# Patient Record
Sex: Male | Born: 1960
Health system: Southern US, Community
[De-identification: ages and names within clinical notes are randomized; demographics above are authoritative.]

## PROBLEM LIST (undated history)

## (undated) DIAGNOSIS — G839 Paralytic syndrome, unspecified: Secondary | ICD-10-CM

## (undated) DIAGNOSIS — I639 Cerebral infarction, unspecified: Secondary | ICD-10-CM

## (undated) DIAGNOSIS — I10 Essential (primary) hypertension: Secondary | ICD-10-CM

## (undated) DIAGNOSIS — E7800 Pure hypercholesterolemia, unspecified: Secondary | ICD-10-CM

## (undated) DIAGNOSIS — E119 Type 2 diabetes mellitus without complications: Secondary | ICD-10-CM

## (undated) DIAGNOSIS — I6529 Occlusion and stenosis of unspecified carotid artery: Secondary | ICD-10-CM

## (undated) HISTORY — DX: Pure hypercholesterolemia, unspecified: E78.00

## (undated) HISTORY — DX: Cerebral infarction, unspecified: I63.9

## (undated) HISTORY — DX: Type 2 diabetes mellitus without complications: E11.9

## (undated) HISTORY — DX: Essential (primary) hypertension: I10

## (undated) HISTORY — PX: TONSILLECTOMY: SUR1361

## (undated) HISTORY — DX: Occlusion and stenosis of unspecified carotid artery: I65.29

## (undated) NOTE — *Deleted (*Deleted)
Physician Discharge Summary  Patient ID: Lain Tetterton MRN: [id number] DOB/AGE: [birth year] 82 y.o.  Admit date: 11/21/2019 Discharge date: 12/15/2019  Discharge Diagnoses:  Principal Problem:   Left middle cerebral artery stroke [facility]) Active Problems:   Malnutrition of moderate degree   Tracheostomy dependence (HCC) DVT prophylaxis Pain management Dysphagia Hypertension Hyperlipidemia Acute on chronic respiratory failure status post tracheostomy Urinary retention Constipation Enterococcus UTI Diabetes mellitus  Discharged Condition: Stable  Significant Diagnostic Studies: DG Chest 2 View  Result Date: 12/09/2019 CLINICAL DATA:  CVA with concern for potential aspiration EXAM: CHEST - 2 VIEW COMPARISON:  November 13, 2019 FINDINGS: Tracheostomy no longer appreciable. Lungs are clear. Heart size and pulmonary vascularity are normal. No adenopathy. No bone lesions. IMPRESSION: Lungs clear.  Cardiac silhouette normal. Electronically Signed   By: Bretta Bang III M.D.   On: 12/09/2019 08:32   DG Abd 1 View  Result Date: 12/09/2019 CLINICAL DATA:  Nausea and vomiting. EXAM: ABDOMEN - 1 VIEW COMPARISON:  11/11/2019 FINDINGS: There is no evidence for gaseous bowel dilation to suggest obstruction. Gastrostomy tube again noted. Gas in the medial right upper quadrant is likely in the distal stomach/duodenum, but free air under the liver on a supine film could have this appearance. Prominent stool volume. Visualized bony anatomy unremarkable. IMPRESSION: 1. Gas in the medial right upper quadrant likely in the distal stomach/duodenum although free air under the liver on a supine film could have this appearance. CT scan of the abdomen and pelvis could be performed to further evaluate as clinically warranted. 2. Prominent left colonic stool volume. Electronically Signed   By: Kennith Center M.D.   On: 12/09/2019 06:29   DG Chest Port 1 View  Result Date: 12/13/2019 CLINICAL DATA:  66 year old  male with acute respiratory distress. EXAM: PORTABLE CHEST 1 VIEW COMPARISON:  Chest radiograph dated 12/09/2019. FINDINGS: Mild diffuse interstitial coarsening may be chronic or mild edema. No focal consolidation, pleural effusion, pneumothorax. The cardiac silhouette is within limits. Atherosclerotic calcification of the aortic arch. No acute osseous pathology. IMPRESSION: No focal consolidation. Electronically Signed   By: Elgie Collard M.D.   On: 12/13/2019 18:07    Labs:  Basic Metabolic Panel: Recent Labs  Lab 12/09/19 0758 12/11/19 1322 12/12/19 0545 12/14/19 0643  NA 144 141 137 137  K 3.6 3.4* 3.1* 3.4*  CL 103 101 100 101  CO2 28 29 27 26   GLUCOSE 175* 153* 108* 100*  BUN 41* 41* 35* 25*  CREATININE 1.75* 1.50* 1.27* 1.14  CALCIUM 9.1 8.6* 8.4* 8.5*    CBC: Recent Labs  Lab 12/09/19 0758 12/10/19 0607  WBC 11.5* 7.5  HGB 8.9* 9.4*  HCT 27.7* 28.7*  MCV 93.3 94.1  PLT 220 217    CBG: Recent Labs  Lab 12/14/19 1550 12/14/19 1838 12/14/19 2030 12/14/19 2358 12/15/19 0426  GLUCAP 109* 148* 166* 173* 111*    Brief HPI:   Joss Mcdill is a 25 y.o. right-handed male with history of diabetes mellitus hypertension hyperlipidemia left MCA infarction April 2021 while living in May 2021 underwent rehabilitation at [facility] with residual aphasia and right-sided weakness as well as left CEA July 2021 who resides presently assisted living facility.  Presented 10/10/2019 with seizure-like activity requiring intubation.  CT and MRI showed large territory chronic hemorrhage infarct in the left frontal lobe.  Ill-defined hyperintensities throughout the left basal ganglia and thalamus.  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  Patient maintained on aspirin and Plavix for CVA prophylaxis.  Keppra Dilantin and Vimpat ordered for seizure disorder.   Patient did initially require intubation extubated 10/14/2019 however continued to have desaturations into the 80s 15 L nonrebreather mask requiring reintubation and tracheostomy performed 10/22/2019 presently with a #6 cuffless trach as of 11/02/2019.  Currently n.p.o. gastrostomy tube placed 11/10/2019 per Dr. Loreta Ave of interventional radiology.  Bouts of urine retention requiring Urecholine.  Therapy evaluations completed and patient was admitted for a comprehensive rehab program   Hospital Course: Rhylen Shaheen was admitted to rehab 11/21/2019 for inpatient therapies to consist of PT, ST and OT at least three hours five days a week. Past admission physiatrist, therapy team and rehab RN have worked together to provide customized collaborative inpatient rehab.  Pertaining to patient's left MCA distribution infarction as well as history of left MCA remained stable he continued on aspirin and Plavix therapy.  Patient would need follow-up neurology services [place]  SCDs for DVT prophylaxis.  Pain managed with use of scheduled baclofen for spasticity as well as Neurontin every 8 hours.  His baclofen was later discontinued due to some sedation.  Seizure prophylaxis with the use of Dilantin, Keppra 1000 mg twice daily and Vimpat 150 mg twice daily.  There was an episode of lethargy noted 12/13/2019 patient did respond to sternal rub there was some question of possible seizure his Dilantin was adjusted.Marland Kitchen  Dysphagia n.p.o. gastrostomy tube 11/10/2019 per interventional radiology patient tolerating bolus tube feeds after adjustments due to some bouts of nausea with constipation and the addition of Reglan.  Blood pressure controlled with Cardura 6 mg daily as well as Norvasc 10 mg daily and hydralazine 50 mg every 8 hours.  Mood stabilization with scheduled Lexapro 5 mg nightly as well as Seroquel 50 mg nightly and Xanax 0.25 mg 3 times daily as needed.  Hospital course bouts of urinary retention  improved he did continue on Urecholine he was also being treated for Enterococcus UTI completing course of Amoxil and remained on in and out catheterizations as needed keep volumes less than 500 cc.  Acute on chronic respiratory failure tracheostomy decannulated tolerating well monitoring oxygen saturations.  Lipitor for hyperlipidemia.  Blood sugars controlled continue on NovoLog 6 times daily as well as scheduled Lantus insulin with hemoglobin A1c 7.2   Blood pressures were monitored on TID basis and soft and monitored  Diabetes has been monitored with ac/hs CBG checks and SSI was use prn for tighter BS control.    Rehab course: During patient's stay in rehab weekly team conferences were held to monitor patient's progress, set goals and discuss barriers to discharge. At admission, patient required min mod assist 90 feet to person hand-held assist moderate assist sit to stand.  Moderate assist upper body bathing mod assist lower body bathing mod assist upper body dressing moderate assist lower body dressing  Physical exam.  Blood pressure 153/64 pulse 84 temperature 98 respirations 17 oxygen saturations 100% room air Constitutional.  No acute distress HEENT Head.  Normocephalic and atraumatic Cardiac regular rate rhythm without extra sounds or murmur heard Neck.  Supple nontender no JVD trach stoma site healing Respiratory effort normal no respiratory distress without wheeze Abdomen.  Soft nontender positive bowel sounds gastrostomy tube in place Neurologic.  Cranial nerves II through XII intact motor strength 3/5 right deltoid bicep tricep grip hip flexor knee extensor ankle dorsi flexion 5/5 in left deltoid bicep tricep  grip hip flexors knee extensors ankle dorsi plantarflexion.  He/  has had improvement in activity tolerance, balance, postural control as well as ability to compensate for deficits. He/ has had improvement in functional use RUE/LUE  and RLE/LLE as well as improvement in  awareness.  Supine to sit with supervision.  Patient doffed right hand splint provides max assist for donning paper scrub pants and top socks shoes and AFO.  Patient provided cueing for compensatory dressing methods.  Demonstrates posterior loss of balance sitting and standing without lateral upper extremity support.  Ambulate 7 feet rolling walker.  He can increase his ambulation up to 50 feet.  Patient demonstrates good foot clearance.  He needs assist for ADLs and monitoring of balance.  Patient was discharged out of state to be closer with family       Disposition: Discharged to outside facility    Diet:NPO. Osmolite 237 mL 6 times daily by tube Free water 150 mL every 4 hours per tube  Medications at discharge 1.  Tylenol as needed 2.  Xanax 0.25 mg 3 times daily by tube as needed 3.  Norvasc 10 mg daily by tube 4.  Aspirin 81 mg p.o. daily by tube 5.  Lipitor 80 mg p.o. daily by tube 8.  Urecholine 50 mg 3 times daily by tube 9.  Amoxil 500 mg every 8 hours completing course 10.  Plavix 75 mg daily by tube 11.  Cardura 6 mg daily by tube 12.  Lexapro 5 mg daily nightly by tube 13.  Pepcid 10 mg daily by tube 14.  Neurontin 200 mg every 8 hours by tube 15.  Hydralazine 50 mg every 6 hours by tube 16.  NovoLog 3 units 6 times daily 17.  Lantus insulin 5 units twice daily 18.  Vimpat 150 mg twice daily by tube 19.  Keppra 1000 mg twice daily by tube 20.  Reglan 5 mg every 8 hours by tube 21.  Dilantin 125 mg / 5 mL suspension 15 mL (375mg ) and 8 mL(200mg ) in the evening 22.  Seroquel 50 mg nightly by tube 23.  Scopolamine patch change every 72 hours 24.  Senokot 5 mL twice daily by tube 25.KDUR 10 meq daily per tube  30-35 minutes were spent completing discharge summary and discharge planning  Special Instructions:  Discharge Instructions    Ambulatory referral to Neurology   Complete by: As directed    An appointment is requested in approximately 4 weeks left MCA  distribution infarction       Follow-up Information    , MD Follow up.   Specialty: Physical Medicine and Rehabilitation Why: No follow-up needed as patient is leaving the area on discharge Contact information: [address] [place] [phone]        [phone], MD Follow up.   Specialty: Pulmonary Disease Why: Call for appointment Contact information: [address] [place] [phone]        [phone], DO Follow up.   Specialties: Interventional Radiology, Radiology Why: Call for appointment Contact information: [address] [place] [address]               Signed: [phone] Nimco Bivens 12/15/2019, 5:07 AM

## (undated) NOTE — *Deleted (*Deleted)
Resting in bed in no acute distress, denies pain

## (undated) NOTE — *Deleted (*Deleted)
Physician Discharge Summary  Patient ID: Corey Bennett MRN: [id number] DOB/AGE: [birth year] 48 y.o.  Admit date: 11/21/2019 Discharge date: 12/15/2019  Discharge Diagnoses:  Principal Problem:   Left middle cerebral artery stroke [facility]) Active Problems:   Malnutrition of moderate degree   Tracheostomy dependence (HCC) DVT prophylaxis Pain management Dysphagia Hypertension Hyperlipidemia Acute on chronic respiratory failure status post tracheostomy Urinary retention Constipation Enterococcus UTI Diabetes mellitus  Discharged Condition: Stable  Significant Diagnostic Studies: DG Chest 2 View  Result Date: 12/09/2019 CLINICAL DATA:  CVA with concern for potential aspiration EXAM: CHEST - 2 VIEW COMPARISON:  November 13, 2019 FINDINGS: Tracheostomy no longer appreciable. Lungs are clear. Heart size and pulmonary vascularity are normal. No adenopathy. No bone lesions. IMPRESSION: Lungs clear.  Cardiac silhouette normal. Electronically Signed   By: Bretta Bang III M.D.   On: 12/09/2019 08:32   DG Abd 1 View  Result Date: 12/09/2019 CLINICAL DATA:  Nausea and vomiting. EXAM: ABDOMEN - 1 VIEW COMPARISON:  11/11/2019 FINDINGS: There is no evidence for gaseous bowel dilation to suggest obstruction. Gastrostomy tube again noted. Gas in the medial right upper quadrant is likely in the distal stomach/duodenum, but free air under the liver on a supine film could have this appearance. Prominent stool volume. Visualized bony anatomy unremarkable. IMPRESSION: 1. Gas in the medial right upper quadrant likely in the distal stomach/duodenum although free air under the liver on a supine film could have this appearance. CT scan of the abdomen and pelvis could be performed to further evaluate as clinically warranted. 2. Prominent left colonic stool volume. Electronically Signed   By: Kennith Center M.D.   On: 12/09/2019 06:29   DG Chest Port 1 View  Result Date: 12/13/2019 CLINICAL DATA:  73 year old  male with acute respiratory distress. EXAM: PORTABLE CHEST 1 VIEW COMPARISON:  Chest radiograph dated 12/09/2019. FINDINGS: Mild diffuse interstitial coarsening may be chronic or mild edema. No focal consolidation, pleural effusion, pneumothorax. The cardiac silhouette is within limits. Atherosclerotic calcification of the aortic arch. No acute osseous pathology. IMPRESSION: No focal consolidation. Electronically Signed   By: Elgie Collard M.D.   On: 12/13/2019 18:07    Labs:  Basic Metabolic Panel: Recent Labs  Lab 12/09/19 0758 12/11/19 1322 12/12/19 0545 12/14/19 0643  NA 144 141 137 137  K 3.6 3.4* 3.1* 3.4*  CL 103 101 100 101  CO2 28 29 27 26   GLUCOSE 175* 153* 108* 100*  BUN 41* 41* 35* 25*  CREATININE 1.75* 1.50* 1.27* 1.14  CALCIUM 9.1 8.6* 8.4* 8.5*    CBC: Recent Labs  Lab 12/09/19 0758 12/10/19 0607  WBC 11.5* 7.5  HGB 8.9* 9.4*  HCT 27.7* 28.7*  MCV 93.3 94.1  PLT 220 217    CBG: Recent Labs  Lab 12/13/19 1133 12/13/19 1645 12/13/19 2011 12/13/19 2334 12/14/19 0424  GLUCAP 119* 193* 176* 198* 90    Brief HPI:   Corey Bennett is a 49 y.o. right-handed male with history of diabetes mellitus hypertension hyperlipidemia left MCA infarction April 2021 while living in [place] underwent rehabilitation at [facility] with residual aphasia and right-sided weakness as well as left CEA July 2021 who resides presently assisted living facility.  Presented 10/10/2019 with seizure-like activity requiring intubation.  CT and MRI showed large territory chronic hemorrhage infarct in the left frontal lobe.  Ill-defined hyperintensities throughout the left basal ganglia and thalamus.  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  Patient maintained on aspirin and Plavix for CVA prophylaxis.  Keppra Dilantin and Vimpat ordered for seizure disorder.   Patient did initially require intubation extubated 10/14/2019 however continued to have desaturations into the 80s 15 L nonrebreather mask requiring reintubation and tracheostomy performed 10/22/2019 presently with a #6 cuffless trach as of 11/02/2019.  Currently n.p.o. gastrostomy tube placed 11/10/2019 per Dr. Loreta Ave of interventional radiology.  Bouts of urine retention requiring Urecholine.  Therapy evaluations completed and patient was admitted for a comprehensive rehab program   Hospital Course: Corey Bennett was admitted to rehab 11/21/2019 for inpatient therapies to consist of PT, ST and OT at least three hours five days a week. Past admission physiatrist, therapy team and rehab RN have worked together to provide customized collaborative inpatient rehab.  Pertaining to patient's left MCA distribution infarction as well as history of left MCA remained stable he continued on aspirin and Plavix therapy.  Patient would need follow-up neurology services once at [place].  SCDs for DVT prophylaxis.  Pain managed with use of scheduled baclofen for spasticity as well as Neurontin every 8 hours.  His baclofen was later discontinued due to some sedation.  Seizure prophylaxis with the use of Dilantin, Keppra 1000 mg twice daily and Vimpat 150 mg twice daily.  There was an episode of lethargy noted 12/13/2019 patient did respond to sternal rub there was some question of possible seizure his Dilantin was adjusted.Marland Kitchen  Dysphagia n.p.o. gastrostomy tube 11/10/2019 per interventional radiology patient tolerating bolus tube feeds after adjustments due to some bouts of nausea with constipation and the addition of Reglan.  Blood pressure controlled with Cardura 6 mg daily as well as Norvasc 10 mg daily and hydralazine 50 mg every 8 hours.  Mood stabilization with scheduled Lexapro 5 mg nightly as well as Seroquel 50 mg nightly and Xanax 0.25 mg 3 times daily as needed.  Hospital course bouts of urinary retention  improved he did continue on Urecholine he was also being treated for Enterococcus UTI completing course of Amoxil and remained on in and out catheterizations as needed keep volumes less than 500 cc.  Acute on chronic respiratory failure tracheostomy decannulated tolerating well monitoring oxygen saturations.  Lipitor for hyperlipidemia.  Blood sugars controlled continue on NovoLog 6 times daily as well as scheduled Lantus insulin with hemoglobin A1c 7.2   Blood pressures were monitored on TID basis and soft and monitored  Diabetes has been monitored with ac/hs CBG checks and SSI was use prn for tighter BS control.    Rehab course: During patient's stay in rehab weekly team conferences were held to monitor patient's progress, set goals and discuss barriers to discharge. At admission, patient required min mod assist 90 feet to person hand-held assist moderate assist sit to stand.  Moderate assist upper body bathing mod assist lower body bathing mod assist upper body dressing moderate assist lower body dressing  Physical exam.  Blood pressure 153/64 pulse 84 temperature 98 respirations 17 oxygen saturations 100% room air Constitutional.  No acute distress HEENT Head.  Normocephalic and atraumatic Cardiac regular rate rhythm without extra sounds or murmur heard Neck.  Supple nontender no JVD trach stoma site healing Respiratory effort normal no respiratory distress without wheeze Abdomen.  Soft nontender positive bowel sounds gastrostomy tube in place Neurologic.  Cranial nerves II through XII intact motor strength 3/5 right deltoid bicep tricep grip hip flexor knee extensor ankle dorsi flexion 5/5 in left deltoid bicep tricep  grip hip flexors knee extensors ankle dorsi plantarflexion.  He/  has had improvement in activity tolerance, balance, postural control as well as ability to compensate for deficits. He/ has had improvement in functional use RUE/LUE  and RLE/LLE as well as improvement in  awareness.  Supine to sit with supervision.  Patient doffed right hand splint provides max assist for donning paper scrub pants and top socks shoes and AFO.  Patient provided cueing for compensatory dressing methods.  Demonstrates posterior loss of balance sitting and standing without lateral upper extremity support.  Ambulate 7 feet rolling walker.  He can increase his ambulation up to 50 feet.  Patient demonstrates good foot clearance.  He needs assist for ADLs and monitoring of balance.  Patient was discharged out of state to be closer with family       Disposition: Discharged to outside facility    Diet:NPO. Osmolite 237 mL 6 times daily by tube Free water 150 mL every 4 hours per tube  Medications at discharge 1.  Tylenol as needed 2.  Xanax 0.25 mg 3 times daily by tube as needed 3.  Norvasc 10 mg daily by tube 4.  Aspirin 81 mg p.o. daily by tube 5.  Lipitor 80 mg p.o. daily by tube 8.  Urecholine 50 mg 3 times daily by tube 9.  Amoxil 500 mg every 8 hours completing course 10.  Plavix 75 mg daily by tube 11.  Cardura 6 mg daily by tube 12.  Lexapro 5 mg daily nightly by tube 13.  Pepcid 10 mg daily by tube 14.  Neurontin 200 mg every 8 hours by tube 15.  Hydralazine 50 mg every 6 hours by tube 16.  NovoLog 3 units 6 times daily 17.  Lantus insulin 5 units twice daily 18.  Vimpat 150 mg twice daily by tube 19.  Keppra 1000 mg twice daily by tube 20.  Reglan 5 mg every 8 hours by tube 21.  Dilantin 200 mg twice daily and 175 mg daily at 1200-hour 22.  Seroquel 50 mg nightly by tube 23.  Scopolamine patch change every 72 hours 24.  Senokot 5 mL twice daily by tube  30-35 minutes were spent completing discharge summary and discharge planning  Special Instructions:  Discharge Instructions    Ambulatory referral to Neurology   Complete by: As directed    An appointment is requested in approximately 4 weeks left MCA distribution infarction       Follow-up Information     Ranelle Oyster, MD Follow up.   Specialty: Physical Medicine and Rehabilitation Why: No follow-up needed as patient is leaving the area on discharge Contact information: [address] [place] [address] [phone]        Lorin Glass, MD Follow up.   Specialty: Pulmonary Disease Why: Call for appointment Contact information: [address] [place] [address] [phone]        Gilmer Mor, DO Follow up.   Specialties: Interventional Radiology, Radiology Why: Call for appointment Contact information: [address] [place] [address] [phone]               Signed: Mcarthur Rossetti Lulabelle Desta 12/14/2019, 7:48 AM

---

## 2019-08-17 ENCOUNTER — Other Ambulatory Visit (HOSPITAL_COMMUNITY): Payer: Self-pay | Admitting: Internal Medicine

## 2019-08-17 DIAGNOSIS — R0989 Other specified symptoms and signs involving the circulatory and respiratory systems: Secondary | ICD-10-CM

## 2019-08-21 ENCOUNTER — Ambulatory Visit: Payer: BC Managed Care – PPO | Attending: Internal Medicine

## 2019-08-21 ENCOUNTER — Other Ambulatory Visit: Payer: Self-pay

## 2019-08-21 ENCOUNTER — Ambulatory Visit (HOSPITAL_COMMUNITY)
Admission: RE | Admit: 2019-08-21 | Discharge: 2019-08-21 | Disposition: A | Discharge status: Home / Self Care | Payer: BC Managed Care – PPO | Source: Ambulatory Visit | Attending: Nurse Practitioner | Admitting: Nurse Practitioner

## 2019-08-21 DIAGNOSIS — R2681 Unsteadiness on feet: Secondary | ICD-10-CM | POA: Diagnosis present

## 2019-08-21 DIAGNOSIS — M6281 Muscle weakness (generalized): Secondary | ICD-10-CM | POA: Insufficient documentation

## 2019-08-21 DIAGNOSIS — I69351 Hemiplegia and hemiparesis following cerebral infarction affecting right dominant side: Secondary | ICD-10-CM | POA: Insufficient documentation

## 2019-08-21 DIAGNOSIS — R0989 Other specified symptoms and signs involving the circulatory and respiratory systems: Secondary | ICD-10-CM | POA: Diagnosis not present

## 2019-08-21 NOTE — Therapy (Signed)
[facility] [address] [place], [place], [address] Phone: [phone]   Fax:  [phone]  Physical Therapy Evaluation  Patient Details  Name: Corey Bennett MRN: [id number] Date of Birth: [birth year] Referring Provider (PT): Lorene Dy, MD   Encounter Date: 08/21/2019   PT End of Session - 08/21/19 1539    Visit Number 1    Number of Visits 1    Authorization Type BCBS    PT Start Time 1400    PT Stop Time 9417    PT Time Calculation (min) 53 min    Equipment Utilized During Treatment Gait belt    Activity Tolerance Patient tolerated treatment well    Behavior During Therapy Flat affect           Past Medical History:  Diagnosis Date   Diabetes ([place])    Hypercholesterolemia    Hypertension     History reviewed. No pertinent surgical history.  There were no vitals filed for this visit.    Subjective Assessment - 08/21/19 1401    Subjective Brother reports that they believe the stroke occured on 4/23. Work sent a care check up and found him at home, was admitted to the hospital on 4/25 with Left MCA Stroke. Admitted to inpatient rehab on 07/03/19 at [facility]. Brother reports that he is living in a ALF in [place] due to mother unable to take care of patient at this time. Has been at the ALF for approx 3 weeks and has recieved therapy services. Brother reports that biggest concern is coomunication issues and R Upper extremity weakness at this time.    Pertinent History HTN, Diabtes, Cholesterol    Limitations Walking    Patient Stated Goals Patient unable to report goals to PT    Currently in Pain? No/denies              [facility] PT Assessment - 08/21/19 0001      Assessment   Medical Diagnosis Left MCA CVA    Referring Provider (PT) Lorene Dy, MD    Onset Date/Surgical Date 06/26/19   date of CVA per brother report   Hand Dominance Right    Prior  Therapy inpatient rehab, therpay services at ALF      Precautions   Precautions Fall      Restrictions   Weight Bearing Restrictions No      Balance Screen   Has the patient fallen in the past 6 months Yes    How many times? 2    Has the patient had a decrease in activity level because of a fear of falling?  No    Is the patient reluctant to leave their home because of a fear of falling?  No      Home Environment   Living Environment Assisted living    Home Equipment None    Additional Comments ALF, Recieve Help Bathing/Dressing      Prior Function   Level of Independence Independent    Vocation Full time employment    Loss adjuster, chartered    Leisure Read, Watch TV      Cognition   Overall Cognitive Status Impaired/Different from baseline    Behaviors Other (comment)      Sensation   Light Touch Appears Intact    Additional Comments appears intact per patient reports      Coordination   Gross Motor Movements are Fluid and Coordinated No  ROM / Strength   AROM / PROM / Strength Strength      Strength   Overall Strength Deficits    Overall Strength Comments for BLE's    Strength Assessment Site Hip;Knee;Ankle    Right/Left Hip Right;Left    Right Hip Flexion 4-/5    Right Hip ABduction 4+/5    Right Hip ADduction 4+/5    Left Hip Flexion 4+/5    Left Hip ABduction 5/5    Left Hip ADduction 5/5    Right/Left Knee Right;Left    Right Knee Flexion 4-/5    Right Knee Extension 4/5    Left Knee Flexion 4+/5    Left Knee Extension 4+/5    Right/Left Ankle Right;Left    Right Ankle Dorsiflexion 4-/5    Left Ankle Dorsiflexion 4+/5      Bed Mobility   Bed Mobility Rolling Right;Rolling Left;Supine to Sit;Sit to Supine    Rolling Right Independent    Rolling Left Independent    Supine to Sit Independent    Sit to Supine Independent      Transfers   Transfers Sit to Stand;Stand to Sit    Sit to Stand 5: Supervision    Five time sit to  stand comments  completed in 18.35 secs    Stand to Sit 5: Supervision      Ambulation/Gait   Ambulation/Gait Yes    Ambulation/Gait Assistance 5: Supervision    Ambulation/Gait Assistance Details supv for safety. Slight impulsivity noted with turns    Ambulation Distance (Feet) 125 Feet    Assistive device None    Gait Pattern Step-through pattern    Ambulation Surface Level;Indoor    Gait velocity 11.07 secs = 2.96 ft/sec      High Level Balance   High Level Balance Activites Other (comment)    High Level Balance Comments Completed M-CTSIB, situation 1 - 30 secs, situation 2 - 30 secs, situation 3 - 30 secs, situation 4 - 10 secs. CGA required with getting on airex pad due to impulsivity                      Objective measurements completed on examination: See above findings.       [facility] Adult PT Treatment/Exercise - 08/21/19 0001      Exercises   Exercises --    Other Exercises  --                  PT Education - 08/21/19 1536    Education Details Educated on Evaluation Findings, HEP, and Walking Program    [employer]) Educated Patient;Other (comment)   Brother (Corey Bennett)   Methods Explanation;Demonstration;Handout    Comprehension Verbalized understanding;Returned demonstration                       Plan - 08/21/19 1553    Clinical Impression Statement Pt. is a 59 y.o. male that was referred to Neuro OPPT services due to Left MCA CVA. Patient presents with R side weakness, flaccid RUE, and global aphasia. Patient is currently residing in an ALF facility where the patient has received therapy services. Patients brother would like for patient to receive services at this facility due to the intensity and frequency of services offered. Patient demo ability to answer yes/no questions throughout session. Upon evaluation, patient demonstrates some increased impulsivity with gait, but overall, at supervision level for functional mobility. Patient is  currently ambulating at 2.96 ft/sec, demonstrating community  ambulator level. Due to visit limit, patient and brother would like to focus primarily on occupational and speech therapy services at this time. PT educated on HEP provided (strength/balance exercises) and walking program. Educated on proper completion of these activities and ensuring that someone is present with patient for improved safety.    Personal Factors and Comorbidities Comorbidity 2    Comorbidities HTN, Diabetes, High Cholesterol    Examination-Activity Limitations Bathing;Dressing    Examination-Participation Restrictions Driving;Community Activity    Stability/Clinical Decision Making Evolving/Moderate complexity    Clinical Decision Making Moderate    Recommended Other Services OT and ST    Consulted and Agree with Plan of Care Patient;Family member/caregiver    Family Member Consulted Brother (Corey Bennett)           Patient will benefit from skilled therapeutic intervention in order to improve the following deficits and impairments:  Abnormal gait, Decreased balance, Difficulty walking, Impaired UE functional use, Decreased safety awareness, Decreased strength, Decreased coordination  Visit Diagnosis: Hemiplegia and hemiparesis following cerebral infarction affecting right dominant side (HCC)  Unsteadiness on feet  Muscle weakness (generalized)     Problem List There are no problems to display for this patient.   Tempie Donning, PT, DPT 08/21/2019, 4:05 PM  [facility] [address] [place], [place], [address] Phone: [phone]   Fax:  [phone]  Name: Raynald Rouillard MRN: [id number] Date of Birth: 10-02-1960

## 2019-08-21 NOTE — Patient Instructions (Signed)
Educated on HEP to allow for improvements in strength (Sit to [employer] and [employer] with Countertop Support, 2 x 10 reps each). Also educated on balance exercises (Romberg on Foam, and Head Turns with Romberg on Firm surface, 3 x 30 secs each). PT providing education to patient and brother to have someone with patient during completion of these exercises for improved safety. Also educated on proper completion of balance exercises in a corner for safety. Educated patient and patient's brother on ensuring that he is completing walking program with supervision, completing for approx.  5- 10 minutes and educated on proper progression. Brother and patient verbalizes understanding.

## 2019-08-21 NOTE — Progress Notes (Signed)
Carotid duplex  has been completed. Refer to [facility] under chart review to view preliminary results.  Positive results given to Dr. Su Hilt.  08/21/2019  3:49 PM Corey Bennett, Corey Bennett

## 2019-08-26 ENCOUNTER — Encounter: Payer: Self-pay | Admitting: Vascular Surgery

## 2019-08-26 ENCOUNTER — Ambulatory Visit: Payer: BC Managed Care – PPO | Admitting: Vascular Surgery

## 2019-08-26 ENCOUNTER — Other Ambulatory Visit: Payer: Self-pay

## 2019-08-26 VITALS — BP 100/64 | HR 75 | Temp 98.0°F | Resp 20 | Ht 73.0 in | Wt 161.0 lb

## 2019-08-26 DIAGNOSIS — I6522 Occlusion and stenosis of left carotid artery: Secondary | ICD-10-CM

## 2019-08-26 NOTE — H&P (View-Only) (Signed)
REASON FOR CONSULT:    Left carotid stenosis.  The consult is requested by Dr. Burton Apley.  ASSESSMENT & PLAN:   SYMPTOMATIC LEFT CAROTID STENOSIS: This patient had a left brain stroke in April of this year while in [place] and has a severe left carotid stenosis.  He has now moved to [place] to be with family.  I would recommend left carotid endarterectomy in order to lower his risk of future stroke.  I explained that given he has had a stroke and given the severity of the stenosis his risk of stroke without surgery is 10 %/year.  We can significantly lower his risk with surgery.  I have it reviewed the indications for carotid endarterectomy and the potential complications including but not limited to perioperative stroke (1 to 2% risk), MI, nerve injury, or other unpredictable medical problems.  Patient is agreeable to proceed and his surgery has been scheduled for 09/08/2019.  He is on aspirin and is on a statin.  We will hold his Plavix for 7 days preoperatively.  I also discussed with him the importance of tobacco cessation.   Waverly Ferrari, MD Office: [phone]   HPI:   Corey Bennett is a pleasant 59 y.o. male, referred with a left carotid stenosis.  I did find a note from outpatient rehab.  This note documents that the brother believes that the stroke occurred on 06/26/2019.  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  He was admitted to inpatient rehab on 07/03/2019 at [facility].  In speaking with the patient's brother who is with him today he had a stroke in [place] on 06/26/2019 associated with significant right-sided weakness and expressive aphasia.  Thus the history is largely obtained from his brother.  He was in the hospital and found to have a left carotid stenosis.  He was felt that he would require left carotid endarterectomy but that he needed to stabilize first.  He subsequently has  moved to [facility] to be closer to family and has been getting some therapy.  His strength has improved slightly in the right upper extremity.  He is ambulatory some.  He still has a fairly profound expressive aphasia.  He is on aspirin, Plavix, and a statin.  He denies any significant cardiac history.  Specifically he denies chest pain or chest pressure.  He denies any history of myocardial infarction or history of congestive heart failure.  His risk factors for peripheral vascular disease include diabetes, hypertension, hypercholesterolemia, a family history of premature cardiovascular disease, and tobacco use.  He smoked 1 pack/day for 40 to 50 years.  Past Medical History:  Diagnosis Date  . Carotid artery occlusion   . Diabetes (HCC)   . Hypercholesterolemia   . Hypertension   . Stroke [facility])     History reviewed. No pertinent family history.  SOCIAL HISTORY: Social History   Socioeconomic History  . Marital status: Single    Spouse name: Not on file  . Number of children: Not on file  . Years of education: Not on file  . Highest education level: Not on file  Occupational History  . Not on file  Tobacco Use  . Smoking status: Current Every Day Smoker    Packs/day: 1.00    Types: Cigarettes  . Smokeless tobacco: Never Used  Vaping Use  . Vaping Use: Never used  Substance and Sexual Activity  . Alcohol use: Yes  .  Drug use: Not on file  . Sexual activity: Not on file  Other Topics Concern  . Not on file  Social History Narrative  . Not on file   Social Determinants of Health   Financial Resource Strain:   . Difficulty of Paying Living Expenses:   Food Insecurity:   . Worried About Programme researcher, broadcasting/film/video in the Last Year:   . Barista in the Last Year:   Transportation Needs:   . Freight forwarder (Medical):   Marland Kitchen Lack of Transportation (Non-Medical):   Physical Activity:   . Days of Exercise per Week:   . Minutes of Exercise per Session:   Stress:    . Feeling of Stress :   Social Connections:   . Frequency of Communication with Friends and Family:   . Frequency of Social Gatherings with Friends and Family:   . Attends Religious Services:   . Active Member of Clubs or Organizations:   . Attends Banker Meetings:   Marland Kitchen Marital Status:   Intimate Partner Violence:   . Fear of Current or Ex-Partner:   . Emotionally Abused:   Marland Kitchen Physically Abused:   . Sexually Abused:     No Known Allergies  Current Outpatient Medications  Medication Sig Dispense Refill  . amLODipine (NORVASC) 10 MG tablet Take 10 mg by mouth daily.    Marland Kitchen aspirin EC 81 MG tablet Take 81 mg by mouth daily.    Marland Kitchen atorvastatin (LIPITOR) 80 MG tablet Take 80 mg by mouth daily.    . cholecalciferol (VITAMIN D3) 10 MCG (400 UNIT) TABS tablet Take 5,000 Units by mouth daily. Vitamin D3 5000 intl unites (125 mcg)    . clopidogrel (PLAVIX) 75 MG tablet Take 75 mg by mouth daily.    . insulin glargine (LANTUS) 100 UNIT/ML injection Inject 12 Units into the skin at bedtime.    . insulin lispro (HUMALOG) 100 UNIT/ML injection Inject 5 Units into the skin 3 (three) times daily before meals.    Marland Kitchen losartan (COZAAR) 25 MG tablet Take 25 mg by mouth daily.    . metFORMIN (GLUCOPHAGE) 1000 MG tablet Take 1,000 mg by mouth 2 (two) times daily with a meal.    . polyethylene glycol (MIRALAX / GLYCOLAX) 17 g packet Take 17 g by mouth as needed.    . senna (SENOKOT) 8.6 MG TABS tablet Take 1 tablet by mouth as needed.    . tamsulosin (FLOMAX) 0.4 MG CAPS capsule Take 0.4 mg by mouth at bedtime.     No current facility-administered medications for this visit.    REVIEW OF SYSTEMS:  [X]  denotes positive finding, [ ]  denotes negative finding Cardiac  Comments:  Chest pain or chest pressure:    Shortness of breath upon exertion: x   Short of breath when lying flat:    Irregular heart rhythm: x       Vascular    Pain in calf, thigh, or hip brought on by ambulation:    Pain  in feet at night that wakes you up from your sleep:     Blood clot in your veins:    Leg swelling:         Pulmonary    Oxygen at home:    Productive cough:     Wheezing:         Neurologic    Sudden weakness in arms or legs:  x   Sudden numbness in arms or legs:  Sudden onset of difficulty speaking or slurred speech: x   Temporary loss of vision in one eye:     Problems with dizziness:         Gastrointestinal    Blood in stool:     Vomited blood:         Genitourinary    Burning when urinating:     Blood in urine:        Psychiatric    Major depression:         Hematologic    Bleeding problems:    Problems with blood clotting too easily:        Skin    Rashes or ulcers:        Constitutional    Fever or chills:     PHYSICAL EXAM:   Vitals:   08/26/19 1039 08/26/19 1049  BP: 98/61 100/64  Pulse: 75   Resp: 20   Temp: 98 F (36.7 C)   SpO2: 98%   Weight: 161 lb (73 kg)   Height: 6\' 1"  (1.854 m)     GENERAL: The patient is a well-nourished male, in no acute distress. The vital signs are documented above. CARDIAC: There is a regular rate and rhythm.  VASCULAR: I do not detect carotid bruits. I cannot palpate pedal pulses however he has a brisk anterior tibial and posterior tibial signal on the right and a brisk dorsalis pedis and posterior tibial signal on the left. PULMONARY: There is good air exchange bilaterally without wheezing or rales. ABDOMEN: Soft and non-tender with normal pitched bowel sounds.  His aorta is palpable as he is very thin but does not feel aneurysmal. MUSCULOSKELETAL: There are no major deformities or cyanosis. NEUROLOGIC: He has fairly profound right upper extremity weakness with very weak grip and very weak abductors.  Biceps strength and triceps strength are diminished.  He has reasonable strength in the right lower extremity.  He has normal strength on the left.  He has significant expressive aphasia. SKIN: There are no ulcers or  rashes noted. PSYCHIATRIC: The patient has a normal affect.  DATA:    CAROTID DUPLEX: I reviewed his carotid duplex scan that was done on 08/21/2019.  On the left side, which is the side of concern, he had a greater than 80% proximal ICA stenosis.  Peak systolic velocity was 284 cm/s with an end-diastolic velocity of 132 cm/s.  He had a less than 39% right carotid stenosis.  Vertebral arteries were patent with antegrade flow.

## 2019-08-26 NOTE — Progress Notes (Signed)
 REASON FOR CONSULT:    Left carotid stenosis.  The consult is requested by Dr. Ronald Roberts.  ASSESSMENT & PLAN:   SYMPTOMATIC LEFT CAROTID STENOSIS: This patient had a left brain stroke in April of this year while in [place] and has a severe left carotid stenosis.  He has now moved to [place] to be with family.  I would recommend left carotid endarterectomy in order to lower his risk of future stroke.  I explained that given he has had a stroke and given the severity of the stenosis his risk of stroke without surgery is 10 %/year.  We can significantly lower his risk with surgery.  I have it reviewed the indications for carotid endarterectomy and the potential complications including but not limited to perioperative stroke (1 to 2% risk), MI, nerve injury, or other unpredictable medical problems.  Patient is agreeable to proceed and his surgery has been scheduled for 09/08/2019.  He is on aspirin and is on a statin.  We will hold his Plavix for 7 days preoperatively.  I also discussed with him the importance of tobacco cessation.   Burrel Legrand, MD Office: [phone]   HPI:   Corey Bennett is a pleasant 59 y.o. male, referred with a left carotid stenosis.  I did find a note from outpatient rehab.  This note documents that the brother believes that the stroke occurred on 06/26/2019.  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  He was admitted to inpatient rehab on 07/03/2019 at [facility] [place].  In speaking with the patient's brother who is with him today he had a stroke in [place] on 06/26/2019 associated with significant right-sided weakness and expressive aphasia.  Thus the history is largely obtained from his brother.  He was in the hospital and found to have a left carotid stenosis.  He was felt that he would require left carotid endarterectomy but that he needed to stabilize first.  He subsequently has  moved to [place] to be closer to family and has been getting some therapy.  His strength has improved slightly in the right upper extremity.  He is ambulatory some.  He still has a fairly profound expressive aphasia.  He is on aspirin, Plavix, and a statin.  He denies any significant cardiac history.  Specifically he denies chest pain or chest pressure.  He denies any history of myocardial infarction or history of congestive heart failure.  His risk factors for peripheral vascular disease include diabetes, hypertension, hypercholesterolemia, a family history of premature cardiovascular disease, and tobacco use.  He smoked 1 pack/day for 40 to 50 years.  Past Medical History:  Diagnosis Date  . Carotid artery occlusion   . Diabetes (HCC)   . Hypercholesterolemia   . Hypertension   . Stroke (HCC)     History reviewed. No pertinent family history.  SOCIAL HISTORY: Social History   Socioeconomic History  . Marital status: Single    Spouse name: Not on file  . Number of children: Not on file  . Years of education: Not on file  . Highest education level: Not on file  Occupational History  . Not on file  Tobacco Use  . Smoking status: Current Every Day Smoker    Packs/day: 1.00    Types: Cigarettes  . Smokeless tobacco: Never Used  Vaping Use  . Vaping Use: Never used  Substance and Sexual Activity  . Alcohol use: Yes  .   Drug use: Not on file  . Sexual activity: Not on file  Other Topics Concern  . Not on file  Social History Narrative  . Not on file   Social Determinants of Health   Financial Resource Strain:   . Difficulty of Paying Living Expenses:   Food Insecurity:   . Worried About Running Out of Food in the Last Year:   . Ran Out of Food in the Last Year:   Transportation Needs:   . Lack of Transportation (Medical):   . Lack of Transportation (Non-Medical):   Physical Activity:   . Days of Exercise per Week:   . Minutes of Exercise per Session:   Stress:    . Feeling of Stress :   Social Connections:   . Frequency of Communication with Friends and Family:   . Frequency of Social Gatherings with Friends and Family:   . Attends Religious Services:   . Active Member of Clubs or Organizations:   . Attends Club or Organization Meetings:   . Marital Status:   Intimate Partner Violence:   . Fear of Current or Ex-Partner:   . Emotionally Abused:   . Physically Abused:   . Sexually Abused:     No Known Allergies  Current Outpatient Medications  Medication Sig Dispense Refill  . amLODipine (NORVASC) 10 MG tablet Take 10 mg by mouth daily.    . aspirin EC 81 MG tablet Take 81 mg by mouth daily.    . atorvastatin (LIPITOR) 80 MG tablet Take 80 mg by mouth daily.    . cholecalciferol (VITAMIN D3) 10 MCG (400 UNIT) TABS tablet Take 5,000 Units by mouth daily. Vitamin D3 5000 intl unites (125 mcg)    . clopidogrel (PLAVIX) 75 MG tablet Take 75 mg by mouth daily.    . insulin glargine (LANTUS) 100 UNIT/ML injection Inject 12 Units into the skin at bedtime.    . insulin lispro (HUMALOG) 100 UNIT/ML injection Inject 5 Units into the skin 3 (three) times daily before meals.    . losartan (COZAAR) 25 MG tablet Take 25 mg by mouth daily.    . metFORMIN (GLUCOPHAGE) 1000 MG tablet Take 1,000 mg by mouth 2 (two) times daily with a meal.    . polyethylene glycol (MIRALAX / GLYCOLAX) 17 g packet Take 17 g by mouth as needed.    . senna (SENOKOT) 8.6 MG TABS tablet Take 1 tablet by mouth as needed.    . tamsulosin (FLOMAX) 0.4 MG CAPS capsule Take 0.4 mg by mouth at bedtime.     No current facility-administered medications for this visit.    REVIEW OF SYSTEMS:  [X] denotes positive finding, [ ] denotes negative finding Cardiac  Comments:  Chest pain or chest pressure:    Shortness of breath upon exertion: x   Short of breath when lying flat:    Irregular heart rhythm: x       Vascular    Pain in calf, thigh, or hip brought on by ambulation:    Pain  in feet at night that wakes you up from your sleep:     Blood clot in your veins:    Leg swelling:         Pulmonary    Oxygen at home:    Productive cough:     Wheezing:         Neurologic    Sudden weakness in arms or legs:  x   Sudden numbness in arms or legs:       Sudden onset of difficulty speaking or slurred speech: x   Temporary loss of vision in one eye:     Problems with dizziness:         Gastrointestinal    Blood in stool:     Vomited blood:         Genitourinary    Burning when urinating:     Blood in urine:        Psychiatric    Major depression:         Hematologic    Bleeding problems:    Problems with blood clotting too easily:        Skin    Rashes or ulcers:        Constitutional    Fever or chills:     PHYSICAL EXAM:   Vitals:   08/26/19 1039 08/26/19 1049  BP: 98/61 100/64  Pulse: 75   Resp: 20   Temp: 98 F (36.7 C)   SpO2: 98%   Weight: 161 lb (73 kg)   Height: 6\' 1"  (1.854 m)     GENERAL: The patient is a well-nourished male, in no acute distress. The vital signs are documented above. CARDIAC: There is a regular rate and rhythm.  VASCULAR: I do not detect carotid bruits. I cannot palpate pedal pulses however he has a brisk anterior tibial and posterior tibial signal on the right and a brisk dorsalis pedis and posterior tibial signal on the left. PULMONARY: There is good air exchange bilaterally without wheezing or rales. ABDOMEN: Soft and non-tender with normal pitched bowel sounds.  His aorta is palpable as he is very thin but does not feel aneurysmal. MUSCULOSKELETAL: There are no major deformities or cyanosis. NEUROLOGIC: He has fairly profound right upper extremity weakness with very weak grip and very weak abductors.  Biceps strength and triceps strength are diminished.  He has reasonable strength in the right lower extremity.  He has normal strength on the left.  He has significant expressive aphasia. SKIN: There are no ulcers or  rashes noted. PSYCHIATRIC: The patient has a normal affect.  DATA:    CAROTID DUPLEX: I reviewed his carotid duplex scan that was done on 08/21/2019.  On the left side, which is the side of concern, he had a greater than 80% proximal ICA stenosis.  Peak systolic velocity was 284 cm/s with an end-diastolic velocity of 132 cm/s.  He had a less than 39% right carotid stenosis.  Vertebral arteries were patent with antegrade flow.

## 2019-09-02 ENCOUNTER — Ambulatory Visit: Payer: BC Managed Care – PPO | Admitting: Occupational Therapy

## 2019-09-03 NOTE — Pre-Procedure Instructions (Addendum)
Your procedure is scheduled on Tuesday, July 6th from 07:30 AM- 09:50 AM.  Report to [facility] Main Entrance "A" at 05:30 A.M., and check in at the Admitting office.  Call this number if you have problems the morning of surgery:  [phone]  Call [phone] if you have any questions prior to your surgery date Monday-Friday 8am-4pm.    Remember:  Do not eat or drink after midnight the night before your surgery.     Take these medicines the morning of surgery with A SIP OF WATER: amLODipine (NORVASC) atorvastatin (LIPITOR)  *STOP clopidogrel (PLAVIX) 7 days prior to surgery.  *Follow your surgeon's instructions on when to stop Aspirin.  If no instructions were given by your surgeon then you will need to call the office to get those instructions.    As of today, STOP taking any Aleve, Naproxen, Ibuprofen, Motrin, Advil, Goody's, BC's, all herbal medications, fish oil, and all vitamins.   WHAT DO I DO ABOUT MY DIABETES MEDICATION?  . THE NIGHT BEFORE SURGERY, take 6 units of Insulin Glargine (BASAGLAR KWIKPEN); do not take bedtime dose of insulin lispro (HUMALOG).       . The day of surgery, do not take other diabetes injectables, including Insulin Glargine (BASAGLAR KWIKPEN) or insulin lispro (HUMALOG). However, If your CBG is greater than 220 mg/dL, you may take  of your sliding scale (correction) dose of insulin lispro (HUMALOG).   Do not take metFORMIN (GLUCOPHAGE) the morning of surgery.   HOW TO MANAGE YOUR DIABETES BEFORE AND AFTER SURGERY  Why is it important to control my blood sugar before and after surgery? . Improving blood sugar levels before and after surgery helps healing and can limit problems. . A way of improving blood sugar control is eating a healthy diet by: o  Eating less sugar and carbohydrates o  Increasing activity/exercise o  Talking with your doctor about reaching your blood sugar goals . High blood sugars (greater than 180 mg/dL) can raise  your risk of infections and slow your recovery, so you will need to focus on controlling your diabetes during the weeks before surgery. . Make sure that the doctor who takes care of your diabetes knows about your planned surgery including the date and location.  How do I manage my blood sugar before surgery? . Check your blood sugar at least 4 times a day, starting 2 days before surgery, to make sure that the level is not too high or low. . Check your blood sugar the morning of your surgery when you wake up and every 2 hours until you get to the Short Stay unit. o If your blood sugar is less than 70 mg/dL, you will need to treat for low blood sugar: - Do not take insulin. - Treat a low blood sugar (less than 70 mg/dL) with  cup of clear juice (cranberry or apple), 4 glucose tablets, OR glucose gel. - Recheck blood sugar in 15 minutes after treatment (to make sure it is greater than 70 mg/dL). If your blood sugar is not greater than 70 mg/dL on recheck, call [phone] for further instructions. . Report your blood sugar to the short stay nurse when you get to Short Stay.  . If you are admitted to the hospital after surgery: o Your blood sugar will be checked by the staff and you will probably be given insulin after surgery (instead of oral diabetes medicines) to make sure you have good blood sugar levels. o The goal for  blood sugar control after surgery is 80-180 mg/dL.      The Morning of Surgery:                  Do not wear jewelry.            Do not wear lotions, powders, colognes, or deodorant.            Men may shave face and neck.            Do not bring valuables to the hospital.            [facility] is not responsible for any belongings or valuables.  Do NOT Smoke (Tobacco/Vapping) or drink Alcohol 24 hours prior to your procedure.  If you use a CPAP at night, you may bring all equipment for your overnight stay.   Contacts, glasses, dentures or bridgework may not be worn  into surgery.      For patients admitted to the hospital, discharge time will be determined by your treatment team.   Patients discharged the day of surgery will not be allowed to drive home, and someone needs to stay with them for 24 hours.    Special instructions:   [facility]- Preparing For Surgery  Before surgery, you can play an important role. Because skin is not sterile, your skin needs to be as free of germs as possible. You can reduce the number of germs on your skin by washing with CHG (chlorahexidine gluconate) Soap before surgery.  CHG is an antiseptic cleaner which kills germs and bonds with the skin to continue killing germs even after washing.    Oral Hygiene is also important to reduce your risk of infection.  Remember - BRUSH YOUR TEETH THE MORNING OF SURGERY WITH YOUR REGULAR TOOTHPASTE  Please do not use if you have an allergy to CHG or antibacterial soaps. If your skin becomes reddened/irritated stop using the CHG.  Do not shave (including legs and underarms) for at least 48 hours prior to first CHG shower. It is OK to shave your face.  Please follow these instructions carefully.   1. Shower the NIGHT BEFORE SURGERY and the MORNING OF SURGERY with CHG Soap.   2. If you chose to wash your hair, wash your hair first as usual with your normal shampoo.  3. After you shampoo, rinse your hair and body thoroughly to remove the shampoo.  4. Use CHG as you would any other liquid soap. You can apply CHG directly to the skin and wash gently with a scrungie or a clean washcloth.   5. Apply the CHG Soap to your body ONLY FROM THE NECK DOWN.  Do not use on open wounds or open sores. Avoid contact with your eyes, ears, mouth and genitals (private parts). Wash Face and genitals (private parts)  with your normal soap.   6. Wash thoroughly, paying special attention to the area where your surgery will be performed.  7. Thoroughly rinse your body with warm water from the neck  down.  8. DO NOT shower/wash with your normal soap after using and rinsing off the CHG Soap.  9. Pat yourself dry with a CLEAN TOWEL.  10. Wear CLEAN PAJAMAS to bed the night before surgery, wear comfortable clothes the morning of surgery  11. Place CLEAN SHEETS on your bed the night of your first shower and DO NOT SLEEP WITH PETS.   Day of Surgery: Shower with CHG Soap.  Do not apply any deodorants/lotions.  Please wear clean clothes to the hospital/surgery center.   Remember to brush your teeth WITH YOUR REGULAR TOOTHPASTE.   Please read over the following fact sheets that you were given.

## 2019-09-04 ENCOUNTER — Telehealth: Payer: Self-pay

## 2019-09-04 ENCOUNTER — Other Ambulatory Visit: Payer: Self-pay

## 2019-09-04 ENCOUNTER — Encounter (HOSPITAL_COMMUNITY): Payer: Self-pay

## 2019-09-04 ENCOUNTER — Other Ambulatory Visit (HOSPITAL_COMMUNITY)
Admission: RE | Admit: 2019-09-04 | Discharge: 2019-09-04 | Disposition: A | Discharge status: Home / Self Care | Payer: BC Managed Care – PPO | Source: Ambulatory Visit | Attending: Vascular Surgery | Admitting: Vascular Surgery

## 2019-09-04 ENCOUNTER — Encounter (HOSPITAL_COMMUNITY)
Admission: RE | Admit: 2019-09-04 | Discharge: 2019-09-04 | Disposition: A | Discharge status: Home / Self Care | Payer: BC Managed Care – PPO | Source: Ambulatory Visit | Attending: Vascular Surgery | Admitting: Vascular Surgery

## 2019-09-04 DIAGNOSIS — Z01818 Encounter for other preprocedural examination: Secondary | ICD-10-CM | POA: Diagnosis not present

## 2019-09-04 DIAGNOSIS — Z20822 Contact with and (suspected) exposure to covid-19: Secondary | ICD-10-CM | POA: Diagnosis not present

## 2019-09-04 HISTORY — DX: Paralytic syndrome, unspecified: G83.9

## 2019-09-04 LAB — CBC
HCT: 39.6 % (ref 39.0–52.0)
Hemoglobin: 13.4 g/dL (ref 13.0–17.0)
MCH: 29.6 pg (ref 26.0–34.0)
MCHC: 33.8 g/dL (ref 30.0–36.0)
MCV: 87.4 fL (ref 80.0–100.0)
Platelets: 337 10*3/uL (ref 150–400)
RBC: 4.53 MIL/uL (ref 4.22–5.81)
RDW: 13.5 % (ref 11.5–15.5)
WBC: 13.5 10*3/uL — ABNORMAL HIGH (ref 4.0–10.5)
nRBC: 0 % (ref 0.0–0.2)

## 2019-09-04 LAB — URINALYSIS, ROUTINE W REFLEX MICROSCOPIC
Bilirubin Urine: NEGATIVE
Glucose, UA: NEGATIVE mg/dL
Hgb urine dipstick: NEGATIVE
Ketones, ur: 5 mg/dL — AB
Leukocytes,Ua: NEGATIVE
Nitrite: NEGATIVE
Protein, ur: 100 mg/dL — AB
Specific Gravity, Urine: 1.019 (ref 1.005–1.030)
pH: 5 (ref 5.0–8.0)

## 2019-09-04 LAB — COMPREHENSIVE METABOLIC PANEL
ALT: 22 U/L (ref 0–44)
AST: 30 U/L (ref 15–41)
Albumin: 4.3 g/dL (ref 3.5–5.0)
Alkaline Phosphatase: 119 U/L (ref 38–126)
Anion gap: 17 — ABNORMAL HIGH (ref 5–15)
BUN: 21 mg/dL — ABNORMAL HIGH (ref 6–20)
CO2: 21 mmol/L — ABNORMAL LOW (ref 22–32)
Calcium: 9.3 mg/dL (ref 8.9–10.3)
Chloride: 100 mmol/L (ref 98–111)
Creatinine, Ser: 1.56 mg/dL — ABNORMAL HIGH (ref 0.61–1.24)
GFR calc Af Amer: 56 mL/min — ABNORMAL LOW (ref >60)
GFR calc non Af Amer: 48 mL/min — ABNORMAL LOW (ref >60)
Glucose, Bld: 138 mg/dL — ABNORMAL HIGH (ref 70–99)
Potassium: 3.1 mmol/L — ABNORMAL LOW (ref 3.5–5.1)
Sodium: 138 mmol/L (ref 135–145)
Total Bilirubin: 1.3 mg/dL — ABNORMAL HIGH (ref 0.3–1.2)
Total Protein: 8.1 g/dL (ref 6.5–8.1)

## 2019-09-04 LAB — APTT: aPTT: 35 seconds (ref 24–36)

## 2019-09-04 LAB — SURGICAL PCR SCREEN
MRSA, PCR: NEGATIVE
Staphylococcus aureus: NEGATIVE

## 2019-09-04 LAB — HEMOGLOBIN A1C
Hgb A1c MFr Bld: 7.2 % — ABNORMAL HIGH (ref 4.8–5.6)
Mean Plasma Glucose: 159.94 mg/dL

## 2019-09-04 LAB — PROTIME-INR
INR: 1.1 (ref 0.8–1.2)
Prothrombin Time: 13.5 seconds (ref 11.4–15.2)

## 2019-09-04 LAB — SARS CORONAVIRUS 2 (TAT 6-24 HRS): SARS Coronavirus 2: NEGATIVE

## 2019-09-04 LAB — GLUCOSE, CAPILLARY: Glucose-Capillary: 119 mg/dL — ABNORMAL HIGH (ref 70–99)

## 2019-09-04 NOTE — Progress Notes (Signed)
Pre-Admission Testing Appointment questions answered by patient's brother, Jacere Pangborn who was present at appointment. Per patient's brother, patient resides at [facility]   PCP - Burton Apley, MD Cardiologist - Denies  PPM/ICD - Denies  Chest x-ray - N/A EKG - 09/04/19 Stress Test - Denies ECHO - Patient's brother Amarii Bordas believes patient may have had one done at [facility] in [place]. Records Requested. Cardiac Cath - Denies  Sleep Study - Denies  Fasting Blood Sugar: Patient and brother ensure. Per patient's brother, CBG is usually checked at the assisted living facility x1 weekly. CBG at PAT appointment was 119. A1C obtained.  Blood Thinner Instructions: Stop Plavix 7 days prior to sx. Per patient's brother, last dose 08/31/19 Aspirin Instructions: Patient's brother to call Dr. Adele Dan office for instructions.  ERAS Protcol - N/A PRE-SURGERY Ensure or G2- N/A  COVID TEST- 09/04/19   Anesthesia review: Yes, stroke 06/2019; Review ECHO.  Patient denies shortness of breath, fever, cough and chest pain at PAT appointment   All instructions explained to the patient, with a verbal understanding of the material. Patient agrees to go over the instructions while at home for a better understanding. Patient also instructed to self quarantine after being tested for COVID-19. The opportunity to ask questions was provided.

## 2019-09-04 NOTE — Anesthesia Preprocedure Evaluation (Addendum)
Anesthesia Evaluation  Patient identified by MRN, date of birth, ID band Patient awake    Reviewed: Allergy & Precautions, NPO status , Patient's Chart, lab work & pertinent test results  History of Anesthesia Complications Negative for: history of anesthetic complications  Airway Mallampati: II  TM Distance: >3 FB Neck ROM: Full    Dental  (+) Dental Advisory Given, Missing   Pulmonary former smoker,    Pulmonary exam normal        Cardiovascular hypertension, Pt. on medications + Peripheral Vascular Disease  Normal cardiovascular exam   '21 Carotid US - 80-99% left ICAS, 1-39% right ICAS  '21 TTE - PAT Note - EF 50 to 55%. Trace MR and TR. Bubble study negative for PFO.    Neuro/Psych  CVA < 3 months ago, residual right arm weakness (can raise arm against gravity, but no grip), right sided facial droop, word-finding difficulties  CVA, Residual Symptoms negative psych ROS   GI/Hepatic negative GI ROS, Neg liver ROS,   Endo/Other  diabetes, Type 2, Oral Hypoglycemic Agents, Insulin Dependent  Renal/GU negative Renal ROS     Musculoskeletal negative musculoskeletal ROS (+)   Abdominal   Peds  Hematology negative hematology ROS (+)  On plavix    Anesthesia Other Findings Covid test negative   Reproductive/Obstetrics                           Anesthesia Physical Anesthesia Plan  ASA: IV  Anesthesia Plan: General   Post-op Pain Management:    Induction: Intravenous  PONV Risk Score and Plan: 2 and Treatment may vary due to age or medical condition, Ondansetron, Dexamethasone and Midazolam  Airway Management Planned: Oral ETT  Additional Equipment: Arterial line  Intra-op Plan:   Post-operative Plan: Extubation in OR  Informed Consent: I have reviewed the patients History and Physical, chart, labs and discussed the procedure including the risks, benefits and alternatives  for the proposed anesthesia with the patient or authorized representative who has indicated his/her understanding and acceptance.     Dental advisory given  Plan Discussed with: CRNA and Anesthesiologist  Anesthesia Plan Comments:       Anesthesia Quick Evaluation

## 2019-09-04 NOTE — Telephone Encounter (Signed)
PAT called to let us know pt has ketones, protein and rare bacteria in urinalysis. Potassium level is 3.1. I have made Dr. Edilia Bo aware of U/A.

## 2019-09-04 NOTE — Progress Notes (Addendum)
Anesthesia Chart Review:  Case: [id number] Date/Time: 09/08/19 0715   Procedure: LEFT CAROTID ENDARTERECTOMY (Left Neck)   Anesthesia type: General   Pre-op diagnosis: LEFT CAROTID ARTERY STENOSIS   Location: [facility] ROOM 11 / [facility] OR   Surgeons: Chuck Hint, MD      DISCUSSION: Patient is a 59 year old male scheduled for the above procedure.  History includes smoking, HTN, hypercholesterolemia, DM2, CVA (RUE weakness, expressive aphasia) ~ 06/26/19 in [place], [place]. 06/29/19 echo showed no cardiac source of CVA, but reported left ICA stenosis. Currently no additional records available from [facility], but apparently he spent time in in-patient rehab following CVA and then relocated to [place] to be closer to family. He had follow-up with Avis Epley, MD here in [place] who ordered a carotid US showed showed 80-99% LICA stenosis, so patient was referred to Dr. Edilia Bo. 09/04/19 Creatinine 1.56, but currently no comparison labs.   Preoperative labs showed A1c 7.2, H/H 13.4/39.6, PLT 336, K 3.1, BUN 21, Cr 1.56, total bilirubin 1.3, AST 30, ALT 22. Currently no comparison labs.  Will attempt to get copy of discharge summary and labs from [facility] for comparison.   Per [facility], hold Plavix 7 days prior to surgery.  09/04/2019 presurgical COVID-19 test negative.  If additional pertinent records received prior to surgery date then I will plan to update note, otherwise anesthesia team to evaluate on the day of surgery.  UPDATE 09/04/19 5:44 PM: Additional records received from [facility].  Apparently he was found at home on 06/29/19 by a co-worker when he did not show for work. He was found on his couch in urine and feces and unable to speak and with right-sided weakness.  He had last been seen normal 4 days prior but had spoken on the phone 48 hours prior. He was diagnosed with a left MCA infarct and severe LICA stenosis. He required a PEG tube for nutrition due to dysphagia.  A1c  was 9.1 and was discharged on Metformin.  Creatinine also elevated at 1.4, unsure of baseline but thought likely elevated due to dehydration. Out-patient left CEA recommended.  UA results previously called to [facility], reportedly request for repeat UA on the day of surgery.    VS: BP 115/69   Pulse 84   Temp 36.7 C (Oral)   Resp 18   Ht 6\' 1"  (1.854 m)   Wt 71.7 kg   SpO2 100%   BMI 20.85 kg/m    PROVIDERS: , MD is PCP [phone])   LABS: Preoperative labs noted.  See DISCUSSION. (all labs ordered are listed, but only abnormal results are displayed)  Labs Reviewed  GLUCOSE, CAPILLARY - Abnormal; Notable for the following components:      Result Value   Glucose-Capillary 119 (*)    All other components within normal limits  CBC - Abnormal; Notable for the following components:   WBC 13.5 (*)    All other components within normal limits  COMPREHENSIVE METABOLIC PANEL - Abnormal; Notable for the following components:   Potassium 3.1 (*)    CO2 21 (*)    Glucose, Bld 138 (*)    BUN 21 (*)    Creatinine, Ser 1.56 (*)    Total Bilirubin 1.3 (*)    GFR calc non Af Amer 48 (*)    GFR calc Af Amer 56 (*)    Anion gap 17 (*)    All other components within normal limits  URINALYSIS, ROUTINE W REFLEX MICROSCOPIC - Abnormal;  Notable for the following components:   APPearance HAZY (*)    Ketones, ur 5 (*)    Protein, ur 100 (*)    Bacteria, UA RARE (*)    All other components within normal limits  SURGICAL PCR SCREEN  APTT  PROTIME-INR  HEMOGLOBIN A1C   IMAGING: CTA head/neck 07/03/19 [facility]): Impression: 1.  Expected evolution of recent large left MCA distribution infarct.  No new intracranial findings. 2.  Short segment, high-grade 70 to 99% stenosis at the origin of the left cervical ICA with normal distal flow. 3.  Mild short segment stenosis of the left cavernous carotid artery with normal distal flow.  The left MCA and its branches appear  patent. 4.  Chronic left maxillary sinusitis.  MRI Brain 06/20/19 [facility]): Impression: Large late acute cortical infarct in the left MCA distribution.  No significant mass-effect or evidence of hemorrhage.  CT head 06/29/19 [facility]): Impression: 1.  Large cortical infarct involving the left middle cerebral artery and left anterior cerebral artery distribution. 2.  There are 2 hyperdense foci in the left frontal lobe.  1V PCXR 06/29/19 [facility]): Impression: 1.  No acute cardiopulmonary process.  No radiopaque foreign body.   EKG: 09/04/19: Normal sinus rhythm Normal ECG No old tracing to compare Confirmed by Rinaldo Cloud ([id number]) on 09/04/2019 3:13:35 PM - Artifact in V1, mildly negative T wave in aVL   CV: Carotid US 08/21/19: Summary:  - Right Carotid: Velocities in the right ICA are consistent with a 1-39%  stenosis.  - Left Carotid: Velocities in the left ICA are consistent with a 80-99%  stenosis.  - Vertebrals: Bilateral vertebral arteries demonstrate antegrade flow.  - Subclavians: Normal flow hemodynamics were seen in bilateral subclavian arteries.   Echo (TTE) with Bubble Study 06/29/19 [facility]): Conclusions: LV is normal in size with normal wall thickness, wall motion and ejection fraction.  Normal diastolic function.  Estimated EF 50 to 55%. RV is normal in size with normal wall thickness, motion and function. Mitral valve leaflets are mildly thickened.  Trace mitral valve regurgitation.  No mitral valve stenosis is seen. The aortic valve is sclerotic without functional stenosis.  No aortic valve insufficiency. Normal appearance of the tricuspid valve.  Trace tricuspid valve regurgitation.  No tricuspid valve stenosis seen. Bubble study negative for PFO.   Past Medical History:  Diagnosis Date  . Carotid artery occlusion   . Diabetes (HCC)   . Hypercholesterolemia   . Hypertension   . 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 Stroke [facility])     Past Surgical History:  Procedure Laterality Date  . TONSILLECTOMY      MEDICATIONS: . amLODipine (NORVASC) 10 MG tablet  . aspirin EC 81 MG tablet  . atorvastatin (LIPITOR) 80 MG tablet  . Cholecalciferol (VITAMIN D-3) 125 MCG (5000 UT) TABS  . clopidogrel (PLAVIX) 75 MG tablet  . Insulin Glargine (BASAGLAR KWIKPEN) 100 UNIT/ML  . insulin lispro (HUMALOG) 100 UNIT/ML injection  . losartan (COZAAR) 25 MG tablet  . metFORMIN (GLUCOPHAGE) 1000 MG tablet  . senna (SENOKOT) 8.6 MG TABS tablet  . tamsulosin (FLOMAX) 0.4 MG CAPS capsule   No current facility-administered medications for this encounter.    Shonna Chock, PA-C Surgical Short Stay/Anesthesiology [facility] Phone [phone] [facility] Phone [phone] 09/04/2019 3:28 PM

## 2019-09-04 NOTE — Progress Notes (Signed)
Called in Abnormal U/A and Potassium of 3.1 to Dr. Adele Dan office.

## 2019-09-08 ENCOUNTER — Inpatient Hospital Stay (HOSPITAL_COMMUNITY): Payer: BC Managed Care – PPO

## 2019-09-08 ENCOUNTER — Inpatient Hospital Stay (HOSPITAL_COMMUNITY): Payer: BC Managed Care – PPO | Admitting: Vascular Surgery

## 2019-09-08 ENCOUNTER — Encounter (HOSPITAL_COMMUNITY)
Admission: RE | Disposition: A | Discharge status: Nursing Home (Includes ICF) | Payer: Self-pay | Source: Skilled Nursing Facility | Attending: Vascular Surgery

## 2019-09-08 ENCOUNTER — Inpatient Hospital Stay (HOSPITAL_COMMUNITY)
Admission: RE | Admit: 2019-09-08 | Discharge: 2019-09-09 | DRG: 026 | Disposition: A | Discharge status: Nursing Home (Includes ICF) | Payer: BC Managed Care – PPO | Source: Skilled Nursing Facility | Attending: Vascular Surgery | Admitting: Vascular Surgery

## 2019-09-08 ENCOUNTER — Other Ambulatory Visit: Payer: Self-pay

## 2019-09-08 ENCOUNTER — Encounter (HOSPITAL_COMMUNITY): Payer: Self-pay | Admitting: Vascular Surgery

## 2019-09-08 DIAGNOSIS — E7800 Pure hypercholesterolemia, unspecified: Secondary | ICD-10-CM | POA: Diagnosis present

## 2019-09-08 DIAGNOSIS — F17210 Nicotine dependence, cigarettes, uncomplicated: Secondary | ICD-10-CM | POA: Diagnosis present

## 2019-09-08 DIAGNOSIS — I69351 Hemiplegia and hemiparesis following cerebral infarction affecting right dominant side: Secondary | ICD-10-CM | POA: Diagnosis not present

## 2019-09-08 DIAGNOSIS — Z79899 Other long term (current) drug therapy: Secondary | ICD-10-CM

## 2019-09-08 DIAGNOSIS — Z8249 Family history of ischemic heart disease and other diseases of the circulatory system: Secondary | ICD-10-CM

## 2019-09-08 DIAGNOSIS — I6523 Occlusion and stenosis of bilateral carotid arteries: Principal | ICD-10-CM | POA: Diagnosis present

## 2019-09-08 DIAGNOSIS — Z7902 Long term (current) use of antithrombotics/antiplatelets: Secondary | ICD-10-CM | POA: Diagnosis not present

## 2019-09-08 DIAGNOSIS — I10 Essential (primary) hypertension: Secondary | ICD-10-CM | POA: Diagnosis present

## 2019-09-08 DIAGNOSIS — E119 Type 2 diabetes mellitus without complications: Secondary | ICD-10-CM | POA: Diagnosis present

## 2019-09-08 DIAGNOSIS — I6522 Occlusion and stenosis of left carotid artery: Secondary | ICD-10-CM

## 2019-09-08 DIAGNOSIS — I69392 Facial weakness following cerebral infarction: Secondary | ICD-10-CM

## 2019-09-08 DIAGNOSIS — Z794 Long term (current) use of insulin: Secondary | ICD-10-CM | POA: Diagnosis not present

## 2019-09-08 DIAGNOSIS — Z7982 Long term (current) use of aspirin: Secondary | ICD-10-CM | POA: Diagnosis not present

## 2019-09-08 DIAGNOSIS — Z716 Tobacco abuse counseling: Secondary | ICD-10-CM

## 2019-09-08 DIAGNOSIS — I69320 Aphasia following cerebral infarction: Secondary | ICD-10-CM | POA: Diagnosis not present

## 2019-09-08 DIAGNOSIS — I6529 Occlusion and stenosis of unspecified carotid artery: Secondary | ICD-10-CM | POA: Diagnosis present

## 2019-09-08 HISTORY — PX: PATCH ANGIOPLASTY: SHX6230

## 2019-09-08 HISTORY — PX: ENDARTERECTOMY: SHX5162

## 2019-09-08 LAB — GLUCOSE, CAPILLARY
Glucose-Capillary: 107 mg/dL — ABNORMAL HIGH (ref 70–99)
Glucose-Capillary: 157 mg/dL — ABNORMAL HIGH (ref 70–99)

## 2019-09-08 LAB — ABO/RH: ABO/RH(D): B POS

## 2019-09-08 LAB — TYPE AND SCREEN
ABO/RH(D): B POS
Antibody Screen: NEGATIVE

## 2019-09-08 LAB — POCT ACTIVATED CLOTTING TIME: Activated Clotting Time: 224 seconds

## 2019-09-08 SURGERY — ENDARTERECTOMY, CAROTID
Anesthesia: General | Site: Neck | Laterality: Left

## 2019-09-08 MED ORDER — INSULIN GLARGINE 100 UNIT/ML ~~LOC~~ SOLN
12.0000 [IU] | Freq: Every day | SUBCUTANEOUS | Status: DC
  Administered 2019-09-08: 12 [IU] via SUBCUTANEOUS
  Filled 2019-09-08 (×2): qty 0.12

## 2019-09-08 MED ORDER — LACTATED RINGERS IV SOLN: INTRAVENOUS | Status: DC | PRN

## 2019-09-08 MED ORDER — PROTAMINE SULFATE 10 MG/ML IV SOLN
INTRAVENOUS | Status: AC
  Filled 2019-09-08: qty 5

## 2019-09-08 MED ORDER — ROCURONIUM BROMIDE 10 MG/ML (PF) SYRINGE
PREFILLED_SYRINGE | INTRAVENOUS | Status: DC | PRN
  Administered 2019-09-08: 60 mg via INTRAVENOUS

## 2019-09-08 MED ORDER — HYDRALAZINE HCL 20 MG/ML IJ SOLN: 5.0000 mg | INTRAMUSCULAR | Status: DC | PRN

## 2019-09-08 MED ORDER — MIDAZOLAM HCL 2 MG/2ML IJ SOLN
INTRAMUSCULAR | Status: AC
  Filled 2019-09-08: qty 2

## 2019-09-08 MED ORDER — DOCUSATE SODIUM 100 MG PO CAPS
100.0000 mg | ORAL_CAPSULE | Freq: Every day | ORAL | Status: DC
  Administered 2019-09-09: 100 mg via ORAL
  Filled 2019-09-08: qty 1

## 2019-09-08 MED ORDER — ACETAMINOPHEN 650 MG RE SUPP: 325.0000 mg | RECTAL | Status: DC | PRN

## 2019-09-08 MED ORDER — LABETALOL HCL 5 MG/ML IV SOLN: 10.0000 mg | INTRAVENOUS | Status: DC | PRN

## 2019-09-08 MED ORDER — SENNA 8.6 MG PO TABS
1.0000 | ORAL_TABLET | Freq: Every day | ORAL | Status: DC
  Administered 2019-09-09: 8.6 mg via ORAL
  Filled 2019-09-08: qty 1

## 2019-09-08 MED ORDER — OXYCODONE-ACETAMINOPHEN 5-325 MG PO TABS: 1.0000 | ORAL_TABLET | ORAL | Status: DC | PRN

## 2019-09-08 MED ORDER — DEXAMETHASONE SODIUM PHOSPHATE 10 MG/ML IJ SOLN
INTRAMUSCULAR | Status: AC
  Filled 2019-09-08: qty 1

## 2019-09-08 MED ORDER — MORPHINE SULFATE (PF) 2 MG/ML IV SOLN: 2.0000 mg | INTRAVENOUS | Status: DC | PRN

## 2019-09-08 MED ORDER — EPHEDRINE 5 MG/ML INJ
INTRAVENOUS | Status: AC
  Filled 2019-09-08: qty 10

## 2019-09-08 MED ORDER — THROMBIN (RECOMBINANT) 20000 UNITS EX SOLR
CUTANEOUS | Status: AC
  Filled 2019-09-08: qty 20000

## 2019-09-08 MED ORDER — PHENYLEPHRINE 40 MCG/ML (10ML) SYRINGE FOR IV PUSH (FOR BLOOD PRESSURE SUPPORT)
PREFILLED_SYRINGE | INTRAVENOUS | Status: DC | PRN
  Administered 2019-09-08: 80 ug via INTRAVENOUS
  Administered 2019-09-08 (×2): 40 ug via INTRAVENOUS
  Administered 2019-09-08 (×2): 80 ug via INTRAVENOUS

## 2019-09-08 MED ORDER — PROPOFOL 10 MG/ML IV BOLUS
INTRAVENOUS | Status: DC | PRN
  Administered 2019-09-08: 130 mg via INTRAVENOUS

## 2019-09-08 MED ORDER — 0.9 % SODIUM CHLORIDE (POUR BTL) OPTIME
TOPICAL | Status: DC | PRN
  Administered 2019-09-08: 1000 mL

## 2019-09-08 MED ORDER — SODIUM CHLORIDE 0.9 % IV SOLN
INTRAVENOUS | Status: DC | PRN
  Administered 2019-09-08: 500 mL

## 2019-09-08 MED ORDER — INSULIN LISPRO 100 UNIT/ML ~~LOC~~ SOLN: 5.0000 [IU] | Freq: Three times a day (TID) | SUBCUTANEOUS | Status: DC

## 2019-09-08 MED ORDER — FENTANYL CITRATE (PF) 250 MCG/5ML IJ SOLN
INTRAMUSCULAR | Status: AC
  Filled 2019-09-08: qty 5

## 2019-09-08 MED ORDER — INSULIN ASPART 100 UNIT/ML ~~LOC~~ SOLN: 5.0000 [IU] | Freq: Three times a day (TID) | SUBCUTANEOUS | Status: DC

## 2019-09-08 MED ORDER — OXYCODONE HCL 5 MG/5ML PO SOLN: 5.0000 mg | Freq: Once | ORAL | Status: DC | PRN

## 2019-09-08 MED ORDER — ATORVASTATIN CALCIUM 80 MG PO TABS
80.0000 mg | ORAL_TABLET | Freq: Every day | ORAL | Status: DC
  Administered 2019-09-09: 80 mg via ORAL
  Filled 2019-09-08: qty 1

## 2019-09-08 MED ORDER — HEPARIN SODIUM (PORCINE) 1000 UNIT/ML IJ SOLN
INTRAMUSCULAR | Status: AC
  Filled 2019-09-08: qty 1

## 2019-09-08 MED ORDER — LOSARTAN POTASSIUM 25 MG PO TABS
25.0000 mg | ORAL_TABLET | Freq: Every day | ORAL | Status: DC
  Administered 2019-09-08 – 2019-09-09 (×2): 25 mg via ORAL
  Filled 2019-09-08 (×2): qty 1

## 2019-09-08 MED ORDER — ORAL CARE MOUTH RINSE: 15.0000 mL | Freq: Once | OROMUCOSAL | Status: AC

## 2019-09-08 MED ORDER — SODIUM CHLORIDE 0.9 % IV SOLN
0.0500 ug/kg/min | INTRAVENOUS | Status: DC
  Administered 2019-09-08: .15 ug/kg/min via INTRAVENOUS
  Filled 2019-09-08: qty 1000

## 2019-09-08 MED ORDER — CHLORHEXIDINE GLUCONATE 0.12 % MT SOLN
15.0000 mL | Freq: Once | OROMUCOSAL | Status: AC
  Administered 2019-09-08: 15 mL via OROMUCOSAL
  Filled 2019-09-08: qty 15

## 2019-09-08 MED ORDER — LIDOCAINE HCL (PF) 1 % IJ SOLN
INTRAMUSCULAR | Status: AC
  Filled 2019-09-08: qty 30

## 2019-09-08 MED ORDER — ONDANSETRON HCL 4 MG/2ML IJ SOLN
INTRAMUSCULAR | Status: DC | PRN
  Administered 2019-09-08: 4 mg via INTRAVENOUS

## 2019-09-08 MED ORDER — PROPOFOL 10 MG/ML IV BOLUS
INTRAVENOUS | Status: AC
  Filled 2019-09-08: qty 40

## 2019-09-08 MED ORDER — HEPARIN SODIUM (PORCINE) 1000 UNIT/ML IJ SOLN
INTRAMUSCULAR | Status: DC | PRN
Start: 2019-09-08 — End: 2019-09-08
  Administered 2019-09-08: 7000 [IU] via INTRAVENOUS
  Administered 2019-09-08: 1000 [IU] via INTRAVENOUS

## 2019-09-08 MED ORDER — MAGNESIUM SULFATE 2 GM/50ML IV SOLN: 2.0000 g | Freq: Every day | INTRAVENOUS | Status: DC | PRN

## 2019-09-08 MED ORDER — POTASSIUM CHLORIDE CRYS ER 20 MEQ PO TBCR: 20.0000 meq | EXTENDED_RELEASE_TABLET | Freq: Every day | ORAL | Status: DC | PRN

## 2019-09-08 MED ORDER — GUAIFENESIN-DM 100-10 MG/5ML PO SYRP: 15.0000 mL | ORAL_SOLUTION | ORAL | Status: DC | PRN

## 2019-09-08 MED ORDER — LACTATED RINGERS IV SOLN: INTRAVENOUS | Status: DC

## 2019-09-08 MED ORDER — ROCURONIUM BROMIDE 10 MG/ML (PF) SYRINGE
PREFILLED_SYRINGE | INTRAVENOUS | Status: AC
  Filled 2019-09-08: qty 10

## 2019-09-08 MED ORDER — PROMETHAZINE HCL 25 MG/ML IJ SOLN: 6.2500 mg | INTRAMUSCULAR | Status: DC | PRN

## 2019-09-08 MED ORDER — SODIUM CHLORIDE 0.9 % IV SOLN
INTRAVENOUS | Status: AC
  Filled 2019-09-08: qty 1.2

## 2019-09-08 MED ORDER — PHENYLEPHRINE 40 MCG/ML (10ML) SYRINGE FOR IV PUSH (FOR BLOOD PRESSURE SUPPORT)
PREFILLED_SYRINGE | INTRAVENOUS | Status: AC
  Filled 2019-09-08: qty 10

## 2019-09-08 MED ORDER — PROTAMINE SULFATE 10 MG/ML IV SOLN
INTRAVENOUS | Status: DC | PRN
Start: 2019-09-08 — End: 2019-09-08
  Administered 2019-09-08: 40 mg via INTRAVENOUS

## 2019-09-08 MED ORDER — OXYCODONE HCL 5 MG PO TABS: 5.0000 mg | ORAL_TABLET | Freq: Once | ORAL | Status: DC | PRN

## 2019-09-08 MED ORDER — METOPROLOL TARTRATE 5 MG/5ML IV SOLN: 2.0000 mg | INTRAVENOUS | Status: DC | PRN

## 2019-09-08 MED ORDER — FENTANYL CITRATE (PF) 100 MCG/2ML IJ SOLN
INTRAMUSCULAR | Status: DC | PRN
  Administered 2019-09-08: 100 ug via INTRAVENOUS
  Administered 2019-09-08: 50 ug via INTRAVENOUS

## 2019-09-08 MED ORDER — PHENOL 1.4 % MT LIQD: 1.0000 | OROMUCOSAL | Status: DC | PRN

## 2019-09-08 MED ORDER — SODIUM CHLORIDE 0.9 % IV SOLN: 500.0000 mL | Freq: Once | INTRAVENOUS | Status: DC | PRN

## 2019-09-08 MED ORDER — AMLODIPINE BESYLATE 10 MG PO TABS
10.0000 mg | ORAL_TABLET | Freq: Every day | ORAL | Status: DC
  Administered 2019-09-09: 10 mg via ORAL
  Filled 2019-09-08: qty 1

## 2019-09-08 MED ORDER — DEXTROSE 5 % IV SOLN
INTRAVENOUS | Status: AC
  Filled 2019-09-08: qty 1000

## 2019-09-08 MED ORDER — ALUM & MAG HYDROXIDE-SIMETH 200-200-20 MG/5ML PO SUSP: 15.0000 mL | ORAL | Status: DC | PRN

## 2019-09-08 MED ORDER — ASPIRIN 300 MG RE SUPP
300.0000 mg | RECTAL | Status: DC
  Filled 2019-09-08 (×2): qty 1

## 2019-09-08 MED ORDER — SODIUM CHLORIDE 0.9 % IV SOLN: INTRAVENOUS | Status: AC

## 2019-09-08 MED ORDER — CHLORHEXIDINE GLUCONATE 4 % EX LIQD: 60.0000 mL | Freq: Once | CUTANEOUS | Status: DC

## 2019-09-08 MED ORDER — CEFAZOLIN SODIUM-DEXTROSE 2-4 GM/100ML-% IV SOLN
2.0000 g | Freq: Three times a day (TID) | INTRAVENOUS | Status: AC
  Administered 2019-09-08 (×2): 2 g via INTRAVENOUS
  Filled 2019-09-08 (×2): qty 100

## 2019-09-08 MED ORDER — SUGAMMADEX SODIUM 200 MG/2ML IV SOLN
INTRAVENOUS | Status: DC | PRN
  Administered 2019-09-08: 200 mg via INTRAVENOUS

## 2019-09-08 MED ORDER — ACETAMINOPHEN 325 MG PO TABS: 325.0000 mg | ORAL_TABLET | ORAL | Status: DC | PRN

## 2019-09-08 MED ORDER — TAMSULOSIN HCL 0.4 MG PO CAPS
0.4000 mg | ORAL_CAPSULE | Freq: Every day | ORAL | Status: DC
  Administered 2019-09-08: 0.4 mg via ORAL
  Filled 2019-09-08: qty 1

## 2019-09-08 MED ORDER — DEXTRAN 40 IN D5W 10 % IV SOLN
INTRAVENOUS | Status: AC | PRN
  Administered 2019-09-08: 500 mL

## 2019-09-08 MED ORDER — SODIUM CHLORIDE 0.9 % IV SOLN: INTRAVENOUS | Status: DC

## 2019-09-08 MED ORDER — SODIUM CHLORIDE 0.9 % IV SOLN
INTRAVENOUS | Status: DC | PRN
  Administered 2019-09-08: 40 ug/min via INTRAVENOUS

## 2019-09-08 MED ORDER — DEXAMETHASONE SODIUM PHOSPHATE 10 MG/ML IJ SOLN
INTRAMUSCULAR | Status: DC | PRN
  Administered 2019-09-08: 5 mg via INTRAVENOUS

## 2019-09-08 MED ORDER — LIDOCAINE-EPINEPHRINE (PF) 1 %-1:200000 IJ SOLN
INTRAMUSCULAR | Status: DC | PRN
  Administered 2019-09-08: 10 mL

## 2019-09-08 MED ORDER — LIDOCAINE-EPINEPHRINE (PF) 1 %-1:200000 IJ SOLN
INTRAMUSCULAR | Status: AC
  Filled 2019-09-08: qty 30

## 2019-09-08 MED ORDER — LIDOCAINE 2% (20 MG/ML) 5 ML SYRINGE
INTRAMUSCULAR | Status: DC | PRN
  Administered 2019-09-08: 60 mg via INTRAVENOUS

## 2019-09-08 MED ORDER — FENTANYL CITRATE (PF) 100 MCG/2ML IJ SOLN: 25.0000 ug | INTRAMUSCULAR | Status: DC | PRN

## 2019-09-08 MED ORDER — ONDANSETRON HCL 4 MG/2ML IJ SOLN: 4.0000 mg | Freq: Four times a day (QID) | INTRAMUSCULAR | Status: DC | PRN

## 2019-09-08 MED ORDER — EPHEDRINE SULFATE 50 MG/ML IJ SOLN
INTRAMUSCULAR | Status: DC | PRN
  Administered 2019-09-08 (×4): 10 mg via INTRAVENOUS

## 2019-09-08 MED ORDER — ASPIRIN EC 81 MG PO TBEC
81.0000 mg | DELAYED_RELEASE_TABLET | Freq: Every day | ORAL | Status: DC
  Administered 2019-09-09: 81 mg via ORAL
  Filled 2019-09-08: qty 1

## 2019-09-08 MED ORDER — CEFAZOLIN SODIUM-DEXTROSE 2-4 GM/100ML-% IV SOLN
2.0000 g | INTRAVENOUS | Status: AC
  Administered 2019-09-08: 2 g via INTRAVENOUS
  Filled 2019-09-08: qty 100

## 2019-09-08 MED ORDER — ONDANSETRON HCL 4 MG/2ML IJ SOLN
INTRAMUSCULAR | Status: AC
  Filled 2019-09-08: qty 2

## 2019-09-08 MED ORDER — PANTOPRAZOLE SODIUM 40 MG PO TBEC
40.0000 mg | DELAYED_RELEASE_TABLET | Freq: Every day | ORAL | Status: DC
  Administered 2019-09-09: 40 mg via ORAL
  Filled 2019-09-08: qty 1

## 2019-09-08 SURGICAL SUPPLY — 46 items
BAG DECANTER FOR FLEXI CONT (MISCELLANEOUS) ×3 IMPLANT
CANISTER SUCT 3000ML PPV (MISCELLANEOUS) ×3 IMPLANT
CANNULA VESSEL 3MM 2 BLNT TIP (CANNULA) ×9 IMPLANT
CATH ROBINSON RED A/P 18FR (CATHETERS) ×3 IMPLANT
CLIP VESOCCLUDE MED 24/CT (CLIP) ×3 IMPLANT
CLIP VESOCCLUDE SM WIDE 24/CT (CLIP) ×3 IMPLANT
COVER WAND RF STERILE (DRAPES) IMPLANT
DERMABOND ADVANCED (GAUZE/BANDAGES/DRESSINGS) ×2
DERMABOND ADVANCED .7 DNX12 (GAUZE/BANDAGES/DRESSINGS) ×1 IMPLANT
DRAIN CHANNEL 15F RND FF W/TCR (WOUND CARE) IMPLANT
DRAPE HALF SHEET 40X57 (DRAPES) ×3 IMPLANT
ELECT REM PT RETURN 9FT ADLT (ELECTROSURGICAL) ×3
ELECTRODE REM PT RTRN 9FT ADLT (ELECTROSURGICAL) ×1 IMPLANT
EVACUATOR SILICONE 100CC (DRAIN) IMPLANT
GLOVE BIO SURGEON STRL SZ7.5 (GLOVE) ×3 IMPLANT
GLOVE BIOGEL PI IND STRL 6.5 (GLOVE) ×1 IMPLANT
GLOVE BIOGEL PI IND STRL 8 (GLOVE) ×1 IMPLANT
GLOVE BIOGEL PI INDICATOR 6.5 (GLOVE) ×2
GLOVE BIOGEL PI INDICATOR 8 (GLOVE) ×2
GLOVE SURG SYN 8.0 (GLOVE) ×3 IMPLANT
GOWN STRL REUS W/ TWL LRG LVL3 (GOWN DISPOSABLE) ×4 IMPLANT
GOWN STRL REUS W/TWL LRG LVL3 (GOWN DISPOSABLE) ×8
KIT BASIN OR (CUSTOM PROCEDURE TRAY) ×3 IMPLANT
KIT SHUNT ARGYLE CAROTID ART 6 (VASCULAR PRODUCTS) IMPLANT
KIT TURNOVER KIT B (KITS) ×3 IMPLANT
NEEDLE 22X1 1/2 (OR ONLY) (NEEDLE) ×3 IMPLANT
NEEDLE HYPO 25GX1X1/2 BEV (NEEDLE) IMPLANT
NS IRRIG 1000ML POUR BTL (IV SOLUTION) ×6 IMPLANT
PACK CAROTID (CUSTOM PROCEDURE TRAY) ×3 IMPLANT
PAD ARMBOARD 7.5X6 YLW CONV (MISCELLANEOUS) ×6 IMPLANT
PATCH VASC XENOSURE 1CMX6CM (Vascular Products) ×2 IMPLANT
PATCH VASC XENOSURE 1X6 (Vascular Products) ×1 IMPLANT
POSITIONER HEAD DONUT 9IN (MISCELLANEOUS) IMPLANT
SET WALTER ACTIVATION W/DRAPE (SET/KITS/TRAYS/PACK) IMPLANT
SHUNT CAROTID BYPASS 10 (VASCULAR PRODUCTS) ×3 IMPLANT
SHUNT CAROTID BYPASS 12 (VASCULAR PRODUCTS) IMPLANT
SPONGE SURGIFOAM ABS GEL 100 (HEMOSTASIS) IMPLANT
SUT PROLENE 6 0 BV (SUTURE) ×6 IMPLANT
SUT SILK 2 0 PERMA HAND 18 BK (SUTURE) IMPLANT
SUT VIC AB 3-0 SH 27 (SUTURE) ×2
SUT VIC AB 3-0 SH 27X BRD (SUTURE) ×1 IMPLANT
SUT VICRYL 4-0 PS2 18IN ABS (SUTURE) ×3 IMPLANT
SYR 20ML LL LF (SYRINGE) ×3 IMPLANT
SYR CONTROL 10ML LL (SYRINGE) ×3 IMPLANT
TOWEL GREEN STERILE (TOWEL DISPOSABLE) ×3 IMPLANT
WATER STERILE IRR 1000ML POUR (IV SOLUTION) ×3 IMPLANT

## 2019-09-08 NOTE — Anesthesia Postprocedure Evaluation (Signed)
Anesthesia Post Note  Patient: Corey Bennett  Procedure(s) Performed: LEFT CAROTID ENDARTERECTOMY (Left Neck) PATCH ANGIOPLASTY USING XENOSURE BIOLOGIC PATCH 1x6cm (Left Neck)     Patient location during evaluation: PACU Anesthesia Type: General Level of consciousness: awake and alert Pain management: pain level controlled Vital Signs Assessment: post-procedure vital signs reviewed and stable Respiratory status: spontaneous breathing, nonlabored ventilation and respiratory function stable Cardiovascular status: blood pressure returned to baseline and stable Postop Assessment: no apparent nausea or vomiting Anesthetic complications: no   No complications documented.  Last Vitals:  Vitals:   09/08/19 1132 09/08/19 1157  BP: (!) 117/52 (!) 121/56  Pulse: 69 66  Resp: 19 19  Temp: 36.8 C 36.4 C  SpO2: 96% 97%    Last Pain:  Vitals:   09/08/19 1157  TempSrc: Oral  PainSc:                  Beryle Lathe

## 2019-09-08 NOTE — Interval H&P Note (Signed)
History and Physical Interval Note:  09/08/2019 7:06 AM  Corey Bennett  has presented today for surgery, with the diagnosis of LEFT CAROTID ARTERY STENOSIS.  The various methods of treatment have been discussed with the patient and family. After consideration of risks, benefits and other options for treatment, the patient has consented to  Procedure(s): LEFT CAROTID ENDARTERECTOMY (Left) as a surgical intervention.  The patient's history has been reviewed, patient examined, no change in status, stable for surgery.  I have reviewed the patient's chart and labs.  Questions were answered to the patient's satisfaction.     Waverly Ferrari

## 2019-09-08 NOTE — Progress Notes (Signed)
Pt arrived to 4e. Telemetry box applied and CCMD notified. Vitals obtained. NIH completed. CHG bath completed. Condom cath placed due to pt being unable to use urinal.

## 2019-09-08 NOTE — Anesthesia Procedure Notes (Signed)
Procedure Name: Intubation Date/Time: 09/08/2019 7:57 AM Performed by: Imagene Riches, CRNA Pre-anesthesia Checklist: Patient identified, Emergency Drugs available, Suction available and Patient being monitored Patient Re-evaluated:Patient Re-evaluated prior to induction Oxygen Delivery Method: Circle System Utilized Preoxygenation: Pre-oxygenation with 100% oxygen Induction Type: IV induction Ventilation: Mask ventilation without difficulty Laryngoscope Size: Mac and 4 Grade View: Grade I Tube type: Oral Number of attempts: 1 Airway Equipment and Method: Stylet Placement Confirmation: ETT inserted through vocal cords under direct vision,  positive ETCO2 and breath sounds checked- equal and bilateral Secured at: 22 cm Tube secured with: Tape Dental Injury: Teeth and Oropharynx as per pre-operative assessment  Comments: Placed by Reece Agar, SRNA under the supervision of CRNA and MDA. Pt with poor dentition, remains the same as pre-op.

## 2019-09-08 NOTE — Discharge Instructions (Signed)
   [facility]  Discharge Instructions   Carotid Endarterectomy (CEA)  Please refer to the following instructions for your post-procedure care. Your surgeon or physician assistant will discuss any changes with you.  Activity  You are encouraged to walk as much as you can. You can slowly return to normal activities but must avoid strenuous activity and heavy lifting until your doctor tell you it's OK. Avoid activities such as vacuuming or swinging a golf club. You can drive after one week if you are comfortable and you are no longer taking prescription pain medications. It is normal to feel tired for serval weeks after your surgery. It is also normal to have difficulty with sleep habits, eating, and bowel movements after surgery. These will go away with time.  Bathing/Showering  You may shower after you come home. Do not soak in a bathtub, hot tub, or swim until the incision heals completely.  Incision Care  Shower every day. Clean your incision with mild soap and water. Pat the area dry with a clean towel. You do not need a bandage unless otherwise instructed. Do not apply any ointments or creams to your incision. You may have skin glue on your incision. Do not peel it off. It will come off on its own in about one week. Your incision may feel thickened and raised for several weeks after your surgery. This is normal and the skin will soften over time. For Men Only: It's OK to shave around the incision but do not shave the incision itself for 2 weeks. It is common to have numbness under your chin that could last for several months.  Diet  Resume your normal diet. There are no special food restrictions following this procedure. A low fat/low cholesterol diet is recommended for all patients with vascular disease. In order to heal from your surgery, it is CRITICAL to get adequate nutrition. Your body requires vitamins, minerals, and protein. Vegetables are the best  source of vitamins and minerals. Vegetables also provide the perfect balance of protein. Processed food has little nutritional value, so try to avoid this.        Medications  Resume taking all of your medications unless your doctor or physician assistant tells you not to. If your incision is causing pain, you may take over-the- counter pain relievers such as acetaminophen (Tylenol). If you were prescribed a stronger pain medication, please be aware these medications can cause nausea and constipation. Prevent nausea by taking the medication with a snack or meal. Avoid constipation by drinking plenty of fluids and eating foods with a high amount of fiber, such as fruits, vegetables, and grains. Do not take Tylenol if you are taking prescription pain medications.  Follow Up  Our office will schedule a follow up appointment 2-3 weeks following discharge.  Please call us immediately for any of the following conditions  Increased pain, redness, drainage (pus) from your incision site. Fever of 101 degrees or higher. If you should develop stroke (slurred speech, difficulty swallowing, weakness on one side of your body, loss of vision) you should call 911 and go to the nearest emergency room.  Reduce your risk of vascular disease:  Stop smoking. If you would like help call [facility] at 1-800-QUIT-NOW (1-800-784-8669) or [place] at 336-586-4000. Manage your cholesterol Maintain a desired weight Control your diabetes Keep your blood pressure down  If you have any questions, please call the office at [phone].   

## 2019-09-08 NOTE — Transfer of Care (Signed)
Immediate Anesthesia Transfer of Care Note  Patient: Corey Bennett  Procedure(s) Performed: LEFT CAROTID ENDARTERECTOMY (Left Neck) PATCH ANGIOPLASTY USING XENOSURE BIOLOGIC PATCH 1x6cm (Left Neck)  Patient Location: PACU  Anesthesia Type:General  Level of Consciousness: drowsy  Airway & Oxygen Therapy: Patient Spontanous Breathing and Patient connected to face mask oxygen  Post-op Assessment: Report given to RN, Post -op Vital signs reviewed and stable and Patient moving all extremities  Post vital signs: Reviewed and stable  Last Vitals:  Vitals Value Taken Time  BP 131/55 09/08/19 1001  Temp    Pulse 82 09/08/19 1006  Resp 17 09/08/19 1006  SpO2 100 % 09/08/19 1006  Vitals shown include unvalidated device data.  Last Pain:  Vitals:   09/08/19 0631  TempSrc:   PainSc: 0-No pain         Complications: No complications documented.

## 2019-09-08 NOTE — Anesthesia Procedure Notes (Signed)
Arterial Line Insertion Start/End7/08/2019 8:05 AM, 09/08/2019 8:11 AM Performed by: Beryle Lathe, MD, Elliot Dally, CRNA, CRNA  Patient location: OR. Preanesthetic checklist: patient identified, IV checked, site marked, risks and benefits discussed, surgical consent, monitors and equipment checked, pre-op evaluation, timeout performed and anesthesia consent Right, radial was placed Catheter size: 20 Fr Hand hygiene performed  and maximum sterile barriers used   Attempts: 1 Procedure performed using ultrasound guided technique. Ultrasound Notes:anatomy identified, needle tip was noted to be adjacent to the nerve/plexus identified and no ultrasound evidence of intravascular and/or intraneural injection Following insertion, dressing applied and Biopatch. Post procedure assessment: normal and unchanged  Patient tolerated the procedure well with no immediate complications.

## 2019-09-08 NOTE — Op Note (Signed)
    NAME: Corey Bennett    MRN: [id number] DOB: 05-02-1960    DATE OF OPERATION: 09/08/2019  PREOP DIAGNOSIS:    Symptomatic left carotid stenosis  POSTOP DIAGNOSIS:    Same  PROCEDURE:    Left carotid endarterectomy with bovine pericardial patch angioplasty  SURGEON: Di Kindle. Edilia Bo, MD  ASSIST: Aggie Moats, PA  ANESTHESIA: General  EBL: Minimal  INDICATIONS:    Corey Bennett is a 59 y.o. 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  He was found to have a tight left carotid stenosis and left carotid endarterectomy was recommended in order to lower his risk of future stroke.  Of note he had a less than 30% right carotid stenosis.  FINDINGS:   90% left carotid stenosis which was fairly focal  TECHNIQUE:   The patient was taken to the operating room and received a general anesthetic.  Arterial line was placed by anesthesia.  The left neck was prepped and draped in the usual sterile fashion.  An incision was made along the anterior border of the sternocleidomastoid and the dissection carried down to the common carotid artery which was dissected free and controlled with Rummel tourniquet.  The patient was heparinized.  ACT was monitored.  The facial vein was divided between 2-0 silk ties.  The internal carotid artery was controlled above the plaque.  The external carotid artery and superior thyroid arteries were controlled.  Clamps were then placed on the internal than the common then the external carotid artery.  A longitudinal arteriotomy was made in the common carotid artery and this was extended through the plaque into the internal carotid artery.  A 10 shunt was placed into the internal carotid artery, backbled and then placed into the common carotid artery and secured with Rummel tourniquet.  Flow was reestablished to the shunt.  Flow was checked with the Doppler.  An endarterectomy plane was established proximally and the plaque was sharply divided.   Eversion endarterectomy was performed of the external carotid artery.  Distally there was a nice tapering the plaque and no tacking sutures were required.  The artery was irrigated with copious amounts of heparin and dextran and all loose debris removed.  The bovine pericardial patch was then sewn using continuous 6-0 Prolene suture.  Prior to completing the patch closure the shunt was removed.  The artery was backbled and flushed appropriately and the anastomosis completed.  Flow was reestablished first to the external carotid artery and into the internal carotid artery.  At the completion was a good Doppler signal distal to the patch with good diastolic flow.  The heparin was partially reversed with protamine.  The wound was then closed with a deep layer of 3-0 Vicryl, the platysma was closed with running 3-0 Vicryl, and the skin was closed with 4-0 Vicryl.  Dermabond was applied.  The patient awoke at his neurologic baseline.  Given the complexity of the case a first assistant was necessary in order to expedient the procedure and safely perform the technical aspects of the operation.  Waverly Ferrari, MD, FACS [facility]  DATE OF DICTATION:   09/08/2019

## 2019-09-08 NOTE — Anesthesia Procedure Notes (Signed)
Arterial Line Insertion Start/End7/08/2019 7:40 AM, 09/08/2019 7:49 AM Performed by: Beryle Lathe, MD, anesthesiologist  Patient location: Pre-op. Preanesthetic checklist: patient identified, IV checked, risks and benefits discussed, surgical consent, monitors and equipment checked, pre-op evaluation, timeout performed and anesthesia consent Lidocaine 1% used for infiltration and patient sedated Left, brachial was placed Catheter size: 20 G Hand hygiene performed   Attempts: 3 (Previous attempts by CRNA on left radial unsuccessful) Procedure performed using ultrasound guided technique. Ultrasound Notes:anatomy identified, needle tip was noted to be adjacent to the nerve/plexus identified, no ultrasound evidence of intravascular and/or intraneural injection and image(s) printed for medical record Following insertion, dressing applied and Biopatch. Post procedure assessment: unchanged and normal  Patient tolerated the procedure well with no immediate complications.

## 2019-09-09 ENCOUNTER — Ambulatory Visit: Payer: BC Managed Care – PPO

## 2019-09-09 ENCOUNTER — Encounter (HOSPITAL_COMMUNITY): Payer: Self-pay | Admitting: Vascular Surgery

## 2019-09-09 LAB — BASIC METABOLIC PANEL
Anion gap: 11 (ref 5–15)
BUN: 15 mg/dL (ref 6–20)
CO2: 24 mmol/L (ref 22–32)
Calcium: 8.1 mg/dL — ABNORMAL LOW (ref 8.9–10.3)
Chloride: 104 mmol/L (ref 98–111)
Creatinine, Ser: 1.23 mg/dL (ref 0.61–1.24)
GFR calc Af Amer: >60 mL/min (ref >60)
GFR calc non Af Amer: >60 mL/min (ref >60)
Glucose, Bld: 119 mg/dL — ABNORMAL HIGH (ref 70–99)
Potassium: 3.3 mmol/L — ABNORMAL LOW (ref 3.5–5.1)
Sodium: 139 mmol/L (ref 135–145)

## 2019-09-09 LAB — URINALYSIS, ROUTINE W REFLEX MICROSCOPIC
Bacteria, UA: NONE SEEN
Bilirubin Urine: NEGATIVE
Glucose, UA: 50 mg/dL — AB
Hgb urine dipstick: NEGATIVE
Ketones, ur: 5 mg/dL — AB
Leukocytes,Ua: NEGATIVE
Nitrite: NEGATIVE
Protein, ur: 100 mg/dL — AB
Specific Gravity, Urine: 1.018 (ref 1.005–1.030)
pH: 5 (ref 5.0–8.0)

## 2019-09-09 LAB — GLUCOSE, CAPILLARY
Glucose-Capillary: 105 mg/dL — ABNORMAL HIGH (ref 70–99)
Glucose-Capillary: 111 mg/dL — ABNORMAL HIGH (ref 70–99)
Glucose-Capillary: 129 mg/dL — ABNORMAL HIGH (ref 70–99)

## 2019-09-09 LAB — CBC
HCT: 28.5 % — ABNORMAL LOW (ref 39.0–52.0)
Hemoglobin: 9.8 g/dL — ABNORMAL LOW (ref 13.0–17.0)
MCH: 29.1 pg (ref 26.0–34.0)
MCHC: 34.4 g/dL (ref 30.0–36.0)
MCV: 84.6 fL (ref 80.0–100.0)
Platelets: 278 10*3/uL (ref 150–400)
RBC: 3.37 MIL/uL — ABNORMAL LOW (ref 4.22–5.81)
RDW: 13.6 % (ref 11.5–15.5)
WBC: 10.3 10*3/uL (ref 4.0–10.5)
nRBC: 0 % (ref 0.0–0.2)

## 2019-09-09 MED ORDER — HYDROCODONE-ACETAMINOPHEN 5-325 MG PO TABS: 1.0000 | ORAL_TABLET | Freq: Four times a day (QID) | ORAL | 0 refills | Status: DC | PRN

## 2019-09-09 NOTE — Progress Notes (Signed)
   VASCULAR SURGERY ASSESSMENT & PLAN:   POD 1 LEFT CAROTID ENDARTERECTOMY: He is doing well.  He is at his baseline.  He can be discharged back to the skilled nursing facility and continue therapy there.  He is on aspirin and is on a statin.  After discussion with his son we will leave him on Plavix and let him discuss this with his primary care physician.  I have explained that from my standpoint he does not need to be on Plavix.  SUBJECTIVE:   No specific complaints.  PHYSICAL EXAM:   Vitals:   09/09/19 0100 09/09/19 0458 09/09/19 0503 09/09/19 0653  BP:  (!) 126/46    Pulse:  67    Resp: 16 (!) 21 20 (!) 21  Temp:  97.9 F (36.6 C)    TempSrc:  Oral    SpO2:  97%    Weight:      Height:       Neurologic exam is at its baseline with significant right-sided weakness which she had preop His incision looks fine.  LABS:   Lab Results  Component Value Date   WBC 13.5 (H) 09/04/2019   HGB 13.4 09/04/2019   HCT 39.6 09/04/2019   MCV 87.4 09/04/2019   PLT 337 09/04/2019   Lab Results  Component Value Date   CREATININE 1.56 (H) 09/04/2019   Lab Results  Component Value Date   INR 1.1 09/04/2019   CBG (last 3)  Recent Labs    09/08/19 0621 09/08/19 1003  GLUCAP 107* 157*    PROBLEM LIST:    Active Problems:   Carotid artery stenosis   CURRENT MEDS:   . amLODipine  10 mg Oral Daily  . aspirin EC  81 mg Oral Daily  . atorvastatin  80 mg Oral Daily  . docusate sodium  100 mg Oral Daily  . insulin aspart  5 Units Subcutaneous TID WC  . insulin glargine  12 Units Subcutaneous QHS  . losartan  25 mg Oral Daily  . pantoprazole  40 mg Oral Daily  . senna  1 tablet Oral Daily  . tamsulosin  0.4 mg Oral QHS    Waverly Ferrari Office: [phone] 09/09/2019

## 2019-09-09 NOTE — [place] FL2 (Addendum)
[place] MEDICAID FL2 LEVEL OF CARE SCREENING TOOL     IDENTIFICATION  Patient Name: Corey Bennett Birthdate: 08-Nov-1960 Sex: male Admission Date (Current Location): 09/08/2019  [facility] and [place] Number:  Producer, television/film/video and Address:  [facility] [place], [address], [place], [place] [address]      Provider Number: [id number]  Attending Physician Name and Address:  Chuck Hint, *  Relative Name and Phone Number:  Gordy Councilman    Current Level of Care: Hospital Recommended Level of Care: Assistant Living Facility (ALF) Prior Approval Number:    Date Approved/Denied:   PASRR Number:    Discharge Plan: Other (Comment) (ALF)    Current Diagnoses: Patient Active Problem List   Diagnosis Date Noted  . Carotid artery stenosis 09/08/2019    Orientation RESPIRATION BLADDER Height & Weight     Self, Time, Situation, Place  Normal Continent Weight: 158 lb (71.7 kg) Height:  6\' 1"  (185.4 cm)  BEHAVIORAL SYMPTOMS/MOOD NEUROLOGICAL BOWEL NUTRITION STATUS      Continent Diet (regular)  AMBULATORY STATUS COMMUNICATION OF NEEDS Skin     Verbally  (closed incision, neck liquid skin adhesive)                       Personal Care Assistance Level of Assistance  Bathing, Dressing, Feeding Bathing Assistance: Limited assistance Feeding assistance: Independent Dressing Assistance: Limited assistance     Functional Limitations Info  Sight, Speech, Hearing Sight Info: Adequate Hearing Info: Adequate Speech Info: Impaired    SPECIAL CARE FACTORS FREQUENCY                       Contractures Contractures Info: Not present    Additional Factors Info  Code Status, Allergies Code Status Info: FULL Allergies Info: NKA           Current Medications (09/09/2019):  This is the current hospital active medication list Current Facility-Administered Medications  Medication Dose Route Frequency Provider Last Rate Last Admin  . 0.9 %   sodium chloride infusion  500 mL Intravenous Once PRN 11/10/2019, PA-C      . acetaminophen (TYLENOL) tablet 325-650 mg  325-650 mg Oral Q4H PRN Emilie Rutter, PA-C       Or  . acetaminophen (TYLENOL) suppository 325-650 mg  325-650 mg Rectal Q4H PRN Emilie Rutter, PA-C      . alum & mag hydroxide-simeth (MAALOX/MYLANTA) 200-200-20 MG/5ML suspension 15-30 mL  15-30 mL Oral Q2H PRN 06-26-1974, PA-C      . amLODipine (NORVASC) tablet 10 mg  10 mg Oral Daily Emilie Rutter, PA-C   10 mg at 09/09/19 0818  . aspirin EC tablet 81 mg  81 mg Oral Daily 11/10/19, PA-C   81 mg at 09/09/19 0817  . atorvastatin (LIPITOR) tablet 80 mg  80 mg Oral Daily 11/10/19, PA-C   80 mg at 09/09/19 0818  . docusate sodium (COLACE) capsule 100 mg  100 mg Oral Daily 11/10/19, PA-C   100 mg at 09/09/19 0818  . guaiFENesin-dextromethorphan (ROBITUSSIN DM) 100-10 MG/5ML syrup 15 mL  15 mL Oral Q4H PRN 11/10/19, PA-C      . hydrALAZINE (APRESOLINE) injection 5 mg  5 mg Intravenous Q20 Min PRN Emilie Rutter, PA-C      . insulin aspart (novoLOG) injection 5 Units  5 Units Subcutaneous TID WC Emilie Rutter, MD      . insulin  glargine (LANTUS) injection 12 Units  12 Units Subcutaneous QHS Emilie Rutter, PA-C   12 Units at 09/08/19 2017  . labetalol (NORMODYNE) injection 10 mg  10 mg Intravenous Q10 min PRN Emilie Rutter, PA-C      . losartan (COZAAR) tablet 25 mg  25 mg Oral Daily Emilie Rutter, PA-C   25 mg at 09/09/19 0818  . magnesium sulfate IVPB 2 g 50 mL  2 g Intravenous Daily PRN Emilie Rutter, PA-C      . metoprolol tartrate (LOPRESSOR) injection 2-5 mg  2-5 mg Intravenous Q2H PRN Emilie Rutter, PA-C      . morphine 2 MG/ML injection 2 mg  2 mg Intravenous Q2H PRN Emilie Rutter, PA-C      . ondansetron (ZOFRAN) injection 4 mg  4 mg Intravenous Q6H PRN Emilie Rutter, PA-C      . oxyCODONE-acetaminophen (PERCOCET/ROXICET) 5-325 MG per tablet  1-2 tablet  1-2 tablet Oral Q4H PRN Emilie Rutter, PA-C      . pantoprazole (PROTONIX) EC tablet 40 mg  40 mg Oral Daily Emilie Rutter, PA-C   40 mg at 09/09/19 0819  . phenol (CHLORASEPTIC) mouth spray 1 spray  1 spray Mouth/Throat PRN Emilie Rutter, PA-C      . potassium chloride SA (KLOR-CON) CR tablet 20-40 mEq  20-40 mEq Oral Daily PRN Emilie Rutter, PA-C      . senna (SENOKOT) tablet 8.6 mg  1 tablet Oral Daily Emilie Rutter, PA-C   8.6 mg at 09/09/19 0817  . tamsulosin (FLOMAX) capsule 0.4 mg  0.4 mg Oral QHS Emilie Rutter, PA-C   0.4 mg at 09/08/19 2016     Discharge Medications: Please see discharge summary for a list of discharge medications.  Relevant Imaging Results:  Relevant Lab Results:   Additional Information SSN [id number]  Eduard Roux, [place]

## 2019-09-09 NOTE — Progress Notes (Signed)
Order received to discharge patient back to his ALF.  Telemetry monitor removed and CCMD notified.  PIV access removed.  Discharge instructions, follow up, medications and instructions for their use discussed with patient.  Report called to [facility].

## 2019-09-09 NOTE — Evaluation (Signed)
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  Also with HTN, DM, hypercholesterol and h/o tobacco use.  Admitted for L carotid endarterectomy.  Clinical Impression  Patient presents with mobility close to recent baseline.  He should be able to function safely at ALF, but feel further PT in outpatient setting is indicated.  Gave referral form to sister in the room to take to PCP.  He is still receiving speech at facility, but once scheduled for outpatient stroke rehab program should be able to exit.  He remains a fall risk, but can safely negotiate in his ALF with intermittent assist.  Will sign off as likely for d/c today.    Follow Up Recommendations Outpatient PT    Equipment Recommendations  None recommended by PT    Recommendations for Other Services       Precautions / Restrictions Precautions Precautions: Fall Precaution Comments: R weakness      Mobility  Bed Mobility Overal bed mobility: Modified Independent                Transfers Overall transfer level: Needs assistance   Transfers: Sit to/from Stand Sit to Stand: Supervision         General transfer comment: for safety/balance  Ambulation/Gait Ambulation/Gait assistance: Supervision;Min guard Gait Distance (Feet): 200 Feet Assistive device: None Gait Pattern/deviations: Step-through pattern;Decreased stride length;Wide base of support     General Gait Details: cross steps with head turns/nods during testing, minguard for safety, but no overt LOB  Stairs            Wheelchair Mobility    Modified Rankin (Stroke Patients Only)       Balance                                 Standardized Balance Assessment Standardized Balance Assessment : Dynamic Gait Index   Dynamic  Gait Index Level Surface: Mild Impairment Change in Gait Speed: Mild Impairment Gait with Horizontal Head Turns: Mild Impairment Gait with Vertical Head Turns: Mild Impairment Gait and Pivot Turn: Normal Step Over Obstacle: Mild Impairment Step Around Obstacles: Normal Steps:  (did not test)       Pertinent Vitals/Pain Pain Assessment: Faces Faces Pain Scale: No hurt    Home Living Family/patient expects to be discharged to:: Assisted living                 Additional Comments: [facility]    Prior Function Level of Independence: Needs assistance   Gait / Transfers Assistance Needed: walking without device at ALF           Hand Dominance        Extremity/Trunk Assessment   Upper Extremity Assessment Upper Extremity Assessment: RUE deficits/detail RUE Deficits / Details: residual weakness from CVA, but uses R UE in function    Lower Extremity Assessment Lower Extremity Assessment: RLE deficits/detail RLE Deficits / Details: weakness evident with ambulation, but functional       Communication   Communication: Expressive difficulties;Receptive difficulties (aphasia)  Cognition Arousal/Alertness: Awake/alert Behavior During Therapy: WFL for tasks assessed/performed Overall Cognitive Status: Difficult to assess  General Comments General comments (skin integrity, edema, etc.): kicked obstacles first time stepping over, but cleared it second time, some staggering to one side with head turns with cross step to correct    Exercises     Assessment/Plan    PT Assessment All further PT needs can be met in the next venue of care  PT Problem List         PT Treatment Interventions      PT Goals (Current goals can be found in the Care Plan section)  Acute Rehab PT Goals PT Goal Formulation: All assessment and education complete, DC therapy    Frequency     Barriers to discharge         Co-evaluation               AM-PAC PT "6 Clicks" Mobility  Outcome Measure Help needed turning from your back to your side while in a flat bed without using bedrails?: None Help needed moving from lying on your back to sitting on the side of a flat bed without using bedrails?: None Help needed moving to and from a bed to a chair (including a wheelchair)?: None Help needed standing up from a chair using your arms (e.g., wheelchair or bedside chair)?: None Help needed to walk in hospital room?: A Little Help needed climbing 3-5 steps with a railing? : A Little 6 Click Score: 22    End of Session   Activity Tolerance: Patient tolerated treatment well Patient left: in bed;with call bell/phone within reach;with family/visitor present   PT Visit Diagnosis: Other abnormalities of gait and mobility (R26.89);Other symptoms and signs involving the nervous system (R29.898)    Time: 8828-0034 PT Time Calculation (min) (ACUTE ONLY): 29 min   Charges:   PT Evaluation $PT Eval Low Complexity: 1 Low PT Treatments $Gait Training: 8-22 mins        Magda Kiel, PT Acute Rehabilitation Services JZPHX:[phone] Office:[phone] 09/09/2019   Reginia Naas 09/09/2019, 2:49 PM

## 2019-09-09 NOTE — Discharge Summary (Signed)
Discharge Summary     Corey Bennett 04-Feb-1961 59 y.o. male  [id number]  Admission Date: 09/08/2019  Discharge Date: 09/09/19  Physician: Chuck Hint, *  Admission Diagnosis: Carotid artery stenosis [I65.29]  Discharge Day services:    see progress note 09/09/19  Hospital Course:  The patient was admitted to the hospital and taken to the operating room on 09/08/2019 and underwent left carotid endarterectomy.  The pt tolerated the procedure well and was transported to the PACU in good condition.   By POD 1, the pt neuro status remained at baseline.  He has significant right sided weakness however this is unchanged compared to pre-op.  He will be discharged back to his skilled nursing facility this morning.  He will follow up with Dr. Edilia Bo in 2-3 weeks.  He will be discharged in stable condition.   No results for input(s): NA, K, CL, CO2, GLUCOSE, BUN, CALCIUM in the last 72 hours.  Invalid input(s): CRATININE Recent Labs    09/09/19 0623  WBC 10.3  HGB 9.8*  HCT 28.5*  PLT 278   No results for input(s): INR in the last 72 hours.     Discharge Diagnosis:  Carotid artery stenosis [I65.29]  Secondary Diagnosis: Patient Active Problem List   Diagnosis Date Noted  . Carotid artery stenosis 09/08/2019   Past Medical History:  Diagnosis Date  . Carotid artery occlusion   . Diabetes (HCC)   . Hypercholesterolemia   . Hypertension   . 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 Stroke [facility])     Allergies as of 09/09/2019   No Known Allergies     Medication List    TAKE these medications   amLODipine 10 MG tablet Commonly known as: NORVASC Take 10 mg by mouth daily. (0600)   aspirin EC 81 MG tablet Take 81 mg by mouth daily. (0600)   atorvastatin 80 MG tablet Commonly known as: LIPITOR Take 80 mg by mouth daily. (0600)   Basaglar KwikPen 100 UNIT/ML Inject 12 Units into the skin at bedtime. (2000)   HYDROcodone-acetaminophen 5-325 MG  tablet Commonly known as: NORCO/VICODIN Take 1 tablet by mouth every 6 (six) hours as needed for moderate pain.   insulin lispro 100 UNIT/ML injection Commonly known as: HUMALOG Inject 5 Units into the skin 3 (three) times daily before meals. *HOLD INSULIN IF BLOOD GLUCOSE IS LESS THAN 120.*   losartan 25 MG tablet Commonly known as: COZAAR Take 25 mg by mouth daily. (0600)   metFORMIN 1000 MG tablet Commonly known as: GLUCOPHAGE Take 1,000 mg by mouth 2 (two) times daily with a meal. (0800 & 1700)   Plavix 75 MG tablet Generic drug: clopidogrel Take 1 tablet (75 mg total) by mouth daily.   senna 8.6 MG Tabs tablet Commonly known as: SENOKOT Take 1 tablet by mouth daily. (0600)   tamsulosin 0.4 MG Caps capsule Commonly known as: FLOMAX Take 0.4 mg by mouth at bedtime. (2000)   Vitamin D-3 125 MCG (5000 UT) Tabs Take 5,000 Units by mouth daily. (0600)        Discharge Instructions:   [facility] Discharge Instructions Carotid Endarterectomy (CEA)  Please refer to the following instructions for your post-procedure care. Your surgeon or physician assistant will discuss any changes with you.  Activity  You are encouraged to walk as much as you can. You can slowly return to normal activities but must avoid strenuous activity and heavy lifting until your  doctor tell you it's OK. Avoid activities such as vacuuming or swinging a golf club. You can drive after one week if you are comfortable and you are no longer taking prescription pain medications. It is normal to feel tired for serval weeks after your surgery. It is also normal to have difficulty with sleep habits, eating, and bowel movements after surgery. These will go away with time.  Bathing/Showering  You may shower after you come home. Do not soak in a bathtub, hot tub, or swim until the incision heals completely.  Incision Care  Shower every day. Clean your incision with mild soap  and water. Pat the area dry with a clean towel. You do not need a bandage unless otherwise instructed. Do not apply any ointments or creams to your incision. You may have skin glue on your incision. Do not peel it off. It will come off on its own in about one week. Your incision may feel thickened and raised for several weeks after your surgery. This is normal and the skin will soften over time. For Men Only: It's OK to shave around the incision but do not shave the incision itself for 2 weeks. It is common to have numbness under your chin that could last for several months.  Diet  Resume your normal diet. There are no special food restrictions following this procedure. A low fat/low cholesterol diet is recommended for all patients with vascular disease. In order to heal from your surgery, it is CRITICAL to get adequate nutrition. Your body requires vitamins, minerals, and protein. Vegetables are the best source of vitamins and minerals. Vegetables also provide the perfect balance of protein. Processed food has little nutritional value, so try to avoid this.  Medications  Resume taking all of your medications unless your doctor or physician assistant tells you not to.  If your incision is causing pain, you may take over-the- counter pain relievers such as acetaminophen (Tylenol). If you were prescribed a stronger pain medication, please be aware these medications can cause nausea and constipation.  Prevent nausea by taking the medication with a snack or meal. Avoid constipation by drinking plenty of fluids and eating foods with a high amount of fiber, such as fruits, vegetables, and grains. Do not take Tylenol if you are taking prescription pain medications.  Follow Up  Our office will schedule a follow up appointment 2-3 weeks following discharge.  Please call us immediately for any of the following conditions  . Increased pain, redness, drainage (pus) from your incision site. . Fever of 101  degrees or higher. . If you should develop stroke (slurred speech, difficulty swallowing, weakness on one side of your body, loss of vision) you should call 911 and go to the nearest emergency room. .  Reduce your risk of vascular disease:  . Stop smoking. If you would like help call [facility] at 1-800-QUIT-NOW ((212)699-6944) or [place] at 319-401-6854. . Manage your cholesterol . Maintain a desired weight . Control your diabetes . Keep your blood pressure down .  If you have any questions, please call the office at [phone].   Disposition: SNF  Patient's condition: is Good  Follow up: 1. Dr. Edilia Bo in 2 weeks.   Emilie Rutter, PA-C [facility] [phone]   --- For [facility] Registry use ---   Modified Rankin score at D/C (0-6): 2  IV medication needed for:  1. Hypertension: No 2. Hypotension: No  Post-op Complications: No  1. Post-op CVA or TIA: No  If yes: Event classification (right eye, left eye, right cortical, left cortical, verterobasilar, other):   If yes: Timing of event (intra-op, <6 hrs post-op, >=6 hrs post-op, unknown):   2. CN injury: No  If yes: CN  injuried   3. Myocardial infarction: No  If yes: Dx by (EKG or clinical, Troponin):   4.  CHF: No  5.  Dysrhythmia (new): No  6. Wound infection: No  7. Reperfusion symptoms: No  8. Return to OR: No  If yes: return to OR for (bleeding, neurologic, other CEA incision, other):   Discharge medications: Statin use:  Yes ASA use:  Yes   Beta blocker use:  No ACE-Inhibitor use:  No  ARB use:  Yes CCB use: Yes P2Y12 Antagonist use: Yes, [x ] Plavix, [ ]  Plasugrel, [ ]  Ticlopinine, [ ]  Ticagrelor, [ ]  Other, [ ]  No for medical reason, [ ]  Non-compliant, [ ]  Not-indicated Anti-coagulant use:  No, [ ]  Warfarin, [ ]  Rivaroxaban, [ ]  Dabigatran,

## 2019-09-09 NOTE — Progress Notes (Signed)
Foley removed per MD order without difficulty.  Will continue to monitor.

## 2019-09-09 NOTE — TOC Initial Note (Signed)
Transition of Care [facility]) - Initial/Assessment Note    Patient Details  Name: Halo Laski MRN: [id number] Date of Birth: June 10, 1960  [facility] CM/SW Contact:    Eduard Roux, LCSWA Phone Number: 09/09/2019, 1:55 PM  Clinical Narrative:                  CSW visit with patient at bedside. Patient was sleep. CSW spoke with his sister Marcelino Duster, and his sister in law Lurena Joiner, that was present in patient's room. CSW introduced self and explained role. Patient's family confirmed patient reside at [facility] and agreeable to patient returning there.   CSW contacted [facility]- they request discharge information be faxed to [phone]. No covid test is required.   CSW will continue to follow and assist with discharge planning.    Barriers to Discharge: Continued Medical Work up   Patient Goals and CMS Choice        Expected Discharge Plan and Services   In-house Referral: Clinical Social Work     Living arrangements for the past 2 months: Assisted Living Facility Expected Discharge Date: 09/09/19                                    Prior Living Arrangements/Services Living arrangements for the past 2 months: Assisted Living Facility Lives with:: Self Patient language and need for interpreter reviewed:: No        Need for Family Participation in Patient Care: Yes (Comment) Care giver support system in place?: Yes (comment)   Criminal Activity/Legal Involvement Pertinent to Current Situation/Hospitalization: No - Comment as needed  Activities of Daily Living      Permission Sought/Granted Permission sought to share information with : Family Supports Permission granted to share information with : Yes, Verbal Permission Granted  Share Information with NAME: Rondald Tupou     Permission granted to share info w Relationship: brother  Permission granted to share info w Contact Information: [phone]  Emotional Assessment Appearance::  Appears stated age Attitude/Demeanor/Rapport: Unable to Assess (patient sleep) Affect (typically observed): Unable to Assess Orientation: : Oriented to Self, Oriented to Place, Oriented to  Time, Oriented to Situation Alcohol / Substance Use: Not Applicable Psych Involvement: No (comment)  Admission diagnosis:  Carotid artery stenosis [I65.29] Patient Active Problem List   Diagnosis Date Noted  . Carotid artery stenosis 09/08/2019   PCP:  Patient, No Pcp Per Pharmacy:   [facility] [place], [place] - [address] [place] [address] Phone: [phone] Fax: [phone]     Social Determinants of Health (SDOH) Interventions    Readmission Risk Interventions No flowsheet data found.

## 2019-09-09 NOTE — Progress Notes (Signed)
Pt and his sister report to this RN that pt urinated in the bathroom after working with OT at about 12pm.  Will continue to monitor.

## 2019-09-09 NOTE — Progress Notes (Signed)
RN spoke with Vascular PA who instructed to remove Foley this am and wait until pt urinates to DC back to SNF today.  Will continue to monitor.

## 2019-09-09 NOTE — Progress Notes (Signed)
Day shift nurse reported Pt has not urinated since arriving to the unit and reported the bladder scan had revealed . She also reported that she had paged Dr. Edilia Bo nut his nurse had said he was unavailable at the time. At 1950 Dr. Myra Gianotti called me stating he had spoken to Dr. Edilia Bo and informed me that we should place a foley catheter for the night and to remove it in the morning. Will continue to monitor. Willa Frater, RN

## 2019-09-09 NOTE — TOC Transition Note (Signed)
Transition of Care [facility]) - CM/SW Discharge Note   Patient Details  Name: Corey Bennett MRN: [id number] Date of Birth: 1960-09-10  [facility] CM/SW Contact:  Eduard Roux, LCSWA Phone Number: 09/09/2019, 5:12 PM   Clinical Narrative:      Patient will DC to: [facility]  DC Date: 09/09/19 Family Notified: sister Marcelino Duster, and sister in Engineer, structural Transport By: family member    Per MD patient is ready for discharge. RN, patient, and facility notified of DC. Discharge Summary sent to facility. RN given number for [phone].  Clinical Social Worker signing off.  Antony Blackbird, MSW, LCSWA Clinical Social Worker    Final next level of care: Assisted Living Barriers to Discharge: Barriers Resolved   Patient Goals and CMS Choice        Discharge Placement              Patient chooses bed at:  [facility]) Patient to be transferred to facility by: family Name of family member notified: patient's daughter in law and sister Patient and family notified of of transfer: 09/09/19  Discharge Plan and Services In-house Referral: Clinical Social Work                                   Social Determinants of Health (SDOH) Interventions     Readmission Risk Interventions No flowsheet data found.

## 2019-09-21 ENCOUNTER — Other Ambulatory Visit: Payer: Self-pay

## 2019-09-21 ENCOUNTER — Ambulatory Visit: Payer: BC Managed Care – PPO | Attending: Internal Medicine

## 2019-09-21 DIAGNOSIS — M6281 Muscle weakness (generalized): Secondary | ICD-10-CM | POA: Diagnosis present

## 2019-09-21 DIAGNOSIS — I69351 Hemiplegia and hemiparesis following cerebral infarction affecting right dominant side: Secondary | ICD-10-CM | POA: Insufficient documentation

## 2019-09-21 DIAGNOSIS — R41841 Cognitive communication deficit: Secondary | ICD-10-CM | POA: Insufficient documentation

## 2019-09-21 DIAGNOSIS — R4701 Aphasia: Secondary | ICD-10-CM | POA: Diagnosis not present

## 2019-09-21 DIAGNOSIS — M25631 Stiffness of right wrist, not elsewhere classified: Secondary | ICD-10-CM | POA: Insufficient documentation

## 2019-09-21 DIAGNOSIS — R482 Apraxia: Secondary | ICD-10-CM | POA: Diagnosis present

## 2019-09-21 DIAGNOSIS — M25641 Stiffness of right hand, not elsewhere classified: Secondary | ICD-10-CM | POA: Diagnosis present

## 2019-09-21 NOTE — Patient Instructions (Signed)
   talkpath therapy [email or web address]  Working with this for 20-30 minutes 3x/day would be helpful

## 2019-09-21 NOTE — Therapy (Signed)
[facility] [address] [place], [place], [address] Phone: [phone]   Fax:  [phone]  Speech Language Pathology Evaluation  Patient Details  Name: Corey Bennett MRN: [id number] Date of Birth: 11/02/1960 Referring Provider (SLP): Burton Apley, MD   Encounter Date: 09/21/2019   End of Session - 09/21/19 2341    Visit Number 1    Number of Visits 17    Date for SLP Re-Evaluation 12/11/19   90 days   Authorization - Visit Number 1    Authorization - Number of Visits 15   if split evently between OT and ST   SLP Start Time 1535    SLP Stop Time  1630    SLP Time Calculation (min) 55 min    Activity Tolerance Patient tolerated treatment well           Past Medical History:  Diagnosis Date  . Carotid artery occlusion   . Diabetes (HCC)   . Hypercholesterolemia   . Hypertension   . 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 Stroke [facility])     Past Surgical History:  Procedure Laterality Date  . ENDARTERECTOMY Left 09/08/2019   Procedure: LEFT CAROTID ENDARTERECTOMY;  Surgeon: Chuck Hint, MD;  Location: [facility] OR;  Service: Vascular;  Laterality: Left;  . PATCH ANGIOPLASTY Left 09/08/2019   Procedure: PATCH ANGIOPLASTY USING XENOSURE BIOLOGIC PATCH 1x6cm;  Surgeon: Chuck Hint, MD;  Location: [facility] OR;  Service: Vascular;  Laterality: Left;  . TONSILLECTOMY      There were no vitals filed for this visit.   Subjective Assessment - 09/21/19 1548    Subjective "Pon  - - an  - Al." (pt, re: SLP "Who did you bring with you today?")    Patient is accompained by: Family member   brother and sister in law             SLP Evaluation [facility] - 09/21/19 1548      SLP Visit Information   SLP Received On 09/21/19    Referring Provider (SLP) Burton Apley, MD    Onset Date April 2021    Medical Diagnosis Lt MCA CVA      Subjective   Patient/Family Stated Goal Improve communication      Pain  Assessment   Currently in Pain? No/denies      General Information   HPI Pt with CVA in [place] in April 2021. Did not report to work and pt's employer called for well-check. Found down at home. Pt in CIR in [place] for approx one month then moved to [place] to an ALF due to mother unable to care for pt. Pt had all 3 disciplines at ALF but is now transitioning to OP therapies. Pt with carotid endarterecotmy 09-08-19. SLP notes scab/scar on pt's lt lateral neck.       Prior Functional Status   Cognitive/Linguistic Baseline Within functional limits    Type of Home Assisted living     Lives With Other (Comment)    Available Support Family;Available 24 hours/day    Vocation Full time Medical sales representative for [employer]     Cognition   Overall Cognitive Status Difficult to assess    Difficult to assess due to Impaired communication    Behaviors --   demonstrating frustration with decr'd expressive language     Auditory Comprehension   Overall Auditory Comprehension Impaired    Yes/No Questions Impaired    Basic  Biographical Questions 51-75% accurate   6/8   Basic Immediate Environment Questions 75-100% accurate    Complex Questions --   4/4   Other Yes/No Questions Comments` Other (comment)   logical yes/no 33% (2/6)   EffectiveTechniques Extra processing time;Stressing words;Repetition;Slowed speech    Overall Auditory Comprehension Comments 49/60 auditory verbal comprehension (yes/no). SLP suggested to pt that he use thubms up/down for yes/no due to inerchanging these verbal responses.      Verbal Expression   Overall Verbal Expression Impaired    Initiation Impaired    Automatic Speech --   apraxic/aphasic response for name; "yes" for SLP greeting   Level of Generative/Spontaneous Verbalization --   at times, rarely, word or short level, with slowed rate   Repetition Impaired    Level of Impairment Word level    Naming --   To Be Tested   Effective Techniques Other (Comment)    rate reduction     Oral Motor/Sensory Function   Overall Oral Motor/Sensory Function Impaired    Labial ROM Reduced right    Labial Strength Reduced    Lingual ROM Reduced right    Lingual Symmetry Abnormal symmetry right    Lingual Strength Reduced    Lingual Coordination Reduced      Motor Speech   Overall Motor Speech Impaired    Motor Planning Impaired    Motor Speech Errors --   oral nonverbal apraxia present occasionally   Effective Techniques Slow rate    Phonation Impaired   voice/voiceless phoneme substitution     Standardized Assessments   Standardized Assessments  Western Aphasia Battery revised                           SLP Education - 09/21/19 2339    Education Details Lynder Parents may be an option in the future, talkpath therapy, more effective communication at home    Person(s) Educated Patient;Other (comment)    Methods Explanation;Demonstration;Handout    Comprehension Verbalized understanding            SLP Short Term Goals - 09/21/19 2347      SLP SHORT TERM GOAL #1   Title pt will name 3 names from familiy, functionally, in 3 sessions    Time 4    Period Weeks   or 9 total sessions, for all STGs   Status New      SLP SHORT TERM GOAL #2   Title pt will demo understanding of yes/no questions pertinent to his environment.95% with modified independent (thumb up=yes, thumbs down=    Time 4    Period Weeks    Status New      SLP SHORT TERM GOAL #3   Title pt will indicate his preferences via functional means including communication device, 85% in 3 sessions    Time 4    Period Weeks    Status New      SLP SHORT TERM GOAL #4   Title pt will complete cognitive testing for cognitive communication defiicts PRN    Time 4    Period Weeks    Status New            SLP Long Term Goals - 09/21/19 2352      SLP LONG TERM GOAL #1   Title pt will demo understanding of yes/no and "wh" questions pertinent to his environment.90% with  modified independent (thumb up=yes, thumbs down) in 3 sessions    Time 8  Period Weeks   or 17 total sessions, for all LTGs   Status New      SLP LONG TERM GOAL #2   Title pt will demo understanding of simple conversaion of 7 minutes x3 sessions    Time 8    Period Weeks    Status New            Plan - 09/21/19 2342    Clinical Impression Statement 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 Pt demonstrated some oral non-verbal apraxia as well. Pt communication at this time is non-functional just over 3 months after his CVA. Pt appears aware of his errors as he appeared disappointed at his difficulty.Pt was slightly more successful/accurate with a slower rate of speech.    Speech Therapy Frequency 2x / week    Duration --   8 weeks or 17 total visits (pt with 30 total visits-hard stop)   Treatment/Interventions Cognitive reorganization;SLP instruction and feedback;Compensatory strategies;Internal/external aids;Environmental controls;Compensatory techniques;Patient/family education;Functional tasks;Multimodal communcation approach;Cueing hierarchy;Oral motor exercises    Potential to Achieve Goals Fair    Potential Considerations Severity of impairments;Cooperation/participation level    SLP Home Exercise Plan talk path suggested today    Consulted and Agree with Plan of Care Patient   pt remains his power or attorney, per brother          Patient will benefit from skilled therapeutic intervention in order to improve the following deficits and impairments:   Aphasia  Verbal apraxia  Cognitive communication deficit    Problem List Patient Active Problem List   Diagnosis Date Noted  . Carotid artery stenosis 09/08/2019    [facility] 09/21/2019, 11:58 PM  [facility] [address] [place], [place], [address] Phone: [phone]   Fax:   [phone]  Name: Corey Bennett MRN: [id number] Date of Birth: [birth year]

## 2019-09-28 ENCOUNTER — Ambulatory Visit: Payer: BC Managed Care – PPO

## 2019-09-28 ENCOUNTER — Other Ambulatory Visit: Payer: Self-pay

## 2019-09-28 DIAGNOSIS — R482 Apraxia: Secondary | ICD-10-CM

## 2019-09-28 DIAGNOSIS — R41841 Cognitive communication deficit: Secondary | ICD-10-CM

## 2019-09-28 DIAGNOSIS — R4701 Aphasia: Secondary | ICD-10-CM

## 2019-09-28 NOTE — Therapy (Signed)
[facility] [address] [place], [place], [address] Phone: [phone]   Fax:  [phone]  Speech Language Pathology Treatment  Patient Details  Name: Corey Bennett MRN: [id number] Date of Birth: [birth year] Referring Provider (SLP): Burton Apley, MD   Encounter Date: 09/28/2019   End of Session - 09/28/19 1359    Visit Number 2    Number of Visits 17    Date for SLP Re-Evaluation 12/11/19   90 days   Authorization - Visit Number 2    Authorization - Number of Visits 15   if split evently between OT and ST   SLP Start Time 1106    SLP Stop Time  1146    SLP Time Calculation (min) 40 min    Activity Tolerance Patient tolerated treatment well           Past Medical History:  Diagnosis Date  . Carotid artery occlusion   . Diabetes (HCC)   . Hypercholesterolemia   . Hypertension   . 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 Stroke [facility])     Past Surgical History:  Procedure Laterality Date  . ENDARTERECTOMY Left 09/08/2019   Procedure: LEFT CAROTID ENDARTERECTOMY;  Surgeon: Chuck Hint, MD;  Location: [facility] OR;  Service: Vascular;  Laterality: Left;  . PATCH ANGIOPLASTY Left 09/08/2019   Procedure: PATCH ANGIOPLASTY USING XENOSURE BIOLOGIC PATCH 1x6cm;  Surgeon: Chuck Hint, MD;  Location: [facility] OR;  Service: Vascular;  Laterality: Left;  . TONSILLECTOMY      There were no vitals filed for this visit.          ADULT SLP TREATMENT - 09/28/19 1353      General Information   Behavior/Cognition Alert;Impulsive;Requires cueing;Doesn't follow directions   possible decr'd motivation     Treatment Provided   Treatment provided Cognitive-Linquistic      Cognitive-Linquistic Treatment   Treatment focused on Aphasia;Apraxia;Cognition    Skilled Treatment Pt comlpleted more of the WAB-R today. Complete next session, as SLP thought pt was fatiguing in latter portion. Pt benefitted from  cloze phrases with object naming. In picture and body part ID, pt noted to point at stimuli immediately after SLP request and do so in a clockwise response pattern. After SLP explained that pt needed to be as accurate as possible pt performance improved. SLP wonders if pt completely motivated for change or if he is weary of the therapy process at this time. Pt's brother asked if they could have ALF bus bring him to ST and SLP encouraged him or wife to arrive with pt once/week. Lastly, pt brother inquired about [employer] as the ALF SLP introduced it for pt. Pt brother to contact [employer] and request a trial.       Assessment / Recommendations / Plan   Plan Continue with current plan of care   L[employer] trial will slightly modify pt's goals     Progression Toward Goals   Progression toward goals Progressing toward goals            SLP Education - 09/28/19 1358    Education Details [employer] trial and then SLP to make recommendation of pt's benefit from device    Person(s) Educated Patient;Caregiver(s)    Methods Explanation    Comprehension Verbalized understanding;Need further instruction            SLP Short Term Goals - 09/28/19 1639      SLP SHORT TERM GOAL #1  Title pt will name 3 names from familiy, functionally, in 3 sessions    Time 4    Period Weeks   or 9 total sessions, for all STGs   Status On-going      SLP SHORT TERM GOAL #2   Title pt will demo understanding of yes/no questions pertinent to his environment.95% with modified independent (thumb up=yes, thumbs down=    Time 4    Period Weeks    Status On-going      SLP SHORT TERM GOAL #3   Title pt will indicate his preferences via functional means including communication device, 85% in 3 sessions    Time 4    Period Weeks    Status On-going      SLP SHORT TERM GOAL #4   Title pt will complete cognitive testing for cognitive communication defiicts PRN    Time 4    Period Weeks    Status On-going              SLP Long Term Goals - 09/28/19 1640      SLP LONG TERM GOAL #1   Title pt will demo understanding of yes/no and "wh" questions pertinent to his environment.90% with modified independent (thumb up=yes, thumbs down) in 3 sessions    Time 8    Period Weeks   or 17 total sessions, for all LTGs   Status On-going      SLP LONG TERM GOAL #2   Title pt will demo understanding of simple conversaion of 7 minutes x3 sessions    Time 8    Period Weeks    Status On-going      SLP LONG TERM GOAL #3   Title Pt wil demo 85% success with simple communication using a speech generating device to ease burden of communication between family and pt (and staff and pt)    Time 8    Period Weeks    Status New            Plan - 09/28/19 1400    Clinical Impression Statement 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 Pt demonstrated some oral non-verbal apraxia as well. Trial for [employer] to begin sometime in the next 7-10 days, when brother makes call to request a trial. Pt appears somewhat aware of his errors today.Pt was slightly more successful/accurate with a slower rate of speech.SLP cont to recommend a skilled ST course focusing on functinoal communicatin as well as the feasibility of AAC device to assist pt in communicaiton.    Speech Therapy Frequency 2x / week    Duration --   8 weeks or 17 total visits (pt with 30 total visits-hard stop)   Treatment/Interventions Cognitive reorganization;SLP instruction and feedback;Compensatory strategies;Internal/external aids;Environmental controls;Compensatory techniques;Patient/family education;Functional tasks;Multimodal communcation approach;Cueing hierarchy;Oral motor exercises    Potential to Achieve Goals Fair    Potential Considerations Severity of impairments;Cooperation/participation level    SLP Home Exercise Plan talk path suggested today    Consulted and Agree with Plan of Care  Patient   pt remains his power or attorney, per brother          Patient will benefit from skilled therapeutic intervention in order to improve the following deficits and impairments:   Aphasia  Verbal apraxia  Cognitive communication deficit    Problem List Patient Active Problem List   Diagnosis Date Noted  . Carotid artery stenosis 09/08/2019    [facility] ,[place], CCC-SLP  09/28/2019,  4:42 PM  [facility] [address] [place], [place], [address] Phone: [phone]   Fax:  [phone]   Name: Corey Bennett MRN: [id number] Date of Birth: 05-08-1960

## 2019-09-30 ENCOUNTER — Other Ambulatory Visit: Payer: Self-pay

## 2019-09-30 ENCOUNTER — Encounter: Payer: Self-pay | Admitting: Vascular Surgery

## 2019-09-30 ENCOUNTER — Ambulatory Visit (INDEPENDENT_AMBULATORY_CARE_PROVIDER_SITE_OTHER): Payer: Self-pay | Admitting: Vascular Surgery

## 2019-09-30 VITALS — BP 129/76 | HR 74 | Temp 97.7°F | Resp 20 | Ht 73.0 in | Wt 151.4 lb

## 2019-09-30 DIAGNOSIS — Z48812 Encounter for surgical aftercare following surgery on the circulatory system: Secondary | ICD-10-CM

## 2019-09-30 DIAGNOSIS — I6522 Occlusion and stenosis of left carotid artery: Secondary | ICD-10-CM

## 2019-09-30 NOTE — Progress Notes (Signed)
Patient name: Corey Bennett MRN: [id number] DOB: 1961-03-01 Sex: male  REASON FOR VISIT:   Follow-up after left carotid endarterectomy.  HPI:   Corey Bennett is a pleasant 59 y.o. 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  He was found to have a tight left carotid stenosis and left carotid endarterectomy was recommended in order to lower his risk of future stroke.  Of note he had a less than 39% right carotid stenosis.  He did well postoperatively and was discharged on postoperative day #1.  He returns for his first follow-up visit.   Since I saw him in the hospital he has been doing therapy.  His right upper extremity weakness has improved.  He is here today with his daughter-in-law.  He has no specific complaints.  He is on aspirin, Plavix, and a statin.  Current Outpatient Medications  Medication Sig Dispense Refill  . amLODipine (NORVASC) 10 MG tablet Take 10 mg by mouth daily. (0600)    . aspirin EC 81 MG tablet Take 81 mg by mouth daily. (0600)    . atorvastatin (LIPITOR) 80 MG tablet Take 80 mg by mouth daily. (0600)    . Cholecalciferol (VITAMIN D-3) 125 MCG (5000 UT) TABS Take 5,000 Units by mouth daily. (0600)    . clopidogrel (PLAVIX) 75 MG tablet Take 1 tablet (75 mg total) by mouth daily. 30 tablet 11  . Insulin Glargine (BASAGLAR KWIKPEN) 100 UNIT/ML Inject 12 Units into the skin at bedtime. (2000)    . insulin lispro (HUMALOG) 100 UNIT/ML injection Inject 5 Units into the skin 3 (three) times daily before meals. *HOLD INSULIN IF BLOOD GLUCOSE IS LESS THAN 120.*    . losartan (COZAAR) 25 MG tablet Take 25 mg by mouth daily. (0600)    . metFORMIN (GLUCOPHAGE) 1000 MG tablet Take 1,000 mg by mouth 2 (two) times daily with a meal. (0800 & 1700)     No current facility-administered medications for this visit.    REVIEW OF SYSTEMS:  [X]  denotes positive finding, [ ]  denotes negative finding Vascular    Leg swelling    Cardiac    Chest pain or  chest pressure:    Shortness of breath upon exertion:    Short of breath when lying flat:    Irregular heart rhythm:    Constitutional    Fever or chills:     PHYSICAL EXAM:   Vitals:   09/30/19 1526  BP: (!) 129/76  Pulse: 74  Resp: 20  Temp: 97.7 F (36.5 C)  TempSrc: Temporal  SpO2: 96%  Weight: 68.7 kg  Height: 6\' 1"  (1.854 m)    GENERAL: The patient is a well-nourished male, in no acute distress. The vital signs are documented above. CARDIOVASCULAR: There is a regular rate and rhythm. PULMONARY: There is good air exchange bilaterally without wheezing or rales. His left neck incision is healing nicely. NEURO: His right upper extremity weakness has improved slightly as has his right lower extremity weakness.  He has good strength on the left side.  DATA:   No new data  MEDICAL ISSUES:   STATUS POST LEFT CAROTID ENDARTERECTOMY: Patient is doing well status post left carotid endarterectomy.  He had no significant stenosis on the right side.  I ordered a follow-up carotid duplex scan in 9 months and I will see him back at that time.  He will continue his therapy.  He is on aspirin and a statin.  I explained that from  my standpoint he does not need to remain on Plavix but I will leave that up to his primary care physician.  Waverly Ferrari [facility] [phone]

## 2019-10-01 ENCOUNTER — Ambulatory Visit: Payer: BC Managed Care – PPO | Admitting: Occupational Therapy

## 2019-10-01 ENCOUNTER — Encounter: Payer: Self-pay | Admitting: Occupational Therapy

## 2019-10-01 ENCOUNTER — Ambulatory Visit: Payer: BC Managed Care – PPO

## 2019-10-01 DIAGNOSIS — M6281 Muscle weakness (generalized): Secondary | ICD-10-CM

## 2019-10-01 DIAGNOSIS — I69351 Hemiplegia and hemiparesis following cerebral infarction affecting right dominant side: Secondary | ICD-10-CM

## 2019-10-01 DIAGNOSIS — R41841 Cognitive communication deficit: Secondary | ICD-10-CM

## 2019-10-01 DIAGNOSIS — M25641 Stiffness of right hand, not elsewhere classified: Secondary | ICD-10-CM

## 2019-10-01 DIAGNOSIS — M25631 Stiffness of right wrist, not elsewhere classified: Secondary | ICD-10-CM

## 2019-10-01 DIAGNOSIS — R4701 Aphasia: Secondary | ICD-10-CM | POA: Diagnosis not present

## 2019-10-01 DIAGNOSIS — R482 Apraxia: Secondary | ICD-10-CM

## 2019-10-01 NOTE — Therapy (Signed)
[facility] [address] [place], [place], [address] Phone: [phone]   Fax:  [phone]  Occupational Therapy Evaluation  Patient Details  Name: Corey Bennett MRN: [id number] Date of Birth: 03-Nov-1960 Referring Provider (OT): Burton Apley   Encounter Date: 10/01/2019   OT End of Session - 10/01/19 1833    Visit Number 1    Number of Visits 17    Date for OT Re-Evaluation 12/30/19    Authorization Type 30 VL Combined OT/PT/SLP    OT Start Time 1400    OT Stop Time 1445    OT Time Calculation (min) 45 min    Activity Tolerance Patient tolerated treatment well    Behavior During Therapy [facility] for tasks assessed/performed           Past Medical History:  Diagnosis Date  . Carotid artery occlusion   . Diabetes (HCC)   . Hypercholesterolemia   . Hypertension   . 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 Stroke [facility])     Past Surgical History:  Procedure Laterality Date  . ENDARTERECTOMY Left 09/08/2019   Procedure: LEFT CAROTID ENDARTERECTOMY;  Surgeon: Chuck Hint, MD;  Location: [facility] OR;  Service: Vascular;  Laterality: Left;  . PATCH ANGIOPLASTY Left 09/08/2019   Procedure: PATCH ANGIOPLASTY USING XENOSURE BIOLOGIC PATCH 1x6cm;  Surgeon: Chuck Hint, MD;  Location: [facility] OR;  Service: Vascular;  Laterality: Left;  . TONSILLECTOMY      There were no vitals filed for this visit.   Subjective Assessment - 10/01/19 1409    Patient is accompanied by: Family member   Sister in law Rebeccah   Currently in Pain? 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    Leisure reading, watching tv      ADL   Eating/Feeding Modified independent    Grooming Modified independent    Upper Body Bathing Minimal assistance    Lower Body Bathing Minimal assistance    Upper Body Dressing Minimal assistance    Lower Body Dressing Minimal assistance    Toilet Transfer Modified independent    Toileting - Clothing Manipulation Modified independent    Toileting -  Hygiene Modified Independent    Tub/Shower Transfer Minimal assistance    ADL comments Lives in ALF, has assistance for bathing, dressing, meals      Written Expression   Dominant Hand Right      Vision - History   Additional Comments Cataracts      Vision Assessment   Eye Alignment Within Functional Limits    Ocular Range of Motion Within Functional Limits    Alignment/Gaze Preference Within Defined Limits    Tracking/Visual Pursuits Able to track stimulus in all quads without difficulty      Cognition   Overall Cognitive Status Cognition to be further assessed in functional context PRN    Cognition Comments Sustained eye contact, facial expressions match  response      Posture/Postural Control   Posture/Postural Control No significant limitations      Sensation   Light Touch Appears Intact      Coordination   Box and Blocks Unable      Perception   Perception Within Functional Limits      Praxis   Praxis Intact      Tone   Assessment Location Right Upper Extremity      ROM / Strength   AROM / PROM / Strength PROM      PROM   Overall PROM  Deficits    Overall PROM Comments 140 shoulder flex, decreased scap mob      Strength   Overall Strength Deficits    Overall Strength Comments dense hemiplegia RUE      RUE Tone   RUE Tone Mild;Hypertonic;Hypotonic      RUE Tone   Hypertonic Details wrist, and finger flexors    Hypotonic Details shoulder                           OT Education - 10/01/19  1832    Education Details Results of OT eval and potential goals    Person(s) Educated Patient;Other (comment)   sister in law   Methods Explanation    Comprehension Other (comment)            OT Short Term Goals - 10/01/19 1841      OT SHORT TERM GOAL #1   Title Patient will complete a home exercise program with min cueing due 9/12    Time 4    Period Weeks    Status New    Target Date 11/15/19      OT SHORT TERM GOAL #2   Title Patient will don and doff right hand/wrist orthosis with min cueing    Time 4    Period Weeks    Status New      OT SHORT TERM GOAL #3   Title Patient will prepare simple snack prep (using microwave vs stovetop) with min cueing    Time 4    Period Weeks    Status New      OT SHORT TERM GOAL #4   Title Patient will demonstrate passive range of motion in right shoulder to 160 degrees of flexion and/or abduction without pain in prep for more active reach patterns    Time 4    Period Weeks    Status New             OT Long Term Goals - 10/01/19 1844      OT LONG TERM GOAL #1   Title Patient will complete an updated HEP independently 12/30/19    Time 8    Period Weeks    Status New    Target Date 12/30/19      OT LONG TERM GOAL #2   Title Patient will release a cylindrical object placed in right hand with min cueing  in preparation for assist with ADL    Time 8    Period Weeks    Status New      OT LONG TERM GOAL #3   Title Patient will flex fingers toward cylindrical object placed near hand in preparation for grasp/ assistance with ADL    Time 8    Period Weeks    Status New      OT LONG TERM GOAL #4   Title Patient will  demonstrate at least 45 degrees of active shoulder flexion (may be reaching downward) to assist with donning right arm into sleeve of shirt    Time 8    Period Weeks    Status New      OT LONG TERM GOAL #5   Title Patient will grasp and release a 1'cube or smaller using right hand    Time 8    Period Weeks     Status New                 Plan - 10/01/19 1834    Clinical Impression Statement Patient is a 59 yr old man who suffered a L MCA CVA in [place] in April.  He completed an inpatient rehab course and moved closer to his family in [place].  He is currently residing in an Assisted Living Facility with hopes of reducing level of care needed, and increasing level of independence.  Patient presents to OT eval with global aphasia expressive>receptive, right hemiplegia in UE >LE.  Patient has limited functional use of his RUE and will benefit from skilled OT intervention to prevent pain and further malalignment and to promote functional grasp/release and reach.    OT Occupational Profile and History Problem Focused Assessment - Including review of records relating to presenting problem    Occupational performance deficits (Please refer to evaluation for details): ADL's;IADL's;Work    Body Structure / Function / Physical Skills ADL;Coordination;Endurance;GMC;UE functional use;Balance;Body mechanics;Decreased knowledge of use of DME;Flexibility;IADL;Pain;Strength;FMC;Dexterity;Edema;Mobility;ROM;Tone    Rehab Potential Good    Clinical Decision Making Several treatment options, min-mod task modification necessary    Comorbidities Affecting Occupational Performance: May have comorbidities impacting occupational performance    Modification or Assistance to Complete Evaluation  Min-Moderate modification of tasks or assist with assess necessary to complete eval    OT Frequency 2x / week    OT Duration 8 weeks    OT Treatment/Interventions Self-care/ADL training;Electrical Stimulation;Therapeutic exercise;Patient/family education;Splinting;Neuromuscular education;Moist Heat;Fluidtherapy;Building services engineer;Therapeutic activities;Balance training;Cognitive remediation/compensation;DME and/or AE instruction    Plan Begin shoulder HEP - Consider supine, may benefit from  resting hand splint     Consulted and Agree with Plan of Care Patient;Family member/caregiver    Family Member Consulted Sister in lawa Rebeccah           Patient will benefit from skilled therapeutic intervention in order to improve the following deficits and impairments:   Body Structure / Function / Physical Skills: ADL, Coordination, Endurance, GMC, UE functional use, Balance, Body mechanics, Decreased knowledge of use of DME, Flexibility, IADL, Pain, Strength, FMC, Dexterity, Edema, Mobility, ROM, Tone       Visit Diagnosis: Hemiplegia and hemiparesis following cerebral infarction affecting right dominant side (HCC) - Plan: Ot plan of care cert/re-cert  Muscle weakness (generalized) - Plan: Ot plan of care cert/re-cert  Stiffness of right wrist, not elsewhere classified - Plan: Ot plan of care cert/re-cert  Stiffness of right hand, not elsewhere classified - Plan: Ot plan of care cert/re-cert    Problem List Patient Active Problem List   Diagnosis Date Noted  . Carotid artery stenosis 09/08/2019    Collier Salina, OTR/L 10/01/2019, 6:50 PM  [facility] [address] [place], [place], [address] Phone: [phone]   Fax:  [phone]  Name: Corey Bennett MRN: [id number] Date of Birth: 1960/03/07

## 2019-10-01 NOTE — Therapy (Signed)
[facility] [address] [place], [place], [address] Phone: [phone]   Fax:  [phone]  Patient Details  Name: Kareen Hitsman MRN: [id number] Date of Birth: [birth year] Referring Provider:  Burton Apley, MD  Encounter Date: 10/01/2019    ST Arrived/Cancel  SLP explained to pt/sister in law that ST will be put on hold until after pt has received his speech generating device. Pt/ sister in law voiced understanding. SLP cancelled pt's ST for 10-06-19.    [facility] ,MS, CCC-SLP  10/01/2019, 1:49 PM  [facility] [address] [place], [place], [address] Phone: [phone]   Fax:  [phone]

## 2019-10-02 ENCOUNTER — Other Ambulatory Visit: Payer: Self-pay | Admitting: *Deleted

## 2019-10-02 DIAGNOSIS — R0989 Other specified symptoms and signs involving the circulatory and respiratory systems: Secondary | ICD-10-CM

## 2019-10-06 ENCOUNTER — Ambulatory Visit: Payer: BC Managed Care – PPO | Admitting: Speech Pathology

## 2019-10-08 ENCOUNTER — Ambulatory Visit: Payer: BC Managed Care – PPO | Admitting: Speech Pathology

## 2019-10-10 ENCOUNTER — Encounter (HOSPITAL_COMMUNITY): Payer: Self-pay | Admitting: Emergency Medicine

## 2019-10-10 ENCOUNTER — Inpatient Hospital Stay (HOSPITAL_COMMUNITY)
Admission: EM | Admit: 2019-10-10 | Discharge: 2019-11-21 | DRG: 004 | Disposition: A | Discharge status: Rehabilitation Facility | Payer: BC Managed Care – PPO | Source: Skilled Nursing Facility | Attending: Internal Medicine | Admitting: Internal Medicine

## 2019-10-10 ENCOUNTER — Emergency Department (HOSPITAL_COMMUNITY): Payer: BC Managed Care – PPO

## 2019-10-10 DIAGNOSIS — R0602 Shortness of breath: Secondary | ICD-10-CM

## 2019-10-10 DIAGNOSIS — R197 Diarrhea, unspecified: Secondary | ICD-10-CM | POA: Diagnosis not present

## 2019-10-10 DIAGNOSIS — M7989 Other specified soft tissue disorders: Secondary | ICD-10-CM | POA: Diagnosis not present

## 2019-10-10 DIAGNOSIS — I69320 Aphasia following cerebral infarction: Secondary | ICD-10-CM

## 2019-10-10 DIAGNOSIS — G40101 Localization-related (focal) (partial) symptomatic epilepsy and epileptic syndromes with simple partial seizures, not intractable, with status epilepticus: Secondary | ICD-10-CM | POA: Diagnosis not present

## 2019-10-10 DIAGNOSIS — J9621 Acute and chronic respiratory failure with hypoxia: Secondary | ICD-10-CM | POA: Diagnosis present

## 2019-10-10 DIAGNOSIS — Z20822 Contact with and (suspected) exposure to covid-19: Secondary | ICD-10-CM | POA: Diagnosis present

## 2019-10-10 DIAGNOSIS — E7800 Pure hypercholesterolemia, unspecified: Secondary | ICD-10-CM | POA: Diagnosis present

## 2019-10-10 DIAGNOSIS — R1312 Dysphagia, oropharyngeal phase: Secondary | ICD-10-CM | POA: Diagnosis present

## 2019-10-10 DIAGNOSIS — Z4659 Encounter for fitting and adjustment of other gastrointestinal appliance and device: Secondary | ICD-10-CM

## 2019-10-10 DIAGNOSIS — K029 Dental caries, unspecified: Secondary | ICD-10-CM | POA: Diagnosis present

## 2019-10-10 DIAGNOSIS — R4182 Altered mental status, unspecified: Secondary | ICD-10-CM

## 2019-10-10 DIAGNOSIS — J690 Pneumonitis due to inhalation of food and vomit: Secondary | ICD-10-CM | POA: Diagnosis not present

## 2019-10-10 DIAGNOSIS — E785 Hyperlipidemia, unspecified: Secondary | ICD-10-CM | POA: Diagnosis present

## 2019-10-10 DIAGNOSIS — K9421 Gastrostomy hemorrhage: Secondary | ICD-10-CM | POA: Diagnosis not present

## 2019-10-10 DIAGNOSIS — Z978 Presence of other specified devices: Secondary | ICD-10-CM

## 2019-10-10 DIAGNOSIS — G9340 Encephalopathy, unspecified: Secondary | ICD-10-CM | POA: Diagnosis present

## 2019-10-10 DIAGNOSIS — Z781 Physical restraint status: Secondary | ICD-10-CM

## 2019-10-10 DIAGNOSIS — R935 Abnormal findings on diagnostic imaging of other abdominal regions, including retroperitoneum: Secondary | ICD-10-CM

## 2019-10-10 DIAGNOSIS — Z0189 Encounter for other specified special examinations: Secondary | ICD-10-CM

## 2019-10-10 DIAGNOSIS — Z7982 Long term (current) use of aspirin: Secondary | ICD-10-CM

## 2019-10-10 DIAGNOSIS — G9389 Other specified disorders of brain: Secondary | ICD-10-CM | POA: Diagnosis present

## 2019-10-10 DIAGNOSIS — Z930 Tracheostomy status: Secondary | ICD-10-CM

## 2019-10-10 DIAGNOSIS — E1169 Type 2 diabetes mellitus with other specified complication: Secondary | ICD-10-CM

## 2019-10-10 DIAGNOSIS — F17210 Nicotine dependence, cigarettes, uncomplicated: Secondary | ICD-10-CM | POA: Diagnosis present

## 2019-10-10 DIAGNOSIS — I69351 Hemiplegia and hemiparesis following cerebral infarction affecting right dominant side: Secondary | ICD-10-CM

## 2019-10-10 DIAGNOSIS — E876 Hypokalemia: Secondary | ICD-10-CM | POA: Diagnosis not present

## 2019-10-10 DIAGNOSIS — R945 Abnormal results of liver function studies: Secondary | ICD-10-CM | POA: Diagnosis not present

## 2019-10-10 DIAGNOSIS — N390 Urinary tract infection, site not specified: Secondary | ICD-10-CM | POA: Diagnosis present

## 2019-10-10 DIAGNOSIS — D649 Anemia, unspecified: Secondary | ICD-10-CM | POA: Diagnosis present

## 2019-10-10 DIAGNOSIS — R112 Nausea with vomiting, unspecified: Secondary | ICD-10-CM | POA: Diagnosis not present

## 2019-10-10 DIAGNOSIS — I10 Essential (primary) hypertension: Secondary | ICD-10-CM | POA: Diagnosis present

## 2019-10-10 DIAGNOSIS — I82611 Acute embolism and thrombosis of superficial veins of right upper extremity: Secondary | ICD-10-CM | POA: Diagnosis not present

## 2019-10-10 DIAGNOSIS — Z79899 Other long term (current) drug therapy: Secondary | ICD-10-CM

## 2019-10-10 DIAGNOSIS — E1165 Type 2 diabetes mellitus with hyperglycemia: Secondary | ICD-10-CM | POA: Diagnosis present

## 2019-10-10 DIAGNOSIS — N179 Acute kidney failure, unspecified: Secondary | ICD-10-CM | POA: Diagnosis present

## 2019-10-10 DIAGNOSIS — Z794 Long term (current) use of insulin: Secondary | ICD-10-CM

## 2019-10-10 DIAGNOSIS — R1310 Dysphagia, unspecified: Secondary | ICD-10-CM

## 2019-10-10 DIAGNOSIS — E11649 Type 2 diabetes mellitus with hypoglycemia without coma: Secondary | ICD-10-CM | POA: Diagnosis not present

## 2019-10-10 DIAGNOSIS — G40901 Epilepsy, unspecified, not intractable, with status epilepticus: Secondary | ICD-10-CM | POA: Diagnosis not present

## 2019-10-10 DIAGNOSIS — Z431 Encounter for attention to gastrostomy: Secondary | ICD-10-CM

## 2019-10-10 DIAGNOSIS — B961 Klebsiella pneumoniae [K. pneumoniae] as the cause of diseases classified elsewhere: Secondary | ICD-10-CM | POA: Diagnosis present

## 2019-10-10 DIAGNOSIS — R0603 Acute respiratory distress: Secondary | ICD-10-CM

## 2019-10-10 DIAGNOSIS — R569 Unspecified convulsions: Secondary | ICD-10-CM | POA: Diagnosis not present

## 2019-10-10 DIAGNOSIS — R0902 Hypoxemia: Secondary | ICD-10-CM

## 2019-10-10 DIAGNOSIS — I619 Nontraumatic intracerebral hemorrhage, unspecified: Secondary | ICD-10-CM

## 2019-10-10 DIAGNOSIS — Z8619 Personal history of other infectious and parasitic diseases: Secondary | ICD-10-CM

## 2019-10-10 DIAGNOSIS — E878 Other disorders of electrolyte and fluid balance, not elsewhere classified: Secondary | ICD-10-CM | POA: Diagnosis present

## 2019-10-10 DIAGNOSIS — Z7902 Long term (current) use of antithrombotics/antiplatelets: Secondary | ICD-10-CM

## 2019-10-10 DIAGNOSIS — R7989 Other specified abnormal findings of blood chemistry: Secondary | ICD-10-CM

## 2019-10-10 DIAGNOSIS — R0600 Dyspnea, unspecified: Secondary | ICD-10-CM

## 2019-10-10 DIAGNOSIS — Z01818 Encounter for other preprocedural examination: Secondary | ICD-10-CM

## 2019-10-10 DIAGNOSIS — I6529 Occlusion and stenosis of unspecified carotid artery: Secondary | ICD-10-CM | POA: Diagnosis present

## 2019-10-10 LAB — PROTIME-INR
INR: 1.1 (ref 0.8–1.2)
Prothrombin Time: 13.5 seconds (ref 11.4–15.2)

## 2019-10-10 LAB — CBC
HCT: 34.5 % — ABNORMAL LOW (ref 39.0–52.0)
Hemoglobin: 11.7 g/dL — ABNORMAL LOW (ref 13.0–17.0)
MCH: 28.8 pg (ref 26.0–34.0)
MCHC: 33.9 g/dL (ref 30.0–36.0)
MCV: 85 fL (ref 80.0–100.0)
Platelets: 329 10*3/uL (ref 150–400)
RBC: 4.06 MIL/uL — ABNORMAL LOW (ref 4.22–5.81)
RDW: 13.1 % (ref 11.5–15.5)
WBC: 19.6 10*3/uL — ABNORMAL HIGH (ref 4.0–10.5)
nRBC: 0 % (ref 0.0–0.2)

## 2019-10-10 LAB — APTT: aPTT: 33 seconds (ref 24–36)

## 2019-10-10 LAB — I-STAT CHEM 8, ED
BUN: 30 mg/dL — ABNORMAL HIGH (ref 6–20)
Calcium, Ion: 1.24 mmol/L (ref 1.15–1.40)
Chloride: 99 mmol/L (ref 98–111)
Creatinine, Ser: 1.5 mg/dL — ABNORMAL HIGH (ref 0.61–1.24)
Glucose, Bld: 304 mg/dL — ABNORMAL HIGH (ref 70–99)
HCT: 37 % — ABNORMAL LOW (ref 39.0–52.0)
Hemoglobin: 12.6 g/dL — ABNORMAL LOW (ref 13.0–17.0)
Potassium: 3.6 mmol/L (ref 3.5–5.1)
Sodium: 138 mmol/L (ref 135–145)
TCO2: 23 mmol/L (ref 22–32)

## 2019-10-10 LAB — COMPREHENSIVE METABOLIC PANEL
ALT: 21 U/L (ref 0–44)
AST: 23 U/L (ref 15–41)
Albumin: 4.1 g/dL (ref 3.5–5.0)
Alkaline Phosphatase: 118 U/L (ref 38–126)
Anion gap: 17 — ABNORMAL HIGH (ref 5–15)
BUN: 30 mg/dL — ABNORMAL HIGH (ref 6–20)
CO2: 21 mmol/L — ABNORMAL LOW (ref 22–32)
Calcium: 9.5 mg/dL (ref 8.9–10.3)
Chloride: 97 mmol/L — ABNORMAL LOW (ref 98–111)
Creatinine, Ser: 1.67 mg/dL — ABNORMAL HIGH (ref 0.61–1.24)
GFR calc Af Amer: 51 mL/min — ABNORMAL LOW (ref >60)
GFR calc non Af Amer: 44 mL/min — ABNORMAL LOW (ref >60)
Glucose, Bld: 306 mg/dL — ABNORMAL HIGH (ref 70–99)
Potassium: 3.6 mmol/L (ref 3.5–5.1)
Sodium: 135 mmol/L (ref 135–145)
Total Bilirubin: 0.6 mg/dL (ref 0.3–1.2)
Total Protein: 8.2 g/dL — ABNORMAL HIGH (ref 6.5–8.1)

## 2019-10-10 LAB — DIFFERENTIAL
Abs Immature Granulocytes: 0.14 10*3/uL — ABNORMAL HIGH (ref 0.00–0.07)
Basophils Absolute: 0.1 10*3/uL (ref 0.0–0.1)
Basophils Relative: 0 %
Eosinophils Absolute: 0.1 10*3/uL (ref 0.0–0.5)
Eosinophils Relative: 0 %
Immature Granulocytes: 1 %
Lymphocytes Relative: 9 %
Lymphs Abs: 1.7 10*3/uL (ref 0.7–4.0)
Monocytes Absolute: 1 10*3/uL (ref 0.1–1.0)
Monocytes Relative: 5 %
Neutro Abs: 16.7 10*3/uL — ABNORMAL HIGH (ref 1.7–7.7)
Neutrophils Relative %: 85 %

## 2019-10-10 LAB — ETHANOL: Alcohol, Ethyl (B): <10 mg/dL (ref <10)

## 2019-10-10 IMAGING — DX DG CHEST 1V PORT
1 series · 1 of 1 positions shown · non-contrast
Comparison: None.

CLINICAL DATA: Post intubation

EXAM:
PORTABLE CHEST 1 VIEW

[chest]
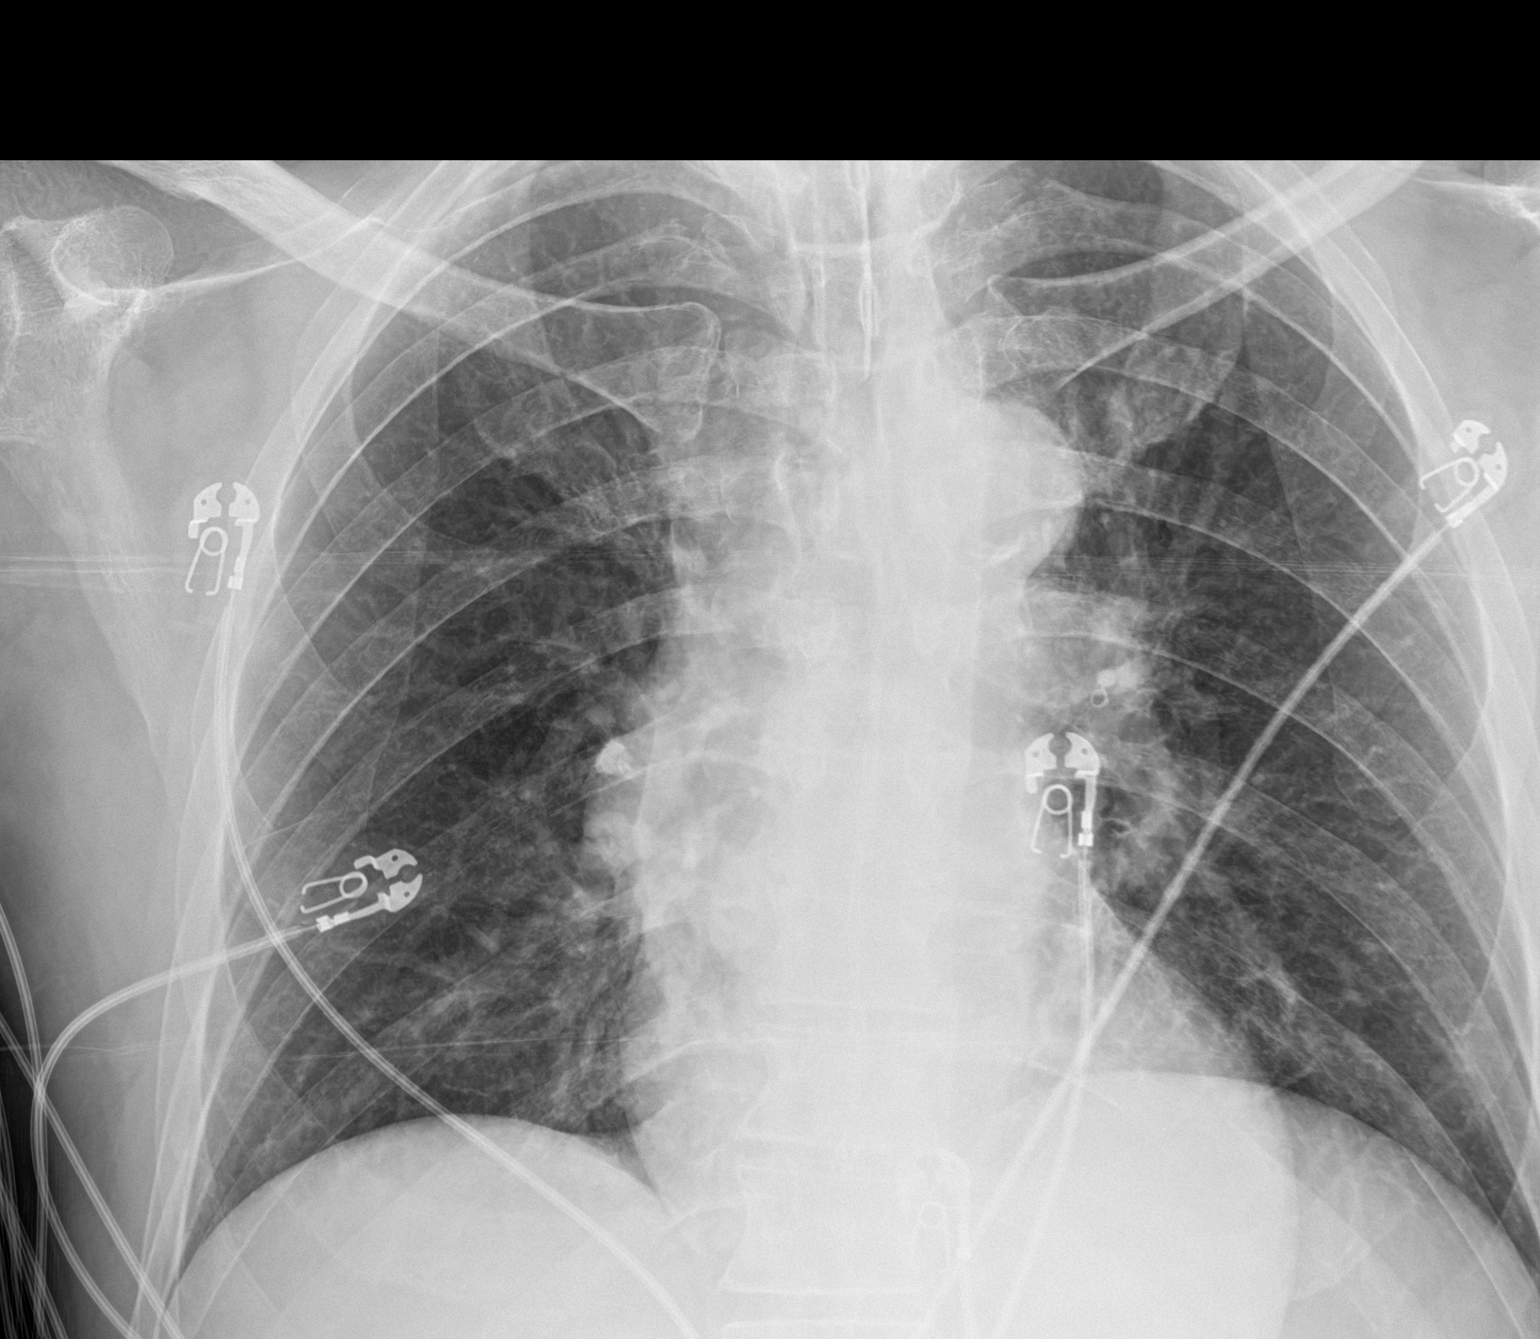

[1 of 1 positions shown; findings below may reference images not displayed]

FINDINGS: The heart size and mediastinal contours are within normal limits.
ETT is 4 cm above the level of the carina. Aortic knob
calcifications are seen. Both lungs are clear. The visualized
skeletal structures are unremarkable.
IMPRESSION: ETT 4 cm above the carina.

## 2019-10-10 IMAGING — CT CT HEAD CODE STROKE
3 series · 15 of 47 positions shown, 18 images · non-contrast
Comparison: None.

CLINICAL DATA: Code stroke.  Found unresponsive.

EXAM:
CT HEAD WITHOUT CONTRAST
TECHNIQUE: Contiguous axial images were obtained from the base of the skull
through the vertex without intravenous contrast.

[Series 3: head 5.0 st · axial · 0.50mm/px · z∈[-91,+64]mm · 9 of 37 slices shown, 12 images]
[im 3/37  brain]
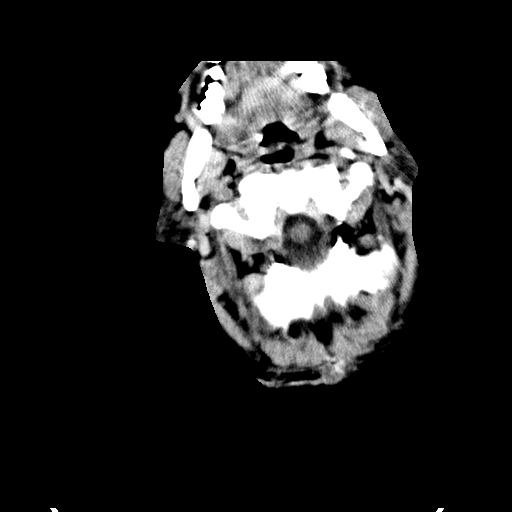
[im 3/37  bone]
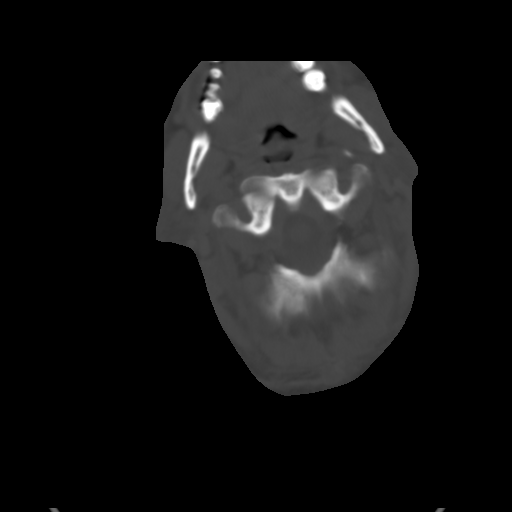
[im 7/37  brain]
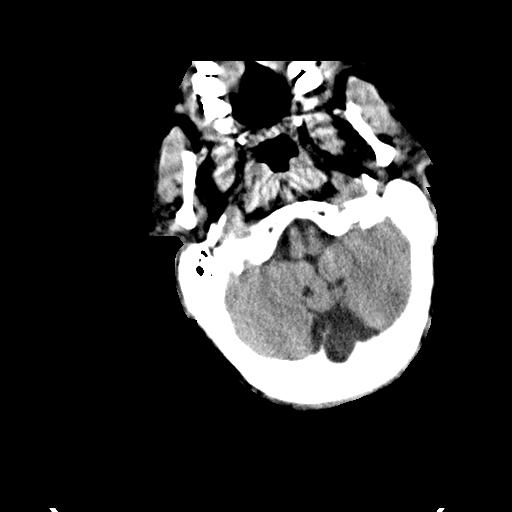
[im 10/37  brain]
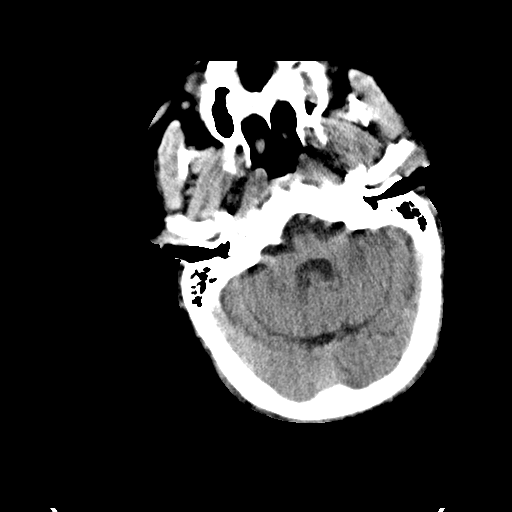
[im 14/37  brain]
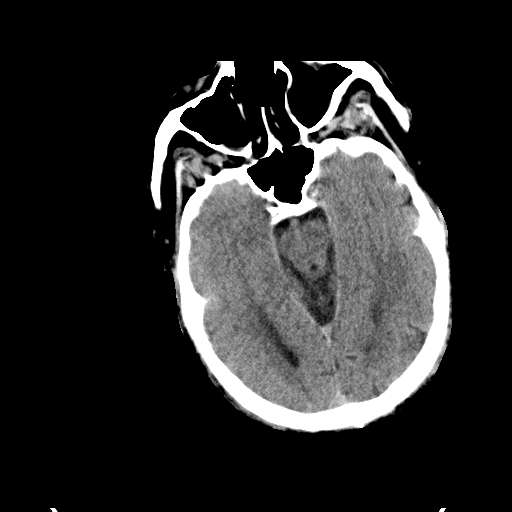
[im 19/37  brain]
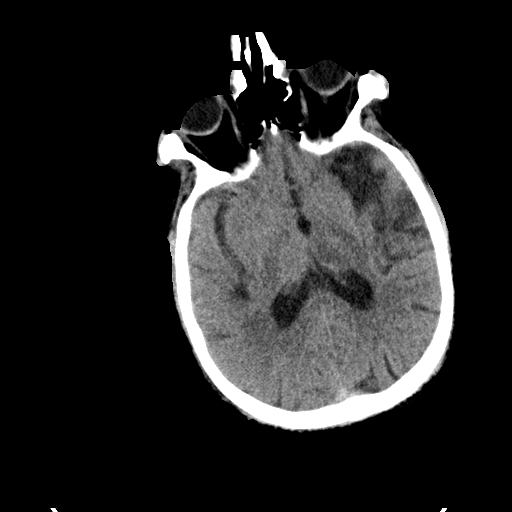
[im 19/37  bone]
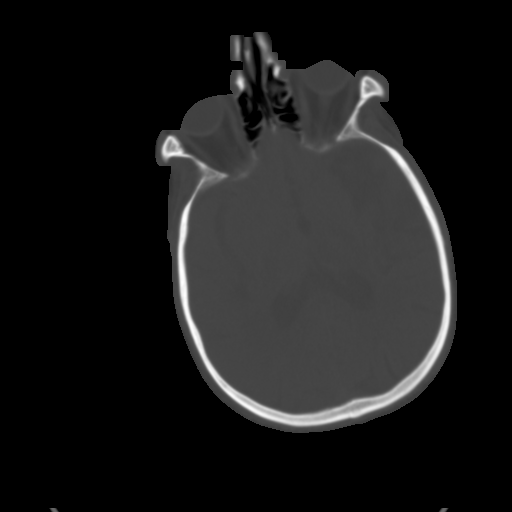
[im 23/37  brain]
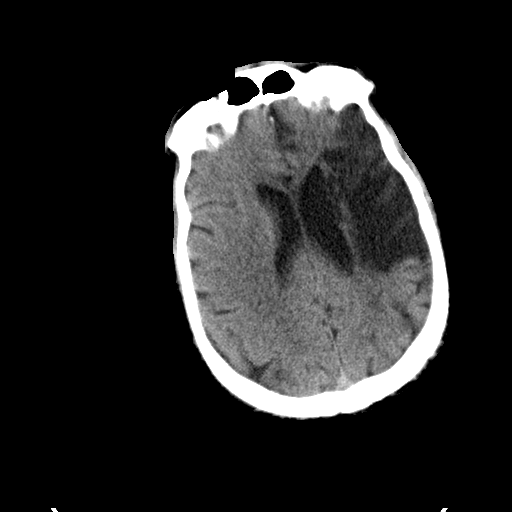
[im 27/37  brain]
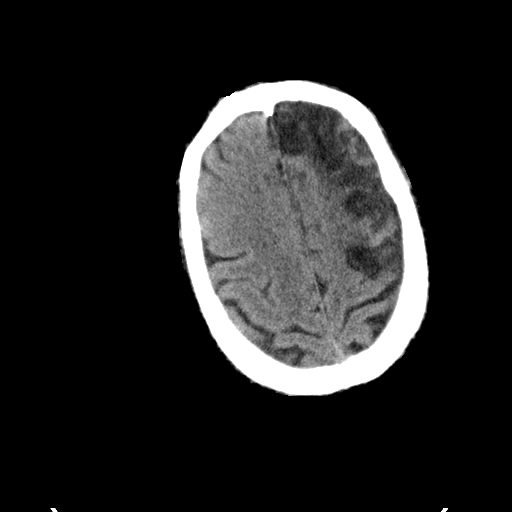
[im 30/37  brain]
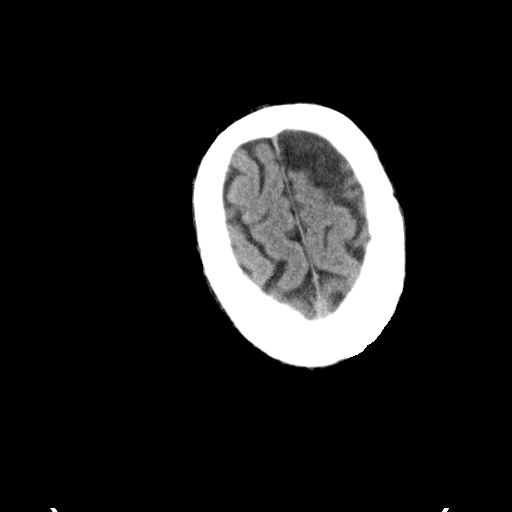
[im 34/37  brain]
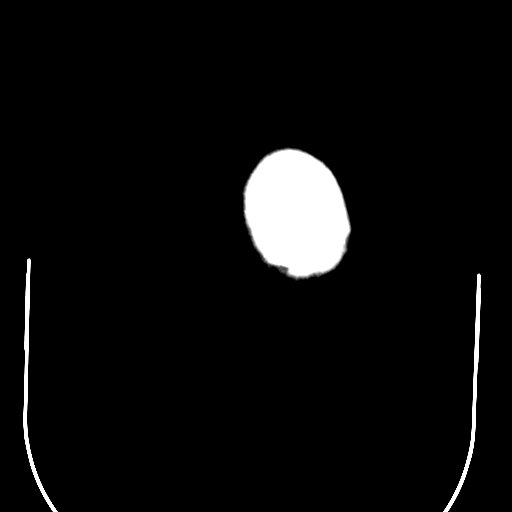
[im 34/37  bone]
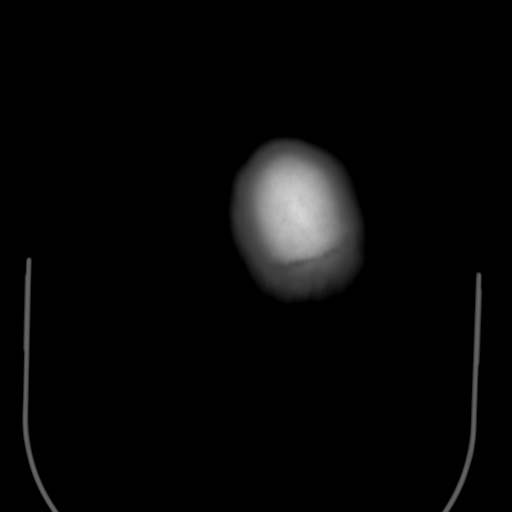

[Series 5: head 3.0 cor st · coronal · 0.36mm/px · 3 of 74 slices shown]
[im 25/74  brain]
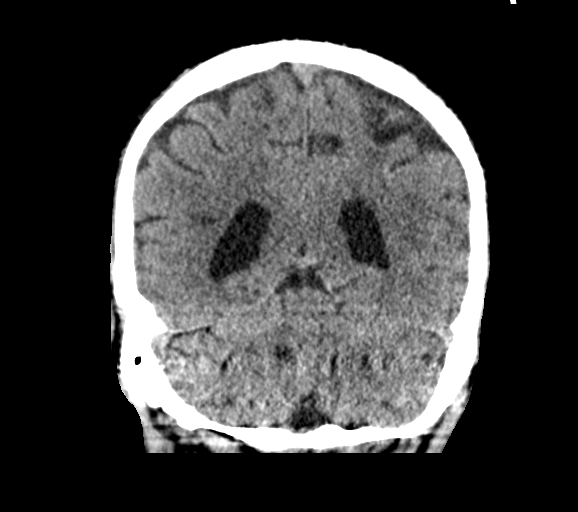
[im 33/74  brain]
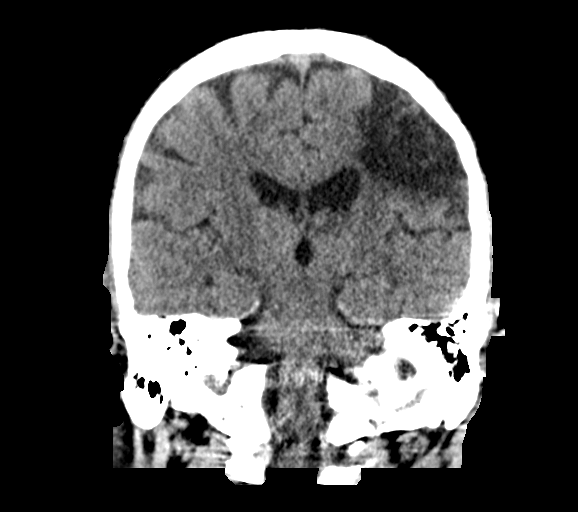
[im 41/74  brain]
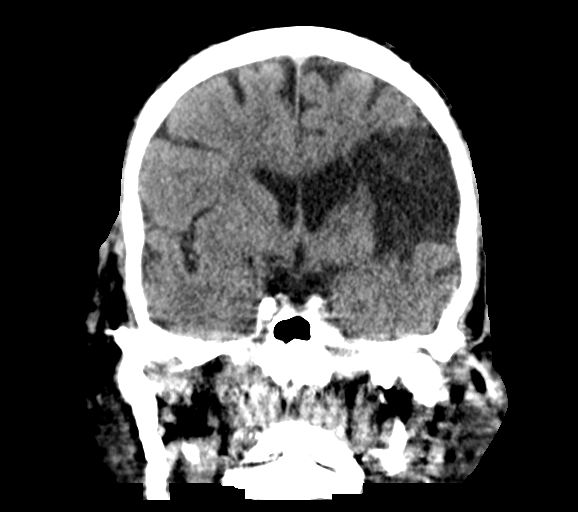

[Series 6: head 3.0 sag st · sagittal · 0.36mm/px · 3 of 65 slices shown]
[im 22/65  brain]
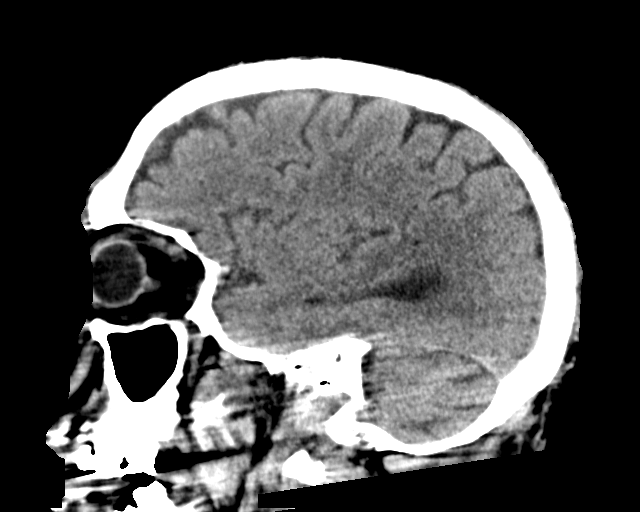
[im 33/65  brain]
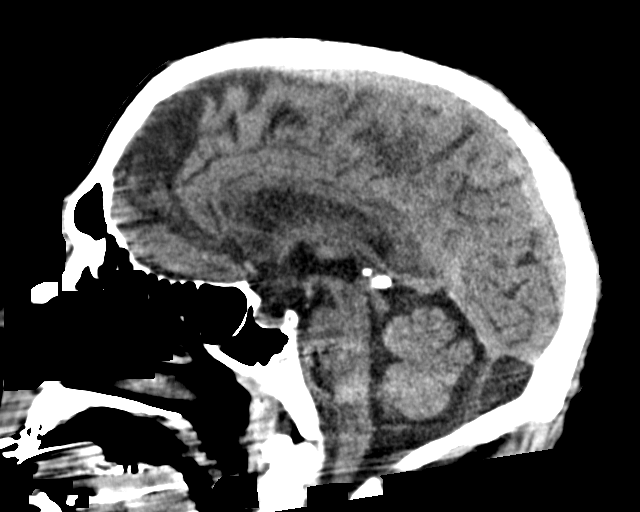
[im 43/65  brain]
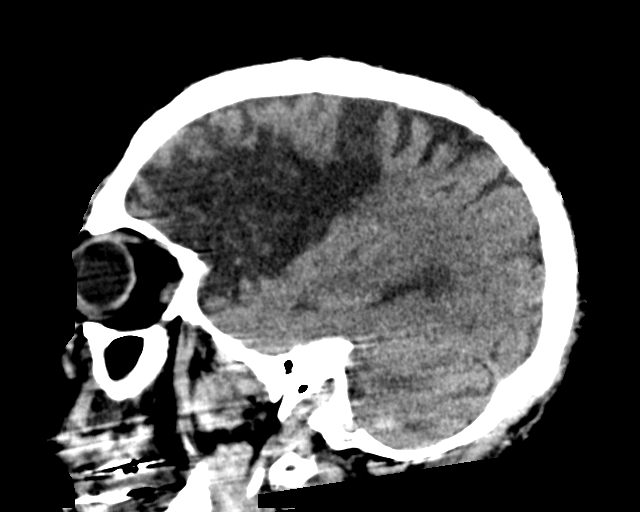

[15 of 47 positions shown; findings below may reference images not displayed]

FINDINGS: Brain: There is no mass, hemorrhage or extra-axial collection. No
evidence of acute cortical infarct. Large area of encephalomalacia
throughout the left MCA territory.

Vascular: No abnormal hyperdensity of the major intracranial
arteries or dural venous sinuses. No intracranial atherosclerosis.

Skull: The visualized skull base, calvarium and extracranial soft
tissues are normal.

Sinuses/Orbits: No fluid levels or advanced mucosal thickening of
the visualized paranasal sinuses. No mastoid or middle ear effusion.
The orbits are normal.

ASPECTS ([place]ta Stroke Program Early CT Score)

- Ganglionic level infarction (caudate, lentiform nuclei, internal
capsule, insula, M1-M3 cortex): 7

- Supraganglionic infarction (M4-M6 cortex): 3

Total score (0-10 with 10 being normal): 10
IMPRESSION: 1. No acute intracranial abnormality.
2. Large area of encephalomalacia throughout the left MCA territory,
consistent with remote infarct.
3. ASPECTS is 10.

These results were communicated to Dr. JIM at [DATE] on
[DATE] by text page via the [employer] messaging system.

## 2019-10-10 MED ORDER — MIDAZOLAM HCL 2 MG/2ML IJ SOLN
INTRAMUSCULAR | Status: AC
  Filled 2019-10-10: qty 2

## 2019-10-10 MED ORDER — LORAZEPAM 2 MG/ML IJ SOLN
INTRAMUSCULAR | Status: AC
  Filled 2019-10-10: qty 1

## 2019-10-10 MED ORDER — SODIUM CHLORIDE 0.9 % IV SOLN
2000.0000 mg | INTRAVENOUS | Status: AC
  Administered 2019-10-10: 2000 mg via INTRAVENOUS
  Filled 2019-10-10: qty 20

## 2019-10-10 MED ORDER — LEVETIRACETAM IN NACL 1500 MG/100ML IV SOLN: 1500.0000 mg | Freq: Once | INTRAVENOUS | Status: DC

## 2019-10-10 MED ORDER — ROCURONIUM BROMIDE 50 MG/5ML IV SOLN
INTRAVENOUS | Status: AC | PRN
  Administered 2019-10-10: 70 mg via INTRAVENOUS

## 2019-10-10 MED ORDER — SODIUM CHLORIDE 0.9 % IV SOLN
1500.0000 mg | Freq: Once | INTRAVENOUS | Status: AC
  Administered 2019-10-11: 1500 mg via INTRAVENOUS
  Filled 2019-10-10: qty 30

## 2019-10-10 MED ORDER — LEVETIRACETAM IN NACL 1000 MG/100ML IV SOLN
1000.0000 mg | Freq: Once | INTRAVENOUS | Status: AC
  Administered 2019-10-10: 1000 mg via INTRAVENOUS

## 2019-10-10 MED ORDER — PROPOFOL 1000 MG/100ML IV EMUL
INTRAVENOUS | Status: AC
  Administered 2019-10-10: 20 ug/kg/min
  Filled 2019-10-10: qty 100

## 2019-10-10 MED ORDER — SODIUM CHLORIDE 0.9 % IV SOLN
3000.0000 mg | Freq: Once | INTRAVENOUS | Status: DC
  Filled 2019-10-10: qty 30

## 2019-10-10 MED ORDER — ETOMIDATE 2 MG/ML IV SOLN
INTRAVENOUS | Status: AC | PRN
  Administered 2019-10-10: 20 mg via INTRAVENOUS

## 2019-10-10 MED ORDER — PROPOFOL 1000 MG/100ML IV EMUL
5.0000 ug/kg/min | INTRAVENOUS | Status: DC
  Administered 2019-10-11: 25 ug/kg/min via INTRAVENOUS
  Administered 2019-10-11: 40 ug/kg/min via INTRAVENOUS
  Administered 2019-10-11: 50 ug/kg/min via INTRAVENOUS
  Administered 2019-10-12: 25 ug/kg/min via INTRAVENOUS
  Filled 2019-10-10 (×7): qty 100

## 2019-10-10 NOTE — ED Triage Notes (Signed)
Pt to ED via [facility] from [facility] assisted living.  Staff called EMS after finding pt on floor unresponsive.  Pt is there due to a recent CVA.  Pt normally walks at night around at night last seen 10 mins. Prior to being found on floor.  EMS gave pt Versed 5mg  due to twitching on right side

## 2019-10-10 NOTE — ED Notes (Signed)
Keppra 1 gram infusing at this time

## 2019-10-10 NOTE — ED Notes (Signed)
Preparing pt for intubation by Dr. Michaell Cowing

## 2019-10-10 NOTE — ED Notes (Signed)
Return from CT   Pt continues to twitch on right side

## 2019-10-10 NOTE — Code Documentation (Signed)
Responded to Code Stroke called on pt already in ED for R-sided gaze/unresponsiveness, LSN 2115. Pt with R-sided gaze and twitching of R arm/face. Pt given 2mg  ativan IV x 2 and 1g keppra IV with no change in twitching. Pt was transported to CT which showed nothing acute. Pt transported back to trauma C and given additional 2g keppra for total of 3g. Decision was made to intubate for airway protection. Pt was given 20mg  etomidate and 70mg  rocuronium and intubated at 2324. Plan to admit to ICU.

## 2019-10-10 NOTE — ED Notes (Signed)
Pt to CT  Pt continues to twitch on right side

## 2019-10-10 NOTE — ED Provider Notes (Signed)
Procedure Name: Intubation Date/Time: 10/10/2019 11:36 PM Performed by: Nino Parsley, MD Pre-anesthesia Checklist: Emergency Drugs available, Timeout performed, Patient being monitored, Patient identified and Suction available Oxygen Delivery Method: Ambu bag Preoxygenation: Pre-oxygenation with 100% oxygen Induction Type: Rapid sequence Ventilation: Mask ventilation without difficulty Laryngoscope Size: 4 and Glidescope Tube size: 7.5 mm Number of attempts: 1 Airway Equipment and Method: Video-laryngoscopy and Rigid stylet Placement Confirmation: ETT inserted through vocal cords under direct vision,  Positive ETCO2 and Breath sounds checked- equal and bilateral Secured at: 25 cm Tube secured with: Tape      Patient presented with status epilepticus.  We were asked to intubate the patient for airway management in the setting of continued status epilepticus after poor medication response by neurology attending.  Patient tolerated the procedure well as noted below without difficulty.  CXR confirmed tube placement.   Campbell Riches, PGY-3 Emergency Medicine    Nino Parsley, MD 10/10/19 8101    Blane Ohara, MD 10/10/19 [phone]

## 2019-10-10 NOTE — ED Provider Notes (Signed)
[facility] EMERGENCY DEPARTMENT Provider Note   CSN: [id number] Arrival date & time: 10/10/19  2223     History Chief Complaint  Patient presents with  . Code Stroke    Hallis Meditz is a 59 y.o. male presenting from [facility] after being found down in the hallway.  Per signout, patient typically walks around the hallways at night.  Nursing staff noticed that he had not made his way around and went looking for him.  Patient was down with twitching on the right side.  Patient was at [facility] due to recent CVA.    Past Medical History:  Diagnosis Date  . Carotid artery occlusion   . Diabetes (HCC)   . Hypercholesterolemia   . Hypertension   . 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 Stroke [facility])     Patient Active Problem List   Diagnosis Date Noted  . Carotid artery stenosis 09/08/2019    Past Surgical History:  Procedure Laterality Date  . ENDARTERECTOMY Left 09/08/2019   Procedure: LEFT CAROTID ENDARTERECTOMY;  Surgeon: Chuck Hint, MD;  Location: [facility] OR;  Service: Vascular;  Laterality: Left;  . PATCH ANGIOPLASTY Left 09/08/2019   Procedure: PATCH ANGIOPLASTY USING XENOSURE BIOLOGIC PATCH 1x6cm;  Surgeon: Chuck Hint, MD;  Location: [facility] OR;  Service: Vascular;  Laterality: Left;  . TONSILLECTOMY         No family history on file.  Social History   Tobacco Use  . Smoking status: Current Every Day Smoker    Packs/day: 1.00    Types: Cigarettes  . Smokeless tobacco: Never Used  Vaping Use  . Vaping Use: Never used  Substance Use Topics  . Alcohol use: Yes    Comment: occasional  . Drug use: Never    Home Medications Prior to Admission medications   Medication Sig Start Date End Date Taking? Authorizing Provider  amLODipine (NORVASC) 10 MG tablet Take 10 mg by mouth daily. (0600)    [provider]  aspirin EC 81 MG tablet Take 81 mg by mouth daily. (0600)    [provider]   atorvastatin (LIPITOR) 80 MG tablet Take 80 mg by mouth daily. (0600)    [provider]  Cholecalciferol (VITAMIN D-3) 125 MCG (5000 UT) TABS Take 5,000 Units by mouth daily. (0600)    [provider]  clopidogrel (PLAVIX) 75 MG tablet Take 1 tablet (75 mg total) by mouth daily. 09/09/19   Emilie Rutter, PA-C  Insulin Glargine (BASAGLAR KWIKPEN) 100 UNIT/ML Inject 12 Units into the skin at bedtime. (2000)    [provider]  insulin lispro (HUMALOG) 100 UNIT/ML injection Inject 5 Units into the skin 3 (three) times daily before meals. *HOLD INSULIN IF BLOOD GLUCOSE IS LESS THAN 120.*    [provider]  losartan (COZAAR) 25 MG tablet Take 25 mg by mouth daily. (0600)    [provider]  metFORMIN (GLUCOPHAGE) 1000 MG tablet Take 1,000 mg by mouth 2 (two) times daily with a meal. (0800 & 1700)    [provider]    Allergies    Patient has no known allergies.  Review of Systems   Review of Systems  Unable to perform ROS: Patient unresponsive    Physical Exam Updated Vital Signs BP (!) 217/97 (BP Location: Left Arm)   Pulse (!) 116   Resp 17   Ht 6\' 1"  (1.854 m)   Wt 68.7 kg  SpO2 100%   BMI 19.98 kg/m   Physical Exam Vitals reviewed.  Constitutional:      Comments: Unresponsive  HENT:     Head: Normocephalic and atraumatic.     Nose: Nose normal.     Mouth/Throat:     Mouth: Mucous membranes are moist.     Comments: Bleeding from anterior gums. Suction to oropharynx with partially coagulated blood   Eyes:     Pupils: Pupils are equal, round, and reactive to light.  Cardiovascular:     Rate and Rhythm: Regular rhythm. Tachycardia present.     Comments: Heart sounds distant  Abdominal:     General: Abdomen is flat. Bowel sounds are normal. There is no distension.     Palpations: Abdomen is soft.  Musculoskeletal:        General: No swelling.     Cervical back: Neck supple.     Right lower leg: No edema.      Left lower leg: No edema.  Skin:    General: Skin is warm and dry.     Capillary Refill: Capillary refill takes less than 2 seconds.  Neurological:     Mental Status: He is unresponsive.     Cranial Nerves: No facial asymmetry.     Deep Tendon Reflexes:     Reflex Scores:      Tricep reflexes are 3+ on the right side and 1+ on the left side.      Brachioradialis reflexes are 1+ on the right side and 1+ on the left side.    Comments: 0/4 extremities withdraw to pain. Right sided gaze. No corneal reflex.        ED Results / Procedures / Treatments   Labs (all labs ordered are listed, but only abnormal results are displayed) Labs Reviewed  CBC - Abnormal; Notable for the following components:      Result Value   WBC 19.6 (*)    RBC 4.06 (*)    Hemoglobin 11.7 (*)    HCT 34.5 (*)    All other components within normal limits  DIFFERENTIAL - Abnormal; Notable for the following components:   Neutro Abs 16.7 (*)    Abs Immature Granulocytes 0.14 (*)    All other components within normal limits  COMPREHENSIVE METABOLIC PANEL - Abnormal; Notable for the following components:   Chloride 97 (*)    CO2 21 (*)    Glucose, Bld 306 (*)    BUN 30 (*)    Creatinine, Ser 1.67 (*)    Total Protein 8.2 (*)    GFR calc non Af Amer 44 (*)    GFR calc Af Amer 51 (*)    Anion gap 17 (*)    All other components within normal limits  I-STAT CHEM 8, ED - Abnormal; Notable for the following components:   BUN 30 (*)    Creatinine, Ser 1.50 (*)    Glucose, Bld 304 (*)    Hemoglobin 12.6 (*)    HCT 37.0 (*)    All other components within normal limits  SARS CORONAVIRUS 2 BY RT PCR (HOSPITAL ORDER, PERFORMED IN [place] [facility])  ETHANOL  PROTIME-INR  APTT  RAPID URINE DRUG SCREEN, HOSP PERFORMED  URINALYSIS, ROUTINE W REFLEX MICROSCOPIC  TRIGLYCERIDES    EKG None  Radiology CT HEAD CODE STROKE WO CONTRAST  Result Date: 10/10/2019 CLINICAL DATA:  Code stroke.  Found  unresponsive. EXAM: CT HEAD WITHOUT CONTRAST TECHNIQUE: Contiguous axial images were obtained from  the base of the skull through the vertex without intravenous contrast. COMPARISON:  None. FINDINGS: Brain: There is no mass, hemorrhage or extra-axial collection. No evidence of acute cortical infarct. Large area of encephalomalacia throughout the left MCA territory. Vascular: No abnormal hyperdensity of the major intracranial arteries or dural venous sinuses. No intracranial atherosclerosis. Skull: The visualized skull base, calvarium and extracranial soft tissues are normal. Sinuses/Orbits: No fluid levels or advanced mucosal thickening of the visualized paranasal sinuses. No mastoid or middle ear effusion. The orbits are normal. ASPECTS [facility] Stroke Program Early CT Score) - Ganglionic level infarction (caudate, lentiform nuclei, internal capsule, insula, M1-M3 cortex): 7 - Supraganglionic infarction (M4-M6 cortex): 3 Total score (0-10 with 10 being normal): 10 IMPRESSION: 1. No acute intracranial abnormality. 2. Large area of encephalomalacia throughout the left MCA territory, consistent with remote infarct. 3. ASPECTS is 10. These results were communicated to Dr. Georgiana Spinner Aroor at 11:08 pm on 10/10/2019 by text page via the [facility] messaging system. Electronically Signed   By: Deatra Robinson M.D.   On: 10/10/2019 23:09    Procedures Procedures (including critical care time)  Medications Ordered in ED Medications  midazolam (VERSED) 2 MG/2ML injection (  Not Given 10/10/19 2330)  LORazepam (ATIVAN) 2 MG/ML injection (has no administration in time range)  LORazepam (ATIVAN) 2 MG/ML injection (has no administration in time range)  etomidate (AMIDATE) injection (20 mg Intravenous Given 10/10/19 2322)  rocuronium (ZEMURON) injection (70 mg Intravenous Given 10/10/19 2324)  propofol (DIPRIVAN) 1000 MG/100ML infusion (has no administration in time range)  phenytoin (DILANTIN) 1,500 mg in sodium chloride 0.9 % 250  mL IVPB (has no administration in time range)  levETIRAcetam (KEPPRA) IVPB 1000 mg/100 mL premix (0 mg Intravenous Stopped 10/10/19 2329)    Followed by  levETIRAcetam (KEPPRA) 2,000 mg in sodium chloride 0.9 % 250 mL IVPB (0 mg Intravenous Stopped 10/10/19 2329)  propofol (DIPRIVAN) 1000 MG/100ML infusion (20 mcg/kg/min  New Bag/Given 10/10/19 2328)    ED Course  I have reviewed the triage vital signs and the nursing notes.  Pertinent labs & imaging results that were available during my care of the patient were reviewed by me and considered in my medical decision making (see chart for details).    MDM Rules/Calculators/A&P                          59 year old presenting from assisted living after being found down and unresponsive at ALF (recent stroke rehab). Patient with right sided gaze deviation and right arm tremor on arrival. Physical exam as above. Presentation concerning for stroke vs. Seizure.  Code stroke called and neuro to bedside for evaluation. Provided 6 mg ativan which improved right sided gaze. Patient to CT showing large area of encephalomalacia throughout the left MCA territory, consistent with remote infarct.  Patient intubated and admission to neuro ICU.   Final Clinical Impression(s) / ED Diagnoses Final diagnoses:  Altered mental status, unspecified altered mental status type    Rx / DC Orders ED Discharge Orders    None       Melene Plan, MD 10/10/19 3151    Blane Ohara, MD 10/11/19 0002

## 2019-10-10 NOTE — Consult Note (Addendum)
Requesting Physician: Dr. Jodi Mourning    Chief Complaint: Unresponsive, right-sided twitching  History obtained from: Patient and Chart     HPI:                                                                                                                                       Corey Bennett is a 59 y.o. 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  Patient was evaluated by EDP and code stroke was initiated.  Last known normal unclear, signout received indicated patient typically walks around the hallways at night at ALF.  When nursing staff went to check up on him he was found on the floor twitching on the right side.  According to family patient has multiple abscesses in his teeth and recently been started on antibiotics  Course in ED:  On assessment, patient had right gaze deviation and continuous right-sided twitching.  Gait deviation improved after receiving 4 mg of Ativan.  Patient was loaded with 3 g of IV Keppra.  Stat CT head was obtained which showed a large left encephalomalacia.  Despite receiving Keppra patient continued to have left-sided twitching and patient became more lethargic.  Patient intubated for airway protection.     Past Medical History:  Diagnosis Date  . Carotid artery occlusion   . Diabetes (HCC)   . Hypercholesterolemia   . Hypertension   . 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 Stroke [facility])     Past Surgical History:  Procedure Laterality Date  . ENDARTERECTOMY Left 09/08/2019   Procedure: LEFT CAROTID ENDARTERECTOMY;  Surgeon: Chuck Hint, MD;  Location: [facility] OR;  Service: Vascular;  Laterality: Left;  . PATCH ANGIOPLASTY Left 09/08/2019   Procedure: PATCH ANGIOPLASTY USING XENOSURE BIOLOGIC PATCH 1x6cm;  Surgeon:  Chuck Hint, MD;  Location: [facility] OR;  Service: Vascular;  Laterality: Left;  . TONSILLECTOMY      No family history on file. Social History:  reports that he has been smoking cigarettes. He has been smoking about 1.00 pack per day. He has never used smokeless tobacco. He reports current alcohol use. He reports that he does not use drugs.  Allergies: No Known Allergies  Medications:  I reviewed home medications   ROS:                                                                                                                                     Unable to obtain due to patient's mental status   Examination:                                                                                                      General: Appears well-developed and well-nourished.  Psych: Unable to assess Eyes: No scleral injection HENT: No OP obstrucion Head: Normocephalic.  Cardiovascular: Normal rate and regular rhythm.  Respiratory: Effort normal and breath sounds normal to anterior ascultation GI: Soft.  No distension. There is no tenderness.  Skin: WDI    Neurological Examination Mental Status: Unresponsive, not following any commands.  Moans to noxious stimulus. Cranial Nerves: II: Visual fields: Unable to assess,  III,IV, VI: ptosis not present, forced right gaze deviation, pupils equal, round, reactive to light and accommodation VII: Right facial twitching IX/X: Gag reflex present XII: midline tongue extension Motor: Right upper extremity, rhythmic twitching.  Left upper extremity no spontaneously against gravity.  Bilateral lower extremities withdraws to noxious stimulus against gravity Tone and bulk:normal tone throughout; no atrophy noted Sensory: Withdraws to noxious stimulus bilaterally Plantars: Right: downgoing   Left: downgoing Cerebellar: Unable to  assess      Lab Results: Basic Metabolic Panel: Recent Labs  Lab 10/10/19 2248 10/10/19 2251  NA 135 138  K 3.6 3.6  CL 97* 99  CO2 21*  --   GLUCOSE 306* 304*  BUN 30* 30*  CREATININE 1.67* 1.50*  CALCIUM 9.5  --     CBC: Recent Labs  Lab 10/10/19 2248 10/10/19 2251  WBC 19.6*  --   NEUTROABS 16.7*  --   HGB 11.7* 12.6*  HCT 34.5* 37.0*  MCV 85.0  --   PLT 329  --     Coagulation Studies: Recent Labs    10/10/19 2248  LABPROT 13.5  INR 1.1    Imaging: CT HEAD CODE STROKE WO CONTRAST  Result Date: 10/10/2019 CLINICAL DATA:  Code stroke.  Found unresponsive. EXAM: CT HEAD WITHOUT CONTRAST TECHNIQUE: Contiguous axial images were obtained from the base of the skull through the vertex without intravenous contrast. COMPARISON:  None. FINDINGS: Brain: There is no mass, hemorrhage or extra-axial collection. No evidence of acute cortical infarct. Large area of encephalomalacia throughout the left MCA territory. Vascular: No abnormal hyperdensity of  the major intracranial arteries or dural venous sinuses. No intracranial atherosclerosis. Skull: The visualized skull base, calvarium and extracranial soft tissues are normal. Sinuses/Orbits: No fluid levels or advanced mucosal thickening of the visualized paranasal sinuses. No mastoid or middle ear effusion. The orbits are normal. ASPECTS [facility] Stroke Program Early CT Score) - Ganglionic level infarction (caudate, lentiform nuclei, internal capsule, insula, M1-M3 cortex): 7 - Supraganglionic infarction (M4-M6 cortex): 3 Total score (0-10 with 10 being normal): 10 IMPRESSION: 1. No acute intracranial abnormality. 2. Large area of encephalomalacia throughout the left MCA territory, consistent with remote infarct. 3. ASPECTS is 10. These results were communicated to Dr. Georgiana Spinner Ronia Hazelett at 11:08 pm on 10/10/2019 by text page via the [facility] messaging system. Electronically Signed   By: Deatra Robinson M.D.   On: 10/10/2019 23:09     I have  reviewed the above imaging : Head CT shows large left hemispheric encephalomalacia   ASSESSMENT AND PLAN  59 y.o. 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 Seizure precautions Frequent neurochecks Check for urinary tract infection, blood cultures, CBC BMP, lactate, CK  CRITICAL CARE Performed by: Dara Lords Casia Corti   Addendum -Reviewed stat EEG, left epileptiform discharges but no seizures.  We will continue EEG.  Can start weaning sedation tomorrow   Total critical care time: 55 minutes  Critical care time was exclusive of separately billable procedures and treating other patients.  Critical care was necessary to treat or prevent imminent or life-threatening deterioration.  Critical care was time spent personally by me on the following activities: development of treatment plan with patient and/or surrogate as well as nursing, discussions with consultants, evaluation of patient's response to treatment, examination of patient, obtaining history from patient or surrogate, ordering and performing treatments and interventions, ordering and review of laboratory studies, ordering and review of radiographic studies, pulse oximetry and re-evaluation of patient's condition.  Aundrea Horace [facility] Pager Number [phone]

## 2019-10-10 NOTE — ED Notes (Signed)
Ativan 2 mg IV given.

## 2019-10-10 NOTE — ED Notes (Signed)
Ativan 2mg  IV given at this time per VO Dr.Arour

## 2019-10-11 ENCOUNTER — Inpatient Hospital Stay (HOSPITAL_COMMUNITY): Payer: BC Managed Care – PPO

## 2019-10-11 ENCOUNTER — Other Ambulatory Visit: Payer: Self-pay

## 2019-10-11 DIAGNOSIS — E876 Hypokalemia: Secondary | ICD-10-CM | POA: Diagnosis not present

## 2019-10-11 DIAGNOSIS — R569 Unspecified convulsions: Secondary | ICD-10-CM | POA: Diagnosis present

## 2019-10-11 DIAGNOSIS — R1310 Dysphagia, unspecified: Secondary | ICD-10-CM | POA: Diagnosis not present

## 2019-10-11 DIAGNOSIS — E878 Other disorders of electrolyte and fluid balance, not elsewhere classified: Secondary | ICD-10-CM | POA: Diagnosis present

## 2019-10-11 DIAGNOSIS — E11649 Type 2 diabetes mellitus with hypoglycemia without coma: Secondary | ICD-10-CM | POA: Diagnosis not present

## 2019-10-11 DIAGNOSIS — E1169 Type 2 diabetes mellitus with other specified complication: Secondary | ICD-10-CM | POA: Diagnosis present

## 2019-10-11 DIAGNOSIS — I10 Essential (primary) hypertension: Secondary | ICD-10-CM | POA: Diagnosis present

## 2019-10-11 DIAGNOSIS — R1312 Dysphagia, oropharyngeal phase: Secondary | ICD-10-CM | POA: Diagnosis present

## 2019-10-11 DIAGNOSIS — G9389 Other specified disorders of brain: Secondary | ICD-10-CM | POA: Diagnosis present

## 2019-10-11 DIAGNOSIS — G40101 Localization-related (focal) (partial) symptomatic epilepsy and epileptic syndromes with simple partial seizures, not intractable, with status epilepticus: Secondary | ICD-10-CM | POA: Diagnosis present

## 2019-10-11 DIAGNOSIS — E1165 Type 2 diabetes mellitus with hyperglycemia: Secondary | ICD-10-CM | POA: Diagnosis present

## 2019-10-11 DIAGNOSIS — G40901 Epilepsy, unspecified, not intractable, with status epilepticus: Secondary | ICD-10-CM

## 2019-10-11 DIAGNOSIS — R4182 Altered mental status, unspecified: Secondary | ICD-10-CM | POA: Diagnosis not present

## 2019-10-11 DIAGNOSIS — I69391 Dysphagia following cerebral infarction: Secondary | ICD-10-CM | POA: Diagnosis not present

## 2019-10-11 DIAGNOSIS — I69351 Hemiplegia and hemiparesis following cerebral infarction affecting right dominant side: Secondary | ICD-10-CM | POA: Diagnosis not present

## 2019-10-11 DIAGNOSIS — I69320 Aphasia following cerebral infarction: Secondary | ICD-10-CM | POA: Diagnosis not present

## 2019-10-11 DIAGNOSIS — J9601 Acute respiratory failure with hypoxia: Secondary | ICD-10-CM | POA: Diagnosis not present

## 2019-10-11 DIAGNOSIS — I6529 Occlusion and stenosis of unspecified carotid artery: Secondary | ICD-10-CM | POA: Diagnosis present

## 2019-10-11 DIAGNOSIS — Z930 Tracheostomy status: Secondary | ICD-10-CM | POA: Diagnosis not present

## 2019-10-11 DIAGNOSIS — I82611 Acute embolism and thrombosis of superficial veins of right upper extremity: Secondary | ICD-10-CM | POA: Diagnosis not present

## 2019-10-11 DIAGNOSIS — D649 Anemia, unspecified: Secondary | ICD-10-CM | POA: Diagnosis present

## 2019-10-11 DIAGNOSIS — Z20822 Contact with and (suspected) exposure to covid-19: Secondary | ICD-10-CM | POA: Diagnosis present

## 2019-10-11 DIAGNOSIS — G9340 Encephalopathy, unspecified: Secondary | ICD-10-CM | POA: Diagnosis present

## 2019-10-11 DIAGNOSIS — M7989 Other specified soft tissue disorders: Secondary | ICD-10-CM | POA: Diagnosis not present

## 2019-10-11 DIAGNOSIS — J9621 Acute and chronic respiratory failure with hypoxia: Secondary | ICD-10-CM | POA: Diagnosis present

## 2019-10-11 DIAGNOSIS — K9421 Gastrostomy hemorrhage: Secondary | ICD-10-CM | POA: Diagnosis not present

## 2019-10-11 DIAGNOSIS — I63512 Cerebral infarction due to unspecified occlusion or stenosis of left middle cerebral artery: Secondary | ICD-10-CM | POA: Diagnosis not present

## 2019-10-11 DIAGNOSIS — J690 Pneumonitis due to inhalation of food and vomit: Secondary | ICD-10-CM | POA: Diagnosis not present

## 2019-10-11 DIAGNOSIS — R945 Abnormal results of liver function studies: Secondary | ICD-10-CM | POA: Diagnosis not present

## 2019-10-11 DIAGNOSIS — E785 Hyperlipidemia, unspecified: Secondary | ICD-10-CM | POA: Diagnosis present

## 2019-10-11 DIAGNOSIS — R0603 Acute respiratory distress: Secondary | ICD-10-CM | POA: Diagnosis not present

## 2019-10-11 DIAGNOSIS — K029 Dental caries, unspecified: Secondary | ICD-10-CM | POA: Diagnosis present

## 2019-10-11 DIAGNOSIS — R935 Abnormal findings on diagnostic imaging of other abdominal regions, including retroperitoneum: Secondary | ICD-10-CM | POA: Diagnosis not present

## 2019-10-11 DIAGNOSIS — N179 Acute kidney failure, unspecified: Secondary | ICD-10-CM | POA: Diagnosis present

## 2019-10-11 DIAGNOSIS — I611 Nontraumatic intracerebral hemorrhage in hemisphere, cortical: Secondary | ICD-10-CM | POA: Diagnosis not present

## 2019-10-11 DIAGNOSIS — G039 Meningitis, unspecified: Secondary | ICD-10-CM | POA: Diagnosis not present

## 2019-10-11 DIAGNOSIS — R339 Retention of urine, unspecified: Secondary | ICD-10-CM | POA: Diagnosis not present

## 2019-10-11 DIAGNOSIS — N390 Urinary tract infection, site not specified: Secondary | ICD-10-CM | POA: Diagnosis present

## 2019-10-11 LAB — I-STAT ARTERIAL BLOOD GAS, ED
Acid-Base Excess: 2 mmol/L (ref 0.0–2.0)
Bicarbonate: 25.4 mmol/L (ref 20.0–28.0)
Calcium, Ion: 1.12 mmol/L — ABNORMAL LOW (ref 1.15–1.40)
HCT: 44 % (ref 39.0–52.0)
Hemoglobin: 15 g/dL (ref 13.0–17.0)
O2 Saturation: 100 %
Patient temperature: 98.6
Potassium: 3.4 mmol/L — ABNORMAL LOW (ref 3.5–5.1)
Sodium: 135 mmol/L (ref 135–145)
TCO2: 26 mmol/L (ref 22–32)
pCO2 arterial: 35.9 mmHg (ref 32.0–48.0)
pH, Arterial: 7.457 — ABNORMAL HIGH (ref 7.350–7.450)
pO2, Arterial: 454 mmHg — ABNORMAL HIGH (ref 83.0–108.0)

## 2019-10-11 LAB — POCT I-STAT 7, (LYTES, BLD GAS, ICA,H+H)
Acid-Base Excess: 0 mmol/L (ref 0.0–2.0)
Bicarbonate: 23.1 mmol/L (ref 20.0–28.0)
Calcium, Ion: 1.13 mmol/L — ABNORMAL LOW (ref 1.15–1.40)
HCT: 31 % — ABNORMAL LOW (ref 39.0–52.0)
Hemoglobin: 10.5 g/dL — ABNORMAL LOW (ref 13.0–17.0)
O2 Saturation: 100 %
Patient temperature: 99.7
Potassium: 3.5 mmol/L (ref 3.5–5.1)
Sodium: 137 mmol/L (ref 135–145)
TCO2: 24 mmol/L (ref 22–32)
pCO2 arterial: 33.6 mmHg (ref 32.0–48.0)
pH, Arterial: 7.448 (ref 7.350–7.450)
pO2, Arterial: 160 mmHg — ABNORMAL HIGH (ref 83.0–108.0)

## 2019-10-11 LAB — CBC WITH DIFFERENTIAL/PLATELET
Abs Immature Granulocytes: 0.11 10*3/uL — ABNORMAL HIGH (ref 0.00–0.07)
Basophils Absolute: 0 10*3/uL (ref 0.0–0.1)
Basophils Relative: 0 %
Eosinophils Absolute: 0 10*3/uL (ref 0.0–0.5)
Eosinophils Relative: 0 %
HCT: 30.6 % — ABNORMAL LOW (ref 39.0–52.0)
Hemoglobin: 10.7 g/dL — ABNORMAL LOW (ref 13.0–17.0)
Immature Granulocytes: 1 %
Lymphocytes Relative: 7 %
Lymphs Abs: 1 10*3/uL (ref 0.7–4.0)
MCH: 29.7 pg (ref 26.0–34.0)
MCHC: 35 g/dL (ref 30.0–36.0)
MCV: 85 fL (ref 80.0–100.0)
Monocytes Absolute: 1 10*3/uL (ref 0.1–1.0)
Monocytes Relative: 7 %
Neutro Abs: 13.3 10*3/uL — ABNORMAL HIGH (ref 1.7–7.7)
Neutrophils Relative %: 85 %
Platelets: 293 10*3/uL (ref 150–400)
RBC: 3.6 MIL/uL — ABNORMAL LOW (ref 4.22–5.81)
RDW: 13.4 % (ref 11.5–15.5)
WBC: 15.5 10*3/uL — ABNORMAL HIGH (ref 4.0–10.5)
nRBC: 0 % (ref 0.0–0.2)

## 2019-10-11 LAB — RAPID URINE DRUG SCREEN, HOSP PERFORMED
Amphetamines: NOT DETECTED
Barbiturates: NOT DETECTED
Benzodiazepines: POSITIVE — AB
Cocaine: NOT DETECTED
Opiates: NOT DETECTED
Tetrahydrocannabinol: NOT DETECTED

## 2019-10-11 LAB — URINALYSIS, ROUTINE W REFLEX MICROSCOPIC
Bilirubin Urine: NEGATIVE
Glucose, UA: >=500 mg/dL — AB
Ketones, ur: 5 mg/dL — AB
Leukocytes,Ua: NEGATIVE
Nitrite: NEGATIVE
Protein, ur: 100 mg/dL — AB
Specific Gravity, Urine: 1.011 (ref 1.005–1.030)
pH: 5 (ref 5.0–8.0)

## 2019-10-11 LAB — CBC
HCT: 34.3 % — ABNORMAL LOW (ref 39.0–52.0)
Hemoglobin: 11.9 g/dL — ABNORMAL LOW (ref 13.0–17.0)
MCH: 29.2 pg (ref 26.0–34.0)
MCHC: 34.7 g/dL (ref 30.0–36.0)
MCV: 84.3 fL (ref 80.0–100.0)
Platelets: 317 10*3/uL (ref 150–400)
RBC: 4.07 MIL/uL — ABNORMAL LOW (ref 4.22–5.81)
RDW: 13.2 % (ref 11.5–15.5)
WBC: 15.1 10*3/uL — ABNORMAL HIGH (ref 4.0–10.5)
nRBC: 0 % (ref 0.0–0.2)

## 2019-10-11 LAB — GLUCOSE, CAPILLARY
Glucose-Capillary: 307 mg/dL — ABNORMAL HIGH (ref 70–99)
Glucose-Capillary: 164 mg/dL — ABNORMAL HIGH (ref 70–99)
Glucose-Capillary: 116 mg/dL — ABNORMAL HIGH (ref 70–99)
Glucose-Capillary: 178 mg/dL — ABNORMAL HIGH (ref 70–99)
Glucose-Capillary: 191 mg/dL — ABNORMAL HIGH (ref 70–99)

## 2019-10-11 LAB — BASIC METABOLIC PANEL
Anion gap: 17 — ABNORMAL HIGH (ref 5–15)
BUN: 29 mg/dL — ABNORMAL HIGH (ref 6–20)
CO2: 17 mmol/L — ABNORMAL LOW (ref 22–32)
Calcium: 9.1 mg/dL (ref 8.9–10.3)
Chloride: 101 mmol/L (ref 98–111)
Creatinine, Ser: 1.63 mg/dL — ABNORMAL HIGH (ref 0.61–1.24)
GFR calc Af Amer: 53 mL/min — ABNORMAL LOW (ref >60)
GFR calc non Af Amer: 46 mL/min — ABNORMAL LOW (ref >60)
Glucose, Bld: 292 mg/dL — ABNORMAL HIGH (ref 70–99)
Potassium: 4.3 mmol/L (ref 3.5–5.1)
Sodium: 135 mmol/L (ref 135–145)

## 2019-10-11 LAB — PHOSPHORUS: Phosphorus: 3.4 mg/dL (ref 2.5–4.6)

## 2019-10-11 LAB — MAGNESIUM: Magnesium: 1.7 mg/dL (ref 1.7–2.4)

## 2019-10-11 LAB — MRSA PCR SCREENING: MRSA by PCR: NEGATIVE

## 2019-10-11 LAB — PHENYTOIN LEVEL, TOTAL: Phenytoin Lvl: 20.1 ug/mL — ABNORMAL HIGH (ref 10.0–20.0)

## 2019-10-11 LAB — TRIGLYCERIDES: Triglycerides: 111 mg/dL (ref <150)

## 2019-10-11 LAB — SARS CORONAVIRUS 2 BY RT PCR (HOSPITAL ORDER, PERFORMED IN ~~LOC~~ HOSPITAL LAB): SARS Coronavirus 2: NEGATIVE

## 2019-10-11 LAB — HIV ANTIBODY (ROUTINE TESTING W REFLEX): HIV Screen 4th Generation wRfx: NONREACTIVE

## 2019-10-11 IMAGING — DX DG ABDOMEN 1V
1 series · 1 of 1 positions shown · non-contrast
Comparison: None.

CLINICAL DATA: OG tube placement

EXAM:
ABDOMEN - 1 VIEW

[abdomen]
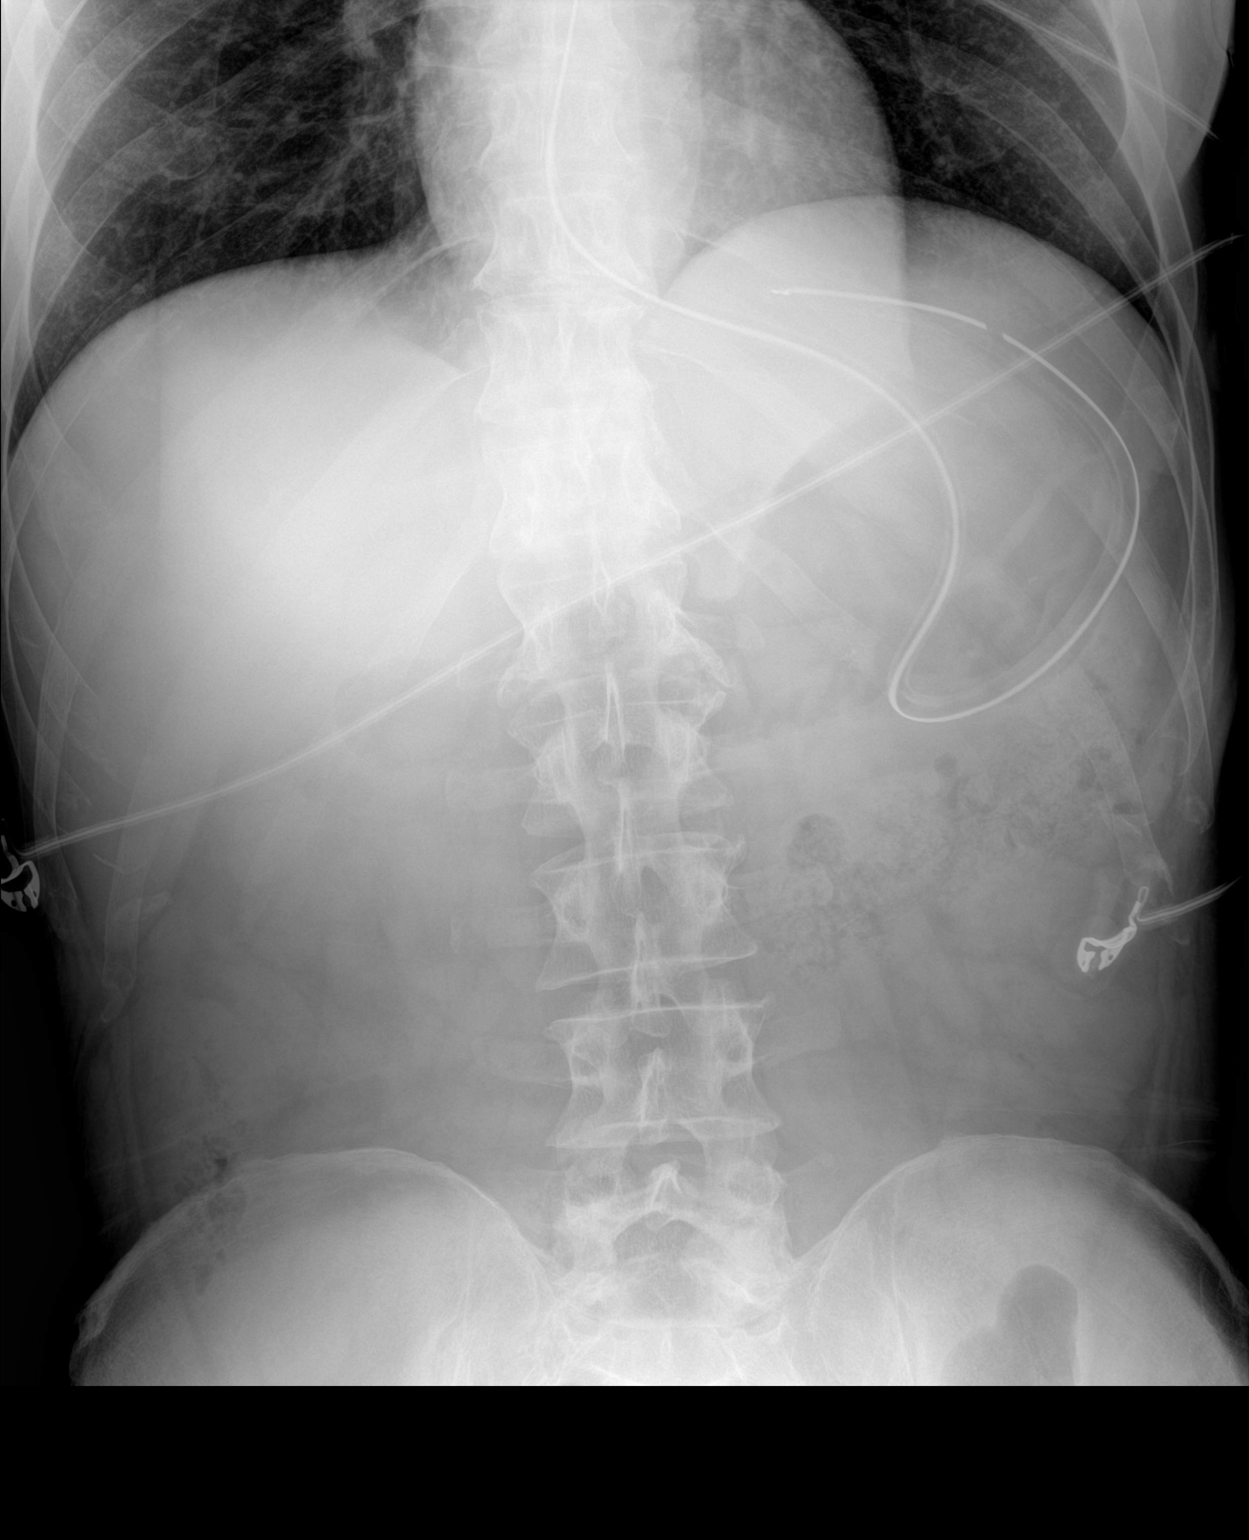

[1 of 1 positions shown; findings below may reference images not displayed]

FINDINGS: Enteric tube courses into the mid gastric body and terminates in the
gastric cardia.

Nonobstructive bowel gas pattern.

Mild degenerative changes at L1-2.
IMPRESSION: Enteric tube courses into the mid gastric body and terminates in the
gastric cardia.

## 2019-10-11 IMAGING — MR MR FACE/TRIGEMINAL WO/W CM
20 of 23 series · 39 of 48 positions shown · IV contrast (Contrast agent)
Comparison: None.

CLINICAL DATA: Found on floor unresponsive. Rule out dental
infection or facial abscess.

EXAM:
MRI FACE WITHOUT AND WITH CONTRAST
TECHNIQUE: Multiplanar, multiecho pulse sequences of the face and surrounding
structures, including thin slice imaging of the course of the
Trigeminal Nerves, were obtained both before and after
administration of intravenous contrast.
CONTRAST:  7mL GADAVIST GADOBUTROL 1 MMOL/ML IV SOLN

[Series 5: DWI · axial · 3.0mm · 0.88mm/px · z∈[-78,+50]mm · 2 of 48 slices shown (1 of 4)]
[im 1/48]
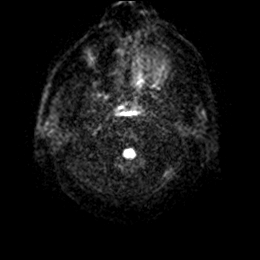
[im 48/48]
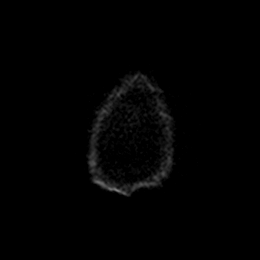

[Series 6: DWI · axial · 3.0mm · 0.88mm/px · z∈[-78,+50]mm · 2 of 48 slices shown (2 of 4)]
[im 1/48]
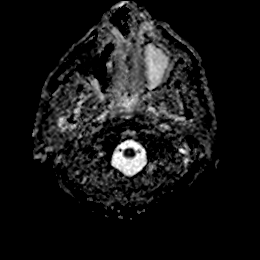
[im 48/48]
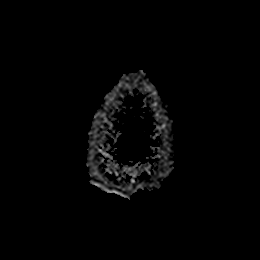

[Series 7: DWI · coronal · 4.0mm · 0.88mm/px · 2 of 40 slices shown (3 of 4)]
[im 1/40]
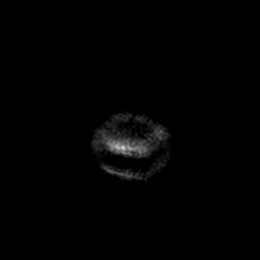
[im 40/40]
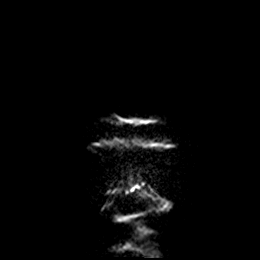

[Series 8: DWI · coronal · 4.0mm · 0.88mm/px · 2 of 40 slices shown (4 of 4)]
[im 1/40]
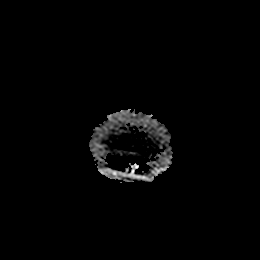
[im 40/40]
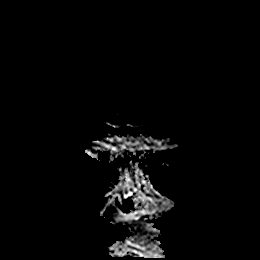

[Series 9: T1 · sagittal · 5.0mm · 0.75mm/px · 1 of 23 slices shown]
[im 1/23]
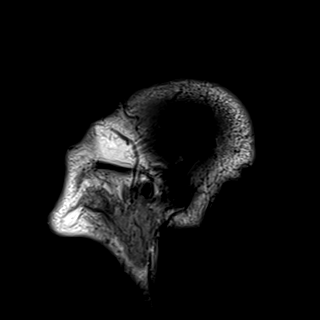

[Series 10: T2 · axial · 5.0mm · 0.72mm/px · 1 of 25 slices shown (1 of 2)]
[im 1/25]
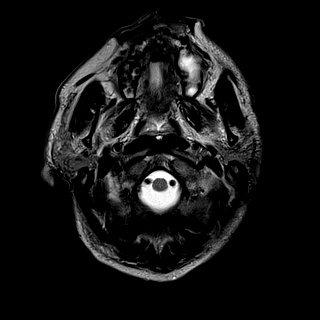

[Series 11: FLAIR · axial · 5.0mm · 0.45mm/px · 1 of 25 slices shown (1 of 2)]
[im 1/25]
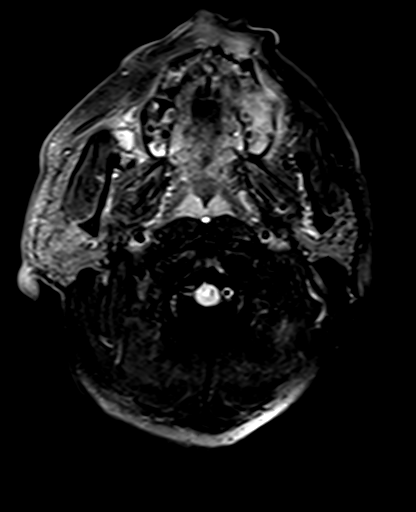

[Series 14: swi_images · axial · 3.0mm · 0.90mm/px · z∈[-80,+59]mm · 3 of 52 slices shown]
[im 1/52]
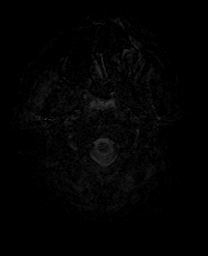
[im 26/52]
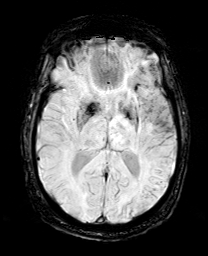
[im 52/52]
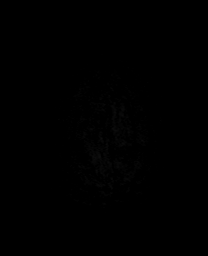

[Series 15: mip_images(sw) · axial · 24.0mm · 0.90mm/px · z∈[-71,+50]mm · 2 of 45 slices shown]
[im 1/45]
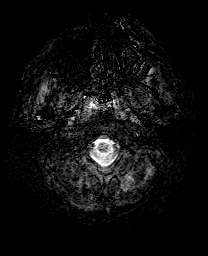
[im 45/45]
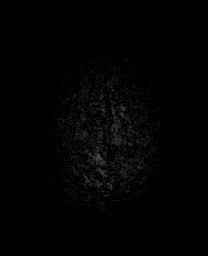

[Series 17: T2 · coronal · 3.0mm · 0.33mm/px · 2 of 37 slices shown (2 of 2)]
[im 1/37]
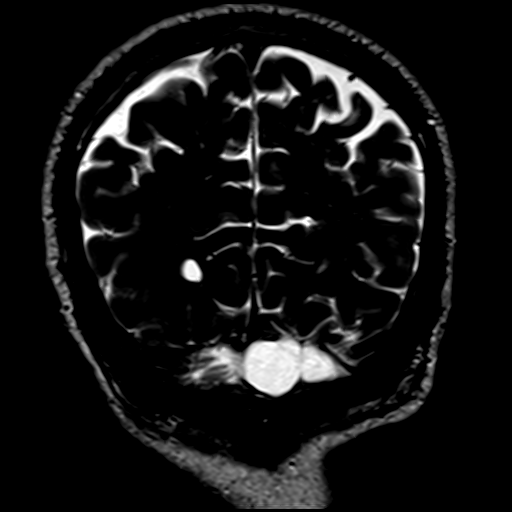
[im 37/37]
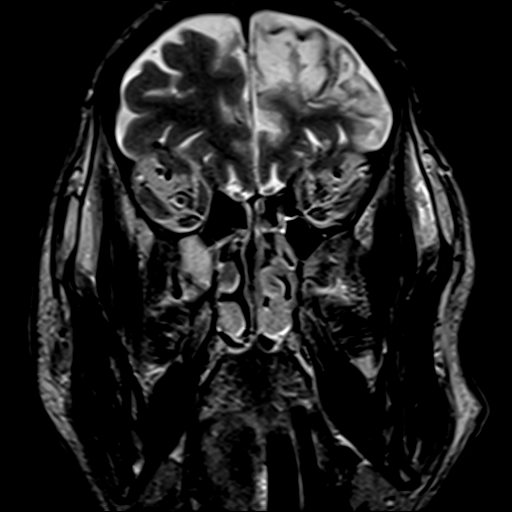

[Series 18: FLAIR · coronal · 3.0mm · 0.70mm/px · 2 of 34 slices shown (2 of 2)]
[im 1/34]
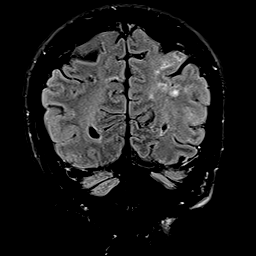
[im 34/34]
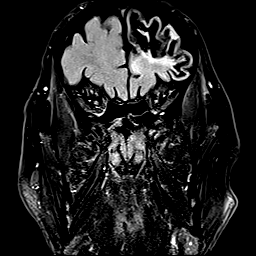

[Series 19: t1_tse_sag_2mm · sagittal · 3.0mm · 0.35mm/px · 2 of 43 slices shown]
[im 1/43]
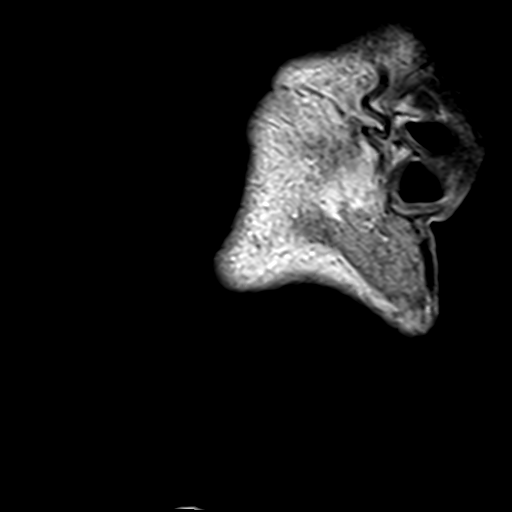
[im 43/43]
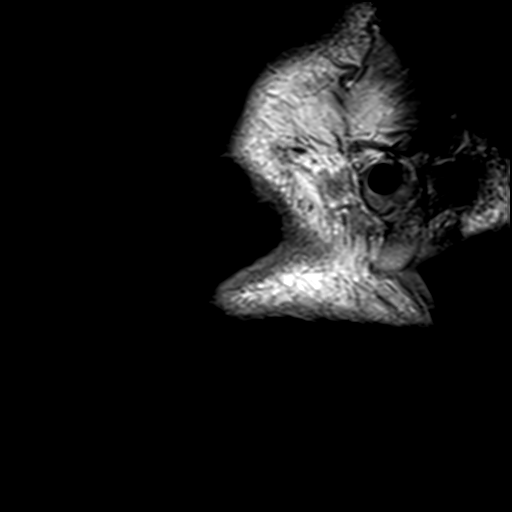

[Series 20: t2_tse_cor_3mm · coronal · 3.0mm · 0.74mm/px · 2 of 35 slices shown]
[im 1/35]
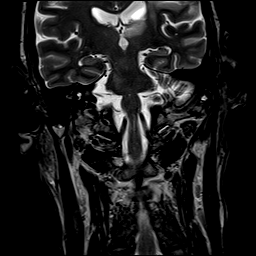
[im 35/35]
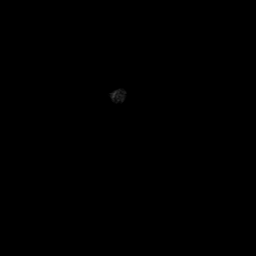

[Series 21: STIR · axial · 3.0mm · 0.70mm/px · z∈[-133,-10]mm · 2 of 35 slices shown]
[im 1/35]
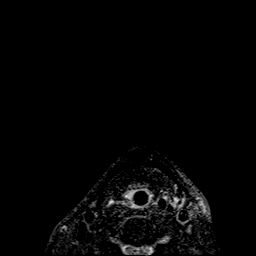
[im 35/35]
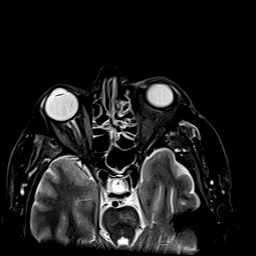

[Series 22: t2_(id)_tra_iso · axial · 1.0mm · 0.62mm/px · z∈[-133,-17]mm · 6 of 128 slices shown]
[im 1/128]
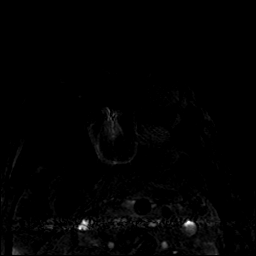
[im 26/128]
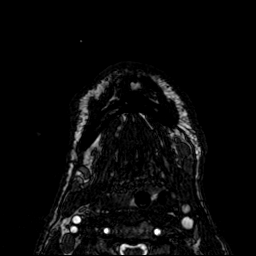
[im 51/128]
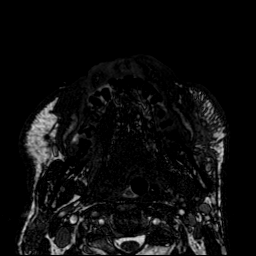
[im 77/128]
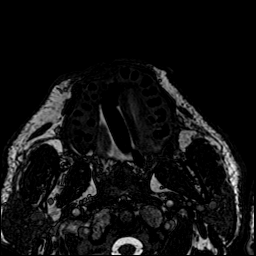
[im 102/128]
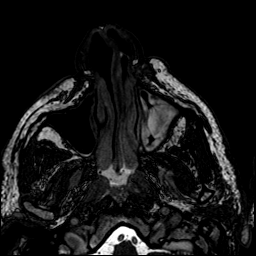
[im 128/128]
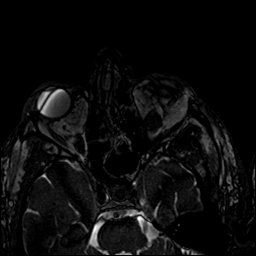

[Series 23: t1_tse_tra_3mm · axial · 3.0mm · 0.37mm/px · z∈[-129,-11]mm · 2 of 40 slices shown]
[im 1/40]
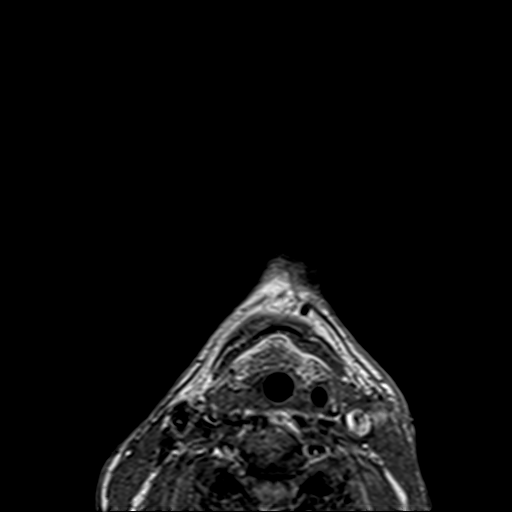
[im 40/40]
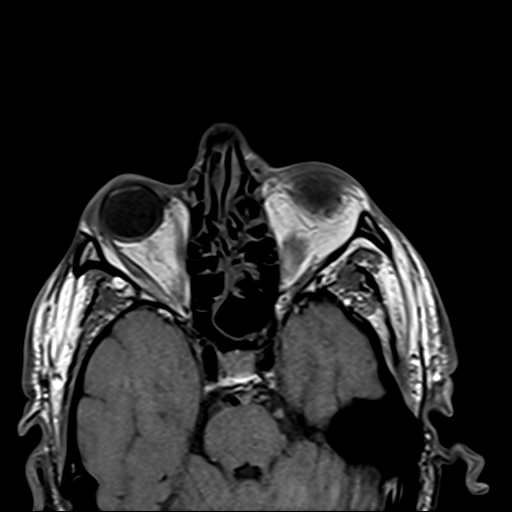

[Series 24: t1_tse_r_cor_3mm · coronal · 3.0mm · 0.33mm/px · 2 of 35 slices shown]
[im 1/35]
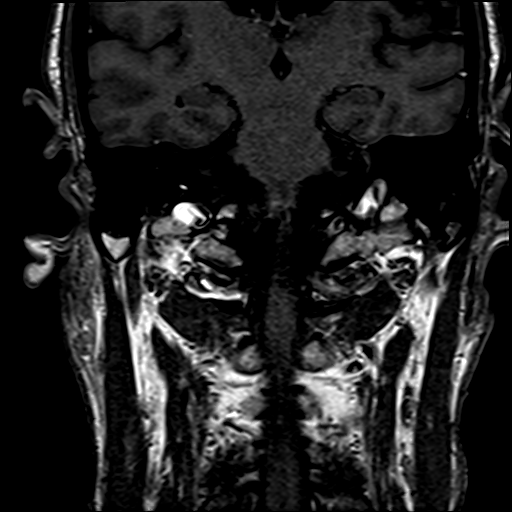
[im 35/35]
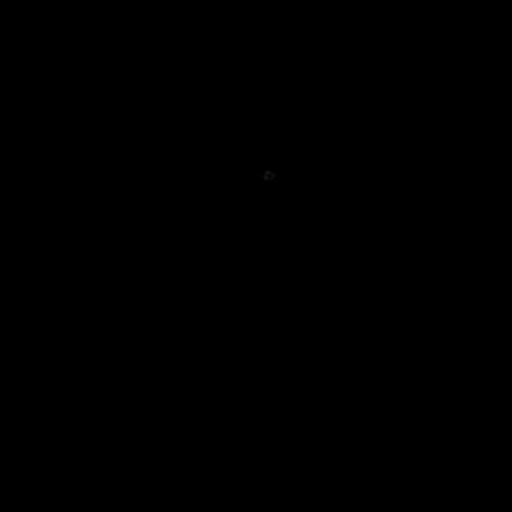

[Series 25: t1_tse_tra_3mm fs · axial · 3.0mm · 0.37mm/px · 1 of 40 slices shown]
[im 1/40]
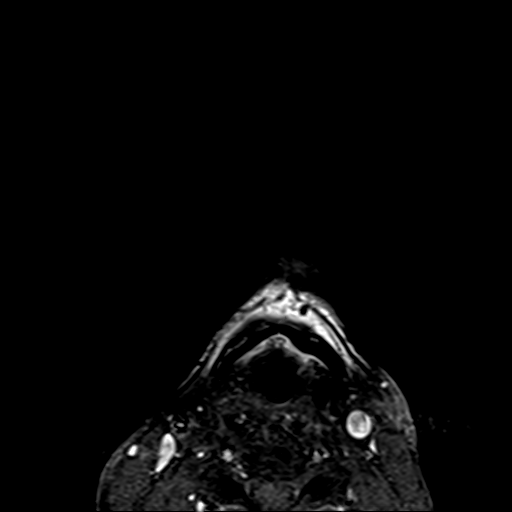

[Series 28: T1 post-contrast · coronal · 5.0mm · 0.43mm/px · 1 of 30 slices shown (1 of 2)]
[im 1/30]
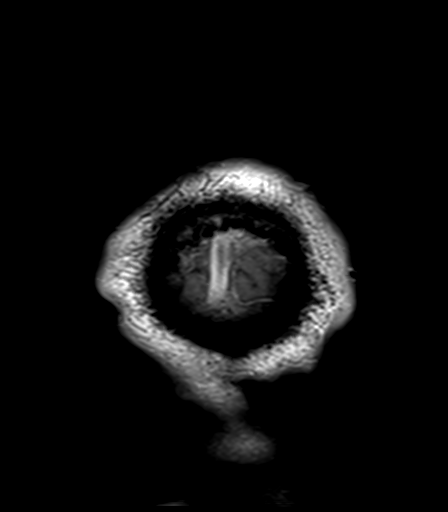

[Series 29: T1 post-contrast · sagittal · 5.0mm · 0.90mm/px · 1 of 23 slices shown (2 of 2)]
[im 1/23]
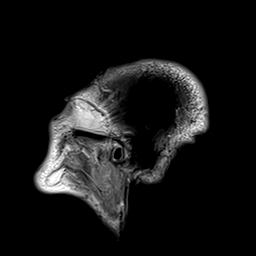

[39 of 48 positions shown; findings below may reference images not displayed]

FINDINGS: Normal orbit. No orbital mass or abscess or edema. Cavernous sinus
normal. Optic chiasm normal.

Mucosal edema throughout the paranasal sinuses most notably in the
left maxillary sinus. Endotracheal tube and NG tube identified.

Parotid and submandibular glands normal bilaterally.

Negative for facial abscess or edema. No pathologic lymph nodes are
identified. Subcentimeter submandibular nodes are present on the
left. There is a triangular-shaped a fluid collection lateral to the
maxillary teeth which is most likely saliva. This does not appear to
be loculated and there is no surrounding enhancement suggest
abscess.

Intracranial contents reported separately
IMPRESSION: Negative for soft tissue abscess in the face.

Mucosal edema throughout the paranasal sinuses.

## 2019-10-11 IMAGING — MR MR HEAD WO/W CM
19 of 23 series · 38 of 48 positions shown · IV contrast (gadavist)
Comparison: CT head [DATE]

CLINICAL DATA: Found unresponsive on floor. Rule out dental
infection or maxillary abscess. Seizure.

EXAM:
MRI HEAD WITHOUT AND WITH CONTRAST
TECHNIQUE: Multiplanar, multiecho pulse sequences of the brain and surrounding
structures were obtained without and with intravenous contrast.
CONTRAST:  7mL GADAVIST GADOBUTROL 1 MMOL/ML IV SOLN

[Series 5: DWI · axial · 3.0mm · 0.88mm/px · z∈[-78,+50]mm · 2 of 48 slices shown (1 of 4)]
[im 1/48]
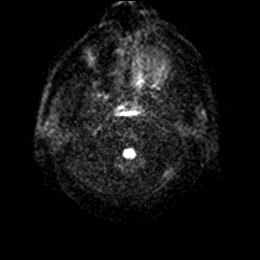
[im 48/48]
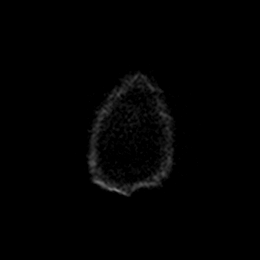

[Series 6: DWI · axial · 3.0mm · 0.88mm/px · z∈[-78,+50]mm · 2 of 48 slices shown (2 of 4)]
[im 1/48]
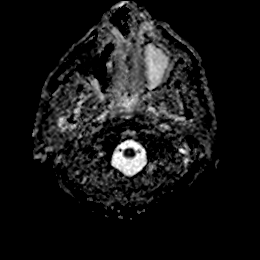
[im 48/48]
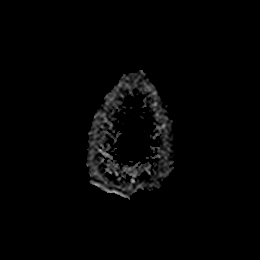

[Series 7: DWI · coronal · 4.0mm · 0.88mm/px · 2 of 40 slices shown (3 of 4)]
[im 1/40]
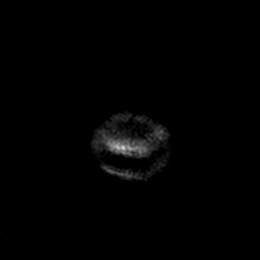
[im 40/40]
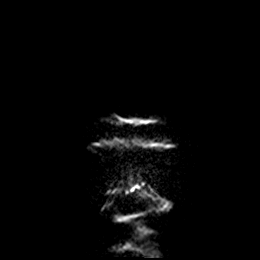

[Series 8: DWI · coronal · 4.0mm · 0.88mm/px · 2 of 40 slices shown (4 of 4)]
[im 1/40]
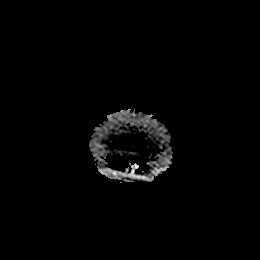
[im 40/40]
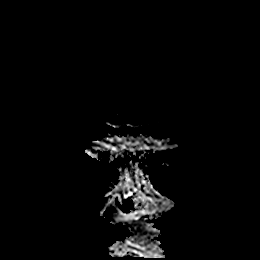

[Series 9: T1 · sagittal · 5.0mm · 0.75mm/px · 1 of 23 slices shown]
[im 1/23]
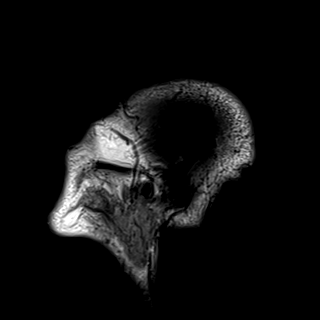

[Series 10: T2 · axial · 5.0mm · 0.72mm/px · 1 of 25 slices shown (1 of 2)]
[im 1/25]
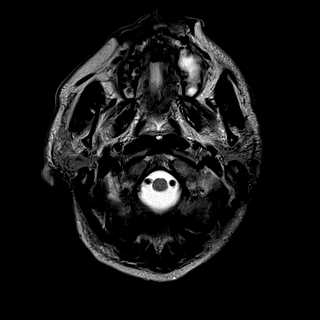

[Series 11: FLAIR · axial · 5.0mm · 0.45mm/px · 1 of 25 slices shown (1 of 2)]
[im 1/25]
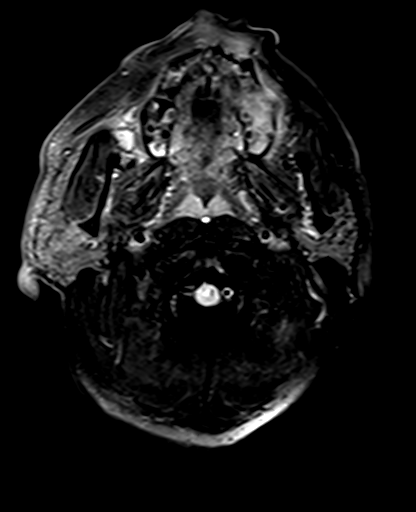

[Series 14: swi_images · axial · 3.0mm · 0.90mm/px · z∈[-80,+59]mm · 3 of 52 slices shown]
[im 1/52]
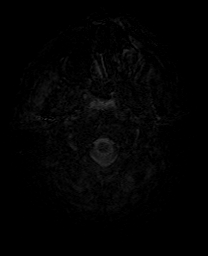
[im 26/52]
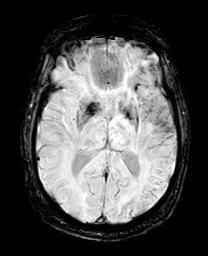
[im 52/52]
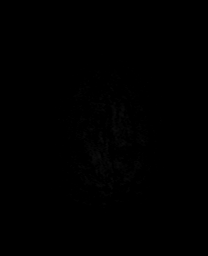

[Series 15: mip_images(sw) · axial · 24.0mm · 0.90mm/px · z∈[-71,+50]mm · 2 of 45 slices shown]
[im 1/45]
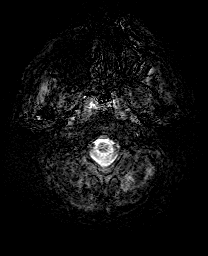
[im 45/45]
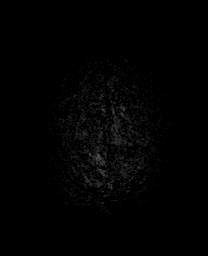

[Series 17: T2 · coronal · 3.0mm · 0.33mm/px · 2 of 37 slices shown (2 of 2)]
[im 1/37]
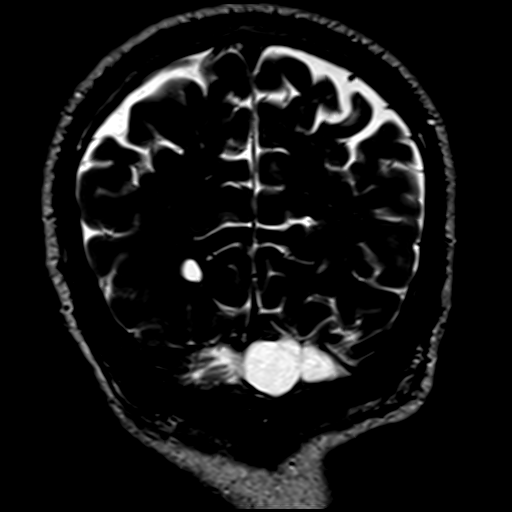
[im 37/37]
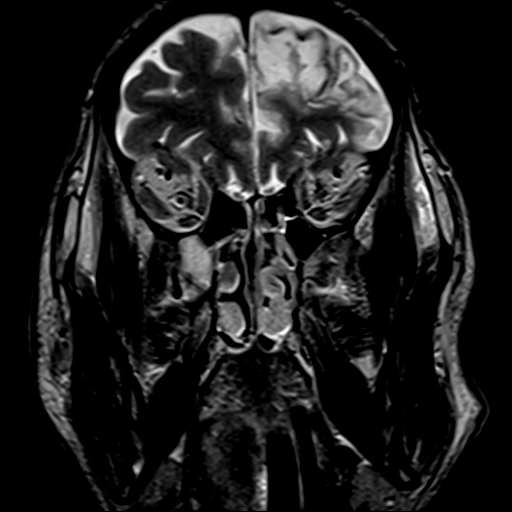

[Series 18: FLAIR · coronal · 3.0mm · 0.70mm/px · 2 of 34 slices shown (2 of 2)]
[im 1/34]
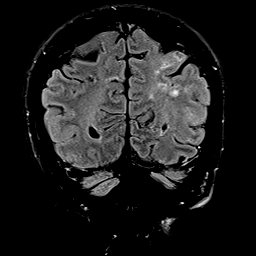
[im 34/34]
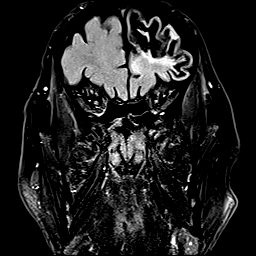

[Series 19: t1_tse_sag_2mm · sagittal · 3.0mm · 0.35mm/px · 2 of 43 slices shown]
[im 1/43]
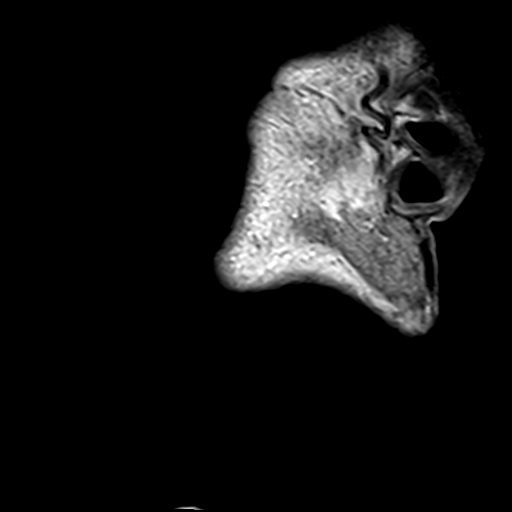
[im 43/43]
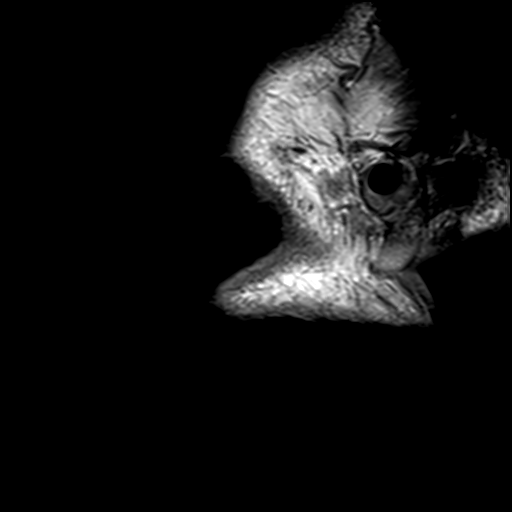

[Series 20: t2_tse_cor_3mm · coronal · 3.0mm · 0.74mm/px · 2 of 35 slices shown]
[im 1/35]
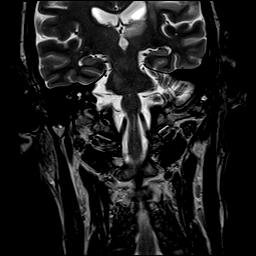
[im 35/35]
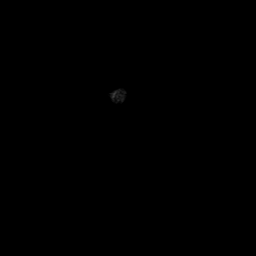

[Series 21: STIR · axial · 3.0mm · 0.70mm/px · z∈[-133,-10]mm · 2 of 35 slices shown]
[im 1/35]
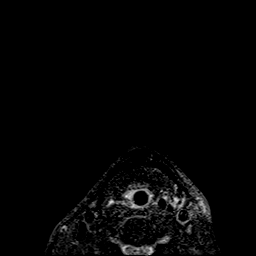
[im 35/35]
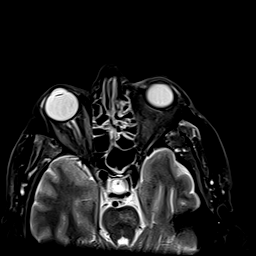

[Series 22: t2_(id)_tra_iso · axial · 1.0mm · 0.62mm/px · z∈[-133,-17]mm · 6 of 128 slices shown]
[im 1/128]
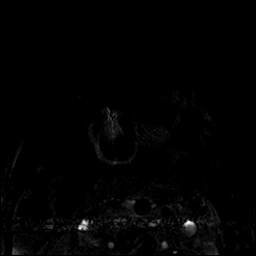
[im 26/128]
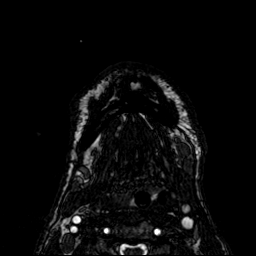
[im 51/128]
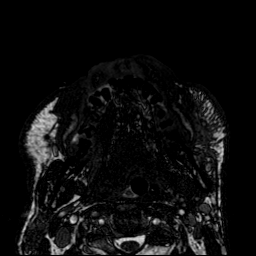
[im 77/128]
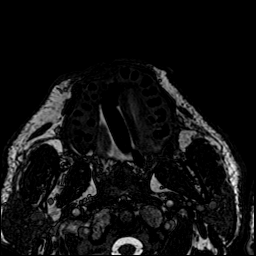
[im 102/128]
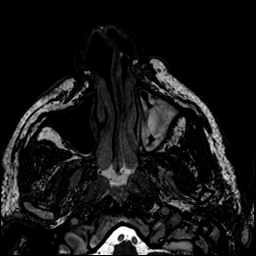
[im 128/128]
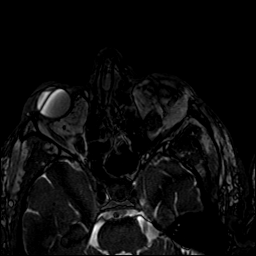

[Series 23: t1_tse_tra_3mm · axial · 3.0mm · 0.37mm/px · z∈[-129,-11]mm · 2 of 40 slices shown]
[im 1/40]
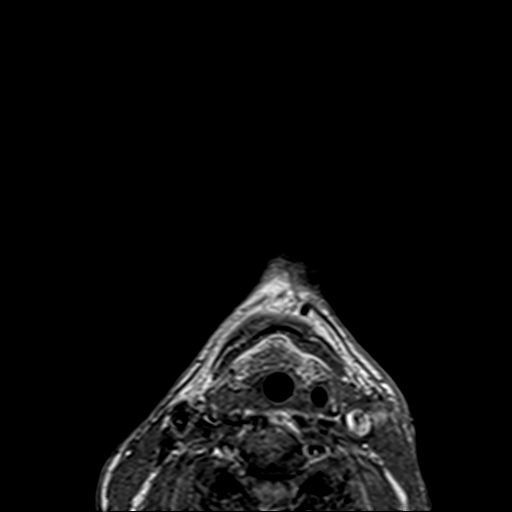
[im 40/40]
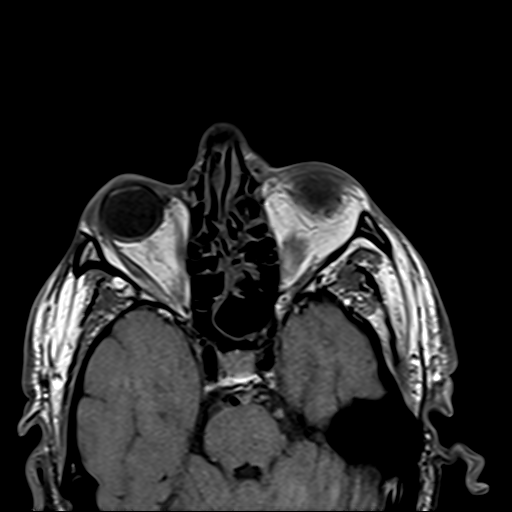

[Series 24: t1_tse_r_cor_3mm · coronal · 3.0mm · 0.33mm/px · 2 of 35 slices shown]
[im 1/35]
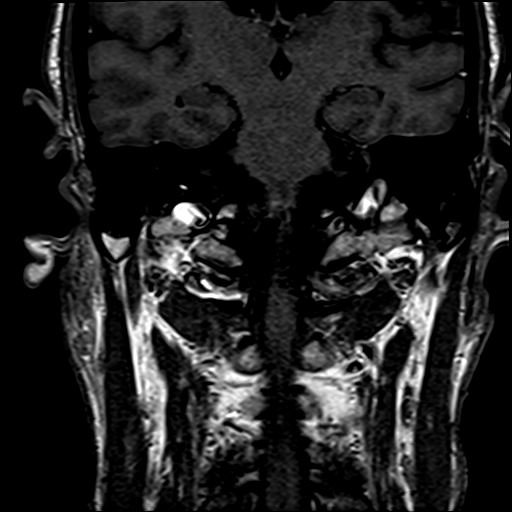
[im 35/35]
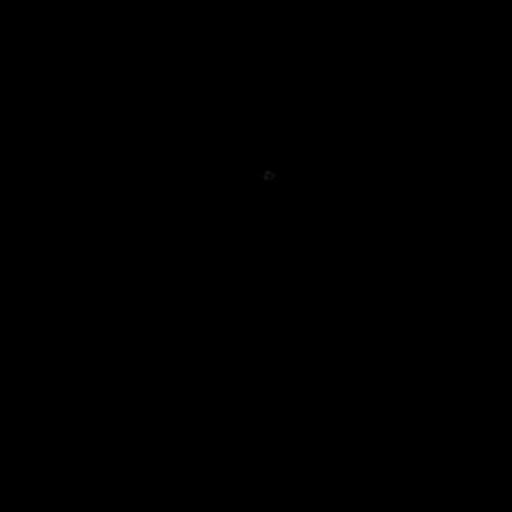

[Series 28: T1 post-contrast · coronal · 5.0mm · 0.43mm/px · 1 of 30 slices shown (1 of 2)]
[im 1/30]
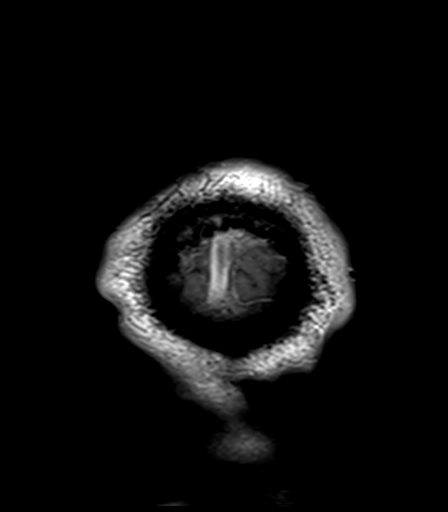

[Series 29: T1 post-contrast · sagittal · 5.0mm · 0.90mm/px · 1 of 23 slices shown (2 of 2)]
[im 1/23]
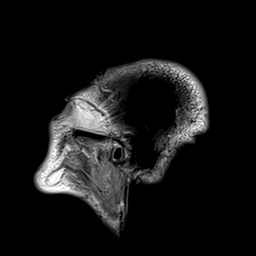

[38 of 48 positions shown; findings below may reference images not displayed]

FINDINGS: Brain: Diffusion-weighted imaging negative for acute infarct. There
is a large area of chronic infarction in the left frontal lobe with
areas of chronic hemorrhage. Hypodensity in the left basal ganglia
and left thalamus also present which has the appearance of
ill-defined edema rather than chronic ischemia. There is mild
enhancement in the left basal ganglia and also some mild cortical
enhancement in the left frontal infarct following contrast infusion.

Ventricle size normal. No midline shift. Mild patchy hyperintensity
in the pons consistent with chronic microvascular ischemia. Negative

Vascular: Normal arterial flow voids. Mucosal edema paranasal
sinuses specially left maxillary sinus.

Skull and upper cervical spine: No focal skeletal lesion.

Sinuses/Orbits: Bilateral cataract extraction. No orbital mass
lesion.

Other: None
IMPRESSION: Large territory chronic hemorrhagic infarct in the left frontal
lobe. There is methemoglobin in the infarct. There is mild cortical
enhancement which could be due a subacute extension of the infarct.

There is ill-defined hyperintensity throughout the left basal
ganglia and left thalamus which is not typical for chronic infarct.
In addition the left thalamus is not in the left MCA territory.
There is mild enhancement in the putamen following contrast
administration. This does not show restricted diffusion.
Differential diagnosis includes encephalitis, infiltrating tumor,
and atypical subacute infarction.

Sinus mucosal disease.

## 2019-10-11 MED ORDER — DOCUSATE SODIUM 50 MG/5ML PO LIQD: 100.0000 mg | Freq: Two times a day (BID) | ORAL | Status: DC

## 2019-10-11 MED ORDER — CLOPIDOGREL BISULFATE 75 MG PO TABS: 75.0000 mg | ORAL_TABLET | Freq: Every day | ORAL | Status: DC

## 2019-10-11 MED ORDER — CLOPIDOGREL BISULFATE 75 MG PO TABS
75.0000 mg | ORAL_TABLET | Freq: Every day | ORAL | Status: DC
  Filled 2019-10-11: qty 1

## 2019-10-11 MED ORDER — VANCOMYCIN HCL IN DEXTROSE 1-5 GM/200ML-% IV SOLN: 1000.0000 mg | Freq: Once | INTRAVENOUS | Status: DC

## 2019-10-11 MED ORDER — ACETAMINOPHEN 160 MG/5ML PO SOLN
650.0000 mg | Freq: Four times a day (QID) | ORAL | Status: DC | PRN
  Administered 2019-10-11 – 2019-11-21 (×22): 650 mg
  Filled 2019-10-11 (×24): qty 20.3

## 2019-10-11 MED ORDER — FENTANYL CITRATE (PF) 100 MCG/2ML IJ SOLN: 50.0000 ug | INTRAMUSCULAR | Status: DC | PRN

## 2019-10-11 MED ORDER — VANCOMYCIN HCL 1500 MG/300ML IV SOLN
1500.0000 mg | Freq: Once | INTRAVENOUS | Status: AC
  Administered 2019-10-11: 1500 mg via INTRAVENOUS
  Filled 2019-10-11: qty 300

## 2019-10-11 MED ORDER — POLYETHYLENE GLYCOL 3350 17 G PO PACK
17.0000 g | PACK | Freq: Every day | ORAL | Status: DC
  Administered 2019-10-12: 17 g via ORAL
  Filled 2019-10-11: qty 1

## 2019-10-11 MED ORDER — GADOBUTROL 1 MMOL/ML IV SOLN
7.0000 mL | Freq: Once | INTRAVENOUS | Status: AC | PRN
  Administered 2019-10-11: 7 mL via INTRAVENOUS

## 2019-10-11 MED ORDER — LOSARTAN POTASSIUM 50 MG PO TABS: 25.0000 mg | ORAL_TABLET | Freq: Every day | ORAL | Status: DC

## 2019-10-11 MED ORDER — AMLODIPINE BESYLATE 10 MG PO TABS
10.0000 mg | ORAL_TABLET | Freq: Every day | ORAL | Status: DC
  Administered 2019-10-11 – 2019-10-14 (×4): 10 mg
  Filled 2019-10-11 (×4): qty 1

## 2019-10-11 MED ORDER — DOCUSATE SODIUM 100 MG PO CAPS: 100.0000 mg | ORAL_CAPSULE | Freq: Two times a day (BID) | ORAL | Status: DC | PRN

## 2019-10-11 MED ORDER — ASPIRIN 81 MG PO CHEW
81.0000 mg | CHEWABLE_TABLET | Freq: Every day | ORAL | Status: DC
  Filled 2019-10-11: qty 1

## 2019-10-11 MED ORDER — DOCUSATE SODIUM 50 MG/5ML PO LIQD
100.0000 mg | Freq: Two times a day (BID) | ORAL | Status: DC
  Administered 2019-10-11 – 2019-10-14 (×6): 100 mg
  Filled 2019-10-11 (×9): qty 10

## 2019-10-11 MED ORDER — HEPARIN SODIUM (PORCINE) 5000 UNIT/ML IJ SOLN
5000.0000 [IU] | Freq: Three times a day (TID) | INTRAMUSCULAR | Status: DC
  Administered 2019-10-11 – 2019-10-16 (×17): 5000 [IU] via SUBCUTANEOUS
  Filled 2019-10-11 (×16): qty 1

## 2019-10-11 MED ORDER — AMLODIPINE BESYLATE 10 MG PO TABS
10.0000 mg | ORAL_TABLET | Freq: Every day | ORAL | Status: DC
  Filled 2019-10-11: qty 1

## 2019-10-11 MED ORDER — INSULIN ASPART 100 UNIT/ML ~~LOC~~ SOLN
0.0000 [IU] | SUBCUTANEOUS | Status: DC
  Administered 2019-10-11: 3 [IU] via SUBCUTANEOUS
  Administered 2019-10-11: 11 [IU] via SUBCUTANEOUS
  Administered 2019-10-11 – 2019-10-12 (×4): 3 [IU] via SUBCUTANEOUS
  Administered 2019-10-12: 5 [IU] via SUBCUTANEOUS
  Administered 2019-10-12: 3 [IU] via SUBCUTANEOUS
  Administered 2019-10-12: 5 [IU] via SUBCUTANEOUS
  Administered 2019-10-13 (×2): 15 [IU] via SUBCUTANEOUS
  Administered 2019-10-13: 5 [IU] via SUBCUTANEOUS
  Administered 2019-10-14: 2 [IU] via SUBCUTANEOUS
  Administered 2019-10-14: 3 [IU] via SUBCUTANEOUS
  Administered 2019-10-14: 5 [IU] via SUBCUTANEOUS
  Administered 2019-10-14: 2 [IU] via SUBCUTANEOUS
  Administered 2019-10-14: 8 [IU] via SUBCUTANEOUS
  Administered 2019-10-14 – 2019-10-16 (×4): 2 [IU] via SUBCUTANEOUS
  Administered 2019-10-16: 3 [IU] via SUBCUTANEOUS
  Administered 2019-10-17: 2 [IU] via SUBCUTANEOUS
  Administered 2019-10-17 (×4): 5 [IU] via SUBCUTANEOUS
  Administered 2019-10-18: 11 [IU] via SUBCUTANEOUS
  Administered 2019-10-18: 3 [IU] via SUBCUTANEOUS
  Administered 2019-10-18: 8 [IU] via SUBCUTANEOUS
  Administered 2019-10-18: 5 [IU] via SUBCUTANEOUS
  Administered 2019-10-18: 3 [IU] via SUBCUTANEOUS
  Administered 2019-10-18: 8 [IU] via SUBCUTANEOUS
  Administered 2019-10-19: 3 [IU] via SUBCUTANEOUS
  Administered 2019-10-19: 5 [IU] via SUBCUTANEOUS
  Administered 2019-10-19: 8 [IU] via SUBCUTANEOUS
  Administered 2019-10-19: 5 [IU] via SUBCUTANEOUS
  Administered 2019-10-19 – 2019-10-20 (×2): 3 [IU] via SUBCUTANEOUS
  Administered 2019-10-20: 2 [IU] via SUBCUTANEOUS
  Administered 2019-10-20: 8 [IU] via SUBCUTANEOUS
  Administered 2019-10-20 (×2): 5 [IU] via SUBCUTANEOUS
  Administered 2019-10-20: 11 [IU] via SUBCUTANEOUS
  Administered 2019-10-20 – 2019-10-21 (×2): 8 [IU] via SUBCUTANEOUS
  Administered 2019-10-21: 3 [IU] via SUBCUTANEOUS
  Administered 2019-10-21: 2 [IU] via SUBCUTANEOUS
  Administered 2019-10-21: 5 [IU] via SUBCUTANEOUS
  Administered 2019-10-21: 3 [IU] via SUBCUTANEOUS
  Administered 2019-10-22: 2 [IU] via SUBCUTANEOUS
  Administered 2019-10-22 (×2): 3 [IU] via SUBCUTANEOUS
  Administered 2019-10-23 (×2): 2 [IU] via SUBCUTANEOUS
  Administered 2019-10-23: 5 [IU] via SUBCUTANEOUS
  Administered 2019-10-23: 3 [IU] via SUBCUTANEOUS
  Administered 2019-10-23 (×2): 2 [IU] via SUBCUTANEOUS
  Administered 2019-10-23: 3 [IU] via SUBCUTANEOUS
  Administered 2019-10-24 – 2019-10-27 (×14): 2 [IU] via SUBCUTANEOUS
  Administered 2019-10-27: 3 [IU] via SUBCUTANEOUS
  Administered 2019-10-27 (×2): 2 [IU] via SUBCUTANEOUS
  Administered 2019-10-28 (×2): 3 [IU] via SUBCUTANEOUS
  Administered 2019-10-28: 5 [IU] via SUBCUTANEOUS
  Administered 2019-10-28 (×3): 3 [IU] via SUBCUTANEOUS
  Administered 2019-10-29: 2 [IU] via SUBCUTANEOUS
  Administered 2019-10-29 (×3): 3 [IU] via SUBCUTANEOUS
  Administered 2019-10-29 (×2): 5 [IU] via SUBCUTANEOUS
  Administered 2019-10-30: 3 [IU] via SUBCUTANEOUS
  Administered 2019-10-30 (×2): 5 [IU] via SUBCUTANEOUS
  Administered 2019-10-30 (×3): 8 [IU] via SUBCUTANEOUS
  Administered 2019-10-30: 3 [IU] via SUBCUTANEOUS
  Administered 2019-10-31: 2 [IU] via SUBCUTANEOUS
  Administered 2019-10-31 (×3): 5 [IU] via SUBCUTANEOUS
  Administered 2019-10-31: 8 [IU] via SUBCUTANEOUS
  Administered 2019-10-31: 5 [IU] via SUBCUTANEOUS
  Administered 2019-11-01: 3 [IU] via SUBCUTANEOUS
  Administered 2019-11-01: 5 [IU] via SUBCUTANEOUS
  Administered 2019-11-01: 8 [IU] via SUBCUTANEOUS
  Administered 2019-11-01 (×2): 5 [IU] via SUBCUTANEOUS
  Administered 2019-11-02 (×2): 3 [IU] via SUBCUTANEOUS
  Administered 2019-11-02 (×3): 5 [IU] via SUBCUTANEOUS
  Administered 2019-11-02 – 2019-11-03 (×2): 3 [IU] via SUBCUTANEOUS
  Administered 2019-11-03 (×2): 5 [IU] via SUBCUTANEOUS
  Administered 2019-11-03: 8 [IU] via SUBCUTANEOUS
  Administered 2019-11-03: 3 [IU] via SUBCUTANEOUS
  Administered 2019-11-03: 8 [IU] via SUBCUTANEOUS
  Administered 2019-11-03: 3 [IU] via SUBCUTANEOUS
  Administered 2019-11-04: 5 [IU] via SUBCUTANEOUS
  Administered 2019-11-04 (×3): 3 [IU] via SUBCUTANEOUS
  Administered 2019-11-04: 8 [IU] via SUBCUTANEOUS
  Administered 2019-11-04 – 2019-11-05 (×2): 3 [IU] via SUBCUTANEOUS
  Administered 2019-11-05: 8 [IU] via SUBCUTANEOUS
  Administered 2019-11-05 (×2): 2 [IU] via SUBCUTANEOUS
  Administered 2019-11-05 – 2019-11-06 (×3): 3 [IU] via SUBCUTANEOUS
  Administered 2019-11-06: 5 [IU] via SUBCUTANEOUS
  Administered 2019-11-06 – 2019-11-07 (×4): 3 [IU] via SUBCUTANEOUS
  Administered 2019-11-07 (×2): 5 [IU] via SUBCUTANEOUS
  Administered 2019-11-07 – 2019-11-08 (×3): 3 [IU] via SUBCUTANEOUS
  Administered 2019-11-08: 5 [IU] via SUBCUTANEOUS
  Administered 2019-11-08: 3 [IU] via SUBCUTANEOUS
  Administered 2019-11-08: 2 [IU] via SUBCUTANEOUS
  Administered 2019-11-08 (×3): 3 [IU] via SUBCUTANEOUS
  Administered 2019-11-09: 5 [IU] via SUBCUTANEOUS
  Administered 2019-11-09 (×2): 3 [IU] via SUBCUTANEOUS
  Administered 2019-11-09: 2 [IU] via SUBCUTANEOUS
  Administered 2019-11-09: 5 [IU] via SUBCUTANEOUS
  Administered 2019-11-09 – 2019-11-10 (×2): 2 [IU] via SUBCUTANEOUS
  Administered 2019-11-10: 3 [IU] via SUBCUTANEOUS
  Administered 2019-11-11 (×5): 2 [IU] via SUBCUTANEOUS
  Administered 2019-11-12 (×2): 3 [IU] via SUBCUTANEOUS
  Administered 2019-11-12 (×2): 2 [IU] via SUBCUTANEOUS
  Administered 2019-11-13 (×3): 3 [IU] via SUBCUTANEOUS
  Administered 2019-11-13: 2 [IU] via SUBCUTANEOUS
  Administered 2019-11-13 – 2019-11-14 (×4): 3 [IU] via SUBCUTANEOUS
  Administered 2019-11-14 – 2019-11-15 (×2): 2 [IU] via SUBCUTANEOUS
  Administered 2019-11-15 – 2019-11-16 (×3): 3 [IU] via SUBCUTANEOUS
  Administered 2019-11-16 (×4): 2 [IU] via SUBCUTANEOUS
  Administered 2019-11-17: 5 [IU] via SUBCUTANEOUS

## 2019-10-11 MED ORDER — POLYETHYLENE GLYCOL 3350 17 G PO PACK: 17.0000 g | PACK | Freq: Every day | ORAL | Status: DC | PRN

## 2019-10-11 MED ORDER — SODIUM CHLORIDE 0.9 % IV SOLN
2.0000 g | Freq: Two times a day (BID) | INTRAVENOUS | Status: DC
  Administered 2019-10-11 – 2019-10-16 (×11): 2 g via INTRAVENOUS
  Filled 2019-10-11 (×11): qty 20
  Filled 2019-10-11: qty 2
  Filled 2019-10-11 (×2): qty 20

## 2019-10-11 MED ORDER — AMLODIPINE BESYLATE 10 MG PO TABS: 10.0000 mg | ORAL_TABLET | Freq: Every day | ORAL | Status: DC

## 2019-10-11 MED ORDER — LABETALOL HCL 5 MG/ML IV SOLN
5.0000 mg | INTRAVENOUS | Status: DC | PRN
  Administered 2019-10-13 (×2): 5 mg via INTRAVENOUS
  Filled 2019-10-11 (×3): qty 4

## 2019-10-11 MED ORDER — PHENYTOIN SODIUM 50 MG/ML IJ SOLN
100.0000 mg | Freq: Three times a day (TID) | INTRAMUSCULAR | Status: DC
  Administered 2019-10-11 – 2019-10-18 (×22): 100 mg via INTRAVENOUS
  Filled 2019-10-11 (×22): qty 2

## 2019-10-11 MED ORDER — CHLORHEXIDINE GLUCONATE CLOTH 2 % EX PADS
6.0000 | MEDICATED_PAD | Freq: Every day | CUTANEOUS | Status: DC
  Administered 2019-10-13 – 2019-10-17 (×5): 6 via TOPICAL

## 2019-10-11 MED ORDER — ASPIRIN 81 MG PO CHEW
81.0000 mg | CHEWABLE_TABLET | Freq: Every day | ORAL | Status: DC
  Administered 2019-10-12 – 2019-10-14 (×3): 81 mg
  Filled 2019-10-11 (×3): qty 1

## 2019-10-11 MED ORDER — SODIUM CHLORIDE 0.9 % IV SOLN
INTRAVENOUS | Status: DC | PRN
  Administered 2019-10-11 – 2019-10-12 (×2): 1000 mL via INTRAVENOUS
  Administered 2019-10-13: 250 mL via INTRAVENOUS
  Administered 2019-10-13: 1000 mL via INTRAVENOUS
  Administered 2019-11-12: 250 mL via INTRAVENOUS
  Administered 2019-11-13: 1000 mL via INTRAVENOUS

## 2019-10-11 MED ORDER — LOSARTAN POTASSIUM 25 MG PO TABS
25.0000 mg | ORAL_TABLET | Freq: Every day | ORAL | Status: DC
  Administered 2019-10-11 – 2019-10-14 (×4): 25 mg
  Filled 2019-10-11 (×4): qty 1

## 2019-10-11 MED ORDER — PANTOPRAZOLE SODIUM 40 MG IV SOLR
40.0000 mg | Freq: Every day | INTRAVENOUS | Status: DC
  Administered 2019-10-11 – 2019-10-15 (×5): 40 mg via INTRAVENOUS
  Filled 2019-10-11 (×5): qty 40

## 2019-10-11 MED ORDER — LOSARTAN POTASSIUM 50 MG PO TABS
25.0000 mg | ORAL_TABLET | Freq: Every day | ORAL | Status: DC
  Filled 2019-10-11: qty 1

## 2019-10-11 MED ORDER — ORAL CARE MOUTH RINSE
15.0000 mL | OROMUCOSAL | Status: DC
  Administered 2019-10-11 – 2019-10-14 (×31): 15 mL via OROMUCOSAL

## 2019-10-11 MED ORDER — LEVETIRACETAM IN NACL 1000 MG/100ML IV SOLN
1000.0000 mg | Freq: Two times a day (BID) | INTRAVENOUS | Status: DC
  Administered 2019-10-11 – 2019-10-12 (×3): 1000 mg via INTRAVENOUS
  Filled 2019-10-11 (×3): qty 100

## 2019-10-11 MED ORDER — SODIUM CHLORIDE 0.9 % IV SOLN
2.0000 g | INTRAVENOUS | Status: DC
  Administered 2019-10-11 – 2019-10-12 (×6): 2 g via INTRAVENOUS
  Filled 2019-10-11 (×9): qty 2000
  Filled 2019-10-11 (×3): qty 2

## 2019-10-11 MED ORDER — VANCOMYCIN HCL 750 MG/150ML IV SOLN
750.0000 mg | Freq: Two times a day (BID) | INTRAVENOUS | Status: DC
  Administered 2019-10-12 – 2019-10-13 (×4): 750 mg via INTRAVENOUS
  Filled 2019-10-11 (×5): qty 150

## 2019-10-11 MED ORDER — FENTANYL CITRATE (PF) 100 MCG/2ML IJ SOLN
50.0000 ug | INTRAMUSCULAR | Status: DC | PRN
  Administered 2019-10-12: 200 ug via INTRAVENOUS
  Administered 2019-10-12 – 2019-10-14 (×6): 100 ug via INTRAVENOUS
  Filled 2019-10-11 (×3): qty 2
  Filled 2019-10-11: qty 4
  Filled 2019-10-11 (×3): qty 2

## 2019-10-11 MED ORDER — CHLORHEXIDINE GLUCONATE 0.12% ORAL RINSE (MEDLINE KIT)
15.0000 mL | Freq: Two times a day (BID) | OROMUCOSAL | Status: DC
  Administered 2019-10-11 – 2019-10-18 (×14): 15 mL via OROMUCOSAL

## 2019-10-11 NOTE — Procedures (Addendum)
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 EEG to evaluate for seizure  Level of alertness:  comatose  AEDs during EEG study: LEV, PHT, Propofol  Technical aspects: This EEG study was done with scalp electrodes positioned according to the 10-20 International system of electrode placement. Electrical activity was acquired at a sampling rate of 500Hz  and reviewed with a high frequency filter of 70Hz  and a low frequency filter of 1Hz . EEG data were recorded continuously and digitally stored.   Description: EEG showed continuous generalized and lateralized 3 to 6 Hz theta-delta slowing admixed with 15 to 18 Hz beta activity with irregular morphology distributed symmetrically and diffusely.  Sharp waves were also noted in left frontotemporal region. Hyperventilation and photic stimulation were not performed.     ABNORMALITY - Sharp waves, left frontotemporal region -Continuous slow, generalized and lateralized left hemisphere -Excessive beta, generalized  IMPRESSION: This study showed evidence of epileptogenicity in left frontotemporal region as well as cortical dysfunction in left hemisphere likely secondary to underlying encephalomalacia and post-ictal state. Additionally, there is severe diffuse encephalopathy, nonspecific etiology but likely related to sedation. No seizures were seen throughout the recording.  Taytum Scheck 

## 2019-10-11 NOTE — ED Notes (Signed)
Family at bedside. 

## 2019-10-11 NOTE — Progress Notes (Signed)
NAME:  Corey Bennett, MRN:  [id number], DOB:  Sep 03, 1960, LOS: 0 ADMISSION DATE:  10/10/2019, CONSULTATION DATE:  10/11/19 REFERRING MD:  EDP, CHIEF COMPLAINT:  seizure   Brief History   59 y.o. 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  He was intubated for airway protection and status and PCCM consulted for admission  History of present illness   Corey Bennett is a 59 y.o. 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    Family reports he was previously living in [place] prior to his stroke, he lived alone and was not found for several days.  They moved him to assisted living in [place] so he could be closer to family.  He had been doing very well, ambulating and other than recent dental infection had no other recent illnesses.   He usually walks at night and staff noted they hadn't seen him walking.  He was found on his side with R-sided twitching.   In the ED, he had R eye deviation and continuous R-sided twitching.  He was given Versed, loaded with Phenytoin and started on Dilantin.  He was intubated for airway protection and PCCM consulted for admission  Past Medical History   has a past medical history of Carotid artery occlusion, Diabetes (HCC), Hypercholesterolemia, Hypertension, Paralysis (HCC), and Stroke (HCC).  Significant Hospital Events   8/8 Admit to PCCM  Consults:  Neurology  Procedures:  8/8 ETT  Significant Diagnostic Tests:  8/8 CT head>>No acute intracranial abnormality.  Large area of encephalomalacia throughout the left MCA territory, consistent with remote infarct. 8/8 CXR>>lungs clear 8/8 UDS>>+ benzo (but given versed in ED); o/w negative   Micro Data:  8/8 Sars-CoV-2>>negative 8/8 BCx2>> 8/8 UC>>  Antimicrobials:     Interim history/subjective:  Admitted to 4N, EEG continues to show epileptiform activity. No  change in mental status or R UE/LE twitch  Objective   Blood pressure 131/75, pulse 83, temperature (!) 102.2 F (39 C), temperature source Axillary, resp. rate 18, height 6\' 1"  (1.854 m), weight 71.3 kg, SpO2 100 %.    Vent Mode: PRVC FiO2 (%):  [40 %-100 %] 40 % Set Rate:  [18 bmp] 18 bmp Vt Set:  mL] 620 mL PEEP:  [5 cmH20] 5 cmH20 Plateau Pressure:  [15 cmH20] 15 cmH20   Intake/Output Summary (Last 24 hours) at 10/11/2019 1057 Last data filed at 10/11/2019 1000 Gross per 24 hour  Intake 498.77 ml  Output 0 ml  Net 498.77 ml   Filed Weights   10/10/19 2300 10/11/19 0324  Weight: 68.7 kg 71.3 kg   General:  Appears older than stated age, sedated and intubated HEENT: MM pink/moist, ETT in place, EEG contacts placed Neuro: not responsive to painful stimuli post intubation, pupils 3 mm and responsive to light bilaterally, no clonus CV: s1s2 rrr, no m/r/g PULM:  CTAB, on full vent support GI: soft, bsx4 active  Extremities: warm/dry, no edema; 2+ pulses Skin: no rashes or lesions   Resolved Hospital Problem list     Assessment & Plan:   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 -HOB elevated 30 degrees. -Plateau pressures less than 30 cm H20.  -Follow chest x-ray, ABG prn.   -Bronchial hygiene and RT/bronchodilator protocol.   History of HTN, HL Hypertensive in the ED -continue home Asa, Plavix, Losartan, Norvasc  Type 2 DM -hold Metformin, continue Lantus and SSI   Recent dental infection Has been on PCN, per history low suspicion for infectious cause of status and suspect leukocytosis is related to sz -Check blood and  urine cultures -hold empiric abx for now  Best practice:  Diet: NPO Pain/Anxiety/Delirium protocol (if indicated): Propofol, Fentanyl VAP protocol (if indicated): HOB 30 degrees, suction prn DVT prophylaxis: heparin GI prophylaxis: protonix Glucose control: SSI Mobility: bed rest Code Status: full code Family Communication: will update Disposition: ICU  Labs   CBC: Recent Labs  Lab 10/10/19 2248 10/10/19 2251 10/11/19 0040 10/11/19 0327 10/11/19 0554  WBC 19.6*  --   --  15.1*  --   NEUTROABS 16.7*  --   --   --   --   HGB 11.7* 12.6* 15.0 11.9* 10.5*  HCT 34.5* 37.0* 44.0 34.3* 31.0*  MCV 85.0  --   --  84.3  --   PLT 329  --   --  317  --     Basic Metabolic Panel: Recent Labs  Lab 10/10/19 2248 10/10/19 2251 10/11/19 0040 10/11/19 0327 10/11/19 0554  NA 135 138 135 135 137  K 3.6 3.6 3.4* 4.3 3.5  CL 97* 99  --  101  --   CO2 21*  --   --  17*  --   GLUCOSE 306* 304*  --  292*  --   BUN 30* 30*  --  29*  --   CREATININE 1.67* 1.50*  --  1.63*  --   CALCIUM 9.5  --   --  9.1  --   MG  --   --   --  1.7  --   PHOS  --   --   --  3.4  --    GFR: Estimated Creatinine Clearance: 49.8 mL/min (A) (by C-G formula based on SCr of 1.63 mg/dL (H)). Recent Labs  Lab 10/10/19 2248 10/11/19 0327  WBC 19.6* 15.1*    Liver Function Tests: Recent Labs  Lab 10/10/19 2248  AST 23  ALT 21  ALKPHOS 118  BILITOT 0.6  PROT 8.2*  ALBUMIN 4.1   No results for input(s): LIPASE, AMYLASE in the last 168 hours. No results for input(s): AMMONIA in the last 168 hours.  ABG    Component Value Date/Time   PHART 7.448 10/11/2019 0554   PCO2ART 33.6 10/11/2019 0554   PO2ART 160 (H) 10/11/2019 0554   HCO3 23.1 10/11/2019 0554   TCO2 24 10/11/2019 0554   O2SAT 100.0 10/11/2019 0554     Coagulation Profile: Recent Labs  Lab 10/10/19 2248  INR 1.1    Cardiac Enzymes: No results for input(s): CKTOTAL, CKMB, CKMBINDEX, TROPONINI in the last 168  hours.  HbA1C: Hgb A1c MFr Bld  Date/Time Value Ref Range Status  09/04/2019 10:00 AM 7.2 (H) 4.8 - 5.6 % Final    Comment:    (NOTE) Pre diabetes:          5.7%-6.4%  Diabetes:              >6.4%  Glycemic control for   <7.0% adults with diabetes     CBG: Recent Labs  Lab 10/11/19 0341 10/11/19 0827  GLUCAP 307* 164*    Review of Systems:   Unable to obtain secondary to intubation  Past Medical History  He,  has a past medical history of Carotid artery occlusion, Diabetes (HCC), Hypercholesterolemia, Hypertension, Paralysis (HCC), and Stroke (HCC).   Surgical History    Past Surgical History:  Procedure Laterality Date  . ENDARTERECTOMY Left 09/08/2019   Procedure: LEFT CAROTID ENDARTERECTOMY;  Surgeon: Chuck Hintickson, Christopher S, MD;  [facility];  Service: Vascular;  Laterality: Left;  . PATCH ANGIOPLASTY Left 09/08/2019   Procedure: PATCH ANGIOPLASTY USING XENOSURE BIOLOGIC PATCH 1x6cm;  Surgeon: Chuck Hintickson, Christopher S, MD;  [facility];  Service: Vascular;  Laterality: Left;  . TONSILLECTOMY       Social History   reports that he has been smoking cigarettes. He has been smoking about 1.00 pack per day. He has never used smokeless tobacco. He reports current alcohol use. He reports that he does not use drugs.   Family History   His family history is not on file.   Allergies No Known Allergies   Home Medications  Prior to Admission medications   Medication Sig Start Date End Date Taking? Authorizing Provider  amLODipine (NORVASC) 10 MG tablet Take 10 mg by mouth daily.    Yes [provider]  aspirin 81 MG chewable tablet Chew 81 mg by mouth daily.   Yes [provider]  atorvastatin (LIPITOR) 80 MG tablet Take 80 mg by mouth daily.    Yes [provider]  chlorhexidine (PERIDEX) 0.12 % solution Use as directed 15 mLs in the mouth or throat 2 (two) times daily.   Yes [provider]  Cholecalciferol (VITAMIN D-3) 125 MCG  (5000 UT) TABS Take 5,000 Units by mouth daily.    Yes [provider]  clopidogrel (PLAVIX) 75 MG tablet Take 1 tablet (75 mg total) by mouth daily. 09/09/19  Yes Emilie RutterEveland, Matthew, PA-C  HYDROcodone-acetaminophen (NORCO/VICODIN) 5-325 MG tablet Take 1 tablet by mouth every 6 (six) hours as needed for moderate pain.   Yes [provider]  insulin glargine (LANTUS) 100 UNIT/ML injection Inject 12 Units into the skin at bedtime.   Yes [provider]  insulin lispro (HUMALOG) 100 UNIT/ML injection Inject 5 Units into the skin 3 (three) times daily before meals. *HOLD INSULIN IF BLOOD GLUCOSE IS LESS THAN 120.*   Yes [provider]  losartan (COZAAR) 25 MG tablet Take 25 mg by mouth daily.    Yes [provider]  metFORMIN (GLUCOPHAGE) 1000 MG tablet Take 1,000 mg by mouth 2 (two) times daily with a meal.    Yes [provider]  penicillin v potassium (VEETID) 500 MG tablet Take 500 mg by mouth 4 (four) times daily.   Yes [provider]  senna (SENOKOT) 8.6 MG TABS tablet Take 1 tablet by mouth daily.   Yes [provider]     Critical care time: 60 minutes    CRITICAL CARE Performed by: Gwynne EdingerPaul C Damare Serano   Total critical care time: 54minutes  Critical care time was exclusive of separately billable procedures and treating other patients.  Critical care was necessary to treat or prevent imminent or life-threatening deterioration.  Critical care was time spent personally by me on the following activities: development of treatment plan with patient and/or surrogate as well as nursing, discussions with consultants, evaluation of patient's response to treatment, examination of patient, obtaining history from patient or surrogate, ordering and performing  treatments and interventions, ordering and review of laboratory studies, ordering and review of radiographic studies, pulse oximetry and re-evaluation of patient's condition.   I  have independently seen and examined the patient, reviewed data, and developed an assessment and plan . A total of 48 minutes were spent in critical care assessment and medical decision making. This critical care time does not reflect procedure time, or teaching time or supervisory time of PA/NP/Med student/Med Resident, etc but could involve care discussion time.  Gwynne Edinger, MD  10/11/19 10:59 AM

## 2019-10-11 NOTE — Progress Notes (Signed)
vLTM EEG following stat EEG. Notified neuro

## 2019-10-11 NOTE — Progress Notes (Signed)
EEG completed, results pending. 

## 2019-10-11 NOTE — Progress Notes (Addendum)
Subjective: Intubated, sedated, minimally responsive     Additional history is provided by the patient's brother at bedside today.  Exam: Vitals:   10/11/19 0819 10/11/19 0900  BP: 97/67 (!) 145/77  Pulse: 90 88  Resp: 18 16  Temp:    SpO2: 100% 100%   Gen: In bed, NAD Neck stiffness not tested due to c-collar  Resp: non-labored breathing, no acute distress, breathing comfortably on the vent Cardiac: regular rate and rhythm  Abd: soft, nt Extremities: right great toe purple with black / injured toenail; no significant edema.   Neuro: Propofol paused for exam ~11:00 AM  MS: Grimaces to noxious stimuli, does not follow any commands, does not open eyes CN: L pupil 5 -> 2, right pupil 5 -> 2.5, left corneal stronger than right (eyelash brush vs. 2 drops saline). Cough/gag intact per nursing Sensorimotor: Intermittent brief RUE movements without rhythmicity or clear trigger. Moves all extremities fairly equally to noxious stimuli (withdraws, not fully antigravity) Reflexes: Hoffman's positive on the left but not the right. Bilateral toes upgoing  Pertinent Labs: Glucose on admission 300s --> 116  Cr 1.67, stable (GFR 46) WBC 19.5 -> 15.1 Ethanol undetected  Utox not checked on admission  EEG personally reviewed, showing sleep activity at time of witnessed RUE jerks this morning on exam; appreciate Dr. Melynda Ripple read (no seizures)  Impression: Corey Bennett is a 59 y.o. with a history of diabetes (a1c 7.2%), HLD, HTN, and left MCA stroke, presenting in status epilepticus, with seizures now controlled (intermittent RUE movements w/o EEG correlate) but has become febrile. Without clear source needs empiric treatment for meningitis, LP currently contraindicated due to recent plavix use.   Recommendations: - Appreciate fever workup per primary team, in the absence of clear source, additionally would want to obtain LP with glucose, protein, cell counts, bacterial culture; however this is  contraindicated in the setting of Plavix last received on 10/10/2019 (will need to confirm with ALF that he did receive this yesterday)  - Please hold Plavix in case patient requires LP (needs to be held for 1 week prior to LP  - Cover empirically for meningitis pending CSF results (vancomycin, ceftriaxone and ampicillin) - Continue EEG monitoring and frequent neurochecks - MRI brain with and without contrast (okay to discontinue and reconnect EEG for MRI) - MRI maxillofacial (given borderline kidney function) - Phenytoin level at 13:30 PM for trough (prior to 2 PM dose), continue 100 mg TID for now - Continue Keppra 1 gram q12hr, renally adjust to 750 mg q12hr if CrCl drops substantially below 50 (borderline at 49.8 right now) - Continue propofol at current dose for now, but begin to wean once EEG restarted after MRI is completed  Brooke Dare MD-PhD [facility] [phone] Today working with Ritta Slot, MD [facility] [phone]  If 7pm- 7am, please page neurology on call as listed in AMION.

## 2019-10-11 NOTE — Progress Notes (Addendum)
LTM maint complete - no skin breakdown under: Fp1 A1 F8 F7.  Head wrap removed due to sliding off.  Tape application. Set up at 130am, charges already applied.

## 2019-10-11 NOTE — Progress Notes (Signed)
LTM EEG discontinued for MRI - no skin breakdown at [facility]. Possibly to be rehooked

## 2019-10-11 NOTE — Procedures (Addendum)
Patient Name: Corey Bennett  MRN: [id number]  Epilepsy Attending: Charlsie Quest  Referring Physician/Provider: Emilie Rutter, PA Date: 10/11/2019 Duration: 24.04 mins  Patient history: 59 y.o. 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 EEG to evaluate for seizure  Level of alertness:  comatose  AEDs during EEG study: LEV, Propofol  Technical aspects: This EEG study was done with scalp electrodes positioned according to the 10-20 International system of electrode placement. Electrical activity was acquired at a sampling rate of 500Hz  and reviewed with a high frequency filter of 70Hz  and a low frequency filter of 1Hz . EEG data were recorded continuously and digitally stored.   Description: EEG showed continuous generalized and lateralized 3 to 6 Hz theta-delta slowing admixed with 15 to 18 Hz beta activity with irregular morphology distributed symmetrically and diffusely.  Sharp waves were also noted in left frontotemporal region. Hyperventilation and photic stimulation were not performed.     ABNORMALITY - Sharp waves, left frontotemporal region -Continuous slow, generalized and lateralized left hemisphere -Excessive beta, generalized  IMPRESSION: This study showed evidence of epileptogenicity in left frontotemporal region as well as cortical dysfunction in left hemisphere likely secondary to underlying encephalomalacia and post-ictal state. Additionally, there is severe diffuse encephalopathy, nonspecific etiology but likely related to sedation. No seizures were seen throughout the recording.  Dr Aroor was notified.    [name] 

## 2019-10-11 NOTE — Progress Notes (Signed)
Pharmacy Antibiotic Note  Corey Bennett is a 59 y.o. male admitted on 10/10/2019 with seizure.  Pharmacy has been consulted for vancomycin dosing for r/o meningitis; also on ampicillin + ceftriaxone. He is febrile with Tm 102.2, WBC are elevated, SCr 1.63 (was 1.2-1.5 in July).   Plan: Vancomycin 1500 mg IV load then Vancomycin 750 mg IV every 12 hours.  Goal trough 15-20 mcg/mL.  Monitor renal function, clinical progress, cultures/sensitivities F/U LOT and de-escalate as able Vancomycin trough as clinically indicated  Height: 6\' 1"  (185.4 cm) Weight: 71.3 kg (157 lb 3 oz) IBW/kg (Calculated) : 79.9  Temp (24hrs), Avg:101 F (38.3 C), Min:99.7 F (37.6 C), Max:102.2 F (39 C)  Recent Labs  Lab 10/10/19 2248 10/10/19 2251 10/11/19 0327  WBC 19.6*  --  15.1*  CREATININE 1.67* 1.50* 1.63*    Estimated Creatinine Clearance: 49.8 mL/min (A) (by C-G formula based on SCr of 1.63 mg/dL (H)).    No Known Allergies  Antimicrobials this admission: Vanc 8/8>> Ampicillin 8/8>> Ceftriaxone 8/8>>  Dose adjustments this admission:   Microbiology results: 8/8 BCx: ngtd 8/8 UCx: sent  8/8 MRSA PCR: neg  Thank you for involving pharmacy in this patient's care.  10/8, PharmD, BCPS Clinical Pharmacist Clinical phone for 10/11/2019 until 3p is 12/11/2019 10/11/2019 11:58 AM  **Pharmacist phone directory can be found on [email or web address] listed under [facility]**

## 2019-10-11 NOTE — H&P (Addendum)
NAME:  Corey Bennett, MRN:  [id number], DOB:  1961-01-27, LOS: 0 ADMISSION DATE:  10/10/2019, CONSULTATION DATE:  10/11/19 REFERRING MD:  EDP, CHIEF COMPLAINT:  seizure   Brief History   59 y.o. 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  He was intubated for airway protection and status and PCCM consulted for admission  History of present illness   Corey Bennett is a 59 y.o. 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    Family reports he was previously living in [place] prior to his stroke, he lived alone and was not found for several days.  They moved him to assisted living in [place] so he could be closer to family.  He had been doing very well, ambulating and other than recent dental infection had no other recent illnesses.   He usually walks at night and staff noted they hadn't seen him walking.  He was found on his side with R-sided twitching.   In the ED, he had R eye deviation and continuous R-sided twitching.  He was given Versed, loaded with Phenytoin and started on Dilantin.  He was intubated for airway protection and PCCM consulted for admission  Past Medical History   has a past medical history of Carotid artery occlusion, Diabetes (HCC), Hypercholesterolemia, Hypertension, Paralysis (HCC), and Stroke (HCC).  Significant Hospital Events   8/8 Admit to PCCM  Consults:  Neurology  Procedures:  8/8 ETT  Significant Diagnostic Tests:  8/8 CT head>>No acute intracranial abnormality.  Large area of encephalomalacia throughout the left MCA territory, consistent with remote infarct. 8/8 CXR>>lungs clear  Micro Data:  8/8 Sars-CoV-2>>negative 8/8 BCx2>> 8/8 UC>>  Antimicrobials:     Interim history/subjective:  As above  Objective   Blood pressure (!) 200/101, pulse (!) 102, resp. rate 18, height 6\' 1"  (1.854 m), weight 68.7 kg,  SpO2 100 %.    Vent Mode: PRVC FiO2 (%):  [100 %] 100 % Set Rate:  [18 bmp] 18 bmp Vt Set:  [620 mL] 620 mL PEEP:  [5 cmH20] 5 cmH20 Plateau Pressure:  [15 cmH20] 15 cmH20  No intake or output data in the 24 hours ending 10/11/19 0032 Filed Weights   10/10/19 2300  Weight: 68.7 kg   General:  Appears older than stated age, sedated and intubated HEENT: MM pink/moist, ETT in place Neuro: not responsive to painful stimuli post intubation, pupils 65mm and responsive to light bilaterally, no clonus CV: s1s2 rrr, no m/r/g PULM:  CTAB, on full vent support GI: soft, bsx4 active  Extremities: warm/dry, no edema  Skin: no rashes or lesions   Resolved Hospital Problem list     Assessment & Plan:   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 -HOB elevated 30 degrees. -Plateau pressures less than 30 cm H20.  -Follow chest x-ray, ABG prn.   -Bronchial hygiene and RT/bronchodilator protocol.   History of HTN, HL Hypertensive in the ED -continue home Asa, Plavix, Losartan, Norvasc  Type 2 DM -hold Metformin, continue Lantus and SSI   Recent dental infection Has been on PCN, per  history low suspicion for infectious cause of status and suspect leukocytosis is related to sz -Check blood and urine cultures -hold empiric abx for now  Best practice:  Diet: NPO Pain/Anxiety/Delirium protocol (if indicated): Propofol, Fentanyl VAP protocol (if indicated): HOB 30 degrees, suction prn DVT prophylaxis: heparin GI prophylaxis: protonix Glucose control: SSI Mobility: bed rest Code Status: full code Family Communication: brother updated at the bedside Disposition: ICU  Labs   CBC: Recent Labs   Lab 10/10/19 2248 10/10/19 2251  WBC 19.6*  --   NEUTROABS 16.7*  --   HGB 11.7* 12.6*  HCT 34.5* 37.0*  MCV 85.0  --   PLT 329  --     Basic Metabolic Panel: Recent Labs  Lab 10/10/19 2248 10/10/19 2251  NA 135 138  K 3.6 3.6  CL 97* 99  CO2 21*  --   GLUCOSE 306* 304*  BUN 30* 30*  CREATININE 1.67* 1.50*  CALCIUM 9.5  --    GFR: Estimated Creatinine Clearance: 52.2 mL/min (A) (by C-G formula based on SCr of 1.5 mg/dL (H)). Recent Labs  Lab 10/10/19 2248  WBC 19.6*    Liver Function Tests: Recent Labs  Lab 10/10/19 2248  AST 23  ALT 21  ALKPHOS 118  BILITOT 0.6  PROT 8.2*  ALBUMIN 4.1   No results for input(s): LIPASE, AMYLASE in the last 168 hours. No results for input(s): AMMONIA in the last 168 hours.  ABG    Component Value Date/Time   TCO2 23 10/10/2019 2251     Coagulation Profile: Recent Labs  Lab 10/10/19 2248  INR 1.1    Cardiac Enzymes: No results for input(s): CKTOTAL, CKMB, CKMBINDEX, TROPONINI in the last 168 hours.  HbA1C: Hgb A1c MFr Bld  Date/Time Value Ref Range Status  09/04/2019 10:00 AM 7.2 (H) 4.8 - 5.6 % Final    Comment:    (NOTE) Pre diabetes:          5.7%-6.4%  Diabetes:              >6.4%  Glycemic control for   <7.0% adults with diabetes     CBG: No results for input(s): GLUCAP in the last 168 hours.  Review of Systems:   Unable to obtain secondary to intubation  Past Medical History  He,  has a past medical history of Carotid artery occlusion, Diabetes (HCC), Hypercholesterolemia, Hypertension, Paralysis (HCC), and Stroke (HCC).   Surgical History    Past Surgical History:  Procedure Laterality Date   ENDARTERECTOMY Left 09/08/2019   Procedure: LEFT CAROTID ENDARTERECTOMY;  Surgeon: Chuck Hint, MD;  Location: [facility] OR;  Service: Vascular;  Laterality: Left;   PATCH ANGIOPLASTY Left 09/08/2019   Procedure: PATCH ANGIOPLASTY USING XENOSURE BIOLOGIC PATCH 1x6cm;  Surgeon: Chuck Hint, MD;  Location: [facility] OR;  Service: Vascular;  Laterality: Left;   TONSILLECTOMY       Social History   reports that he has been smoking cigarettes. He has been smoking about 1.00 pack per day. He has never used smokeless tobacco. He reports current alcohol use. He reports that he does not use drugs.   Family History   His family history is not on file.   Allergies No Known Allergies   Home Medications  Prior to Admission medications   Medication Sig Start Date End Date Taking? Authorizing Provider  amLODipine (NORVASC) 10 MG tablet Take 10 mg by mouth daily.    Yes [provider]  aspirin 81 MG  chewable tablet Chew 81 mg by mouth daily.   Yes [provider]  atorvastatin (LIPITOR) 80 MG tablet Take 80 mg by mouth daily.    Yes [provider]  chlorhexidine (PERIDEX) 0.12 % solution Use as directed 15 mLs in the mouth or throat 2 (two) times daily.   Yes [provider]  Cholecalciferol (VITAMIN D-3) 125 MCG (5000 UT) TABS Take 5,000 Units by mouth daily.    Yes [provider]  clopidogrel (PLAVIX) 75 MG tablet Take 1 tablet (75 mg total) by mouth daily. 09/09/19  Yes Emilie Rutter, PA-C  HYDROcodone-acetaminophen (NORCO/VICODIN) 5-325 MG tablet Take 1 tablet by mouth every 6 (six) hours as needed for moderate pain.   Yes [provider]  insulin glargine (LANTUS) 100 UNIT/ML injection Inject 12 Units into the skin at bedtime.   Yes [provider]  insulin lispro (HUMALOG) 100 UNIT/ML injection Inject 5 Units into the skin 3 (three) times daily before meals. *HOLD INSULIN IF BLOOD GLUCOSE IS LESS THAN 120.*   Yes [provider]  losartan (COZAAR) 25 MG tablet Take 25 mg by mouth daily.    Yes [provider]  metFORMIN (GLUCOPHAGE) 1000 MG tablet Take 1,000 mg by mouth 2 (two) times daily with a meal.    Yes [provider]  penicillin v potassium (VEETID) 500 MG tablet Take 500 mg  by mouth 4 (four) times daily.   Yes [provider]  senna (SENOKOT) 8.6 MG TABS tablet Take 1 tablet by mouth daily.   Yes [provider]     Critical care time: 60 minutes    CRITICAL CARE Performed by: Darcella Gasman Gleason   Total critical care time: 60 minutes  Critical care time was exclusive of separately billable procedures and treating other patients.  Critical care was necessary to treat or prevent imminent or life-threatening deterioration.  Critical care was time spent personally by me on the following activities: development of treatment plan with patient and/or surrogate as well as nursing, discussions with consultants, evaluation of patient's response to treatment, examination of patient, obtaining history from patient or surrogate, ordering and performing treatments and interventions, ordering and review of laboratory studies, ordering and review of radiographic studies, pulse oximetry and re-evaluation of patient's condition.  Darcella Gasman Gleason, PA-C Patient seen examined and chart reviewed.  Critical care plan discussed with PA Gleason.  I agree wit above assessment and plan.

## 2019-10-11 NOTE — Progress Notes (Signed)
LTM EEG hooked up and running - no initial skin breakdown - push button tested - neuro notified.  

## 2019-10-12 ENCOUNTER — Inpatient Hospital Stay (HOSPITAL_COMMUNITY): Payer: BC Managed Care – PPO

## 2019-10-12 DIAGNOSIS — R4182 Altered mental status, unspecified: Secondary | ICD-10-CM | POA: Diagnosis not present

## 2019-10-12 LAB — BASIC METABOLIC PANEL
Anion gap: 16 — ABNORMAL HIGH (ref 5–15)
BUN: 28 mg/dL — ABNORMAL HIGH (ref 6–20)
CO2: 20 mmol/L — ABNORMAL LOW (ref 22–32)
Calcium: 8.6 mg/dL — ABNORMAL LOW (ref 8.9–10.3)
Chloride: 102 mmol/L (ref 98–111)
Creatinine, Ser: 1.89 mg/dL — ABNORMAL HIGH (ref 0.61–1.24)
GFR calc Af Amer: 44 mL/min — ABNORMAL LOW (ref >60)
GFR calc non Af Amer: 38 mL/min — ABNORMAL LOW (ref >60)
Glucose, Bld: 147 mg/dL — ABNORMAL HIGH (ref 70–99)
Potassium: 3.2 mmol/L — ABNORMAL LOW (ref 3.5–5.1)
Sodium: 138 mmol/L (ref 135–145)

## 2019-10-12 LAB — PHOSPHORUS
Phosphorus: 3.5 mg/dL (ref 2.5–4.6)
Phosphorus: 3.5 mg/dL (ref 2.5–4.6)

## 2019-10-12 LAB — GLUCOSE, CAPILLARY
Glucose-Capillary: 100 mg/dL — ABNORMAL HIGH (ref 70–99)
Glucose-Capillary: 181 mg/dL — ABNORMAL HIGH (ref 70–99)
Glucose-Capillary: 195 mg/dL — ABNORMAL HIGH (ref 70–99)
Glucose-Capillary: 196 mg/dL — ABNORMAL HIGH (ref 70–99)
Glucose-Capillary: 198 mg/dL — ABNORMAL HIGH (ref 70–99)
Glucose-Capillary: 205 mg/dL — ABNORMAL HIGH (ref 70–99)
Glucose-Capillary: 236 mg/dL — ABNORMAL HIGH (ref 70–99)

## 2019-10-12 LAB — MAGNESIUM
Magnesium: 1.9 mg/dL (ref 1.7–2.4)
Magnesium: 1.8 mg/dL (ref 1.7–2.4)

## 2019-10-12 LAB — CREATININE, URINE, RANDOM: Creatinine, Urine: 104.98 mg/dL

## 2019-10-12 LAB — SODIUM, URINE, RANDOM: Sodium, Ur: 45 mmol/L

## 2019-10-12 IMAGING — DX DG CHEST 1V PORT
1 series · 2 of 2 positions shown · non-contrast
Comparison: [DATE]

CLINICAL DATA: Intubated.  Orogastric tube placement.

EXAM:
PORTABLE CHEST 1 VIEW

[Series 1: chest · 0.14mm/px · 2 of 2 slices shown]
[im 1/2]
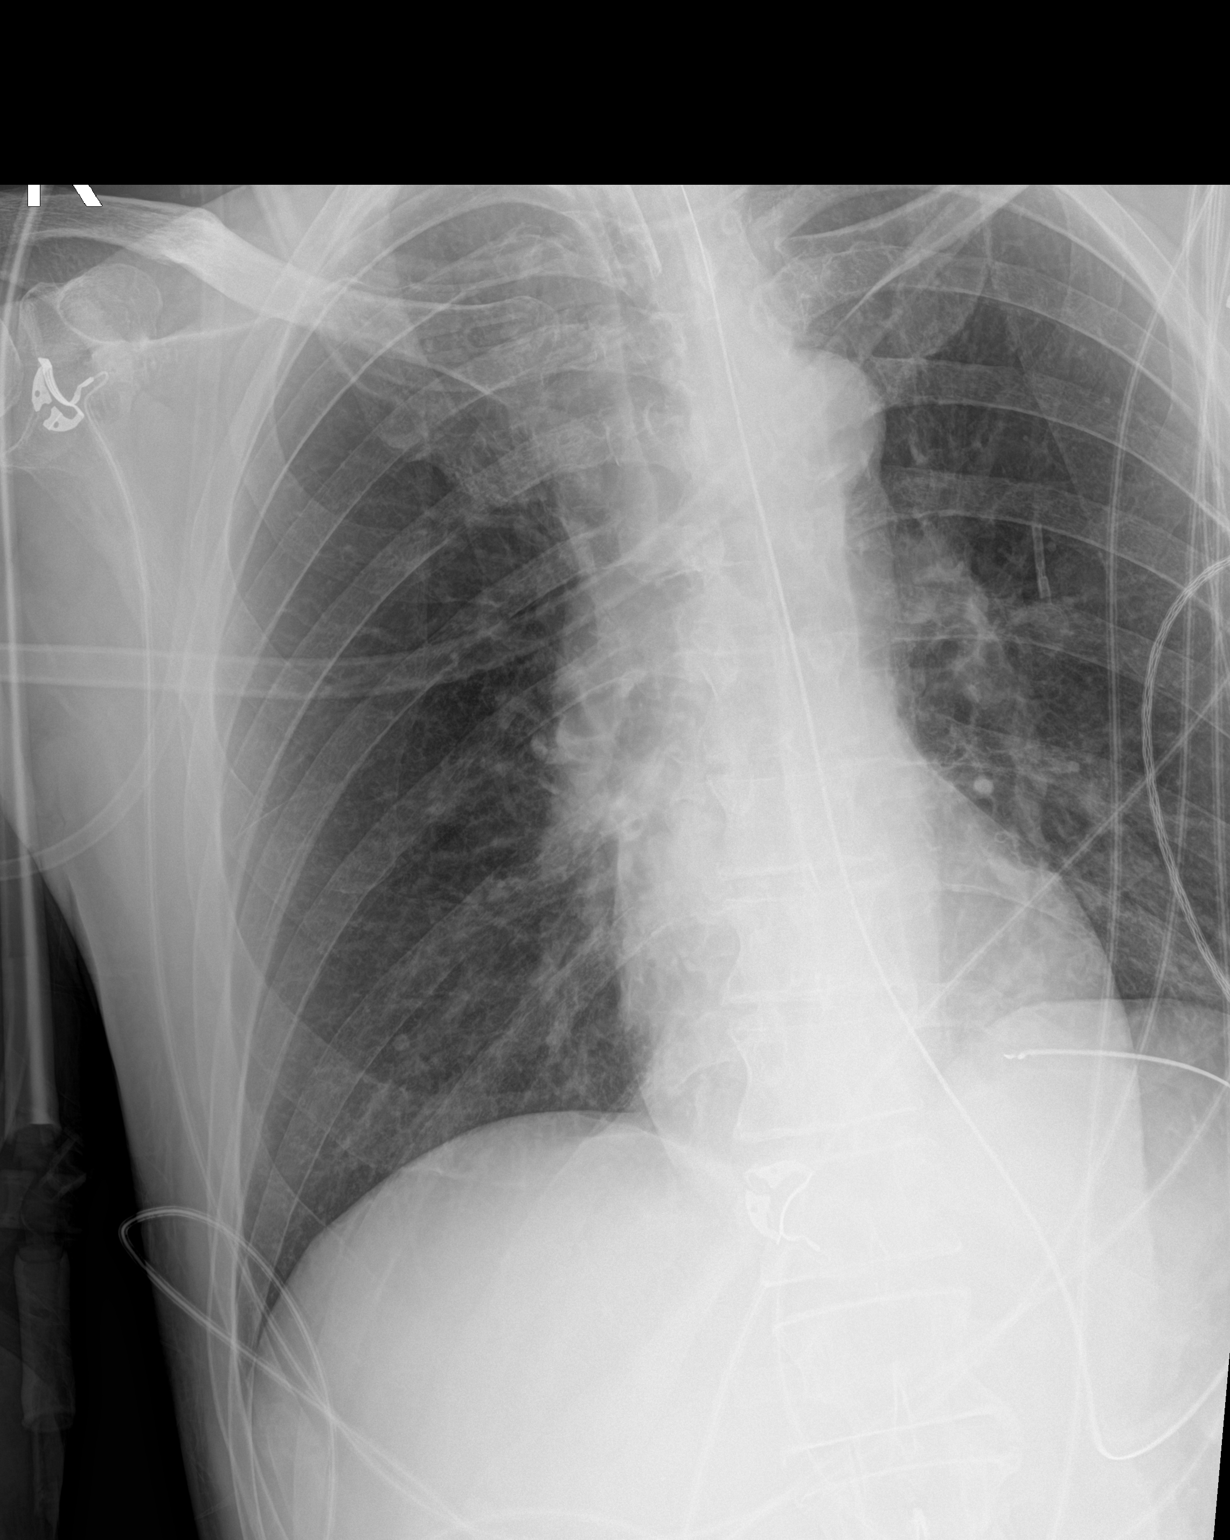
[im 2/2]
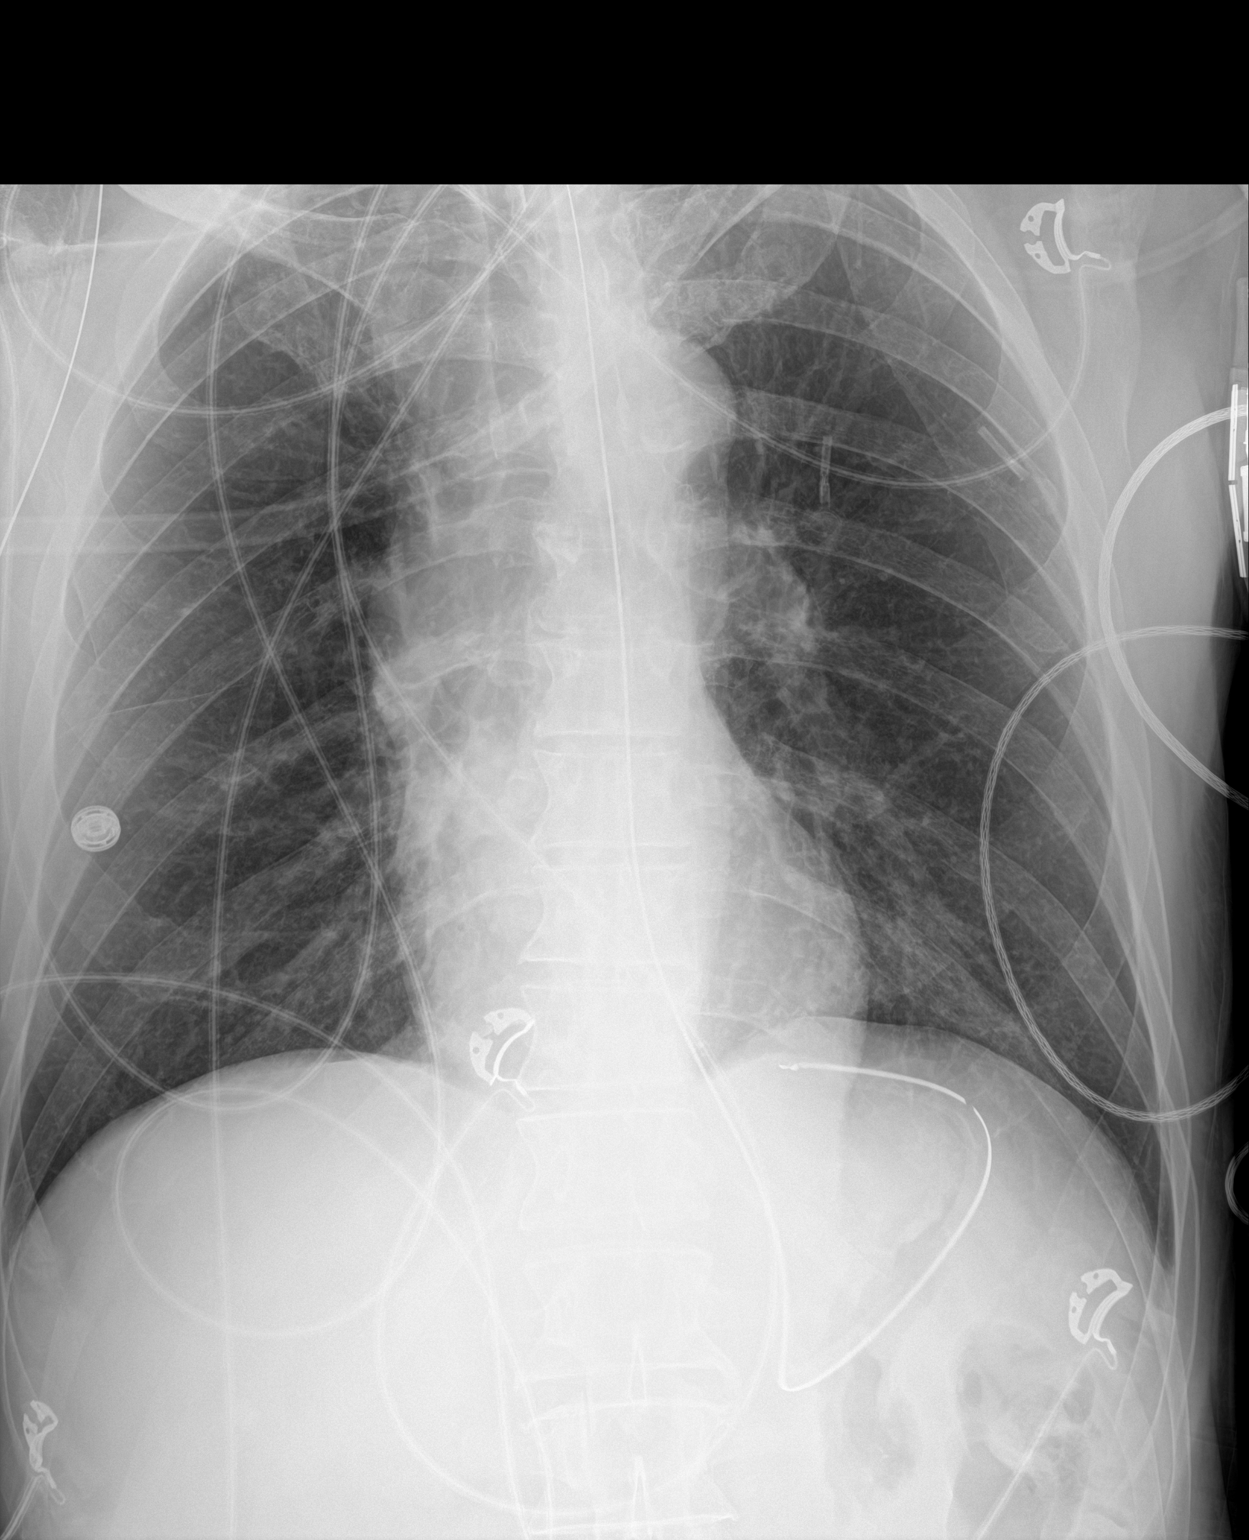

[2 of 2 positions shown; findings below may reference images not displayed]

FINDINGS: Endotracheal tube in satisfactory position. Orogastric tube tip and
side hole in the gastric cardia. Normal sized heart. Clear lungs
with normal vascularity. Mild thoracic spine degenerative changes.
IMPRESSION: 1. No acute abnormality.
2. Endotracheal and orogastric tubes in satisfactory position.

## 2019-10-12 MED ORDER — SODIUM CHLORIDE 0.9 % IV SOLN
2.0000 g | Freq: Four times a day (QID) | INTRAVENOUS | Status: DC
  Administered 2019-10-12 – 2019-10-14 (×8): 2 g via INTRAVENOUS
  Filled 2019-10-12: qty 2000
  Filled 2019-10-12: qty 2
  Filled 2019-10-12 (×2): qty 2000
  Filled 2019-10-12: qty 2
  Filled 2019-10-12 (×5): qty 2000

## 2019-10-12 MED ORDER — POTASSIUM CHLORIDE 10 MEQ/100ML IV SOLN
10.0000 meq | INTRAVENOUS | Status: AC
  Administered 2019-10-12 (×4): 10 meq via INTRAVENOUS
  Filled 2019-10-12 (×4): qty 100

## 2019-10-12 MED ORDER — SODIUM CHLORIDE 0.9 % IV SOLN
750.0000 mg | Freq: Two times a day (BID) | INTRAVENOUS | Status: DC
  Administered 2019-10-12 – 2019-10-16 (×8): 750 mg via INTRAVENOUS
  Filled 2019-10-12 (×10): qty 7.5

## 2019-10-12 MED ORDER — VITAL HIGH PROTEIN PO LIQD: 1000.0000 mL | ORAL | Status: DC

## 2019-10-12 MED ORDER — PROSOURCE TF PO LIQD
45.0000 mL | Freq: Two times a day (BID) | ORAL | Status: DC
Start: 2019-10-12 — End: 2019-10-12

## 2019-10-12 MED ORDER — VITAL AF 1.2 CAL PO LIQD
1000.0000 mL | ORAL | Status: DC
  Administered 2019-10-12 – 2019-10-13 (×2): 1000 mL
  Filled 2019-10-12 (×4): qty 1000

## 2019-10-12 NOTE — Progress Notes (Addendum)
NAME:  Corey Bennett, MRN:  [id number], DOB:  01-23-1961, LOS: 1 ADMISSION DATE:  10/10/2019, CONSULTATION DATE:  10/11/19 REFERRING MD:  EDP, CHIEF COMPLAINT:  seizure   Brief History   Corey Bennett is a 59 year old male with past medical history of 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  Patient is living in a assisted living facility and can ambulate by himself.  Patient was found by staff to have a right sided twitching with right eye deviation.  He was given Versed and Dilantin and was intubated for airway protection.   Past Medical History  Left MCA stroke with residual aphasia and right hemiparesis CEA Hypertension Hypercholesterolemia Diabetes  Significant Bennett Events   8/8 admit to PCCM  Consults:  Neurology  Procedures:  8/8 ETT  Significant Diagnostic Tests:  8/8 CT head>>No acute intracranial abnormality.  Large area of encephalomalacia throughout the left MCA territory, consistent with remote infarct. 8/8 CXR>>lungs clear 8/8 UDS>>+ benzo (but given versed in ED); o/w negative 8/8 MRI of brain >> large territory chronic hemorrhagic infarct in the left frontal lobe.  Ill-defined hyperintensity throughout the left basal ganglia and left thalamus with mild enhancement in dobutamine likely secondary to encephalitis, infiltrating tumor or atypical subacute infarction 8/8 MRI of face and trigeminal region >> rules out soft tissue abscess  Micro Data:  8/8 Sars-CoV-2>>negative 8/8 BCx2>> 8/8 UC>> negative for leukocyte esterase and nitrites, with moderate hemoglobin and ketones  Antimicrobials:  Vancomycin 8/8>> Ceftriaxone 8/8>> Ampicillin 8/8>>  Interim history/subjective:  Patient is seen at bedside with his brother, Corey Bennett.  Patient is sedated.  His brother is concerned if the lumbar puncture will be too invasive for the patient.  We will get in  touch with neurology.  Objective   Blood pressure (!) 173/75, pulse 89, temperature 98.5 F (36.9 C), temperature source Axillary, resp. rate 18, height 6\' 1"  (1.854 m), weight 71.4 kg, SpO2 100 %.    Vent Mode: PRVC FiO2 (%):  [40 %] 40 % Set Rate:  [18 bmp] 18 bmp Vt Set:  [620 mL] 620 mL PEEP:  [5 cmH20] 5 cmH20 Plateau Pressure:  [14 cmH20-15 cmH20] 14 cmH20   Intake/Output Summary (Last 24 hours) at 10/12/2019 0836 Last data filed at 10/12/2019 0600 Gross per 24 hour  Intake 1648.12 ml  Output 900 ml  Net 748.12 ml   Filed Weights   10/10/19 2300 10/11/19 0324 10/12/19 0500  Weight: 68.7 kg 71.3 kg 71.4 kg    Physical Exam Constitutional:      General: He is not in acute distress.    Appearance: He is not toxic-appearing.     Comments: Sedated  HENT:     Head: Normocephalic.  Eyes:     General: No scleral icterus.       Right eye: No discharge.        Left eye: No discharge.     Conjunctiva/sclera: Conjunctivae normal.     Pupils: Pupils are equal, round, and reactive to light.  Cardiovascular:     Rate and Rhythm: Normal rate and regular rhythm.     Heart sounds: No murmur heard.   Pulmonary:     Effort: Pulmonary effort is normal. No respiratory distress.     Breath sounds: Normal breath sounds.  Musculoskeletal:     Right lower leg: No edema.     Left lower leg: No edema.  Skin:    General:  Skin is warm.     Coloration: Skin is not jaundiced.  Neurological:     Comments: Corey Bennett     Resolved Bennett Problem list     Assessment & Plan:  Status epilepticus in the setting of recent large left MCA stroke Mechanical ventilation for airway protection Acute kidney injury Hypertension Diabetes  Plan:  Status epilepticus in the setting of recent large left MCA stroke EEG shows evidence of epileptogenic city in the left frontal temporal region likely secondary to underlying encephalomalacia and post ictal state.  No seizure was observed.  Patient may  require an LP to evaluate for underlying cause of seizure, however was held due to recent dose of Plavix.  Neurology is on board with the treatment.  MRI of brain shows chronic hemorraghic infract with an ill defined hyperintensity throughout the left basal ganglia and left thalamus concerning for encephalitis, infiltrating tumor or atypical subacute infarction.   -Appreciate neurology recommendation -Continue vancomycin, ceftriaxone, ampicillin for empirical coverage meningitis -Continue EEG monitoring -Continue frequent neurochecks -Continue phenytoin and Keppra per neurology  -Continue aspirin -Holding Plavix in case LP is needed   Mechanical ventilation for airway protection PRVC, rate 18, tidal volume 622, PEEP of 5, FiO2 40 -Sedation with propofol.  We will try to wean off propofol because patient is seizure-free -Try to wean off ventilator as tolerated   Recent dental infection MRI of the face shows no abscess.  Blood culture shows no growth on day 2.  Leukocytosis trending down from 19-15.  Patient has a fever of 101 and 100.6 this morning.  No clear source of infection.  May attempt LP per neurology to rule out meningitis. -Continue empiric antibiotics for meningitis -Continue to trend fever and WBC   AKI Hypokalemia Baseline creatinine of 1.23.  Current and trending up to 1.89.  FeNa of 0.6% which indicates prerenal. Patient makes good urine output yesterday of 900 cc.  Bladder scan show about 900 cc so Foley cath was inserted.  Recheck BMP tomorrow for improvement of creatinine. -Continue to trend creatinine -Replete potassium   Hypertension -Continue amlodipine 10 mg -Continue losartan 25 mg daily -Labetalol as needed for systolic pressure more than 180   Diabetes Last A1c of 7.2 -Bennett CBG -Sliding scale insulin  Best practice:  Diet: NPO Pain/Anxiety/Delirium protocol (if indicated): Propofol, Fentanyl VAP protocol (if indicated): HOB 30 degrees, suction  prn DVT prophylaxis: heparin GI prophylaxis: protonix Glucose control: SSI Mobility: bed rest Code Status: full code Family Communication: will update Disposition: ICU   Critical care time: 20 min     Doran Stabler, DO Internal Medicine Residency My pager: [phone]

## 2019-10-12 NOTE — Plan of Care (Signed)
  Problem: Safety: Goal: Ability to remain free from injury will improve Outcome: Progressing   

## 2019-10-12 NOTE — Progress Notes (Signed)
Initial Nutrition Assessment  DOCUMENTATION CODES:   Not applicable  INTERVENTION:   Initiate Vital 1.2 @ 65 ml/hr via OGT (1560 ml/day)  Tube feeding regimen provides 1872 kcal (90% of needs), 117 grams of protein, and 1265 ml of H2O.   TF regimen + propofol at current rate provides 2030 kcals (98% of estimated kcal needs)  NUTRITION DIAGNOSIS:   Inadequate oral intake related to inability to eat as evidenced by NPO status.  GOAL:   Patient will meet greater than or equal to 90% of their needs  MONITOR:   Vent status, Labs, Weight trends, TF tolerance, Skin, I & O's  REASON FOR ASSESSMENT:   Ventilator    ASSESSMENT:   59 y.o. 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  He was intubated for airway protection and status and PCCM consulted for admission  Pt admitted with status epilepticus in the setting of recent large L MCA CVA.   Patient is currently intubated on ventilator support. Pt with OGT; placement confirmed by x-ray. Currently clamped.  MV: 11 L/min Temp (24hrs), Avg:99.5 F (37.5 C), Min:98.4 F (36.9 C), Max:101.1 F (38.4 C)  Propofol: 6.2 ml/hr (providing 164 kcals)  No family at bedside to provide additional history.   Case discussed with RN, who reports pt remains on continuous EEG until propofol is weaned. Plan for lumbar puncture on Friday (10/16/19) to rule out meningitis. Also discussed with PCCM; received permission to start TF. RN is aware.  Medications reviewed and include colace, dilantin, and keppra.   Lab Results  Component Value Date   HGBA1C 7.2 (H) 09/04/2019   PTA DM medications are 12 units insulin glargine daily at bedtime, 5 units insulin lispro TID, and 100 mg metformin BID. Per ADA's Standards of Medical Care of Diabetes, glycemic targets for older adults who have multiple co-morbidities, cognitive impairments, and  functional dependence should be less stringent (Hgb A1c <8.0-8.5).   Labs reviewed: CBGS: 100-181 (inpatient orders for glycemic control are 0-15 units insulin aspart every 4 hours).   NUTRITION - FOCUSED PHYSICAL EXAM:    Most Recent Value  Orbital Region No depletion  Upper Arm Region No depletion  Thoracic and Lumbar Region No depletion  Buccal Region No depletion  Temple Region No depletion  Clavicle Bone Region No depletion  Clavicle and Acromion Bone Region No depletion  Scapular Bone Region No depletion  Dorsal Hand Unable to assess  Patellar Region No depletion  Anterior Thigh Region No depletion  Posterior Calf Region No depletion  Edema (RD Assessment) None  Hair Reviewed  Eyes Reviewed  Mouth Reviewed  Skin Reviewed  Nails Reviewed       Diet Order:   Diet Order            Diet NPO time specified  Diet effective now                 EDUCATION NEEDS:   No education needs have been identified at this time  Skin:  Skin Assessment: Reviewed RN Assessment  Last BM:  Unknown  Height:   Ht Readings from Last 1 Encounters:  10/10/19 6\' 1"  (1.854 m)    Weight:   Wt Readings from Last 1 Encounters:  10/12/19 71.4 kg    Ideal Body Weight:  83.6 kg  BMI:  Body mass index is 20.77 kg/m.  Estimated Nutritional Needs:   Kcal:  2070  Protein:  105-120 grams  Fluid:  > 2 L    Levada Schilling, RD, LDN, CDCES Registered Dietitian II Certified Diabetes Care and Education Specialist Please refer to [facility] for RD and/or RD on-call/weekend/after hours pager

## 2019-10-12 NOTE — Progress Notes (Addendum)
Patient's condom cath with 250cc output this AM. AM creat noted. Dr. Cyndie Chime at bedside and aware, orders for bladder scan given.  Bladder scan noted with 996 mL.  MD aware, Foley placed without incident. 700cc clear yellow urine noted upon insertion.

## 2019-10-12 NOTE — Progress Notes (Signed)
eLink Physician-Brief Progress Note Patient Name: Corey Bennett DOB: May 23, 1960 MRN: [id number]   Date of Service  10/12/2019  HPI/Events of Note  Agitation - Patient reaching for ETT and partially extubated themselves. Request for L soft wrist restraint.   eICU Interventions  Plan: 1. L soft wrist restraint X 10 hours.  2. ETT remains in satisfactory position.      Intervention Category Major Interventions: Delirium, psychosis, severe agitation - evaluation and management  Lyndell Gillyard Eugene 10/12/2019, 10:06 PM

## 2019-10-12 NOTE — Progress Notes (Signed)
Pharmacy Antibiotic Note  Corey Bennett is a 59 y.o. male admitted on 10/10/2019 with seizure.  Pharmacy has been consulted for vancomycin dosing for r/o meningitis; also on ampicillin + ceftriaxone.   The patient's SCr continues to rise and is up to 1.89 << 1.63 (BL 1.2-1.5?), CrCl~40-45 ml/min. Will adjust Ampicillin dose today, continue current Vanc dose for now - will adjust if SCr bumps further   Plan: - Continue Vancomycin 750 mg IV every 12 hours, goal trough 15-20 mcg/ml - Adjust Ampicillin to 2g IV every 6 hours - Will continue to follow renal function, culture results, LOT, and antibiotic de-escalation plans   Height: 6\' 1"  (185.4 cm) Weight: 71.4 kg (157 lb 6.5 oz) IBW/kg (Calculated) : 79.9  Temp (24hrs), Avg:99.5 F (37.5 C), Min:98.4 F (36.9 C), Max:101.1 F (38.4 C)  Recent Labs  Lab 10/10/19 2248 10/10/19 2251 10/11/19 0327 10/11/19 1151 10/12/19 0921  WBC 19.6*  --  15.1* 15.5*  --   CREATININE 1.67* 1.50* 1.63*  --  1.89*    Estimated Creatinine Clearance: 43 mL/min (A) (by C-G formula based on SCr of 1.89 mg/dL (H)).    No Known Allergies  Antimicrobials this admission: Vanc 8/8>> Ampicillin 8/8>> Ceftriaxone 8/8>>  Dose adjustments this admission:   Microbiology results: 8/8 BCx: ngtd 8/8 UCx: 20k K PNA 8/8 MRSA PCR: neg  Thank you for allowing pharmacy to be a part of this patients care.  10/8, PharmD, BCPS Clinical Pharmacist Clinical phone for 10/12/2019: [phone] 10/12/2019 2:03 PM   **Pharmacist phone directory can now be found on [email or web address] ([email or web address]).  Listed under [facility].

## 2019-10-12 NOTE — Progress Notes (Signed)
Pt very agitated, pulling at tubes. Grabbed OGT and partially self-extubated. Did not lose tidal volumes, no desaturations. RT able to advance tube to prior position. Chest/abd xrays ordered to confirm placement of ETT and OGT. Fentanyl administered for sedation, elink notified and wrist restraints requested.

## 2019-10-12 NOTE — Progress Notes (Addendum)
Subjective: No seizures overnight.  Was febrile to 101.1 Fahrenheit patient's brother [name] at bedside.  ROS: Unable to obtain due to poor mental status  Examination  Vital signs in last 24 hours: Temp:  [98.4 F (36.9 C)-101.1 F (38.4 C)] 100.6 F (38.1 C) (08/09 1200) Pulse Rate:  [80-92] 88 (08/09 1115) Resp:  [18-19] 18 (08/09 1115) BP: (93-173)/(60-81) 144/70 (08/09 1115) SpO2:  [100 %] 100 % (08/09 1115) FiO2 (%):  [40 %] 40 % (08/09 1115) Weight:  [71.4 kg] 71.4 kg (08/09 0500)  General: lying in bed, not in apparent distress CVS: pulse-normal rate and rhythm RS: breathing comfortably, intubated Extremities: normal, warm Neuro: Comatose, does not open eyes to noxious stimuli, does not follow commands, pupils equally round and reactive, corneal reflex intact, gag reflex intact, withdraws to all 4 extremities to noxious stimuli  Basic Metabolic Panel: Recent Labs  Lab 10/10/19 2248 10/10/19 2248 10/10/19 2251 10/11/19 0040 10/11/19 0327 10/11/19 0554 10/12/19 0921  NA 135   < > 138 135 135 137 138  K 3.6   < > 3.6 3.4* 4.3 3.5 3.2*  CL 97*  --  99  --  101  --  102  CO2 21*  --   --   --  17*  --  20*  GLUCOSE 306*  --  304*  --  292*  --  147*  BUN 30*  --  30*  --  29*  --  28*  CREATININE 1.67*  --  1.50*  --  1.63*  --  1.89*  CALCIUM 9.5  --   --   --  9.1  --  8.6*  MG  --   --   --   --  1.7  --   --   PHOS  --   --   --   --  3.4  --   --    < > = values in this interval not displayed.    CBC: Recent Labs  Lab 10/10/19 2248 10/10/19 2248 10/10/19 2251 10/11/19 0040 10/11/19 0327 10/11/19 0554 10/11/19 1151  WBC 19.6*  --   --   --  15.1*  --  15.5*  NEUTROABS 16.7*  --   --   --   --   --  13.3*  HGB 11.7*   < > 12.6* 15.0 11.9* 10.5* 10.7*  HCT 34.5*   < > 37.0* 44.0 34.3* 31.0* 30.6*  MCV 85.0  --   --   --  84.3  --  85.0  PLT 329  --   --   --  317  --  293   < > = values in this interval not displayed.     Coagulation  Studies: Recent Labs    10/10/19 2248  LABPROT 13.5  INR 1.1    Imaging MRI brain with and without contrast 10/11/2019: Large territory chronic hemorrhagic infarct in the left frontal lobe. There is methemoglobin in the infarct. There is mild cortical enhancement which could be due a subacute extension of the infarct.  There is ill-defined hyperintensity throughout the left basal ganglia and left thalamus which is not typical for chronic infarct. In addition the left thalamus is not in the left MCA territory. There is mild enhancement in the putamen following contrast administration. This does not show restricted diffusion. Differential diagnosis includes encephalitis, infiltrating tumor,and atypical subacute infarction.        ASSESSMENT AND PLAN: 59 year old male with left MCA stroke  who presented with focal convulsive status epilepticus which improved after Keppra, Dilantin and propofol.  However patient has also been febrile, no clear source of infection so far.  Chronic left MCA infarct Status epilepticus (resolved) Fever Diabetes with hyperglycemia Hypokalemia AKI Hypocalcemia Leukocytosis Anemia -No further seizures on LTM EEG.  However continues to be febrile without clear source of infection.  Recommendations -Wean off propofol at 86mcg/hr as patient has been seizure-free in over 24 hours. -We will continue LTM EEG while we wean off propofol -Reduced Keppra to 750mg  BID ( renally dosed) and continue Dilantin at 100mg  TID - Can add vimpat if any further seizures -We will hold off Plavix with plan to perform lumbar puncture on Friday if patient continues to be febrile. -Given MRI appearance, low suspicion for meningitis.  However we will continue antibiotics empirically as patient continues to be febrile. -If no source of infection found, can consider ID consult -Explained plan in detail to patient's brother at bedside, patient's nurse as well as critical care  physician -Management of rest of comorbidities per primary team  CRITICAL CARE Performed by:   Total critical care time: 35 minutes  Critical care time was exclusive of separately billable procedures and treating other patients.  Critical care was necessary to treat or prevent imminent or life-threatening deterioration.  Critical care was time spent personally by me on the following activities: development of treatment plan with patient and/or surrogate as well as nursing, discussions with consultants, evaluation of patient's response to treatment, examination of patient, obtaining history from patient or surrogate, ordering and performing treatments and interventions, ordering and review of laboratory studies, ordering and review of radiographic studies, pulse oximetry and re-evaluation of patient's condition.    Thursday Epilepsy [facility] For questions after 5pm please refer to AMION to reach the Neurologist on call

## 2019-10-13 ENCOUNTER — Inpatient Hospital Stay (HOSPITAL_COMMUNITY): Payer: BC Managed Care – PPO

## 2019-10-13 ENCOUNTER — Ambulatory Visit: Payer: BC Managed Care – PPO

## 2019-10-13 DIAGNOSIS — R569 Unspecified convulsions: Secondary | ICD-10-CM

## 2019-10-13 LAB — URINE CULTURE: Culture: 20000 — AB

## 2019-10-13 LAB — BASIC METABOLIC PANEL
Anion gap: 14 (ref 5–15)
BUN: 27 mg/dL — ABNORMAL HIGH (ref 6–20)
CO2: 18 mmol/L — ABNORMAL LOW (ref 22–32)
Calcium: 8.4 mg/dL — ABNORMAL LOW (ref 8.9–10.3)
Chloride: 106 mmol/L (ref 98–111)
Creatinine, Ser: 1.85 mg/dL — ABNORMAL HIGH (ref 0.61–1.24)
GFR calc Af Amer: 46 mL/min — ABNORMAL LOW (ref >60)
GFR calc non Af Amer: 39 mL/min — ABNORMAL LOW (ref >60)
Glucose, Bld: 152 mg/dL — ABNORMAL HIGH (ref 70–99)
Potassium: 3.4 mmol/L — ABNORMAL LOW (ref 3.5–5.1)
Sodium: 138 mmol/L (ref 135–145)

## 2019-10-13 LAB — MAGNESIUM
Magnesium: 1.7 mg/dL (ref 1.7–2.4)
Magnesium: 2 mg/dL (ref 1.7–2.4)

## 2019-10-13 LAB — CBC
HCT: 27 % — ABNORMAL LOW (ref 39.0–52.0)
Hemoglobin: 9.1 g/dL — ABNORMAL LOW (ref 13.0–17.0)
MCH: 29.6 pg (ref 26.0–34.0)
MCHC: 33.7 g/dL (ref 30.0–36.0)
MCV: 87.9 fL (ref 80.0–100.0)
Platelets: 239 10*3/uL (ref 150–400)
RBC: 3.07 MIL/uL — ABNORMAL LOW (ref 4.22–5.81)
RDW: 13.9 % (ref 11.5–15.5)
WBC: 15.4 10*3/uL — ABNORMAL HIGH (ref 4.0–10.5)
nRBC: 0 % (ref 0.0–0.2)

## 2019-10-13 LAB — GLUCOSE, CAPILLARY
Glucose-Capillary: 108 mg/dL — ABNORMAL HIGH (ref 70–99)
Glucose-Capillary: 108 mg/dL — ABNORMAL HIGH (ref 70–99)
Glucose-Capillary: 197 mg/dL — ABNORMAL HIGH (ref 70–99)
Glucose-Capillary: 212 mg/dL — ABNORMAL HIGH (ref 70–99)
Glucose-Capillary: 372 mg/dL — ABNORMAL HIGH (ref 70–99)
Glucose-Capillary: 394 mg/dL — ABNORMAL HIGH (ref 70–99)
Glucose-Capillary: 424 mg/dL — ABNORMAL HIGH (ref 70–99)

## 2019-10-13 LAB — PHOSPHORUS
Phosphorus: 3 mg/dL (ref 2.5–4.6)
Phosphorus: 3 mg/dL (ref 2.5–4.6)

## 2019-10-13 IMAGING — US US RENAL
1 series · 14 of 25 positions shown · non-contrast
Comparison: None.

CLINICAL DATA: Elevated creatinine

EXAM:
RENAL / URINARY TRACT ULTRASOUND COMPLETE

[Series 1: us renal · 14 of 41 slices shown]
[im 1/41]
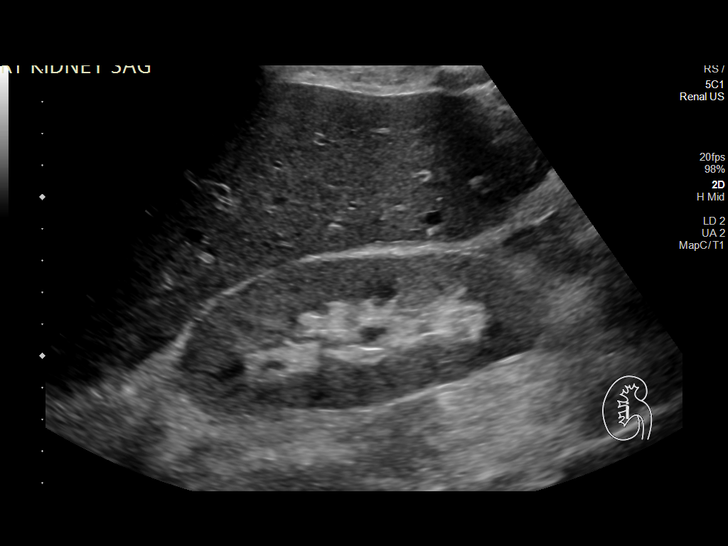
[im 4/41]
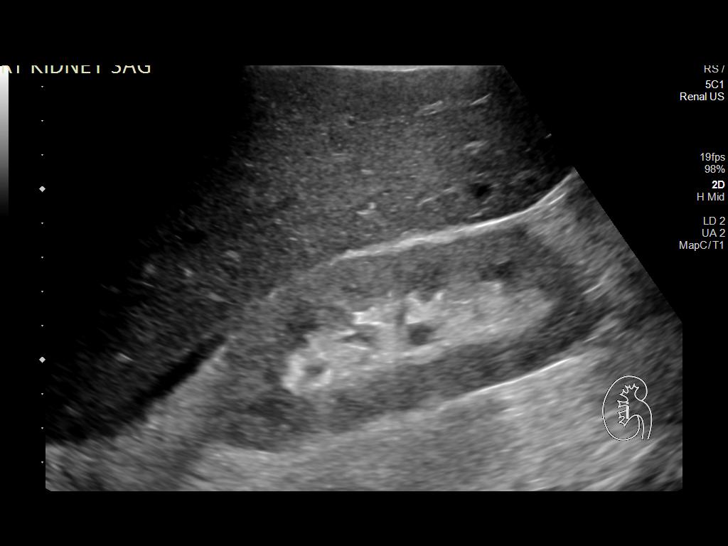
[im 7/41]
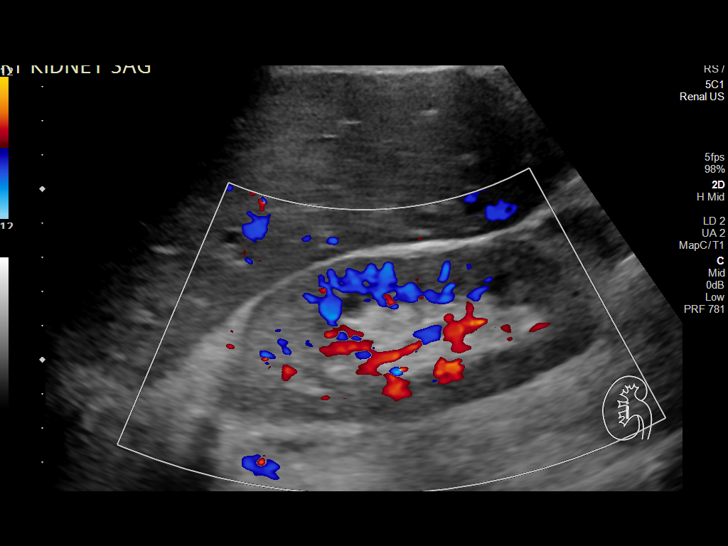
[im 11/41]
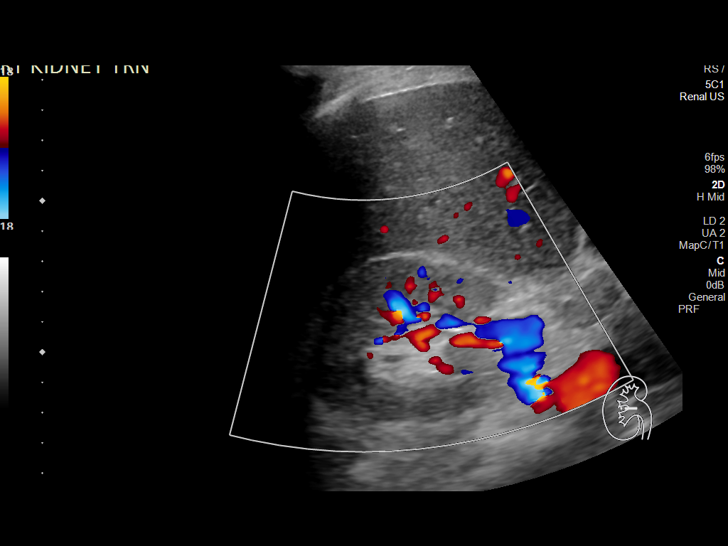
[im 14/41]
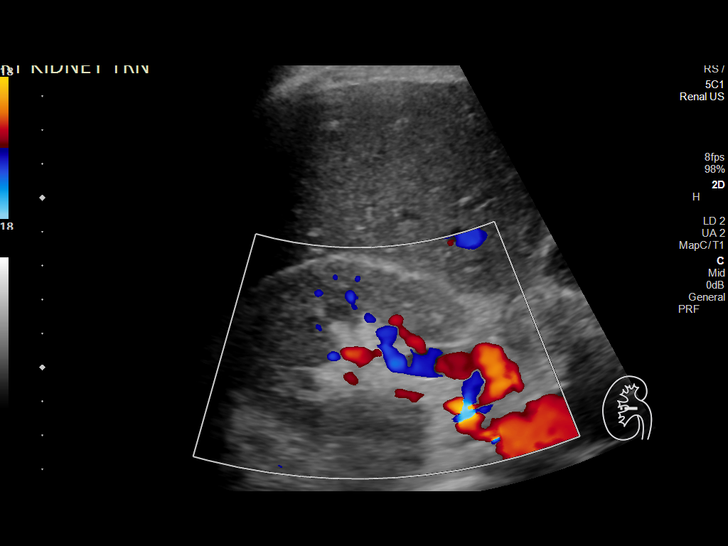
[im 16/41]
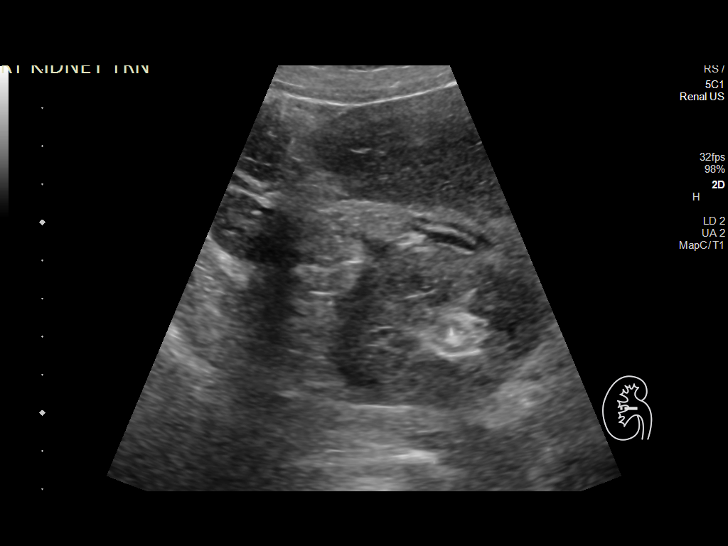
[im 19/41]
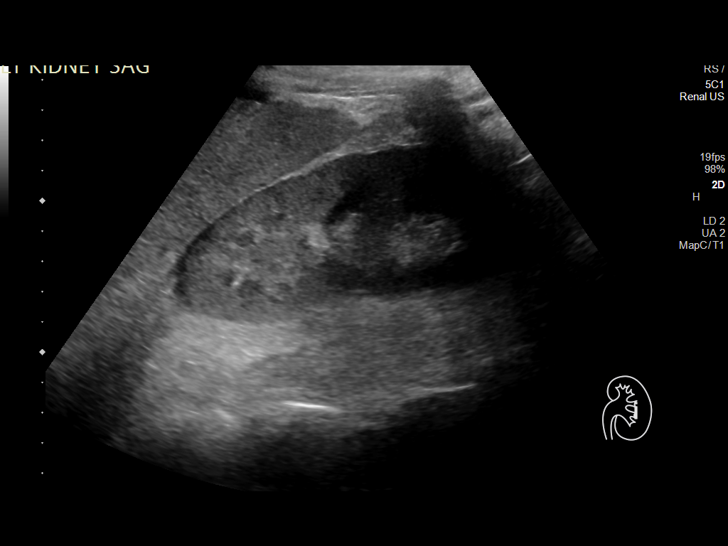
[im 22/41]
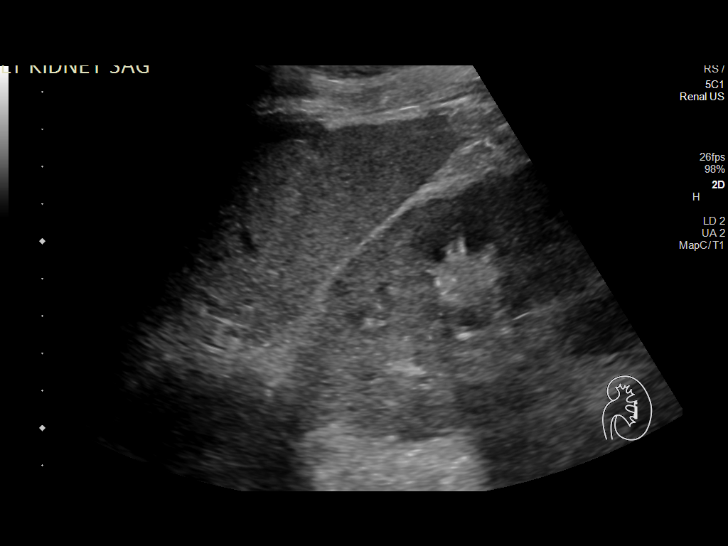
[im 26/41]
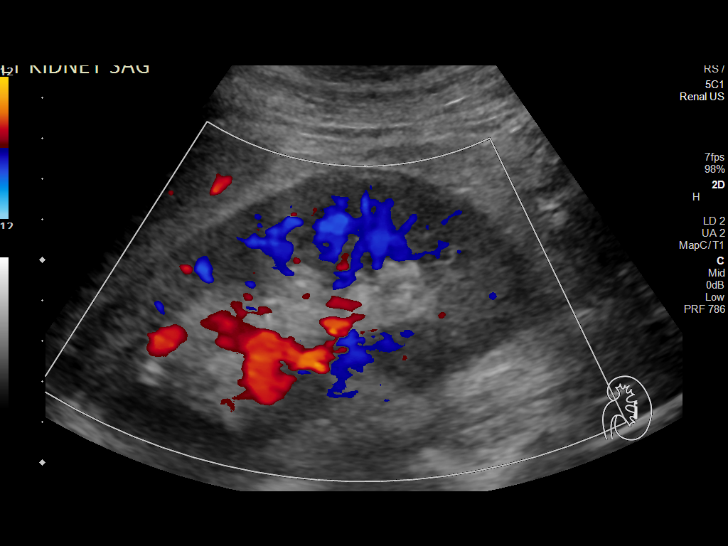
[im 27/41]
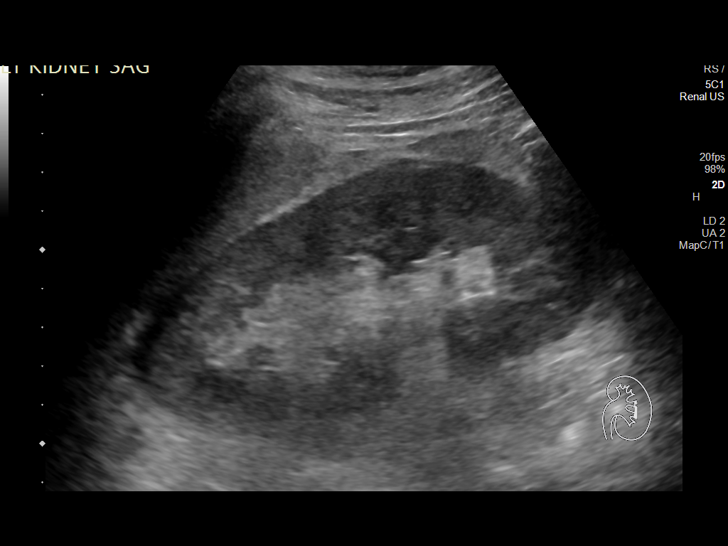
[im 31/41]
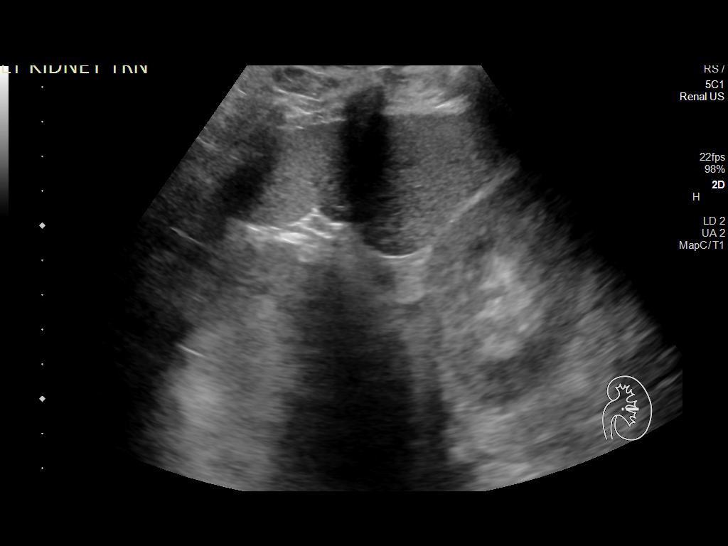
[im 34/41]
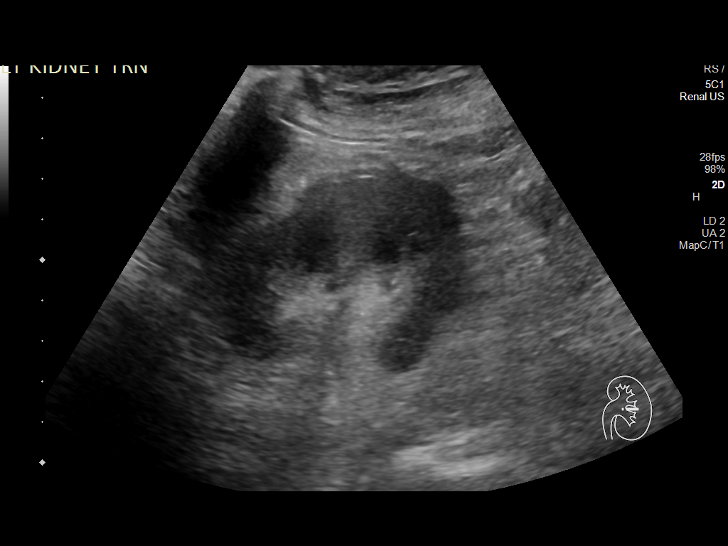
[im 37/41]
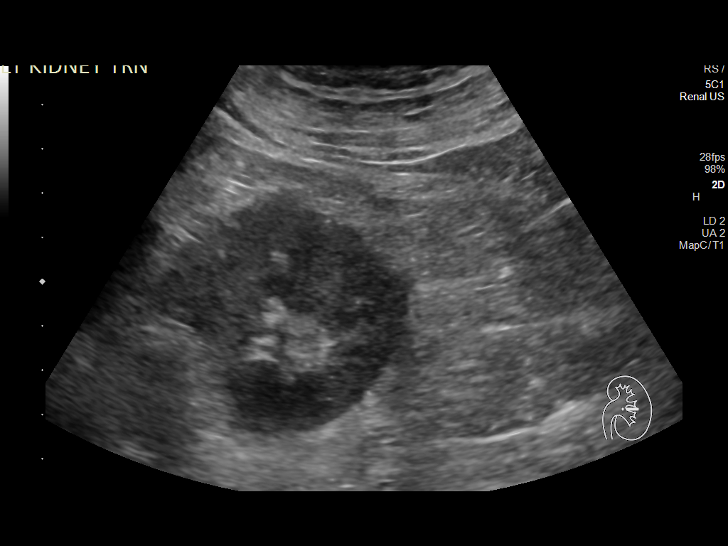
[im 41/41]
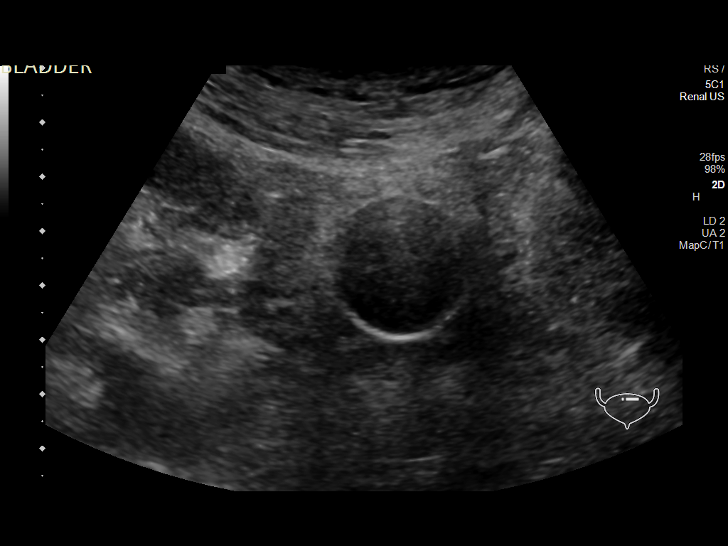

[14 of 25 positions shown; findings below may reference images not displayed]

FINDINGS: Right Kidney:

Renal measurements: 12.2 x 4.9 x 6.1 cm. = volume: 191 mL. No mass
lesion or hydronephrosis is noted. Mild perinephric fluid is noted.

Left Kidney:

Renal measurements: 11.6 x 6.8 x 5.2 cm. = volume: 213 mL. No mass
lesion or hydronephrosis is noted. Mild perinephric fluid is noted.

Bladder:

Decompressed

Other:

None.
IMPRESSION: Mild perinephric fluid.  No obstructive changes are noted.

## 2019-10-13 MED ORDER — LACTATED RINGERS IV SOLN: INTRAVENOUS | Status: AC

## 2019-10-13 MED ORDER — INSULIN GLARGINE 100 UNIT/ML ~~LOC~~ SOLN
12.0000 [IU] | Freq: Every day | SUBCUTANEOUS | Status: DC
  Filled 2019-10-13: qty 0.12

## 2019-10-13 MED ORDER — INSULIN GLARGINE 100 UNIT/ML ~~LOC~~ SOLN
10.0000 [IU] | Freq: Every day | SUBCUTANEOUS | Status: DC
  Administered 2019-10-13: 10 [IU] via SUBCUTANEOUS
  Filled 2019-10-13: qty 0.1

## 2019-10-13 MED ORDER — INSULIN ASPART 100 UNIT/ML ~~LOC~~ SOLN
5.0000 [IU] | Freq: Three times a day (TID) | SUBCUTANEOUS | Status: DC
  Administered 2019-10-13 – 2019-10-14 (×3): 5 [IU] via SUBCUTANEOUS

## 2019-10-13 MED ORDER — HYDRALAZINE HCL 20 MG/ML IJ SOLN
INTRAMUSCULAR | Status: AC
  Filled 2019-10-13: qty 1

## 2019-10-13 MED ORDER — METOPROLOL TARTRATE 5 MG/5ML IV SOLN
INTRAVENOUS | Status: AC
  Filled 2019-10-13: qty 5

## 2019-10-13 MED ORDER — POTASSIUM CHLORIDE 10 MEQ/100ML IV SOLN
10.0000 meq | INTRAVENOUS | Status: AC
  Administered 2019-10-13 (×4): 10 meq via INTRAVENOUS
  Filled 2019-10-13 (×4): qty 100

## 2019-10-13 NOTE — Procedures (Signed)
Patient Name:Corey Bennett YBO:[id number] Epilepsy Attending:Tashera Montalvo Annabelle Harman Referring Physician/Provider:Laura Gleason, PA Duration:10/12/2019 0730 to  10/13/2019 0730  Patient history:58 y.o.malewith past medical history significant for diabetes mellitus, hypertension, hyperlipidemia, large left MCA stroke with residual mild aphasia and right hemiparesis in April 2021, status post left CEAon 7/2021presents with focal status epilepticus characterized by right facial twitching and right upper extremity twitching along with right forced gaze deviation.EEG to evaluate for seizure  Level of alertness:comatose  AEDs during EEG study:LEV, PHT, propofol  Technical aspects: This EEG study was done with scalp electrodes positioned according to the 10-20 International system of electrode placement. Electrical activity was acquired at a sampling rate of 500Hz  and reviewed with a high frequency filter of 70Hz  and a low frequency filter of 1Hz . EEG data were recorded continuously and digitally stored.   Description: EEG showed continuous generalizedand lateralized3 to 6 Hz theta-delta slowingadmixed with15 to 18 Hzbeta activity with irregular morphology distributed symmetrically and diffusely.Sharp waves were also noted in left frontotemporal region.Hyperventilation and photic stimulation were not performed.   ABNORMALITY - Sharp waves, left frontotemporal region -Continuousslow, generalized and lateralized left hemisphere -Excessive beta, generalized  IMPRESSION: This studyshowed evidence of epileptogenicity in left frontotemporal region as well ascortical dysfunction in left hemisphere likely secondary to underlying encephalomalacia and post-ictal state. Additionally, there issevere diffuse encephalopathy, nonspecific etiology but likely related to sedation.No seizures were seen throughout the recording.  Corey Bennett 

## 2019-10-13 NOTE — Progress Notes (Signed)
Subjective: NAEO. On fentanyl for agitation. No further seizures. Continues to be febrile  ROS: Unable to obtain due to poor mental status  Examination  Vital signs in last 24 hours: Temp:  [97.8 F (36.6 C)-100.7 F (38.2 C)] 99 F (37.2 C) (08/10 0800) Pulse Rate:  [80-94] 80 (08/10 1133) Resp:  [18-25] 18 (08/10 1133) BP: (120-181)/(57-91) 127/63 (08/10 1100) SpO2:  [98 %-100 %] 100 % (08/10 1133) FiO2 (%):  [40 %] 40 % (08/10 1133)  General: lying in bed, not in apparent distress CVS: pulse-normal rate and rhythm RS: breathing comfortably, intubated Extremities: normal, warm Neuro: opens eyes to tactile stimuli,did move left arm and leg on command but does not follow commands consistently, pupils equally round and reactive, blinks to threat BL, antigravity strength in all extremities  Basic Metabolic Panel: Recent Labs  Lab 10/10/19 2248 10/10/19 2248 10/10/19 2251 10/10/19 2251 10/11/19 0040 10/11/19 0327 10/11/19 0554 10/12/19 0921 10/12/19 1425 10/12/19 1757 10/13/19 0412  NA 135   < > 138   < > 135 135 137 138  --   --  138  K 3.6   < > 3.6   < > 3.4* 4.3 3.5 3.2*  --   --  3.4*  CL 97*  --  99  --   --  101  --  102  --   --  106  CO2 21*  --   --   --   --  17*  --  20*  --   --  18*  GLUCOSE 306*  --  304*  --   --  292*  --  147*  --   --  152*  BUN 30*  --  30*  --   --  29*  --  28*  --   --  27*  CREATININE 1.67*  --  1.50*  --   --  1.63*  --  1.89*  --   --  1.85*  CALCIUM 9.5   < >  --   --   --  9.1  --  8.6*  --   --  8.4*  MG  --   --   --   --   --  1.7  --   --  1.9 1.8 1.7  PHOS  --   --   --   --   --  3.4  --   --  3.5 3.5 3.0   < > = values in this interval not displayed.    CBC: Recent Labs  Lab 10/10/19 2248 10/10/19 2251 10/11/19 0040 10/11/19 0327 10/11/19 0554 10/11/19 1151 10/13/19 0412  WBC 19.6*  --   --  15.1*  --  15.5* 15.4*  NEUTROABS 16.7*  --   --   --   --  13.3*  --   HGB 11.7*   < > 15.0 11.9* 10.5* 10.7* 9.1*   HCT 34.5*   < > 44.0 34.3* 31.0* 30.6* 27.0*  MCV 85.0  --   --  84.3  --  85.0 87.9  PLT 329  --   --  317  --  293 239   < > = values in this interval not displayed.     Coagulation Studies: Recent Labs    10/10/19 2248  LABPROT 13.5  INR 1.1    Imaging No new brain imaging  ASSESSMENT AND PLAN: 59 year old male with left MCA stroke who presented with focal convulsive status epilepticus which  improved after Keppra, Dilantin and propofol.  However patient has also been febrile, does have UTI.  Chronic left MCA infarct Status epilepticus (resolved) Fever -No further seizures on LTM EEG.    Recommendations -We will discontinue LTM EEG as patient has been seizure free -Continue Keppra to 750mg  BID ( renally dosed) and Dilantin at 100mg  TID. Will check trough level on friday - Can add vimpat if any further seizures -We will hold off Plavix with plan to perform lumbar puncture on Friday if patient continues to be febrile. -Given MRI appearance, low suspicion for meningitis.  However we will continue antibiotics empirically as patient continues to be febrile. -If no source of infection found, can consider ID consult -Management of rest of comorbidities per primary team  CRITICAL CARE Performed by:   Total critical care time: 35 minutes  Critical care time was exclusive of separately billable procedures and treating other patients.  Critical care was necessary to treat or prevent imminent or life-threatening deterioration.  Critical care was time spent personally by me on the following activities: development of treatment plan with patient and/or surrogate as well as nursing, discussions with consultants, evaluation of patient's response to treatment, examination of patient, obtaining history from patient or surrogate, ordering and performing treatments and interventions, ordering and review of laboratory studies, ordering and review of radiographic  studies, pulse oximetry and re-evaluation of patient's condition.  Tuesday Epilepsy [facility] For questions after 5pm please refer to AMION to reach the Neurologist on call

## 2019-10-13 NOTE — Progress Notes (Signed)
LTM EEG discontinued - no skin breakdown at unhook.   

## 2019-10-13 NOTE — Progress Notes (Signed)
NAME:  Javier Gell, MRN:  [id number], DOB:  [birth year], LOS: 2 ADMISSION DATE:10/10/2019, CONSULTATION DATE:10/11/19 REFERRING MD: EDP, CHIEF COMPLAINT:seizure  Brief History   Mazin monitor is a 59 year old male with past medical history of 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  Patient is living in a assisted living facility and can ambulate by himself.  Patient was found by staff to have a right sided twitching with right eye deviation.  He was given Versed and Dilantin and was intubated for airway protection.  Past Medical History  Left MCA stroke with residual aphasia and right hemiparesis CEA Hypertension Hypercholesterolemia Diabetes   Significant Hospital Events   8/8 admit to PCCM   Consults:  Neurology  Procedures:  8/8 ETT  Significant Diagnostic Tests:  8/8 CT head>>No acute intracranial abnormality. Large area of encephalomalacia throughout the left MCA territory, consistent with remote infarct. 8/8 CXR>>lungs clear 8/8 UDS>>+ benzo (but given versed in ED); o/w negative 8/8 MRI of brain >> large territory chronic hemorrhagic infarct in the left frontal lobe.  Ill-defined hyperintensity throughout the left basal ganglia and left thalamus with mild enhancement in dobutamine likely secondary to encephalitis, infiltrating tumor or atypical subacute infarction 8/8 MRI of face and trigeminal region >> rules out soft tissue abscess  Micro Data:  8/8 Sars-CoV-2>>negative 8/8 BCx2>> 8/8 UC>>  Klebsiella pneumonia  Antimicrobials:  Vancomycin 8/8>> Ceftriaxone 8/8>> Ampicillin 8/8>>  Interim history/subjective:  Patient is seen at bedside.  He appears comfortable and in no acute distress.  Patient is able to open his eyes but cannot follow commands.  Objective   Blood pressure 120/61, pulse 83, temperature 99 F (37.2 C), temperature source  Axillary, resp. rate 18, height 6\' 1"  (1.854 m), weight 71.4 kg, SpO2 100 %.    Vent Mode: PRVC FiO2 (%):  [40 %] 40 % Set Rate:  [18 bmp] 18 bmp Vt Set:  [620 mL] 620 mL PEEP:  [5 cmH20] 5 cmH20 Plateau Pressure:  [12 cmH20-15 cmH20] 14 cmH20   Intake/Output Summary (Last 24 hours) at 10/13/2019 0907 Last data filed at 10/13/2019 0800 Gross per 24 hour  Intake 2690.3 ml  Output 1950 ml  Net 740.3 ml   Filed Weights   10/10/19 2300 10/11/19 0324 10/12/19 0500  Weight: 68.7 kg 71.3 kg 71.4 kg    Physical Exam Constitutional:      General: He is not in acute distress.    Appearance: He is not toxic-appearing.     Comments: Sedated, opening eyes but cannot follow commands HENT:     Head: Normocephalic.  Eyes: PERRLA    General: No scleral icterus.       Right eye: No discharge.        Left eye: No discharge.     Conjunctiva/sclera: Conjunctivae normal.     Pupils: Pupils are equal, round, and reactive to light.  Cardiovascular:     Rate and Rhythm: Normal rate and regular rhythm.     Heart sounds: No murmur heard.  Pulmonary:     Effort: Pulmonary effort is normal. No respiratory distress.     Breath sounds: Normal breath sounds.  Musculoskeletal:     Right lower leg: No edema.     Left lower leg: No edema.  Skin:    General: Skin is warm.     Coloration: Skin is not jaundiced.  Neurological:     Comments: PERRLA .  Still not be  able to follow command   Resolved Hospital Problem list     Assessment & Plan:  Status epilepticus in the setting of recent large left MCA stroke Mechanical ventilation for airway protection Acute kidney injury Urinary tract infection with Klebsiella pneumoniae Hypertension Diabetes  Plan:  Status epilepticus in the setting of recent large left MCA stroke EEG shows evidence of epileptogenic city in the left frontal temporal region likely secondary to underlying encephalomalacia and post ictal state.  No seizure was observed.  MRI  of brain shows chronic hemorraghic infract with an ill defined hyperintensity throughout the left basal ganglia and left thalamus concerning for encephalitis, infiltrating tumor or atypical subacute infarction.    Patient had a spike of fever of 100.6 early this morning, current temperature is 99.  We will continue to monitor for spikes of fever, if not improve we will attempt OP this Friday per neurology.  Plavix was held. -Appreciate neurology recommendation -Continue vancomycin, ceftriaxone, ampicillin for empirical coverage meningitis -Continue EEG monitoring -Continue frequent neurochecks -Continue phenytoin and Keppra per neurology  -Continue aspirin -Holding Plavix in case LP is needed if patient continues to be febrile   Mechanical ventilation for airway protection PRVC, rate 18, tidal volume 620, PEEP of 5, FiO2 40.  Patient is off of propofol but still not able to follow command.  His RSBI score of 29 indicates likely successful extubation.  Will attempt SBT when he is more alert and able to follow command. -Attempt SBT as tolerated   AKI Hypokalemia Baseline creatinine of 1.23.   FeNa of 0.6% which indicates prerenal.  Creatinine not improved despite large volume of urine collected from catheter.  Will check a renal ultrasound for any obstruction.  Thinking this could be secondary to vancomycin.  Continue Foley and monitor BMP daily -Pending renal ultrasound -Continue to trend creatinine -Replete potassium   Recent dental infection MRI of the face shows no abscess.  Blood culture shows no growth on day 2.  Leukocytosis trending down from 19-15.  Patient has a fever of 101 and 100.6 this morning.  No clear source of infection.  May attempt LP per neurology to rule out meningitis. -Continue empiric antibiotics for meningitis -Continue to trend fever and WBC   UTI Urine culture shows more than 20,000 colonies of Klebsiella pneumoniae which is sensitive to ceftriaxone.  We  will continue the empiric antibiotics with Comycin, ceftriaxone and ampicillin -Continue to trend for fever and WBC   Hypertension -Continue amlodipine 10 mg -Continue losartan 25 mg daily -Labetalol as needed for systolic pressure more than 180   Diabetes Refeeding syndrome Last A1c of 7.2.  Started on tube feed yesterday.  We will add 10 units of Lantus daily and continue sliding scale.  Monitor magnesium and phosphorus for refeeding syndrome.  And replete as needed -Monitor CBG -10 units Lantus daily -Sliding scale insulin  Best practice:  Diet: Tube feed Pain/Anxiety/Delirium protocol (if indicated): Propofol, Fentanyl VAP protocol (if indicated): HOB 30 degrees, suction prn DVT prophylaxis: heparin GI prophylaxis: protonix Glucose control: SSI, Lantus Mobility: bed rest Code Status: full code Family Communication:will update Disposition: ICU  Critical care time:20 min     Doran Stabler, DO Internal Medicine Residency My pager: [phone]

## 2019-10-13 NOTE — Procedures (Signed)
Patient Name:Corey Bennett FBP:[id number] Epilepsy Attending:Spruha Weight Annabelle Harman Referring Physician/Provider:Laura Gleason, PA Duration:10/13/2019 0730 to 10/13/2019 1044  Patient history:58 y.o.malewith past medical history significant for diabetes mellitus, hypertension, hyperlipidemia, large left MCA stroke with residual mild aphasia and right hemiparesis in April 2021, status post left CEAon 7/2021presents with focal status epilepticus characterized by right facial twitching and right upper extremity twitching along with right forced gaze deviation.EEG to evaluate for seizure  Level of alertness:comatose  AEDs during EEG study:LEV,PHT, propofol  Technical aspects: This EEG study was done with scalp electrodes positioned according to the 10-20 International system of electrode placement. Electrical activity was acquired at a sampling rate of 500Hz  and reviewed with a high frequency filter of 70Hz  and a low frequency filter of 1Hz . EEG data were recorded continuously and digitally stored.   Description: EEG showed continuous generalizedand lateralized3 to 6 Hz theta-delta slowingadmixed with15 to 18 Hzbeta activity with irregular morphology distributed symmetrically and diffusely.Hyperventilation and photic stimulation were not performed.   ABNORMALITY -Continuousslow, generalized and lateralized left hemisphere -Excessive beta, generalized  IMPRESSION: This studyis suggestive ofcortical dysfunction in left hemisphere likely secondary to underlying encephalomalacia and post-ictal state. Additionally, there issevere diffuse encephalopathy, nonspecific etiology but likely related to sedation.No seizures were seen throughout the recording.  Corey Bennett 

## 2019-10-14 DIAGNOSIS — G039 Meningitis, unspecified: Secondary | ICD-10-CM

## 2019-10-14 DIAGNOSIS — N179 Acute kidney failure, unspecified: Secondary | ICD-10-CM

## 2019-10-14 DIAGNOSIS — J9601 Acute respiratory failure with hypoxia: Secondary | ICD-10-CM

## 2019-10-14 LAB — CBC
HCT: 27.7 % — ABNORMAL LOW (ref 39.0–52.0)
Hemoglobin: 9.3 g/dL — ABNORMAL LOW (ref 13.0–17.0)
MCH: 29.4 pg (ref 26.0–34.0)
MCHC: 33.6 g/dL (ref 30.0–36.0)
MCV: 87.7 fL (ref 80.0–100.0)
Platelets: 267 10*3/uL (ref 150–400)
RBC: 3.16 MIL/uL — ABNORMAL LOW (ref 4.22–5.81)
RDW: 14.1 % (ref 11.5–15.5)
WBC: 16.6 10*3/uL — ABNORMAL HIGH (ref 4.0–10.5)
nRBC: 0 % (ref 0.0–0.2)

## 2019-10-14 LAB — BASIC METABOLIC PANEL
Anion gap: 12 (ref 5–15)
BUN: 29 mg/dL — ABNORMAL HIGH (ref 6–20)
CO2: 20 mmol/L — ABNORMAL LOW (ref 22–32)
Calcium: 8.4 mg/dL — ABNORMAL LOW (ref 8.9–10.3)
Chloride: 106 mmol/L (ref 98–111)
Creatinine, Ser: 1.7 mg/dL — ABNORMAL HIGH (ref 0.61–1.24)
GFR calc Af Amer: 50 mL/min — ABNORMAL LOW (ref >60)
GFR calc non Af Amer: 43 mL/min — ABNORMAL LOW (ref >60)
Glucose, Bld: 206 mg/dL — ABNORMAL HIGH (ref 70–99)
Potassium: 3.4 mmol/L — ABNORMAL LOW (ref 3.5–5.1)
Sodium: 138 mmol/L (ref 135–145)

## 2019-10-14 LAB — GLUCOSE, CAPILLARY
Glucose-Capillary: 126 mg/dL — ABNORMAL HIGH (ref 70–99)
Glucose-Capillary: 138 mg/dL — ABNORMAL HIGH (ref 70–99)
Glucose-Capillary: 140 mg/dL — ABNORMAL HIGH (ref 70–99)
Glucose-Capillary: 142 mg/dL — ABNORMAL HIGH (ref 70–99)
Glucose-Capillary: 202 mg/dL — ABNORMAL HIGH (ref 70–99)
Glucose-Capillary: 267 mg/dL — ABNORMAL HIGH (ref 70–99)

## 2019-10-14 LAB — PHENYTOIN LEVEL, TOTAL: Phenytoin Lvl: 14.1 ug/mL (ref 10.0–20.0)

## 2019-10-14 LAB — VANCOMYCIN, TROUGH: Vancomycin Tr: 26 ug/mL (ref 15–20)

## 2019-10-14 LAB — TRIGLYCERIDES: Triglycerides: 71 mg/dL (ref <150)

## 2019-10-14 MED ORDER — CLOBAZAM 10 MG PO TABS
5.0000 mg | ORAL_TABLET | Freq: Every day | ORAL | Status: DC
  Administered 2019-10-14: 5 mg via ORAL

## 2019-10-14 MED ORDER — VANCOMYCIN HCL 500 MG/100ML IV SOLN
500.0000 mg | Freq: Two times a day (BID) | INTRAVENOUS | Status: DC
  Administered 2019-10-14 – 2019-10-15 (×4): 500 mg via INTRAVENOUS
  Filled 2019-10-14 (×7): qty 100

## 2019-10-14 MED ORDER — RACEPINEPHRINE HCL 2.25 % IN NEBU
INHALATION_SOLUTION | RESPIRATORY_TRACT | Status: AC
  Filled 2019-10-14: qty 0.5

## 2019-10-14 MED ORDER — CLOBAZAM 10 MG PO TABS: 5.0000 mg | ORAL_TABLET | Freq: Every day | ORAL | Status: DC

## 2019-10-14 MED ORDER — INSULIN GLARGINE 100 UNIT/ML ~~LOC~~ SOLN
15.0000 [IU] | Freq: Every day | SUBCUTANEOUS | Status: DC
  Administered 2019-10-14: 15 [IU] via SUBCUTANEOUS
  Filled 2019-10-14 (×2): qty 0.15

## 2019-10-14 MED ORDER — TAMSULOSIN HCL 0.4 MG PO CAPS: 0.4000 mg | ORAL_CAPSULE | Freq: Every day | ORAL | Status: DC

## 2019-10-14 MED ORDER — POTASSIUM CHLORIDE 10 MEQ/100ML IV SOLN
10.0000 meq | INTRAVENOUS | Status: AC
  Administered 2019-10-14 (×4): 10 meq via INTRAVENOUS
  Filled 2019-10-14 (×4): qty 100

## 2019-10-14 MED ORDER — CLOBAZAM 10 MG PO TABS
5.0000 mg | ORAL_TABLET | Freq: Every day | ORAL | Status: DC
  Filled 2019-10-14: qty 1

## 2019-10-14 NOTE — Procedures (Signed)
Extubation Procedure Note  Patient Details:   Name: Corey Bennett DOB: 03-27-1960 MRN: [id number]   Airway Documentation:    Vent end date: 10/14/19 Vent end time: 1100   Evaluation  O2 sats: stable throughout Complications: No apparent complications Patient did tolerate procedure well. Bilateral Breath Sounds: Clear, Diminished (upper airway secretions/slight stridor)   Yes   RT extubated patient to 4L [place] per MD order with RN at bedside. Positive cuff leak noted. Patient seemed to have upper airway secretions/slight stridor after extubation. Racemic epi given per MD order. Patient tolerated well. Patient is not in any distress and seems comfortable on [place]. RT will continue to monitor as needed.   Jaquelyn Bitter 10/14/2019, 11:27 AM

## 2019-10-14 NOTE — Progress Notes (Signed)
Pharmacy Antibiotic Note  Corey Bennett is a 59 y.o. male admitted on 10/10/2019 with status epilepticus and concern for meningitis.  Pharmacy has been consulted for vancomycin dosing.  Vanc trough 26 (drawn one our prior to true trough, which calculates closer to 24), slightly above goal.  Plan: Change vancomycin to 500mg  IV every 12 hours.  Goal trough 15-20 mcg/mL.  Calculated trough ~16.  Height: 6\' 1"  (185.4 cm) Weight: 71.4 kg (157 lb 6.5 oz) IBW/kg (Calculated) : 79.9  Temp (24hrs), Avg:99.3 F (37.4 C), Min:98.5 F (36.9 C), Max:100.7 F (38.2 C)  Recent Labs  Lab 10/10/19 2248 10/10/19 2251 10/11/19 0327 10/11/19 1151 10/12/19 0921 10/13/19 0412 10/13/19 2305  WBC 19.6*  --  15.1* 15.5*  --  15.4*  --   CREATININE 1.67* 1.50* 1.63*  --  1.89* 1.85*  --   VANCOTROUGH  --   --   --   --   --   --  26*    Estimated Creatinine Clearance: 44 mL/min (A) (by C-G formula based on SCr of 1.85 mg/dL (H)).    No Known Allergies   Thank you for allowing pharmacy to be a part of this patient's care.  12/13/19, PharmD, BCPS  10/14/2019 12:59 AM

## 2019-10-14 NOTE — Progress Notes (Signed)
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  Patient is living in a assisted living facility and can ambulate by himself.  Patient was found by staff to have a right sided twitching with right eye deviation.  He was given Versed and Dilantin and was intubated for airway protection.  Past Medical History  Left MCA stroke with residual aphasia and right hemiparesis CEA Hypertension Hypercholesterolemia Diabetes   Significant Hospital Events   8/8 admit to PCCM  Consults:  Neurology  Procedures:  8/8 ETT  Significant Diagnostic Tests:  8/8 CT head>>No acute intracranial abnormality. Large area of encephalomalacia throughout the left MCA territory, consistent with remote infarct. 8/8 CXR>>lungs clear 8/8 UDS>>+ benzo (but given versed in ED); o/w negative 8/8 MRI of brain >> large territory chronic hemorrhagic infarct in the left frontal lobe.  Ill-defined hyperintensity throughout the left basal ganglia and left thalamus with mild enhancement in dobutamine likely secondary to encephalitis, infiltrating tumor or atypical subacute infarction 8/8 MRI of face and trigeminal region >> rules out soft tissue abscess  Micro Data:  8/8 Sars-CoV-2>>negative 8/8 BCx2>> 8/8 UC>>  Klebsiella pneumonia  Antimicrobials:  Vancomycin 8/8>> Ceftriaxone 8/8>> Ampicillin 8/8>>  Interim history/subjective:  Patient is seen at bedside today.  He is awake and Corey to give sign that he is not in any distress.  He is Corey to follow commands such as moving arm and leg but cannot lift his head up.  Objective   Blood pressure (!) 142/67, pulse 81,  temperature 98.5 F (36.9 C), temperature source Axillary, resp. rate 17, height 6\' 1"  (1.854 m), weight 71.4 kg, SpO2 100 %.    Vent Mode: CPAP;PSV FiO2 (%):  [40 %] 40 % Set Rate:  [18 bmp] 18 bmp Vt Set:  [620 mL] 620 mL PEEP:  [5 cmH20] 5 cmH20 Pressure Support:  [5 cmH20] 5 cmH20 Plateau Pressure:  [14 cmH20-17 cmH20] 17 cmH20   Intake/Output Summary (Last 24 hours) at 10/14/2019 0748 Last data filed at 10/14/2019 0700 Gross per 24 hour  Intake 4423.51 ml  Output 1275 ml  Net 3148.51 ml   Filed Weights   10/10/19 2300 10/11/19 0324 10/12/19 0500  Weight: 68.7 kg 71.3 kg 71.4 kg    Physical Exam Constitutional:      General: He is not in acute distress.    Appearance: He is not toxic-appearing.     Comments: Awake and Corey to follow command HENT:     Head: Normocephalic.  Eyes: PERRLA    General: No scleral icterus.       Right eye: No discharge.        Left eye: No discharge.     Conjunctiva/sclera: Conjunctivae normal.     Pupils: Pupils are equal, round, and reactive to light.  Cardiovascular:     Rate and Rhythm: Normal rate and regular rhythm.     Heart sounds: No murmur heard.  Pulmonary:     Effort: Pulmonary effort is normal. No respiratory distress.  GI:    Normal bowel sounds Musculoskeletal:     Right lower leg: No edema.     Left lower leg: No edema.  Skin:    General: Skin is warm.  Coloration: Skin is not jaundiced.  Neurological:     Comments: Corey to follow command.  Corey to move left arm and left leg.  Unable to lift his head up  Resolved Hospital Problem list     Assessment & Plan:  Status epilepticus in the setting of recent large left MCA stroke Mechanical ventilation for airway protection Acute kidney injury Urinary tract infection with Klebsiella pneumoniae Hypertension Diabetes  Plan:  Status epilepticus in the setting of recent large left MCA stroke EEG shows evidence of epileptogenic city in the left frontal temporal  region likely secondary to underlying encephalomalacia and post ictal state.  No seizure was observed.  MRI of brain shows chronic hemorraghic infract with an ill defined hyperintensity throughout the left basal ganglia and left thalamus concerning for encephalitis, infiltrating tumor or atypical subacute infarction. Patient is afebrile in the last 24 hours.  We will continue to Bennett for fever trend.  Neurology is on board and will decide if patient needs LP.  Plavix was held. -Appreciate neurology recommendation -Continue vancomycin, ceftriaxone, ampicillin for empirical coverage meningitis -Continue frequent neurochecks -Continue phenytoin and Keppra per neurology  -Continue aspirin -Holding Plavix in case LP is needed if patient continues to be febrile   Mechanical ventilation for airway protection Patient is off of sedation since yesterday and is more alert and awake.  His RSBI score of 29 indicates likely successful extubation.  Will attempt extubation today    Klebsiella UTI Urine culture shows more than 20,000 colonies of Klebsiella pneumoniae which is sensitive to ceftriaxone.  We will continue the empiric antibiotics with vancomycin, ceftriaxone and ampicillin -Continue to trend for fever and WBC   AKI Hypokalemia Baseline creatinine of 1.23.   FeNa of 0.6% which indicates prerenal.  Renal ultrasound did not show any obstructions.  Thinking this could be secondary to vancomycin.  Continue Foley and Bennett BMP daily -Continue to trend creatinine   Recent dental infection MRI of the face shows no abscess.  Blood culture shows no growth on day 2.  Leukocytosis trending down from 19-15.  Patient has a fever of 101 and 100.6 this morning.  No clear source of infection.  May attempt LP per neurology to rule out meningitis. -Continue empiric antibiotics for meningitis -Continue to trend fever and WBC   Hypertension -Continue amlodipine 10 mg -Continue losartan 25 mg  daily -Labetalol as needed for systolic pressure more than 180   Diabetes Refeeding syndrome Last A1c of 7.2.  CBG continue to be elevated in the 200s.  Bennett magnesium and phosphorus for refeeding syndrome.  And replete as needed -Bennett CBG -15 units Lantus daily -NovoLog 5 units 3 times daily with meals -Sliding scale insulin   Best practice:  Diet: Tube feed Pain/Anxiety/Delirium protocol (if indicated): Propofol, Fentanyl VAP protocol (if indicated): HOB 30 degrees, suction prn DVT prophylaxis: heparin GI prophylaxis: protonix Glucose control: SSI, Lantus, NovoLog Mobility: bed rest Code Status: full code Family Communication:will update Disposition: ICU  Critical care time:20 min     Doran Stabler, DO Internal Medicine Residency My pager: [phone]

## 2019-10-14 NOTE — Progress Notes (Signed)
Subjective: No acute events overnight  HFW:YOVZCH to obtain due to intubation  Examination  Vital signs in last 24 hours: Temp:  [98.1 F (36.7 C)-99.5 F (37.5 C)] 98.1 F (36.7 C) (08/11 0800) Pulse Rate:  [76-92] 80 (08/11 1000) Resp:  [17-23] 20 (08/11 1000) BP: (127-170)/(54-76) 156/70 (08/11 1000) SpO2:  [98 %-100 %] 98 % (08/11 1000) FiO2 (%):  [40 %] 40 % (08/11 0735)  General: lying in bed,not in apparent distress CVS: pulse-normal rate and rhythm RS: breathing comfortably,CTAB Extremities: normal,warm Neuro: Awake, alert, attempting to speak but difficult to understand what patient is saying, follows simple commands like showing me 2 fingers, pupils equally round and reactive, able to track examiner in room, antigravity strength in all 4 extremities (left more than right).  Intermittent rhythmic right upper extremity flexion/extension at elbow movements  Basic Metabolic Panel: Recent Labs  Lab 10/10/19 2248 10/10/19 2248 10/10/19 2251 10/11/19 0040 10/11/19 0327 10/11/19 0327 10/11/19 0554 10/12/19 0921 10/12/19 1425 10/12/19 1757 10/13/19 0412 10/13/19 1754 10/14/19 0816  NA 135   < > 138   < > 135  --  137 138  --   --  138  --  138  K 3.6   < > 3.6   < > 4.3  --  3.5 3.2*  --   --  3.4*  --  3.4*  CL 97*   < > 99  --  101  --   --  102  --   --  106  --  106  CO2 21*  --   --   --  17*  --   --  20*  --   --  18*  --  20*  GLUCOSE 306*   < > 304*  --  292*  --   --  147*  --   --  152*  --  206*  BUN 30*   < > 30*  --  29*  --   --  28*  --   --  27*  --  29*  CREATININE 1.67*   < > 1.50*  --  1.63*  --   --  1.89*  --   --  1.85*  --  1.70*  CALCIUM 9.5  --   --    < > 9.1   < >  --  8.6*  --   --  8.4*  --  8.4*  MG  --   --   --   --  1.7  --   --   --  1.9 1.8 1.7 2.0  --   PHOS  --   --   --   --  3.4  --   --   --  3.5 3.5 3.0 3.0  --    < > = values in this interval not displayed.    CBC: Recent Labs  Lab 10/10/19 2248 10/10/19 2251  10/11/19 0327 10/11/19 0554 10/11/19 1151 10/13/19 0412 10/14/19 0816  WBC 19.6*  --  15.1*  --  15.5* 15.4* 16.6*  NEUTROABS 16.7*  --   --   --  13.3*  --   --   HGB 11.7*   < > 11.9* 10.5* 10.7* 9.1* 9.3*  HCT 34.5*   < > 34.3* 31.0* 30.6* 27.0* 27.7*  MCV 85.0  --  84.3  --  85.0 87.9 87.7  PLT 329  --  317  --  293 239 267   < > =  values in this interval not displayed.     Coagulation Studies: No results for input(s): LABPROT, INR in the last 72 hours.  Imaging No new brain imaging  ASSESSMENT AND PLAN: 59 year old male with left MCA stroke who presented with focal convulsive status epilepticus which improved after Keppra, Dilantin and propofol. However patient has also been febrile, does have UTI.  Chronic left MCA infarct Status epilepticus (resolved) -Patient appears to have intermittent rhythmic flexion extension movement at right elbow during examination concerning for focal motor seizures.  Recommendations -ContinueKeppra to 750mg  BID ( renally dosed)andDilantin at100mg  TID.  -We will check Dilantin level and adjust if subtherapeutic -If Dilantin level therapeutic, will add clobazam -If patient continues to be febrile for 48 hours, can consider discontinuing/de-escalating antibiotics -We will hold off Plavix with plan to perform lumbar puncture on Friday if patient continues to be febrile. -Given MRI appearance, low suspicion for meningitis. However we will continue antibiotics empirically as patient continues to be febrile. -Management of rest of comorbidities per primary team  I have spent a total of 35 minutes with the patient reviewing hospital notes,  test results, labs and examining the patient as well as establishing an assessment and plan that was discussed personally with the patient's team and patient.  > 50% of time was spent in direct patient care.     Corey Bennett Thursday

## 2019-10-14 NOTE — Progress Notes (Signed)
Critical care MD updated on respiratory status of patient.  RN to continue to monitor.

## 2019-10-15 ENCOUNTER — Ambulatory Visit: Payer: BC Managed Care – PPO

## 2019-10-15 ENCOUNTER — Inpatient Hospital Stay (HOSPITAL_COMMUNITY): Payer: BC Managed Care – PPO

## 2019-10-15 DIAGNOSIS — I10 Essential (primary) hypertension: Secondary | ICD-10-CM

## 2019-10-15 DIAGNOSIS — R1312 Dysphagia, oropharyngeal phase: Secondary | ICD-10-CM

## 2019-10-15 LAB — BASIC METABOLIC PANEL
Anion gap: 11 (ref 5–15)
BUN: 21 mg/dL — ABNORMAL HIGH (ref 6–20)
CO2: 23 mmol/L (ref 22–32)
Calcium: 8.6 mg/dL — ABNORMAL LOW (ref 8.9–10.3)
Chloride: 109 mmol/L (ref 98–111)
Creatinine, Ser: 1.53 mg/dL — ABNORMAL HIGH (ref 0.61–1.24)
GFR calc Af Amer: 57 mL/min — ABNORMAL LOW (ref >60)
GFR calc non Af Amer: 49 mL/min — ABNORMAL LOW (ref >60)
Glucose, Bld: 123 mg/dL — ABNORMAL HIGH (ref 70–99)
Potassium: 3.1 mmol/L — ABNORMAL LOW (ref 3.5–5.1)
Sodium: 143 mmol/L (ref 135–145)

## 2019-10-15 LAB — CBC
HCT: 25.6 % — ABNORMAL LOW (ref 39.0–52.0)
Hemoglobin: 8.6 g/dL — ABNORMAL LOW (ref 13.0–17.0)
MCH: 29 pg (ref 26.0–34.0)
MCHC: 33.6 g/dL (ref 30.0–36.0)
MCV: 86.2 fL (ref 80.0–100.0)
Platelets: 306 10*3/uL (ref 150–400)
RBC: 2.97 MIL/uL — ABNORMAL LOW (ref 4.22–5.81)
RDW: 14.2 % (ref 11.5–15.5)
WBC: 16.6 10*3/uL — ABNORMAL HIGH (ref 4.0–10.5)
nRBC: 0 % (ref 0.0–0.2)

## 2019-10-15 LAB — GLUCOSE, CAPILLARY
Glucose-Capillary: 69 mg/dL — ABNORMAL LOW (ref 70–99)
Glucose-Capillary: 99 mg/dL (ref 70–99)
Glucose-Capillary: 103 mg/dL — ABNORMAL HIGH (ref 70–99)
Glucose-Capillary: 126 mg/dL — ABNORMAL HIGH (ref 70–99)
Glucose-Capillary: 114 mg/dL — ABNORMAL HIGH (ref 70–99)

## 2019-10-15 LAB — PHENYTOIN LEVEL, FREE AND TOTAL: Phenytoin, Total: 22.6 ug/mL — ABNORMAL HIGH (ref 10.0–20.0)

## 2019-10-15 LAB — PHENYTOIN LEVEL, TOTAL: Phenytoin Lvl: 13.4 ug/mL (ref 10.0–20.0)

## 2019-10-15 MED ORDER — ASPIRIN 300 MG RE SUPP
150.0000 mg | Freq: Every day | RECTAL | Status: DC
  Administered 2019-10-15 – 2019-10-16 (×2): 150 mg via RECTAL
  Filled 2019-10-15 (×4): qty 1

## 2019-10-15 MED ORDER — POTASSIUM CHLORIDE 10 MEQ/100ML IV SOLN
10.0000 meq | INTRAVENOUS | Status: AC
  Administered 2019-10-15 (×6): 10 meq via INTRAVENOUS
  Filled 2019-10-15 (×6): qty 100

## 2019-10-15 MED ORDER — SODIUM CHLORIDE 0.9 % IV SOLN: INTRAVENOUS | Status: DC

## 2019-10-15 MED ORDER — POTASSIUM CHLORIDE 10 MEQ/100ML IV SOLN: 10.0000 meq | INTRAVENOUS | Status: DC

## 2019-10-15 MED ORDER — SODIUM CHLORIDE 0.9 % IV SOLN
200.0000 mg | Freq: Once | INTRAVENOUS | Status: AC
  Administered 2019-10-15: 200 mg via INTRAVENOUS
  Filled 2019-10-15: qty 20

## 2019-10-15 MED ORDER — DEXTROSE 50 % IV SOLN
1.0000 | Freq: Once | INTRAVENOUS | Status: AC
  Administered 2019-10-15: 50 mL via INTRAVENOUS

## 2019-10-15 MED ORDER — DEXTROSE 50 % IV SOLN
INTRAVENOUS | Status: AC
  Filled 2019-10-15: qty 50

## 2019-10-15 MED ORDER — SODIUM CHLORIDE 0.9 % IV SOLN
100.0000 mg | Freq: Two times a day (BID) | INTRAVENOUS | Status: DC
  Administered 2019-10-15 – 2019-10-16 (×2): 100 mg via INTRAVENOUS
  Filled 2019-10-15 (×4): qty 10

## 2019-10-15 NOTE — Progress Notes (Signed)
LTM EEG hooked up and running - no initial skin breakdown - push button tested - neuro notified.  

## 2019-10-15 NOTE — Progress Notes (Signed)
Subjective: No acute events overnight.  Continues to have right arm twitching.  ROS: Unable to obtain due to speech disturbance  Examination  Vital signs in last 24 hours: Temp:  [98 F (36.7 C)-99.5 F (37.5 C)] 99 F (37.2 C) (08/12 1200) Pulse Rate:  [45-112] 80 (08/12 1400) Resp:  [15-27] 24 (08/12 1400) BP: (132-167)/(55-141) 167/141 (08/12 1400) SpO2:  [88 %-100 %] 100 % (08/12 1400)  General: lying in bed,not in apparent distress CVS: pulse-normal rate and rhythm RS: breathing comfortably,CTAB Extremities: normal,warm Neuro: Awake, alert, attempting to speak but difficult to understand what patient is saying, did mouth the word pen but then had perseveration when other objects were shown, follows simple commands like sticking out his tongue, pupils equally round and reactive, able to track examiner in room, antigravity strength in all 4 extremities (left more than right).  Intermittent rhythmic right upper extremity flexion/extension at elbow movements  Basic Metabolic Panel: Recent Labs  Lab 10/11/19 0327 10/11/19 0327 10/11/19 0554 10/12/19 0921 10/12/19 0921 10/12/19 1425 10/12/19 1757 10/13/19 0412 10/13/19 1754 10/14/19 0816 10/15/19 0426  NA 135   < > 137 138  --   --   --  138  --  138 143  K 4.3   < > 3.5 3.2*  --   --   --  3.4*  --  3.4* 3.1*  CL 101  --   --  102  --   --   --  106  --  106 109  CO2 17*  --   --  20*  --   --   --  18*  --  20* 23  GLUCOSE 292*  --   --  147*  --   --   --  152*  --  206* 123*  BUN 29*  --   --  28*  --   --   --  27*  --  29* 21*  CREATININE 1.63*  --   --  1.89*  --   --   --  1.85*  --  1.70* 1.53*  CALCIUM 9.1   < >  --  8.6*   < >  --   --  8.4*  --  8.4* 8.6*  MG 1.7  --   --   --   --  1.9 1.8 1.7 2.0  --   --   PHOS 3.4  --   --   --   --  3.5 3.5 3.0 3.0  --   --    < > = values in this interval not displayed.    CBC: Recent Labs  Lab 10/10/19 2248 10/10/19 2251 10/11/19 0327 10/11/19 0327  10/11/19 0554 10/11/19 1151 10/13/19 0412 10/14/19 0816 10/15/19 0426  WBC 19.6*   < > 15.1*  --   --  15.5* 15.4* 16.6* 16.6*  NEUTROABS 16.7*  --   --   --   --  13.3*  --   --   --   HGB 11.7*   < > 11.9*   < > 10.5* 10.7* 9.1* 9.3* 8.6*  HCT 34.5*   < > 34.3*   < > 31.0* 30.6* 27.0* 27.7* 25.6*  MCV 85.0   < > 84.3  --   --  85.0 87.9 87.7 86.2  PLT 329   < > 317  --   --  293 239 267 306   < > = values in this interval not displayed.  Coagulation Studies: No results for input(s): LABPROT, INR in the last 72 hours.  Imaging No new brain imaging overnight  ASSESSMENT AND PLAN: 59 year old male with left MCA stroke who presented with focal convulsive status epilepticus which improved after Keppra, Dilantin and propofol. However patient has also been febrile,does have UTI.  Chronic left MCA infarct Focal motor status epilepticus -Patient appears to have intermittent rhythmic flexion extension movement at right elbow during examination concerning for focal motor status epilepticus  Recommendations -ContinueKeppra to 750mg  BID ( renally dosed)andDilantin at100mg  TID.  -Started clobazam but patient currently does not have p.o. access therefore was not administered -Load with IV Vimpat 200 mg and start 100 mg twice daily for maintenance -We will obtain another video EEG as patient appears to continue to have focal motor seizures of right upper extremity. -Management of rest of comorbidities per primary team  I have spent a total of35 minuteswith the patient reviewing hospitalnotes,  test results, labs and examining the patient as well as establishing an assessment and plan that was discussed personally with the patient's team and patient.>50% of time was spent in direct patient care.   Epilepsy [facility] For questions after 5pm please refer to AMION to reach the Neurologist on call

## 2019-10-15 NOTE — Evaluation (Signed)
Speech Language Pathology Evaluation Patient Details Name: Corey Bennett MRN: [id number] DOB: 1961/01/25 Today's Date: 10/15/2019 Time: 3086-5784 SLP Time Calculation (min) (ACUTE ONLY): 9 min  Problem List:  Patient Active Problem List   Diagnosis Date Noted  . Seizure (HCC) 10/11/2019  . Altered mental status   . Status epilepticus (HCC)   . Carotid artery stenosis 09/08/2019   Past Medical History:  Past Medical History:  Diagnosis Date  . Carotid artery occlusion   . Diabetes (HCC)   . Hypercholesterolemia   . Hypertension   . 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 Stroke [facility])    Past Surgical History:  Past Surgical History:  Procedure Laterality Date  . ENDARTERECTOMY Left 09/08/2019   Procedure: LEFT CAROTID ENDARTERECTOMY;  Surgeon: Chuck Hint, MD;  Location: [facility] OR;  Service: Vascular;  Laterality: Left;  . PATCH ANGIOPLASTY Left 09/08/2019   Procedure: PATCH ANGIOPLASTY USING XENOSURE BIOLOGIC PATCH 1x6cm;  Surgeon: Chuck Hint, MD;  Location: [facility] OR;  Service: Vascular;  Laterality: Left;  . TONSILLECTOMY     HPI:  Pt is a 59 yo male admitted from ALF with status epilepticus in the setting of recent large L MCA stroke, requiring intubation 8/8-8/11. Pt was evaluated by OP SLP 7/26 (after completing CIR in [place] and [facility] SLP), noting "receptive and expressive aphasia (likely severe) and some degree of verbal apraxia." He did not continue with SLP when he got a speech generating device. PMH: CVA with aphasia and hemiparesis, HTN, hypercholesterolemia, diabetes   Assessment / Plan / Recommendation Clinical Impression  Pt has a baseline receptive/expressive aphasia with apraxia. There are some similarties to baseline function noted today, including difficulty with confrontational naming (0% accuracy) that improves with sentence completion cues (increased to ~75% with phonemic paraphasias vs apraxic errors noted). However, this may also represent an  acute decline in function, as his spontaneous attempts at communication are decreased, as well as his accuracy with yes/no questions (75% accurate during OP evaluation, but only nodding "yes" to all questions asked today). Pt had also stopped OP SLP services when he obtained a speech generating device, but he has significant impairments in receptive identification that limit use of communication boards at the moment. He could not identify between objects or single words within a field of two, or identify single letters. He did follow a few simple commands, but with perseverative errors noted. Pt will benefit from SLP f/u to maximize functional communication.     SLP Assessment  SLP Recommendation/Assessment: Patient needs continued Speech Lanaguage Pathology Services SLP Visit Diagnosis: Aphasia (R47.01)    Follow Up Recommendations   (tba)    Frequency and Duration min 2x/week  2 weeks      SLP Evaluation Cognition  Overall Cognitive Status: No family/caregiver present to determine baseline cognitive functioning Arousal/Alertness: Awake/alert Orientation Level: Other (comment) (UTA)       Comprehension  Auditory Comprehension Overall Auditory Comprehension: Impaired Yes/No Questions: Impaired Basic Biographical Questions: 26-50% accurate Basic Immediate Environment Questions: 50-74% accurate Commands: Impaired One Step Basic Commands: 25-49% accurate Visual Recognition/Discrimination Discrimination: Exceptions to [facility] Common Objects: Unable to indentify Reading Comprehension Reading Status: Impaired Word level: Impaired    Expression Expression Primary Mode of Expression: Verbal Verbal Expression Overall Verbal Expression: Impaired at baseline Initiation: Impaired Level of Generative/Spontaneous Verbalization:  (none noted spontaneously) Repetition: Impaired Level of Impairment: Word level Naming: Impairment Confrontation: Impaired Common Objects: Unable to  indentify Interfering Components: Premorbid deficit Effective Techniques:  Sentence completion Non-Verbal Means of Communication: Gestures   Oral / Motor  Oral Motor/Sensory Function Overall Oral Motor/Sensory Function: Moderate impairment Facial ROM: Reduced right;Suspected CN VII (facial) dysfunction Facial Symmetry: Abnormal symmetry right;Suspected CN VII (facial) dysfunction Facial Strength: Reduced right;Suspected CN VII (facial) dysfunction Motor Speech Overall Motor Speech: Impaired Motor Planning: Impaired Level of Impairment: Word   GO                    [name]., M.A. CCC-SLP Acute Rehabilitation Services Pager [phone] Office [phone]  10/15/2019, 9:22 AM

## 2019-10-15 NOTE — Evaluation (Signed)
Physical Therapy Evaluation Patient Details Name: Corey Bennett MRN: [id number] DOB: [birth year] Today's Date: 10/15/2019   History of Present Illness  59 y.o. 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  He was intubated for airway protection on 10/10/19. Pt extubated on 10/14/19.  Diagnosis: status epilepticus in setting of prior large Lt MCA infarct; acute kidney injury, possible acute meningitis, HTN. PMH includes:  poorly controlled DM, dysphagia, HTN   Clinical Impression  Pt presents to PT with deficits in functional mobility, gait, balance, endurance, strength, power, coordination, communication. Pt demonstrates qualities of both receptive and expressive aphasia during session, but does follow some verbal and visual cues from therapist well. Pt is very unsteady in standing and requires physical assistance to weight shift during transfers. Pt also requiring physical assistance to pull during bed mobility due to weakness in core. Pt will benefit from continued acute PT POC to improve mobility quality and to reduce falls risk.    Follow Up Recommendations SNF;Supervision/Assistance - 24 hour    Equipment Recommendations  Wheelchair (measurements PT);Wheelchair cushion (measurements PT) (TBD, quad cane vs RW)    Recommendations for Other Services       Precautions / Restrictions Precautions Precautions: Fall Precaution Comments: aphasic  Restrictions Weight Bearing Restrictions: No      Mobility  Bed Mobility Overal bed mobility: Needs Assistance Bed Mobility: Supine to Sit     Supine to sit: Mod assist;+2 for physical assistance;+2 for safety/equipment     General bed mobility comments: assist to initiate movement, as well as assist to move LEs off the bed and to lift trunk   Transfers Overall transfer level: Needs assistance Equipment used: 2 person hand held  assist Transfers: Sit to/from Stand;Stand Pivot Transfers Sit to Stand: Mod assist;+2 safety/equipment Stand pivot transfers: Mod assist;+2 physical assistance;+2 safety/equipment       General transfer comment: pt requires assist/facilitation to fully extend hips.  He required assist to weight shift and advance and place Rt LE during transfer and assist to control descent into chair   Ambulation/Gait                Stairs            Wheelchair Mobility    Modified Rankin (Stroke Patients Only)       Balance Overall balance assessment: Needs assistance Sitting-balance support: Feet supported Sitting balance-Leahy Scale: Poor Sitting balance - Comments: Pt requires min guard up to min A to maintain static sitting and requires UE support    Standing balance support: Bilateral upper extremity supported Standing balance-Leahy Scale: Poor Standing balance comment: modA x2 to maintain static standing                              Pertinent Vitals/Pain Pain Assessment: Faces Faces Pain Scale: No hurt    Home Living Family/patient expects to be discharged to:: Unsure     Type of Home: Assisted living           Additional Comments: [facility].  Pt resided in AL prior to stroke 06/2019    Prior Function Level of Independence: Needs assistance   Gait / Transfers Assistance Needed: walking without device at ALF  ADL's / Homemaking Assistance Needed: Pt indicates he required some assistance with bathing, dressing, and that he was able to perform toileting  mod I.  He also indicates he was able to self feed, and his meals were delivered to his room.  No family present to confirm accuracy of info.         Hand Dominance        Extremity/Trunk Assessment   Upper Extremity Assessment Upper Extremity Assessment: RUE deficits/detail RUE Deficits / Details: Pt maintains Rt UE in flexed position.  He has twitching movements of Rt UE, and appears  to have flexor spasticity, however, he is inhibit it.  shoulder PROM to ~100*; elbow flex/ext WFL passively; finger flexion/ext WFL  RUE Coordination: decreased fine motor;decreased gross motor    Lower Extremity Assessment Lower Extremity Assessment: Defer to PT evaluation RLE Deficits / Details: grossly 3/5, ROM WFL    Cervical / Trunk Assessment Cervical / Trunk Assessment: Other exceptions;Kyphotic Cervical / Trunk Exceptions: Pt with flexed posture and has capital extension of head/neck   Communication   Communication: Receptive difficulties;Expressive difficulties  Cognition Arousal/Alertness: Awake/alert Behavior During Therapy: Flat affect Overall Cognitive Status: Difficult to assess                                 General Comments: pt follows simple commands with gestural cues.  Cognition difficult to assess due to severity of communication deficits       General Comments General comments (skin integrity, edema, etc.): VSS     Exercises     Assessment/Plan    PT Assessment Patient needs continued PT services  PT Problem List Decreased strength;Decreased activity tolerance;Decreased balance;Decreased mobility;Decreased coordination;Decreased cognition;Decreased knowledge of use of DME;Decreased safety awareness;Decreased knowledge of precautions;Impaired tone       PT Treatment Interventions DME instruction;Gait training;Stair training;Functional mobility training;Therapeutic activities;Therapeutic exercise;Balance training;Neuromuscular re-education;Cognitive remediation;Patient/family education    PT Goals (Current goals can be found in the Care Plan section)  Acute Rehab PT Goals Patient Stated Goal: pt unable to state  PT Goal Formulation: With patient Time For Goal Achievement: 10/29/19 Potential to Achieve Goals: Good    Frequency Min 3X/week (at 3x due to fairly recent CVA)   Barriers to discharge        Co-evaluation PT/OT/SLP  Co-Evaluation/Treatment: Yes Reason for Co-Treatment: Necessary to address cognition/behavior during functional activity;For patient/therapist safety;To address functional/ADL transfers PT goals addressed during session: Mobility/safety with mobility;Balance;Strengthening/ROM OT goals addressed during session: ADL's and self-care       AM-PAC PT "6 Clicks" Mobility  Outcome Measure Help needed turning from your back to your side while in a flat bed without using bedrails?: A Lot Help needed moving from lying on your back to sitting on the side of a flat bed without using bedrails?: A Lot Help needed moving to and from a bed to a chair (including a wheelchair)?: A Lot Help needed standing up from a chair using your arms (e.g., wheelchair or bedside chair)?: A Lot Help needed to walk in hospital room?: Total Help needed climbing 3-5 steps with a railing? : Total 6 Click Score: 10    End of Session Equipment Utilized During Treatment: Gait belt Activity Tolerance: Patient tolerated treatment well Patient left: in chair;with call bell/phone within reach;with chair alarm set Nurse Communication: Mobility status PT Visit Diagnosis: Unsteadiness on feet (R26.81);Other abnormalities of gait and mobility (R26.89);Muscle weakness (generalized) (M62.81);Difficulty in walking, not elsewhere classified (R26.2);Other symptoms and signs involving the nervous system (Q59.563)    Time: 1202-1229 PT Time Calculation (min) (ACUTE ONLY):  27 min   Charges:   PT Evaluation $PT Eval Moderate Complexity: 1 Mod          Arlyss Gandy, PT, DPT Acute Rehabilitation Pager: [phone]   Arlyss Gandy 10/15/2019, 4:27 PM

## 2019-10-15 NOTE — Progress Notes (Signed)
Per CCM continue to intermittent bladder scan and I/O as needed. Day shift 3W RN updated.  Last bladder scan/In and Out at 1755.

## 2019-10-15 NOTE — Evaluation (Signed)
Occupational Therapy Evaluation Patient Details Name: Corey Bennett MRN: [id number] DOB: 02/12/1961 Today's Date: 10/15/2019    History of Present Illness 59 y.o. 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  He was intubated for airway protection on 10/10/19. Pt extubated on 10/14/19.  Diagnosis: status epilepticus in setting of prior large Lt MCA infarct; acute kidney injury, possible acute meningitis, HTN. PMH includes:  poorly controlled DM, dysphagia, HTN    Clinical Impression   Pt admitted with above. He demonstrates the below listed deficits and will benefit from continued OT to maximize safety and independence with BADLs.  Pt presents to OT with Rt hemiparesis, decreased activity tolerance, impaired balance, and communication deficits.  He currently requires min - max A for ADLs and mod A +2 for functional transfers.  Pt resides at [facility] and reportedly was mod I with ambulation and needed some assist for ADLs.  Feel he would benefit from CIR to allow him to return to ALF.  Will follow acutely.       Follow Up Recommendations  CIR    Equipment Recommendations  None recommended by OT    Recommendations for Other Services Rehab consult     Precautions / Restrictions Precautions Precautions: Fall Precaution Comments: aphasic       Mobility Bed Mobility Overal bed mobility: Needs Assistance Bed Mobility: Supine to Sit     Supine to sit: Mod assist;+2 for physical assistance;+2 for safety/equipment     General bed mobility comments: assist to initiate movement, as well as assist to move LEs off the bed and to lift trunk   Transfers Overall transfer level: Needs assistance Equipment used: 2 person hand held assist Transfers: Sit to/from Stand;Stand Pivot Transfers Sit to Stand: Mod assist;+2 safety/equipment Stand pivot transfers: Mod assist;+2 physical  assistance;+2 safety/equipment       General transfer comment: pt requires assist/facilitation to fully extend hips.  He required assist to weight shift and advance and place Rt LE during transfer and assist to control descent into chair     Balance Overall balance assessment: Needs assistance Sitting-balance support: Feet supported Sitting balance-Leahy Scale: Poor Sitting balance - Comments: Pt requires min guard up to min A to maintain static sitting and requires UE support                                    ADL either performed or assessed with clinical judgement   ADL Overall ADL's : Needs assistance/impaired Eating/Feeding: Set up;Supervision/ safety;Sitting   Grooming: Wash/dry hands;Wash/dry face;Oral care;Minimal assistance;Sitting   Upper Body Bathing: Moderate assistance;Sitting   Lower Body Bathing: Moderate assistance;Sit to/from stand   Upper Body Dressing : Moderate assistance;Sitting   Lower Body Dressing: Moderate assistance;Sit to/from stand Lower Body Dressing Details (indicate cue type and reason): Pt able to don socks with min A to get sock started over toes and for balance  Toilet Transfer: Moderate assistance;+2 for physical assistance;+2 for safety/equipment;Stand-pivot;BSC   Toileting- Clothing Manipulation and Hygiene: Maximal assistance;Sit to/from stand       Functional mobility during ADLs: Moderate assistance;+2 for physical assistance;+2 for safety/equipment       Vision Baseline Vision/History: Wears glasses Additional Comments: Pt has glasses in room, but unable to determine if he wears them all of the time, or just for reading.  He will track object into all quadrants.  He is unable to participate in more complex assessement due to severity of communication deficits      Perception Perception Perception Tested?: Yes   Praxis Praxis Praxis tested?: Within functional limits (grossly assessed )    Pertinent Vitals/Pain  Pain Assessment: Faces Faces Pain Scale: No hurt     Hand Dominance     Extremity/Trunk Assessment Upper Extremity Assessment Upper Extremity Assessment: RUE deficits/detail RUE Deficits / Details: Pt maintains Rt UE in flexed position.  He has twitching movements of Rt UE, and appears to have flexor spasticity, however, he is inhibit it.  shoulder PROM to ~100*; elbow flex/ext WFL passively; finger flexion/ext WFL  RUE Coordination: decreased fine motor;decreased gross motor   Lower Extremity Assessment Lower Extremity Assessment: Defer to PT evaluation   Cervical / Trunk Assessment Cervical / Trunk Assessment: Other exceptions;Kyphotic Cervical / Trunk Exceptions: Pt with flexed posture and has capital extension of head/neck    Communication Communication Communication: Receptive difficulties;Expressive difficulties   Cognition Arousal/Alertness: Awake/alert Behavior During Therapy: Flat affect Overall Cognitive Status: Difficult to assess                                 General Comments: pt follows simple commands with gestural cues.  Cognition difficult to assess due to severity of communication deficits    General Comments  VSS     Exercises     Shoulder Instructions      Home Living Family/patient expects to be discharged to:: Unsure     Type of Home: Assisted living                           Additional Comments: [facility].  Pt resided in AL prior to stroke 06/2019      Prior Functioning/Environment Level of Independence: Needs assistance  Gait / Transfers Assistance Needed: walking without device at ALF ADL's / Homemaking Assistance Needed: Pt indicates he required some assistance with bathing, dressing, and that he was able to perform toileting mod I.  He also indicates he was able to self feed, and his meals were delivered to his room.  No family present to confirm accuracy of info.             OT Problem List:  Decreased strength;Decreased range of motion;Decreased activity tolerance;Impaired balance (sitting and/or standing);Impaired vision/perception;Decreased coordination;Decreased cognition;Decreased knowledge of use of DME or AE;Decreased safety awareness;Impaired tone;Impaired UE functional use      OT Treatment/Interventions: Self-care/ADL training;Neuromuscular education;DME and/or AE instruction;Therapeutic activities;Splinting;Cognitive remediation/compensation;Visual/perceptual remediation/compensation;Patient/family education;Balance training    OT Goals(Current goals can be found in the care plan section) Acute Rehab OT Goals Patient Stated Goal: pt unable to state  OT Goal Formulation: Patient unable to participate in goal setting Time For Goal Achievement: 10/29/19 Potential to Achieve Goals: Good ADL Goals Pt Will Perform Grooming: (P) with min guard assist;standing Pt Will Perform Upper Body Bathing: (P) with supervision;with set-up;sitting Pt Will Perform Lower Body Bathing: (P) with min guard assist;sit to/from stand Pt Will Perform Upper Body Dressing: (P) with set-up;with supervision;sitting Pt Will Perform Lower Body Dressing: (P) with min guard assist;sit to/from stand Pt Will Transfer to Toilet: (P) with min assist;ambulating;regular height toilet;bedside commode;grab bars Pt Will Perform Toileting - Clothing Manipulation and hygiene: (P) with min guard assist;sit to/from stand  OT Frequency: Min 2X/week   Barriers  to D/C:    Lives in ALF        Co-evaluation PT/OT/SLP Co-Evaluation/Treatment: Yes Reason for Co-Treatment: Necessary to address cognition/behavior during functional activity;For patient/therapist safety;To address functional/ADL transfers   OT goals addressed during session: ADL's and self-care      AM-PAC OT "6 Clicks" Daily Activity     Outcome Measure Help from another person eating meals?: A Little Help from another person taking care of personal  grooming?: A Little Help from another person toileting, which includes using toliet, bedpan, or urinal?: A Lot Help from another person bathing (including washing, rinsing, drying)?: A Lot Help from another person to put on and taking off regular upper body clothing?: A Lot Help from another person to put on and taking off regular lower body clothing?: A Lot 6 Click Score: 14   End of Session Nurse Communication: Mobility status  Activity Tolerance: Patient tolerated treatment well Patient left: in chair;with call bell/phone within reach;with chair alarm set  OT Visit Diagnosis: Hemiplegia and hemiparesis;Cognitive communication deficit (R41.841) Symptoms and signs involving cognitive functions: Cerebral infarction Hemiplegia - Right/Left: Right Hemiplegia - dominant/non-dominant: Dominant Hemiplegia - caused by: Cerebral infarction                Time: 1201-1228 OT Time Calculation (min): 27 min Charges:  OT General Charges $OT Visit: 1 Visit OT Evaluation $OT Eval Moderate Complexity: 1 Mod  Jerimy Johanson C., OTR/L Acute Rehabilitation Services Pager [phone] Office [phone]   Jeani Hawking M 10/15/2019, 4:05 PM

## 2019-10-15 NOTE — Progress Notes (Addendum)
NAME:  Corey Bennett, MRN:  [id number], DOB:  1960/08/26, LOS: 4 ADMISSION DATE:10/10/2019, CONSULTATION DATE:10/11/19 REFERRING MD: EDP, CHIEF COMPLAINT:seizure  Brief History   Corey Bennett is a 59 year old male with past medical history of 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  Patient is living in a assisted living facility and can ambulate by himself.  Patient was found by staff to have a right sided twitching with right eye deviation.  He was given Versed and Dilantin and was intubated for airway protection.  Past Medical History  Left MCA stroke with residual aphasia and right hemiparesis CEA Hypertension Hypercholesterolemia Diabetes   Significant Hospital Events   8/8 admit to PCCM  Consults:  Neurology  Procedures:  8/8 ETT  Significant Diagnostic Tests:  8/8 CT head>>No acute intracranial abnormality. Large area of encephalomalacia throughout the left MCA territory, consistent with remote infarct. 8/8 CXR>>lungs clear 8/8 UDS>>+ benzo (but given versed in ED); o/w negative 8/8 MRI of brain >> large territory chronic hemorrhagic infarct in the left frontal lobe.  Ill-defined hyperintensity throughout the left basal ganglia and left thalamus with mild enhancement in dobutamine likely secondary to encephalitis, infiltrating tumor or atypical subacute infarction 8/8 MRI of face and trigeminal region >> rules out soft tissue abscess  Micro Data:  8/8 Sars-CoV-2>>negative 8/8 BCx2>> no growth 8/8 UC>>  Klebsiella pneumonia  Antimicrobials:  Vancomycin 8/8>> Ceftriaxone 8/8>> Ampicillin 8/8>>8/11  Interim history/subjective:  Patient is seen at bedside today.  He is comfortable and in no acute respiratory distress.  He is satting well on room air.  Patient is able to follow commands but unable to speak.  Unknown baseline mental status.  Some twitching  of right arm and head noted during examination.  He failed swallow study today so we will continue n.p.o.  Objective   Blood pressure 137/65, pulse 80, temperature 98 F (36.7 C), temperature source Axillary, resp. rate (!) 25, height 6\' 1"  (1.854 m), weight 71.4 kg, SpO2 97 %.    Vent Mode: CPAP;PSV FiO2 (%):  [40 %] 40 % PEEP:  [5 cmH20] 5 cmH20 Pressure Support:  [5 cmH20] 5 cmH20   Intake/Output Summary (Last 24 hours) at 10/15/2019 0732 Last data filed at 10/15/2019 0700 Gross per 24 hour  Intake 1925.58 ml  Output 1200 ml  Net 725.58 ml   Filed Weights   10/10/19 2300 10/11/19 0324 10/12/19 0500  Weight: 68.7 kg 71.3 kg 71.4 kg    Physical Exam Constitutional:      General: He is not in acute distress.    Appearance: He is not toxic-appearing.     Comments: Awake and able to follow command.  Unable to speak.   HENT:     Head: Normocephalic.  Eyes:     General: No scleral icterus.       Right eye: No discharge.        Left eye: No discharge.     Conjunctiva/sclera: Conjunctivae normal.  Cardiovascular:     Rate and Rhythm: Normal rate and regular rhythm.     Heart sounds: No murmur heard.  Pulmonary:     Effort: Pulmonary effort is normal. No respiratory distress.  Musculoskeletal:     Right lower leg: No edema.     Left lower leg: No edema.  Skin:    General: Skin is warm.     Coloration: Skin is not jaundiced.  Neurological:     Comments:  Able to follow command.  Normal strength of left upper and lower extremity.  Unable to move right arm and right leg.  Some twitching on right arm and head during examination.  Resolved Hospital Problem list     Assessment & Plan:  Status epilepticus in the setting of recent large left MCA stroke Acute kidney injury Hypertension Diabetes  Plan:  Status epilepticus in the setting of recent large left MCA stroke Patient still has some twitching of right arm and head noted during examination.  Neurology is on board and  added clobazam.  Meningitis is lower on the differential list.  Patient is afebrile in the last 48 hours.  We will continue to Bennett for fever trend.  Neurology is on board and will decide if patient needs LP.  Plavix was held.    -Appreciate neurology recommendation -Continue vancomycin, ceftriaxone for empirical coverage meningitis.  Continue to Bennett the next 48 hours. -Continue frequent neurochecks -Continue phenytoin, clobazam and Keppra per neurology -Continue aspirin -Holding Plavix in case LP is needed if patient continues to be febrile   Post extubation Patient satting well on room air even though he still has a lot of secretion.  Patient failed swallow study and will be made n.p.o. at the moment.  We will continue to Bennett his respiratory status.  If he continues to do well, will sign off to [facility] hospitalist for continuing medical management.   AKI Hypokalemia Baseline creatinine of 1.23. Renal ultrasound did not show any obstructions.  Creatinine trending down from 1.7-1.53.   -Continue to trend creatinine -Continue repleting potassium   Recent dental infection MRI of the face shows no abscess.     Hypertension -Continue amlodipine 10 mg -Continue losartan 25 mg daily -Labetalol as needed for systolic pressure more than 180   Diabetes Refeeding syndrome Last A1c of 7.2.  CBG in good range.  Bennett magnesium and phosphorus for refeeding syndrome.  And replete as needed -Bennett CBG -15 units Lantus daily -Sliding scale insulin   Best practice:  Diet: Tube feed Pain/Anxiety/Delirium protocol (if indicated): Fentanyl VAP protocol (if indicated): HOB 30 degrees, suction prn DVT prophylaxis: heparin GI prophylaxis: protonix Glucose control: SSI, Lantus Mobility: bed rest Code Status: full code Family Communication:will update Disposition: ICU  Critical care time:20 min     Doran Stabler, DO Internal Medicine Residency My pager: [phone]

## 2019-10-15 NOTE — Evaluation (Signed)
Clinical/Bedside Swallow Evaluation Patient Details  Name: Corey Bennett MRN: [id number] Date of Birth: 1960-11-09  Today's Date: 10/15/2019 Time: SLP Start Time (ACUTE ONLY): 3009 SLP Stop Time (ACUTE ONLY): 0828 SLP Time Calculation (min) (ACUTE ONLY): 9 min  Past Medical History:  Past Medical History:  Diagnosis Date  . Carotid artery occlusion   . Diabetes (HCC)   . Hypercholesterolemia   . Hypertension   . 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 Stroke [facility])    Past Surgical History:  Past Surgical History:  Procedure Laterality Date  . ENDARTERECTOMY Left 09/08/2019   Procedure: LEFT CAROTID ENDARTERECTOMY;  Surgeon: Chuck Hint, MD;  Location: [facility] OR;  Service: Vascular;  Laterality: Left;  . PATCH ANGIOPLASTY Left 09/08/2019   Procedure: PATCH ANGIOPLASTY USING XENOSURE BIOLOGIC PATCH 1x6cm;  Surgeon: Chuck Hint, MD;  Location: [facility] OR;  Service: Vascular;  Laterality: Left;  . TONSILLECTOMY     HPI:  Pt is a 59 yo male admitted from ALF with status epilepticus in the setting of recent large L MCA stroke, requiring intubation 8/8-8/11. Pt was evaluated by OP SLP 7/26 (after completing CIR in [place] and [facility] SLP), noting "receptive and expressive aphasia (likely severe) and some degree of verbal apraxia." He did not continue with SLP when he got a speech generating device. PMH: CVA with aphasia and hemiparesis, HTN, hypercholesterolemia, diabetes   Assessment / Plan / Recommendation Clinical Impression  Pt has audible secretions noted at baseline, with secretions also collecting in his oral cavity. He does not make attempts to clear these secretions spontaneously, and has difficulty following commands to do so. When given ice and sips of water, there are signs of what may be a more chronic neurogenic dysphagia including R-sided weakness, slow oral transit, and residuals post-swallow. There may likely be a more acute impact from intubation as well given  significant dysphonia, as pt has multiple subswallows and immediate coughing with water. Despite coughs and use of yankauer, minimal amounts are expectorated. Recommend that he remain NPO for now given reduced secretion management. Will f/u for readiness to begin PO diet.   SLP Visit Diagnosis: Dysphagia, unspecified (R13.10)    Aspiration Risk  Moderate aspiration risk    Diet Recommendation NPO   Medication Administration: Via alternative means    Other  Recommendations Oral Care Recommendations: Oral care QID Other Recommendations: Have oral suction available   Follow up Recommendations  (tba)      Frequency and Duration min 2x/week  2 weeks       Prognosis Prognosis for Safe Diet Advancement: Good Barriers to Reach Goals: Language deficits      Swallow Study   General HPI: Pt is a 59 yo male admitted from ALF with status epilepticus in the setting of recent large L MCA stroke, requiring intubation 8/8-8/11. Pt was evaluated by OP SLP 7/26 (after completing CIR in [place] and [facility] SLP), noting "receptive and expressive aphasia (likely severe) and some degree of verbal apraxia." He did not continue with SLP when he got a speech generating device. PMH: CVA with aphasia and hemiparesis, HTN, hypercholesterolemia, diabetes Type of Study: Bedside Swallow Evaluation Previous Swallow Assessment: none in chart Diet Prior to this Study: NPO Temperature Spikes Noted: No Respiratory Status: Nasal cannula History of Recent Intubation: Yes Length of Intubations (days): 3 days Date extubated: 10/14/19 Behavior/Cognition: Alert;Cooperative;Requires cueing;Other (Comment) (aphasic) Oral Cavity Assessment: Excessive secretions Oral Care Completed by SLP: Yes Oral Cavity - Dentition: Poor  condition;Missing dentition Vision: Functional for self-feeding Self-Feeding Abilities: Needs assist Patient Positioning: Upright in bed Baseline Vocal Quality: Low vocal intensity;Hoarse Volitional  Cough: Cognitively unable to elicit Volitional Swallow: Unable to elicit    Oral/Motor/Sensory Function Overall Oral Motor/Sensory Function: Moderate impairment Facial ROM: Reduced right;Suspected CN VII (facial) dysfunction Facial Symmetry: Abnormal symmetry right;Suspected CN VII (facial) dysfunction Facial Strength: Reduced right;Suspected CN VII (facial) dysfunction   Ice Chips Ice chips: Impaired Presentation: Spoon;Self Fed Oral Phase Functional Implications: Oral residue;Prolonged oral transit Pharyngeal Phase Impairments: Multiple swallows;Wet Vocal Quality   Thin Liquid Thin Liquid: Impaired Presentation: Spoon Oral Phase Functional Implications: Oral residue Pharyngeal  Phase Impairments: Multiple swallows;Cough - Immediate    Nectar Thick Nectar Thick Liquid: Not tested   Honey Thick Honey Thick Liquid: Not tested   Puree Puree: Not tested   Solid     Solid: Not tested      Corey Bennett., M.A. CCC-SLP Acute Rehabilitation Services Pager [phone] Office [phone]  10/15/2019,8:46 AM

## 2019-10-16 ENCOUNTER — Inpatient Hospital Stay (HOSPITAL_COMMUNITY): Payer: BC Managed Care – PPO

## 2019-10-16 DIAGNOSIS — I6529 Occlusion and stenosis of unspecified carotid artery: Secondary | ICD-10-CM

## 2019-10-16 DIAGNOSIS — I10 Essential (primary) hypertension: Secondary | ICD-10-CM

## 2019-10-16 DIAGNOSIS — I619 Nontraumatic intracerebral hemorrhage, unspecified: Secondary | ICD-10-CM

## 2019-10-16 DIAGNOSIS — N179 Acute kidney failure, unspecified: Secondary | ICD-10-CM

## 2019-10-16 DIAGNOSIS — E785 Hyperlipidemia, unspecified: Secondary | ICD-10-CM

## 2019-10-16 DIAGNOSIS — E1169 Type 2 diabetes mellitus with other specified complication: Secondary | ICD-10-CM

## 2019-10-16 LAB — GLUCOSE, CAPILLARY
Glucose-Capillary: 107 mg/dL — ABNORMAL HIGH (ref 70–99)
Glucose-Capillary: 124 mg/dL — ABNORMAL HIGH (ref 70–99)
Glucose-Capillary: 142 mg/dL — ABNORMAL HIGH (ref 70–99)
Glucose-Capillary: 149 mg/dL — ABNORMAL HIGH (ref 70–99)
Glucose-Capillary: 171 mg/dL — ABNORMAL HIGH (ref 70–99)
Glucose-Capillary: 88 mg/dL (ref 70–99)
Glucose-Capillary: 98 mg/dL (ref 70–99)

## 2019-10-16 LAB — CBC
HCT: 25.2 % — ABNORMAL LOW (ref 39.0–52.0)
Hemoglobin: 8.5 g/dL — ABNORMAL LOW (ref 13.0–17.0)
MCH: 28.9 pg (ref 26.0–34.0)
MCHC: 33.7 g/dL (ref 30.0–36.0)
MCV: 85.7 fL (ref 80.0–100.0)
Platelets: 328 10*3/uL (ref 150–400)
RBC: 2.94 MIL/uL — ABNORMAL LOW (ref 4.22–5.81)
RDW: 13.9 % (ref 11.5–15.5)
WBC: 12 10*3/uL — ABNORMAL HIGH (ref 4.0–10.5)
nRBC: 0 % (ref 0.0–0.2)

## 2019-10-16 LAB — CULTURE, BLOOD (ROUTINE X 2)
Culture: NO GROWTH
Culture: NO GROWTH

## 2019-10-16 LAB — BASIC METABOLIC PANEL
Anion gap: 10 (ref 5–15)
BUN: 18 mg/dL (ref 6–20)
CO2: 22 mmol/L (ref 22–32)
Calcium: 8.5 mg/dL — ABNORMAL LOW (ref 8.9–10.3)
Chloride: 107 mmol/L (ref 98–111)
Creatinine, Ser: 1.31 mg/dL — ABNORMAL HIGH (ref 0.61–1.24)
GFR calc Af Amer: >60 mL/min (ref >60)
GFR calc non Af Amer: 60 mL/min — ABNORMAL LOW (ref >60)
Glucose, Bld: 110 mg/dL — ABNORMAL HIGH (ref 70–99)
Potassium: 3.3 mmol/L — ABNORMAL LOW (ref 3.5–5.1)
Sodium: 139 mmol/L (ref 135–145)

## 2019-10-16 LAB — PHENYTOIN LEVEL, TOTAL: Phenytoin Lvl: 12.6 ug/mL (ref 10.0–20.0)

## 2019-10-16 LAB — ALBUMIN: Albumin: 2.5 g/dL — ABNORMAL LOW (ref 3.5–5.0)

## 2019-10-16 IMAGING — DX DG ABD PORTABLE 1V
1 series · 1 of 1 positions shown · non-contrast
Comparison: [DATE]

CLINICAL DATA: Eating 2

EXAM:
PORTABLE ABDOMEN - 1 VIEW

[abdomen]
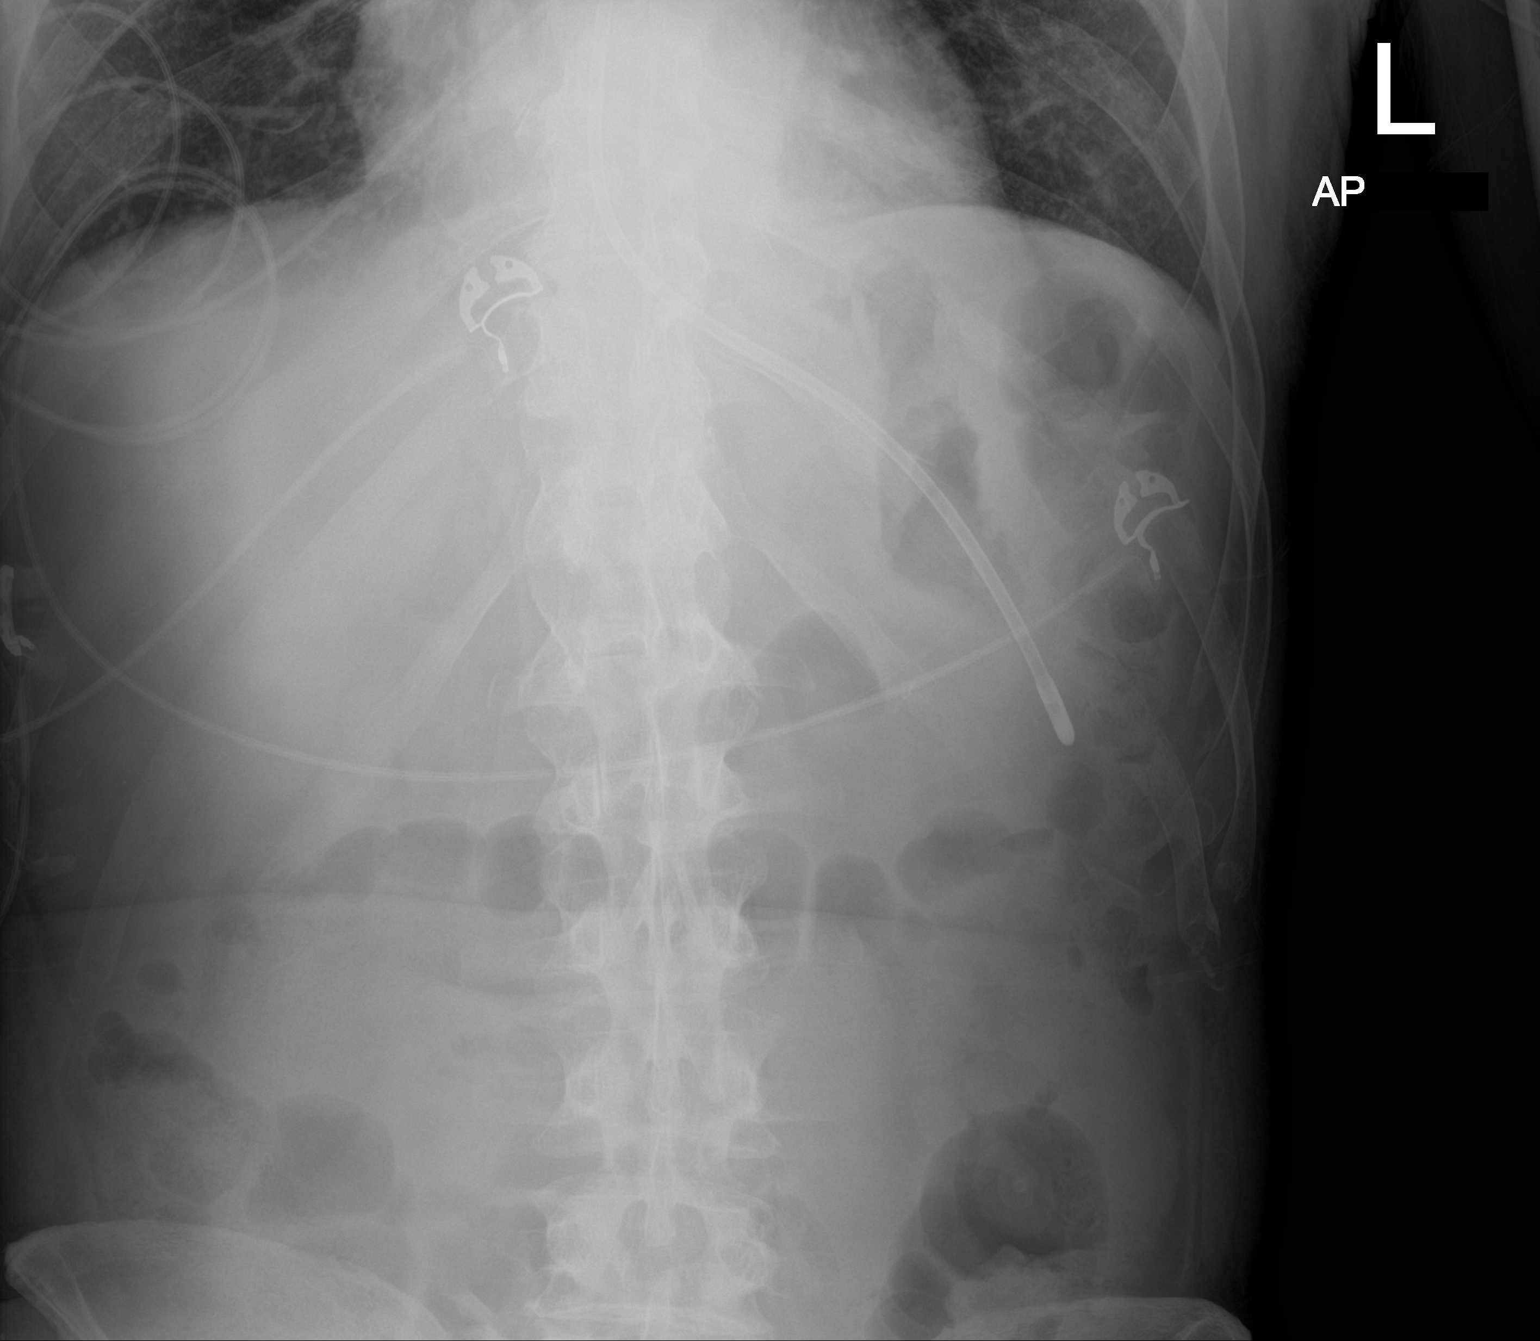

[1 of 1 positions shown; findings below may reference images not displayed]

FINDINGS: Incomplete assessment of the pelvis. Enteric tube tip terminates
over the stomach. No dilated loops of bowel are seen. Linear
bibasilar opacities likely reflecting atelectasis. Degenerative
changes of the lumbar spine.
IMPRESSION: 1. Enteric tube tip terminates over the stomach.

## 2019-10-16 MED ORDER — POTASSIUM CHLORIDE 10 MEQ/100ML IV SOLN
10.0000 meq | INTRAVENOUS | Status: AC
  Administered 2019-10-16 (×3): 10 meq via INTRAVENOUS
  Filled 2019-10-16 (×4): qty 100

## 2019-10-16 MED ORDER — OSMOLITE 1.2 CAL PO LIQD
1000.0000 mL | ORAL | Status: DC
Start: 2019-10-16 — End: 2019-10-16

## 2019-10-16 MED ORDER — POLYETHYLENE GLYCOL 3350 17 G PO PACK
17.0000 g | PACK | Freq: Every day | ORAL | Status: DC
  Filled 2019-10-16: qty 1

## 2019-10-16 MED ORDER — SODIUM CHLORIDE 0.9 % IV SOLN
150.0000 mg | Freq: Two times a day (BID) | INTRAVENOUS | Status: DC
  Administered 2019-10-16 – 2019-10-27 (×22): 150 mg via INTRAVENOUS
  Filled 2019-10-16 (×24): qty 15

## 2019-10-16 MED ORDER — CLOPIDOGREL BISULFATE 75 MG PO TABS: 75.0000 mg | ORAL_TABLET | Freq: Every day | ORAL | Status: DC

## 2019-10-16 MED ORDER — NICOTINE 21 MG/24HR TD PT24: 21.0000 mg | MEDICATED_PATCH | Freq: Every day | TRANSDERMAL | Status: DC

## 2019-10-16 MED ORDER — LEVETIRACETAM IN NACL 1000 MG/100ML IV SOLN
1000.0000 mg | Freq: Two times a day (BID) | INTRAVENOUS | Status: DC
  Administered 2019-10-16 – 2019-10-27 (×22): 1000 mg via INTRAVENOUS
  Filled 2019-10-16 (×23): qty 100

## 2019-10-16 MED ORDER — CLOPIDOGREL BISULFATE 75 MG PO TABS
75.0000 mg | ORAL_TABLET | Freq: Every day | ORAL | Status: DC
  Administered 2019-10-18 – 2019-10-19 (×2): 75 mg
  Filled 2019-10-16 (×4): qty 1

## 2019-10-16 MED ORDER — ENOXAPARIN SODIUM 40 MG/0.4ML ~~LOC~~ SOLN
40.0000 mg | SUBCUTANEOUS | Status: AC
  Administered 2019-10-16 – 2019-10-24 (×7): 40 mg via SUBCUTANEOUS
  Filled 2019-10-16 (×7): qty 0.4

## 2019-10-16 MED ORDER — OSMOLITE 1.5 CAL PO LIQD
1000.0000 mL | ORAL | Status: AC
  Administered 2019-10-16 – 2019-10-21 (×5): 1000 mL
  Filled 2019-10-16 (×8): qty 1000

## 2019-10-16 MED ORDER — FREE WATER
150.0000 mL | Status: DC
  Administered 2019-10-16 – 2019-10-21 (×30): 150 mL

## 2019-10-16 MED ORDER — PROSOURCE TF PO LIQD
45.0000 mL | Freq: Every day | ORAL | Status: DC
  Administered 2019-10-18 – 2019-10-21 (×4): 45 mL
  Filled 2019-10-16 (×7): qty 45

## 2019-10-16 NOTE — Progress Notes (Signed)
LTM EEG discontinued - no skin breakdown at unhook.   

## 2019-10-16 NOTE — Progress Notes (Addendum)
PROGRESS NOTE    Tell Rozelle  ZDG:[id number] DOB: Sep 21, 1960 DOA: 10/10/2019 PCP: Patient, No Pcp Per    Brief Narrative:  Patient admitted to the hospital with working diagnosis of status epilepticus, in the setting of recent large left MCA CVA.  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  He also has hypertension, dyslipidemia and type II that is mellitus.  Patient has been a resident of assisted living facility.  On the day of admission he was found unconscious, laying down on his right side, and having tonic-clonic movements.  By the time he reached to the emergency room he continued to have right eye deviation and he continues right-sided tonic-clonic movements.  He was intubated for airway protection, received midazolam, and phenytoin. On his initial physical examination his blood pressure was 200/101, heart rate 92, respirate 18, oxygen saturation 100% on mechanical ventilation.  His lungs were clear to auscultation bilaterally, heart S1-S2, present rhythm, abdomen soft, patient was sedated.  Patient had further work-up with brain MRI which showed large territory chronic hemorrhagic infarct in the left frontal lobe.  Facial MRI with no deep infection.  Patient was placed empirically on broad-spectrum antibiotic to cover meningitis.  His seizures were controlled with phenytoin, lacosamide and Keppra. He was successfully liberated from mechanical ventilation on 8/11.  He had persistent neurologic deficit along with swallow dysfunction.  Transferred to [facility] 8/30  Assessment & Plan:   Principal Problem:   Status epilepticus ([place]) Active Problems:   Carotid artery stenosis   Seizure (HCC)   CVA (cerebrovascular accident due to intracerebral hemorrhage) (HCC)/ left frontal lobe   AKI (acute kidney injury) ([place])   Essential hypertension   Type 2 diabetes mellitus with hyperlipidemia ([place])   1. Status epilepticus in the  setting of chronic large left MCA distribution CVA. Patient transferred to the telemetry ward. Continue to have secretions in is oropharynx difficult to clear. Continue NPO due to swallow dysfunction. He removed NG tube this am. Wbc is down to 12. Urine culture positive for 20,000 CFU of Klebsiella. Follow up EEG today with no epileptitiform discharges.   Continue antiepileptic regimen per neurology recommendations : keppra, phenytoin and lacosamide. Continue close neuro checks, fall and aspiration precautions.   Will re-attempt NG tube for feedings and will continue speech therapy. If persistent swallow dysfunction, he may need gastric tube.   No clinical signs of CNS bacterial infection, will dc antibiotic therapy, meningitis ruled out.   Positive urinary retention, if continue to require in an out catheterization will plan to place a foley catheter.   2. AKI with hypokalemia. Renal function with serum cr down to 1,31 with K at 3,3 and serum bicarbonate at 22.   Continue hydration with balanced electrolyte solutions at 75 ml per H, add 40 meq Kcl IV x1. Follow with renal function and electrolytes in am. Avoid hypotension and nephrotoxic medications.   3. T2DM. Continue glucose cover and monitoring with insulin sliding scale. Patient not able to tolerate po due to swallow dysfunction.   4. HTN. Continue blood pressure monitoring, today blood pressure 138/64 mmHg. Not able to have enteral antihypertensive medications.   5. Tobacco abuse. Will add nicotine patch.   Patient continue to be at high risk for recurrent seizures and aspiration   Status is: Inpatient  Remains inpatient appropriate because:IV treatments appropriate due to intensity of illness or inability to take PO   Dispo: The patient is from: ALF  Anticipated d/c is to: SNF              Anticipated d/c date is: 3 days              Patient currently is not medically stable to d/c.   DVT prophylaxis: Enoxaparin     Code Status:    full  Family Communication:  I spoke with patient's brother at the bedside, we talked in detail about patient's condition, plan of care and prognosis and all questions were addressed.      Nutrition Status: Nutrition Problem: Inadequate oral intake Etiology: inability to eat Signs/Symptoms: NPO status Interventions: Tube feeding     Skin Documentation:     Consultants:   Neurology     Subjective: Patient non verbal but able to communicate with gestures, his brother is at the bedside and able to help with communication. Patient with mild dyspnea but no chest pain. Having difficulty managing the secretions.   Objective: Vitals:   10/15/19 2348 10/16/19 0415 10/16/19 0752 10/16/19 1242  BP: (!) 148/66 (!) 155/79 (!) 152/69 (!) 163/71  Pulse: 81 84 86 85  Resp: _0 Temp: 98.3 F (36.8 C) 98.7 F (37.1 C) 98.4 F (36.9 C) 99.5 F (37.5 C)  TempSrc: Oral Oral Oral Oral  SpO2: 95% 96% 94% 94%  Weight:      Height:        Intake/Output Summary (Last 24 hours) at 10/16/2019 1405 Last data filed at 10/16/2019 0100 Gross per 24 hour  Intake 644.25 ml  Output 1155 ml  Net -510.75 ml   Filed Weights   10/10/19 2300 10/11/19 0324 10/12/19 0500  Weight: 68.7 kg 71.3 kg 71.4 kg    Examination:   General: Not in pain or dyspnea, deconditioned  Neurology: Awake and alert, right upper extremity paresis, non verbal   E ENT: no pallor, no icterus, oral mucosa moist Cardiovascular: No JVD. S1-S2 present, rhythmic, no gallops, rubs, or murmurs. No lower extremity edema. Pulmonary: positive breath sounds bilaterally Gastrointestinal. Abdomen soft and non tender Skin. No rashes Musculoskeletal: no joint deformities     Data Reviewed: I have personally reviewed following labs and imaging studies  CBC: Recent Labs  Lab 10/10/19 2248 10/10/19 2251 10/11/19 1151 10/13/19 0412 10/14/19 0816 10/15/19 0426 10/16/19 0154  WBC 19.6*   < > 15.5*  15.4* 16.6* 16.6* 12.0*  NEUTROABS 16.7*  --  13.3*  --   --   --   --   HGB 11.7*   < > 10.7* 9.1* 9.3* 8.6* 8.5*  HCT 34.5*   < > 30.6* 27.0* 27.7* 25.6* 25.2*  MCV 85.0   < > 85.0 87.9 87.7 86.2 85.7  PLT 329   < > 293 239 267 306 328   < > = values in this interval not displayed.   Basic Metabolic Panel: Recent Labs  Lab 10/11/19 0327 10/11/19 0554 10/12/19 0921 10/12/19 1425 10/12/19 1757 10/13/19 0412 10/13/19 1754 10/14/19 0816 10/15/19 0426 10/16/19 0154  NA 135   < > 138  --   --  138  --  138 143 139  K 4.3   < > 3.2*  --   --  3.4*  --  3.4* 3.1* 3.3*  CL 101   < > 102  --   --  106  --  106 109 107  CO2 17*   < > 20*  --   --  18*  --  20*  23 22  GLUCOSE 292*   < > 147*  --   --  152*  --  206* 123* 110*  BUN 29*   < > 28*  --   --  27*  --  29* 21* 18  CREATININE 1.63*   < > 1.89*  --   --  1.85*  --  1.70* 1.53* 1.31*  CALCIUM 9.1   < > 8.6*  --   --  8.4*  --  8.4* 8.6* 8.5*  MG 1.7  --   --  1.9 1.8 1.7 2.0  --   --   --   PHOS 3.4  --   --  3.5 3.5 3.0 3.0  --   --   --    < > = values in this interval not displayed.   GFR: Estimated Creatinine Clearance: 62.1 mL/min (A) (by C-G formula based on SCr of 1.31 mg/dL (H)). Liver Function Tests: Recent Labs  Lab 10/10/19 2248 10/16/19 0154  AST 23  --   ALT 21  --   ALKPHOS 118  --   BILITOT 0.6  --   PROT 8.2*  --   ALBUMIN 4.1 2.5*   No results for input(s): LIPASE, AMYLASE in the last 168 hours. No results for input(s): AMMONIA in the last 168 hours. Coagulation Profile: Recent Labs  Lab 10/10/19 2248  INR 1.1   Cardiac Enzymes: No results for input(s): CKTOTAL, CKMB, CKMBINDEX, TROPONINI in the last 168 hours. BNP (last 3 results) No results for input(s): PROBNP in the last 8760 hours. HbA1C: No results for input(s): HGBA1C in the last 72 hours. CBG: Recent Labs  Lab 10/15/19 2005 10/16/19 0041 10/16/19 0418 10/16/19 0755 10/16/19 1245  GLUCAP 114* 124* 98 107* 142*   Lipid  Profile: Recent Labs    10/13/19 2305  TRIG 71   Thyroid Function Tests: No results for input(s): TSH, T4TOTAL, FREET4, T3FREE, THYROIDAB in the last 72 hours. Anemia Panel: No results for input(s): VITAMINB12, FOLATE, FERRITIN, TIBC, IRON, RETICCTPCT in the last 72 hours.    Radiology Studies: I have reviewed all of the imaging during this hospital visit personally     Scheduled Meds: . amLODipine  10 mg Per Tube Daily  . aspirin  150 mg Rectal Daily  . chlorhexidine gluconate (MEDLINE KIT)  15 mL Mouth Rinse BID  . Chlorhexidine Gluconate Cloth  6 each Topical Daily  . [START ON 10/17/2019] clopidogrel  75 mg Per Tube Daily  . docusate  100 mg Per Tube BID  . feeding supplement (PROSource TF)  45 mL Per Tube Daily  . free water  150 mL Per Tube Q4H  . heparin  5,000 Units Subcutaneous Q8H  . insulin aspart  0-15 Units Subcutaneous Q4H  . losartan  25 mg Per Tube Daily  . phenytoin (DILANTIN) IV  100 mg Intravenous Q8H  . [START ON 10/17/2019] polyethylene glycol  17 g Per Tube Daily   Continuous Infusions: . sodium chloride Stopped (10/15/19 1713)  . sodium chloride Stopped (10/15/19 1716)  . cefTRIAXone (ROCEPHIN)  IV 2 g (10/16/19 1206)  . feeding supplement (OSMOLITE 1.5 CAL)    . feeding supplement (VITAL AF 1.2 CAL) Stopped (10/14/19 0600)  . lacosamide (VIMPAT) IV 100 mg (10/16/19 1109)  . levETIRAcetam 750 mg (10/16/19 1248)  . vancomycin 100 mL/hr at 10/15/19 1800     LOS: 5 days        Michaelia Beilfuss Gerome Apley, MD

## 2019-10-16 NOTE — Progress Notes (Signed)
Brother [name] in to visit with patient; requesting the mitten from the left hand be removed so patient could hold the suction himself to clear his mouth.  Patient quickly proceeded to pull cortrax out of his nares.  Brother states, "he did it so quick". Prior to mitten removal, I explained the mitten was placed for safety of his tubes and reminder for the patient not to attempt to remove his tubes. Patient in no acute distress; cortrax team and MD notified. Mitten replaced.

## 2019-10-16 NOTE — Procedures (Addendum)
Patient Name:Corey Bennett XBM:[id number] Epilepsy Attending:Minka Knight Annabelle Harman Referring Physician/Provider:Dr Lindie Spruce Duration:8/12/20211553 to8/13/2021 0272  Patient history:58 y.o.malewith past medical history significant for diabetes mellitus, hypertension, hyperlipidemia, large left MCA stroke with residual mild aphasia and right hemiparesis in April 2021, status post left CEAon 7/2021presents with focal status epilepticus characterized by right facial twitching and right upper extremity twitching along with right forced gaze deviation.EEG to evaluate for seizure  Level of alertness:awake, asleep  AEDs during EEG study:LEV,PHT, vimpat  Technical aspects: This EEG study was done with scalp electrodes positioned according to the 10-20 International system of electrode placement. Electrical activity was acquired at a sampling rate of 500Hz  and reviewed with a high frequency filter of 70Hz  and a low frequency filter of 1Hz . EEG data were recorded continuously and digitally stored.   Description: No posterior dominant rhythm was seen. Sleep was characterized by vertex waves, maximal frontocentral region. EEG showed continuous generalized polymoprhic frequencies with predominantly 5-8Hz  theta-alpha activity as well as intermittently 2-3Hz  delta activity. Patient was noted to have semi rhythmic right arm jerkingabout once every few minutes on avg. Concomiatbt eeg before, during and after the event didn't show any eeg change. Hyperventilation and photic stimulation were not performed.   ABNORMALITY -Continuousslow, generalized   IMPRESSION: This studyis suggestive ofmild to moderate diffuse encephalopathy, nonspecific etiology.No epileptiform discharges were seen throughout the recording. Patient was noted to have semi rhythmic right arm jerkingabout once every few  minutes on avg without concomitant eeg change. However, focal motor seizures may not be seen  on scalp eeg. Therefore, clinical correlation is recommended.   Corey Bennett 

## 2019-10-16 NOTE — Progress Notes (Signed)
Cortrax team returned to replace Cortrax; new placement in right nares at 66cm at nares; post xray was indicated and ordered; results verified with MD; Cortrax ready for use per Dr.Arrien and Cortrax team.

## 2019-10-16 NOTE — Progress Notes (Signed)
  Speech Language Pathology Treatment: Dysphagia  Patient Details Name: Corey Bennett MRN: [id number] DOB: September 12, 1960 Today's Date: 10/16/2019 Time: 8546-2703 SLP Time Calculation (min) (ACUTE ONLY): 12 min  Assessment / Plan / Recommendation Clinical Impression  Pt has mildly increased verbal output (still c/b apraxic errors) and follows a few commands today, but still cannot cough or swallow to command. Audible secretions are heard at baseline and RN has had to suction him several times today. Thick secretions were removed from his oral cavity before proceeding with POs. Immediate and delayed coughing is consistently elicited with PO trials, but he cannot expectorate anything even with assistance from the yankauer. Continue to recommend NPO with use of temporary alternative means of nutrition.    HPI HPI: Pt is a 59 yo male admitted from ALF with status epilepticus in the setting of recent large L MCA stroke, requiring intubation 8/8-8/11. Pt was evaluated by OP SLP 7/26 (after completing CIR in [place] and [facility] SLP), noting "receptive and expressive aphasia (likely severe) and some degree of verbal apraxia." He did not continue with SLP when he got a speech generating device. PMH: CVA with aphasia and hemiparesis, HTN, hypercholesterolemia, diabetes      SLP Plan  Continue with current plan of care       Recommendations  Diet recommendations: NPO Medication Administration: Via alternative means                Oral Care Recommendations: Oral care QID Follow up Recommendations: Skilled Nursing facility SLP Visit Diagnosis: Dysphagia, unspecified (R13.10) Plan: Continue with current plan of care       GO                [name]., M.A. CCC-SLP Acute Rehabilitation Services Pager [phone] Office [phone]  10/16/2019, 1:07 PM

## 2019-10-16 NOTE — Progress Notes (Addendum)
Interval events / Subjective:  cEEG negative for seizures  Vimpat started (200 mg load at 1 PM, followed by 100 mg BID started last night) Continues to have right arm twitching, which is stable in frequency as confirmed with his brother  ROS: Unable to obtain due to speech disturbance  Examination  Vital signs in last 24 hours: Temp:  [98.1 F (36.7 C)-99 F (37.2 C)] 98.7 F (37.1 C) (08/13 0415) Pulse Rate:  [65-112] 84 (08/13 0415) Resp:  [16-32] 19 (08/13 0415) BP: (137-178)/(60-141) 155/79 (08/13 0415) SpO2:  [95 %-100 %] 96 % (08/13 0415)  General: lying in bed,not in apparent distress CVS: pulse-normal rate and rhythm RS: breathing comfortably,CTAB Extremities: normal,warm Neuro: Awake, alert, attempting to speak but difficult to understand what patient is saying, did say hi, follows simple commands like sticking out his tongue but perseverates on prior commands, pupils equally round and reactive, able to track examiner in room, antigravity strength in all 4 extremities (left more than right).  Intermittent rhythmic right upper extremity flexion/extension at elbow movements  Basic Metabolic Panel: Recent Labs  Lab 10/11/19 0327 10/11/19 0554 10/12/19 0921 10/12/19 0921 10/12/19 1425 10/12/19 1757 10/13/19 0412 10/13/19 0412 10/13/19 1754 10/14/19 0816 10/15/19 0426 10/16/19 0154  NA 135   < > 138  --   --   --  138  --   --  138 143 139  K 4.3   < > 3.2*  --   --   --  3.4*  --   --  3.4* 3.1* 3.3*  CL 101   < > 102  --   --   --  106  --   --  106 109 107  CO2 17*   < > 20*  --   --   --  18*  --   --  20* 23 22  GLUCOSE 292*   < > 147*  --   --   --  152*  --   --  206* 123* 110*  BUN 29*   < > 28*  --   --   --  27*  --   --  29* 21* 18  CREATININE 1.63*   < > 1.89*  --   --   --  1.85*  --   --  1.70* 1.53* 1.31*  CALCIUM 9.1   < > 8.6*   < >  --   --  8.4*   < >  --  8.4* 8.6* 8.5*  MG 1.7  --   --   --  1.9 1.8 1.7  --  2.0  --   --   --   PHOS 3.4  --    --   --  3.5 3.5 3.0  --  3.0  --   --   --    < > = values in this interval not displayed.    CBC: Recent Labs  Lab 10/10/19 2248 10/10/19 2251 10/11/19 1151 10/13/19 0412 10/14/19 0816 10/15/19 0426 10/16/19 0154  WBC 19.6*   < > 15.5* 15.4* 16.6* 16.6* 12.0*  NEUTROABS 16.7*  --  13.3*  --   --   --   --   HGB 11.7*   < > 10.7* 9.1* 9.3* 8.6* 8.5*  HCT 34.5*   < > 30.6* 27.0* 27.7* 25.6* 25.2*  MCV 85.0   < > 85.0 87.9 87.7 86.2 85.7  PLT 329   < > 293 239 267 306 328   < > =  values in this interval not displayed.     Coagulation Studies: No results for input(s): LABPROT, INR in the last 72 hours.    Results for Corey, [name] (MRN [id number]) as of 10/16/2019 07:22  Ref. Range 10/14/2019 15:19 10/15/2019 04:26 10/16/2019 01:54  Phenytoin Lvl Latest Ref Range: 10.0 - 20.0 ug/mL 14.1 13.4 12.6    Imaging No new brain imaging overnight  ASSESSMENT AND PLAN: 59 year old male with left MCA stroke who presented with focal convulsive status epilepticus which improved after Keppra, Dilantin and propofol. With treatment of UTI, patient's fever curve and leukocytosis have been improving. Phenytoin level has been dropping with improving metabolic function (it is both renally and hepatically cleared, and there is room to increase this if necessary).   Unfortunately, patient continues to have intermittent rhythmic flexion extension movement at right elbow during examination concerning for focal motor status epilepticus.  Given that this is not detectable on EEG, it is suggested that there is a very small area of cortex that is involved and likely does not explain his increased difficulty with speaking and swallowing, as if that much of his cortex were involved I would expect to see an EEG correlate.   Focal motor status is notoriously difficult to treat, and longer goes on the more difficult it can be to break.  Given that the patient had minimal use of that right arm even prior to  this event, the risks of allowing the focal motor status to continue and eventually burnout are lower than the risks of using such high doses of antiepileptic agents that intubation would be required.  To attempt to better control this activity, we will increase Keppra (which is also appropriate given his improving renal function) as well as increasing Vimpat.  The goal is not necessarily to break the focal motor status but to try to prevent spread and secondary generalization.  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  I have spent a total of45 minuteswith the patient reviewing hospitalnotes,  test results, labs and examining the patient as well as establishing an assessment and plan that was discussed personally with the patient's team and patient.>50% of time was spent in direct patient care.  Brooke Dare MD-PhD [facility] [phone]   For questions after 7pm please refer to AMION to reach the Neurologist on call

## 2019-10-16 NOTE — Progress Notes (Signed)
Nutrition Follow-up  DOCUMENTATION CODES:   Not applicable  INTERVENTION:   Initiate Osmolite 1.5 @ 25 ml/hr via cortrak tube and increase by 10 ml every 4 hours to goal rate of 55 ml/hr.   45 ml Prosource TF daily.    150 ml free water flush every 4 hours  Tube feeding regimen provides 2020 kcal (100% of needs), 94 grams of protein, and 1006 ml of H2O. Total free water: 1906 ml daily  NUTRITION DIAGNOSIS:   Inadequate oral intake related to inability to eat as evidenced by NPO status.  Ongoing  GOAL:   Patient will meet greater than or equal to 90% of their needs  Progressing; to initiate TF today  MONITOR:   Diet advancement, Labs, Weight trends, Skin, I & O's  REASON FOR ASSESSMENT:   Ventilator    ASSESSMENT:   59 y.o. 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  He was intubated for airway protection and status and PCCM consulted for admission  8/11- extubated 8/12- s/p BSE- recommend continue NPO 8/13- cortrak tube placed (tip of tube in stomach), TF initiated  Reviewed I/O's: -550 ml x 24 hours and +5.2 L since admission  UOP: 1.7 L x 24 hours  Case discussed with cortrak tube team; cortak tube has been placed (tip of tube confirmed in the stomach).   Pt sitting up in bed at time of visit. He turned his head toward this RD when being addressed, however, unable to answer questions. NO family at bedside at time of visit.   Case discussed with Dr. Ella Jubilee; received verbal permission to start TF now that pt has feeding access.   Medications reviewed and include dilantin.   Labs reviewed: K: 3.3, CBGS: 114-126 (inpatient orders for glycemic control are 0-15 units inuslin aspart every 4 hours).   Diet Order:   Diet Order            Diet NPO time specified  Diet effective now                 EDUCATION NEEDS:   No education needs have been  identified at this time  Skin:  Skin Assessment: Reviewed RN Assessment  Last BM:  10/16/19  Height:   Ht Readings from Last 1 Encounters:  10/10/19 6\' 1"  (1.854 m)    Weight:   Wt Readings from Last 1 Encounters:  10/12/19 71.4 kg    Ideal Body Weight:  83.6 kg  BMI:  Body mass index is 20.77 kg/m.  Estimated Nutritional Needs:   Kcal:  1950-2150  Protein:  90-105 grams  Fluid:  > 1.9 L    12/12/19, RD, LDN, CDCES Registered Dietitian II Certified Diabetes Care and Education Specialist Please refer to [facility] for RD and/or RD on-call/weekend/after hours pager

## 2019-10-16 NOTE — Procedures (Signed)
Cortrak  Person Inserting Tube:  Antonio Woodhams M, RD Tube Type:  Cortrak - 43 inches Tube Location:  Left nare Initial Placement:  Stomach Secured by: Bridle Technique Used to Measure Tube Placement:  Documented cm marking at nare/ corner of mouth Cortrak Secured At:  66 cm Procedure Comments:  Cortrak Tube Team Note:  Consult received to place a Cortrak feeding tube.   No x-ray is required. RN may begin using tube.    If the tube becomes dislodged please keep the tube and contact the Cortrak team at [email or web address] ([email or web address]) for replacement.  If after hours and replacement cannot be delayed, place a NG tube and confirm placement with an abdominal x-ray.     Corey Laube, MS, RD, LDN, CNSC Inpatient Clinical Dietitian RD pager # available in AMION  After hours/weekend pager # available in AMION      

## 2019-10-17 ENCOUNTER — Inpatient Hospital Stay (HOSPITAL_COMMUNITY): Payer: BC Managed Care – PPO

## 2019-10-17 LAB — BASIC METABOLIC PANEL
Anion gap: 14 (ref 5–15)
BUN: 19 mg/dL (ref 6–20)
CO2: 21 mmol/L — ABNORMAL LOW (ref 22–32)
Calcium: 8.4 mg/dL — ABNORMAL LOW (ref 8.9–10.3)
Chloride: 103 mmol/L (ref 98–111)
Creatinine, Ser: 1.56 mg/dL — ABNORMAL HIGH (ref 0.61–1.24)
GFR calc Af Amer: 56 mL/min — ABNORMAL LOW (ref >60)
GFR calc non Af Amer: 48 mL/min — ABNORMAL LOW (ref >60)
Glucose, Bld: 216 mg/dL — ABNORMAL HIGH (ref 70–99)
Potassium: 3.8 mmol/L (ref 3.5–5.1)
Sodium: 138 mmol/L (ref 135–145)

## 2019-10-17 LAB — GLUCOSE, CAPILLARY
Glucose-Capillary: 208 mg/dL — ABNORMAL HIGH (ref 70–99)
Glucose-Capillary: 147 mg/dL — ABNORMAL HIGH (ref 70–99)
Glucose-Capillary: 212 mg/dL — ABNORMAL HIGH (ref 70–99)
Glucose-Capillary: 220 mg/dL — ABNORMAL HIGH (ref 70–99)
Glucose-Capillary: 212 mg/dL — ABNORMAL HIGH (ref 70–99)
Glucose-Capillary: 174 mg/dL — ABNORMAL HIGH (ref 70–99)

## 2019-10-17 LAB — BLOOD GAS, ARTERIAL
Acid-base deficit: 0.3 mmol/L (ref 0.0–2.0)
Bicarbonate: 23 mmol/L (ref 20.0–28.0)
Drawn by: 59156
FIO2: 80
O2 Saturation: 96.4 %
Patient temperature: 37.4
pCO2 arterial: 32.6 mmHg (ref 32.0–48.0)
pH, Arterial: 7.463 — ABNORMAL HIGH (ref 7.350–7.450)
pO2, Arterial: 81.2 mmHg — ABNORMAL LOW (ref 83.0–108.0)

## 2019-10-17 LAB — POCT I-STAT 7, (LYTES, BLD GAS, ICA,H+H)
Acid-base deficit: 5 mmol/L — ABNORMAL HIGH (ref 0.0–2.0)
Bicarbonate: 19.6 mmol/L — ABNORMAL LOW (ref 20.0–28.0)
Calcium, Ion: 1.17 mmol/L (ref 1.15–1.40)
HCT: 25 % — ABNORMAL LOW (ref 39.0–52.0)
Hemoglobin: 8.5 g/dL — ABNORMAL LOW (ref 13.0–17.0)
O2 Saturation: 98 %
Patient temperature: 99.9
Potassium: 3.8 mmol/L (ref 3.5–5.1)
Sodium: 140 mmol/L (ref 135–145)
TCO2: 21 mmol/L — ABNORMAL LOW (ref 22–32)
pCO2 arterial: 36.7 mmHg (ref 32.0–48.0)
pH, Arterial: 7.339 — ABNORMAL LOW (ref 7.350–7.450)
pO2, Arterial: 117 mmHg — ABNORMAL HIGH (ref 83.0–108.0)

## 2019-10-17 IMAGING — DX DG CHEST 1V PORT
2 series · 2 of 2 positions shown · non-contrast
Comparison: [DATE] earlier.

CLINICAL DATA: Intubation.

EXAM:
PORTABLE CHEST 1 VIEW

[chest ap (1 of 2)]
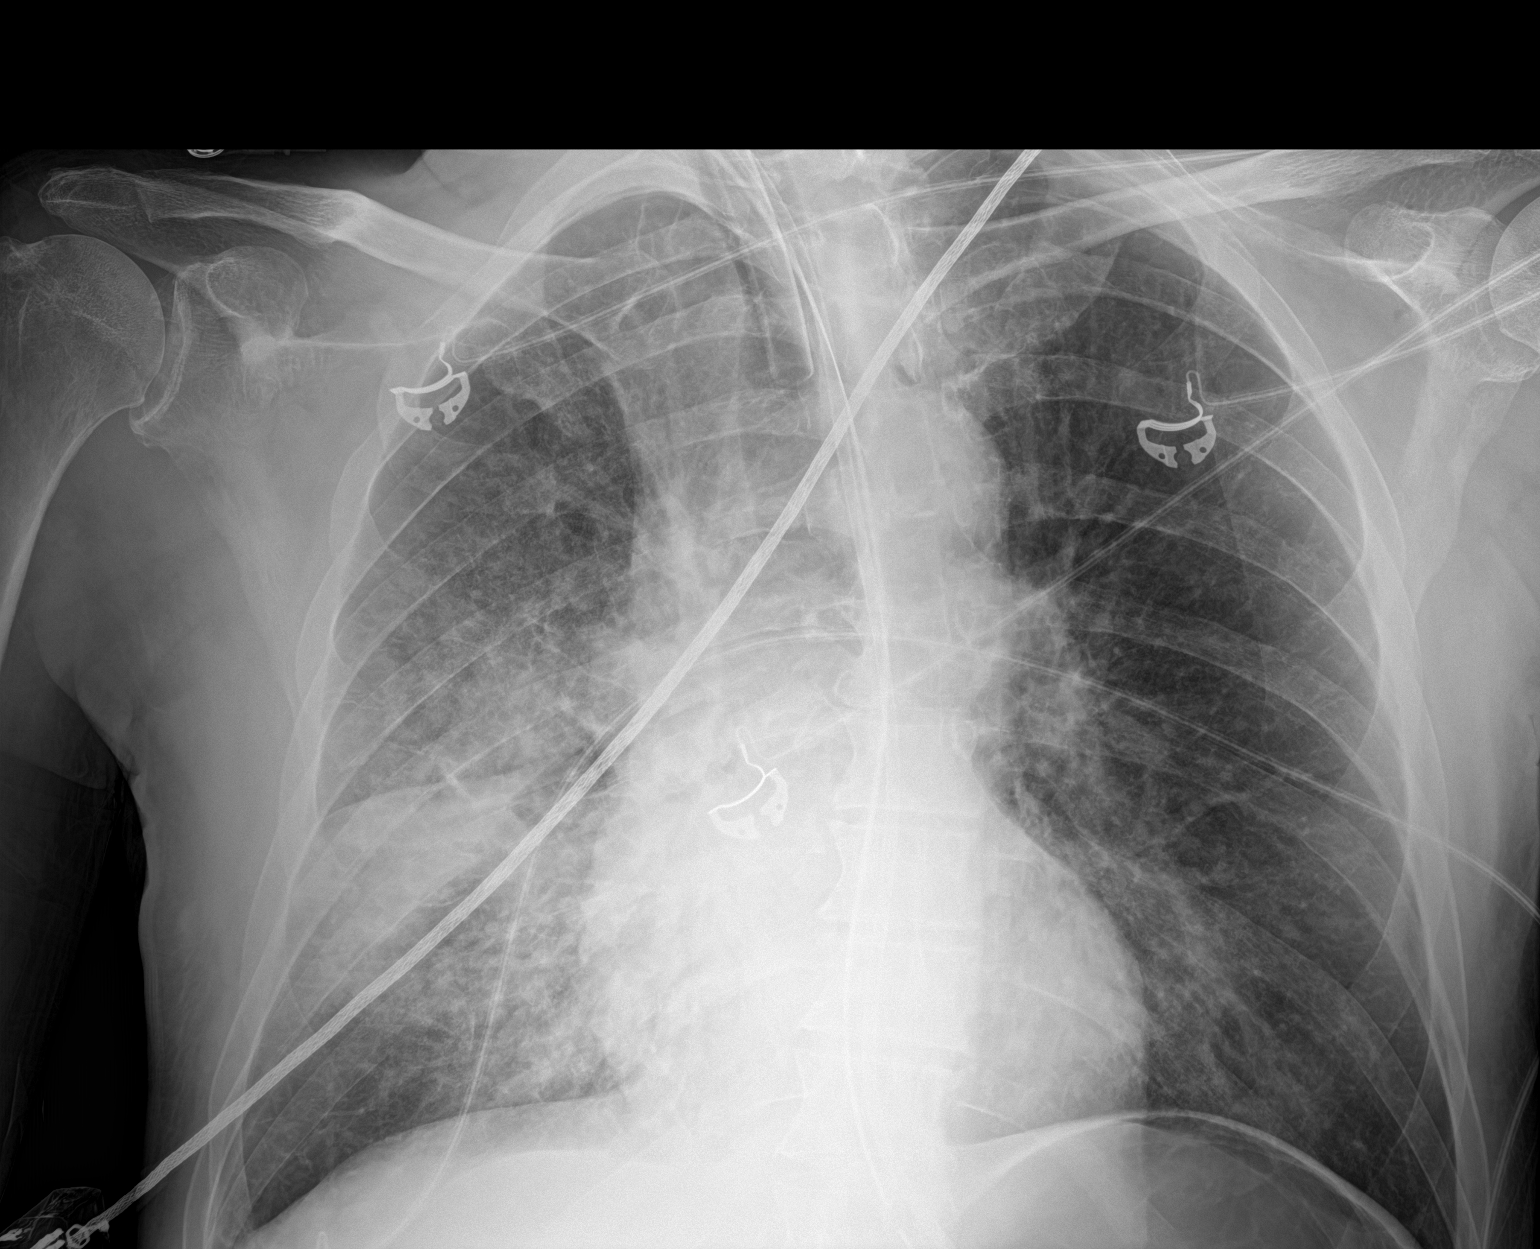

[chest ap (2 of 2)]
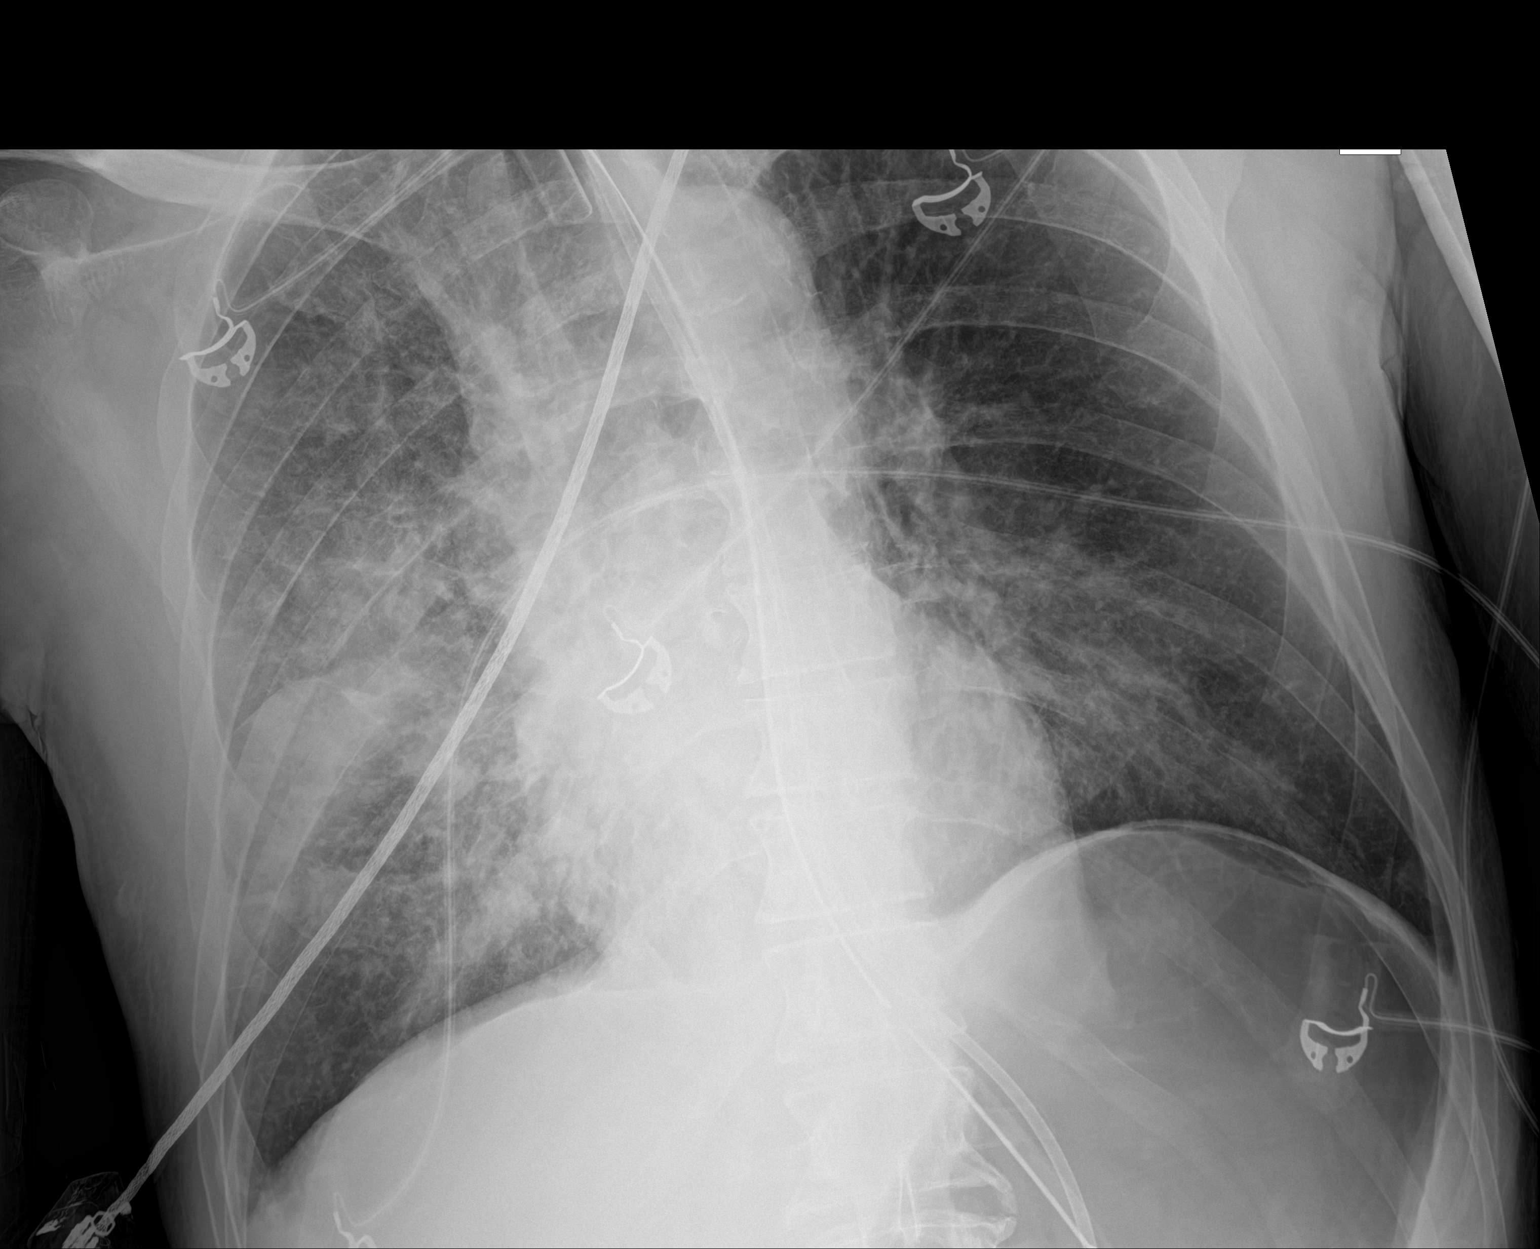

[2 of 2 positions shown; findings below may reference images not displayed]

FINDINGS: Patient is rotated to the right. Interval placement of endotracheal
tube with tip 6.3 cm above the carina. Two enteric tubes course into
the region of the stomach and off the film as tips are not
visualized.

Lungs are adequately inflated demonstrate persistent hazy bilateral
perihilar opacification right worse than left with interval
worsening of the right midlung. Findings are likely due to infection
and less likely asymmetric edema. No effusion. Cardiomediastinal
silhouette and remainder of the exam is unchanged.
IMPRESSION: 1. Persistent hazy bilateral perihilar opacification with interval
worsening of the right midlung. Findings are likely due to infection
and less likely asymmetric edema.
2. Tubes and lines as described.

## 2019-10-17 IMAGING — DX DG CHEST 1V PORT
1 series · 2 of 2 positions shown · non-contrast
Comparison: [DATE]

CLINICAL DATA: Acute respiratory distress

EXAM:
PORTABLE CHEST 1 VIEW

[Series 1: chest ap · 0.14mm/px · 2 of 2 slices shown]
[im 1/2]
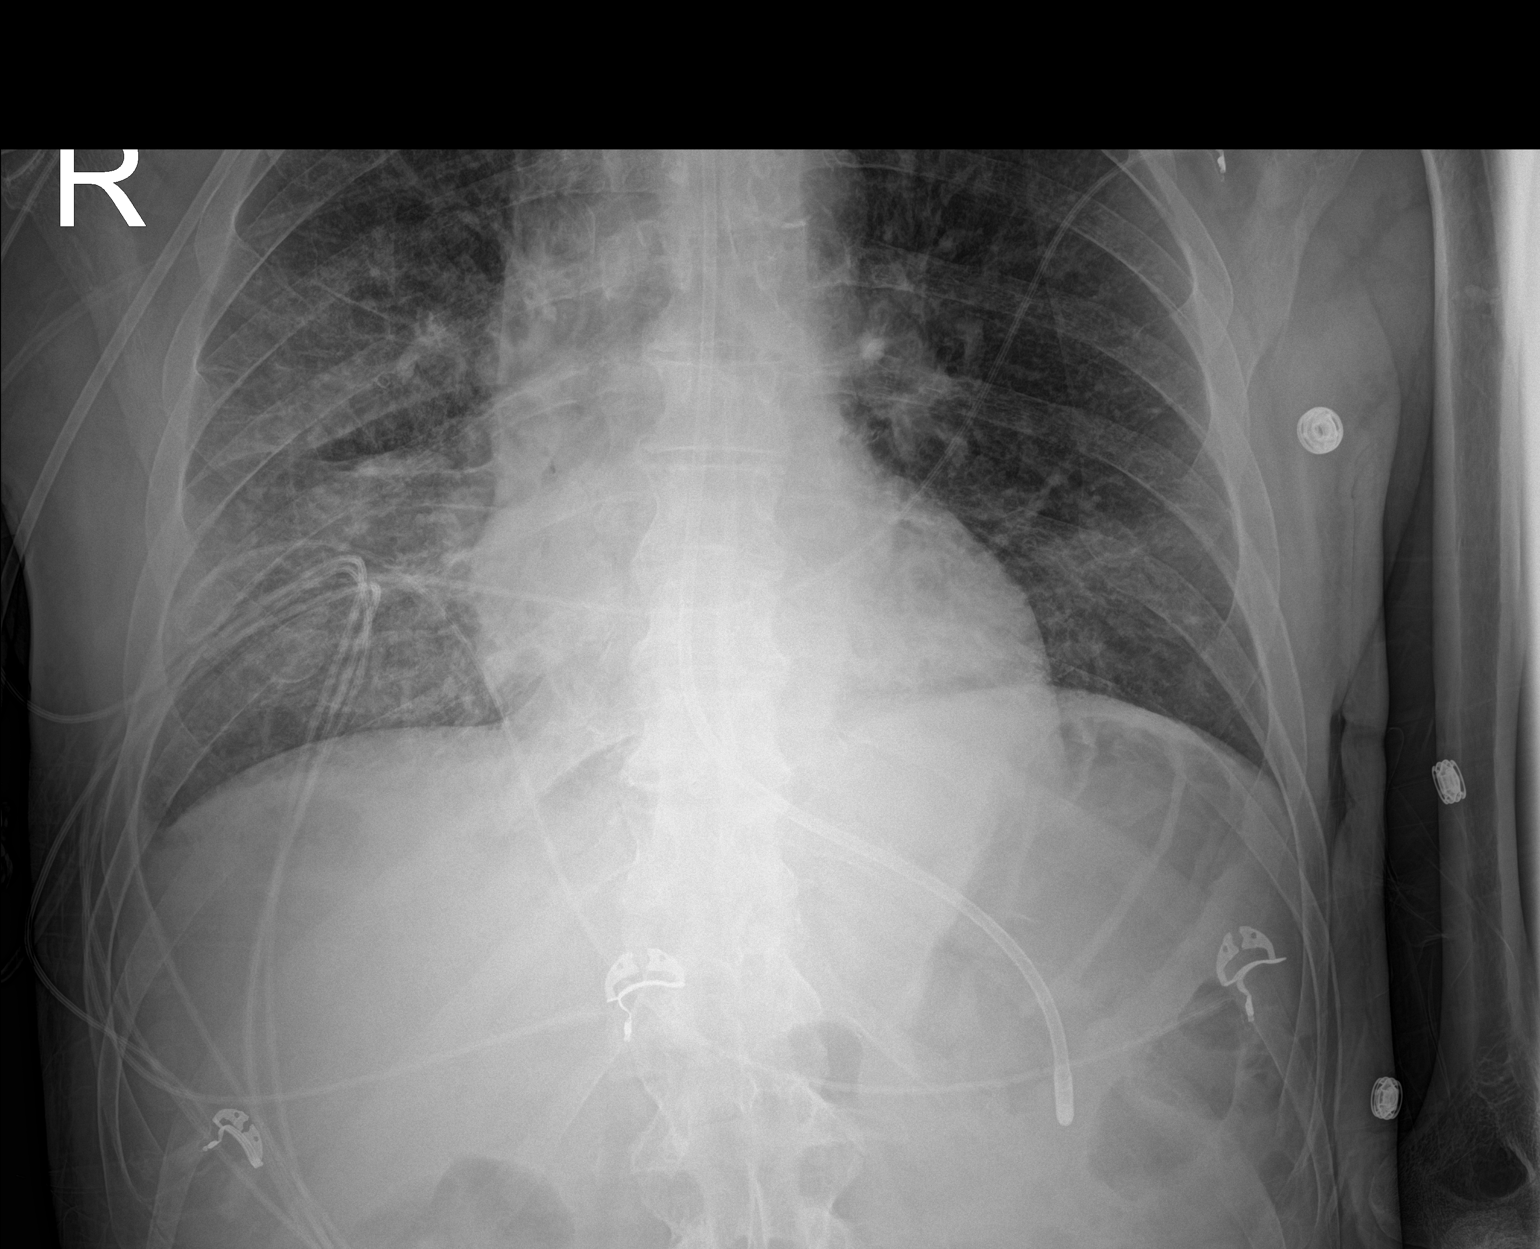
[im 2/2]
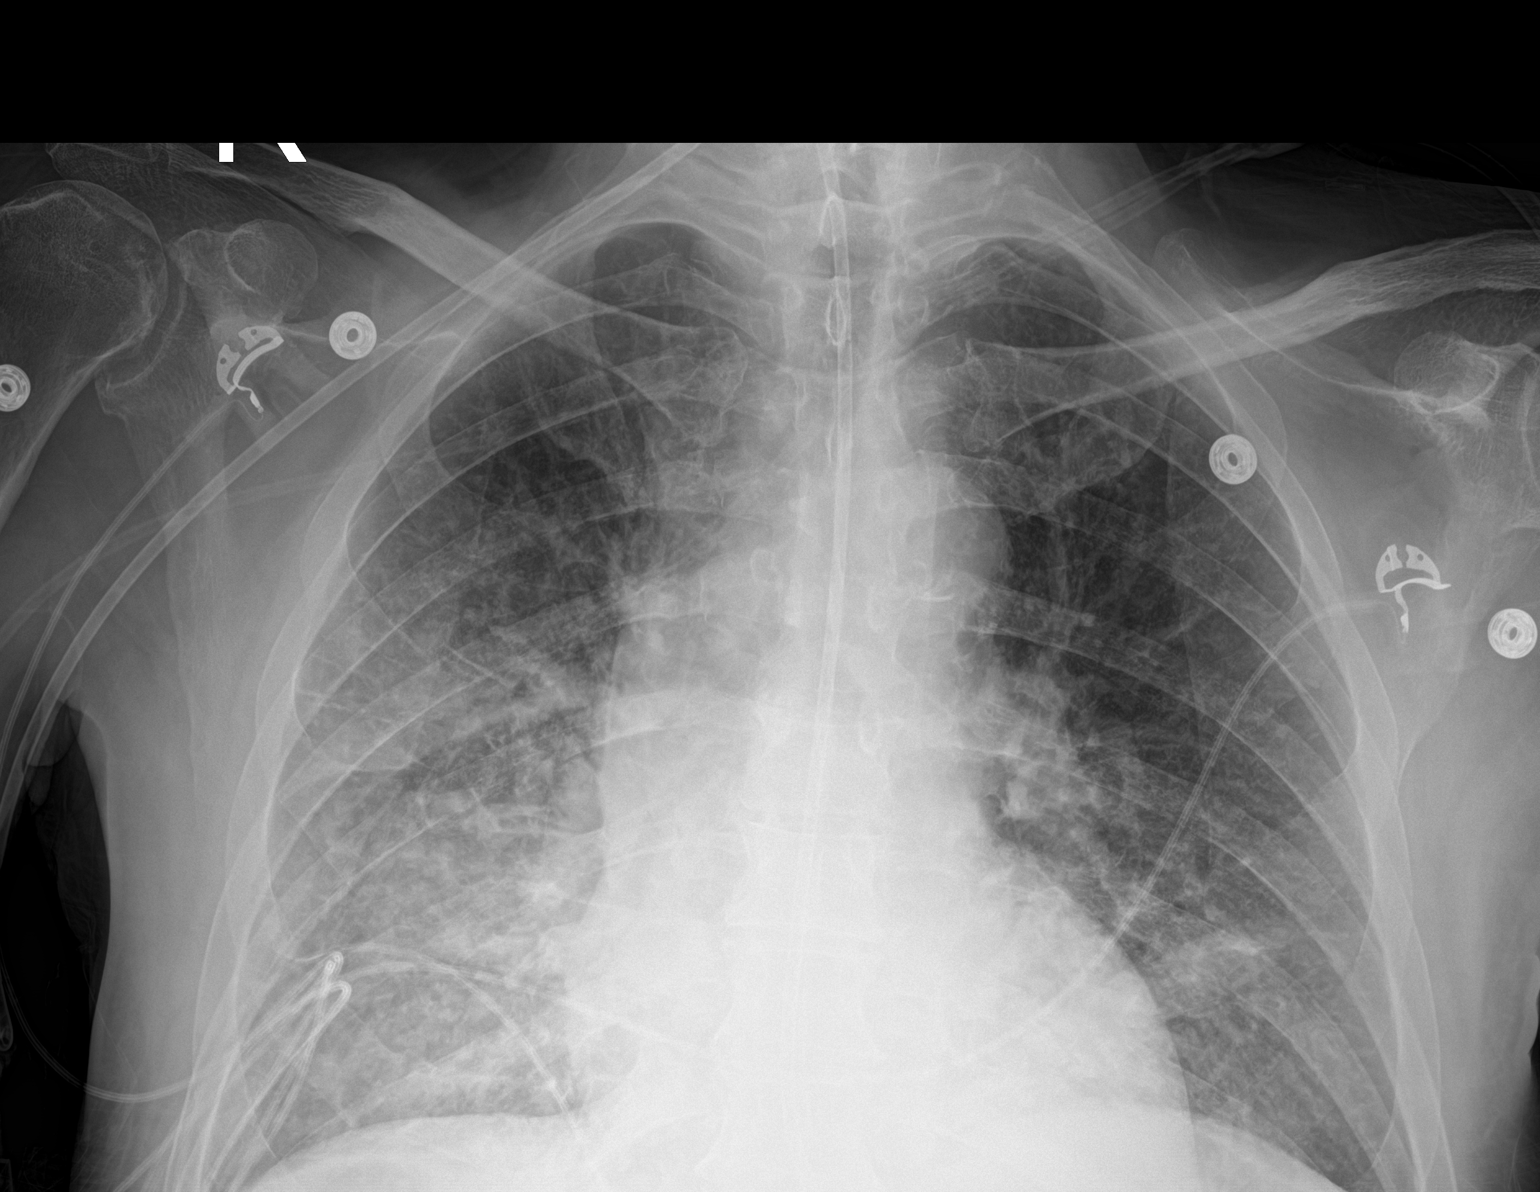

[2 of 2 positions shown; findings below may reference images not displayed]

FINDINGS: Endotracheal tube has been removed in the interval. Feeding catheter
is now seen in the stomach. Cardiac shadow is within normal limits.
Diffuse airspace opacity is noted throughout the mid and lower right
lung and to a lesser degree in the left lung base. These changes are
consistent with acute pneumonic infiltrate. No other focal
abnormality is seen.
IMPRESSION: Increasing bilateral infiltrates right greater than left.

## 2019-10-17 MED ORDER — LORAZEPAM 2 MG/ML IJ SOLN
2.0000 mg | INTRAMUSCULAR | Status: DC | PRN
  Administered 2019-10-17: 2 mg via INTRAVENOUS
  Filled 2019-10-17: qty 1

## 2019-10-17 MED ORDER — VANCOMYCIN HCL 500 MG/100ML IV SOLN
500.0000 mg | Freq: Two times a day (BID) | INTRAVENOUS | Status: DC
  Administered 2019-10-18: 500 mg via INTRAVENOUS
  Filled 2019-10-17 (×2): qty 100

## 2019-10-17 MED ORDER — SODIUM CHLORIDE 0.9 % IV SOLN
1.5000 g | Freq: Four times a day (QID) | INTRAVENOUS | Status: DC
  Filled 2019-10-17 (×3): qty 4

## 2019-10-17 MED ORDER — SODIUM CHLORIDE 0.9 % IV SOLN
250.0000 mL | INTRAVENOUS | Status: DC
  Administered 2019-10-17: 250 mL via INTRAVENOUS

## 2019-10-17 MED ORDER — POTASSIUM CHLORIDE 10 MEQ/100ML IV SOLN
10.0000 meq | Freq: Once | INTRAVENOUS | Status: AC
  Administered 2019-10-17: 10 meq via INTRAVENOUS

## 2019-10-17 MED ORDER — SODIUM CHLORIDE 0.9 % IV SOLN
3.0000 g | Freq: Four times a day (QID) | INTRAVENOUS | Status: DC
  Administered 2019-10-17: 3 g via INTRAVENOUS
  Filled 2019-10-17 (×3): qty 8

## 2019-10-17 MED ORDER — PROPOFOL 1000 MG/100ML IV EMUL
0.0000 ug/kg/min | INTRAVENOUS | Status: DC
  Administered 2019-10-17: 20 ug/kg/min via INTRAVENOUS
  Administered 2019-10-17: 25 ug/kg/min via INTRAVENOUS
  Administered 2019-10-18: 15 ug/kg/min via INTRAVENOUS
  Administered 2019-10-18 (×2): 25 ug/kg/min via INTRAVENOUS
  Administered 2019-10-19: 35 ug/kg/min via INTRAVENOUS
  Administered 2019-10-19: 15 ug/kg/min via INTRAVENOUS
  Filled 2019-10-17 (×3): qty 100
  Filled 2019-10-17: qty 200
  Filled 2019-10-17: qty 100

## 2019-10-17 MED ORDER — ORAL CARE MOUTH RINSE
15.0000 mL | OROMUCOSAL | Status: DC
  Administered 2019-10-17 – 2019-10-28 (×106): 15 mL via OROMUCOSAL

## 2019-10-17 MED ORDER — ROCURONIUM BROMIDE 50 MG/5ML IV SOLN
10.0000 mg | Freq: Once | INTRAVENOUS | Status: AC
  Administered 2019-10-17: 10 mg via INTRAVENOUS

## 2019-10-17 MED ORDER — VANCOMYCIN HCL IN DEXTROSE 1-5 GM/200ML-% IV SOLN
1000.0000 mg | Freq: Once | INTRAVENOUS | Status: AC
  Administered 2019-10-17: 1000 mg via INTRAVENOUS
  Filled 2019-10-17: qty 200

## 2019-10-17 MED ORDER — ATORVASTATIN CALCIUM 80 MG PO TABS
80.0000 mg | ORAL_TABLET | Freq: Every day | ORAL | Status: DC
  Administered 2019-10-17 – 2019-11-21 (×32): 80 mg
  Filled 2019-10-17 (×33): qty 1

## 2019-10-17 MED ORDER — SUCCINYLCHOLINE CHLORIDE 20 MG/ML IJ SOLN
75.0000 mg | Freq: Once | INTRAMUSCULAR | Status: AC
  Administered 2019-10-17: 75 mg via INTRAVENOUS

## 2019-10-17 MED ORDER — NOREPINEPHRINE 4 MG/250ML-% IV SOLN: 0.0000 ug/min | INTRAVENOUS | Status: DC

## 2019-10-17 MED ORDER — LACTATED RINGERS IV SOLN: INTRAVENOUS | Status: DC

## 2019-10-17 MED ORDER — PROPOFOL 1000 MG/100ML IV EMUL: 5.0000 ug/kg/min | INTRAVENOUS | Status: DC

## 2019-10-17 MED ORDER — ETOMIDATE 2 MG/ML IV SOLN
20.0000 mg | Freq: Once | INTRAVENOUS | Status: AC
  Administered 2019-10-17: 20 mg via INTRAVENOUS

## 2019-10-17 MED ORDER — CHLORHEXIDINE GLUCONATE 0.12% ORAL RINSE (MEDLINE KIT)
15.0000 mL | Freq: Two times a day (BID) | OROMUCOSAL | Status: DC
  Administered 2019-10-17 – 2019-11-21 (×66): 15 mL via OROMUCOSAL

## 2019-10-17 MED ORDER — NOREPINEPHRINE 4 MG/250ML-% IV SOLN: 2.0000 ug/min | INTRAVENOUS | Status: DC

## 2019-10-17 MED ORDER — ROCURONIUM BROMIDE 50 MG/5ML IV SOLN
40.0000 mg | Freq: Once | INTRAVENOUS | Status: AC
  Administered 2019-10-17: 40 mg via INTRAVENOUS

## 2019-10-17 MED ORDER — ASPIRIN 81 MG PO CHEW
81.0000 mg | CHEWABLE_TABLET | Freq: Every day | ORAL | Status: DC
  Administered 2019-10-17 – 2019-11-06 (×18): 81 mg
  Filled 2019-10-17 (×19): qty 1

## 2019-10-17 MED ORDER — PIPERACILLIN-TAZOBACTAM 3.375 G IVPB
3.3750 g | Freq: Three times a day (TID) | INTRAVENOUS | Status: AC
  Administered 2019-10-17 – 2019-10-24 (×23): 3.375 g via INTRAVENOUS
  Filled 2019-10-17 (×23): qty 50

## 2019-10-17 NOTE — Progress Notes (Signed)
Pt has worsening breath sounds and is unable to clear secretions. RT notified. Neuro MD present. NT suctioning performed by RT. Rapid Response team and MD notified. RN will continue to monitor.

## 2019-10-17 NOTE — Consult Note (Signed)
NAME:  Corey Bennett, MRN:  [id number], DOB:  1960/09/08, LOS: 6 ADMISSION DATE:10/10/2019, CONSULTATION DATE:10/17/19 REFERRING MD: Dr. Ella Jubilee, CHIEF COMPLAINT:seizure, aspiration and unable to protect airway Brief History   Parmvir monitor is a 59 year old male with past medical history of 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  Patient is living in a assisted living facility and can ambulate by himself.  Patient was found by staff to have a right sided twitching with right eye deviation.  He was given Versed and Dilantin and was intubated for airway protection.  Patient was extubated on 8/11 and his seizures was controlled with Keppra, phenytoin, and Vimpat.  He was transferred to [address] for medical management  Today 8/14, patient continued to have seizure with twitching of the right upper extremity.  2 mg of lorazepam was given.  Patient continued to desat in the 80s on 15 L of nonrebreather mask with altered mental status.  Patient was intubated to protect airway and transferred to the ICU.  Past Medical History  Left MCA stroke with residual aphasia and right hemiparesis CEA Hypertension Hypercholesterolemia Diabetes   Significant Hospital Events   8/8 admit to PCCM 8/11 extubation 8/14 reintubation  Consults:  Neurology PCCM  Procedures:  8/8 ETT 8/14 ETT  Significant Diagnostic Tests:  8/8 CT head>>No acute intracranial abnormality. Large area of encephalomalacia throughout the left MCA territory, consistent with remote infarct. 8/8 CXR>>lungs clear 8/8 UDS>>+ benzo (but given versed in ED); o/w negative 8/8 MRI of brain >> large territory chronic hemorrhagic infarct in the left frontal lobe.  Ill-defined hyperintensity throughout the left basal ganglia and left thalamus with mild enhancement in dobutamine likely secondary to encephalitis, infiltrating tumor or  atypical subacute infarction 8/8 MRI of face and trigeminal region >> rules out soft tissue abscess  Micro Data:  8/8 Sars-CoV-2>>negative 8/8 BCx2>> no growth 8/8 UC>>  Klebsiella pneumonia  Antimicrobials:  Vancomycin 8/8>> 8/12 Ceftriaxone 8/8>> 8/12 Ampicillin 8/8>>8/11 Unasyn 8/14 Interim history/subjective:  Patient is seen at bedside today.  He is altered and in acute respiratory distress.  He is intubated for airway protection and transferred to the ICU.  Objective   Blood pressure 116/63, pulse 99, temperature 99.6 F (37.6 C), temperature source Oral, resp. rate (!) 22, height 6\' 1"  (1.854 m), weight 75.2 kg, SpO2 (!) 89 %.        Intake/Output Summary (Last 24 hours) at 10/17/2019 1123 Last data filed at 10/16/2019 1753 Gross per 24 hour  Intake --  Output 1300 ml  Net -1300 ml   Filed Weights   10/11/19 0324 10/12/19 0500 10/17/19 0450  Weight: 71.3 kg 71.4 kg 75.2 kg    Physical Exam Constitutional:      General: Acute respiratory distress.  Satting in the 80s with 15 L nonrebreather mask.  Not able to follow command.     Resolved Hospital Problem list     Assessment & Plan:  Acute respiratory distress requiring mechanical ventilation Status epilepticus in the setting of recent large left MCA stroke Acute kidney injury Hypertension Diabetes  Plan:  Acute respiratory distress requiring mechanical ventilation Patient had an episode of seizure this morning with the right arm twitching.  2 mg of Ativan was given.  Patient begins to desat in the 80s on 15 L of nonrebreather mask.  PCCM was consulted for intubation to protect his airway. -PRVC, PEEP 8, respiratory 16, tidal volume 630,  FiO2  100 -Continue ventilator.  Wean as tolerated -Sedation with propofol -Oral suction with tube care -Daily SBT and SAT   Status epilepticus in the setting of recent large left MCA stroke Patient still has right arm twitching.  Neurology is following -Appreciate  neurology recommendation -Continue frequent neurochecks -Continue phenytoin, clobazam and Keppra    AKI Hypokalemia Baseline creatinine of 1.23. Renal ultrasound did not show any obstructions.  Creatinine worsening to 1.33 -1.56.    -Continue to trend creatinine   Recent dental infection MRI of the face shows no abscess.     Hypertension -Continue amlodipine 10 mg -Continue losartan 25 mg daily   Diabetes Refeeding syndrome Start insulin when tube feedings start   Best practice:  Diet: N.p.o. Pain/Anxiety/Delirium protocol (if indicated): Propofol VAP protocol (if indicated): HOB 30 degrees, suction prn DVT prophylaxis: Enoxaparin GI prophylaxis: protonix Glucose control: SSI, Lantus Mobility: bed rest Code Status: full code Family Communication:will update Disposition: ICU  Critical care time:20 min     Doran Stabler, DO Internal Medicine Residency My pager: [phone]

## 2019-10-17 NOTE — Progress Notes (Signed)
Patient remains unable to void since foley cath. Removal on 10-15-19. He has been in and out cath. X2; orders to place foley on third in and out cath.  Bladder scan at 1130 showing 840 ml residual urine; using sterile tech. Foley cath placed per order due to acute urinary retention.

## 2019-10-17 NOTE — Progress Notes (Signed)
Pt has been transferred to ICU unit. Pt belongings sent with them. MD and rapid response at bedside. Bedside report was conducted.

## 2019-10-17 NOTE — Progress Notes (Signed)
Phenytoin Consult Indication: status epilepticus  No Known Allergies  Patient Measurements: Height: '6\' 1"'  (185.4 cm) Weight: 75.2 kg (165 lb 12.6 oz) IBW/kg (Calculated) : 79.9 TPN AdjBW (KG): 68.7 Body mass index is 21.87 kg/m.  Vital signs: Temp: 99.6 F (37.6 C) (08/14 0810) Temp Source: Oral (08/14 0810) BP: 116/63 (08/14 0810) Pulse Rate: 99 (08/14 0810)  Labs: No results found for: ALBUMIN, PHENYTOIN, PHENYTFREE Lab Results  Component Value Date   PHENYTOIN 12.6 10/16/2019   Estimated Creatinine Clearance: 54.9 mL/min (A) (by C-G formula based on SCr of 1.56 mg/dL (H)).   Medications:  Scheduled:  . aspirin  150 mg Rectal Daily  . chlorhexidine gluconate (MEDLINE KIT)  15 mL Mouth Rinse BID  . Chlorhexidine Gluconate Cloth  6 each Topical Daily  . clopidogrel  75 mg Per Tube Daily  . docusate  100 mg Per Tube BID  . enoxaparin (LOVENOX) injection  40 mg Subcutaneous Q24H  . feeding supplement (PROSource TF)  45 mL Per Tube Daily  . free water  150 mL Per Tube Q4H  . insulin aspart  0-15 Units Subcutaneous Q4H  . phenytoin (DILANTIN) IV  100 mg Intravenous Q8H  . polyethylene glycol  17 g Per Tube Daily   Assessment: Corrected phenytoin level (10/16/19): 21, slightly supratherapeutic Seizure activity: none noted Significant potential drug interactions: none  Phenytoin loaded on 8/8, current maintenance dose 100 mg TID.  Last phenytoin level was about 36 hours ago. Pt reaching steady state today after ~5 days on phenytoin.  Pt remains on Keppra and Vimpat. Plan today for Ativan IV challenge due to possible worsening focal motor status per Neurology.   Goals of care:  Total phenytoin level: 15-20 mcg/ml per Neurology Free phenytoin level: 1-2 mcg/ml  Plan:  Continue IV phenytoin 100 mg TID as phenytoin level continues to downtrend appropriately Check steady state phenytoin level tomorrow at [place] will continue to follow regarding obtaining total  phenytoin levels and dose adjustments as indicated.  Fara Olden, PharmD PGY-1 Pharmacy Resident 10/17/2019 9:49 AM  Please check [email or web address] for unit-specific pharmacy phone numbers.

## 2019-10-17 NOTE — Progress Notes (Addendum)
Patient continue to have seizures, right upper extremity, in order to prevent further generalized epileptic activity he received  Lorazepam 2 mg. His respiratory status rapidly deteriorated, positive increased work of breathing and persistent oxygen desaturation 86% on 15 L nonrebreather mask.  He continued to have persistent altered mentation, he opens his eyes to pain stimuli, not following commands, positive accessory muscle use, positive bilateral rhonchi, abdomen soft, no lower extremity edema.  Patient will be transferred to the intensive care unit, patient with imminent respiratory arrest.  Will need invasive mechanical ventilation to protect his airway and continue aggressive antiepileptic regimen.  I called his brother and informed him about Corey Bennett clinical deterioration and transfer to intensive care unit.  Critical care time 35 minutes

## 2019-10-17 NOTE — Progress Notes (Signed)
Pharmacy Antibiotic Note  Corey Bennett is a 59 y.o. male admitted on 10/10/2019 with pneumonia.  Pharmacy has been consulted for piperacillin/tazobactam and vancomycin dosing.  Vanc trough on 8/11 was high on 750mg  Q12hr, reduced to 500mg  q12 hr. Scr slightly improved since then. UOP incompletely documented, was 1.5 ml/kg/hr over 12 hrs. WBC 12, afebrile. CXR with worsening PNA in R midlung.   Plan:  Stop Amp/sulbactam  Vanc 1000mg  x1 then 500mg  Q12 hr  Pip/tazo 3.375mg  Q 8 hr ( 4hr infusion) Monitor cultures, clinical status, renal fx, vanc levels as needed Narrow abx as able and f/u duration    Height: 6\' 1"  (185.4 cm) Weight: 75.2 kg (165 lb 12.6 oz) IBW/kg (Calculated) : 79.9  Temp (24hrs), Avg:98.9 F (37.2 C), Min:97.6 F (36.4 C), Max:99.9 F (37.7 C)  Recent Labs  Lab 10/11/19 1151 10/12/19 0921 10/13/19 0412 10/13/19 2305 10/14/19 0816 10/15/19 0426 10/16/19 0154 10/17/19 0303  WBC 15.5*  --  15.4*  --  16.6* 16.6* 12.0*  --   CREATININE  --    < > 1.85*  --  1.70* 1.53* 1.31* 1.56*  VANCOTROUGH  --   --   --  26*  --   --   --   --    < > = values in this interval not displayed.    Estimated Creatinine Clearance: 54.9 mL/min (A) (by C-G formula based on SCr of 1.56 mg/dL (H)).    No Known Allergies  Antimicrobials this admission: Vanc 8/8 >>8/12; 8/14>> Ampicillin 8/8 >> 8/11 CTX 8/8 >>8/13 Unasyn 8/14 Pip/tazo 8/14>>   Dose adjustments this admission: 8/11 Vanc tr 26 on  750mg  Q12hr reduced to 500mg  q12 hr.   Microbiology results: 8/8 MRSA PCR: neg 8/8 BCx: ngtd 8/8 UCx: 20k col/ml kleb pneumo S-cefaz  8/14 Sputum: pend 8/14 BCx: pend   Thank you for allowing pharmacy to be a part of this patient's care.  , PharmD, BCPS, BCCP Clinical Pharmacist  Please check AMION for all [facility] Pharmacy phone numbers After 10:00 PM, call Main Pharmacy [phone]

## 2019-10-17 NOTE — Progress Notes (Signed)
0100- Patient noted to be in respiratory distress. Called respiratory to come see patient. They advised that patient needed NT suctioning. Page MD Opyd. No response at this time. Rapid response was called and ordered suction. Md came to bedside at 0230. Patient was no longer in distress.   0500- Patient noted to be in respiratory distress again. Respiratory called. MD Opyd paged again. MD Opyd came to bedside and RT was able to perform NT suction. MD placed new orders for patient. Will have critical care follow up with patient. Nurse will continue to monitor patient

## 2019-10-17 NOTE — Progress Notes (Signed)
Pharmacy Antibiotic Note  Corey Bennett is a 59 y.o. male admitted on 10/10/2019 with status epilepticus and concern for meningitis, which has now been ruled out.  Pharmacy has been consulted for Unasyn dosing for aspiration pneumonia.  Plan: Start Unasyn 3 grams q6h Will continue to monitor renal function, clinical improvement, LOT, antibiotic de-escalation plans  Height: 6\' 1"  (185.4 cm) Weight: 75.2 kg (165 lb 12.6 oz) IBW/kg (Calculated) : 79.9  Temp (24hrs), Avg:98.8 F (37.1 C), Min:97.6 F (36.4 C), Max:99.6 F (37.6 C)  Recent Labs  Lab 10/11/19 1151 10/12/19 0921 10/13/19 0412 10/13/19 2305 10/14/19 0816 10/15/19 0426 10/16/19 0154 10/17/19 0303  WBC 15.5*  --  15.4*  --  16.6* 16.6* 12.0*  --   CREATININE  --    < > 1.85*  --  1.70* 1.53* 1.31* 1.56*  VANCOTROUGH  --   --   --  26*  --   --   --   --    < > = values in this interval not displayed.    Estimated Creatinine Clearance: 54.9 mL/min (A) (by C-G formula based on SCr of 1.56 mg/dL (H)).    No Known Allergies   Antimicrobials this admission: Vancomycin 8/8 >>8/13 Ampicillin 8/8 >> 8/11 CTX 8/8 >>8/13 Unasyn 8/13 >>   Dose adjustments this admission: NA  Microbiology results: 8/8 BCx - Neg 8/8 UCx - 20k K PNA (pan-S except R-amp, I-macrobid) 8/8 MRSA PCR - negative  Thank you for allowing pharmacy to be a part of this patient's care.  10/8, PharmD PGY-1 Pharmacy Resident 10/17/2019 8:51 AM  Please check [email or web address] for unit-specific pharmacy phone numbers.

## 2019-10-17 NOTE — Procedures (Signed)
Endotracheal Intubation:  Indication: Acute hypoxemic respiratory failure following seizure and altered MS  Medications: Etomidate 20 mg Rocuronium 10/20 mg Succinylcholine 70 mg  Procedure: The vocal cords were well visualized with the GlideScope. However, was unable to pass either a 8.0, 7.5 or 7.0 ETT through the vocal cords. Patient was therefore intubated over the bronchoscope with a 7.5 ETT. ETT initially taped 3 cm above the carina. A post-procedure CXR will be obtained.

## 2019-10-17 NOTE — Progress Notes (Signed)
Patient seen for respiratory distress. He has hx of left MCA stroke with aphasia, dysphagia, and hemiparesis, admitted 6 days ago with status epilepticus, extubated 8/11 and transferred out of the ICU on 8/13 on room air.   He remains NPO due to dysphagia, has a lot of secretions, poor cough, and has been requiring frequent suctioning. He had an episode of acute distress overnight with hypoxia and RR in 40s, improved briefly with deep suctioning but now in distress again.   He is on 15 Lpm via NRB with O2 saturation ~88-93% and RR in 40s. Nasotracheal suctioning performed by RT with some improvement but remains tachypneic with coarse rhonchi and 15 Lpm supplemental O2 requirement.   CXR is in process.   Situation was discussed with PCCM who recommends ABG in addition to CXR, notes that patient may require surgical consult for tracheostomy, but does not anticipate that the patient will require ICU level care.

## 2019-10-17 NOTE — Progress Notes (Addendum)
PROGRESS NOTE    Corey Bennett  UDJ:[id number] DOB: 1960/08/14 DOA: 10/10/2019 PCP: Patient, No Pcp Per    Brief Narrative:  Patient admitted to the hospital with the working diagnosis of status epilepticus, in the setting of recent large left MCA CVA. Hospitalization complicated with swallow dysfunction and acute hypoxic respiratory failure due to right lower lobe aspiration pneumonia.   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  He also has hypertension, dyslipidemia and type II that is mellitus.  Patient has been a resident of assisted living facility.  On the day of admission he was found unconscious, laying down on his right side, and having tonic-clonic movements.  By the time he reached to the emergency room he continued to have right eye deviation and he continues right-sided tonic-clonic movements.  He was intubated for airway protection, received midazolam, and phenytoin. On his initial physical examination his blood pressure was 200/101, heart rate 92, respirate 18, oxygen saturation 100% on mechanical ventilation.  His lungs were clear to auscultation bilaterally, heart S1-S2, present rhythm, abdomen soft, patient was sedated.  Patient had further work-up with brain MRI which showed large territory chronic hemorrhagic infarct in the left frontal lobe.  Facial MRI with no deep infection.  Patient was placed empirically on broad-spectrum antibiotic to cover meningitis.  His seizures were controlled with phenytoin, lacosamide and Keppra. He was successfully liberated from mechanical ventilation on 8/11.  He had persistent neurologic deficit along with swallow dysfunction.  Transferred to [facility] 8/13  Patient had an episode of respiratory distress last night, with acute hypoxemic respiratory failure. Chest film with new right lower lobe infiltrate.     Assessment & Plan:   Principal Problem:   Status epilepticus  ([place]) Active Problems:   Carotid artery stenosis   Seizure (HCC)   CVA (cerebrovascular accident due to intracerebral hemorrhage) (HCC)/ left frontal lobe   AKI (acute kidney injury) ([place])   Essential hypertension   Type 2 diabetes mellitus with hyperlipidemia ([place])    1. Status epilepticus in the setting of chronic large left MCA distribution CVA. Patient with jerking movements on his right upper extremity, case discussed with neurology Dr. Curly [place], patient will have a 2 mg lorazepam challenge, if good toleration plan to start patient on clonazepam.  This am patient awake and alert, follows commands, has mitten in his left hand.  Phenytoin level 12.6 yesterday.   Continue with IV keppra, phenytoin and lacosamide. Continue close neuro checks, fall and aspiration precautions.   Will upgrade patient to progressive care for close neurologic monitoring, neuro checks q 2 H, aspiration and fall precautions. Persistent swallow dysfunction, continue tube feedings as tolerated.   Continue with dual antiplatelet therapy with aspirin and clopidogrel.   2. New acute hypoxemic respiratory failure due to right lower lobe aspiration pneumonia. Chest film personally reviewed, new infiltrate at the right lower lobe, consistent with aspiration pneumonia. Oxygen saturation is 89% on 15 L non-rebreather mask.   Will continue supplemental oxygen to keep 02 saturation more than 92%, continue aspiration precautions including head elevated 45 at all times. Started on antibiotic therapy with IV Unasyn, will follow on temperature curve and cell count. Will check blood cultures.   2. AKI with hypokalemia. Worsening renal function with serum cr 1,31 to 1,56, K is 3,8 and serum bicarbonate 21.    Continue hydration with balanced electrolyte solutions, LR at 75 ml per H, no clinical signs of volume overload.  Avoid hypotension and nephrotoxic medications.   3.  Uncontrolled T2DM Hgb A1c 7.2. hyperglycemia with  serum glucose this am 216, will continue with insulin sliding scale for glucose cover and monitoring.  Will hold on basal insulin for now, if worsening respiratory status may need to stop tube feedings.   4. HTN. Stable blood pressure, continue close monitoring.   5. Tobacco abuse. Continue with nicotine patch.     Patient continue to be at high risk for recurrent seizures and respiratory failure   Status is: Inpatient  Remains inpatient appropriate because:IV treatments appropriate due to intensity of illness or inability to take PO   Dispo: The patient is from: Home              Anticipated d/c is to: SNF              Anticipated d/c date is: > 3 days              Patient currently is not medically stable to d/c.   DVT prophylaxis: Enoxaparin   Code Status:   full  Family Communication:    Nutrition Status: Nutrition Problem: Inadequate oral intake Etiology: inability to eat Signs/Symptoms: NPO status Interventions: Tube feeding     Consultants:   Neurology    Antimicrobials:   Unasyn     Subjective: Patient non verbal, had respiratory distress last night. This am with increase oxygen requirements, no vomiting,. Positive dyspnea.   Objective: Vitals:   10/17/19 0349 10/17/19 0400 10/17/19 0450 10/17/19 0810  BP: (!) 169/79   116/63  Pulse: 98   99  Resp: (!) 22   (!) 22  Temp: 99.4 F (37.4 C)   99.6 F (37.6 C)  TempSrc: Oral   Oral  SpO2: 97% 100%  (!) 89%  Weight:   75.2 kg   Height:        Intake/Output Summary (Last 24 hours) at 10/17/2019 0903 Last data filed at 10/16/2019 1753 Gross per 24 hour  Intake --  Output 1300 ml  Net -1300 ml   Filed Weights   10/11/19 0324 10/12/19 0500 10/17/19 0450  Weight: 71.3 kg 71.4 kg 75.2 kg    Examination:   General: deconditioned and ill looking appearing Neurology: Awake and alert, non verbal and right upper extremity paresis. Confused   E ENT: no pallor, no icterus, oral mucosa  moist Cardiovascular: No JVD. S1-S2 present, rhythmic, no gallops, rubs, or murmurs. No lower extremity edema. Pulmonary: positive bilateral breath sounds, positive rhonchi and rales bilaterally, no wheezing. . Gastrointestinal. Abdomen soft and non tender Skin. No rashes Musculoskeletal: no joint deformities     Data Reviewed: I have personally reviewed following labs and imaging studies  CBC: Recent Labs  Lab 10/10/19 2248 10/10/19 2251 10/11/19 1151 10/13/19 0412 10/14/19 0816 10/15/19 0426 10/16/19 0154  WBC 19.6*   < > 15.5* 15.4* 16.6* 16.6* 12.0*  NEUTROABS 16.7*  --  13.3*  --   --   --   --   HGB 11.7*   < > 10.7* 9.1* 9.3* 8.6* 8.5*  HCT 34.5*   < > 30.6* 27.0* 27.7* 25.6* 25.2*  MCV 85.0   < > 85.0 87.9 87.7 86.2 85.7  PLT 329   < > 293 239 267 306 328   < > = values in this interval not displayed.   Basic Metabolic Panel: Recent Labs  Lab 10/11/19 0327 10/11/19 0554 10/12/19 0921 10/12/19 1425 10/12/19 1757 10/13/19 0412 10/13/19 1754 10/14/19  1761 10/15/19 0426 10/16/19 0154 10/17/19 0303  NA 135   < >   < >  --   --  138  --  138 143 139 138  K 4.3   < >   < >  --   --  3.4*  --  3.4* 3.1* 3.3* 3.8  CL 101   < >   < >  --   --  106  --  106 109 107 103  CO2 17*   < >   < >  --   --  18*  --  20* 23 22 21*  GLUCOSE 292*   < >   < >  --   --  152*  --  206* 123* 110* 216*  BUN 29*   < >   < >  --   --  27*  --  29* 21* 18 19  CREATININE 1.63*   < >   < >  --   --  1.85*  --  1.70* 1.53* 1.31* 1.56*  CALCIUM 9.1   < >   < >  --   --  8.4*  --  8.4* 8.6* 8.5* 8.4*  MG 1.7  --   --  1.9 1.8 1.7 2.0  --   --   --   --   PHOS 3.4  --   --  3.5 3.5 3.0 3.0  --   --   --   --    < > = values in this interval not displayed.   GFR: Estimated Creatinine Clearance: 54.9 mL/min (A) (by C-G formula based on SCr of 1.56 mg/dL (H)). Liver Function Tests: Recent Labs  Lab 10/10/19 2248 10/16/19 0154  AST 23  --   ALT 21  --   ALKPHOS 118  --   BILITOT 0.6   --   PROT 8.2*  --   ALBUMIN 4.1 2.5*   No results for input(s): LIPASE, AMYLASE in the last 168 hours. No results for input(s): AMMONIA in the last 168 hours. Coagulation Profile: Recent Labs  Lab 10/10/19 2248  INR 1.1   Cardiac Enzymes: No results for input(s): CKTOTAL, CKMB, CKMBINDEX, TROPONINI in the last 168 hours. BNP (last 3 results) No results for input(s): PROBNP in the last 8760 hours. HbA1C: No results for input(s): HGBA1C in the last 72 hours. CBG: Recent Labs  Lab 10/16/19 1529 10/16/19 1952 10/16/19 2326 10/17/19 0351 10/17/19 0829  GLUCAP 171* 88 149* 208* 147*   Lipid Profile: No results for input(s): CHOL, HDL, LDLCALC, TRIG, CHOLHDL, LDLDIRECT in the last 72 hours. Thyroid Function Tests: No results for input(s): TSH, T4TOTAL, FREET4, T3FREE, THYROIDAB in the last 72 hours. Anemia Panel: No results for input(s): VITAMINB12, FOLATE, FERRITIN, TIBC, IRON, RETICCTPCT in the last 72 hours.    Radiology Studies: I have reviewed all of the imaging during this hospital visit personally     Scheduled Meds: . aspirin  150 mg Rectal Daily  . chlorhexidine gluconate (MEDLINE KIT)  15 mL Mouth Rinse BID  . Chlorhexidine Gluconate Cloth  6 each Topical Daily  . clopidogrel  75 mg Per Tube Daily  . docusate  100 mg Per Tube BID  . enoxaparin (LOVENOX) injection  40 mg Subcutaneous Q24H  . feeding supplement (PROSource TF)  45 mL Per Tube Daily  . free water  150 mL Per Tube Q4H  . insulin aspart  0-15 Units Subcutaneous Q4H  . phenytoin (DILANTIN) IV  100 mg Intravenous Q8H  . polyethylene glycol  17 g Per Tube Daily   Continuous Infusions: . sodium chloride Stopped (10/15/19 1713)  . sodium chloride 75 mL/hr at 10/16/19 1554  . ampicillin-sulbactam (UNASYN) IV    . feeding supplement (OSMOLITE 1.5 CAL) 1,000 mL (10/16/19 1628)  . lacosamide (VIMPAT) IV 150 mg (10/16/19 2316)  . levETIRAcetam 1,000 mg (10/16/19 2212)     LOS: 6 days         Trysta Showman Gerome Apley, MD

## 2019-10-17 NOTE — Progress Notes (Addendum)
Interval events / Subjective:  - Vimpat increased to 150 mg BID and Keppra to 1000 mg BID starting last night - Had at least 2 episodes of respiratory distress overnight  ROS: Unable to obtain due to speech disturbance  Examination  Vital signs in last 24 hours: Temp:  [97.6 F (36.4 C)-99.6 F (37.6 C)] 99.6 F (37.6 C) (08/14 0810) Pulse Rate:  [85-99] 99 (08/14 0810) Resp:  [18-22] 22 (08/14 0810) BP: (116-169)/(63-79) 116/63 (08/14 0810) SpO2:  [89 %-100 %] 89 % (08/14 0810) Weight:  [75.2 kg] 75.2 kg (08/14 0450)  General: lying in bed,not in apparent distress Psychiatric: Appears cooperative but mildly frustrated CVS: pulse-normal rate and rhythm RS: breathing comfortably, rhonchorous breath sounds Extremities: normal,warm Neuro: Awake, alert, attempting to speak but difficult to understand what patient is saying, did appear to say his name, will mimic but not clearly follow commands, pupils equally round and reactive, able to track examiner in room, is not moving the right UE though winces to pain.  Intermittent rhythmic right upper extremity flexion/extension at elbow movements, now also involving the shoulder   Basic Metabolic Panel: Recent Labs  Lab 10/11/19 0327 10/11/19 0554 10/12/19 0921 10/12/19 1425 10/12/19 1757 10/13/19 0412 10/13/19 0412 10/13/19 1754 10/14/19 0816 10/14/19 0816 10/15/19 0426 10/16/19 0154 10/17/19 0303  NA 135   < >   < >  --   --  138  --   --  138  --  143 139 138  K 4.3   < >   < >  --   --  3.4*  --   --  3.4*  --  3.1* 3.3* 3.8  CL 101   < >   < >  --   --  106  --   --  106  --  109 107 103  CO2 17*   < >   < >  --   --  18*  --   --  20*  --  23 22 21*  GLUCOSE 292*   < >   < >  --   --  152*  --   --  206*  --  123* 110* 216*  BUN 29*   < >   < >  --   --  27*  --   --  29*  --  21* 18 19  CREATININE 1.63*   < >   < >  --   --  1.85*  --   --  1.70*  --  1.53* 1.31* 1.56*  CALCIUM 9.1   < >   < >  --   --  8.4*   < >  --   8.4*   < > 8.6* 8.5* 8.4*  MG 1.7  --   --  1.9 1.8 1.7  --  2.0  --   --   --   --   --   PHOS 3.4  --   --  3.5 3.5 3.0  --  3.0  --   --   --   --   --    < > = values in this interval not displayed.    CBC: Recent Labs  Lab 10/10/19 2248 10/10/19 2251 10/11/19 1151 10/13/19 0412 10/14/19 0816 10/15/19 0426 10/16/19 0154  WBC 19.6*   < > 15.5* 15.4* 16.6* 16.6* 12.0*  NEUTROABS 16.7*  --  13.3*  --   --   --   --  HGB 11.7*   < > 10.7* 9.1* 9.3* 8.6* 8.5*  HCT 34.5*   < > 30.6* 27.0* 27.7* 25.6* 25.2*  MCV 85.0   < > 85.0 87.9 87.7 86.2 85.7  PLT 329   < > 293 239 267 306 328   < > = values in this interval not displayed.     Coagulation Studies: No results for input(s): LABPROT, INR in the last 72 hours.   Corrected Phenytoin dose is 15 today   Imaging No new brain imaging overnight  Did have a chest x-ray, which I personally reviewed and reveals a worsening right lower lobe infiltrate  ASSESSMENT AND PLAN: 59 year old male with left MCA stroke who presented with focal convulsive status epilepticus which improved after Keppra, Dilantin and propofol. With treatment of UTI, patient's fever curve and leukocytosis have been improving. Phenytoin level has been dropping with improving metabolic function (it is both renally and hepatically cleared, and there is room to increase this if necessary, but will involve pharmacy for dose adjustments).   The patient's focal motor status may be worsening given now there appears to be shoulder involvement but I did not appreciate on prior evaluation.  This is despite escalation of antiseizure medications.  We will talk to primary team today about Ativan challenge followed by initiation of clobazam given that we do have oral access now.  I am most concerned about the possibility of the seizures contributing to his difficulty swallowing, as well as the possibility of infection from an aspiration pneumonia further lowering his seizure  threshold and leading to secondary generalization.  Therefore this patient requires very close monitoring and attention to his fever curve and respiratory status.  Chronic left MCA infarct Focal motor status epilepticus, worsening Resolving infection  Worsening aspiration  Recommendations -2 mg Ativan IV challenge, if successful will add clobazam, otherwise will consider dose adjustment of other AEDs -Continue Keppra to 1000 mg BID  -Continue Vimpat to 150 mg BID -Continue Dilantin at100 mg TID -D/C EEG; we will follow clinical exam -Management of rest of comorbidities per primary team, appreciate special attention to his respiratory status  I have spent a total of30 minuteswith the patient reviewing hospitalnotes,  test results, labs and examining the patient as well as establishing an assessment and plan that was discussed personally with the patient's team and patient.>50% of time was spent in direct patient care.  Brooke Dare MD-PhD [facility] [phone]   For questions after 7pm please refer to AMION to reach the Neurologist on call  On reevaluation, the patient was febrile to 100.5 and was having continued frequent twitching of the right upper extremity including elbow and shoulder.  With administration of 2 mg of Ativan the twitching did improve in frequency as well as spread (minimal and very infrequent involvement of the shoulder, even with intermittent continued right elbow twitching).  Asked Dr. Ella Jubilee to come to bedside given patient's oxygen saturations did drop to the 70s, coming back up to mid 80s.  Given work of breathing, fever, suspect aspiration pneumonia and spoke lowering seizure threshold as well as compromising patient's oxygenation. Plan to intubate for respiratory status, recommending propofol to additionally gain seizure control. We will plan to start clobazam after respiratory status has been stabilized  Brooke Dare MD-PhD [facility] [phone]

## 2019-10-17 NOTE — Significant Event (Addendum)
Rapid Response Event Note   Reason for Call : Respiratory failure   Initial Focused Assessment: The [facility] was bedside when we walked into the room. The patient appeared to visibly hypoxic.  He was on NRB receiving 16L of O2.  His O2 sat upon walking into the room was fluctuating between 80-90.  The patient had increased work of breathing and was using accessory muscles.  The patient was rhonchus bilaterally.  The patient appeared to be completely altered, but had just received Ativan for seizures before [place] responding.  The patient was extremely diaphoretic.  BP 160/81 (101) Pulse 107 Resp 21 O2 79    Interventions: We put the patient on [place] and NRB for transport.  I attempted to put in another IV because the patient only had one PIV but I was unsuccessful.   Plan of Care: Patient was urgently transferred to 4NICU safely.  We stayed and assisted PCCM with intubation.      Event Summary:   MD Notified:  Call Time: 1011 Arrival Time: 1015 End Time: 1200   Andrey Spearman, RN

## 2019-10-18 LAB — BASIC METABOLIC PANEL
Anion gap: 11 (ref 5–15)
BUN: 29 mg/dL — ABNORMAL HIGH (ref 6–20)
CO2: 21 mmol/L — ABNORMAL LOW (ref 22–32)
Calcium: 8.1 mg/dL — ABNORMAL LOW (ref 8.9–10.3)
Chloride: 107 mmol/L (ref 98–111)
Creatinine, Ser: 1.85 mg/dL — ABNORMAL HIGH (ref 0.61–1.24)
GFR calc Af Amer: 46 mL/min — ABNORMAL LOW (ref >60)
GFR calc non Af Amer: 39 mL/min — ABNORMAL LOW (ref >60)
Glucose, Bld: 296 mg/dL — ABNORMAL HIGH (ref 70–99)
Potassium: 3.3 mmol/L — ABNORMAL LOW (ref 3.5–5.1)
Sodium: 139 mmol/L (ref 135–145)

## 2019-10-18 LAB — CBC WITH DIFFERENTIAL/PLATELET
Abs Immature Granulocytes: 0.13 10*3/uL — ABNORMAL HIGH (ref 0.00–0.07)
Basophils Absolute: 0.1 10*3/uL (ref 0.0–0.1)
Basophils Relative: 1 %
Eosinophils Absolute: 0.1 10*3/uL (ref 0.0–0.5)
Eosinophils Relative: 1 %
HCT: 22.3 % — ABNORMAL LOW (ref 39.0–52.0)
Hemoglobin: 7.4 g/dL — ABNORMAL LOW (ref 13.0–17.0)
Immature Granulocytes: 1 %
Lymphocytes Relative: 10 %
Lymphs Abs: 1.1 10*3/uL (ref 0.7–4.0)
MCH: 29 pg (ref 26.0–34.0)
MCHC: 33.2 g/dL (ref 30.0–36.0)
MCV: 87.5 fL (ref 80.0–100.0)
Monocytes Absolute: 0.6 10*3/uL (ref 0.1–1.0)
Monocytes Relative: 5 %
Neutro Abs: 9.9 10*3/uL — ABNORMAL HIGH (ref 1.7–7.7)
Neutrophils Relative %: 82 %
Platelets: UNDETERMINED 10*3/uL (ref 150–400)
RBC: 2.55 MIL/uL — ABNORMAL LOW (ref 4.22–5.81)
RDW: 14.3 % (ref 11.5–15.5)
WBC: 11.8 10*3/uL — ABNORMAL HIGH (ref 4.0–10.5)
nRBC: 0.2 % (ref 0.0–0.2)

## 2019-10-18 LAB — BASIC METABOLIC PANEL WITH GFR
Anion gap: 12 (ref 5–15)
BUN: 29 mg/dL — ABNORMAL HIGH (ref 6–20)
CO2: 21 mmol/L — ABNORMAL LOW (ref 22–32)
Calcium: 8.2 mg/dL — ABNORMAL LOW (ref 8.9–10.3)
Chloride: 105 mmol/L (ref 98–111)
Creatinine, Ser: 2.12 mg/dL — ABNORMAL HIGH (ref 0.61–1.24)
GFR calc Af Amer: 39 mL/min — ABNORMAL LOW
GFR calc non Af Amer: 33 mL/min — ABNORMAL LOW
Glucose, Bld: 292 mg/dL — ABNORMAL HIGH (ref 70–99)
Potassium: 3.2 mmol/L — ABNORMAL LOW (ref 3.5–5.1)
Sodium: 138 mmol/L (ref 135–145)

## 2019-10-18 LAB — GLUCOSE, CAPILLARY
Glucose-Capillary: 221 mg/dL — ABNORMAL HIGH (ref 70–99)
Glucose-Capillary: 256 mg/dL — ABNORMAL HIGH (ref 70–99)
Glucose-Capillary: 183 mg/dL — ABNORMAL HIGH (ref 70–99)
Glucose-Capillary: 263 mg/dL — ABNORMAL HIGH (ref 70–99)
Glucose-Capillary: 339 mg/dL — ABNORMAL HIGH (ref 70–99)
Glucose-Capillary: 236 mg/dL — ABNORMAL HIGH (ref 70–99)

## 2019-10-18 LAB — TRIGLYCERIDES: Triglycerides: 90 mg/dL

## 2019-10-18 LAB — PHENYTOIN LEVEL, TOTAL: Phenytoin Lvl: 7.5 ug/mL — ABNORMAL LOW (ref 10.0–20.0)

## 2019-10-18 MED ORDER — POTASSIUM CHLORIDE 20 MEQ PO PACK
20.0000 meq | PACK | Freq: Two times a day (BID) | ORAL | Status: DC
  Filled 2019-10-18: qty 1

## 2019-10-18 MED ORDER — PHENYTOIN SODIUM 50 MG/ML IJ SOLN
100.0000 mg | Freq: Three times a day (TID) | INTRAMUSCULAR | Status: DC
  Administered 2019-10-18 – 2019-10-24 (×17): 100 mg via INTRAVENOUS
  Filled 2019-10-18 (×17): qty 2

## 2019-10-18 MED ORDER — PANTOPRAZOLE SODIUM 40 MG IV SOLR
40.0000 mg | Freq: Every day | INTRAVENOUS | Status: DC
  Administered 2019-10-18 – 2019-10-21 (×4): 40 mg via INTRAVENOUS
  Filled 2019-10-18 (×5): qty 40

## 2019-10-18 MED ORDER — SODIUM CHLORIDE 0.9 % IV SOLN
150.0000 mg | Freq: Once | INTRAVENOUS | Status: AC
  Administered 2019-10-18: 150 mg via INTRAVENOUS
  Filled 2019-10-18: qty 3

## 2019-10-18 MED ORDER — INSULIN GLARGINE 100 UNIT/ML ~~LOC~~ SOLN
15.0000 [IU] | Freq: Every day | SUBCUTANEOUS | Status: DC
  Administered 2019-10-18: 15 [IU] via SUBCUTANEOUS
  Filled 2019-10-18 (×2): qty 0.15

## 2019-10-18 MED ORDER — VANCOMYCIN HCL 750 MG/150ML IV SOLN
750.0000 mg | INTRAVENOUS | Status: DC
  Administered 2019-10-19: 750 mg via INTRAVENOUS
  Filled 2019-10-18: qty 150

## 2019-10-18 NOTE — Progress Notes (Signed)
Phenytoin Follow-Up Consult Indication: Phenytoin  No Known Allergies  Patient Measurements: Height: 6\' 1"  (185.4 cm) Weight: 74.9 kg (165 lb 2 oz) IBW/kg (Calculated) : 79.9 TPN AdjBW (KG): 68.7 Body mass index is 21.79 kg/m.   Vital signs: Temp: 98.3 F (36.8 C) (08/15 0800) Temp Source: Axillary (08/15 0800) BP: 124/64 (08/15 1121) Pulse Rate: 76 (08/15 0900)  Labs: Lab Results  Component Value Date/Time   Phenytoin Lvl 7.5 (L) 10/18/2019 0554   Lab Results  Component Value Date   PHENYTOIN 7.5 (L) 10/18/2019   Estimated Creatinine Clearance: 40.2 mL/min (A) (by C-G formula based on SCr of 2.12 mg/dL (H)).   Dosing/Levels 8/7 DPH 1500mg  IV x1 then 100mg  Q8H (~4 mg/kg/d based on TBW; IBW 80 kg) 8/8 DPH level (post-load): 20.1 >> no adjustments, cont current dosing 8/11 DPH 14.1, albumin 4.1 > no adjustment 8/12 DPH 13.4.  Vimpat and Onfi added. 8/13 DPH 12.6, Albumin 2.5, corrected 21> no adjustment; Vimpat and Keppra increased d/t improved CrCl 8/15 DPH 7.5 > corrected = 12.5, free level ~0.95 > increase one dose to 150mg  then resume 100 Q8hr given renal function worsening  Assessment: 8/15 corrected phenytoin level is below goal but calculated free phenytoin level is near goal. Given renal function worsening, expect phenytoin level to increase. Will give one dose of 150mg  then resume 100 Q8hr.  Seizure activity: RUE twitching Significant potential drug interactions: none  Goals of care:  Total phenytoin level: 10-20 mcg/ml Free phenytoin level: 1-2 mcg/ml  Plan:  Give phenytoin IV 150mg  x1 then resume 100mg  Q8hr  F/u renal function, albumin, seizure activity  F/u next level, 8/17 0500   9/15, PharmD, [place], [facility] Clinical Pharmacist  Please check AMION for all [facility] phone numbers After 10:00 PM, call Main Pharmacy [phone]

## 2019-10-18 NOTE — Progress Notes (Signed)
Pharmacy Antibiotic Note  Corey Bennett is a 59 y.o. male admitted on 10/10/2019 with pneumonia.  Pharmacy has been consulted for piperacillin/tazobactam and vancomycin dosing.  Scr uptrending, since then.  UOP incompletely documented, was 1.3 ml/kg/hr over 12 hrs. WBC 11.8, tmax 101.9. 8/14 CXR with worsening PNA in R midlung   Plan:  Decrease Vanc to 750mg  Q24 hr  Pip/tazo 3.375mg  Q 8 hr ( 4hr infusion) Monitor cultures, clinical status, renal fx, vanc levels as needed Narrow abx as able and f/u duration    Height: 6\' 1"  (185.4 cm) Weight: 74.9 kg (165 lb 2 oz) IBW/kg (Calculated) : 79.9  Temp (24hrs), Avg:100 F (37.8 C), Min:98.2 F (36.8 C), Max:101.9 F (38.8 C)  Recent Labs  Lab 10/13/19 0412 10/13/19 0412 10/13/19 2305 10/14/19 0816 10/15/19 0426 10/16/19 0154 10/17/19 0303 10/18/19 0554  WBC 15.4*  --   --  16.6* 16.6* 12.0*  --  11.8*  CREATININE 1.85*   < >  --  1.70* 1.53* 1.31* 1.56* 2.12*  VANCOTROUGH  --   --  26*  --   --   --   --   --    < > = values in this interval not displayed.    Estimated Creatinine Clearance: 40.2 mL/min (A) (by C-G formula based on SCr of 2.12 mg/dL (H)).    No Known Allergies  Antimicrobials this admission: Vanc 8/8 >>8/12; 8/14>> Ampicillin 8/8 >> 8/11 CTX 8/8 >>8/13 Unasyn 8/14 Pip/tazo 8/14>>   Dose adjustments this admission: 8/11 Vanc tr 26 on  750mg  Q12hr reduced to 500mg  q12 hr.  8/15 Scr up trending, Vanc to 750mg  Q24hr  Microbiology results: 8/8 MRSA PCR: neg 8/8 BCx: ngtd 8/8 UCx: 20k col/ml kleb pneumo S-cefaz  8/14 Sputum: ngtd 8/14 BCx: ngtd  Thank you for allowing pharmacy to be a part of this patient's care.  10/8, PharmD, BCPS, [facility] Clinical Pharmacist  Please check AMION for all [facility] phone numbers After 10:00 PM, call Main Pharmacy [phone]

## 2019-10-18 NOTE — Progress Notes (Signed)
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 Patient is living in a assisted living facility and can ambulate by himself. Patient was found by staff to have a right sided twitching with right eye deviation. He was given Versed and Dilantin and was intubated for airway protection.  Patient was extubated on 8/11 and his seizures was controlled with Keppra, phenytoin, and Vimpat.  He was transferred to [address] for medical management  On 8/14, patient continued to have seizure with twitching of the right upper extremity.  2 mg of lorazepam was given.  Patient continued to desat in the 80s on 15 L of nonrebreather mask with altered mental status.  Patient was intubated to protect airway and transferred to the ICU.  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 Large area of encephalomalacia throughout the left MCA territory, consistent with remote infarct. 8/8 CXR>>lungs clear 8/8 UDS>>+ benzo (but given versed in ED); o/w negative 8/8 MRI of brain>>large territory chronic hemorrhagic infarct in the left frontal lobe.Ill-defined hyperintensity throughout the left basal ganglia and left thalamus with mild enhancement in dobutamine likely secondary to encephalitis, infiltrating tumor or atypical subacute  infarction 8/8 MRI of face and trigeminal region>>rules out soft tissue abscess  Micro Data:  8/8 Sars-CoV-2>>negative 8/8 BCx2>> no growth 8/8 UC>> Klebsiella pneumonia  Antimicrobials:  Vancomycin 8/8>> 8/12 Ceftriaxone 8/8>> 8/12 Ampicillin 8/8>>8/11 Unasyn 8/14   Interim history/subjective:  Sedated on vent  Objective   Blood pressure 139/65, pulse 75, temperature 98.2 F (36.8 C), temperature source Axillary, resp. rate 20, height 6\' 1"  (1.854 m), weight 74.9 kg, SpO2 100 %.    Vent Mode: PRVC FiO2 (%):  [40 %-70 %] 40 % Set Rate:  [16 bmp] 16 bmp Vt Set:  [430 mL-630 mL] 630 mL PEEP:  [8 cmH20] 8 cmH20 Plateau Pressure:  [17 cmH20-22 cmH20] 22 cmH20   Intake/Output Summary (Last 24 hours) at 10/18/2019 1526 Last data filed at 10/18/2019 1200 Gross per 24 hour  Intake 3810.48 ml  Output 400 ml  Net 3410.48 ml   Filed Weights   10/12/19 0500 10/17/19 0450 10/18/19 0432  Weight: 71.4 kg 75.2 kg 74.9 kg    Examination: General: Breathing comfortably on vent HENT: [place]/AT; PRL; ETT, Cortrak in place Lungs: Clear bilaterally, anterior and laterally Cardiovascular: RRR, no m/r/g Abdomen: Supple, w/o guarding, +BS Extremities: No c/c/e Neuro: Sedated with no positioning on noxious stimuli GU: Foley  Assessment & Plan:  1) Acute respiratory distress requiring mechanical ventilation -vent requirements ~12lpm; adequate oxygenation -Continue ventilator.  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  Neurology is following -Appreciate neurology recommendation -anti-seizure meds dosing per Neurol/Pharm 3) AKI/kypokalemia -Will suppl K+ via Cortrak today -Trend Cr 4) Hypertension -Continue amlodipine 10  mg -Continue losartan 25 mg daily 5) DM -SSI  Best practice:  Diet: TF Pain/Anxiety/Delirium protocol  (if indicated): Propofol VAP protocol (if indicated): Yes DVT prophylaxis: enoxaparin GI prophylaxis: PPI Glucose control: SSI/ Lantus Mobility: BR Code Status: Full Family Communication: To update Disposition: ICU  Labs   CBC: Recent Labs  Lab 10/13/19 0412 10/13/19 0412 10/14/19 0816 10/15/19 0426 10/16/19 0154 10/17/19 1231 10/18/19 0554  WBC 15.4*  --  16.6* 16.6* 12.0*  --  11.8*  NEUTROABS  --   --   --   --   --   --  9.9*  HGB 9.1*   < > 9.3* 8.6* 8.5* 8.5* 7.4*  HCT 27.0*   < > 27.7* 25.6* 25.2* 25.0* 22.3*  MCV 87.9  --  87.7 86.2 85.7  --  87.5  PLT 239  --  267 306 328  --  PLATELET CLUMPS NOTED ON SMEAR, UNABLE TO ESTIMATE   < > = values in this interval not displayed.    Basic Metabolic Panel: Recent Labs  Lab 10/12/19 0921 10/12/19 1425 10/12/19 1757 10/13/19 0412 10/13/19 0412 10/13/19 1754 10/14/19 0816 10/14/19 0816 10/15/19 0426 10/16/19 0154 10/17/19 0303 10/17/19 1231 10/18/19 0554  NA   < >  --   --  138   < >  --  138   < > 143 139 138 140 138  K   < >  --   --  3.4*   < >  --  3.4*   < > 3.1* 3.3* 3.8 3.8 3.2*  CL   < >  --   --  106   < >  --  106  --  109 107 103  --  105  CO2   < >  --   --  18*   < >  --  20*  --  23 22 21*  --  21*  GLUCOSE   < >  --   --  152*   < >  --  206*  --  123* 110* 216*  --  292*  BUN   < >  --   --  27*   < >  --  29*  --  21* 18 19  --  29*  CREATININE   < >  --   --  1.85*   < >  --  1.70*  --  1.53* 1.31* 1.56*  --  2.12*  CALCIUM   < >  --   --  8.4*   < >  --  8.4*  --  8.6* 8.5* 8.4*  --  8.2*  MG  --  1.9 1.8 1.7  --  2.0  --   --   --   --   --   --   --   PHOS  --  3.5 3.5 3.0  --  3.0  --   --   --   --   --   --   --    < > = values in this interval not displayed.   GFR: Estimated Creatinine Clearance: 40.2 mL/min (A) (by C-G formula based on SCr of 2.12 mg/dL (H)). Recent Labs  Lab 10/14/19 0816 10/15/19 0426 10/16/19 0154 10/18/19 0554  WBC 16.6* 16.6* 12.0* 11.8*    Liver  Function Tests: Recent Labs  Lab 10/16/19 0154  ALBUMIN 2.5*   No results for input(s): LIPASE, AMYLASE in the last 168 hours. No results for  input(s): AMMONIA in the last 168 hours.  ABG    Component Value Date/Time   PHART 7.339 (L) 10/17/2019 1231   PCO2ART 36.7 10/17/2019 1231   PO2ART 117 (H) 10/17/2019 1231   HCO3 19.6 (L) 10/17/2019 1231   TCO2 21 (L) 10/17/2019 1231   ACIDBASEDEF 5.0 (H) 10/17/2019 1231   O2SAT 98.0 10/17/2019 1231     Coagulation Profile: No results for input(s): INR, PROTIME in the last 168 hours.  Cardiac Enzymes: No results for input(s): CKTOTAL, CKMB, CKMBINDEX, TROPONINI in the last 168 hours.  HbA1C: Hgb A1c MFr Bld  Date/Time Value Ref Range Status  09/04/2019 10:00 AM 7.2 (H) 4.8 - 5.6 % Final    Comment:    (NOTE) Pre diabetes:          5.7%-6.4%  Diabetes:              >6.4%  Glycemic control for   <7.0% adults with diabetes     CBG: Recent Labs  Lab 10/17/19 2315 10/18/19 0411 10/18/19 0721 10/18/19 1127 10/18/19 1524  GLUCAP 174* 221* 256* 183* 263*    Past Medical History  He,  has a past medical history of Carotid artery occlusion, Diabetes (HCC), Hypercholesterolemia, Hypertension, Paralysis (HCC), and Stroke (HCC).   Surgical History    Past Surgical History:  Procedure Laterality Date  . ENDARTERECTOMY Left 09/08/2019   Procedure: LEFT CAROTID ENDARTERECTOMY;  Surgeon: Chuck Hint, MD;  Location: [facility] OR;  Service: Vascular;  Laterality: Left;  . PATCH ANGIOPLASTY Left 09/08/2019   Procedure: PATCH ANGIOPLASTY USING XENOSURE BIOLOGIC PATCH 1x6cm;  Surgeon: Chuck Hint, MD;  Location: [facility] OR;  Service: Vascular;  Laterality: Left;  . TONSILLECTOMY       Social History   reports that he has been smoking cigarettes. He has been smoking about 1.00 pack per day. He has never used smokeless tobacco. He reports current alcohol use. He reports that he does not use drugs.   Family History   His  family history is not on file.   Allergies No Known Allergies   Home Medications  Prior to Admission medications   Medication Sig Start Date End Date Taking? Authorizing Provider  amLODipine (NORVASC) 10 MG tablet Take 10 mg by mouth daily.    Yes [provider]  aspirin 81 MG chewable tablet Chew 81 mg by mouth daily.   Yes [provider]  atorvastatin (LIPITOR) 80 MG tablet Take 80 mg by mouth daily.    Yes [provider]  chlorhexidine (PERIDEX) 0.12 % solution Use as directed 15 mLs in the mouth or throat 2 (two) times daily.   Yes [provider]  Cholecalciferol (VITAMIN D-3) 125 MCG (5000 UT) TABS Take 5,000 Units by mouth daily.    Yes [provider]  clopidogrel (PLAVIX) 75 MG tablet Take 1 tablet (75 mg total) by mouth daily. 09/09/19  Yes Emilie Rutter, PA-C  HYDROcodone-acetaminophen (NORCO/VICODIN) 5-325 MG tablet Take 1 tablet by mouth every 6 (six) hours as needed for moderate pain.   Yes [provider]  insulin glargine (LANTUS) 100 UNIT/ML injection Inject 12 Units into the skin at bedtime.   Yes [provider]  insulin lispro (HUMALOG) 100 UNIT/ML injection Inject 5 Units into the skin 3 (three) times daily before meals. *HOLD INSULIN IF BLOOD GLUCOSE IS LESS THAN 120.*   Yes [provider]  losartan (COZAAR) 25 MG tablet Take 25 mg by mouth daily.    Yes  [provider]  metFORMIN (GLUCOPHAGE) 1000 MG tablet Take 1,000 mg by mouth 2 (two) times daily with a meal.    Yes [provider]  penicillin v potassium (VEETID) 500 MG tablet Take 500 mg by mouth 4 (four) times daily.   Yes [provider]  senna (SENOKOT) 8.6 MG TABS tablet Take 1 tablet by mouth daily.   Yes [provider]     Critical care time: 35 min

## 2019-10-18 NOTE — Progress Notes (Addendum)
Interval events / Subjective:  -Became febrile, likely worsening focal motor status secondary to infection Intubated for airway protection given multiple episodes of respiratory -decompensation and need for seizure control to prevent secondary generalization -antibiotics broadened per primary team -Now afebrile -ICU nurse reports focal motor status is now absent with no noted right upper extremity twitching  ROS: Unable to obtain due to speech disturbance  Examination  Vital signs in last 24 hours: Temp:  [99.6 F (37.6 C)-101.9 F (38.8 C)] 101.5 F (38.6 C) (08/15 0000) Pulse Rate:  [71-110] 71 (08/15 0700) Resp:  [16-23] 17 (08/15 0700) BP: (67-167)/(48-129) 114/61 (08/15 0700) SpO2:  [89 %-100 %] 100 % (08/15 0700) FiO2 (%):  [40 %-100 %] 40 % (08/15 0331) Weight:  [74.9 kg] 74.9 kg (08/15 0432)  General: Intubated, lightly sedated with propofol, appears comfortable CVS: pulse-normal rate and rhythm RS: breathing comfortably,intubated Extremities: normal,warm Neuro:   Mental status: Asleep but arouses to voice, briefly opening eyes.   Cranial nerves: Pupils equal round reactive to light.  Corneals intact bilaterally to eyelash brush.   Sensory/motor: Brisker movements with the left upper extremity than the right, but does have some trace movement in the right upper extremity while examiner is holding it.  Moves bilateral lower extremities to tickle. Notably there is no twitching of the right upper extremity  Basic Metabolic Panel: Recent Labs  Lab 10/12/19 0921 10/12/19 1425 10/12/19 1757 10/13/19 0412 10/13/19 0412 10/13/19 1754 10/14/19 0816 10/14/19 0816 10/15/19 0426 10/15/19 0426 10/16/19 0154 10/17/19 0303 10/17/19 1231 10/18/19 0554  NA   < >  --   --  138   < >  --  138   < > 143  --  139 138 140 138  K   < >  --   --  3.4*   < >  --  3.4*   < > 3.1*  --  3.3* 3.8 3.8 3.2*  CL   < >  --   --  106   < >  --  106  --  109  --  107 103  --  105  CO2   <  >  --   --  18*   < >  --  20*  --  23  --  22 21*  --  21*  GLUCOSE   < >  --   --  152*   < >  --  206*  --  123*  --  110* 216*  --  292*  BUN   < >  --   --  27*   < >  --  29*  --  21*  --  18 19  --  29*  CREATININE   < >  --   --  1.85*   < >  --  1.70*  --  1.53*  --  1.31* 1.56*  --  2.12*  CALCIUM   < >  --   --  8.4*   < >  --  8.4*   < > 8.6*   < > 8.5* 8.4*  --  8.2*  MG  --  1.9 1.8 1.7  --  2.0  --   --   --   --   --   --   --   --   PHOS  --  3.5 3.5 3.0  --  3.0  --   --   --   --   --   --   --   --    < > =  values in this interval not displayed.    CBC: Recent Labs  Lab 10/11/19 1151 10/11/19 1151 10/13/19 0412 10/14/19 0816 10/15/19 0426 10/16/19 0154 10/17/19 1231  WBC 15.5*  --  15.4* 16.6* 16.6* 12.0*  --   NEUTROABS 13.3*  --   --   --   --   --   --   HGB 10.7*   < > 9.1* 9.3* 8.6* 8.5* 8.5*  HCT 30.6*   < > 27.0* 27.7* 25.6* 25.2* 25.0*  MCV 85.0  --  87.9 87.7 86.2 85.7  --   PLT 293  --  239 267 306 328  --    < > = values in this interval not displayed.     Coagulation Studies: No results for input(s): LABPROT, INR in the last 72 hours.    Results for Bennett, [name] (MRN [id number]) as of 10/16/2019 07:22  Ref. Range 10/14/2019 15:19 10/15/2019 04:26 10/16/2019 01:54  Phenytoin Lvl Latest Ref Range: 10.0 - 20.0 ug/mL 14.1 13.4 12.6    Imaging No new brain imaging overnight  ASSESSMENT AND PLAN: 59 year old male with left MCA stroke who presented with focal convulsive status epilepticus which improved after Keppra, Dilantin and propofol, then worsened again likely secondary to aspiration pneumonia.  Was not improved with addition of Vimpat, did improve with 2 mg IV Ativan, there is further compromised patient's tenuous respiratory status is due to his aspiration pneumonia.  Now patient is intubated and back on propofol, and broad antibiotic coverage with resolution of his mental status.  -Chronic left MCA infarct -Focal motor status  epilepticus -Aspiration pneumonia  Recommendations  #Focal motor status, currently resolved -Will continue Keppra to 1000 mg BID   - If CrCl drops below 30, will down titrate to 750 mg -Continue Vimpat 150 mg BID -Continue Dilantin at100 mg TID  -Pharmacy following for goal level of 15-20 -Continue to follow clinical exam for RUE twitching -Neurology will continue to follow  #Aspiration pneumonia -Appreciate primary team management  Brooke Dare MD-PhD [facility] [phone]   For questions after 7pm please refer to AMION to reach the Neurologist on call

## 2019-10-19 ENCOUNTER — Inpatient Hospital Stay (HOSPITAL_COMMUNITY): Payer: BC Managed Care – PPO

## 2019-10-19 ENCOUNTER — Encounter (HOSPITAL_COMMUNITY): Payer: BC Managed Care – PPO

## 2019-10-19 DIAGNOSIS — E876 Hypokalemia: Secondary | ICD-10-CM

## 2019-10-19 DIAGNOSIS — E1169 Type 2 diabetes mellitus with other specified complication: Secondary | ICD-10-CM

## 2019-10-19 DIAGNOSIS — E785 Hyperlipidemia, unspecified: Secondary | ICD-10-CM

## 2019-10-19 DIAGNOSIS — M7989 Other specified soft tissue disorders: Secondary | ICD-10-CM

## 2019-10-19 DIAGNOSIS — R0603 Acute respiratory distress: Secondary | ICD-10-CM

## 2019-10-19 LAB — GLUCOSE, CAPILLARY
Glucose-Capillary: 254 mg/dL — ABNORMAL HIGH (ref 70–99)
Glucose-Capillary: 233 mg/dL — ABNORMAL HIGH (ref 70–99)
Glucose-Capillary: 169 mg/dL — ABNORMAL HIGH (ref 70–99)
Glucose-Capillary: 168 mg/dL — ABNORMAL HIGH (ref 70–99)
Glucose-Capillary: 78 mg/dL (ref 70–99)
Glucose-Capillary: 139 mg/dL — ABNORMAL HIGH (ref 70–99)

## 2019-10-19 MED ORDER — DEXMEDETOMIDINE HCL IN NACL 400 MCG/100ML IV SOLN
0.4000 ug/kg/h | INTRAVENOUS | Status: DC
  Administered 2019-10-19: 1 ug/kg/h via INTRAVENOUS
  Administered 2019-10-19: 0.4 ug/kg/h via INTRAVENOUS
  Administered 2019-10-20: 0.9 ug/kg/h via INTRAVENOUS
  Administered 2019-10-20: 0.8 ug/kg/h via INTRAVENOUS
  Administered 2019-10-20 (×2): 0.9 ug/kg/h via INTRAVENOUS
  Administered 2019-10-21: 1 ug/kg/h via INTRAVENOUS
  Administered 2019-10-21 (×2): 0.9 ug/kg/h via INTRAVENOUS
  Administered 2019-10-21: 1 ug/kg/h via INTRAVENOUS
  Administered 2019-10-22: 0.8 ug/kg/h via INTRAVENOUS
  Administered 2019-10-22: 0.828 ug/kg/h via INTRAVENOUS
  Administered 2019-10-22 (×2): 0.8 ug/kg/h via INTRAVENOUS
  Administered 2019-10-23: 1 ug/kg/h via INTRAVENOUS
  Filled 2019-10-19: qty 200
  Filled 2019-10-19 (×7): qty 100
  Filled 2019-10-19: qty 200
  Filled 2019-10-19 (×4): qty 100

## 2019-10-19 MED ORDER — FENTANYL CITRATE (PF) 100 MCG/2ML IJ SOLN
50.0000 ug | INTRAMUSCULAR | Status: DC | PRN
  Administered 2019-10-20 – 2019-10-24 (×6): 50 ug via INTRAVENOUS
  Filled 2019-10-19 (×7): qty 2

## 2019-10-19 MED ORDER — BETHANECHOL CHLORIDE 10 MG PO TABS
10.0000 mg | ORAL_TABLET | Freq: Three times a day (TID) | ORAL | Status: DC
  Administered 2019-10-19 – 2019-10-21 (×9): 10 mg
  Filled 2019-10-19 (×8): qty 1

## 2019-10-19 MED ORDER — FENTANYL CITRATE (PF) 100 MCG/2ML IJ SOLN: 100.0000 ug | Freq: Once | INTRAMUSCULAR | Status: DC

## 2019-10-19 MED ORDER — VECURONIUM BROMIDE 10 MG IV SOLR: 10.0000 mg | Freq: Once | INTRAVENOUS | Status: DC

## 2019-10-19 MED ORDER — POTASSIUM CHLORIDE 20 MEQ/15ML (10%) PO SOLN: 20.0000 meq | Freq: Two times a day (BID) | ORAL | Status: DC

## 2019-10-19 MED ORDER — INSULIN GLARGINE 100 UNIT/ML ~~LOC~~ SOLN
18.0000 [IU] | Freq: Every day | SUBCUTANEOUS | Status: DC
  Administered 2019-10-19 – 2019-10-20 (×2): 18 [IU] via SUBCUTANEOUS
  Filled 2019-10-19 (×3): qty 0.18

## 2019-10-19 MED ORDER — POTASSIUM CHLORIDE 20 MEQ/15ML (10%) PO SOLN
20.0000 meq | Freq: Two times a day (BID) | ORAL | Status: AC
  Administered 2019-10-19 (×2): 20 meq
  Filled 2019-10-19 (×2): qty 15

## 2019-10-19 MED ORDER — TAMSULOSIN HCL 0.4 MG PO CAPS: 0.4000 mg | ORAL_CAPSULE | Freq: Every day | ORAL | Status: DC

## 2019-10-19 MED ORDER — LABETALOL HCL 5 MG/ML IV SOLN
INTRAVENOUS | Status: AC
  Filled 2019-10-19: qty 4

## 2019-10-19 MED ORDER — POTASSIUM CHLORIDE 10 MEQ/100ML IV SOLN
10.0000 meq | INTRAVENOUS | Status: AC
  Administered 2019-10-19 (×3): 10 meq via INTRAVENOUS
  Filled 2019-10-19 (×2): qty 100

## 2019-10-19 NOTE — Progress Notes (Signed)
Inpatient Diabetes Program Recommendations  AACE/ADA: New Consensus Statement on Inpatient Glycemic Control (2015)  Target Ranges:  Prepandial:   less than 140 mg/dL      Peak postprandial:   less than 180 mg/dL (1-2 hours)      Critically ill patients:  140 - 180 mg/dL   Lab Results  Component Value Date   GLUCAP 233 (H) 10/19/2019   HGBA1C 7.2 (H) 09/04/2019    Review of Glycemic Control Results for Corey Bennett, [name] (MRN [id number]) as of 10/19/2019 10:40  Ref. Range 10/18/2019 15:24 10/18/2019 19:28 10/18/2019 23:11 10/19/2019 03:13 10/19/2019 07:10  Glucose-Capillary Latest Ref Range: 70 - 99 mg/dL 621 (H) 308 (H) 657 (H) 254 (H) 233 (H)   Diabetes history:  DM2  Outpatient Diabetes medications:  Lantus 12 units QHS  Humalog 5 units tID if >120mg /dl Metformin 8469 mg bid  Current orders for Inpatient glycemic control: Lantus 18 units daily (increased today from 15) Novolog 0-15 units Q4H Osmolite 1.5 @ 13ml/hr  Inpatient Diabetes Program Recommendations:     Novolog 3 units tube feed coverage Q4H.  Stop if feeds are held or discontinued  Will continue to follow while inpatient.  Thank you, Dulce Sellar, RN, BSN Diabetes Coordinator Inpatient Diabetes Program [phone] (team pager from 8a-5p)

## 2019-10-19 NOTE — Progress Notes (Signed)
OT Cancellation Note  Patient Details Name: Lakeith Careaga MRN: [id number] DOB: December 30, 1960   Cancelled Treatment:    Reason Eval/Treat Not Completed: Patient not medically ready (sedated/intubated)Will follow and reassess when appropriate.   Thornell Mule, OT/L   Acute OT Clinical Specialist Acute Rehabilitation Services Pager [phone] Office [phone]  10/19/2019, 9:18 AM

## 2019-10-19 NOTE — Progress Notes (Signed)
PT Cancellation Note  Patient Details Name: Camara Rosander MRN: [id number] DOB: [birth year]   Cancelled Treatment:    Reason Eval/Treat Not Completed: Patient not medically ready;Patient's level of consciousness. Pt intubated and sedated at this time. PT will follow up when the pt is more alert and better able to participate in PT session.   Arlyss Gandy 10/19/2019, 9:14 AM

## 2019-10-19 NOTE — Progress Notes (Signed)
Upper extremity venous RT study completed  Preliminary results relayed to Junious Dresser, [place].   See CV Proc for preliminary results report.   Jean Rosenthal

## 2019-10-19 NOTE — Progress Notes (Signed)
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 Patient is living in a assisted living facility and can ambulate by himself. Patient was found by staff to have a right sided twitching with right eye deviation. He was given Versed and Dilantin and was intubated for airway protection.  Patient was extubated on 8/11 and his seizures was controlled with Keppra, phenytoin, and Vimpat.  He was transferred to [address] for medical management  On 8/14, patient continued to have seizure with twitching of the right upper extremity.  2 mg of lorazepam was given.  Patient continued to desat in the 80s on 15 L of nonrebreather mask with altered mental status.  Patient was intubated to protect airway and transferred to the ICU.  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 Large area of encephalomalacia throughout the left MCA territory, consistent with remote infarct. 8/8 CXR>>lungs clear 8/8 UDS>>+ benzo (but given versed in ED); o/w negative 8/8 MRI of brain>>large territory chronic hemorrhagic infarct in the left frontal lobe.Ill-defined hyperintensity throughout the left basal ganglia and left thalamus with mild enhancement in dobutamine likely secondary to encephalitis, infiltrating tumor or atypical subacute  infarction 8/8 MRI of face and trigeminal region>>rules out soft tissue abscess  Micro Data:  8/8 Sars-CoV-2>>negative 8/8 BCx2>> no growth 8/8 UC>> Klebsiella pneumonia 08/14 BCx>> pending 08/14 Trach Cx>> pending, gram positive rods and cocci thus far   Antimicrobials:  Vancomycin 8/8>> 8/12 Ceftriaxone 8/8>> 8/12 Ampicillin 8/8>>8/11 Unasyn 8/14 >> Vancomycin 08/14>>08/16 Interim history/subjective:  Sedated on vent, no acute distress, spontaneously moves both left upper and left lower extremity. Opens eyes to tactile stimulation.   Objective   Blood pressure (!) 143/73, pulse 84, temperature 98.9 F (37.2 C), temperature source Axillary, resp. rate (!) 25, height 6\' 1"  (1.854 m), weight 74.9 kg, SpO2 100 %.    Vent Mode: PRVC FiO2 (%):  [40 %] 40 % Set Rate:  [16 bmp] 16 bmp Vt Set:  [630 mL] 630 mL PEEP:  [8 cmH20] 8 cmH20 Plateau Pressure:  [22 cmH20-31 cmH20] 31 cmH20   Intake/Output Summary (Last 24 hours) at 10/19/2019 1020 Last data filed at 10/19/2019 0600 Gross per 24 hour  Intake 3386.7 ml  Output 1325 ml  Net 2061.7 ml   Filed Weights   10/12/19 0500 10/17/19 0450 10/18/19 0432  Weight: 71.4 kg 75.2 kg 74.9 kg    Examination: General: Breathing comfortably on vent HENT: [place]/AT; PRL; ETT Lungs: Clear bilaterally, anterior and laterally Cardiovascular: RRR, no m/r/g Abdomen: Supple, w/o guarding, +BS Extremities: No c/c/e Neuro: Sedated on propofol, opens eyes to tactile stimuli, not following any commands, pupils equal round and reactive to light, spontaneously moves left upper and left lower extremity. Withdraws right lower extremity to pain. No seizure like activity noticed during examination. GU: Foley  Assessment & Plan:  1) Acute respiratory distress requiring mechanical ventilation w/aspiration pneumonia  -Continue  ventilator.  Wean as tolerated -Sedation with propofol -Oral suction with tube care -Daily SBT and SAT -Family consenting to  tracheostomy -Trach cultures positive for gram pos rods and gram pos cocci -d/c vancomycin, continue Zosyn, today day 2 2) Status epilepticus in the setting of recent large left MCA stroke -No right arm twitching noticed today -Appreciate neurology recommendation -anti-seizure meds dosing per Neurol/Pharm 3) AKI/kypokalemia -Will replete K+ -Trend Cr with daily BMP's 4) Hypertension -Continue amlodipine 10 mg -Continue losartan 25 mg daily 5) Urinary Retention -Began Bethanechol today  6) DM -BG's in 250's -increased Lantuss from 15unit daily to 18units daily  -SSI -continue to monitor   Best practice:  Diet: TF Pain/Anxiety/Delirium protocol (if indicated): Propofol VAP protocol (if indicated): Yes DVT prophylaxis: enoxaparin GI prophylaxis: PPI Glucose control: SSI/ Lantus Mobility: BR Code Status: Full Family Communication: brother updated at bedside Disposition: ICU  Labs   CBC: Recent Labs  Lab 10/13/19 0412 10/13/19 0412 10/14/19 0816 10/15/19 0426 10/16/19 0154 10/17/19 1231 10/18/19 0554  WBC 15.4*  --  16.6* 16.6* 12.0*  --  11.8*  NEUTROABS  --   --   --   --   --   --  9.9*  HGB 9.1*   < > 9.3* 8.6* 8.5* 8.5* 7.4*  HCT 27.0*   < > 27.7* 25.6* 25.2* 25.0* 22.3*  MCV 87.9  --  87.7 86.2 85.7  --  87.5  PLT 239  --  267 306 328  --  PLATELET CLUMPS NOTED ON SMEAR, UNABLE TO ESTIMATE   < > = values in this interval not displayed.    Basic Metabolic Panel: Recent Labs  Lab 10/12/19 1425 10/12/19 1757 10/13/19 0412 10/13/19 1754 10/14/19 0816 10/15/19 0426 10/15/19 0426 10/16/19 0154 10/17/19 0303 10/17/19 1231 10/18/19 0554 10/18/19 1453  NA  --   --  138  --    < > 143   < > 139 138 140 138 139  K  --   --  3.4*  --    < > 3.1*   < > 3.3* 3.8 3.8 3.2* 3.3*  CL  --   --  106  --    < > 109  --  107 103  --  105 107  CO2  --   --  18*  --    < > 23  --  22 21*  --  21* 21*  GLUCOSE  --   --  152*  --    < > 123*  --  110* 216*  --  292*  296*  BUN  --   --  27*  --    < > 21*  --  18 19  --  29* 29*  CREATININE  --   --  1.85*  --    < > 1.53*  --  1.31* 1.56*  --  2.12* 1.85*  CALCIUM  --   --  8.4*  --    < > 8.6*  --  8.5* 8.4*  --  8.2* 8.1*  MG 1.9 1.8 1.7 2.0  --   --   --   --   --   --   --   --   PHOS 3.5 3.5 3.0 3.0  --   --   --   --   --   --   --   --    < > = values in this interval not displayed.   GFR:  Estimated Creatinine Clearance: 46.1 mL/min (A) (by C-G formula based on SCr of 1.85 mg/dL (H)). Recent Labs  Lab 10/14/19 0816 10/15/19 0426 10/16/19 0154 10/18/19 0554  WBC 16.6* 16.6* 12.0* 11.8*    Liver Function Tests: Recent Labs  Lab 10/16/19 0154  ALBUMIN 2.5*   No results for input(s): LIPASE, AMYLASE in the last 168 hours. No results for input(s): AMMONIA in the last 168 hours.  ABG    Component Value Date/Time   PHART 7.339 (L) 10/17/2019 1231   PCO2ART 36.7 10/17/2019 1231   PO2ART 117 (H) 10/17/2019 1231   HCO3 19.6 (L) 10/17/2019 1231   TCO2 21 (L) 10/17/2019 1231   ACIDBASEDEF 5.0 (H) 10/17/2019 1231   O2SAT 98.0 10/17/2019 1231     Coagulation Profile: No results for input(s): INR, PROTIME in the last 168 hours.  Cardiac Enzymes: No results for input(s): CKTOTAL, CKMB, CKMBINDEX, TROPONINI in the last 168 hours.  HbA1C: Hgb A1c MFr Bld  Date/Time Value Ref Range Status  09/04/2019 10:00 AM 7.2 (H) 4.8 - 5.6 % Final    Comment:    (NOTE) Pre diabetes:          5.7%-6.4%  Diabetes:              >6.4%  Glycemic control for   <7.0% adults with diabetes     CBG: Recent Labs  Lab 10/18/19 1524 10/18/19 1928 10/18/19 2311 10/19/19 0313 10/19/19 0710  GLUCAP 263* 339* 236* 254* 233*    Past Medical History  He,  has a past medical history of Carotid artery occlusion, Diabetes (HCC), Hypercholesterolemia, Hypertension, Paralysis (HCC), and Stroke (HCC).   Surgical History    Past Surgical History:  Procedure Laterality Date  . ENDARTERECTOMY Left  09/08/2019   Procedure: LEFT CAROTID ENDARTERECTOMY;  Surgeon: Chuck Hint, MD;  Location: [facility] OR;  Service: Vascular;  Laterality: Left;  . PATCH ANGIOPLASTY Left 09/08/2019   Procedure: PATCH ANGIOPLASTY USING XENOSURE BIOLOGIC PATCH 1x6cm;  Surgeon: Chuck Hint, MD;  Location: [facility] OR;  Service: Vascular;  Laterality: Left;  . TONSILLECTOMY       Social History   reports that he has been smoking cigarettes. He has been smoking about 1.00 pack per day. He has never used smokeless tobacco. He reports current alcohol use. He reports that he does not use drugs.   Family History   His family history is not on file.   Allergies No Known Allergies   Home Medications  Prior to Admission medications   Medication Sig Start Date End Date Taking? Authorizing Provider  amLODipine (NORVASC) 10 MG tablet Take 10 mg by mouth daily.    Yes [provider]  aspirin 81 MG chewable tablet Chew 81 mg by mouth daily.   Yes [provider]  atorvastatin (LIPITOR) 80 MG tablet Take 80 mg by mouth daily.    Yes [provider]  chlorhexidine (PERIDEX) 0.12 % solution Use as directed 15 mLs in the mouth or throat 2 (two) times daily.   Yes [provider]  Cholecalciferol (VITAMIN D-3) 125 MCG (5000 UT) TABS Take 5,000 Units by mouth daily.    Yes [provider]  clopidogrel (PLAVIX) 75 MG tablet Take 1 tablet (75 mg total) by mouth daily. 09/09/19  Yes Emilie Rutter, PA-C  HYDROcodone-acetaminophen (NORCO/VICODIN) 5-325 MG tablet Take 1 tablet by mouth every 6 (six) hours as needed for moderate pain.   Yes [provider]  insulin glargine (LANTUS) 100 UNIT/ML  injection Inject 12 Units into the skin at bedtime.   Yes [provider]  insulin lispro (HUMALOG) 100 UNIT/ML injection Inject 5 Units into the skin 3 (three) times daily before meals. *HOLD INSULIN IF BLOOD GLUCOSE IS LESS THAN 120.*   Yes [provider]    losartan (COZAAR) 25 MG tablet Take 25 mg by mouth daily.    Yes [provider]  metFORMIN (GLUCOPHAGE) 1000 MG tablet Take 1,000 mg by mouth 2 (two) times daily with a meal.    Yes [provider]  penicillin v potassium (VEETID) 500 MG tablet Take 500 mg by mouth 4 (four) times daily.   Yes [provider]  senna (SENOKOT) 8.6 MG TABS tablet Take 1 tablet by mouth daily.   Yes [provider]     Critical care time: 35 min

## 2019-10-19 NOTE — Progress Notes (Signed)
SLP Cancellation Note  Patient Details Name: Saman Umstead MRN: [id number] DOB: 09/01/1960   Cancelled treatment:       Reason Eval/Treat Not Completed: Medical issues which prohibited therapy. Pt has been intubated since last SLP visit. Will continue to follow as able.   [name]., M.A. CCC-SLP Acute Rehabilitation Services Pager [phone] Office [phone]  10/19/2019, 8:11 AM

## 2019-10-19 NOTE — Progress Notes (Signed)
Subjective: No clinical seizures overnight.  Plan to proceed with tracheostomy as patient failed extubation.  ROS: Unable to obtain due to poor mental status  Examination  Vital signs in last 24 hours: Temp:  [98.2 F (36.8 C)-98.9 F (37.2 C)] 98.9 F (37.2 C) (08/16 0400) Pulse Rate:  [70-84] 84 (08/16 0736) Resp:  [16-27] 25 (08/16 0736) BP: (98-163)/(58-92) 143/73 (08/16 0700) SpO2:  [98 %-100 %] 100 % (08/16 0737) FiO2 (%):  [40 %] 40 % (08/16 0737)  General: laying in bed, NAD CVS: pulse-normal rate and rhythm RS: breathing comfortably, intubated Extremities: normal, warm Neuro: on propofol, briefly opens eyes to noxious stimuli, PERLA, corneal reflex intact, gag reflex intact, doesn't follow commands, antigravity strength in all extremities L>R, no seizures noted   Basic Metabolic Panel: Recent Labs  Lab 10/12/19 0921 10/12/19 1425 10/12/19 1757 10/13/19 0412 10/13/19 1754 10/14/19 0816 10/15/19 0426 10/15/19 0426 10/16/19 0154 10/16/19 0154 10/17/19 0303 10/17/19 1231 10/18/19 0554 10/18/19 1453  NA   < >  --   --  138  --    < > 143   < > 139  --  138 140 138 139  K   < >  --   --  3.4*  --    < > 3.1*   < > 3.3*  --  3.8 3.8 3.2* 3.3*  CL   < >  --   --  106  --    < > 109  --  107  --  103  --  105 107  CO2   < >  --   --  18*  --    < > 23  --  22  --  21*  --  21* 21*  GLUCOSE   < >  --   --  152*  --    < > 123*  --  110*  --  216*  --  292* 296*  BUN   < >  --   --  27*  --    < > 21*  --  18  --  19  --  29* 29*  CREATININE   < >  --   --  1.85*  --    < > 1.53*  --  1.31*  --  1.56*  --  2.12* 1.85*  CALCIUM   < >  --   --  8.4*  --    < > 8.6*   < > 8.5*   < > 8.4*  --  8.2* 8.1*  MG  --  1.9 1.8 1.7 2.0  --   --   --   --   --   --   --   --   --   PHOS  --  3.5 3.5 3.0 3.0  --   --   --   --   --   --   --   --   --    < > = values in this interval not displayed.    CBC: Recent Labs  Lab 10/13/19 0412 10/13/19 0412 10/14/19 0816  10/15/19 0426 10/16/19 0154 10/17/19 1231 10/18/19 0554  WBC 15.4*  --  16.6* 16.6* 12.0*  --  11.8*  NEUTROABS  --   --   --   --   --   --  9.9*  HGB 9.1*   < > 9.3* 8.6* 8.5* 8.5* 7.4*  HCT 27.0*   < > 27.7*  25.6* 25.2* 25.0* 22.3*  MCV 87.9  --  87.7 86.2 85.7  --  87.5  PLT 239  --  267 306 328  --  PLATELET CLUMPS NOTED ON SMEAR, UNABLE TO ESTIMATE   < > = values in this interval not displayed.     Coagulation Studies: No results for input(s): LABPROT, INR in the last 72 hours.  Imaging No new brain imagin  ASSESSMENT AND PLAN: 59 year old male with left MCA stroke who presented with focal convulsive status epilepticus in setting of UTI which improved after Keppra, Dilantin and propofol... Patient was extubated however had aspiration event and had to be reintubated.  Also had recurrence of right upper extremity twitching concerning for focal motor seizures and therefore Vimpat was added.  Chronic left MCA infarct Focal motor status epilepticus ( resolved) - No more seizures overnight, on propofol for sedation  Recommendations -ContinueKeppra to 1000mg  BID (renally dosed)andDilantin at100mg  TID, Vimpat 150mg  BID - Discussed with Dr , plan to stop propofol and use either Precedex or fentanyl for sedation -Plan to proceed with tracheostomy and PEG as patient failed extubation. -If seizures return, will increase Vimpat to 200 mg twice daily -Called and discussed plan with patient's brother and brother's wife on phone. -Management of rest of comorbidities per primary team  CRITICAL CARE Performed by:   Total critical care time: 35 minutes  Critical care time was exclusive of separately billable procedures and treating other patients.  Critical care was necessary to treat or prevent imminent or life-threatening deterioration.  Critical care was time spent personally by me on the following activities: development of treatment plan with patient  and/or surrogate as well as nursing, discussions with consultants, evaluation of patient's response to treatment, examination of patient, obtaining history from patient or surrogate, ordering and performing treatments and interventions, ordering and review of laboratory studies, ordering and review of radiographic studies, pulse oximetry and re-evaluation of patient's condition.   Merrily [facility] For questions after 5pm please refer to AMION to reach the Neurologist on call

## 2019-10-20 ENCOUNTER — Inpatient Hospital Stay (HOSPITAL_COMMUNITY): Payer: BC Managed Care – PPO

## 2019-10-20 ENCOUNTER — Ambulatory Visit: Payer: BC Managed Care – PPO | Admitting: Speech Pathology

## 2019-10-20 LAB — GLUCOSE, CAPILLARY
Glucose-Capillary: 264 mg/dL — ABNORMAL HIGH (ref 70–99)
Glucose-Capillary: 163 mg/dL — ABNORMAL HIGH (ref 70–99)
Glucose-Capillary: 321 mg/dL — ABNORMAL HIGH (ref 70–99)
Glucose-Capillary: 238 mg/dL — ABNORMAL HIGH (ref 70–99)
Glucose-Capillary: 236 mg/dL — ABNORMAL HIGH (ref 70–99)
Glucose-Capillary: 288 mg/dL — ABNORMAL HIGH (ref 70–99)

## 2019-10-20 LAB — BASIC METABOLIC PANEL
Anion gap: 13 (ref 5–15)
BUN: 26 mg/dL — ABNORMAL HIGH (ref 6–20)
CO2: 18 mmol/L — ABNORMAL LOW (ref 22–32)
Calcium: 7.9 mg/dL — ABNORMAL LOW (ref 8.9–10.3)
Chloride: 109 mmol/L (ref 98–111)
Creatinine, Ser: 1.74 mg/dL — ABNORMAL HIGH (ref 0.61–1.24)
GFR calc Af Amer: 49 mL/min — ABNORMAL LOW (ref >60)
GFR calc non Af Amer: 42 mL/min — ABNORMAL LOW (ref >60)
Glucose, Bld: 333 mg/dL — ABNORMAL HIGH (ref 70–99)
Potassium: 4.3 mmol/L (ref 3.5–5.1)
Sodium: 140 mmol/L (ref 135–145)

## 2019-10-20 LAB — CULTURE, RESPIRATORY W GRAM STAIN: Culture: NORMAL

## 2019-10-20 LAB — PHENYTOIN LEVEL, TOTAL: Phenytoin Lvl: 4.3 ug/mL — ABNORMAL LOW (ref 10.0–20.0)

## 2019-10-20 LAB — OCCULT BLOOD X 1 CARD TO LAB, STOOL: Fecal Occult Bld: NEGATIVE

## 2019-10-20 IMAGING — CT CT ABDOMEN W/O CM
2 of 4 series · 16 of 46 positions shown, 18 images · non-contrast
Comparison: None.

CLINICAL DATA: Assess anatomy for gastrostomy tube placement.

EXAM:
CT ABDOMEN WITHOUT CONTRAST
TECHNIQUE: Multidetector CT imaging of the abdomen was performed following the
standard protocol without IV contrast.

[Series 3: a/p w/o 5mm · axial · non-contrast · 0.76mm/px · z∈[+1187,+1482]mm · 13 of 65 slices shown, 15 images]
[im 3/65  soft-tissue]
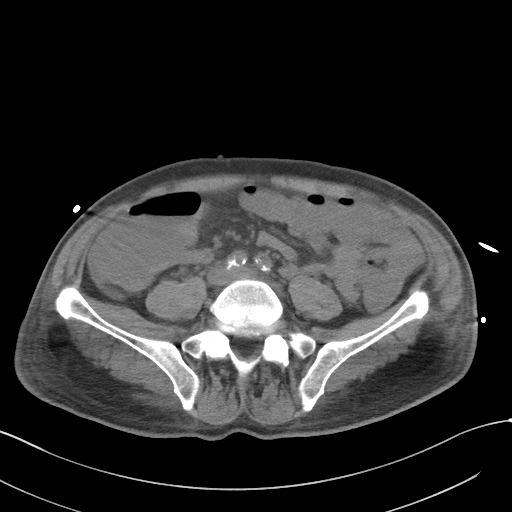
[im 3/65  bone]
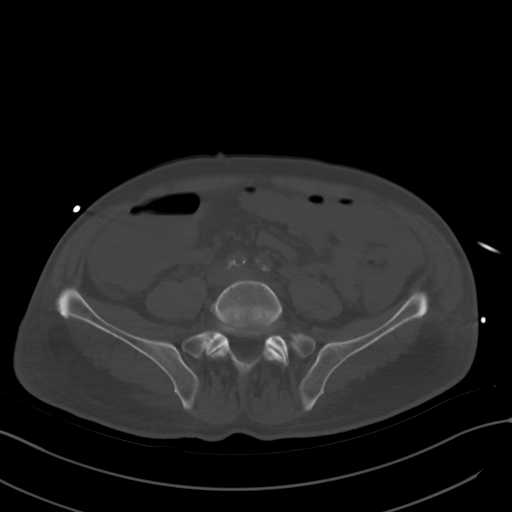
[im 9/65  soft-tissue]
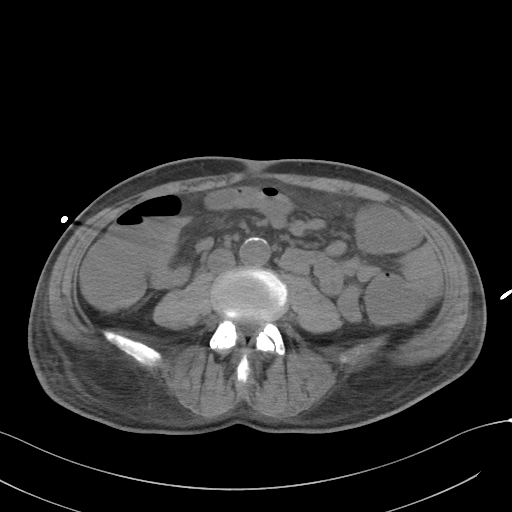
[im 15/65  soft-tissue]
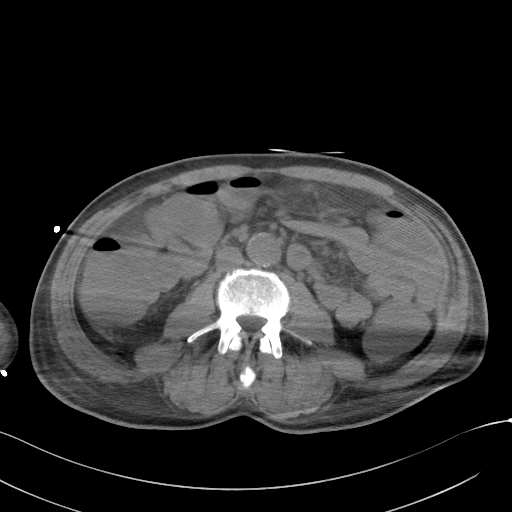
[im 18/65  soft-tissue]
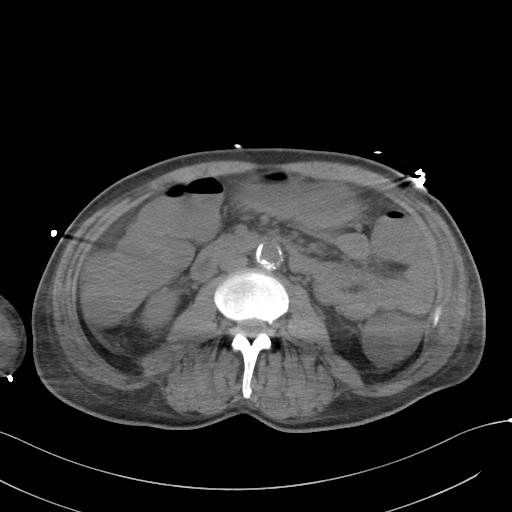
[im 24/65  soft-tissue]
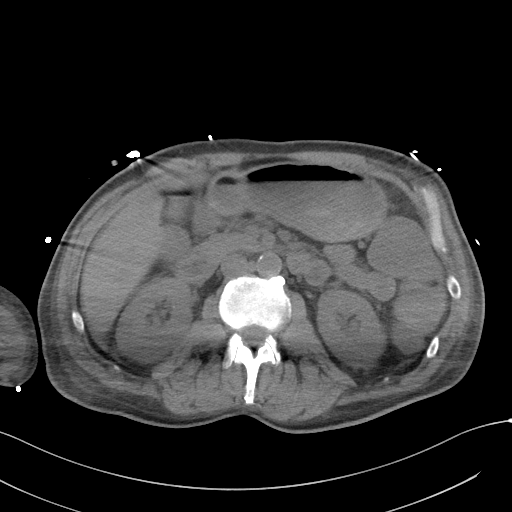
[im 27/65  soft-tissue]
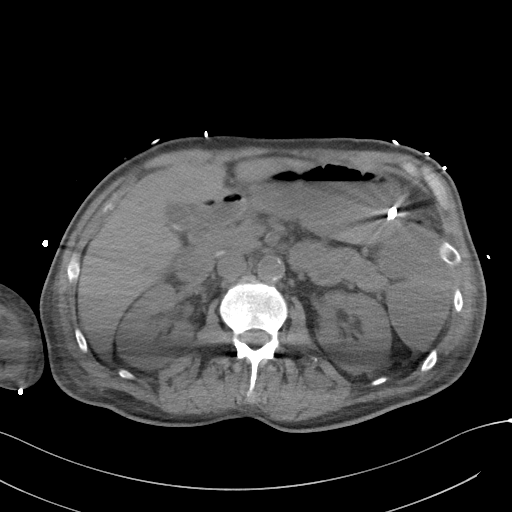
[im 33/65  soft-tissue]
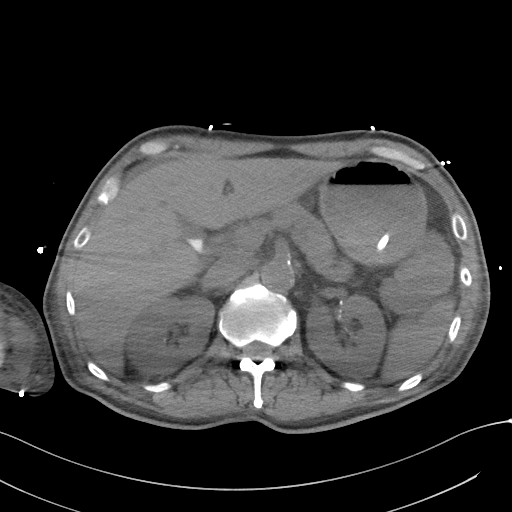
[im 38/65  soft-tissue]
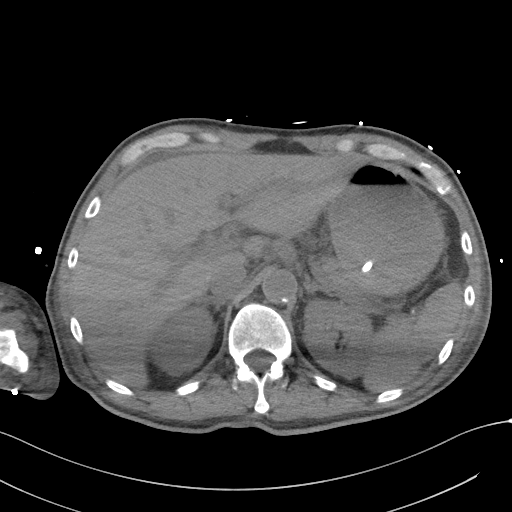
[im 41/65  soft-tissue]
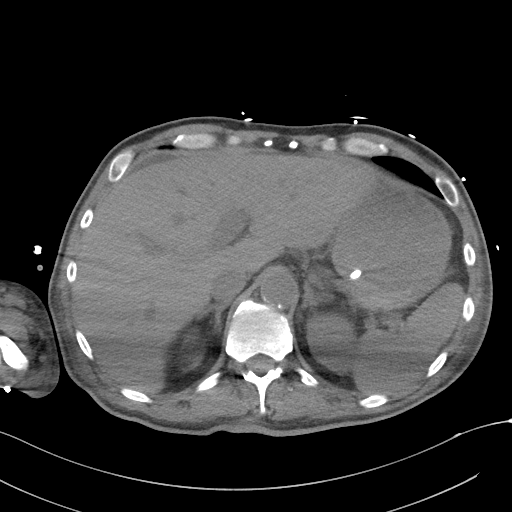
[im 41/65  bone]
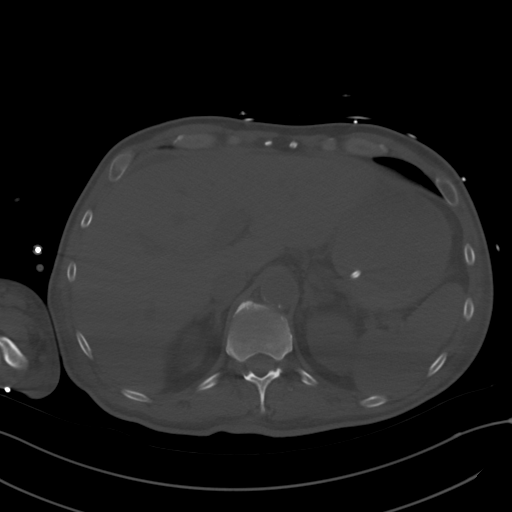
[im 47/65  soft-tissue]
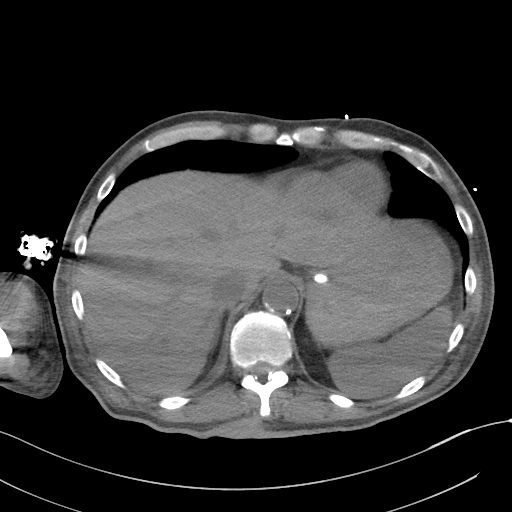
[im 50/65  soft-tissue]
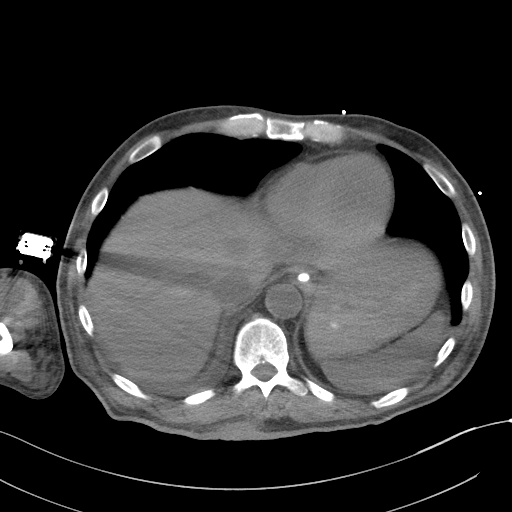
[im 56/65  soft-tissue]
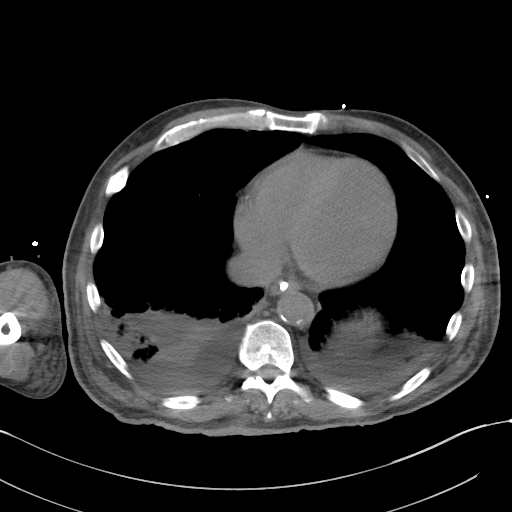
[im 62/65  soft-tissue]
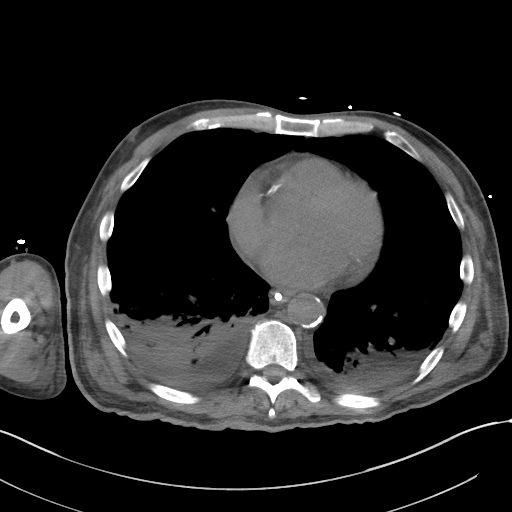

[Series 6: a/p w/o cor · coronal · non-contrast · 0.63mm/px · 3 of 141 slices shown]
[im 47/141  soft-tissue]
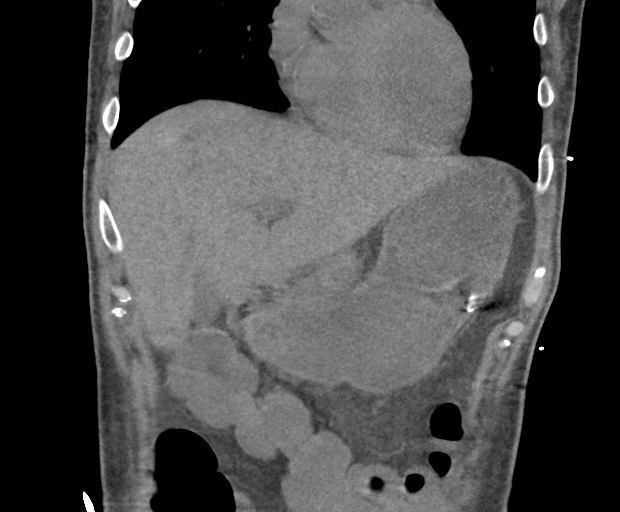
[im 63/141  soft-tissue]
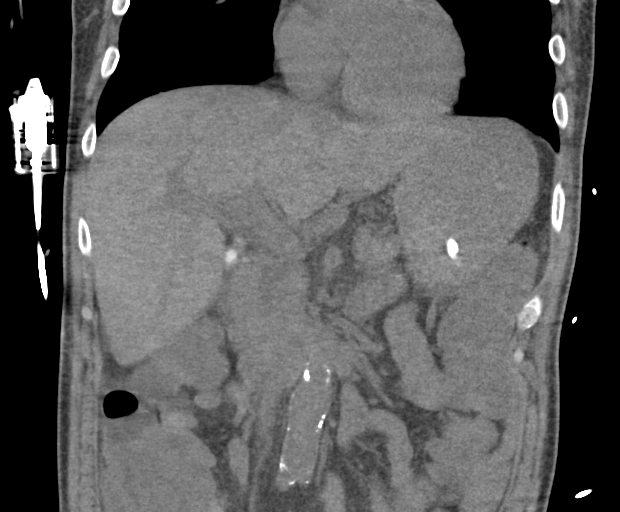
[im 78/141  soft-tissue]
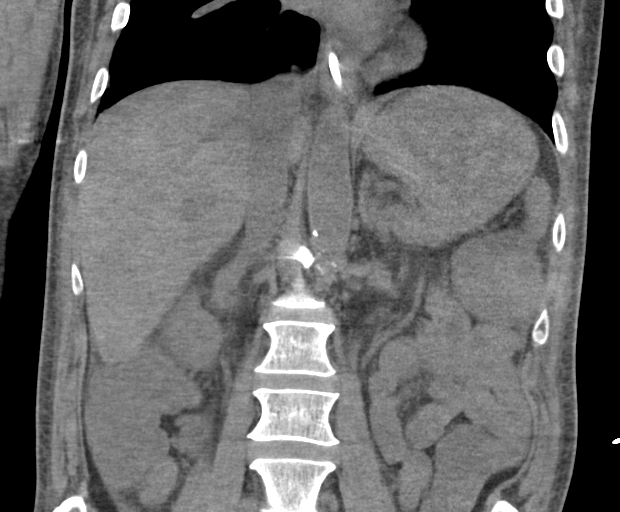

[16 of 46 positions shown; findings below may reference images not displayed]

FINDINGS: Lower chest: Small right pleural effusion. Trace left pleural fluid.
Consolidation with air bronchograms at the dependent aspect of both
lung bases.

Hepatobiliary: Trace perihepatic ascites high-density stones in the
gallbladder. No gallbladder distension.

Pancreas: Unremarkable. No pancreatic ductal dilatation or
surrounding inflammatory changes.

Spleen: Normal in size without focal abnormality.

Adrenals/Urinary Tract: Normal appearance of the adrenal glands.
Tiny calcifications associated with the kidneys without
hydronephrosis.

Stomach/Bowel: Feeding tube extends into the stomach body region.
Stomach is mildly distended. The anterior wall of the stomach is
located along the anterior abdominal wall without intervening
structures. Visualized colon is mildly distended with fluid. No
gross abnormality to the visualized small bowel.

Vascular/Lymphatic: Atherosclerotic disease of the abdominal aorta
measuring up to 2.7 cm. No significant lymph node enlargement in the
abdomen.

Other: Mild mesenteric edema in the abdomen. Trace perihepatic
ascites. Edema or trace fluid in the posterior right paracolic
region.

Musculoskeletal: Subcutaneous edema.  No acute bone abnormality.
IMPRESSION: 1. Anatomy is amendable for percutaneous gastrostomy tube placement.
2. Extensive consolidation in the dependent aspect of both lower
lobes. Pleural fluid, right side greater than left. Findings could
be associated with extensive atelectasis but cannot exclude
pneumonia or aspiration.
3. Cholelithiasis.
4. Trace ascites in the upper abdomen. Evidence for subcutaneous and
mesenteric edema.
5.  Aortic Atherosclerosis ([OI]-[OI]).
6. Small renal calcifications could represent vascular
calcifications and/or tiny nonobstructive stones.

## 2019-10-20 IMAGING — DX DG CHEST 1V PORT
1 series · 1 of 1 positions shown · non-contrast
Comparison: Radiograph [DATE]

CLINICAL DATA: Decreased oxygen saturation.

EXAM:
PORTABLE CHEST 1 VIEW

[chest]
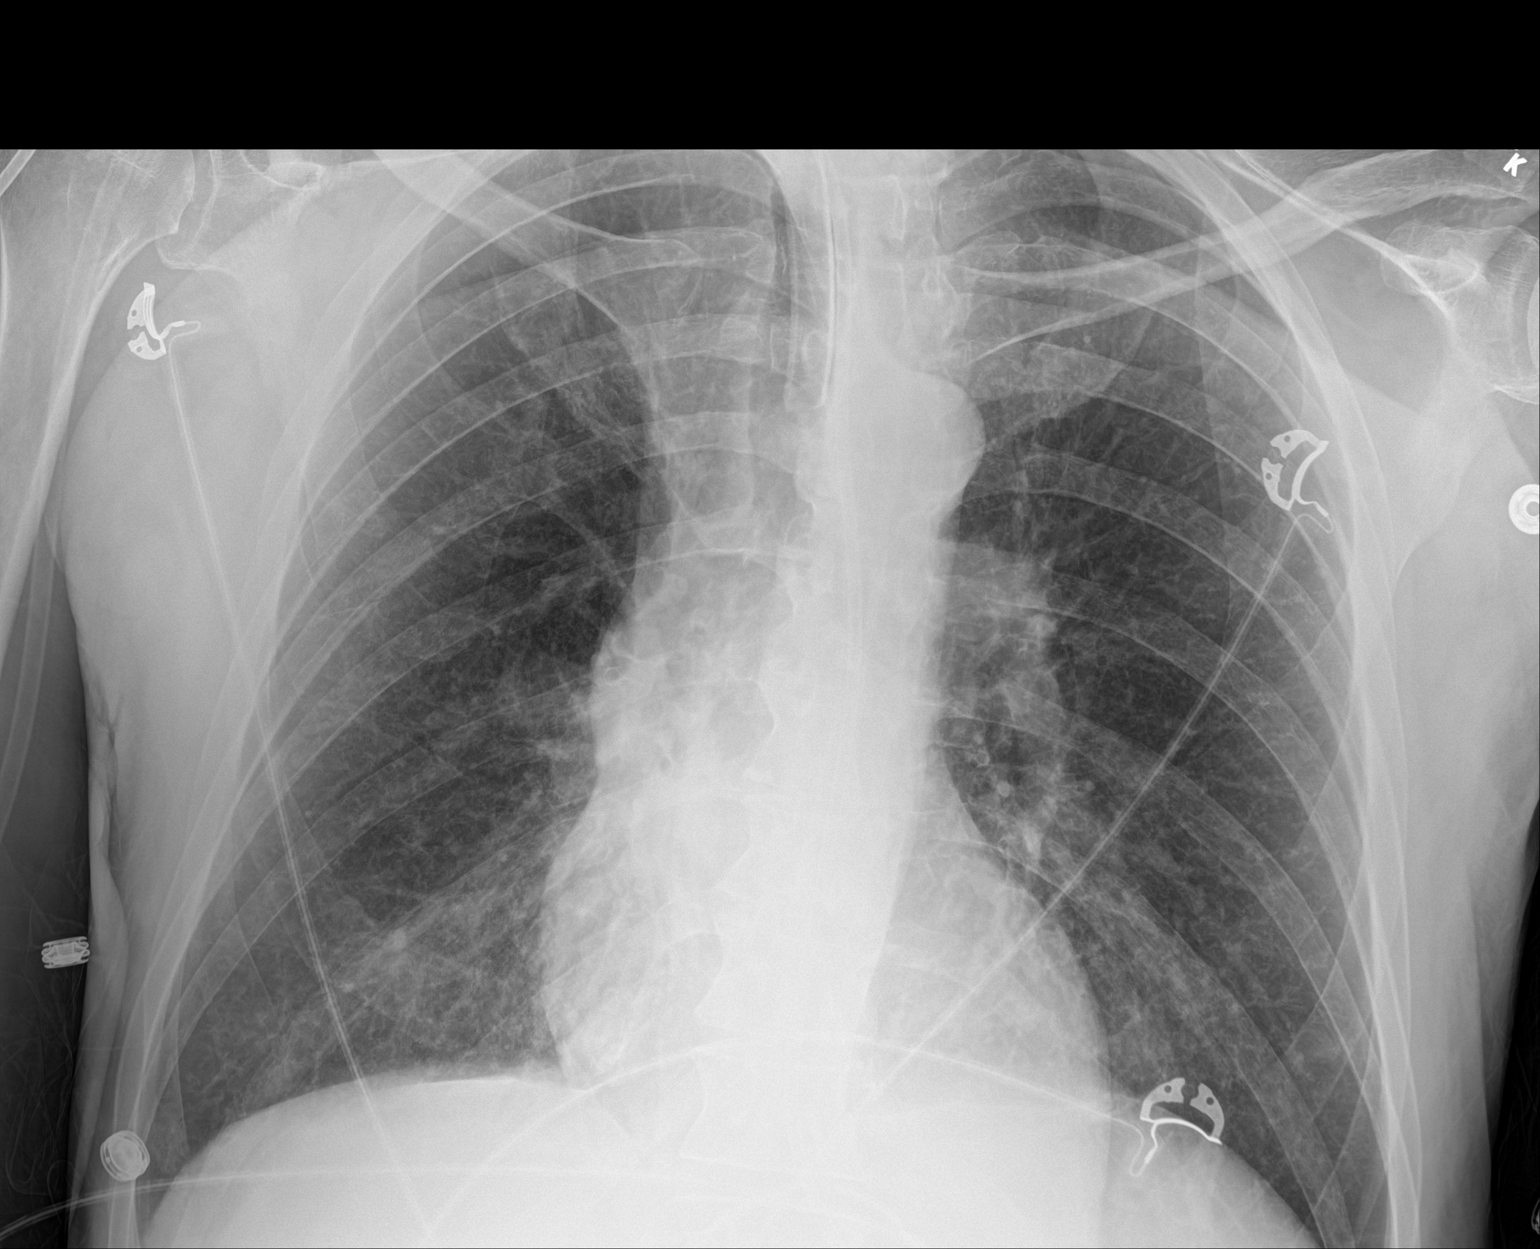

[1 of 1 positions shown; findings below may reference images not displayed]

FINDINGS: Endotracheal tube and feeding tube in good position. Lungs are
clear. No pneumothorax. Atelectasis. Improved aeration to the RIGHT
lung base.
IMPRESSION: 1. Stable support apparatus.
2. Improved aeration to the RIGHT lung base.

## 2019-10-20 MED ORDER — CHLORHEXIDINE GLUCONATE CLOTH 2 % EX PADS
6.0000 | MEDICATED_PAD | Freq: Every day | CUTANEOUS | Status: DC
  Administered 2019-10-20 – 2019-11-11 (×23): 6 via TOPICAL

## 2019-10-20 MED ORDER — IPRATROPIUM-ALBUTEROL 0.5-2.5 (3) MG/3ML IN SOLN
RESPIRATORY_TRACT | Status: AC
  Filled 2019-10-20: qty 3

## 2019-10-20 MED ORDER — GERHARDT'S BUTT CREAM
TOPICAL_CREAM | Freq: Every day | CUTANEOUS | Status: DC
  Administered 2019-10-27 – 2019-11-13 (×4): 1 via TOPICAL
  Filled 2019-10-20 (×2): qty 1

## 2019-10-20 MED ORDER — IPRATROPIUM-ALBUTEROL 0.5-2.5 (3) MG/3ML IN SOLN
3.0000 mL | Freq: Four times a day (QID) | RESPIRATORY_TRACT | Status: DC | PRN
  Administered 2019-10-20 – 2019-11-09 (×4): 3 mL via RESPIRATORY_TRACT
  Filled 2019-10-20 (×3): qty 3

## 2019-10-20 NOTE — Progress Notes (Signed)
SLP Cancellation Note  Patient Details Name: Pharoah Goggins MRN: [id number] DOB: [birth year]   Cancelled treatment:       Reason Eval/Treat Not Completed: Medical issues which prohibited therapy (remains on vent). Will continue to follow.   [name]., M.A. CCC-SLP Acute Rehabilitation Services Pager [phone] Office [phone]  10/20/2019, 8:17 AM

## 2019-10-20 NOTE — Progress Notes (Addendum)
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 Patient is living in a assisted living facility and can ambulate by himself. Patient was found by staff to have a right sided twitching with right eye deviation. He was given Versed and Dilantin and was intubated for airway protection.  Patient was extubated on 8/11 and his seizures was controlled with Keppra, phenytoin, and Vimpat.  He was transferred to 3 W. for medical management  On 8/14, patient continued to have seizure with twitching of the right upper extremity.  2 mg of lorazepam was given.  Patient continued to desat in the 80s on 15 L of nonrebreather mask with altered mental status.  Patient was intubated to protect airway and transferred to the ICU.  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 Large area of encephalomalacia throughout the left MCA territory, consistent with remote infarct. 8/8 CXR>>lungs clear 8/8 UDS>>+ benzo (but given versed in ED); o/w negative 8/8 MRI of brain>>large territory chronic hemorrhagic infarct in the left frontal lobe.Ill-defined hyperintensity throughout the left basal ganglia and left thalamus with mild enhancement in dobutamine likely secondary to encephalitis, infiltrating tumor or atypical subacute  infarction 8/8 MRI of face and trigeminal region>>rules out soft tissue abscess 08/17 CXR>> Micro Data:  8/8 Sars-CoV-2>>negative 8/8 BCx2>> no growth 8/8 UC>> Klebsiella pneumonia 08/14 BCx>> pending 08/14 Trach Cx>> pending, gram positive rods and cocci in pairs thus far   Antimicrobials:  Vancomycin 8/8>> 8/12 Ceftriaxone 8/8>> 8/12 Ampicillin 8/8>>8/11 Unasyn 8/14 >> Vancomycin 08/14>>08/16 Interim history/subjective:  Sedated on vent, patient began having acute worsening of work of breathing with tachycardia this morning. Only grimaces to pain on exam.   Objective   Blood pressure 121/63, pulse (!) 59, temperature 98.8 F (37.1 C), temperature source Axillary, resp. rate 16, height 6\' 1"  (1.854 m), weight 75.2 kg, SpO2 100 %.    Vent Mode: PRVC FiO2 (%):  [40 %] 40 % Set Rate:  [16 bmp] 16 bmp Vt Set:  [630 mL] 630 mL PEEP:  [5 cmH20] 5 cmH20 Plateau Pressure:  [15 cmH20-18 cmH20] 16 cmH20   Intake/Output Summary (Last 24 hours) at 10/20/2019 0826 Last data filed at 10/20/2019 0700 Gross per 24 hour  Intake 3892 ml  Output 1150 ml  Net 2742 ml   Filed Weights   10/17/19 0450 10/18/19 0432 10/20/19 0500  Weight: 75.2 kg 74.9 kg 75.2 kg    Examination: General: Breathing comfortably on vent HENT: [place]/AT; PRL; ETT Lungs: Clear bilaterally, anterior and laterally Cardiovascular: RRR, no m/r/g Abdomen: Supple, w/o guarding, +BS Extremities: No c/c/e Neuro: Sedated on precedex s/p 1 dose of fentanyl, only grimaces to noxious stimuli. Not following any commands. Pupils equal round and reactive. Not withdrawing any extremities to pain, no spontaneous movement observed during exam. No seizure like activity noticed during exam.  GU: Foley  Assessment & Plan:  1) Acute respiratory distress requiring  mechanical ventilation w/aspiration pneumonia  -Continue ventilator. Most likely tracheostomy Thursday or Friday. -Sedation transitioned from propofol to precedex  -Oral  suction with tube care -Daily SBT and SAT -Trach cultures positive for gram pos rods and gram pos cocci -d/c vancomycin, continue Zosyn, today day 3/7 -repeat CXR today  -PRN duonebs 2) Status epilepticus in the setting of recent large left MCA stroke -No right arm twitching noticed today -Appreciate neurology recommendation -anti-seizure meds dosing per Neurol/Pharm 3) AKI/kypokalemia -Will replete K+ as needed -Trend Cr with daily BMP's 4) Hypertension -BP within normal limits -consider adding amlodipine or losartan if becomes hypertensive  5) Urinary Retention -Began Bethanechol today  -foley d/c today with void trial 6) DM -yesterday increased Lantuss from 15unit daily to 18units daily, continue -BG's improved to 160's over last 24 hrs -SSI -continue to monitor   Best practice:  Diet: TF Pain/Anxiety/Delirium protocol (if indicated): Precedex VAP protocol (if indicated): Yes DVT prophylaxis: enoxaparin GI prophylaxis: PPI Glucose control: SSI/ Lantus Mobility: BR Code Status: Full Family Communication: Brother updated at bedside  Disposition: ICU  Labs   CBC: Recent Labs  Lab 10/14/19 0816 10/15/19 0426 10/16/19 0154 10/17/19 1231 10/18/19 0554  WBC 16.6* 16.6* 12.0*  --  11.8*  NEUTROABS  --   --   --   --  9.9*  HGB 9.3* 8.6* 8.5* 8.5* 7.4*  HCT 27.7* 25.6* 25.2* 25.0* 22.3*  MCV 87.7 86.2 85.7  --  87.5  PLT 267 306 328  --  PLATELET CLUMPS NOTED ON SMEAR, UNABLE TO ESTIMATE    Basic Metabolic Panel: Recent Labs  Lab 10/13/19 1754 10/14/19 0816 10/15/19 0426 10/15/19 0426 10/16/19 0154 10/17/19 0303 10/17/19 1231 10/18/19 0554 10/18/19 1453  NA  --    < > 143   < > 139 138 140 138 139  K  --    < > 3.1*   < > 3.3* 3.8 3.8 3.2* 3.3*  CL  --    < > 109  --  107 103  --  105 107  CO2  --    < > 23  --  22 21*  --  21* 21*  GLUCOSE  --    < > 123*  --  110* 216*  --  292* 296*  BUN  --    < > 21*  --  18 19  --  29* 29*  CREATININE  --    < >  1.53*  --  1.31* 1.56*  --  2.12* 1.85*  CALCIUM  --    < > 8.6*  --  8.5* 8.4*  --  8.2* 8.1*  MG 2.0  --   --   --   --   --   --   --   --   PHOS 3.0  --   --   --   --   --   --   --   --    < > = values in this interval not displayed.   GFR: Estimated Creatinine Clearance: 46.3 mL/min (A) (by C-G formula based on SCr of 1.85 mg/dL (H)). Recent Labs  Lab 10/14/19 0816 10/15/19 0426 10/16/19 0154 10/18/19 0554  WBC 16.6* 16.6* 12.0* 11.8*    Liver Function Tests: Recent Labs  Lab 10/16/19 0154  ALBUMIN 2.5*   No results for input(s): LIPASE, AMYLASE in the last 168 hours. No results for input(s): AMMONIA in the last 168 hours.  ABG    Component  Value Date/Time   PHART 7.339 (L) 10/17/2019 1231   PCO2ART 36.7 10/17/2019 1231   PO2ART 117 (H) 10/17/2019 1231   HCO3 19.6 (L) 10/17/2019 1231   TCO2 21 (L) 10/17/2019 1231   ACIDBASEDEF 5.0 (H) 10/17/2019 1231   O2SAT 98.0 10/17/2019 1231     Coagulation Profile: No results for input(s): INR, PROTIME in the last 168 hours.  Cardiac Enzymes: No results for input(s): CKTOTAL, CKMB, CKMBINDEX, TROPONINI in the last 168 hours.  HbA1C: Hgb A1c MFr Bld  Date/Time Value Ref Range Status  09/04/2019 10:00 AM 7.2 (H) 4.8 - 5.6 % Final    Comment:    (NOTE) Pre diabetes:          5.7%-6.4%  Diabetes:              >6.4%  Glycemic control for   <7.0% adults with diabetes     CBG: Recent Labs  Lab 10/19/19 1629 10/19/19 1915 10/19/19 2302 10/20/19 0307 10/20/19 0717  GLUCAP 168* 78 139* 264* 163*    Past Medical History  He,  has a past medical history of Carotid artery occlusion, Diabetes (HCC), Hypercholesterolemia, Hypertension, Paralysis (HCC), and Stroke (HCC).   Surgical History    Past Surgical History:  Procedure Laterality Date  . ENDARTERECTOMY Left 09/08/2019   Procedure: LEFT CAROTID ENDARTERECTOMY;  Surgeon: Chuck Hintickson, Christopher S, MD;  [facility];  Service: Vascular;  Laterality: Left;    . PATCH ANGIOPLASTY Left 09/08/2019   Procedure: PATCH ANGIOPLASTY USING XENOSURE BIOLOGIC PATCH 1x6cm;  Surgeon: Chuck Hintickson, Christopher S, MD;  [facility];  Service: Vascular;  Laterality: Left;  . TONSILLECTOMY       Social History   reports that he has been smoking cigarettes. He has been smoking about 1.00 pack per day. He has never used smokeless tobacco. He reports current alcohol use. He reports that he does not use drugs.   Family History   His family history is not on file.   Allergies No Known Allergies   Home Medications  Prior to Admission medications   Medication Sig Start Date End Date Taking? Authorizing Provider  amLODipine (NORVASC) 10 MG tablet Take 10 mg by mouth daily.    Yes [provider]  aspirin 81 MG chewable tablet Chew 81 mg by mouth daily.   Yes [provider]  atorvastatin (LIPITOR) 80 MG tablet Take 80 mg by mouth daily.    Yes [provider]  chlorhexidine (PERIDEX) 0.12 % solution Use as directed 15 mLs in the mouth or throat 2 (two) times daily.   Yes [provider]  Cholecalciferol (VITAMIN D-3) 125 MCG (5000 UT) TABS Take 5,000 Units by mouth daily.    Yes [provider]  clopidogrel (PLAVIX) 75 MG tablet Take 1 tablet (75 mg total) by mouth daily. 09/09/19  Yes Emilie RutterEveland, Matthew, PA-C  HYDROcodone-acetaminophen (NORCO/VICODIN) 5-325 MG tablet Take 1 tablet by mouth every 6 (six) hours as needed for moderate pain.   Yes [provider]  insulin glargine (LANTUS) 100 UNIT/ML injection Inject 12 Units into the skin at bedtime.   Yes [provider]  insulin lispro (HUMALOG) 100 UNIT/ML injection Inject 5 Units into the skin 3 (three) times daily before meals. *HOLD INSULIN IF BLOOD GLUCOSE IS LESS THAN 120.*   Yes [provider]  losartan (COZAAR) 25 MG tablet Take 25 mg by mouth daily.    Yes [provider]  metFORMIN (GLUCOPHAGE) 1000 MG tablet Take 1,000 mg  by mouth 2  (two) times daily with a meal.    Yes [provider]  penicillin v potassium (VEETID) 500 MG tablet Take 500 mg by mouth 4 (four) times daily.   Yes [provider]  senna (SENOKOT) 8.6 MG TABS tablet Take 1 tablet by mouth daily.   Yes [provider]     Critical care time: 35 min

## 2019-10-20 NOTE — Progress Notes (Signed)
RT note-Patient assessed for high peak pressures and increased work of breathing, patient was lavaged and moderate amount thick green, yellow secretions removed, sedation increased and xray ordered.

## 2019-10-20 NOTE — Progress Notes (Addendum)
Inpatient Diabetes Program Recommendations  AACE/ADA: New Consensus Statement on Inpatient Glycemic Control (2015)  Target Ranges:  Prepandial:   less than 140 mg/dL      Peak postprandial:   less than 180 mg/dL (1-2 hours)      Critically ill patients:  140 - 180 mg/dL   Results for Stice, Acheron "DON" (MRN [id number]) as of 10/20/2019 14:41  Ref. Range 10/19/2019 23:02 10/20/2019 03:07 10/20/2019 07:17 10/20/2019 11:31  Glucose-Capillary Latest Ref Range: 70 - 99 mg/dL 536 (H)  2 units NOVOLOG  264 (H)  8 units NOVOLOG  163 (H)  3 units NOVOLOG  321 (H)  11 units NOVOLOG +  18 units LANTUS      Home DM Meds: Lantus 12 units QHS       Humalog 5 units TID with meals (hold if CBG <120)       Metformin 1000 mg BID   Current Orders: Lantus 18 units Daily      Novolog Moderate Correction Scale/ SSI (0-15 units) Q4 hours     MD- Note patient getting Osmolite tube feeds at 55cc/hr.  Please consider adding Novolog 3 units tube feed coverage Q4H.    Stop if feeds are held or discontinued    --Will follow patient during hospitalization--  Ambrose Finland RN, MSN, CDE Diabetes Coordinator Inpatient Glycemic Control Team Team Pager: [phone] (8a-5p)

## 2019-10-20 NOTE — Progress Notes (Signed)
Patient transported to CT and back to room 4N16 without complications.

## 2019-10-20 NOTE — Progress Notes (Signed)
Physical Therapy Treatment Patient Details Name: Corey Bennett MRN: [id number] DOB: 1960-03-27 Today's Date: 10/20/2019    History of Present Illness 59 y.o. 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  He was intubated for airway protection on 10/10/19. Pt extubated on 10/14/19.  Diagnosis: status epilepticus in setting of prior large Lt MCA infarct; acute kidney injury, possible acute meningitis, HTN. PMH includes:  poorly controlled DM, dysphagia, HTN     PT Comments    Patient with decline since last session due to re-intubation after aspiration event and concern for continued seizure activity.  Plans for trach and PEG postponed due to antiplatelet therapy.  Patient minimally awake during session and some attempts to communicate with reaching for R UE during wrist ROM with L UE as if painful; also reaching for R LE seemingly in attempt to stretch during ROM activity.  Patient nodding in response to PT verbalizing exit as well.  Seems he should respond more once able to have +2 A for attempting EOB.  Remains appropriate for SNF.  Will downgrade goals based on medical decline. PT to continue to follow acutely.   Follow Up Recommendations  SNF;Supervision/Assistance - 24 hour     Equipment Recommendations  Wheelchair (measurements PT);Wheelchair cushion (measurements PT)    Recommendations for Other Services       Precautions / Restrictions Precautions Precautions: Fall Precaution Comments: aphasic     Mobility  Bed Mobility               General bed mobility comments: NT due to intubated, lethargic and no +2 A  Transfers                    Ambulation/Gait                 Stairs             Wheelchair Mobility    Modified Rankin (Stroke Patients Only)       Balance                                            Cognition  Arousal/Alertness: Lethargic Behavior During Therapy: Flat affect Overall Cognitive Status: Difficult to assess                                 General Comments: follows commands intermittently, reaching for R hand with L mitted hand when performing ROM at wrist to indicate pain; nodded in recognition of PT when exiting; eyes closed about 50% of session, when open only limited localization of face and looking toward where mobilizing      Exercises Other Exercises Other Exercises: PROM R UE/LE in supine; AAROM L UE and PROM attempting to facilitate movement without success in L LE    General Comments General comments (skin integrity, edema, etc.): Intubated with core track since last session on PRVC 40% FiO2 with PEEP of 5      Pertinent Vitals/Pain Pain Assessment: Faces Faces Pain Scale: Hurts little more Pain Location: R wrist with ROM Pain Descriptors / Indicators: Grimacing;Guarding Pain Intervention(s): Monitored during session;Limited activity within patient's tolerance    Home Living  Prior Function            PT Goals (current goals can now be found in the care plan section) Progress towards PT goals: Not progressing toward goals - comment    Frequency    Min 3X/week      PT Plan Current plan remains appropriate    Co-evaluation              AM-PAC PT "6 Clicks" Mobility   Outcome Measure  Help needed turning from your back to your side while in a flat bed without using bedrails?: Total Help needed moving from lying on your back to sitting on the side of a flat bed without using bedrails?: Total Help needed moving to and from a bed to a chair (including a wheelchair)?: Total Help needed standing up from a chair using your arms (e.g., wheelchair or bedside chair)?: Total Help needed to walk in hospital room?: Total Help needed climbing 3-5 steps with a railing? : Total 6 Click Score: 6    End of Session    Activity Tolerance: Patient limited by lethargy Patient left: in bed;with restraints reapplied   PT Visit Diagnosis: Other abnormalities of gait and mobility (R26.89);Other symptoms and signs involving the nervous system (R29.898);Muscle weakness (generalized) (M62.81)     Time: 6144-3154 PT Time Calculation (min) (ACUTE ONLY): 14 min  Charges:  $Therapeutic Exercise: 8-22 mins                     Sheran Lawless, PT Acute Rehabilitation Services Pager:[phone] [phone] 10/20/2019    Corey Bennett 10/20/2019, 8:08 PM

## 2019-10-20 NOTE — Progress Notes (Addendum)
Subjective: No clinical seizure-like episodes.  Tracheostomy was placed.  ROS: Unable to obtain due to poor mental status  Examination  Vital signs in last 24 hours: Temp:  [98.3 F (36.8 C)-99.7 F (37.6 C)] 98.8 F (37.1 C) (08/17 0800) Pulse Rate:  [58-105] 86 (08/17 0915) Resp:  [13-27] 25 (08/17 0915) BP: (96-203)/(45-115) 105/63 (08/17 0915) SpO2:  [96 %-100 %] 100 % (08/17 0920) FiO2 (%):  [40 %] 40 % (08/17 0800) Weight:  [75.2 kg] 75.2 kg (08/17 0500)  General: laying in bed, NAD CVS: pulse-normal rate and rhythm RS: breathing comfortably Extremities: normal, warm Neuro: on precedex, winces to noxious stimuli, PERLA, corneal reflex intact, gag reflex intact, doesn't follow commands, withdraws to noxious stimuli in all extremities  Basic Metabolic Panel: Recent Labs  Lab 10/13/19 1754 10/14/19 0816 10/15/19 0426 10/15/19 0426 10/16/19 0154 10/16/19 0154 10/17/19 0303 10/17/19 1231 10/18/19 0554 10/18/19 1453  NA  --    < > 143   < > 139  --  138 140 138 139  K  --    < > 3.1*   < > 3.3*  --  3.8 3.8 3.2* 3.3*  CL  --    < > 109  --  107  --  103  --  105 107  CO2  --    < > 23  --  22  --  21*  --  21* 21*  GLUCOSE  --    < > 123*  --  110*  --  216*  --  292* 296*  BUN  --    < > 21*  --  18  --  19  --  29* 29*  CREATININE  --    < > 1.53*  --  1.31*  --  1.56*  --  2.12* 1.85*  CALCIUM  --    < > 8.6*   < > 8.5*   < > 8.4*  --  8.2* 8.1*  MG 2.0  --   --   --   --   --   --   --   --   --   PHOS 3.0  --   --   --   --   --   --   --   --   --    < > = values in this interval not displayed.    CBC: Recent Labs  Lab 10/14/19 0816 10/15/19 0426 10/16/19 0154 10/17/19 1231 10/18/19 0554  WBC 16.6* 16.6* 12.0*  --  11.8*  NEUTROABS  --   --   --   --  9.9*  HGB 9.3* 8.6* 8.5* 8.5* 7.4*  HCT 27.7* 25.6* 25.2* 25.0* 22.3*  MCV 87.7 86.2 85.7  --  87.5  PLT 267 306 328  --  PLATELET CLUMPS NOTED ON SMEAR, UNABLE TO ESTIMATE     Coagulation  Studies: No results for input(s): LABPROT, INR in the last 72 hours.  Imaging No new brain imagin  ASSESSMENT AND PLAN: 59 year old male with left MCA stroke who presented with focal convulsive status epilepticus in setting of UTI which improved after Keppra, Dilantin and propofol... Patient was extubated however had aspiration event and had to be reintubated.  Also had recurrence of right upper extremity twitching concerning for focal motor seizures and therefore Vimpat was added.  Chronic left MCA infarct Focal motor status epilepticus ( resolved) - No clinical seizures overnight.  Corrected total Dilantin level on 10/18/2019:9.5  Recommendations -  ContinueKeppra to 1000mg  BID (renally dosed)andDilantin at100mg  TID, Vimpat 150mg  BID - If seizures return, can increase phenytoin  -Plavix was held for trach/PEG placement.  Will need to be resumed once procedures are completed. -Discussed plan with brother at bedside -Management of rest of comorbidities per primary team  I have spent a total of  35 minutes with the patient reviewing hospital notes,  test results, labs and examining the patient as well as establishing an assessment and plan that was discussed personally with the patient's brother.  > 50% of time was spent in direct patient care.   Epilepsy [facility] For questions after 5pm please refer to AMION to reach the Neurologist on call

## 2019-10-21 ENCOUNTER — Inpatient Hospital Stay: Payer: Self-pay

## 2019-10-21 LAB — COMPREHENSIVE METABOLIC PANEL
ALT: 36 U/L (ref 0–44)
AST: 41 U/L (ref 15–41)
Albumin: 1.8 g/dL — ABNORMAL LOW (ref 3.5–5.0)
Alkaline Phosphatase: 123 U/L (ref 38–126)
Anion gap: 10 (ref 5–15)
BUN: 24 mg/dL — ABNORMAL HIGH (ref 6–20)
CO2: 21 mmol/L — ABNORMAL LOW (ref 22–32)
Calcium: 8 mg/dL — ABNORMAL LOW (ref 8.9–10.3)
Chloride: 111 mmol/L (ref 98–111)
Creatinine, Ser: 1.67 mg/dL — ABNORMAL HIGH (ref 0.61–1.24)
GFR calc Af Amer: 51 mL/min — ABNORMAL LOW (ref >60)
GFR calc non Af Amer: 44 mL/min — ABNORMAL LOW (ref >60)
Glucose, Bld: 175 mg/dL — ABNORMAL HIGH (ref 70–99)
Potassium: 3.8 mmol/L (ref 3.5–5.1)
Sodium: 142 mmol/L (ref 135–145)
Total Bilirubin: 0.4 mg/dL (ref 0.3–1.2)
Total Protein: 5.7 g/dL — ABNORMAL LOW (ref 6.5–8.1)

## 2019-10-21 LAB — TRIGLYCERIDES: Triglycerides: 96 mg/dL (ref <150)

## 2019-10-21 LAB — GLUCOSE, CAPILLARY
Glucose-Capillary: 146 mg/dL — ABNORMAL HIGH (ref 70–99)
Glucose-Capillary: 148 mg/dL — ABNORMAL HIGH (ref 70–99)
Glucose-Capillary: 167 mg/dL — ABNORMAL HIGH (ref 70–99)
Glucose-Capillary: 189 mg/dL — ABNORMAL HIGH (ref 70–99)
Glucose-Capillary: 204 mg/dL — ABNORMAL HIGH (ref 70–99)
Glucose-Capillary: 286 mg/dL — ABNORMAL HIGH (ref 70–99)

## 2019-10-21 LAB — GLOBAL TEG PANEL
CFF Max Amplitude: 47.8 mm — ABNORMAL HIGH (ref 15–32)
CK with Heparinase (R): 5.1 min (ref 4.3–8.3)
Citrated Functional Fibrinogen: 872.3 mg/dL — ABNORMAL HIGH (ref 278–581)
Citrated Kaolin (K): 0.7 min — ABNORMAL LOW (ref 0.8–2.1)
Citrated Kaolin (MA): 74.8 mm — ABNORMAL HIGH (ref 52–69)
Citrated Kaolin (R): 4.7 min (ref 4.6–9.1)
Citrated Kaolin Angle: 81.7 deg — ABNORMAL HIGH (ref 63–78)
Citrated Rapid TEG (MA): 73.9 mm — ABNORMAL HIGH (ref 52–70)

## 2019-10-21 LAB — PHENYTOIN LEVEL, TOTAL: Phenytoin Lvl: 3 ug/mL — ABNORMAL LOW (ref 10.0–20.0)

## 2019-10-21 MED ORDER — VITAL AF 1.2 CAL PO LIQD
1000.0000 mL | ORAL | Status: DC
  Administered 2019-10-23 – 2019-10-28 (×4): 1000 mL

## 2019-10-21 MED ORDER — VECURONIUM BROMIDE 10 MG IV SOLR
10.0000 mg | Freq: Once | INTRAVENOUS | Status: AC
  Administered 2019-10-22: 10 mg via INTRAVENOUS
  Filled 2019-10-21: qty 10

## 2019-10-21 MED ORDER — SODIUM CHLORIDE 0.9% FLUSH: 10.0000 mL | INTRAVENOUS | Status: DC | PRN

## 2019-10-21 MED ORDER — SODIUM CHLORIDE 0.9% FLUSH
10.0000 mL | Freq: Two times a day (BID) | INTRAVENOUS | Status: DC
  Administered 2019-10-21 – 2019-10-28 (×13): 10 mL

## 2019-10-21 MED ORDER — PROPOFOL 10 MG/ML IV BOLUS
500.0000 mg | Freq: Once | INTRAVENOUS | Status: DC
  Filled 2019-10-21: qty 60

## 2019-10-21 MED ORDER — INSULIN GLARGINE 100 UNIT/ML ~~LOC~~ SOLN
25.0000 [IU] | Freq: Every day | SUBCUTANEOUS | Status: DC
  Administered 2019-10-21 – 2019-10-23 (×3): 25 [IU] via SUBCUTANEOUS
  Filled 2019-10-21 (×4): qty 0.25

## 2019-10-21 MED ORDER — FENTANYL CITRATE (PF) 100 MCG/2ML IJ SOLN
200.0000 ug | Freq: Once | INTRAMUSCULAR | Status: AC
  Administered 2019-10-22: 200 ug via INTRAVENOUS
  Filled 2019-10-21: qty 4

## 2019-10-21 MED ORDER — ETOMIDATE 2 MG/ML IV SOLN
40.0000 mg | Freq: Once | INTRAVENOUS | Status: AC
  Administered 2019-10-22: 20 mg via INTRAVENOUS
  Filled 2019-10-21: qty 20

## 2019-10-21 MED ORDER — INSULIN ASPART 100 UNIT/ML ~~LOC~~ SOLN
3.0000 [IU] | SUBCUTANEOUS | Status: DC
  Administered 2019-10-21 – 2019-10-23 (×12): 3 [IU] via SUBCUTANEOUS

## 2019-10-21 MED ORDER — MIDAZOLAM HCL 2 MG/2ML IJ SOLN
5.0000 mg | Freq: Once | INTRAMUSCULAR | Status: AC
  Administered 2019-10-22: 2 mg via INTRAVENOUS
  Filled 2019-10-21: qty 6

## 2019-10-21 NOTE — Progress Notes (Signed)
Nutrition Follow-up  DOCUMENTATION CODES:   Not applicable  INTERVENTION:   -D/c Osmolite 1.5 -D/c Prostat TF -D/c free water flushes -Initiate Vital AF 1.2 @ 70 ml/hr via cortrak tube (1680 ml/day)  Tube feeding regimen provides 2016 kcal (98% of needs), 126 grams of protein, and 1362 ml of H2O.   NUTRITION DIAGNOSIS:   Inadequate oral intake related to inability to eat as evidenced by NPO status.  Ongoing  GOAL:   Patient will meet greater than or equal to 90% of their needs  Progressing   MONITOR:   Vent status, Labs, Weight trends, TF tolerance, Skin, I & O's  REASON FOR ASSESSMENT:   Ventilator    ASSESSMENT:   59 y.o. 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  He was intubated for airway protection and status and PCCM consulted for admission  8/11- extubated 8/12- s/p BSE- recommend continue NPO 8/13- cortrak tube placed (tip of tube in stomach), TF initiated 8/14- re-intubated 8/17- rectal tube placed  Patient is currently intubated on ventilator support MV: 11.1 L/min Temp (24hrs), Avg:98.9 F (37.2 C), Min:97.9 F (36.6 C), Max:100.2 F (37.9 C)  Reviewed I/O's: +933 ml x 24 hours and +13.7 L since admission  UOP: 850 ml x 24 hours  Rectal tube output: 1.9 L x 24 hours  MAP: 84  Per PCCM notes, plan for trach and PEG on Friday.   Pt sitting up in bed, receiving care from RN.   Pt currently receiving TF via cortrak tube: Osmolite 1.5 @ 55 ml/hr, 45 ml Prosource TF daily, and 150 ml free water flush every 4 hours. Regimen providing 2020 kcals, 94 grams protein, and 1906 ml free water daily. This meets 98% of estimated kcal needs and 78% of estimated protein needs.   Medications reviewed and include dilantin, precedex, vimpat, lactated ringers infusion @ 75 ml/hr, and keppra.   Labs reviewed: CBGS: 236-288 (inpatient orders for glycemic  control are 0-15 units insulin aspart every 4 hours and 18 units insulin glargine daily).   Diet Order:   Diet Order            Diet NPO time specified  Diet effective midnight           Diet NPO time specified  Diet effective midnight           Diet NPO time specified  Diet effective now                 EDUCATION NEEDS:   No education needs have been identified at this time  Skin:  Skin Assessment: Reviewed RN Assessment  Last BM:  10/21/19 (100 ml via rectal tube)  Height:   Ht Readings from Last 1 Encounters:  10/10/19 6\' 1"  (1.854 m)    Weight:   Wt Readings from Last 1 Encounters:  10/21/19 79.4 kg    Ideal Body Weight:  83.6 kg  BMI:  Body mass index is 23.09 kg/m.  Estimated Nutritional Needs:   Kcal:  2066  Protein:  120-135 grams  Fluid:  > 2 L    2067, RD, LDN, CDCES Registered Dietitian II Certified Diabetes Care and Education Specialist Please refer to [facility] for RD and/or RD on-call/weekend/after hours pager

## 2019-10-21 NOTE — Progress Notes (Signed)
Occupational Therapy Progress Note  Pt tolerated chair/egress position for ~8 mins with VSS.  Less spasticity noted Rt UE today with active extension of elbow noted on command.  He requires max A for grooming activities.  He is not progressing toward goals due to medical decline, but will avoid downgrading goals until next session if needed.     10/21/19 1000  OT Visit Information  Last OT Received On 10/21/19  Assistance Needed +2  History of Present Illness 59 y.o. 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  He was intubated for airway protection on 10/10/19. Pt extubated on 10/14/19.  on 8/14, pt had continued twitching of Rt UE with AMS, and decreased 02 sats.  He was intubated.  Diagnosis: status epilepticus in setting of prior large Lt MCA infarct; acute kidney injury, possible acute meningitis, HTN. PMH includes:  poorly controlled DM, dysphagia, HTN   Precautions  Precautions Fall  Precaution Comments aphasic   Pain Assessment  Pain Assessment Faces  Faces Pain Scale 4  Pain Location R wrist with ROM  Pain Descriptors / Indicators Grimacing;Guarding  Pain Intervention(s) Monitored during session;Limited activity within patient's tolerance  Cognition  Arousal/Alertness Awake/alert  Behavior During Therapy Flat affect  Overall Cognitive Status Difficult to assess  General Comments follows commands intermittently, reaching for R hand with L mitted hand when performing ROM at wrist to indicate pain; nodded in recognition of PT when exiting; eyes closed about 50% of session, when open only limited localization of face and looking toward where mobilizing  Difficult to assess due to Impaired communication;Intubated  Upper Extremity Assessment  Upper Extremity Assessment RUE deficits/detail  RUE Deficits / Details Pt with less spasticity this date.  He is able to extend Lt elbow against  gravity.  He is able to elevate shoulder and trace elbow flexion noted.  No active flex/ext of hand noted.  Full PROM achieved   RUE Coordination decreased fine motor;decreased gross motor  Lower Extremity Assessment  Lower Extremity Assessment Defer to PT evaluation  ADL  Overall ADL's  Needs assistance/impaired  Eating/Feeding Moderate assistance;Bed level  Eating/Feeding Details (indicate cue type and reason) requires assist for thoroughness when washing face   Balance  Sitting balance-Leahy Scale Poor  Sitting balance - Comments Pt moved into chair/egress position in the bed.  He was unable to pull himself into unsupported sitting, but able to adjust posture   General Comments  General comments (skin integrity, edema, etc.) VSS. Pt orally intubated.  Tolerated egress/chair position x ~8 mins   Exercises  Exercises Other exercises  Other Exercises  Other Exercises PROM performed Rt UE shoulder flex/ext; elbow flex/ext, shoulder ER/IR; supination/pronation, wrist flex/ext, and finger flex/ext x 10 each   OT - End of Session  Equipment Utilized During Treatment Oxygen  Activity Tolerance Patient tolerated treatment well  Patient left in bed;with call bell/phone within reach;with bed alarm set;with family/visitor present  Nurse Communication Mobility status  OT Assessment/Plan  OT Plan Discharge plan remains appropriate  OT Visit Diagnosis Hemiplegia and hemiparesis;Cognitive communication deficit (R41.841)  Symptoms and signs involving cognitive functions Cerebral infarction  Hemiplegia - Right/Left Right  Hemiplegia - dominant/non-dominant Dominant  Hemiplegia - caused by Cerebral infarction  OT Frequency (ACUTE ONLY) Min 2X/week  Recommendations for Other Services Rehab consult  Follow Up Recommendations CIR  OT Equipment None recommended by OT  AM-PAC OT "6 Clicks"  Daily Activity Outcome Measure (Version 2)  Help from another person eating meals? 1  Help from another person  taking care of personal grooming? 2  Help from another person toileting, which includes using toliet, bedpan, or urinal? 1  Help from another person bathing (including washing, rinsing, drying)? 2  Help from another person to put on and taking off regular upper body clothing? 1  Help from another person to put on and taking off regular lower body clothing? 1  6 Click Score 8  OT Goal Progression  Progress towards OT goals Not progressing toward goals - comment (decline in medical condition )  Acute Rehab OT Goals  Patient Stated Goal pt unable to state   OT Goal Formulation Patient unable to participate in goal setting  Time For Goal Achievement 10/29/19  Potential to Achieve Goals Good  OT Time Calculation  OT Start Time (ACUTE ONLY) 0937  OT Stop Time (ACUTE ONLY) 1006  OT Time Calculation (min) 29 min  OT General Charges  $OT Visit 1 Visit  OT Treatments  $Therapeutic Activity 8-22 mins  $Neuromuscular Re-education 8-22 mins  Eber Jones., OTR/L Acute Rehabilitation Services Pager [phone] Office [phone]

## 2019-10-21 NOTE — Progress Notes (Signed)
Pharmacy Antibiotic Note  Corey Bennett is a 59 y.o. male admitted on 10/10/2019 with pneumonia.  Pharmacy has been consulted for piperacillin/tazobactam. On day #4 of Zosyn therapy.   SCr continues to improve. Afebrile.   Plan:  Continue Pip/tazo 3.375mg  Q 8 hr ( 4hr infusion) F/u LOT    Height: 6\' 1"  (185.4 cm) Weight: 79.4 kg (175 lb 0.7 oz) IBW/kg (Calculated) : 79.9  Temp (24hrs), Avg:98.9 F (37.2 C), Min:97.9 F (36.6 C), Max:100.2 F (37.9 C)  Recent Labs  Lab 10/15/19 0426 10/15/19 0426 10/16/19 0154 10/16/19 0154 10/17/19 0303 10/18/19 0554 10/18/19 1453 10/20/19 1142 10/21/19 0835  WBC 16.6*  --  12.0*  --   --  11.8*  --   --   --   CREATININE 1.53*   < > 1.31*   < > 1.56* 2.12* 1.85* 1.74* 1.67*   < > = values in this interval not displayed.    Estimated Creatinine Clearance: 54.1 mL/min (A) (by C-G formula based on SCr of 1.67 mg/dL (H)).    No Known Allergies  Antimicrobials this admission: Vanc 8/8 >>8/12; 8/14>>8/15 Ampicillin 8/8 >> 8/11 CTX 8/8 >>8/13 Unasyn 8/14 Pip/tazo 8/14>>   Dose adjustments this admission: 8/11 Vanc tr 26 on  750mg  Q12hr reduced to 500mg  q12 hr.  8/15 Scr up trending, Vanc to 750mg  Q24hr  Microbiology results: 8/8 BCx - Neg 8/8 UCx - 20k K PNA (pan-S except R-amp, I-macrobid) 8/8 MRSA PCR - negative 8/14 TA >> normal flora  8/15 BCx >> ngtd 8/17 TA >> pending   Thank you for allowing pharmacy to be a part of this patient's care.  10/8, PharmD., BCPS, BCCCP Clinical Pharmacist Clinical phone for 10/21/19 until 3:30pm: [phone] If after 3:30pm, please refer to [facility] for unit-specific pharmacist

## 2019-10-21 NOTE — Progress Notes (Signed)
Subjective: Continues to be on low-dose Precedex. No seizures overnight. Patient's brother [name] at bedside.   ROS: Unable to obtain due to poor mental status  Examination  Vital signs in last 24 hours: Temp:  [97.9 F (36.6 C)-99 F (37.2 C)] 99 F (37.2 C) (08/18 0800) Pulse Rate:  [58-67] 62 (08/18 0900) Resp:  [13-22] 20 (08/18 0900) BP: (91-140)/(49-76) 105/53 (08/18 0900) SpO2:  [100 %] 100 % (08/18 0900) FiO2 (%):  [30 %-40 %] 30 % (08/18 0737) Weight:  [79.4 kg] 79.4 kg (08/18 0500)  General: laying in bed, NAD CVS: pulse-normal rate and rhythm RS: breathing comfortably Extremities: normal, warm Neuro:on precedex,  awake, alert, able to follow simple commands like move his legs however not consistently, moving all 4 extremities against gravity left more than right   Basic Metabolic Panel: Recent Labs  Lab 10/17/19 0303 10/17/19 0303 10/17/19 1231 10/18/19 0554 10/18/19 0554 10/18/19 1453 10/20/19 1142 10/21/19 0835  NA 138   < > 140 138  --  139 140 142  K 3.8   < > 3.8 3.2*  --  3.3* 4.3 3.8  CL 103  --   --  105  --  107 109 111  CO2 21*  --   --  21*  --  21* 18* 21*  GLUCOSE 216*  --   --  292*  --  296* 333* 175*  BUN 19  --   --  29*  --  29* 26* 24*  CREATININE 1.56*  --   --  2.12*  --  1.85* 1.74* 1.67*  CALCIUM 8.4*   < >  --  8.2*   < > 8.1* 7.9* 8.0*   < > = values in this interval not displayed.    CBC: Recent Labs  Lab 10/15/19 0426 10/16/19 0154 10/17/19 1231 10/18/19 0554  WBC 16.6* 12.0*  --  11.8*  NEUTROABS  --   --   --  9.9*  HGB 8.6* 8.5* 8.5* 7.4*  HCT 25.6* 25.2* 25.0* 22.3*  MCV 86.2 85.7  --  87.5  PLT 306 328  --  PLATELET CLUMPS NOTED ON SMEAR, UNABLE TO ESTIMATE     Coagulation Studies: No results for input(s): LABPROT, INR in the last 72 hours.  Imaging No new brain imagin  ASSESSMENT AND PLAN:59 year old male with left MCA stroke who presented with focal convulsive status epilepticusin setting of UTIwhich  improved after Keppra, Dilantin and propofol.Marland KitchenMarland KitchenPatient was extubated however had aspiration event and had to be reintubated. Also had recurrence of right upper extremity twitching concerning for focal motor seizures and therefore Vimpat was added.  Chronic left MCA infarct Focal motor status epilepticus( resolved) -Total phenytoin level continues to drop. However patient remains seizure-free.  Recommendations -ContinueKeppra to1000mg  BID (renally dosed)andDilantin at100mg  TID, Vimpat 150mg  BID - If seizures return, can increase phenytoin  -Plavix was held for trach/PEG placement.  Will need to be resumed once procedures are completed. -Discussed plan with brother at bedside -Management of rest of comorbidities per primary team  Thank you for allowing to participate in the care of this patient. Neurology will follow peripherally. Please contact us if you have any further questions.  I have spent a total of 25 minuteswith the patient reviewing hospitalnotes,  test results, labs and examining the patient as well as establishing an assessment and plan that was discussed personally with the patient's brother.>50% of time was spent in direct patient care.   [place] [facility]  For questions after 5pm please refer to AMION to reach the Neurologist on call

## 2019-10-21 NOTE — Progress Notes (Signed)
Phenytoin Consult Indication: status epilepticus  No Known Allergies  Patient Measurements: Height: '6\' 1"'  (185.4 cm) Weight: 79.4 kg (175 lb 0.7 oz) IBW/kg (Calculated) : 79.9 TPN AdjBW (KG): 68.7 Body mass index is 23.09 kg/m.  Vital signs: Temp: 99 F (37.2 C) (08/18 0800) Temp Source: Axillary (08/18 0800) BP: 105/53 (08/18 0900) Pulse Rate: 62 (08/18 0900)  Labs: Lab Results  Component Value Date/Time   Albumin 1.8 (L) 10/21/2019 0835   Phenytoin Lvl 4.3 (L) 10/20/2019 1142   Lab Results  Component Value Date   PHENYTOIN 4.3 (L) 10/20/2019   Estimated Creatinine Clearance: 54.1 mL/min (A) (by C-G formula based on SCr of 1.67 mg/dL (H)).   Medications:  Scheduled:  . aspirin  81 mg Per Tube Daily  . atorvastatin  80 mg Per Tube Daily  . bethanechol  10 mg Per Tube TID  . chlorhexidine gluconate (MEDLINE KIT)  15 mL Mouth Rinse BID  . Chlorhexidine Gluconate Cloth  6 each Topical Daily  . enoxaparin (LOVENOX) injection  40 mg Subcutaneous Q24H  . feeding supplement (PROSource TF)  45 mL Per Tube Daily  . free water  150 mL Per Tube Q4H  . Gerhardt's butt cream   Topical Daily  . insulin aspart  0-15 Units Subcutaneous Q4H  . insulin aspart  3 Units Subcutaneous Q4H  . insulin glargine  25 Units Subcutaneous Daily  . mouth rinse  15 mL Mouth Rinse 10 times per day  . pantoprazole (PROTONIX) IV  40 mg Intravenous QHS  . phenytoin (DILANTIN) IV  100 mg Intravenous Q8H   Assessment: Corrected phenytoin level (10/20/19): 9.35 - based on albumin of 1.8  Corrected phenytoin level (10/21/19): 6.52 - based on albumin of 1.8  Seizure activity: none noted Significant potential drug interactions: none   Goals of care:  Total phenytoin level: 15-20 mcg/ml per Neurology Free phenytoin level: 1-2 mcg/ml  Plan:  -Per neurology, okay to continue current dose of phenytoin 100 mg TID since patient is seizure-free  -If patient were to experience another seizure, it is okay  to increase phenytoin dose   Albertina Parr, PharmD., BCPS, BCCCP Clinical Pharmacist Clinical phone for 10/21/19 until 3:30pm: [phone] If after 3:30pm, please refer to [facility] for unit-specific pharmacist

## 2019-10-21 NOTE — Progress Notes (Signed)
SLP Cancellation Note  Patient Details Name: Corey Bennett MRN: [id number] DOB: [birth year]   Cancelled treatment:       Reason Eval/Treat Not Completed: Medical issues which prohibited therapy. Pt remains on vent. Plans for trach postponed until Friday. Will plan to f/u next week, but can be available before then if needed.    [name]., M.A. CCC-SLP Acute Rehabilitation Services Pager [phone] Office [phone]  10/21/2019, 8:23 AM

## 2019-10-21 NOTE — Consult Note (Signed)
Chief Complaint: Patient was seen in consultation today for dysphasia/gastrostomy tube placement.  Referring Physician(s): Cheri Fowler  Supervising Physician: Ruel Favors  Patient Status: [facility] - In-pt  History of Present Illness: Corey Bennett is a 59 y.o. 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 He presented to [facility] ED 10/10/2019 with concerns for seizure-like activity. In ED, patient was intubated for airway protection and loaded with Phenytoin and Dilantin. He was admitted for further management. He was extubated 10/14/2019. Hospital course complicated by acute respiratory failure requiring re-intubation 10/17/2019. He has plans for tracheostomy placement on Friday.  IR consulted by Dr. Merrily Pew for possible image-guided percutaneous gastrostomy tube placement. Patient laying in bed intubated with sedation. He does not respond to voice and does not follow simple commands. History difficult to obtain secondary to intubation/sedation.  LD Plavix 10/20/2019. On Lovenox SQ Q24H.   Past Medical History:  Diagnosis Date  . Carotid artery occlusion   . Diabetes (HCC)   . Hypercholesterolemia   . Hypertension   . 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 Stroke [facility])     Past Surgical History:  Procedure Laterality Date  . ENDARTERECTOMY Left 09/08/2019   Procedure: LEFT CAROTID ENDARTERECTOMY;  Surgeon: Chuck Hint, MD;  Location: [facility] OR;  Service: Vascular;  Laterality: Left;  . PATCH ANGIOPLASTY Left 09/08/2019   Procedure: PATCH ANGIOPLASTY USING XENOSURE BIOLOGIC PATCH 1x6cm;  Surgeon: Chuck Hint, MD;  Location: [facility] OR;  Service: Vascular;  Laterality: Left;  . TONSILLECTOMY      Allergies: Patient has no known allergies.  Medications: Prior to Admission medications   Medication Sig Start Date End Date Taking? Authorizing Provider  amLODipine  (NORVASC) 10 MG tablet Take 10 mg by mouth daily.    Yes [provider]  aspirin 81 MG chewable tablet Chew 81 mg by mouth daily.   Yes [provider]  atorvastatin (LIPITOR) 80 MG tablet Take 80 mg by mouth daily.    Yes [provider]  chlorhexidine (PERIDEX) 0.12 % solution Use as directed 15 mLs in the mouth or throat 2 (two) times daily.   Yes [provider]  Cholecalciferol (VITAMIN D-3) 125 MCG (5000 UT) TABS Take 5,000 Units by mouth daily.    Yes [provider]  clopidogrel (PLAVIX) 75 MG tablet Take 1 tablet (75 mg total) by mouth daily. 09/09/19  Yes Emilie Rutter, PA-C  HYDROcodone-acetaminophen (NORCO/VICODIN) 5-325 MG tablet Take 1 tablet by mouth every 6 (six) hours as needed for moderate pain.   Yes [provider]  insulin glargine (LANTUS) 100 UNIT/ML injection Inject 12 Units into the skin at bedtime.   Yes [provider]  insulin lispro (HUMALOG) 100 UNIT/ML injection Inject 5 Units into the skin 3 (three) times daily before meals. *HOLD INSULIN IF BLOOD GLUCOSE IS LESS THAN 120.*   Yes [provider]  losartan (COZAAR) 25 MG tablet Take 25 mg by mouth daily.    Yes [provider]  metFORMIN (GLUCOPHAGE) 1000 MG tablet Take 1,000 mg by mouth 2 (two) times daily with a meal.    Yes [provider]  penicillin v potassium (VEETID) 500 MG tablet Take 500 mg by mouth 4 (four) times daily.   Yes [provider]  senna (SENOKOT) 8.6 MG TABS tablet Take 1 tablet by mouth daily.   Yes [provider]     No family  history on file.  Social History   Socioeconomic History  . Marital status: Single    Spouse name: Not on file  . Number of children: Not on file  . Years of education: Not on file  . Highest education level: Not on file  Occupational History  . Not on file  Tobacco Use  . Smoking status: Current Every Day Smoker    Packs/day: 1.00    Types:  Cigarettes  . Smokeless tobacco: Never Used  Vaping Use  . Vaping Use: Never used  Substance and Sexual Activity  . Alcohol use: Yes    Comment: occasional  . Drug use: Never  . Sexual activity: Not on file  Other Topics Concern  . Not on file  Social History Narrative  . Not on file   Social Determinants of Health   Financial Resource Strain:   . Difficulty of Paying Living Expenses:   Food Insecurity:   . Worried About Programme researcher, broadcasting/film/videounning Out of Food in the Last Year:   . Baristaan Out of Food in the Last Year:   Transportation Needs:   . Freight forwarderLack of Transportation (Medical):   Marland Kitchen. Lack of Transportation (Non-Medical):   Physical Activity:   . Days of Exercise per Week:   . Minutes of Exercise per Session:   Stress:   . Feeling of Stress :   Social Connections:   . Frequency of Communication with Friends and Family:   . Frequency of Social Gatherings with Friends and Family:   . Attends Religious Services:   . Active Member of Clubs or Organizations:   . Attends BankerClub or Organization Meetings:   Marland Kitchen. Marital Status:      Review of Systems: A 12 point ROS discussed and pertinent positives are indicated in the HPI above.  All other systems are negative.  Review of Systems  Unable to perform ROS: Intubated    Vital Signs: BP (!) 117/53   Pulse 65   Temp 100.2 F (37.9 C) (Axillary)   Resp 16   Ht 6\' 1"  (1.854 m)   Wt 175 lb 0.7 oz (79.4 kg)   SpO2 100%   BMI 23.09 kg/m   Physical Exam Vitals and nursing note reviewed.  Constitutional:      General: He is not in acute distress.    Comments: Intubated with sedation.  Cardiovascular:     Rate and Rhythm: Normal rate and regular rhythm.     Heart sounds: Normal heart sounds. No murmur heard.   Pulmonary:     Effort: Pulmonary effort is normal. No respiratory distress.     Breath sounds: Normal breath sounds.     Comments: Intubated with sedation. Skin:    General: Skin is warm and dry.      MD Evaluation Airway: Other  (comments) Airway comments: Intubated. Heart: WNL Abdomen: WNL Chest/ Lungs: WNL ASA  Classification: 3 Mallampati/Airway Score: Three (Intubated.)   Imaging: EEG  Result Date: 10/11/2019 Charlsie QuestYadav, Priyanka O, MD     10/11/2019  2:16 AM Patient Name: Tye SavoyDonald Flahive MRN: [id number] Epilepsy Attending: Charlsie QuestPriyanka O Yadav Referring Physician/Provider: Emilie RutterLaura Gleason, PA Date: 10/11/2019 Duration: 24.04 mins Patient history: 59 y.o. 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 EEG to evaluate for  seizure Level of alertness:  comatose AEDs during EEG study: LEV, Propofol Technical aspects: This EEG study was done with scalp electrodes positioned according to the 10-20 International system of electrode placement. Electrical activity was acquired at a sampling rate of 500Hz  and reviewed with a high frequency filter of 70Hz  and a low frequency filter of 1Hz . EEG data were recorded continuously and digitally stored. Description: EEG showed continuous generalized and lateralized 3 to 6 Hz theta-delta slowing admixed with 15 to 18 Hz beta activity with irregular morphology distributed symmetrically and diffusely.  Sharp waves were also noted in left frontotemporal region. Hyperventilation and photic stimulation were not performed.   ABNORMALITY - Sharp waves, left frontotemporal region -Continuous slow, generalized and lateralized left hemisphere -Excessive beta, generalized IMPRESSION: This study showed evidence of epileptogenicity in left frontotemporal region as well as cortical dysfunction in left hemisphere likely secondary to underlying encephalomalacia and post-ictal state. Additionally, there is severe  diffuse encephalopathy, nonspecific etiology but likely related to sedation. No seizures were seen throughout the recording. Dr Aroor was notified.   CT ABDOMEN WO CONTRAST  Result Date: 10/21/2019 CLINICAL DATA:  Assess anatomy for gastrostomy tube placement. EXAM: CT ABDOMEN WITHOUT CONTRAST TECHNIQUE: Multidetector CT imaging of the abdomen was performed following the standard protocol without IV contrast. COMPARISON:  None. FINDINGS: Lower chest: Small right pleural effusion. Trace left pleural fluid. Consolidation with air bronchograms at the dependent aspect of both lung bases. Hepatobiliary: Trace perihepatic ascites high-density stones in the gallbladder. No gallbladder distension. Pancreas: Unremarkable. No pancreatic ductal dilatation or surrounding inflammatory changes. Spleen: Normal in size without focal abnormality. Adrenals/Urinary Tract: Normal appearance of the adrenal glands. Tiny calcifications associated with the kidneys without hydronephrosis. Stomach/Bowel: Feeding tube extends into the stomach body region. Stomach is mildly distended. The anterior wall of the stomach is located along the anterior abdominal wall without intervening structures. Visualized colon is mildly distended with fluid. No gross abnormality to the visualized small bowel. Vascular/Lymphatic: Atherosclerotic disease of the abdominal aorta measuring up to 2.7 cm. No significant lymph node enlargement in the abdomen. Other: Mild mesenteric edema in the abdomen. Trace perihepatic ascites. Edema or trace fluid in the posterior right paracolic region. Musculoskeletal: Subcutaneous edema.  No acute bone abnormality. IMPRESSION: 1. Anatomy is amendable for percutaneous gastrostomy tube placement. 2. Extensive consolidation in the dependent aspect of both lower lobes. Pleural fluid, right side greater than left. Findings could be associated with extensive atelectasis but cannot exclude pneumonia or aspiration.  3. Cholelithiasis. 4. Trace ascites in the upper abdomen. Evidence for subcutaneous and mesenteric edema. 5.  Aortic Atherosclerosis (ICD10-I70.0). 6. Small renal calcifications could represent vascular calcifications and/or tiny nonobstructive stones. Electronically Signed   By: M.D.   On: 10/21/2019 09:06   DG Abd 1 View  Result Date: 10/11/2019 CLINICAL DATA:  OG tube placement EXAM: ABDOMEN - 1 VIEW COMPARISON:  None. FINDINGS: Enteric tube courses into the mid gastric body and terminates in the gastric cardia. Nonobstructive bowel gas pattern. Mild degenerative changes at L1-2. IMPRESSION: Enteric tube courses into the mid gastric body and terminates in the gastric cardia. Electronically Signed   By: Richarda Overlie M.D.   On: 10/11/2019 08:45   MR BRAIN W WO CONTRAST  Result Date: 10/11/2019 CLINICAL DATA:  Found unresponsive on floor. Rule out dental infection or maxillary abscess. Seizure. EXAM: MRI HEAD WITHOUT AND WITH CONTRAST TECHNIQUE: Multiplanar, multiecho pulse sequences of the brain and surrounding structures were obtained without  and with intravenous contrast. CONTRAST:  7mL GADAVIST GADOBUTROL 1 MMOL/ML IV SOLN COMPARISON:  CT head 10/10/2019 FINDINGS: Brain: Diffusion-weighted imaging negative for acute infarct. There is a large area of chronic infarction in the left frontal lobe with areas of chronic hemorrhage. Hypodensity in the left basal ganglia and left thalamus also present which has the appearance of ill-defined edema rather than chronic ischemia. There is mild enhancement in the left basal ganglia and also some mild cortical enhancement in the left frontal infarct following contrast infusion. Ventricle size normal. No midline shift. Mild patchy hyperintensity in the pons consistent with chronic microvascular ischemia. Negative Vascular: Normal arterial flow voids. Mucosal edema paranasal sinuses specially left maxillary sinus. Skull and upper cervical spine: No focal  skeletal lesion. Sinuses/Orbits: Bilateral cataract extraction. No orbital mass lesion. Other: None IMPRESSION: Large territory chronic hemorrhagic infarct in the left frontal lobe. There is methemoglobin in the infarct. There is mild cortical enhancement which could be due a subacute extension of the infarct. There is ill-defined hyperintensity throughout the left basal ganglia and left thalamus which is not typical for chronic infarct. In addition the left thalamus is not in the left MCA territory. There is mild enhancement in the putamen following contrast administration. This does not show restricted diffusion. Differential diagnosis includes encephalitis, infiltrating tumor, and atypical subacute infarction. Sinus mucosal disease. Electronically Signed   By: Marlan Palau M.D.   On: 10/11/2019 18:09   US RENAL  Result Date: 10/13/2019 CLINICAL DATA:  Elevated creatinine EXAM: RENAL / URINARY TRACT ULTRASOUND COMPLETE COMPARISON:  None. FINDINGS: Right Kidney: Renal measurements: 12.2 x 4.9 x 6.1 cm. = volume: 191 mL. No mass lesion or hydronephrosis is noted. Mild perinephric fluid is noted. Left Kidney: Renal measurements: 11.6 x 6.8 x 5.2 cm. = volume: 213 mL. No mass lesion or hydronephrosis is noted. Mild perinephric fluid is noted. Bladder: Decompressed Other: None. IMPRESSION: Mild perinephric fluid.  No obstructive changes are noted. Electronically Signed   By: Alcide Clever M.D.   On: 10/13/2019 10:51   DG CHEST PORT 1 VIEW  Result Date: 10/20/2019 CLINICAL DATA:  Decreased oxygen saturation. EXAM: PORTABLE CHEST 1 VIEW COMPARISON:  Radiograph 10/17/2018 FINDINGS: Endotracheal tube and feeding tube in good position. Lungs are clear. No pneumothorax. Atelectasis. Improved aeration to the RIGHT lung base. IMPRESSION: 1. Stable support apparatus. 2. Improved aeration to the RIGHT lung base. Electronically Signed   By: Genevive Bi M.D.   On: 10/20/2019 09:53   DG CHEST PORT 1 VIEW  Result  Date: 10/17/2019 CLINICAL DATA:  Intubation. EXAM: PORTABLE CHEST 1 VIEW COMPARISON:  10/17/2019 earlier. FINDINGS: Patient is rotated to the right. Interval placement of endotracheal tube with tip 6.3 cm above the carina. Two enteric tubes course into the region of the stomach and off the film as tips are not visualized. Lungs are adequately inflated demonstrate persistent hazy bilateral perihilar opacification right worse than left with interval worsening of the right midlung. Findings are likely due to infection and less likely asymmetric edema. No effusion. Cardiomediastinal silhouette and remainder of the exam is unchanged. IMPRESSION: 1. Persistent hazy bilateral perihilar opacification with interval worsening of the right midlung. Findings are likely due to infection and less likely asymmetric edema. 2. Tubes and lines as described. Electronically Signed   By: Elberta Fortis M.D.   On: 10/17/2019 12:22   DG CHEST PORT 1 VIEW  Result Date: 10/17/2019 CLINICAL DATA:  Acute respiratory distress EXAM: PORTABLE CHEST 1 VIEW COMPARISON:  10/12/2019 FINDINGS: Endotracheal tube has been removed in the interval. Feeding catheter is now seen in the stomach. Cardiac shadow is within normal limits. Diffuse airspace opacity is noted throughout the mid and lower right lung and to a lesser degree in the left lung base. These changes are consistent with acute pneumonic infiltrate. No other focal abnormality is seen. IMPRESSION: Increasing bilateral infiltrates right greater than left. Electronically Signed   By: Alcide Clever M.D.   On: 10/17/2019 05:39   DG CHEST PORT 1 VIEW  Result Date: 10/12/2019 CLINICAL DATA:  Intubated.  Orogastric tube placement. EXAM: PORTABLE CHEST 1 VIEW COMPARISON:  10/10/2019 FINDINGS: Endotracheal tube in satisfactory position. Orogastric tube tip and side hole in the gastric cardia. Normal sized heart. Clear lungs with normal vascularity. Mild thoracic spine degenerative changes.  IMPRESSION: 1. No acute abnormality. 2. Endotracheal and orogastric tubes in satisfactory position. Electronically Signed   By: Beckie Salts M.D.   On: 10/12/2019 21:22   DG Chest Portable 1 View  Result Date: 10/10/2019 CLINICAL DATA:  Post intubation EXAM: PORTABLE CHEST 1 VIEW COMPARISON:  None. FINDINGS: The heart size and mediastinal contours are within normal limits. ETT is 4 cm above the level of the carina. Aortic knob calcifications are seen. Both lungs are clear. The visualized skeletal structures are unremarkable. IMPRESSION: ETT 4 cm above the carina. Electronically Signed   By: Jonna Clark M.D.   On: 10/10/2019 23:41   DG Abd Portable 1V  Result Date: 10/16/2019 CLINICAL DATA:  Eating 2 EXAM: PORTABLE ABDOMEN - 1 VIEW COMPARISON:  October 21, 2019 FINDINGS: Incomplete assessment of the pelvis. Enteric tube tip terminates over the stomach. No dilated loops of bowel are seen. Linear bibasilar opacities likely reflecting atelectasis. Degenerative changes of the lumbar spine. IMPRESSION: 1. Enteric tube tip terminates over the stomach. 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 Electrical activity was acquired at a sampling rate of  and reviewed with a high frequency filter of  and a low frequency filter of . EEG data were recorded continuously and digitally stored.  Description: No posterior dominant rhythm was seen. Sleep was characterized by vertex waves, maximal frontocentral region. EEG showed continuous generalized polymoprhic frequencies with predominantly 5-8Hz  theta-alpha activity as well as intermittently 2-3Hz  delta activity. Patient was noted to have semi rhythmic right arm jerkingabout once every few minutes on avg. Concomiatbt eeg before, during and after the event didn't show any eeg change. Hyperventilation and photic stimulation were not performed.   ABNORMALITY -Continuousslow, generalized  IMPRESSION: This studyis suggestive ofmild to moderate diffuse encephalopathy, nonspecific etiology.No epileptiform discharges were seen throughout the recording. Patient was noted to have semi rhythmic right arm jerkingabout once every few  minutes on avg  without concomitant eeg change. However, focal motor seizures may not be seen on scalp eeg. Therefore, clinical correlation is recommended.  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 EEG to evaluate for seizure  Level of alertness:  comatose  AEDs during EEG study: LEV, PHT, Propofol  Technical aspects: This EEG study was done with scalp electrodes positioned according to the 10-20 International system of electrode placement. Electrical activity was acquired at a sampling rate of  and reviewed with a high frequency filter of  and a low frequency filter of . EEG data were recorded continuously and digitally stored.  Description: EEG showed continuous generalized and lateralized 3 to 6 Hz theta-delta slowing admixed with 15 to 18 Hz beta activity with irregular morphology distributed symmetrically and diffusely.  Sharp waves were also noted in left frontotemporal region. Hyperventilation and photic stimulation were not performed.    ABNORMALITY - Sharp waves, left frontotemporal region -Continuous slow, generalized and lateralized left hemisphere -Excessive beta, generalized  IMPRESSION: This study showed evidence of epileptogenicity in left frontotemporal region as well as cortical dysfunction in left hemisphere likely secondary to underlying encephalomalacia and post-ictal state. Additionally, there is severe diffuse encephalopathy, nonspecific etiology but likely related to sedation. No seizures were seen throughout the recording.  Priyanka Annabelle Harman   MR FACE/TRIGEMINAL WO/W CM  Result Date: 10/11/2019 CLINICAL DATA:  Found on floor unresponsive. Rule out dental infection or facial abscess. EXAM: MRI FACE WITHOUT AND WITH CONTRAST TECHNIQUE: Multiplanar, multiecho pulse sequences of the face and surrounding structures, including thin slice imaging of the course of the Trigeminal Nerves, were obtained both before and after administration of intravenous contrast. CONTRAST:  7mL GADAVIST GADOBUTROL 1 MMOL/ML IV SOLN COMPARISON:  None. FINDINGS: Normal orbit. No orbital mass or abscess or edema. Cavernous sinus normal. Optic  chiasm normal. Mucosal edema throughout the paranasal sinuses most notably in the left maxillary sinus. Endotracheal tube and NG tube identified. Parotid and submandibular glands normal bilaterally. Negative for facial abscess or edema. No pathologic lymph nodes are identified. Subcentimeter submandibular nodes are present on the left. There is a triangular-shaped a fluid collection lateral to the maxillary teeth which is most likely saliva. This does not appear to be loculated and there is no surrounding enhancement suggest abscess. Intracranial contents reported separately IMPRESSION: Negative for soft tissue abscess in the face. Mucosal edema throughout the paranasal sinuses. Electronically Signed   By: Marlan Palau M.D.   On: 10/11/2019 18:23   CT HEAD CODE STROKE WO CONTRAST  Result Date: 10/10/2019 CLINICAL DATA:  Code stroke.  Found unresponsive. EXAM: CT HEAD WITHOUT CONTRAST TECHNIQUE: Contiguous axial images were obtained from the base of the skull through the vertex without intravenous contrast. COMPARISON:  None. FINDINGS: Brain: There is no mass, hemorrhage or extra-axial collection. No evidence of acute cortical infarct. Large area of encephalomalacia throughout the left MCA territory. Vascular: No abnormal hyperdensity of the major intracranial arteries  or dural venous sinuses. No intracranial atherosclerosis. Skull: The visualized skull base, calvarium and extracranial soft tissues are normal. Sinuses/Orbits: No fluid levels or advanced mucosal thickening of the visualized paranasal sinuses. No mastoid or middle ear effusion. The orbits are normal. ASPECTS [facility] Stroke Program Early CT Score) - Ganglionic level infarction (caudate, lentiform nuclei, internal capsule, insula, M1-M3 cortex): 7 - Supraganglionic infarction (M4-M6 cortex): 3 Total score (0-10 with 10 being normal): 10 IMPRESSION: 1. No acute intracranial abnormality. 2. Large area of encephalomalacia throughout the left MCA  territory, consistent with remote infarct. 3. ASPECTS is 10. These results were communicated to Dr. Georgiana Spinner Aroor at 11:08 pm on 10/10/2019 by text page via the [facility] messaging system. Electronically Signed   By: Deatra Robinson M.D.   On: 10/10/2019 23:09   VAS [place] UPPER EXTREMITY VENOUS DUPLEX  Result Date: 10/20/2019 UPPER VENOUS STUDY  Indications: Swelling Comparison Study: No prior studies. Performing Technologist: Jean Rosenthal  Examination Guidelines: A complete evaluation includes B-mode imaging, spectral Doppler, color Doppler, and power Doppler as needed of all accessible portions of each vessel. Bilateral testing is considered an integral part of a complete examination. Limited examinations for reoccurring indications may be performed as noted.  Right Findings: +----------+------------+---------+-----------+----------+-----------------+ RIGHT     CompressiblePhasicitySpontaneousProperties     Summary      +----------+------------+---------+-----------+----------+-----------------+ IJV           Full       Yes       Yes                                +----------+------------+---------+-----------+----------+-----------------+ Subclavian    Full       Yes       Yes                                +----------+------------+---------+-----------+----------+-----------------+ Axillary      Full       Yes       Yes                                +----------+------------+---------+-----------+----------+-----------------+ Brachial      Full                                                    +----------+------------+---------+-----------+----------+-----------------+ Radial        Full                                                    +----------+------------+---------+-----------+----------+-----------------+ Ulnar         Full                                                    +----------+------------+---------+-----------+----------+-----------------+ Cephalic       None       No        No  Age Indeterminate +----------+------------+---------+-----------+----------+-----------------+ Basilic       None       No        No               Age Indeterminate +----------+------------+---------+-----------+----------+-----------------+  Left Findings: +----------+------------+---------+-----------+----------+-------+ LEFT      CompressiblePhasicitySpontaneousPropertiesSummary +----------+------------+---------+-----------+----------+-------+ Subclavian    Full       Yes       Yes                      +----------+------------+---------+-----------+----------+-------+  Summary:  Right: Findings consistent with age indeterminate superficial vein thrombosis involving the right cephalic vein and right basilic vein.  Left: No evidence of thrombosis in the subclavian.  *See table(s) above for measurements and observations.  Diagnosing physician: Waverly Ferrari MD Electronically signed by Waverly Ferrari MD on 10/20/2019 at 7:58:57 AM.    Final    [place] EKG SITE RITE  Result Date: 10/21/2019 If Site Rite image not attached, placement could not be confirmed due to current cardiac rhythm.   Labs:  CBC: Recent Labs    10/14/19 0816 10/14/19 0816 10/15/19 0426 10/16/19 0154 10/17/19 1231 10/18/19 0554  WBC 16.6*  --  16.6* 12.0*  --  11.8*  HGB 9.3*   < > 8.6* 8.5* 8.5* 7.4*  HCT 27.7*   < > 25.6* 25.2* 25.0* 22.3*  PLT 267  --  306 328  --  PLATELET CLUMPS NOTED ON SMEAR, UNABLE TO ESTIMATE   < > = values in this interval not displayed.    COAGS: Recent Labs    09/04/19 1001 10/10/19 2248  INR 1.1 1.1  APTT 35 33    BMP: Recent Labs    10/18/19 0554 10/18/19 1453 10/20/19 1142 10/21/19 0835  NA 138 139 140 142  K 3.2* 3.3* 4.3 3.8  CL 105 107 109 111  CO2 21* 21* 18* 21*  GLUCOSE 292* 296* 333* 175*  BUN 29* 29* 26* 24*  CALCIUM 8.2* 8.1* 7.9* 8.0*  CREATININE 2.12* 1.85* 1.74* 1.67*  GFRNONAA 33*  39* 42* 44*  GFRAA 39* 46* 49* 51*    LIVER FUNCTION TESTS: Recent Labs    09/04/19 1001 10/10/19 2248 10/16/19 0154 10/21/19 0835  BILITOT 1.3* 0.6  --  0.4  AST 30 23  --  41  ALT 22 21  --  36  ALKPHOS 119 118  --  123  PROT 8.1 8.2*  --  5.7*  ALBUMIN 4.3 4.1 2.5* 1.8*     Assessment and Plan:  Dysphasia in setting of large left MCA CVA, status epilepticus, and acute respiratory distress requiring mechanical ventilation with aspiration pneumonia. Plan for image-guided percutaneous gastrostomy tube placement in IR tentatively for Monday 10/26/2019 pending IR scheduling. Patient will be NPO at midnight prior to procedure. Afebrile. LD Plavix 8/17- ok to proceed on 8/23 at earliest per IR protocol. Will hold Lovenox 24 hours prior per IR protocol. INR 1.1 10/10/2019.  Risks and benefits discussed with the patient including, but not limited to the need for a barium enema during the procedure, bleeding, infection, peritonitis, or damage to adjacent structures. Attempted to call patient's mother, [name], via telephone x1. Spoke with male family member at Mrs. Clark's phone number who states Mrs. Chestine Spore will be at hospital beginning tomorrow to see her son, and requests we obtain consent in person. Will attempt to speak with patient's mother in person at later date prior to IR procedure.  Thank you for this interesting consult.  I greatly enjoyed meeting Gates Jividen and look forward to participating in their care.  A copy of this report was sent to the requesting provider on this date.  Electronically Signed: Elwin Mocha, PA-C 10/21/2019, 3:37 PM   I spent a total of 20 Minutes in face to face in clinical consultation, greater than 50% of which was counseling/coordinating care for dysphasia/gastrostomy tube placement.

## 2019-10-21 NOTE — Progress Notes (Signed)
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 Patient is living in a assisted living facility and can ambulate by himself. Patient was found by staff to have a right sided twitching with right eye deviation. He was given Versed and Dilantin and was intubated for airway protection.  Patient was extubated on 8/11 and his seizures was controlled with Keppra, phenytoin, and Vimpat.  He was transferred to 10 W. for medical management  On 8/14, patient continued to have seizure with twitching of the right upper extremity.  2 mg of lorazepam was given.  Patient continued to desat in the 80s on 15 L of nonrebreather mask with altered mental status.  Patient was intubated to protect airway and transferred to the ICU.  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 Large area of encephalomalacia throughout the left MCA territory, consistent with remote infarct. 8/8 CXR>>lungs clear 8/8 UDS>>+ benzo (but given versed in ED); o/w negative 8/8 MRI of brain>>large territory chronic hemorrhagic infarct in the left frontal lobe.Ill-defined hyperintensity throughout the left basal ganglia and left thalamus with mild enhancement in dobutamine likely secondary to encephalitis, infiltrating tumor or atypical subacute  infarction 8/8 MRI of face and trigeminal region>>rules out soft tissue abscess 08/17 CXR>>improved aeration to the right lung base  Micro Data:  8/8 Sars-CoV-2>>negative 8/8 BCx2>> no growth 8/8 UC>> Klebsiella pneumonia 08/14 BCx>> pending/negative  08/14 Trach Cx>> pending, gram positive rods and cocci in pairs thus far   Antimicrobials:  Vancomycin 8/8>> 8/12 Ceftriaxone 8/8>> 8/12 Ampicillin 8/8>>8/11 Zosyn 8/14 >> Vancomycin 08/14>>08/16 Interim history/subjective:  On vent, comfortable, brother at bedside. Patient opens eyes to verbal stimuli. Able to answer questions via nodding. Denies that he is in any pain. Updated on plan for tracheostomy and PEG, patient nods in understanding.   Objective   Blood pressure (!) 105/53, pulse 62, temperature 99 F (37.2 C), temperature source Axillary, resp. rate 20, height 6\' 1"  (1.854 m), weight 79.4 kg, SpO2 100 %.    Vent Mode: PRVC FiO2 (%):  [30 %-40 %] 30 % Set Rate:  [16 bmp] 16 bmp Vt Set:  [630 mL] 630 mL PEEP:  [5 cmH20] 5 cmH20 Plateau Pressure:  [13 cmH20-15 cmH20] 14 cmH20   Intake/Output Summary (Last 24 hours) at 10/21/2019 0959 Last data filed at 10/21/2019 0900 Gross per 24 hour  Intake 3271.56 ml  Output 2750 ml  Net 521.56 ml   Filed Weights   10/18/19 0432 10/20/19 0500 10/21/19 0500  Weight: 74.9 kg 75.2 kg 79.4 kg    Examination: General: Breathing comfortably on vent, no acute distress HENT: [place]/AT; PRL; ETT Lungs: Clear bilaterally, anterior and laterally Cardiovascular: RRR, no m/r/g Abdomen: Supple, w/o guarding, +BS Extremities: Mild non-pitting edema of lower extremities bilaterally  Neuro: On precedex, opens eyes to verbal stimuli, follows/tracks. Follows commands, nods to answer questions appropriately. Pupils equal round and reactive. Moves left  upper and lower extremity. Unable to move right upper and lower extremity to pain. No seizure like activity noticed during exam.  GU:  Foley  Assessment & Plan:  1) Acute respiratory distress requiring mechanical ventilation w/aspiration pneumonia  -Continue ventilator. Most likely tracheostomy Thursday or Friday-awaiting ENT. Plan for PEG at the same time. Plavis held beginning 08/16.  -Continue precedex  -Oral suction with tube care -Daily SBT and SAT -Trach cultures positive for gram pos rods and gram pos cocci -repeat CXR yesterday shows much improved infiltrate on right side  -continue Zosyn, today day 4/7 2) Status epilepticus in the setting of recent large left MCA stroke -No right arm twitching noticed since transfer to ICU.  -Appreciate neurology recommendation -anti-seizure meds dosing per Neurol/Pharm 3) AKI/kypokalemia -Cr improved 1.74<1.85 -Continue LR 55ml/hr today -Will replete K+ as needed -Trend Cr with daily BMP's 4) Hypertension -BP within normal limits -consider adding amlodipine or losartan if becomes hypertensive  5) Urinary Retention -Bethanechol for 48 hrs today, d/c bethanechol today  -foley d/c today with voiding trial 6) DM -BG in upper 200's-300 over last 24 hrs -increase Lantus 18 units daily to 25 units daily  -adding mealtime insulin at 3 units q4hrs +SSI -continue to monitor   Best practice:  Diet: TF Pain/Anxiety/Delirium protocol (if indicated): Precedex VAP protocol (if indicated): Yes DVT prophylaxis: enoxaparin GI prophylaxis: PPI Glucose control: SSI/ Lantus Mobility: BR Code Status: Full Family Communication: Brother updated at bedside  Disposition: ICU  Labs   CBC: Recent Labs  Lab 10/15/19 0426 10/16/19 0154 10/17/19 1231 10/18/19 0554  WBC 16.6* 12.0*  --  11.8*  NEUTROABS  --   --   --  9.9*  HGB 8.6* 8.5* 8.5* 7.4*  HCT 25.6* 25.2* 25.0* 22.3*  MCV 86.2 85.7  --  87.5  PLT 306 328  --  PLATELET CLUMPS NOTED ON SMEAR, UNABLE TO ESTIMATE    Basic Metabolic Panel: Recent Labs  Lab 10/17/19 0303 10/17/19 0303 10/17/19 1231 10/18/19 0554  10/18/19 1453 10/20/19 1142 10/21/19 0835  NA 138   < > 140 138 139 140 142  K 3.8   < > 3.8 3.2* 3.3* 4.3 3.8  CL 103  --   --  105 107 109 111  CO2 21*  --   --  21* 21* 18* 21*  GLUCOSE 216*  --   --  292* 296* 333* 175*  BUN 19  --   --  29* 29* 26* 24*  CREATININE 1.56*  --   --  2.12* 1.85* 1.74* 1.67*  CALCIUM 8.4*  --   --  8.2* 8.1* 7.9* 8.0*   < > = values in this interval not displayed.   GFR: Estimated Creatinine Clearance: 54.1 mL/min (A) (by C-G formula based on SCr of 1.67 mg/dL (H)). Recent Labs  Lab 10/15/19 0426 10/16/19 0154 10/18/19 0554  WBC 16.6* 12.0* 11.8*    Liver Function Tests: Recent Labs  Lab 10/16/19 0154 10/21/19 0835  AST  --  41  ALT  --  36  ALKPHOS  --  123  BILITOT  --  0.4  PROT  --  5.7*  ALBUMIN 2.5* 1.8*   No results for input(s): LIPASE, AMYLASE in the last 168 hours. No results for input(s): AMMONIA in the last 168 hours.  ABG    Component Value Date/Time   PHART 7.339 (L) 10/17/2019 1231   PCO2ART 36.7 10/17/2019 1231   PO2ART 117 (H) 10/17/2019 1231   HCO3  19.6 (L) 10/17/2019 1231   TCO2 21 (L) 10/17/2019 1231   ACIDBASEDEF 5.0 (H) 10/17/2019 1231   O2SAT 98.0 10/17/2019 1231     Coagulation Profile: No results for input(s): INR, PROTIME in the last 168 hours.  Cardiac Enzymes: No results for input(s): CKTOTAL, CKMB, CKMBINDEX, TROPONINI in the last 168 hours.  HbA1C: Hgb A1c MFr Bld  Date/Time Value Ref Range Status  09/04/2019 10:00 AM 7.2 (H) 4.8 - 5.6 % Final    Comment:    (NOTE) Pre diabetes:          5.7%-6.4%  Diabetes:              >6.4%  Glycemic control for   <7.0% adults with diabetes     CBG: Recent Labs  Lab 10/20/19 1507 10/20/19 1915 10/20/19 2306 10/21/19 0306 10/21/19 0734  GLUCAP 238* 236* 288* 286* 204*    Past Medical History  He,  has a past medical history of Carotid artery occlusion, Diabetes (HCC), Hypercholesterolemia, Hypertension, Paralysis (HCC), and Stroke  (HCC).   Surgical History    Past Surgical History:  Procedure Laterality Date  . ENDARTERECTOMY Left 09/08/2019   Procedure: LEFT CAROTID ENDARTERECTOMY;  Surgeon: Chuck Hint, MD;  Location: [facility] OR;  Service: Vascular;  Laterality: Left;  . PATCH ANGIOPLASTY Left 09/08/2019   Procedure: PATCH ANGIOPLASTY USING XENOSURE BIOLOGIC PATCH 1x6cm;  Surgeon: Chuck Hint, MD;  Location: [facility] OR;  Service: Vascular;  Laterality: Left;  . TONSILLECTOMY       Social History   reports that he has been smoking cigarettes. He has been smoking about 1.00 pack per day. He has never used smokeless tobacco. He reports current alcohol use. He reports that he does not use drugs.   Family History   His family history is not on file.   Allergies No Known Allergies   Home Medications  Prior to Admission medications   Medication Sig Start Date End Date Taking? Authorizing Provider  amLODipine (NORVASC) 10 MG tablet Take 10 mg by mouth daily.    Yes [provider]  aspirin 81 MG chewable tablet Chew 81 mg by mouth daily.   Yes [provider]  atorvastatin (LIPITOR) 80 MG tablet Take 80 mg by mouth daily.    Yes [provider]  chlorhexidine (PERIDEX) 0.12 % solution Use as directed 15 mLs in the mouth or throat 2 (two) times daily.   Yes [provider]  Cholecalciferol (VITAMIN D-3) 125 MCG (5000 UT) TABS Take 5,000 Units by mouth daily.    Yes [provider]  clopidogrel (PLAVIX) 75 MG tablet Take 1 tablet (75 mg total) by mouth daily. 09/09/19  Yes Emilie Rutter, PA-C  HYDROcodone-acetaminophen (NORCO/VICODIN) 5-325 MG tablet Take 1 tablet by mouth every 6 (six) hours as needed for moderate pain.   Yes [provider]  insulin glargine (LANTUS) 100 UNIT/ML injection Inject 12 Units into the skin at bedtime.   Yes [provider]  insulin lispro (HUMALOG) 100 UNIT/ML injection Inject 5 Units into the skin 3 (three) times  daily before meals. *HOLD INSULIN IF BLOOD GLUCOSE IS LESS THAN 120.*   Yes [provider]  losartan (COZAAR) 25 MG tablet Take 25 mg by mouth daily.    Yes [provider]  metFORMIN (GLUCOPHAGE) 1000 MG tablet Take 1,000 mg by mouth 2 (two) times daily with a meal.    Yes [provider]  penicillin v potassium (VEETID) 500 MG tablet Take  500 mg by mouth 4 (four) times daily.   Yes [provider]  senna (SENOKOT) 8.6 MG TABS tablet Take 1 tablet by mouth daily.   Yes [provider]     Critical care time: 35 min

## 2019-10-21 NOTE — Progress Notes (Signed)
Peripherally Inserted Central Catheter Placement  The IV Nurse has discussed with the patient and/or persons authorized to consent for the patient, the purpose of this procedure and the potential benefits and risks involved with this procedure.  The benefits include less needle sticks, lab draws from the catheter, and the patient may be discharged home with the catheter. Risks include, but not limited to, infection, bleeding, blood clot (thrombus formation), and puncture of an artery; nerve damage and irregular heartbeat and possibility to perform a PICC exchange if needed/ordered by physician.  Alternatives to this procedure were also discussed.  Bard Power PICC patient education guide, fact sheet on infection prevention and patient information card has been provided to patient /or left at bedside.    PICC Placement Documentation  PICC Double Lumen 10/21/19 PICC Left Basilic 48 cm 0 cm (Active)  Indication for Insertion or Continuance of Line Prolonged intravenous therapies 10/21/19 1658  Exposed Catheter (cm) 0 cm 10/21/19 1658  Site Assessment Clean;Dry;Intact 10/21/19 1658  Lumen #1 Status Flushed;Blood return noted 10/21/19 1658  Lumen #2 Status Flushed;Blood return noted 10/21/19 1658  Dressing Type Transparent 10/21/19 1658  Dressing Status Clean;Dry;Intact;Antimicrobial disc in place;Other (Comment) 10/21/19 1658  Dressing Intervention New dressing 10/21/19 1658  Dressing Change Due 10/28/19 10/21/19 1658   Telephone consent signed by brother    Maximino Greenland 10/21/2019, 4:59 PM

## 2019-10-22 ENCOUNTER — Ambulatory Visit: Payer: BC Managed Care – PPO | Admitting: Speech Pathology

## 2019-10-22 ENCOUNTER — Inpatient Hospital Stay (HOSPITAL_COMMUNITY): Payer: BC Managed Care – PPO

## 2019-10-22 LAB — CBC
HCT: 22.7 % — ABNORMAL LOW (ref 39.0–52.0)
Hemoglobin: 7.1 g/dL — ABNORMAL LOW (ref 13.0–17.0)
MCH: 28.6 pg (ref 26.0–34.0)
MCHC: 31.3 g/dL (ref 30.0–36.0)
MCV: 91.5 fL (ref 80.0–100.0)
Platelets: 401 10*3/uL — ABNORMAL HIGH (ref 150–400)
RBC: 2.48 MIL/uL — ABNORMAL LOW (ref 4.22–5.81)
RDW: 14.8 % (ref 11.5–15.5)
WBC: 7 10*3/uL (ref 4.0–10.5)
nRBC: 0 % (ref 0.0–0.2)

## 2019-10-22 LAB — BASIC METABOLIC PANEL
Anion gap: 8 (ref 5–15)
BUN: 24 mg/dL — ABNORMAL HIGH (ref 6–20)
CO2: 23 mmol/L (ref 22–32)
Calcium: 8 mg/dL — ABNORMAL LOW (ref 8.9–10.3)
Chloride: 110 mmol/L (ref 98–111)
Creatinine, Ser: 1.53 mg/dL — ABNORMAL HIGH (ref 0.61–1.24)
GFR calc Af Amer: 57 mL/min — ABNORMAL LOW (ref >60)
GFR calc non Af Amer: 49 mL/min — ABNORMAL LOW (ref >60)
Glucose, Bld: 129 mg/dL — ABNORMAL HIGH (ref 70–99)
Potassium: 3.4 mmol/L — ABNORMAL LOW (ref 3.5–5.1)
Sodium: 141 mmol/L (ref 135–145)

## 2019-10-22 LAB — GLUCOSE, CAPILLARY
Glucose-Capillary: 107 mg/dL — ABNORMAL HIGH (ref 70–99)
Glucose-Capillary: 117 mg/dL — ABNORMAL HIGH (ref 70–99)
Glucose-Capillary: 123 mg/dL — ABNORMAL HIGH (ref 70–99)
Glucose-Capillary: 154 mg/dL — ABNORMAL HIGH (ref 70–99)
Glucose-Capillary: 167 mg/dL — ABNORMAL HIGH (ref 70–99)
Glucose-Capillary: 98 mg/dL (ref 70–99)

## 2019-10-22 LAB — PROTIME-INR
INR: 1.2 (ref 0.8–1.2)
Prothrombin Time: 14.5 seconds (ref 11.4–15.2)

## 2019-10-22 IMAGING — DX DG CHEST 1V PORT
1 series · 2 of 2 positions shown · non-contrast
Comparison: Portable chest [DATE] and earlier.

CLINICAL DATA: 58-year-old male tracheostomy placement.

EXAM:
PORTABLE CHEST 1 VIEW

[Series 1: chest · 0.14mm/px · 2 of 2 slices shown]
[im 1/2]
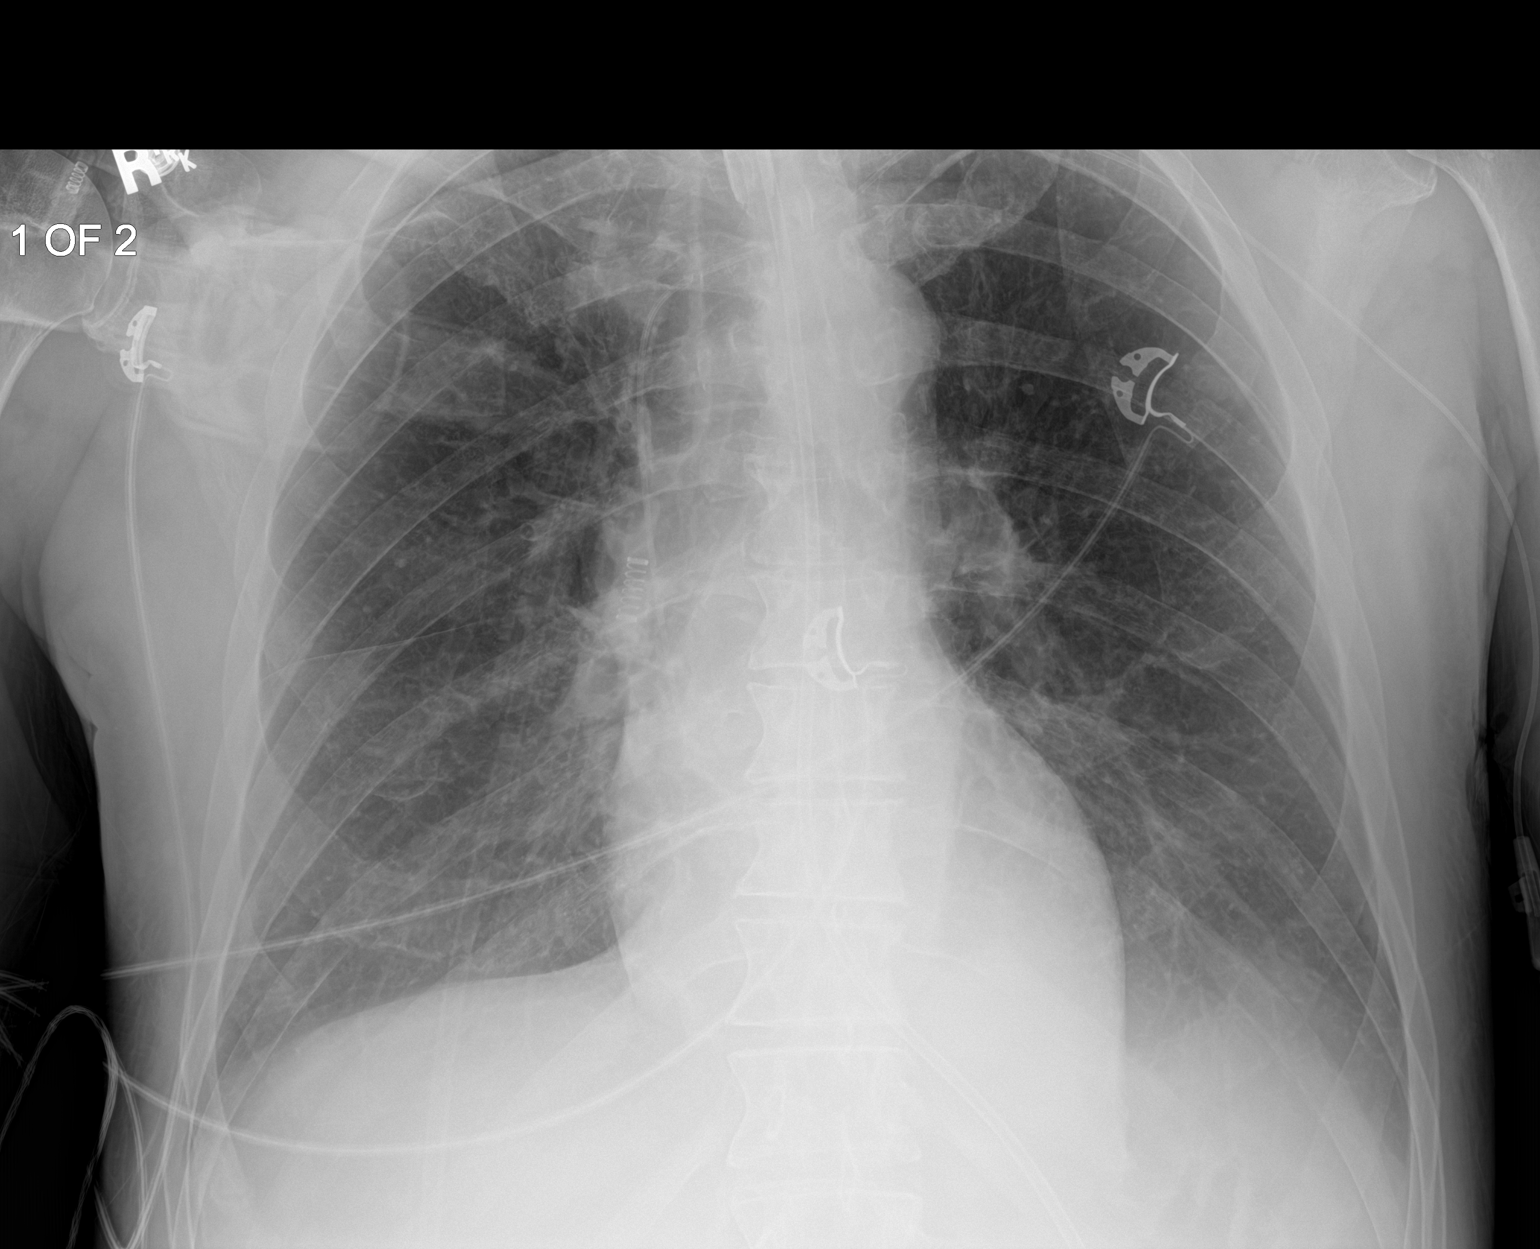
[im 2/2]
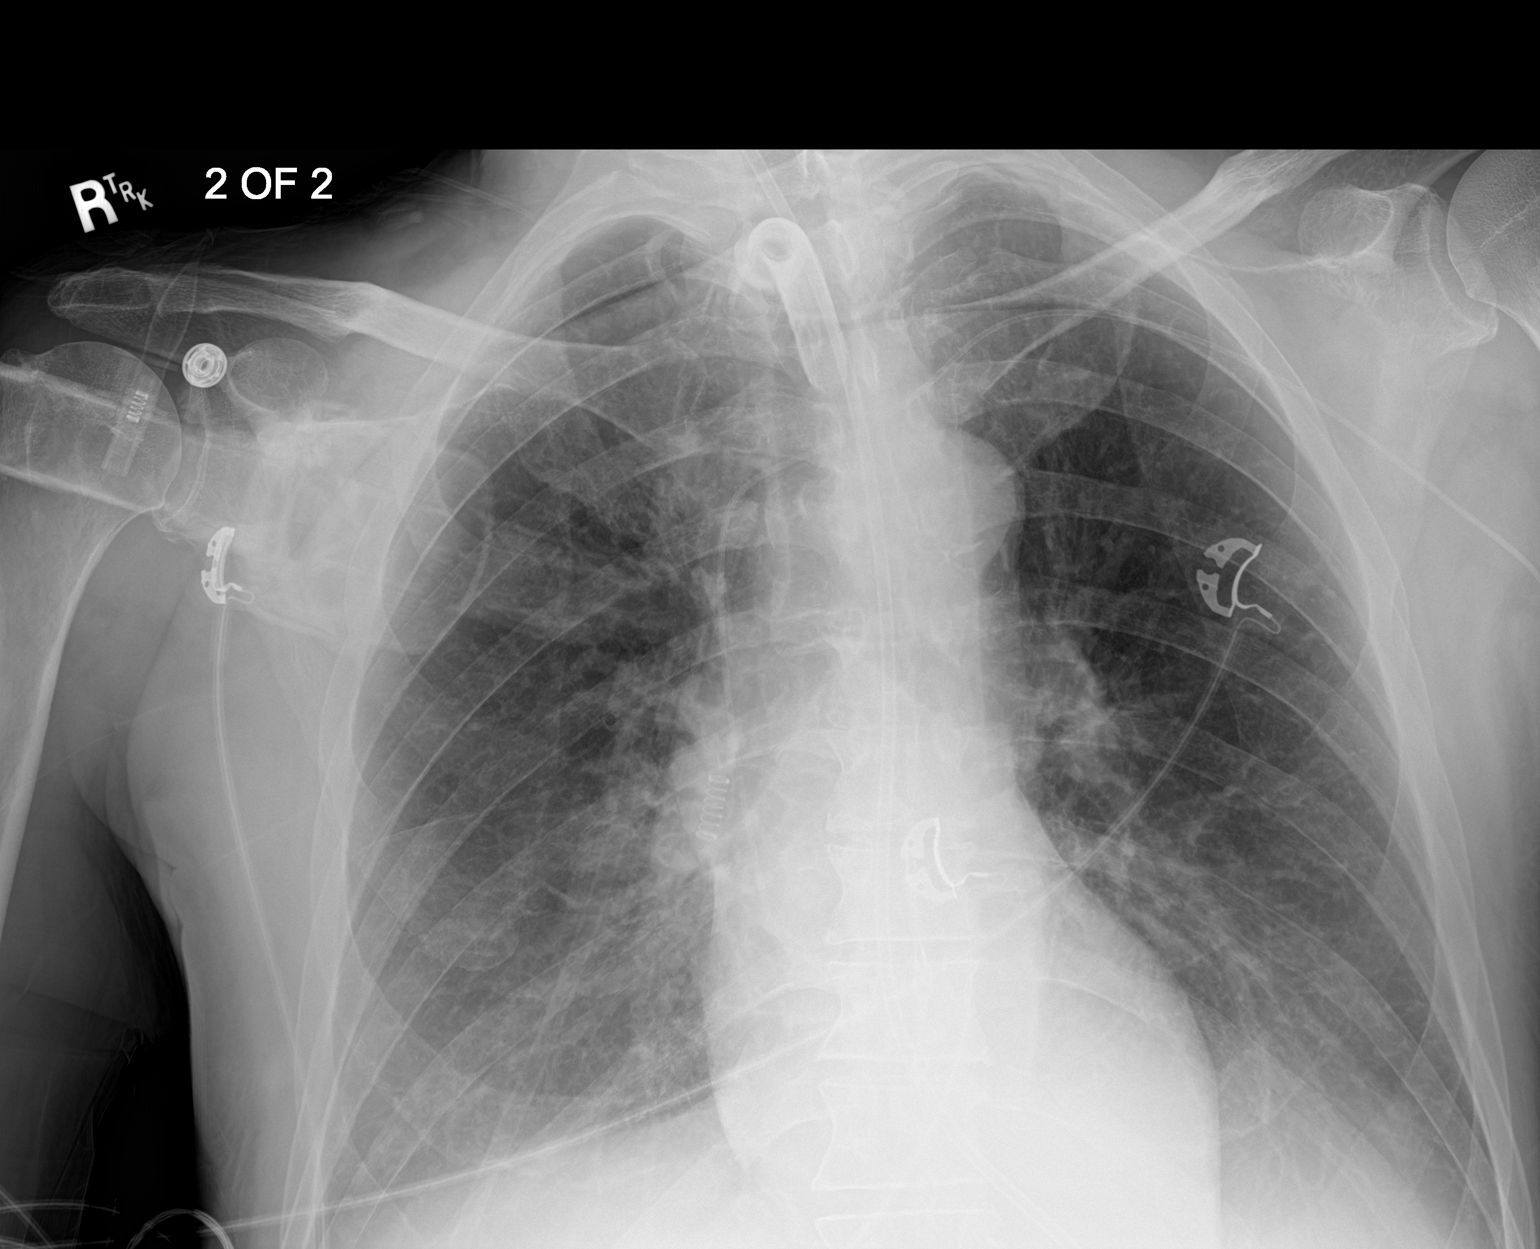

[2 of 2 positions shown; findings below may reference images not displayed]

FINDINGS: Portable AP semi upright view at [3I] hours. Endotracheal tube
exchanged for tracheostomy tube which projects over the tracheal air
column. No adverse features. Enteric feeding remains in place,
courses to the abdomen with tip not included. Left PICC line also
now in place. Tip projects at the cavoatrial junction level.

Mediastinal contours remain normal. Stable lung volumes. Allowing
for portable technique the lungs are clear. No acute osseous
abnormality identified.
IMPRESSION: 1. Tracheostomy tube placed with no adverse features.
2. Left PICC line placed, tip at the cavoatrial junction. Stable
visible enteric feeding tube.
3. No acute cardiopulmonary abnormality.

## 2019-10-22 MED ORDER — PANTOPRAZOLE SODIUM 40 MG PO PACK
40.0000 mg | PACK | Freq: Every day | ORAL | Status: DC
  Administered 2019-10-22: 40 mg
  Filled 2019-10-22: qty 20

## 2019-10-22 MED ORDER — BETHANECHOL CHLORIDE 10 MG PO TABS
15.0000 mg | ORAL_TABLET | Freq: Three times a day (TID) | ORAL | Status: DC
  Administered 2019-10-22 – 2019-11-21 (×76): 15 mg
  Filled 2019-10-22 (×77): qty 2

## 2019-10-22 NOTE — Procedures (Signed)
Procedure: Percutaneous Tracheostomy CPT 31600 Performed by: Dr. Eliezer Bottom under supervision of Anders Simmonds, NP and Dr. Myrla Halsted, MD Bronchoscopy Assistant: Dr. Melody Comas  Indications: Chronic respiratory failure and need for ongoing mechanical ventilation.  Consent: Signed in chart  Preprocedure: Universal protocol was followed for this procedure. Timeout performed. Anterior neck prepped and draped.  Anesthesia: The patient was intubated and sedated prior to the procedure. Additional midazolam, etomidate, fentanyl and vecuronium were given for sedation and paralysis with close attention to vital signs throughout procedure.   Procedure: The patient was placed in the supine position. The anterior neck was prepped and draped in usual sterile fashion. 1% lidocaine was administered approximately 2 fingerbreadths above the sternal notch for local anesthesia. A 1.5-cm vertical incision was then performed 2 fingerbreadths above the sternal notch. Using a curved Kelly, blunt dissection was performed down to the level of the pretracheal fascia. At this point, the bronchoscope was introduced through the endotracheal tube and the trachea was properly visualized. The endotracheal tube was then gradually withdrawn within the trachea under direct bronchoscopic visualization. Proper midline position was confirmed by bouncing the needle from the tracheostomy tray over the trachea with bronchoscopic examination. The needle was advanced into the trachea and proper positioning was confirmed with direct visualization. The needle was then removed leaving a white outer cannula in position. The wire from the tracheostomy tray was then advanced through the white outer cannula. The cannula was then removed. The small, blue dilator was then advanced over the wire into the trachea. Once proper dilatation was achieved, the dilator was removed. The large, tapered dilator was then advanced over the wire into the trachea.  The dilator was removed leaving the wire and white inner cannula in position. A number 6.5 percutaneous Shiley tracheostomy tube was then advanced over the wire and white inner cannula into the trachea. Proper positioning was confirmed with bronchoscopic visualization. The tracheostomy tube was then sutured in place with four nylon sutures. It was further secured with a tracheostomy tie.  Estimated blood loss: Less than 5 mL.  Complications: None immediate.  CXR ordered.   Performed under supervision of Anders Simmonds, NP and Dr. Myrla Halsted, MD.   Eliezer Bottom, MD Internal Medicine, PGY-2 10/22/19 2:32 PM Pager # [phone]

## 2019-10-22 NOTE — Progress Notes (Signed)
Physical Therapy Treatment Patient Details Name: Corey Bennett MRN: [id number] DOB: Dec 09, 1960 Today's Date: 10/22/2019    History of Present Illness 59 y.o. 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  He was intubated for airway protection on 10/10/19. Pt extubated on 10/14/19.  on 8/14, pt had continued twitching of Rt UE with AMS, and decreased 02 sats.  He was intubated.  Diagnosis: status epilepticus in setting of prior large Lt MCA infarct; acute kidney injury, possible acute meningitis, HTN. PMH includes:  poorly controlled DM, dysphagia, HTN     PT Comments    Patient progressing this session with +2 A to EOB activity and able to sit briefly with S with L UE support.  He continues to be weaker on L and c/o pain L UE with mobility.  He has some L inattention as well needing cues to look at L UE when attempting mobility.  He will continue to benefit from skilled PT in the acute setting and follow up SNF at d/c.    Follow Up Recommendations  SNF;Supervision/Assistance - 24 hour     Equipment Recommendations  Wheelchair (measurements PT);Wheelchair cushion (measurements PT)    Recommendations for Other Services       Precautions / Restrictions Precautions Precautions: Fall Precaution Comments: intubated    Mobility  Bed Mobility Overal bed mobility: Needs Assistance Bed Mobility: Supine to Sit;Sit to Supine     Supine to sit: Mod assist;+2 for physical assistance;+2 for safety/equipment Sit to supine: +2 for physical assistance;Mod assist   General bed mobility comments: had R leg off bed to begin with so assisted L leg off bed and +2 A to lift trunk; to supine assist for R leg onto bed and to lower trunk  Transfers                 General transfer comment: NT  Ambulation/Gait                 Stairs             Wheelchair Mobility    Modified  Rankin (Stroke Patients Only)       Balance Overall balance assessment: Needs assistance Sitting-balance support: Feet supported Sitting balance-Leahy Scale: Poor Sitting balance - Comments: sitting with mod to min A most of session at EOB, could sit momentarily with S with L UE support; sat up in reclined chair position in bed end of session needing support under L shoulder to prevent L lateral lean and pt able to keep head up relatively well                                    Cognition Arousal/Alertness: Awake/alert Behavior During Therapy: Flat affect Overall Cognitive Status: Difficult to assess                                        Exercises General Exercises - Upper Extremity Shoulder Flexion: PROM;Left;10 reps;Seated Shoulder Extension: PROM;Left;10 reps;Seated General Exercises - Lower Extremity Hip Flexion/Marching: AAROM;PROM;Left;Right;Both;5 reps;Seated    General Comments        Pertinent Vitals/Pain Pain Assessment: Faces Faces Pain Scale: Hurts little more Pain Location: R UE with ROM Pain Descriptors / Indicators: Grimacing;Guarding  Pain Intervention(s): Monitored during session;Repositioned    Home Living                      Prior Function            PT Goals (current goals can now be found in the care plan section) Progress towards PT goals: Progressing toward goals    Frequency    Min 3X/week      PT Plan Current plan remains appropriate    Co-evaluation              AM-PAC PT "6 Clicks" Mobility   Outcome Measure  Help needed turning from your back to your side while in a flat bed without using bedrails?: Total Help needed moving from lying on your back to sitting on the side of a flat bed without using bedrails?: Total Help needed moving to and from a bed to a chair (including a wheelchair)?: Total Help needed standing up from a chair using your arms (e.g., wheelchair or bedside  chair)?: Total Help needed to walk in hospital room?: Total Help needed climbing 3-5 steps with a railing? : Total 6 Click Score: 6    End of Session   Activity Tolerance: Patient limited by fatigue Patient left: in bed;with call bell/phone within reach;with restraints reapplied   PT Visit Diagnosis: Other abnormalities of gait and mobility (R26.89);Other symptoms and signs involving the nervous system (R29.898);Muscle weakness (generalized) (M62.81)     Time: 2353-6144 PT Time Calculation (min) (ACUTE ONLY): 19 min  Charges:  $Therapeutic Activity: 8-22 mins                     Sheran Lawless, PT Acute Rehabilitation Services Pager:[phone] Office:[phone] 10/22/2019    Corey Bennett 10/22/2019, 1:44 PM

## 2019-10-22 NOTE — Procedures (Signed)
Bedside Tracheostomy Insertion Procedure Note   Patient Details:   Name: Corey Bennett DOB: 16-Mar-1960 MRN: [id number]  Procedure: Tracheostomy  Pre Procedure Assessment: ET Tube Size: 7.5 ET Tube secured at lip (cm): 27 Bite block in place: No Breath Sounds: Rhonch  Post Procedure Assessment: BP 138/60   Pulse (!) 52   Temp 97.6 F (36.4 C) (Axillary)   Resp 16   Ht 6\' 1"  (1.854 m)   Wt 78.9 kg   SpO2 100%   BMI 22.95 kg/m  O2 sats: stable throughout Complications: No apparent complications Patient did tolerate procedure well Tracheostomy Brand:Shiley Tracheostomy Style:Cuffed Tracheostomy Size: 6 Tracheostomy Secured , velcro Tracheostomy Placement Confirmation:Trach cuff visualized and in place and Chest X ray ordered for placement    KMQ:[id number] 10/22/2019, 2:39 PM

## 2019-10-22 NOTE — Progress Notes (Signed)
NAME:  Corey Bennett, MRN:  [id number], DOB:  [birth year], LOS: 11 ADMISSION DATE:  10/10/2019, CONSULTATION DATE:  10/17/2019 REFERRING MD:  Ella Jubilee, CHIEF COMPLAINT:  Seizure , possible aspiration   Brief History   Corey Bennett monitor is a 59 year old male with past medical history of 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 Patient is living in a assisted living facility and can ambulate by himself. Patient was found by staff to have a right sided twitching with right eye deviation. He was given Versed and Dilantin and was intubated for airway protection.  Patient was extubated on 8/11 and his seizures was controlled with Keppra, phenytoin, and Vimpat.  He was transferred to [address] for medical management  On 8/14, patient continued to have seizure with twitching of the right upper extremity.  2 mg of lorazepam was given.  Patient continued to desat in the 80s on 15 L of nonrebreather mask with altered mental status.  Patient was intubated to protect airway and transferred to the ICU.  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 Large area of encephalomalacia throughout the left MCA territory, consistent with remote infarct. 8/8 CXR>>lungs clear 8/8 UDS>>+ benzo (but given versed in ED); o/w negative 8/8 MRI of brain>>large territory chronic hemorrhagic infarct in the left frontal lobe.Ill-defined hyperintensity throughout the left basal ganglia and left thalamus with mild enhancement in dobutamine likely secondary to encephalitis, infiltrating tumor or atypical subacute  infarction 8/8 MRI of face and trigeminal region>>rules out soft tissue abscess 08/17 CXR>>improved aeration to the right lung base  Micro Data:  8/8 Sars-CoV-2>>negative 8/8 BCx2>> no growth 8/8 UC>> Klebsiella pneumonia 08/14 BCx>> negative 08/14 Trach Cx>> gram positive rods and cocci in pairs thus far   Antimicrobials:  Vancomycin 8/8>> 8/12 Ceftriaxone 8/8>> 8/12 Ampicillin 8/8>>8/11 Zosyn 8/14 >> Vancomycin 08/14>>08/16 Interim history/subjective:  On vent, comfortable, brother at bedside. Patient opens eyes to verbal stimuli. Able to answer questions via nodding. Denies that he is in any pain. Updated on plan for tracheostomy today.  Objective   Blood pressure (!) 116/58, pulse (!) 53, temperature 98.6 F (37 C), temperature source Oral, resp. rate 16, height 6\' 1"  (1.854 m), weight 78.9 kg, SpO2 100 %.    Vent Mode: PRVC FiO2 (%):  [30 %] 30 % Set Rate:  [16 bmp] 16 bmp Vt Set:  [630 mL] 630 mL PEEP:  [5 cmH20] 5 cmH20 Plateau Pressure:  [13 cmH20-21 cmH20] 13 cmH20   Intake/Output Summary (Last 24 hours) at 10/22/2019 0826 Last data filed at 10/22/2019 0554 Gross per 24 hour  Intake 753.22 ml  Output 2070 ml  Net -1316.78 ml   Filed Weights   10/20/19 0500 10/21/19 0500 10/22/19 0346  Weight: 75.2 kg 79.4 kg 78.9 kg    Examination: General: Breathing comfortably on vent, no acute distress HENT: [place]/AT; PRL; ETT Lungs: Clear bilaterally, anterior and laterally Cardiovascular: RRR, no m/r/g Abdomen: Supple, w/o guarding, +BS Extremities: Mild non-pitting edema of lower extremities bilaterally  Neuro: On precedex, opens eyes to verbal stimuli, follows/tracks. Follows commands, nods to answer questions appropriately. Pupils equal round and reactive. Moves left upper and lower extremity. Spontaneously moving right lower  extremity. No seizure like activity noticed during exam.   Assessment & Plan:  1) Acute respiratory distress requiring mechanical ventilation  w/aspiration pneumonia  -Continue ventilator. Tracheostomy today. PEG scheduled for Monday 08/23. Plavis held beginning 08/17. Holding aspirin.  -Continue precedex  -Oral suction with tube care -Daily SBT and SAT -Trach cultures positive for gram pos rods and gram pos cocci -repeat CXR yesterday shows much improved infiltrate on right side  -continue Zosyn, today day 5/7 2) Status epilepticus in the setting of recent large left MCA stroke -No right arm twitching noticed since transfer to ICU.  -Appreciate neurology recommendation -anti-seizure meds dosing per Neurol/Pharm 3) AKI/kypokalemia -Cr improved 1.67<1.74<1.85 -Continue LR 25ml/hr today -Will replete K+ as needed -Trend Cr with daily BMP's 4) Hypertension -BP within normal limits -holding antihypertensives, consider adding amlodipine or losartan if becomes hypertensive  5) Urinary Retention,  -foley d/c yesterday, patient required cath x3 -increase bethanecol to 15mg  daily  -will re-insert foley today  6) DM -BG improved in 160's  -continue Lantus 25 units dails -continue mealtime insulin at 3 units q4hrs +SSI -continue to monitor   Best practice:  Diet: TF Pain/Anxiety/Delirium protocol (if indicated): Precedex VAP protocol (if indicated): Yes DVT prophylaxis: enoxaparin GI prophylaxis: PPI Glucose control: SSI/ Lantus Mobility: BR Code Status: Full Family Communication: Brother updated at bedside  Disposition: ICU  Labs   CBC: Recent Labs  Lab 10/16/19 0154 10/17/19 1231 10/18/19 0554  WBC 12.0*  --  11.8*  NEUTROABS  --   --  9.9*  HGB 8.5* 8.5* 7.4*  HCT 25.2* 25.0* 22.3*  MCV 85.7  --  87.5  PLT 328  --  PLATELET CLUMPS NOTED ON SMEAR, UNABLE TO ESTIMATE    Basic Metabolic Panel: Recent Labs  Lab 10/17/19 0303 10/17/19 0303 10/17/19 1231 10/18/19 0554 10/18/19 1453 10/20/19 1142 10/21/19 0835  NA 138   < > 140 138 139 140 142  K 3.8   < > 3.8 3.2* 3.3* 4.3 3.8  CL 103  --   --  105 107  109 111  CO2 21*  --   --  21* 21* 18* 21*  GLUCOSE 216*  --   --  292* 296* 333* 175*  BUN 19  --   --  29* 29* 26* 24*  CREATININE 1.56*  --   --  2.12* 1.85* 1.74* 1.67*  CALCIUM 8.4*  --   --  8.2* 8.1* 7.9* 8.0*   < > = values in this interval not displayed.   GFR: Estimated Creatinine Clearance: 53.8 mL/min (A) (by C-G formula based on SCr of 1.67 mg/dL (H)). Recent Labs  Lab 10/16/19 0154 10/18/19 0554  WBC 12.0* 11.8*    Liver Function Tests: Recent Labs  Lab 10/16/19 0154 10/21/19 0835  AST  --  41  ALT  --  36  ALKPHOS  --  123  BILITOT  --  0.4  PROT  --  5.7*  ALBUMIN 2.5* 1.8*   No results for input(s): LIPASE, AMYLASE in the last 168 hours. No results for input(s): AMMONIA in the last 168 hours.  ABG    Component Value Date/Time   PHART 7.339 (L) 10/17/2019 1231   PCO2ART 36.7 10/17/2019 1231   PO2ART 117 (H) 10/17/2019 1231   HCO3 19.6 (L) 10/17/2019 1231   TCO2 21 (L) 10/17/2019 1231   ACIDBASEDEF 5.0 (H) 10/17/2019 1231   O2SAT 98.0 10/17/2019 1231     Coagulation Profile: No results for input(s): INR,  PROTIME in the last 168 hours.  Cardiac Enzymes: No results for input(s): CKTOTAL, CKMB, CKMBINDEX, TROPONINI in the last 168 hours.  HbA1C: Hgb A1c MFr Bld  Date/Time Value Ref Range Status  09/04/2019 10:00 AM 7.2 (H) 4.8 - 5.6 % Final    Comment:    (NOTE) Pre diabetes:          5.7%-6.4%  Diabetes:              >6.4%  Glycemic control for   <7.0% adults with diabetes     CBG: Recent Labs  Lab 10/21/19 1546 10/21/19 1925 10/21/19 2330 10/22/19 0313 10/22/19 0719  GLUCAP 148* 146* 167* 167* 107*    Past Medical History  He,  has a past medical history of Carotid artery occlusion, Diabetes (HCC), Hypercholesterolemia, Hypertension, Paralysis (HCC), and Stroke (HCC).   Surgical History    Past Surgical History:  Procedure Laterality Date  . ENDARTERECTOMY Left 09/08/2019   Procedure: LEFT CAROTID ENDARTERECTOMY;   Surgeon: Chuck Hint, MD;  Location: [facility] OR;  Service: Vascular;  Laterality: Left;  . PATCH ANGIOPLASTY Left 09/08/2019   Procedure: PATCH ANGIOPLASTY USING XENOSURE BIOLOGIC PATCH 1x6cm;  Surgeon: Chuck Hint, MD;  Location: [facility] OR;  Service: Vascular;  Laterality: Left;  . TONSILLECTOMY       Social History   reports that he has been smoking cigarettes. He has been smoking about 1.00 pack per day. He has never used smokeless tobacco. He reports current alcohol use. He reports that he does not use drugs.   Family History   His family history is not on file.   Allergies No Known Allergies   Home Medications  Prior to Admission medications   Medication Sig Start Date End Date Taking? Authorizing Provider  amLODipine (NORVASC) 10 MG tablet Take 10 mg by mouth daily.    Yes [provider]  aspirin 81 MG chewable tablet Chew 81 mg by mouth daily.   Yes [provider]  atorvastatin (LIPITOR) 80 MG tablet Take 80 mg by mouth daily.    Yes [provider]  chlorhexidine (PERIDEX) 0.12 % solution Use as directed 15 mLs in the mouth or throat 2 (two) times daily.   Yes [provider]  Cholecalciferol (VITAMIN D-3) 125 MCG (5000 UT) TABS Take 5,000 Units by mouth daily.    Yes [provider]  clopidogrel (PLAVIX) 75 MG tablet Take 1 tablet (75 mg total) by mouth daily. 09/09/19  Yes Emilie Rutter, PA-C  HYDROcodone-acetaminophen (NORCO/VICODIN) 5-325 MG tablet Take 1 tablet by mouth every 6 (six) hours as needed for moderate pain.   Yes [provider]  insulin glargine (LANTUS) 100 UNIT/ML injection Inject 12 Units into the skin at bedtime.   Yes [provider]  insulin lispro (HUMALOG) 100 UNIT/ML injection Inject 5 Units into the skin 3 (three) times daily before meals. *HOLD INSULIN IF BLOOD GLUCOSE IS LESS THAN 120.*   Yes [provider]  losartan (COZAAR) 25 MG tablet Take 25 mg by mouth daily.     Yes [provider]  metFORMIN (GLUCOPHAGE) 1000 MG tablet Take 1,000 mg by mouth 2 (two) times daily with a meal.    Yes [provider]  penicillin v potassium (VEETID) 500 MG tablet Take 500 mg by mouth 4 (four) times daily.   Yes [provider]  senna (SENOKOT) 8.6 MG TABS tablet Take 1 tablet by mouth daily.   Yes [provider]  Critical care time: 35 min

## 2019-10-22 NOTE — Progress Notes (Signed)
SLP Cancellation Note  Patient Details Name: Corey Bennett MRN: [id number] DOB: 1960-08-13   Cancelled treatment:         Patient with new tracheostomy. Orders for SLP eval and treat for PMSV and swallowing received. Will follow pt closely for readiness for SLP interventions as appropriate.     [name]., M.A. CCC-SLP Acute Rehabilitation Services Pager [phone] Office [phone]  10/22/2019, 2:33 PM

## 2019-10-23 LAB — GLUCOSE, CAPILLARY
Glucose-Capillary: 137 mg/dL — ABNORMAL HIGH (ref 70–99)
Glucose-Capillary: 140 mg/dL — ABNORMAL HIGH (ref 70–99)
Glucose-Capillary: 143 mg/dL — ABNORMAL HIGH (ref 70–99)
Glucose-Capillary: 167 mg/dL — ABNORMAL HIGH (ref 70–99)
Glucose-Capillary: 228 mg/dL — ABNORMAL HIGH (ref 70–99)

## 2019-10-23 LAB — CULTURE, BLOOD (ROUTINE X 2)
Culture: NO GROWTH
Culture: NO GROWTH
Special Requests: ADEQUATE
Special Requests: ADEQUATE

## 2019-10-23 LAB — BASIC METABOLIC PANEL
Anion gap: 8 (ref 5–15)
BUN: 24 mg/dL — ABNORMAL HIGH (ref 6–20)
CO2: 22 mmol/L (ref 22–32)
Calcium: 7.8 mg/dL — ABNORMAL LOW (ref 8.9–10.3)
Chloride: 109 mmol/L (ref 98–111)
Creatinine, Ser: 1.52 mg/dL — ABNORMAL HIGH (ref 0.61–1.24)
GFR calc Af Amer: 57 mL/min — ABNORMAL LOW (ref >60)
GFR calc non Af Amer: 49 mL/min — ABNORMAL LOW (ref >60)
Glucose, Bld: 123 mg/dL — ABNORMAL HIGH (ref 70–99)
Potassium: 3.4 mmol/L — ABNORMAL LOW (ref 3.5–5.1)
Sodium: 139 mmol/L (ref 135–145)

## 2019-10-23 LAB — CULTURE, RESPIRATORY W GRAM STAIN: Culture: NO GROWTH

## 2019-10-23 MED ORDER — PANTOPRAZOLE SODIUM 40 MG IV SOLR
40.0000 mg | INTRAVENOUS | Status: DC
  Administered 2019-10-23 – 2019-10-26 (×4): 40 mg via INTRAVENOUS
  Filled 2019-10-23 (×4): qty 40

## 2019-10-23 NOTE — Progress Notes (Signed)
eLink Physician-Brief Progress Note Patient Name: Corey Bennett DOB: May 04, 1960 MRN: [id number]   Date of Service  10/23/2019  HPI/Events of Note  Patient NPO for PEG on 8/23. Getting Protonix per tube. Request to change to IV.   eICU Interventions  Will change Protonix from per tube to IV.      Intervention Category Major Interventions: Other:  Lenell Antu 10/23/2019, 10:48 PM

## 2019-10-23 NOTE — Progress Notes (Signed)
Pt removed cortrak, E-link notified.

## 2019-10-23 NOTE — Progress Notes (Addendum)
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 Patient is living in a assisted living facility and can ambulate by himself. Patient was found by staff to have a right sided twitching with right eye deviation. He was given Versed and Dilantin and was intubated for airway protection.  Patient was extubated on 8/11 and his seizures was controlled with Keppra, phenytoin, and Vimpat.  He was transferred to [address] for medical management  On 8/14, patient continued to have seizure with twitching of the right upper extremity.  2 mg of lorazepam was given.  Patient continued to desat in the 80s on 15 L of nonrebreather mask with altered mental status.  Patient was intubated to protect airway and transferred to the ICU.  Past Medical History  Left MCA stroke with residual aphasia and right hemiparesis CEA Hypertension Hypercholesterolemia Diabetes   Significant Hospital Events   8/8intubated in ED 8/11 extubation 8/14 reintubation  Consults:  Neurology  Procedures:  8/8 ETT 8/14 ETT 08/19 tracheostomy   Significant Diagnostic Tests:  8/8 CT head>>No acute intracranial abnormality. Large area of encephalomalacia throughout the left MCA territory, consistent with remote infarct. 8/8 CXR>>lungs clear 8/8 UDS>>+ benzo (but given versed in ED); o/w negative 8/8 MRI of brain>>large territory chronic hemorrhagic infarct in the left frontal lobe.Ill-defined hyperintensity throughout the left basal ganglia and left thalamus with mild enhancement in dobutamine likely secondary to encephalitis, infiltrating tumor or  atypical subacute infarction 8/8 MRI of face and trigeminal region>>rules out soft tissue abscess 08/17 CXR>>improved aeration to the right lung base  Micro Data:  8/8 Sars-CoV-2>>negative 8/8 BCx2>> no growth 8/8 UC>> Klebsiella pneumonia 08/14 BCx>> negative 08/14 Trach Cx>> gram positive rods and cocci in pairs 08/17 Trach Cx>> negative   Antimicrobials:  Vancomycin 8/8>> 8/12 Ceftriaxone 8/8>> 8/12 Ampicillin 8/8>>8/11 Zosyn 8/14 >>08/21 Vancomycin 08/14>>08/16 Interim history/subjective:  Patient awake and alert. No family at bedside. Nods appropriately to answer questions. Denies any pain.   Objective   Blood pressure (!) 115/52, pulse (!) 55, temperature (!) 97.1 F (36.2 C), temperature source Axillary, resp. rate 16, height 6\' 1"  (1.854 m), weight 81.9 kg, SpO2 100 %.    Vent Mode: PRVC FiO2 (%):  [30 %] 30 % Set Rate:  [16 bmp] 16 bmp Vt Set:  [630 mL] 630 mL PEEP:  [5 cmH20] 5 cmH20 Plateau Pressure:  [13 cmH20-14 cmH20] 14 cmH20   Intake/Output Summary (Last 24 hours) at 10/23/2019 0801 Last data filed at 10/23/2019 0700 Gross per 24 hour  Intake 2405.73 ml  Output 1295 ml  Net 1110.73 ml   Filed Weights   10/21/19 0500 10/22/19 0346 10/23/19 0500  Weight: 79.4 kg 78.9 kg 81.9 kg    Examination: General: Breathing comfortably on vent, trach in place. no acute distress HENT: [place]/AT; PRL; trach, clean, dry, no active bleeding Lungs: Clear bilaterally, anterior and laterally Cardiovascular: RRR, no m/r/g Abdomen: Supple, w/o guarding, +BS Extremities: Mild non-pitting edema of lower extremities bilaterally, mild edema of right upper ext, stable from previous exam Neuro: On precedex this AM, awake, alert, nods to answer questions appropriately. Follows commands. Moves left upper and lower extremity. Does not  move right upper or lower extremity.    Assessment & Plan:  1) Acute respiratory distress requiring mechanical ventilation w/aspiration pneumonia    -Continue ventilator. S/p succesful trach yesterday. PEG scheduled for Monday 08/23. Plavis held beginning 08/17. Holding aspirin.  -d/c precedex today -Oral suction with tube care -Daily SBT and SAT -Trach cultures positive for gram pos rods and gram pos cocci -repeat CXR yesterday shows much improved infiltrate on right side 08/17 -continue Zosyn, today day 5/7-d/c tomorrow 2) Status epilepticus in the setting of recent large left MCA stroke -No right arm twitching or other sz activity noticed since transfer to ICU.  -Appreciate neurology recommendation -anti-seizure meds dosing per Neurol/Pharm 3) AKI/kypokalemia -Cr improving 1.52<1.67<1.74<1.85 -D/C LR 71ml/hr today -Will replete K+ as needed -Trend Cr with daily BMP's 4) Hypertension -BP within normal limits -holding antihypertensives, consider adding amlodipine or losartan if becomes hypertensive  5) Urinary Retention -re-insert foley yesterday after failed voiding trial -continue bethanecol 15mg  today  -voiding trial tomorrow 6) DM -BG in 130's mostly over last 24 hrs, will continue to monitor  -continue Lantus 25 units dails -continue mealtime insulin at 3 units q4hrs +SSI  Best practice:  Diet: TF Pain/Anxiety/Delirium protocol (if indicated): Precedex VAP protocol (if indicated): Yes DVT prophylaxis: enoxaparin GI prophylaxis: PPI Glucose control: SSI/ Lantus Mobility: BR Code Status: Full Family Communication: Brother updated at bedside  Disposition: ICU  Labs   CBC: Recent Labs  Lab 10/17/19 1231 10/18/19 0554 10/22/19 0909  WBC  --  11.8* 7.0  NEUTROABS  --  9.9*  --   HGB 8.5* 7.4* 7.1*  HCT 25.0* 22.3* 22.7*  MCV  --  87.5 91.5  PLT  --  PLATELET CLUMPS NOTED ON SMEAR, UNABLE TO ESTIMATE 401*    Basic Metabolic Panel: Recent Labs  Lab 10/18/19 1453 10/20/19 1142 10/21/19 0835 10/22/19 0909 10/23/19 0524  NA 139 140 142 141 139  K 3.3* 4.3 3.8 3.4* 3.4*  CL 107 109 111 110 109  CO2  21* 18* 21* 23 22  GLUCOSE 296* 333* 175* 129* 123*  BUN 29* 26* 24* 24* 24*  CREATININE 1.85* 1.74* 1.67* 1.53* 1.52*  CALCIUM 8.1* 7.9* 8.0* 8.0* 7.8*   GFR: Estimated Creatinine Clearance: 59.1 mL/min (A) (by C-G formula based on SCr of 1.52 mg/dL (H)). Recent Labs  Lab 10/18/19 0554 10/22/19 0909  WBC 11.8* 7.0    Liver Function Tests: Recent Labs  Lab 10/21/19 0835  AST 41  ALT 36  ALKPHOS 123  BILITOT 0.4  PROT 5.7*  ALBUMIN 1.8*   No results for input(s): LIPASE, AMYLASE in the last 168 hours. No results for input(s): AMMONIA in the last 168 hours.  ABG    Component Value Date/Time   PHART 7.339 (L) 10/17/2019 1231   PCO2ART 36.7 10/17/2019 1231   PO2ART 117 (H) 10/17/2019 1231   HCO3 19.6 (L) 10/17/2019 1231   TCO2 21 (L) 10/17/2019 1231   ACIDBASEDEF 5.0 (H) 10/17/2019 1231   O2SAT 98.0 10/17/2019 1231     Coagulation Profile: Recent Labs  Lab 10/22/19 0909  INR 1.2    Cardiac Enzymes: No results for input(s): CKTOTAL, CKMB, CKMBINDEX, TROPONINI in the last 168 hours.  HbA1C: Hgb A1c MFr Bld  Date/Time Value Ref Range Status  09/04/2019 10:00 AM 7.2 (H) 4.8 - 5.6 % Final    Comment:    (NOTE) Pre diabetes:          5.7%-6.4%  Diabetes:              >  6.4%  Glycemic control for   <7.0% adults with diabetes     CBG: Recent Labs  Lab 10/22/19 1534 10/22/19 1951 10/22/19 2353 10/23/19 0308 10/23/19 0737  GLUCAP 123* 98 154* 140* 137*    Past Medical History  He,  has a past medical history of Carotid artery occlusion, Diabetes (HCC), Hypercholesterolemia, Hypertension, Paralysis (HCC), and Stroke (HCC).   Surgical History    Past Surgical History:  Procedure Laterality Date  . ENDARTERECTOMY Left 09/08/2019   Procedure: LEFT CAROTID ENDARTERECTOMY;  Surgeon: Chuck Hint, MD;  Location: [facility] OR;  Service: Vascular;  Laterality: Left;  . PATCH ANGIOPLASTY Left 09/08/2019   Procedure: PATCH ANGIOPLASTY USING XENOSURE  BIOLOGIC PATCH 1x6cm;  Surgeon: Chuck Hint, MD;  Location: [facility] OR;  Service: Vascular;  Laterality: Left;  . TONSILLECTOMY       Social History   reports that he has been smoking cigarettes. He has been smoking about 1.00 pack per day. He has never used smokeless tobacco. He reports current alcohol use. He reports that he does not use drugs.   Family History   His family history is not on file.   Allergies No Known Allergies   Home Medications  Prior to Admission medications   Medication Sig Start Date End Date Taking? Authorizing Provider  amLODipine (NORVASC) 10 MG tablet Take 10 mg by mouth daily.    Yes [provider]  aspirin 81 MG chewable tablet Chew 81 mg by mouth daily.   Yes [provider]  atorvastatin (LIPITOR) 80 MG tablet Take 80 mg by mouth daily.    Yes [provider]  chlorhexidine (PERIDEX) 0.12 % solution Use as directed 15 mLs in the mouth or throat 2 (two) times daily.   Yes [provider]  Cholecalciferol (VITAMIN D-3) 125 MCG (5000 UT) TABS Take 5,000 Units by mouth daily.    Yes [provider]  clopidogrel (PLAVIX) 75 MG tablet Take 1 tablet (75 mg total) by mouth daily. 09/09/19  Yes Emilie Rutter, PA-C  HYDROcodone-acetaminophen (NORCO/VICODIN) 5-325 MG tablet Take 1 tablet by mouth every 6 (six) hours as needed for moderate pain.   Yes [provider]  insulin glargine (LANTUS) 100 UNIT/ML injection Inject 12 Units into the skin at bedtime.   Yes [provider]  insulin lispro (HUMALOG) 100 UNIT/ML injection Inject 5 Units into the skin 3 (three) times daily before meals. *HOLD INSULIN IF BLOOD GLUCOSE IS LESS THAN 120.*   Yes [provider]  losartan (COZAAR) 25 MG tablet Take 25 mg by mouth daily.    Yes [provider]  metFORMIN (GLUCOPHAGE) 1000 MG tablet Take 1,000 mg by mouth 2 (two) times daily with a meal.    Yes [provider]  penicillin v  potassium (VEETID) 500 MG tablet Take 500 mg by mouth 4 (four) times daily.   Yes [provider]  senna (SENOKOT) 8.6 MG TABS tablet Take 1 tablet by mouth daily.   Yes [provider]     Pulmonary critical care attending:  Patient seen in conjunction with medical student, MS 4.  I agree with documentation above.  This is a 59 year old gentleman with a past medical history of left MCA stroke with residual aphasia and right-sided hemiparesis, baseline hypertension hyperlipidemia and diabetes.  He was admitted after having seizure-like activity.  Found to have right-sided twitching and right eye deviation.  Patient was diagnosed with status epilepticus requiring intubation and mechanical ventilation.  Patient  currently doing well out of status.  Patient does have chronic hypoxemic respiratory failure requiring tracheostomy tube placement which occurred yesterday.  He is doing well post tracheostomy.  Still on mechanical support.  Attempted pressure support trials this morning however failed with very little effort.  Slowly coming off sedation.  Only continuous at this time is Precedex.  Discussed with nursing staff importance of coming off of this today.  Patient states he is feeling okay able to answer questions by nodding yes/ no.  BP (!) 122/53   Pulse (!) 52   Temp 98.9 F (37.2 C) (Axillary)   Resp 16   Ht 6\' 1"  (1.854 m)   Wt 81.9 kg   SpO2 100%   BMI 23.82 kg/m   General: Elderly male, on mechanical support, tracheostomy tube in place HEENT: Tracking appropriately, trach in place, CDI, no significant secretions or blood. Heart: Regular rhythm S1-S2 Lungs: Bilateral mechanically ventilated sounds Abdomen: Soft nontender nondistended  Labs: Reviewed  Chest x-ray: Appropriate placement of gastric tube.  Assessment: Chronic hypoxemic respiratory failure requiring tracheostomy tube and ongoing mechanical ventilation. History of CVA, left MCA stroke, right-sided  hemiparesis and residual aphasia. Status epilepticus, likely related to previous stroke site. Right-sided, right middle lobe aspiration pneumonia. AKI Urinary retention  Plan: Continue antiepileptic drugs per neurology. Complete 7 days of Zosyn, stop date added, discussed with pharmacy. Stop maintenance fluids Continue tube feeds, free water Still holding antihypertensives. We will avoid restarting ARB in setting of AKI If needed can reinsert Foley after voiding trial.  This patient is critically ill with multiple organ system failure; which, requires frequent high complexity decision making, assessment, support, evaluation, and titration of therapies. This was completed through the application of advanced monitoring technologies and extensive interpretation of multiple databases. During this encounter critical care time was devoted to patient care services described in this note for 32 minutes.   , DO [place] [facility] 10/23/2019 1:32 PM

## 2019-10-23 NOTE — Progress Notes (Signed)
   Scheduled for percutaneous gastric tube in IR 8/23 LD Plavix 8/17  Spoke to pt and mother at bedside Risks and benefits image guided gastrostomy tube placement was discussed with the patient and mother including, but not limited to the need for a barium enema during the procedure, bleeding, infection, peritonitis and/or damage to adjacent structures.  All questions were answered, patient and mother are agreeable to proceed.  Consent signed and in chart.

## 2019-10-24 DIAGNOSIS — Z930 Tracheostomy status: Secondary | ICD-10-CM

## 2019-10-24 LAB — GLUCOSE, CAPILLARY
Glucose-Capillary: 121 mg/dL — ABNORMAL HIGH (ref 70–99)
Glucose-Capillary: 124 mg/dL — ABNORMAL HIGH (ref 70–99)
Glucose-Capillary: 94 mg/dL (ref 70–99)
Glucose-Capillary: 119 mg/dL — ABNORMAL HIGH (ref 70–99)

## 2019-10-24 LAB — CBC
HCT: 22.4 % — ABNORMAL LOW (ref 39.0–52.0)
Hemoglobin: 7.1 g/dL — ABNORMAL LOW (ref 13.0–17.0)
MCH: 29.1 pg (ref 26.0–34.0)
MCHC: 31.7 g/dL (ref 30.0–36.0)
MCV: 91.8 fL (ref 80.0–100.0)
Platelets: 430 10*3/uL — ABNORMAL HIGH (ref 150–400)
RBC: 2.44 MIL/uL — ABNORMAL LOW (ref 4.22–5.81)
RDW: 15.3 % (ref 11.5–15.5)
WBC: 11.7 10*3/uL — ABNORMAL HIGH (ref 4.0–10.5)
nRBC: 0 % (ref 0.0–0.2)

## 2019-10-24 MED ORDER — PHENYTOIN SODIUM 50 MG/ML IJ SOLN: 100.0000 mg | Freq: Four times a day (QID) | INTRAMUSCULAR | Status: DC

## 2019-10-24 MED ORDER — POTASSIUM CHLORIDE 10 MEQ/50ML IV SOLN
10.0000 meq | INTRAVENOUS | Status: AC
  Administered 2019-10-24 (×4): 10 meq via INTRAVENOUS
  Filled 2019-10-24 (×4): qty 50

## 2019-10-24 MED ORDER — PHENYTOIN SODIUM 50 MG/ML IJ SOLN
100.0000 mg | Freq: Three times a day (TID) | INTRAMUSCULAR | Status: DC
  Administered 2019-10-24 – 2019-10-27 (×9): 100 mg via INTRAVENOUS
  Filled 2019-10-24 (×9): qty 2

## 2019-10-24 MED ORDER — POTASSIUM CHLORIDE 10 MEQ/100ML IV SOLN
10.0000 meq | INTRAVENOUS | Status: DC
  Filled 2019-10-24: qty 100

## 2019-10-24 NOTE — Progress Notes (Signed)
MEDICATION RELATED CONSULT NOTE - FOLLOW UP   Pharmacy Consult for Phenytoin Indication: Seizures   No Known Allergies  Patient Measurements: Height: 6\' 1"  (185.4 cm) Weight: 81.8 kg (180 lb 5.4 oz) IBW/kg (Calculated) : 79.9   Vital Signs: Temp: 99.4 F (37.4 C) (08/21 0400) Temp Source: Oral (08/21 0400) BP: 142/69 (08/21 0700) Pulse Rate: 82 (08/21 0700) Intake/Output from previous day: 08/20 0701 - 08/21 0700 In: 1479.5 [I.V.:272.1; NG/GT:775.8; IV Piggyback:431.6] Out: 1450 [Urine:1150; Stool:300] Intake/Output from this shift: No intake/output data recorded.  Labs: Recent Labs    10/21/19 0835 10/22/19 0909 10/23/19 0524  WBC  --  7.0  --   HGB  --  7.1*  --   HCT  --  22.7*  --   PLT  --  401*  --   CREATININE 1.67* 1.53* 1.52*  ALBUMIN 1.8*  --   --   PROT 5.7*  --   --   AST 41  --   --   ALT 36  --   --   ALKPHOS 123  --   --   BILITOT 0.4  --   --    Estimated Creatinine Clearance: 59.1 mL/min (A) (by C-G formula based on SCr of 1.52 mg/dL (H)).    Assessment:  Neuro: Last seizure noted 8/14 - CVA 4/21- basa, Lipitor80,  - SE On IV Vimpat, IV Keppra, IVPhenytoin (currently 3.6 mg/kg/d),   - 8/7 loaded with DPH (goal 15-20) and Keppra - 8/9 Keppra dose reduced d/t renal fxn 750mg  IV q12h > 8/13 incr back to 1000mg  q12h  8/7 DPH 1500mg  IV x1 then 100mg  Q8H (~4 mg/kg/d based on TBW; IBW 80 kg) 8/8 DPH level (post-load): 20.1 >> no adjustments, cont current dosing 8/11 DPH 14.1, albumin 4.1 8/12 DPH 13.4.  Vimpat and Onfi added. 8/13 DPH 12.6, Albumin 2.5, corrected 21. Vimpat and Keppra increased d/t improved CrCl 8/15 DPH 7.5 > corrected = 12.5, free level ~0.95 >> 150mg  x1 then resume 100 Q8hr, renal fx worsening 8/17 DPH 4.3 (Corrects to 9.35 based on albumin from 8/18) - continue current dose per MD  8/18: Phenytoin level 3 adjusts to 8.4  Goal of Therapy:  Phenytoin level 15-20 Seizure control  Plan:  See Neuro note 8/18, con't  phenytoin 100mg  IV q8hr Increase phenytoin dose if seizures return Check level next week.   Corey Dobesh S. 9/15, PharmD, BCPS Clinical Staff Pharmacist [email or web address] , Corey Bennett 10/24/2019,8:09 AM

## 2019-10-24 NOTE — Progress Notes (Signed)
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 Patient is living in a assisted living facility and can ambulate by himself. Patient was found by staff to have a right sided twitching with right eye deviation. He was given Versed and Dilantin and was intubated for airway protection.  Patient was extubated on 8/11 and his seizures was controlled with Keppra, phenytoin, and Vimpat.  He was transferred to [address] for medical management  On 8/14, patient continued to have seizure with twitching of the right upper extremity.  2 mg of lorazepam was given.  Patient continued to desat in the 80s on 15 L of nonrebreather mask with altered mental status.  Patient was intubated to protect airway and transferred to the ICU.  Past Medical History  Left MCA stroke with residual aphasia and right hemiparesis CEA Hypertension Hypercholesterolemia Diabetes   Significant Hospital Events   8/8intubated in ED 8/11 extubation 8/14 reintubation  Consults:  Neurology  Procedures:  8/8 ETT 8/14 ETT 08/19 tracheostomy   Significant Diagnostic Tests:  8/8 CT head>>No acute intracranial abnormality. Large area of encephalomalacia throughout the left MCA territory, consistent with remote infarct. 8/8 CXR>>lungs clear 8/8 UDS>>+ benzo (but given versed in ED); o/w negative 8/8 MRI of brain>>large territory chronic hemorrhagic infarct in the left frontal lobe.Ill-defined hyperintensity throughout the left basal ganglia and left thalamus with mild enhancement in dobutamine likely secondary to encephalitis, infiltrating tumor or  atypical subacute infarction 8/8 MRI of face and trigeminal region>>rules out soft tissue abscess 08/17 CXR>>improved aeration to the right lung base   Micro Data:  8/8 Sars-CoV-2>>negative 8/8 BCx2>> no growth 8/8 UC>> Klebsiella pneumonia 08/14 BCx>> negative 08/14 Trach Cx>> gram positive rods and cocci in pairs 08/17 Trach Cx>> negative   Antimicrobials:  Vancomycin 8/8>> 8/12 Ceftriaxone 8/8>> 8/12 Ampicillin 8/8>>8/11 Zosyn 8/14 >>08/21 Vancomycin 08/14>>08/16  Interim history/subjective:   Patient pulled core track.  No enteral access at this time.  He is awake and alert still remains on ventilator.  Objective   Blood pressure (!) 154/65, pulse 81, temperature 99.4 F (37.4 C), temperature source Oral, resp. rate 18, height 6\' 1"  (1.854 m), weight 81.8 kg, SpO2 100 %.    Vent Mode: PRVC FiO2 (%):  [30 %] 30 % Set Rate:  [16 bmp] 16 bmp Vt Set:  [630 mL] 630 mL PEEP:  [5 cmH20] 5 cmH20 Plateau Pressure:  [14 cmH20-17 cmH20] 16 cmH20   Intake/Output Summary (Last 24 hours) at 10/24/2019 0716 Last data filed at 10/24/2019 0600 Gross per 24 hour  Intake 1472.09 ml  Output 1450 ml  Net 22.09 ml   Filed Weights   10/22/19 0346 10/23/19 0500 10/24/19 0500  Weight: 78.9 kg 81.9 kg 81.8 kg    Examination: General: Elderly male, tracheostomy dependent on mechanical support HENT: Trach in place clean dry and intact no evidence of bleeding no significant secretions Lungs: Clear to auscultation bilaterally bilateral ventilated breath sounds Cardiovascular: The rate rhythm, S1-S2 Abdomen: Nondistended Extremities: No significant edema in lower extremities, right extremity slightly edematous Neuro: Awake alert following commands  Assessment & Plan:   Chronic respiratory failure with hypoxemia requiring tracheostomy tube placement  and mechanical ventilatory support. Possible right sided aspiration pneumonia, resolving Plan: Continue to wean in pressure support  trials as tolerated. Off all sedation. Continue routine trach care Complete 7 days antimicrobials stop date already in place.  Status epilepticus in the setting of recent large left MCA stroke Plan: Continue current AED regimen  Acute renal failure Maintenance fluids were stopped yesterday Patient was on tube feeds with free water however patient has pulled core track. We will continue to observe. If needed can restart maintenance fluids until PEG tube is placed on Monday.  Hypertension -BP within normal limits Avoid initiation of ARB in setting of AKI. Holding home blood pressure medications at this time  Urinary Retention Foley reinserted after failing voiding trial.    Diabetes type 2 with hyperglycemia Plan: Since he lost his enteral access for tube feeds. Lantus has been discontinued. Continue CBGs with SSI only.  Anemia, ICU related, multifactorial, frequent blood draws Plan: Continue to observe for any signs of active bleeding Conservative transfusion threshold Repeat H&H today.  Hypokalemia Plan: Replete  Best practice:  Diet: TF Pain/Anxiety/Delirium protocol (if indicated): Precedex VAP protocol (if indicated): Yes DVT prophylaxis: enoxaparin GI prophylaxis: PPI Glucose control: SSI/ Lantus Mobility: BR Code Status: Full Family Communication: Family to be updated. Disposition: ICU  Labs   CBC: Recent Labs  Lab 10/17/19 1231 10/18/19 0554 10/22/19 0909  WBC  --  11.8* 7.0  NEUTROABS  --  9.9*  --   HGB 8.5* 7.4* 7.1*  HCT 25.0* 22.3* 22.7*  MCV  --  87.5 91.5  PLT  --  PLATELET CLUMPS NOTED ON SMEAR, UNABLE TO ESTIMATE 401*    Basic Metabolic Panel: Recent Labs  Lab 10/18/19 1453 10/20/19 1142 10/21/19 0835 10/22/19 0909 10/23/19 0524  NA 139 140 142 141 139  K 3.3* 4.3 3.8 3.4* 3.4*  CL 107 109 111 110 109  CO2 21* 18* 21* 23 22  GLUCOSE 296* 333* 175* 129* 123*  BUN 29* 26* 24* 24* 24*  CREATININE 1.85* 1.74* 1.67* 1.53* 1.52*   CALCIUM 8.1* 7.9* 8.0* 8.0* 7.8*   GFR: Estimated Creatinine Clearance: 59.1 mL/min (A) (by C-G formula based on SCr of 1.52 mg/dL (H)). Recent Labs  Lab 10/18/19 0554 10/22/19 0909  WBC 11.8* 7.0    Liver Function Tests: Recent Labs  Lab 10/21/19 0835  AST 41  ALT 36  ALKPHOS 123  BILITOT 0.4  PROT 5.7*  ALBUMIN 1.8*   No results for input(s): LIPASE, AMYLASE in the last 168 hours. No results for input(s): AMMONIA in the last 168 hours.  ABG    Component Value Date/Time   PHART 7.339 (L) 10/17/2019 1231   PCO2ART 36.7 10/17/2019 1231   PO2ART 117 (H) 10/17/2019 1231   HCO3 19.6 (L) 10/17/2019 1231   TCO2 21 (L) 10/17/2019 1231   ACIDBASEDEF 5.0 (H) 10/17/2019 1231   O2SAT 98.0 10/17/2019 1231     Coagulation Profile: Recent Labs  Lab 10/22/19 0909  INR 1.2    Cardiac Enzymes: No results for input(s): CKTOTAL, CKMB, CKMBINDEX, TROPONINI in the last 168 hours.  HbA1C: Hgb A1c MFr Bld  Date/Time Value Ref Range Status  09/04/2019 10:00 AM 7.2 (H) 4.8 - 5.6 % Final    Comment:    (NOTE) Pre diabetes:          5.7%-6.4%  Diabetes:              >6.4%  Glycemic control for   <7.0% adults with diabetes  CBG: Recent Labs  Lab 10/23/19 0737 10/23/19 1107 10/23/19 1430 10/23/19 2341 10/24/19 0449  GLUCAP 137* 167* 228* 143* 121*    This patient is critically ill with multiple organ system failure; which, requires frequent high complexity decision making, assessment, support, evaluation, and titration of therapies. This was completed through the application of advanced monitoring technologies and extensive interpretation of multiple databases. During this encounter critical care time was devoted to patient care services described in this note for 32 minutes.  Josephine Igo, DO [place] [facility] 10/24/2019 7:16 AM

## 2019-10-25 LAB — CBC
HCT: 20.9 % — ABNORMAL LOW (ref 39.0–52.0)
HCT: 25.2 % — ABNORMAL LOW (ref 39.0–52.0)
Hemoglobin: 6.7 g/dL — CL (ref 13.0–17.0)
Hemoglobin: 8.1 g/dL — ABNORMAL LOW (ref 13.0–17.0)
MCH: 29.6 pg (ref 26.0–34.0)
MCH: 29.6 pg (ref 26.0–34.0)
MCHC: 32.1 g/dL (ref 30.0–36.0)
MCHC: 32.1 g/dL (ref 30.0–36.0)
MCV: 92.5 fL (ref 80.0–100.0)
MCV: 92 fL (ref 80.0–100.0)
Platelets: 401 10*3/uL — ABNORMAL HIGH (ref 150–400)
Platelets: 415 10*3/uL — ABNORMAL HIGH (ref 150–400)
RBC: 2.26 MIL/uL — ABNORMAL LOW (ref 4.22–5.81)
RBC: 2.74 MIL/uL — ABNORMAL LOW (ref 4.22–5.81)
RDW: 15.2 % (ref 11.5–15.5)
RDW: 14.7 % (ref 11.5–15.5)
WBC: 9.3 10*3/uL (ref 4.0–10.5)
WBC: 9.3 10*3/uL (ref 4.0–10.5)
nRBC: 0 % (ref 0.0–0.2)
nRBC: 0 % (ref 0.0–0.2)

## 2019-10-25 LAB — COMPREHENSIVE METABOLIC PANEL
ALT: 68 U/L — ABNORMAL HIGH (ref 0–44)
AST: 47 U/L — ABNORMAL HIGH (ref 15–41)
Albumin: 2 g/dL — ABNORMAL LOW (ref 3.5–5.0)
Alkaline Phosphatase: 133 U/L — ABNORMAL HIGH (ref 38–126)
Anion gap: 11 (ref 5–15)
BUN: 15 mg/dL (ref 6–20)
CO2: 22 mmol/L (ref 22–32)
Calcium: 8.1 mg/dL — ABNORMAL LOW (ref 8.9–10.3)
Chloride: 108 mmol/L (ref 98–111)
Creatinine, Ser: 1.44 mg/dL — ABNORMAL HIGH (ref 0.61–1.24)
GFR calc Af Amer: >60 mL/min (ref >60)
GFR calc non Af Amer: 53 mL/min — ABNORMAL LOW (ref >60)
Glucose, Bld: 127 mg/dL — ABNORMAL HIGH (ref 70–99)
Potassium: 3.4 mmol/L — ABNORMAL LOW (ref 3.5–5.1)
Sodium: 141 mmol/L (ref 135–145)
Total Bilirubin: 0.4 mg/dL (ref 0.3–1.2)
Total Protein: 5.9 g/dL — ABNORMAL LOW (ref 6.5–8.1)

## 2019-10-25 LAB — GLUCOSE, CAPILLARY
Glucose-Capillary: 131 mg/dL — ABNORMAL HIGH (ref 70–99)
Glucose-Capillary: 123 mg/dL — ABNORMAL HIGH (ref 70–99)
Glucose-Capillary: 123 mg/dL — ABNORMAL HIGH (ref 70–99)
Glucose-Capillary: 123 mg/dL — ABNORMAL HIGH (ref 70–99)
Glucose-Capillary: 106 mg/dL — ABNORMAL HIGH (ref 70–99)

## 2019-10-25 LAB — PREPARE RBC (CROSSMATCH)

## 2019-10-25 MED ORDER — SODIUM CHLORIDE 0.9% IV SOLUTION: Freq: Once | INTRAVENOUS | Status: DC

## 2019-10-25 NOTE — Progress Notes (Signed)
eLink Physician-Brief Progress Note Patient Name: Corey Bennett DOB: Jun 12, 1960 MRN: [id number]   Date of Service  10/25/2019  HPI/Events of Note  Anemia - Hgb = 6.7.  eICU Interventions  Transfuse 1 unit PRBC.      Intervention Category Major Interventions: Other:  Maisha Bogen Dennard Nip 10/25/2019, 6:18 AM

## 2019-10-25 NOTE — Progress Notes (Signed)
Attempted to call family to obtain blood consent, voicemail left to call the unit.

## 2019-10-25 NOTE — Progress Notes (Signed)
CRITICAL VALUE ALERT  Critical Value:  Hgb 6.7  Date & Time Notied:  10/25/19 0600  Provider Notified: E-link  Orders Received/Actions taken: awaiting orders

## 2019-10-25 NOTE — Progress Notes (Signed)
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 Patient is living in a assisted living facility and can ambulate by himself. Patient was found by staff to have a right sided twitching with right eye deviation. He was given Versed and Dilantin and was intubated for airway protection.  Patient was extubated on 8/11 and his seizures was controlled with Keppra, phenytoin, and Vimpat.  He was transferred to [address] for medical management  On 8/14, patient continued to have seizure with twitching of the right upper extremity.  2 mg of lorazepam was given.  Patient continued to desat in the 80s on 15 L of nonrebreather mask with altered mental status.  Patient was intubated to protect airway and transferred to the ICU.  Past Medical History  Left MCA stroke with residual aphasia and right hemiparesis CEA Hypertension Hypercholesterolemia Diabetes   Significant Hospital Events   8/8intubated in ED 8/11 extubation 8/14 reintubation  Consults:  Neurology  Procedures:  8/8 ETT 8/14 ETT 08/19 Tracheostomy   Significant Diagnostic Tests:  8/8 CT head>>No acute intracranial abnormality. Large area of encephalomalacia throughout the left MCA territory, consistent with remote infarct. 8/8 CXR>>lungs clear 8/8 UDS>>+ benzo (but given versed in ED); o/w negative 8/8 MRI of brain>>large territory chronic hemorrhagic infarct in the left frontal lobe.Ill-defined hyperintensity throughout the left basal ganglia and left thalamus with mild enhancement in dobutamine likely secondary to encephalitis, infiltrating tumor or  atypical subacute infarction 8/8 MRI of face and trigeminal region>>rules out soft tissue abscess 08/17 CXR>>improved aeration to the right lung base   Micro Data:  8/8 Sars-CoV-2>>negative 8/8 BCx2>> no growth 8/8 UC>> Klebsiella pneumonia 08/14 BCx>> negative 08/14 Trach Cx>> gram positive rods and cocci in pairs 08/17 Trach Cx>> negative   Antimicrobials:  Vancomycin 8/8>> 8/12 Ceftriaxone 8/8>> 8/12 Ampicillin 8/8>>8/11 Zosyn 8/14 >>08/21 Vancomycin 08/14>>08/16  Interim history/subjective:   Patient seen this am. He is still requiring full vent support. Not tolerating PS trials. Low grade temp overnight.   Objective   Blood pressure (!) 152/73, pulse 81, temperature 99.5 F (37.5 C), temperature source Oral, resp. rate 13, height 6\' 1"  (1.854 m), weight 81.8 kg, SpO2 99 %.    Vent Mode: PRVC FiO2 (%):  [30 %] 30 % Set Rate:  [16 bmp] 16 bmp Vt Set:  [630 mL] 630 mL PEEP:  [5 cmH20] 5 cmH20 Pressure Support:  [15 cmH20] 15 cmH20 Plateau Pressure:  [15 cmH20-16 cmH20] 16 cmH20   Intake/Output Summary (Last 24 hours) at 10/25/2019 0658 Last data filed at 10/25/2019 0600 Gross per 24 hour  Intake 587.02 ml  Output 1920 ml  Net -1332.98 ml   Filed Weights   10/22/19 0346 10/23/19 0500 10/24/19 0500  Weight: 78.9 kg 81.9 kg 81.8 kg    Examination: General: male, trach, vent dependent HENT: Trach in place, no sig bleeding of secretions  Lungs: BL vented breaths  Cardiovascular: RRR, s1 s2 no mrg  Abdomen: NT ND  Extremities: no edema  Neuro: alert oriented following commands   Assessment & Plan:   Chronic respiratory failure with hypoxemia requiring tracheostomy tube placement and mechanical ventilatory support. Possible right sided aspiration  pneumonia, resolving Plan: PS trials as tolerated Ok to do short time limits with higher PS if needed  Of all sedation  Completed 7 days of abx  Status epilepticus in the setting of recent large left MCA  stroke Plan: Continue current AED regimen  Acute renal failure Improving  Follow UOP and BMP  Avoid nephrotoxic drugs   Hypertension -BP within normal limits Avoid initiation of ARB in setting of AKI. - holding home BP meds at this time   Urinary Retention - foley was reinserted after failing voiding trial   Diabetes type 2 with hyperglycemia  Plan: Holding long acting  cbg ssi  Plans for PEG tomorrow   Anemia, ICU related, multifactorial, frequent blood draws Plan: S/p 1 U pRBCs overnight  No overt signs of bleeding  Continue to observe H&H   Hypokalemia Plan: Replete as needed   Best practice:  Diet: TF Pain/Anxiety/Delirium protocol (if indicated): off VAP protocol (if indicated): Yes DVT prophylaxis: enoxaparin GI prophylaxis: PPI Glucose control: SSI/ Lantus Mobility: BR Code Status: Full Family Communication: to be updated  Disposition: ICU  Labs   CBC: Recent Labs  Lab 10/22/19 0909 10/24/19 1635 10/25/19 0507  WBC 7.0 11.7* 9.3  HGB 7.1* 7.1* 6.7*  HCT 22.7* 22.4* 20.9*  MCV 91.5 91.8 92.5  PLT 401* 430* 401*    Basic Metabolic Panel: Recent Labs  Lab 10/20/19 1142 10/21/19 0835 10/22/19 0909 10/23/19 0524 10/25/19 0730  NA 140 142 141 139 141  K 4.3 3.8 3.4* 3.4* 3.4*  CL 109 111 110 109 108  CO2 18* 21* 23 22 22   GLUCOSE 333* 175* 129* 123* 127*  BUN 26* 24* 24* 24* 15  CREATININE 1.74* 1.67* 1.53* 1.52* 1.44*  CALCIUM 7.9* 8.0* 8.0* 7.8* 8.1*   GFR: Estimated Creatinine Clearance: 59.1 mL/min (A) (by C-G formula based on SCr of 1.52 mg/dL (H)). Recent Labs  Lab 10/22/19 0909 10/24/19 1635 10/25/19 0507  WBC 7.0 11.7* 9.3    Liver Function Tests: Recent Labs  Lab 10/21/19 0835  AST 41  ALT 36  ALKPHOS 123  BILITOT 0.4  PROT 5.7*  ALBUMIN 1.8*   No results for input(s): LIPASE, AMYLASE in the last 168 hours. No results for input(s): AMMONIA in the last 168 hours.  ABG    Component Value Date/Time   PHART  7.339 (L) 10/17/2019 1231   PCO2ART 36.7 10/17/2019 1231   PO2ART 117 (H) 10/17/2019 1231   HCO3 19.6 (L) 10/17/2019 1231   TCO2 21 (L) 10/17/2019 1231   ACIDBASEDEF 5.0 (H) 10/17/2019 1231   O2SAT 98.0 10/17/2019 1231     Coagulation Profile: Recent Labs  Lab 10/22/19 0909  INR 1.2    Cardiac Enzymes: No results for input(s): CKTOTAL, CKMB, CKMBINDEX, TROPONINI in the last 168 hours.  HbA1C: Hgb A1c MFr Bld  Date/Time Value Ref Range Status  09/04/2019 10:00 AM 7.2 (H) 4.8 - 5.6 % Final    Comment:    (NOTE) Pre diabetes:          5.7%-6.4%  Diabetes:              >6.4%  Glycemic control for   <7.0% adults with diabetes     CBG: Recent Labs  Lab 10/24/19 0449 10/24/19 0741 10/24/19 1143 10/24/19 1518 10/25/19 0324  GLUCAP 121* 124* 94 119* 131*    This patient is critically ill with multiple organ system failure; which, requires frequent high complexity decision making, assessment, support, evaluation, and titration  of therapies. This was completed through the application of advanced monitoring technologies and extensive interpretation of multiple databases. During this encounter critical care time was devoted to patient care services described in this note for 32 minutes.  Josephine Igo, DO [facility] 10/25/2019 6:58 AM

## 2019-10-26 ENCOUNTER — Inpatient Hospital Stay (HOSPITAL_COMMUNITY): Payer: BC Managed Care – PPO

## 2019-10-26 LAB — BPAM RBC
Blood Product Expiration Date: 202109152359
ISSUE DATE / TIME: 202108221252
Unit Type and Rh: 7300

## 2019-10-26 LAB — COMPREHENSIVE METABOLIC PANEL
ALT: 58 U/L — ABNORMAL HIGH (ref 0–44)
AST: 40 U/L (ref 15–41)
Albumin: 2 g/dL — ABNORMAL LOW (ref 3.5–5.0)
Alkaline Phosphatase: 126 U/L (ref 38–126)
Anion gap: 11 (ref 5–15)
BUN: 11 mg/dL (ref 6–20)
CO2: 22 mmol/L (ref 22–32)
Calcium: 8.1 mg/dL — ABNORMAL LOW (ref 8.9–10.3)
Chloride: 106 mmol/L (ref 98–111)
Creatinine, Ser: 1.24 mg/dL (ref 0.61–1.24)
GFR calc Af Amer: >60 mL/min (ref >60)
GFR calc non Af Amer: >60 mL/min (ref >60)
Glucose, Bld: 117 mg/dL — ABNORMAL HIGH (ref 70–99)
Potassium: 3.3 mmol/L — ABNORMAL LOW (ref 3.5–5.1)
Sodium: 139 mmol/L (ref 135–145)
Total Bilirubin: 0.5 mg/dL (ref 0.3–1.2)
Total Protein: 5.9 g/dL — ABNORMAL LOW (ref 6.5–8.1)

## 2019-10-26 LAB — TYPE AND SCREEN
ABO/RH(D): B POS
Antibody Screen: NEGATIVE
Unit division: 0

## 2019-10-26 LAB — CBC
HCT: 25.6 % — ABNORMAL LOW (ref 39.0–52.0)
Hemoglobin: 8.3 g/dL — ABNORMAL LOW (ref 13.0–17.0)
MCH: 29.4 pg (ref 26.0–34.0)
MCHC: 32.4 g/dL (ref 30.0–36.0)
MCV: 90.8 fL (ref 80.0–100.0)
Platelets: 441 10*3/uL — ABNORMAL HIGH (ref 150–400)
RBC: 2.82 MIL/uL — ABNORMAL LOW (ref 4.22–5.81)
RDW: 14.5 % (ref 11.5–15.5)
WBC: 8.4 10*3/uL (ref 4.0–10.5)
nRBC: 0 % (ref 0.0–0.2)

## 2019-10-26 LAB — GLUCOSE, CAPILLARY
Glucose-Capillary: 103 mg/dL — ABNORMAL HIGH (ref 70–99)
Glucose-Capillary: 112 mg/dL — ABNORMAL HIGH (ref 70–99)
Glucose-Capillary: 127 mg/dL — ABNORMAL HIGH (ref 70–99)
Glucose-Capillary: 133 mg/dL — ABNORMAL HIGH (ref 70–99)
Glucose-Capillary: 136 mg/dL — ABNORMAL HIGH (ref 70–99)
Glucose-Capillary: 140 mg/dL — ABNORMAL HIGH (ref 70–99)
Glucose-Capillary: 182 mg/dL — ABNORMAL HIGH (ref 70–99)
Glucose-Capillary: 130 mg/dL — ABNORMAL HIGH (ref 70–99)
Glucose-Capillary: 146 mg/dL — ABNORMAL HIGH (ref 70–99)
Glucose-Capillary: 130 mg/dL — ABNORMAL HIGH (ref 70–99)

## 2019-10-26 IMAGING — DX DG ABD PORTABLE 1V
1 series · 1 of 1 positions shown · non-contrast
Comparison: Abdominal CT [DATE]

CLINICAL DATA: Advancement of nasogastric tube.

EXAM:
PORTABLE ABDOMEN - 1 VIEW

[abdomen kub]
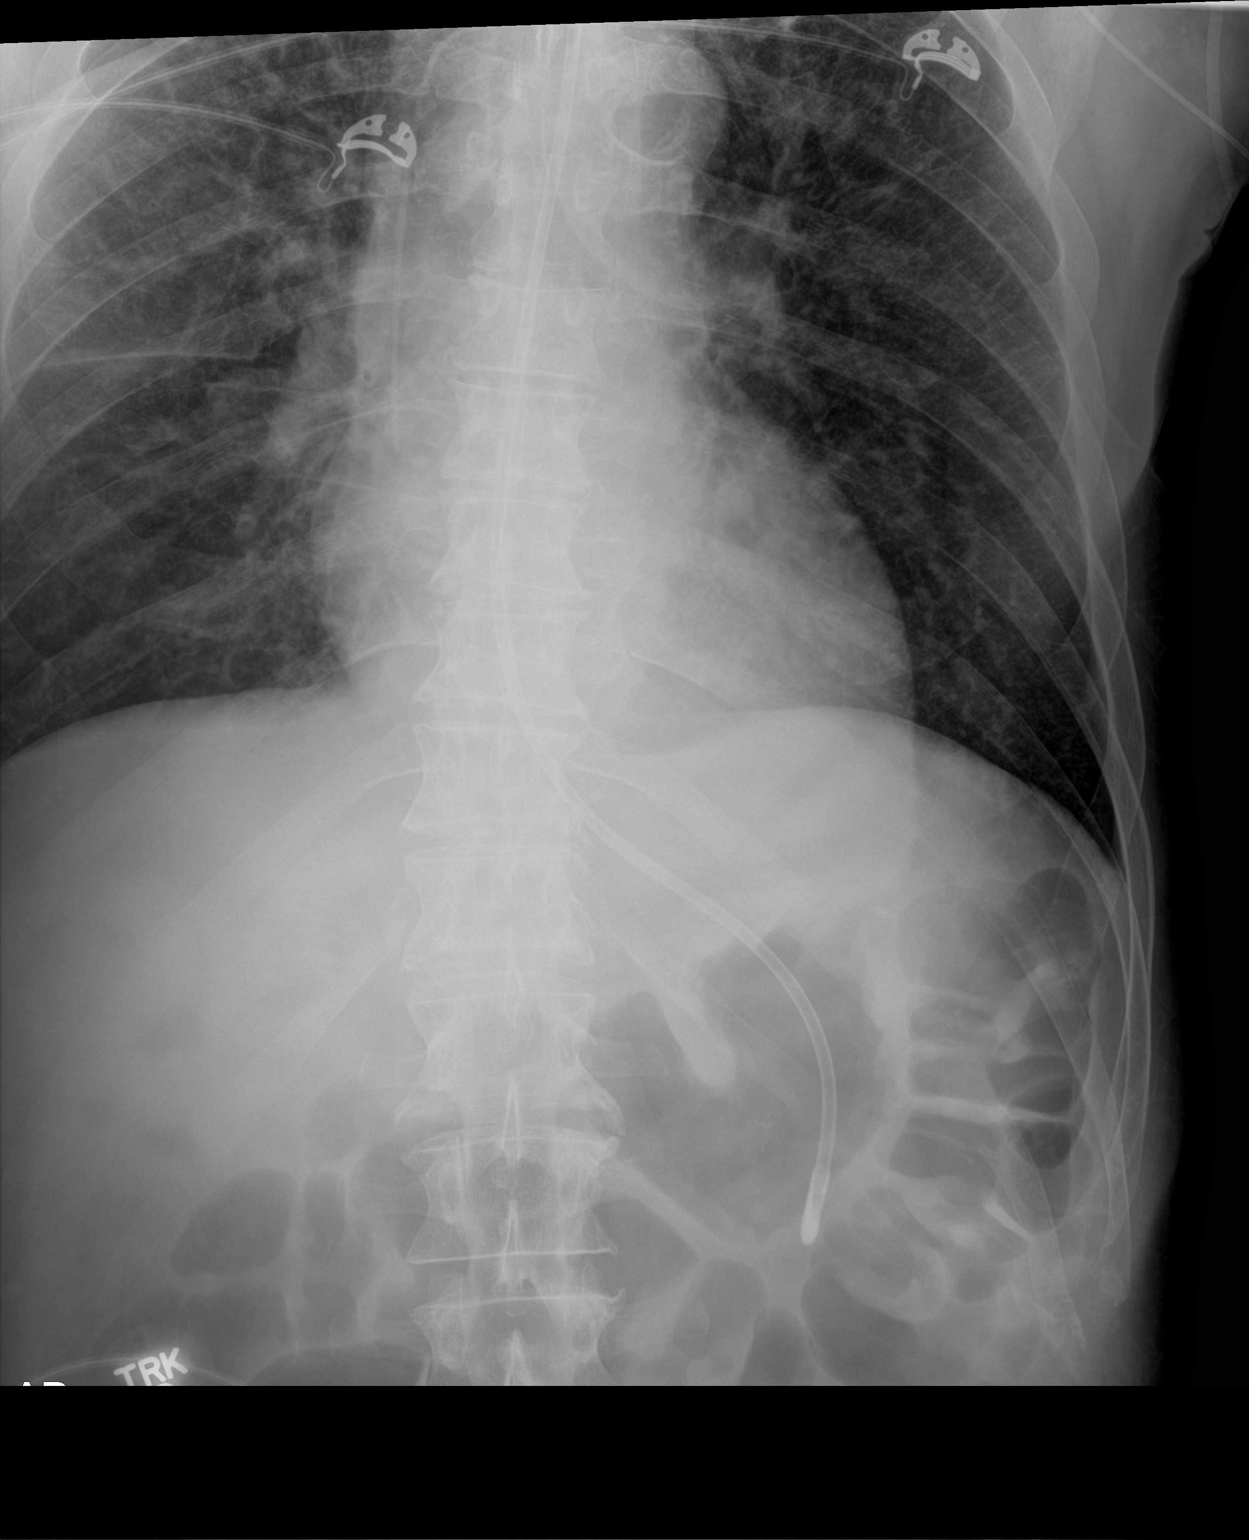

[1 of 1 positions shown; findings below may reference images not displayed]

FINDINGS: Weighted enteric tube with tip in the left upper quadrant in the
region of the body of the stomach. The tube does not appear to be
post pyloric. Air-filled but nondilated colon in the upper abdomen.
No small bowel distension. Improving basilar opacities from prior
CT.
IMPRESSION: Weighted enteric tube with tip in the left upper quadrant in the
region of the body of the stomach.

## 2019-10-26 MED ORDER — AMLODIPINE BESYLATE 5 MG PO TABS
5.0000 mg | ORAL_TABLET | Freq: Every day | ORAL | Status: DC
  Administered 2019-10-27: 5 mg
  Filled 2019-10-26: qty 1

## 2019-10-26 MED ORDER — POTASSIUM CHLORIDE 10 MEQ/50ML IV SOLN
10.0000 meq | INTRAVENOUS | Status: AC
  Administered 2019-10-26 (×2): 10 meq via INTRAVENOUS
  Filled 2019-10-26 (×2): qty 50

## 2019-10-26 MED ORDER — AMLODIPINE BESYLATE 2.5 MG PO TABS: 5.0000 mg | ORAL_TABLET | Freq: Every day | ORAL | Status: DC

## 2019-10-26 MED ORDER — POLYETHYLENE GLYCOL 3350 17 G PO PACK: 17.0000 g | PACK | Freq: Every day | ORAL | Status: DC | PRN

## 2019-10-26 MED ORDER — QUETIAPINE FUMARATE 50 MG PO TABS
50.0000 mg | ORAL_TABLET | Freq: Every day | ORAL | Status: DC
  Administered 2019-10-26 – 2019-11-20 (×25): 50 mg
  Filled 2019-10-26 (×16): qty 1
  Filled 2019-10-26: qty 2
  Filled 2019-10-26 (×5): qty 1
  Filled 2019-10-26: qty 2
  Filled 2019-10-26 (×2): qty 1

## 2019-10-26 MED ORDER — SENNOSIDES-DOCUSATE SODIUM 8.6-50 MG PO TABS
1.0000 | ORAL_TABLET | Freq: Two times a day (BID) | ORAL | Status: DC
  Administered 2019-10-26: 1 via ORAL
  Filled 2019-10-26 (×2): qty 1

## 2019-10-26 NOTE — Plan of Care (Signed)
  Problem: Respiratory: Goal: Ability to maintain a clear airway and adequate ventilation will improve Outcome: Progressing   Problem: Role Relationship: Goal: Method of communication will improve Outcome: Progressing   Problem: Health Behavior/Discharge Planning: Goal: Ability to manage health-related needs will improve Outcome: Progressing   Problem: Clinical Measurements: Goal: Ability to maintain clinical measurements within normal limits will improve Outcome: Progressing Goal: Will remain free from infection Outcome: Progressing Goal: Diagnostic test results will improve Outcome: Progressing Goal: Respiratory complications will improve Outcome: Progressing Goal: Cardiovascular complication will be avoided Outcome: Progressing   Problem: Coping: Goal: Level of anxiety will decrease Outcome: Progressing   Problem: Pain Managment: Goal: General experience of comfort will improve Outcome: Progressing

## 2019-10-26 NOTE — Procedures (Signed)
Cortrak  Person Inserting Tube:  Muad Noga E, RD Tube Type:  Cortrak - 43 inches Tube Location:  Right nare Initial Placement:  Stomach Secured by: Bridle Technique Used to Measure Tube Placement:  Documented cm marking at nare/ corner of mouth Cortrak Secured At:  65 cm    Cortrak Tube Team Note:  Consult received to place a Cortrak feeding tube.     X-ray is required, abdominal x-ray has been ordered by the Cortrak team. Please confirm tube placement before using the Cortrak tube.   If the tube becomes dislodged please keep the tube and contact the Cortrak team at [email or web address] ([email or web address]) for replacement.  If after hours and replacement cannot be delayed, place a NG tube and confirm placement with an abdominal x-ray.    Alberta Lenhard, MS, RD, LDN RD pager number and weekend/on-call pager number located in Amion.   

## 2019-10-26 NOTE — Plan of Care (Signed)
  Problem: Activity: Goal: Ability to tolerate increased activity will improve Outcome: Progressing   Problem: Respiratory: Goal: Ability to maintain a clear airway and adequate ventilation will improve Outcome: Progressing   Problem: Role Relationship: Goal: Method of communication will improve Outcome: Progressing   Problem: Clinical Measurements: Goal: Respiratory complications will improve Outcome: Progressing Goal: Cardiovascular complication will be avoided Outcome: Progressing   Problem: Pain Managment: Goal: General experience of comfort will improve Outcome: Progressing

## 2019-10-26 NOTE — Progress Notes (Signed)
SLP Cancellation Note  Patient Details Name: Corey Bennett MRN: [id number] DOB: 1960-08-05   Cancelled treatment:       Reason Eval/Treat Not Completed: Medical issues which prohibited therapy. Pt still requiring full vent support over the weekend. Plan is for PEG today. Will continue to follow for readiness.    [name]., M.A. CCC-SLP Acute Rehabilitation Services Pager [phone] Office [phone]  10/26/2019, 7:03 AM

## 2019-10-26 NOTE — Progress Notes (Signed)
SLP Note  Patient Details Name: Corey Bennett MRN: [id number] DOB: Apr 30, 1960   SLP has been in contact with RN throughout the day regarding plan for alternative nutrition. RN says that family reports having a PEG previously, but very quickly returning to PO diet. Pt did seem to be on regular solids and thin liquids PTA. Since admission, SLP evaluation revealed concern for aspiration and reduced secretion management. We were following for potential to complete instrumental swallow study when he was reintubated; now with trach.   Sounds like his family was hoping to give the pt more time to see how swallowing function may progress before proceeding with PEG. SLP can perform an instrumental swallow study to assess readiness to return to PO diet, but would favor giving the pt a little bit more time as he is just starting to wean from the vent. Given his dysphagia prior to reintubation, I think it would benefit him to try PMV before proceeding with FEES or MBS to optimize his swallow/cough function. Recommend considering more temporary alternative means of nutrition so that he can have more time to work on dysphagia goals. Will check in on next date to see if he is ready for PMV and/or swallow study.     [name]., M.A. CCC-SLP Acute Rehabilitation Services Pager [phone] Office [phone]  10/26/2019, 2:21 PM

## 2019-10-26 NOTE — Progress Notes (Signed)
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 Patient is living in a assisted living facility and can ambulate by himself. Patient was found by staff to have a right sided twitching with right eye deviation. He was given Versed and Dilantin and was intubated for airway protection.  Patient was extubated on 8/11 and his seizures was controlled with Keppra, phenytoin, and Vimpat.  He was transferred to [address] for medical management  On 8/14, patient continued to have seizure with twitching of the right upper extremity.  2 mg of lorazepam was given.  Patient continued to desat in the 80s on 15 L of nonrebreather mask with altered mental status.  Patient was intubated to protect airway and transferred to the ICU.  Past Medical History  Left MCA stroke with residual aphasia and right hemiparesis CEA Hypertension Hypercholesterolemia Diabetes   Significant Hospital Events   8/8intubated in ED 8/11 extubation 8/14 reintubation 8/21 transfusion 1 U PRBc   Consults:  Neurology Speech Pathology   Procedures:  8/8 ETT 8/14 ETT 08/19 Tracheostomy  8/23 PEG tube  Significant Diagnostic Tests:  8/8 CT head>>No acute intracranial abnormality. Large area of encephalomalacia throughout the left MCA territory, consistent with remote infarct. 8/8 CXR>>lungs clear 8/8 UDS>>+ benzo (but given versed in ED); o/w negative 8/8 MRI of brain>>large territory chronic hemorrhagic infarct in the left frontal lobe.Ill-defined hyperintensity throughout the left basal ganglia and left thalamus with mild enhancement in  dobutamine likely secondary to encephalitis, infiltrating tumor or atypical subacute infarction 8/8 MRI of face and trigeminal region>>rules out soft tissue abscess 08/17 CXR>>improved aeration to the right lung base  8/17 CT Abdomen wo Contrast >>cholelithiasis/trace ascites/aorthic atherosclerosis   Micro Data:  8/8 Sars-CoV-2>>negative 8/8 BCx2>> no growth 8/8 UC>> Klebsiella pneumonia 08/14 BCx>> negative 08/14 Trach Cx>> gram positive rods and cocci in pairs 08/17 Trach Cx>> negative   Antimicrobials:  Vancomycin 8/8>> 8/12 Ceftriaxone 8/8>> 8/12 Ampicillin 8/8>>8/11 Zosyn 8/14 >>08/21 Vancomycin 08/14>>08/16  Interim history/subjective:   Patient awake and alert this AM. His brother [name] is at bedside. Pt is alert and able to nod in agreement with plan for today. He reports some mild lower abdominal pain that is unchanged. He continues to have normal stool output but family would like escalation of bowel regimen. He denies any dysuria. Pt and brother updated on plan for trach collar today.  Objective   Blood pressure (!) 169/79, pulse 88, temperature 99.6 F (37.6 C), temperature source Oral, resp. rate (!) 30, height 6\' 1"  (1.854 m), weight 81.8 kg, SpO2 100 %.    Vent Mode: PSV;CPAP FiO2 (%):  [30 %-35 %] 35 % Set Rate:  [16 bmp] 16 bmp Vt Set:  [630 mL] 630 mL PEEP:  [5 cmH20] 5 cmH20 Pressure Support:  [10 cmH20] 10 cmH20 Plateau Pressure:  [12 cmH20] 12 cmH20   Intake/Output Summary (Last 24 hours) at 10/26/2019 1235 Last data filed at 10/26/2019 1200 Gross per 24 hour  Intake 279.73 ml  Output 1440 ml  Net -1160.27 ml   Filed Weights   10/22/19 0346 10/23/19 0500 10/24/19 0500  Weight: 78.9 kg 81.9 kg 81.8 kg  Examination: General: male, trach, vent dependent, NAD HENT: Trach in place, respiratory secretions  Lungs: BL vented breaths, CTAB, breath sounds equal   Cardiovascular: RRR, s1 s2 no mrg  Abdomen: lower abdominal tenderness Extremities:  trace edema in BLE Neuro: alert oriented following commands   Assessment & Plan:   1. Chronic respiratory failure with hypoxemia requiring tracheostomy tube placement and mechanical ventilatory support. Plan: -Trach collar planned for today -Speech path to reevaluate swallow -Oral suction with tube care  PS trials as tolerated Ok to do short time limits with higher PS if needed   2. Status epilepticus in the setting of recent large left MCA stroke Plan: -No arm/eye twitching since ICU transfer -Continue current AED regimen/ Dilantin and Vimpat  3. Acute renal failure Resolved, Cr 1.24 this AM  -Continue to follow UOP and BMP  -Avoid nephrotoxic drugs   4. Hypertension BP elevated this AM, 167/80 Avoid initiation of ARB in setting of AKI. - Restart home Norovasc 5 mg today  5. Urinary Retention - Foley in place. Pt denies dysuria - Continue bethanechol 15 mg    6. Diabetes type 2 with hyperglycemia  Plan: Holding long acting  cbg ssi  Plans for PEG today  7. Anemia, ICU related, multifactorial, frequent blood draws Plan: S/p 1 U pRBCs 8/21 No overt signs of bleeding  -Continue to observe H&H   8. Hypokalemia Plan: - K+ 3.3 today, repleted - Replete as needed/CTM   Best practice:  Diet: TF Pain/Anxiety/Delirium protocol (if indicated): off VAP protocol (if indicated): Yes DVT prophylaxis: enoxaparin GI prophylaxis: PPI Glucose control: SSI/ Lantus Mobility: BR Code Status: Full Family Communication: to be updated  Disposition: ICU  Labs   CBC: Recent Labs  Lab 10/22/19 0909 10/24/19 1635 10/25/19 0507 10/25/19 1735 10/26/19 0500  WBC 7.0 11.7* 9.3 9.3 8.4  HGB 7.1* 7.1* 6.7* 8.1* 8.3*  HCT 22.7* 22.4* 20.9* 25.2* 25.6*  MCV 91.5 91.8 92.5 92.0 90.8  PLT 401* 430* 401* 415* 441*    Basic Metabolic Panel: Recent Labs  Lab 10/21/19 0835 10/22/19 0909 10/23/19 0524 10/25/19 0730 10/26/19 0500  NA 142 141 139 141 139  K 3.8 3.4* 3.4* 3.4*  3.3*  CL 111 110 109 108 106  CO2 21* 23 22 22 22   GLUCOSE 175* 129* 123* 127* 117*  BUN 24* 24* 24* 15 11  CREATININE 1.67* 1.53* 1.52* 1.44* 1.24  CALCIUM 8.0* 8.0* 7.8* 8.1* 8.1*   GFR: Estimated Creatinine Clearance: 72.5 mL/min (by C-G formula based on SCr of 1.24 mg/dL). Recent Labs  Lab 10/24/19 1635 10/25/19 0507 10/25/19 1735 10/26/19 0500  WBC 11.7* 9.3 9.3 8.4    Liver Function Tests: Recent Labs  Lab 10/21/19 0835 10/25/19 0730 10/26/19 0500  AST 41 47* 40  ALT 36 68* 58*  ALKPHOS 123 133* 126  BILITOT 0.4 0.4 0.5  PROT 5.7* 5.9* 5.9*  ALBUMIN 1.8* 2.0* 2.0*   No results for input(s): LIPASE, AMYLASE in the last 168 hours. No results for input(s): AMMONIA in the last 168 hours.  ABG    Component Value Date/Time   PHART 7.339 (L) 10/17/2019 1231   PCO2ART 36.7 10/17/2019 1231   PO2ART 117 (H) 10/17/2019 1231   HCO3 19.6 (L) 10/17/2019 1231   TCO2 21 (L) 10/17/2019 1231   ACIDBASEDEF 5.0 (H) 10/17/2019 1231   O2SAT 98.0 10/17/2019 1231     Coagulation Profile: Recent Labs  Lab 10/22/19 0909  INR 1.2    Cardiac Enzymes: No  results for input(s): CKTOTAL, CKMB, CKMBINDEX, TROPONINI in the last 168 hours.  HbA1C: Hgb A1c MFr Bld  Date/Time Value Ref Range Status  09/04/2019 10:00 AM 7.2 (H) 4.8 - 5.6 % Final    Comment:    (NOTE) Pre diabetes:          5.7%-6.4%  Diabetes:              >6.4%  Glycemic control for   <7.0% adults with diabetes     CBG: Recent Labs  Lab 10/25/19 1123 10/25/19 1511 10/25/19 1935 10/26/19 0732 10/26/19 1113  GLUCAP 123* 123* 106* 103* 112*    This patient is critically ill with multiple organ system failure; which, requires frequent high complexity decision making, assessment, support, evaluation, and titration of therapies. This was completed through the application of advanced monitoring technologies and extensive interpretation of multiple databases. During this encounter critical care time was  devoted to patient care services described in this note for 32 minutes.  Margret Chance, MS4 [facility] 10/26/19 12:35 PM   Critical care attending attestation note:  Patient seen and examined and relevant ancillary tests reviewed.  I agree with the assessment and plan of care as outlined by Rubie Maid, MS IV. Th  Synopsis of assessment and plan:  59 year old man with past medical history of prior left MCA stroke with residual aphasia and some right hemiparesis.  Has been living semiindependently.  Presented with seizure activity and was diagnosed with status epilepticus which was difficult to control.  He remains critically ill due to acute hypoxic respiratory failure due to deconditioning from prolonged critical illness.  On examination he is awake and alert.  Follows commands with good strength.  Tracheostomy is in place with an intact site.  Heart sounds unremarkable.  Abdomen soft and nontender.  No peripheral edema.  No skin breakdown.  Patient tolerated SBT today.  Trach collar trial today. We will hold off on PEG tube placement per patient's family request.  Will attempt FEES tomorrow. Continue current anticonvulsants.  Place small bore feeding tube to ensure adequate nutrition.Marland Kitchen  CRITICAL CARE Performed by: Lynnell Catalan   Total critical care time: 35 minutes  Critical care time was exclusive of separately billable procedures and treating other patients.  Critical care was necessary to treat or prevent imminent or life-threatening deterioration.  Critical care was time spent personally by me on the following activities: development of treatment plan with patient and/or surrogate as well as nursing, discussions with consultants, evaluation of patient's response to treatment, examination of patient, obtaining history from patient or surrogate, ordering and performing treatments and interventions, ordering and review of laboratory studies, ordering and review of  radiographic studies, pulse oximetry, re-evaluation of patient's condition and participation in multidisciplinary rounds.  Lynnell Catalan, MD [facility] ICU Physician [facility] [place] [facility]  Pager: [phone] Mobile: [phone] After hours: [phone].  10/26/2019, 6:22 PM

## 2019-10-26 NOTE — Plan of Care (Deleted)
Rudean Hitt RN

## 2019-10-26 NOTE — Progress Notes (Signed)
Physical Therapy Treatment Patient Details Name: Corey Bennett MRN: [id number] DOB: Apr 10, 1960 Today's Date: 10/26/2019    History of Present Illness 59 y.o. 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  He was intubated for airway protection on 10/10/19. Pt extubated on 10/14/19.  on 8/14, pt had continued twitching of Rt UE with AMS, and decreased 02 sats.  He was intubated.  Diagnosis: status epilepticus in setting of prior large Lt MCA infarct; acute kidney injury, possible acute meningitis, HTN. PMH includes:  poorly controlled DM, dysphagia, HTN     PT Comments    Worked on standing in stedy and marching in place. Pt improving with ability to transfer however remains on vent. RT attempting to wean him to trach collar after PT session which is why we didn't transfer pt to chair to minimize strain on lungs. Pt eager for OOB and encouraged RN staff to use stedy to transfer pt OOB to chair later today after trach collar trial. Tresa Endo, RN agreed. Acute PT to cont to follow.    Follow Up Recommendations  SNF;Supervision/Assistance - 24 hour     Equipment Recommendations  Wheelchair (measurements PT);Wheelchair cushion (measurements PT)    Recommendations for Other Services       Precautions / Restrictions Precautions Precautions: Fall Precaution Comments: intubated via trach Restrictions Weight Bearing Restrictions: No    Mobility  Bed Mobility Overal bed mobility: Needs Assistance Bed Mobility: Supine to Sit;Sit to Supine     Supine to sit: Min assist;+2 for safety/equipment Sit to supine: Min assist;+2 for safety/equipment   General bed mobility comments: wiht max verbal and tactile cues pt able to complete transfer with minimal physical assist  Transfers Overall transfer level: Needs assistance Equipment used: Ambulation equipment used Transfers: Sit to/from Stand Sit to  Stand: Min assist;+2 physical assistance;+2 safety/equipment         General transfer comment: pt pulled self up with L UE, assist to block R knee and hold R hand on stedy, minA to power up, once up pt min A to maintain upright standing  Ambulation/Gait                 Stairs             Wheelchair Mobility    Modified Rankin (Stroke Patients Only)       Balance Overall balance assessment: Needs assistance Sitting-balance support: Feet supported Sitting balance-Leahy Scale: Poor Sitting balance - Comments: pt sitting EOB with min A for safety as pt impulsively trying to stand   Standing balance support: Bilateral upper extremity supported Standing balance-Leahy Scale: Poor Standing balance comment: pt dependent on UE support                            Cognition Arousal/Alertness: Awake/alert Behavior During Therapy: Flat affect Overall Cognitive Status: No family/caregiver present to determine baseline cognitive functioning                                 General Comments: pt follows commands but demonstrates delayed processing, impaired problem solving, impaired sequencing and perseverates on shaking head yes  even when answer is no      Exercises Other Exercises Other Exercises: marching in standing    General Comments General comments (skin integrity, edema,  etc.): VSS, pt with some swelling in bilat feet      Pertinent Vitals/Pain Pain Assessment: Faces Faces Pain Scale: Hurts little more Pain Location: rubbing L thigh with L UE Pain Intervention(s): Monitored during session    Home Living                      Prior Function            PT Goals (current goals can now be found in the care plan section) Progress towards PT goals: Progressing toward goals    Frequency    Min 3X/week      PT Plan Current plan remains appropriate    Co-evaluation              AM-PAC PT "6 Clicks" Mobility    Outcome Measure  Help needed turning from your back to your side while in a flat bed without using bedrails?: Total Help needed moving from lying on your back to sitting on the side of a flat bed without using bedrails?: Total Help needed moving to and from a bed to a chair (including a wheelchair)?: Total Help needed standing up from a chair using your arms (e.g., wheelchair or bedside chair)?: Total Help needed to walk in hospital room?: Total Help needed climbing 3-5 steps with a railing? : Total 6 Click Score: 6    End of Session Equipment Utilized During Treatment: Gait belt Activity Tolerance: Patient limited by fatigue Patient left: in bed;with call bell/phone within reach;with restraints reapplied Nurse Communication: Mobility status PT Visit Diagnosis: Other abnormalities of gait and mobility (R26.89);Other symptoms and signs involving the nervous system (R29.898);Muscle weakness (generalized) (M62.81)     Time: 6226-3335 PT Time Calculation (min) (ACUTE ONLY): 25 min  Charges:  $Therapeutic Activity: 8-22 mins $Neuromuscular Re-education: 8-22 mins                     Lewis Shock, PT, DPT Acute Rehabilitation Services Pager #: [phone] Office #: [phone]    Iona Hansen 10/26/2019, 1:53 PM

## 2019-10-27 LAB — COMPREHENSIVE METABOLIC PANEL
ALT: 45 U/L — ABNORMAL HIGH (ref 0–44)
AST: 36 U/L (ref 15–41)
Albumin: 1.9 g/dL — ABNORMAL LOW (ref 3.5–5.0)
Alkaline Phosphatase: 114 U/L (ref 38–126)
Anion gap: 12 (ref 5–15)
BUN: 9 mg/dL (ref 6–20)
CO2: 21 mmol/L — ABNORMAL LOW (ref 22–32)
Calcium: 7.6 mg/dL — ABNORMAL LOW (ref 8.9–10.3)
Chloride: 107 mmol/L (ref 98–111)
Creatinine, Ser: 1.1 mg/dL (ref 0.61–1.24)
GFR calc Af Amer: >60 mL/min (ref >60)
GFR calc non Af Amer: >60 mL/min (ref >60)
Glucose, Bld: 136 mg/dL — ABNORMAL HIGH (ref 70–99)
Potassium: 3 mmol/L — ABNORMAL LOW (ref 3.5–5.1)
Sodium: 140 mmol/L (ref 135–145)
Total Bilirubin: 0.4 mg/dL (ref 0.3–1.2)
Total Protein: 5.5 g/dL — ABNORMAL LOW (ref 6.5–8.1)

## 2019-10-27 LAB — CBC
HCT: 25.4 % — ABNORMAL LOW (ref 39.0–52.0)
Hemoglobin: 8.3 g/dL — ABNORMAL LOW (ref 13.0–17.0)
MCH: 29.5 pg (ref 26.0–34.0)
MCHC: 32.7 g/dL (ref 30.0–36.0)
MCV: 90.4 fL (ref 80.0–100.0)
Platelets: 460 10*3/uL — ABNORMAL HIGH (ref 150–400)
RBC: 2.81 MIL/uL — ABNORMAL LOW (ref 4.22–5.81)
RDW: 14.5 % (ref 11.5–15.5)
WBC: 9.2 10*3/uL (ref 4.0–10.5)
nRBC: 0 % (ref 0.0–0.2)

## 2019-10-27 LAB — GLUCOSE, CAPILLARY
Glucose-Capillary: 119 mg/dL — ABNORMAL HIGH (ref 70–99)
Glucose-Capillary: 135 mg/dL — ABNORMAL HIGH (ref 70–99)
Glucose-Capillary: 128 mg/dL — ABNORMAL HIGH (ref 70–99)
Glucose-Capillary: 189 mg/dL — ABNORMAL HIGH (ref 70–99)
Glucose-Capillary: 195 mg/dL — ABNORMAL HIGH (ref 70–99)

## 2019-10-27 LAB — POTASSIUM: Potassium: 3.7 mmol/L (ref 3.5–5.1)

## 2019-10-27 MED ORDER — PHENYTOIN 125 MG/5ML PO SUSP
100.0000 mg | Freq: Three times a day (TID) | ORAL | Status: DC
  Administered 2019-10-27 (×2): 100 mg via ORAL
  Filled 2019-10-27 (×3): qty 4

## 2019-10-27 MED ORDER — LEVETIRACETAM 100 MG/ML PO SOLN
1000.0000 mg | Freq: Two times a day (BID) | ORAL | Status: DC
  Administered 2019-10-27 – 2019-11-11 (×28): 1000 mg
  Filled 2019-10-27 (×29): qty 10

## 2019-10-27 MED ORDER — LACOSAMIDE 10 MG/ML PO SOLN: 150.0000 mg | Freq: Two times a day (BID) | ORAL | Status: DC

## 2019-10-27 MED ORDER — POTASSIUM CHLORIDE 20 MEQ/15ML (10%) PO SOLN
20.0000 meq | ORAL | Status: AC
  Administered 2019-10-27 (×2): 20 meq
  Filled 2019-10-27 (×2): qty 15

## 2019-10-27 MED ORDER — AMLODIPINE BESYLATE 10 MG PO TABS
10.0000 mg | ORAL_TABLET | Freq: Every day | ORAL | Status: DC
  Administered 2019-10-28 – 2019-11-21 (×24): 10 mg
  Filled 2019-10-27 (×25): qty 1

## 2019-10-27 MED ORDER — LACOSAMIDE 50 MG PO TABS
150.0000 mg | ORAL_TABLET | Freq: Two times a day (BID) | ORAL | Status: DC
  Administered 2019-10-27 – 2019-11-10 (×29): 150 mg
  Filled 2019-10-27 (×30): qty 3

## 2019-10-27 MED ORDER — POTASSIUM CHLORIDE 10 MEQ/50ML IV SOLN
10.0000 meq | INTRAVENOUS | Status: AC
  Administered 2019-10-27 (×4): 10 meq via INTRAVENOUS
  Filled 2019-10-27 (×4): qty 50

## 2019-10-27 NOTE — Progress Notes (Signed)
NAME:  Corey Bennett, MRN:  [id number], DOB:  10-19-1960, LOS: 16 ADMISSION DATE:  10/10/2019, CONSULTATION DATE:  10/17/2019 REFERRING MD:  Ella Jubilee, CHIEF COMPLAINT:  Seizure , possible aspiration   Brief History   Corey Bennett monitor is a 59 year old male with past medical history of 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 Patient is living in a assisted living facility and can ambulate by himself. Patient was found by staff to have a right sided twitching with right eye deviation. He was given Versed and Dilantin and was intubated for airway protection.  Patient was extubated on 8/11 and his seizures was controlled with Keppra, phenytoin, and Vimpat.  He was transferred to [address] for medical management  On 8/14, patient continued to have seizure with twitching of the right upper extremity.  2 mg of lorazepam was given.  Patient continued to desat in the 80s on 15 L of nonrebreather mask with altered mental status.  Patient was intubated to protect airway and transferred to the ICU.  Past Medical History  Left MCA stroke with residual aphasia and right hemiparesis CEA Hypertension Hypercholesterolemia Diabetes   Significant Hospital Events   8/8intubated in ED 8/11 extubation 8/14 reintubation 8/21 transfusion 1 U PRBc   Consults:  Neurology Speech Pathology   Procedures:  8/8 ETT 8/14 ETT 08/19 Tracheostomy  8/23 PEG tube  Significant Diagnostic Tests:  8/8 CT head>>No acute intracranial abnormality. Large area of encephalomalacia throughout the left MCA territory, consistent with remote infarct. 8/8 CXR>>lungs clear 8/8 UDS>>+ benzo (but given versed in ED); o/w negative 8/8 MRI of brain>>large territory chronic hemorrhagic infarct in the left frontal lobe.Ill-defined hyperintensity throughout the left basal ganglia and left thalamus with mild enhancement in  dobutamine likely secondary to encephalitis, infiltrating tumor or atypical subacute infarction 8/8 MRI of face and trigeminal region>>rules out soft tissue abscess 08/17 CXR>>improved aeration to the right lung base  8/17 CT Abdomen wo Contrast >>cholelithiasis/trace ascites/aorthic atherosclerosis   Micro Data:  8/8 Sars-CoV-2>>negative 8/8 BCx2>> no growth 8/8 UC>> Klebsiella pneumonia 08/14 BCx>> negative 08/14 Trach Cx>> gram positive rods and cocci in pairs 08/17 Trach Cx>> negative   Antimicrobials:  Vancomycin 8/8>> 8/12 Ceftriaxone 8/8>> 8/12 Ampicillin 8/8>>8/11 Zosyn 8/14 >>08/21 Vancomycin 08/14>>08/16  Interim history/subjective:   Patient seen this AM and remains awake and alert. He denies any pain this AM. He reports his lower abdominal pian from yesterday has since resolved. Pt endorses breathing is fine this morning. He continues to deny dysuria and endorses resolved dyschezia. His trach collar is in place.    Objective   Blood pressure (!) 150/69, pulse 81, temperature 99.5 F (37.5 C), temperature source Oral, resp. rate 17, height 6\' 1"  (1.854 m), weight 81.8 kg, SpO2 99 %.    Vent Mode: Stand-by FiO2 (%):  [30 %-40 %] 30 % Set Rate:  [16 bmp] 16 bmp Vt Set:  [630 mL] 630 mL PEEP:  [5 cmH20] 5 cmH20 Plateau Pressure:  [13 cmH20-16 cmH20] 16 cmH20   Intake/Output Summary (Last 24 hours) at 10/27/2019 0944 Last data filed at 10/27/2019 0800 Gross per 24 hour  Intake 1151.73 ml  Output 1330 ml  Net -178.27 ml   Filed Weights   10/22/19 0346 10/23/19 0500 10/24/19 0500  Weight: 78.9 kg 81.9 kg 81.8 kg    Examination: General: male, trach, vent dependent, NAD HENT: Trach collar in place, respiratory secretions  Lungs: BL vented breaths, CTAB,  breath sounds equal   Cardiovascular: RRR, s1 s2 no mrg  Abdomen: NABS, non tender, non distended  Extremities: trace edema in BLE, R sided weakness  Neuro: Alert and Oriented, following commands    Assessment & Plan:   1. Chronic respiratory failure with hypoxemia requiring tracheostomy tube placement and mechanical ventilatory support. Plan: -Trach collar placed 8/23, remains in place -Speech path to reevaluate swallow -Oral suction with tube care  PS trials as tolerated Ok to do short time limits with higher PS if needed   2. Status epilepticus in the setting of recent large left MCA stroke Plan: -No arm/eye twitching since ICU transfer -Continue current AED regimen/ Dilantin and Vimpat, transition to PO  3. Acute renal failure Resolved, Cr 1.10 this AM  -Continue to follow UOP and BMP  -Avoid nephrotoxic drugs   4. Hypertension BP improved this AM, 150/69 Avoid initiation of ARB in setting of AKI. - Restarted home Norovasc 5 mg on 8/23, transition all meds PO, SBP <160.  5. Urinary Retention - Foley in place. Pt denies dysuria - Continue bethanechol 15 mg, will reassess tomorrow     6. Diabetes type 2 with hyperglycemia  Plan: Holding long acting  cbg ssi  Plans for PEG today  7. Anemia, ICU related, multifactorial, frequent blood draws Plan: S/p 1 U pRBCs 8/21 No overt signs of bleeding  -Continue to observe H&H   8. Hypokalemia Plan: - K+ 3.0 today, repleted - Recheck K+ in PM  - Replete as needed/CTM   Best practice:  Diet: TF Pain/Anxiety/Delirium protocol (if indicated): off VAP protocol (if indicated): Yes DVT prophylaxis: enoxaparin GI prophylaxis: PPI Glucose control: SSI/ Lantus Mobility: BR Code Status: Full Family Communication: to be updated  Disposition: ICU  Labs   CBC: Recent Labs  Lab 10/24/19 1635 10/25/19 0507 10/25/19 1735 10/26/19 0500 10/27/19 0427  WBC 11.7* 9.3 9.3 8.4 9.2  HGB 7.1* 6.7* 8.1* 8.3* 8.3*  HCT 22.4* 20.9* 25.2* 25.6* 25.4*  MCV 91.8 92.5 92.0 90.8 90.4  PLT 430* 401* 415* 441* 460*    Basic Metabolic Panel: Recent Labs  Lab 10/22/19 0909 10/23/19 0524 10/25/19 0730 10/26/19 0500  10/27/19 0427  NA 141 139 141 139 140  K 3.4* 3.4* 3.4* 3.3* 3.0*  CL 110 109 108 106 107  CO2 23 22 22 22  21*  GLUCOSE 129* 123* 127* 117* 136*  BUN 24* 24* 15 11 9   CREATININE 1.53* 1.52* 1.44* 1.24 1.10  CALCIUM 8.0* 7.8* 8.1* 8.1* 7.6*   GFR: Estimated Creatinine Clearance: 81.7 mL/min (by C-G formula based on SCr of 1.1 mg/dL). Recent Labs  Lab 10/25/19 0507 10/25/19 1735 10/26/19 0500 10/27/19 0427  WBC 9.3 9.3 8.4 9.2    Liver Function Tests: Recent Labs  Lab 10/21/19 0835 10/25/19 0730 10/26/19 0500 10/27/19 0427  AST 41 47* 40 36  ALT 36 68* 58* 45*  ALKPHOS 123 133* 126 114  BILITOT 0.4 0.4 0.5 0.4  PROT 5.7* 5.9* 5.9* 5.5*  ALBUMIN 1.8* 2.0* 2.0* 1.9*   No results for input(s): LIPASE, AMYLASE in the last 168 hours. No results for input(s): AMMONIA in the last 168 hours.  ABG    Component Value Date/Time   PHART 7.339 (L) 10/17/2019 1231   PCO2ART 36.7 10/17/2019 1231   PO2ART 117 (H) 10/17/2019 1231   HCO3 19.6 (L) 10/17/2019 1231   TCO2 21 (L) 10/17/2019 1231   ACIDBASEDEF 5.0 (H) 10/17/2019 1231   O2SAT 98.0 10/17/2019 1231  Coagulation Profile: Recent Labs  Lab 10/22/19 0909  INR 1.2    Cardiac Enzymes: No results for input(s): CKTOTAL, CKMB, CKMBINDEX, TROPONINI in the last 168 hours.  HbA1C: Hgb A1c MFr Bld  Date/Time Value Ref Range Status  09/04/2019 10:00 AM 7.2 (H) 4.8 - 5.6 % Final    Comment:    (NOTE) Pre diabetes:          5.7%-6.4%  Diabetes:              >6.4%  Glycemic control for   <7.0% adults with diabetes     CBG: Recent Labs  Lab 10/26/19 1513 10/26/19 1920 10/26/19 2306 10/27/19 0306 10/27/19 0801  GLUCAP 130* 146* 130* 119* 135*    This patient is critically ill with multiple organ system failure; which, requires frequent high complexity decision making, assessment, support, evaluation, and titration of therapies. This was completed through the application of advanced monitoring technologies  and extensive interpretation of multiple databases. During this encounter critical care time was devoted to patient care services described in this note for 32 minutes.  Margret Chance, MS4 [facility] 10/27/19 9:44 AM   Critical care attending attestation note:  Patient seen and examined and relevant ancillary tests reviewed.  I agree with the assessment and plan of care as outlined by Rubie Maid, MS IV. Th  Synopsis of assessment and plan:  59 year old man with past medical history of prior left MCA stroke with residual aphasia and some right hemiparesis.  Has been living semiindependently.  Presented with seizure activity and was diagnosed with status epilepticus which was difficult to control.  He remains critically ill due to acute hypoxic respiratory failure due to deconditioning from prolonged critical illness.  On examination he is awake and alert.  Follows commands with good strength.  Tracheostomy is in place with an intact site.  Heart sounds unremarkable.  Abdomen soft and nontender.  No peripheral edema.  No skin breakdown.  Patient tolerated SBT today.  Trach collar trial today. We will hold off on PEG tube placement per patient's family request.  Will attempt FEES tomorrow. Continue current anticonvulsants.  Place small bore feeding tube to ensure adequate nutrition.Marland Kitchen  CRITICAL CARE Performed by: Margret Chance   Total critical care time: 35 minutes  Critical care time was exclusive of separately billable procedures and treating other patients.  Critical care was necessary to treat or prevent imminent or life-threatening deterioration.  Critical care was time spent personally by me on the following activities: development of treatment plan with patient and/or surrogate as well as nursing, discussions with consultants, evaluation of patient's response to treatment, examination of patient, obtaining history from patient or surrogate, ordering and performing  treatments and interventions, ordering and review of laboratory studies, ordering and review of radiographic studies, pulse oximetry, re-evaluation of patient's condition and participation in multidisciplinary rounds.  Lynnell Catalan, MD [facility] ICU Physician [facility]  Pager: [phone] Mobile: [phone] After hours: [phone].  10/27/2019, 9:44 AM

## 2019-10-27 NOTE — Progress Notes (Signed)
[facility] ADULT ICU REPLACEMENT PROTOCOL   The patient does apply for the [facility] Adult ICU Electrolyte Replacment Protocol based on the criteria listed below:   1. Is GFR >/= 30 ml/min? Yes.    Patient's GFR today is >60 2. Is SCr </= 2? Yes.   Patient's SCr is 1.10 ml/kg/hr 3. Did SCr increase >/= 0.5 in 24 hours? No. 4. Abnormal electrolyte(s): K+ 3.0 5. Ordered repletion with: Protocol 6. If a panic level lab has been reported, has the CCM MD in charge been notified? Yes.  .   Physician:  Carleene Mains 10/27/2019 5:35 AM

## 2019-10-27 NOTE — Evaluation (Signed)
Passy-Muir Speaking Valve - Evaluation Patient Details  Name: Corey Bennett MRN: [id number] Date of Birth: [birth year]  Today's Date: 10/27/2019 Time: 0932-1014 SLP Time Calculation (min) (ACUTE ONLY): 42 min  Past Medical History:  Past Medical History:  Diagnosis Date  . Carotid artery occlusion   . Diabetes (HCC)   . Hypercholesterolemia   . Hypertension   . 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 Stroke [name])    Past Surgical History:  Past Surgical History:  Procedure Laterality Date  . ENDARTERECTOMY Left 09/08/2019   Procedure: LEFT CAROTID ENDARTERECTOMY;  Surgeon: Chuck Hint, MD;  Location: [facility] OR;  Service: Vascular;  Laterality: Left;  . PATCH ANGIOPLASTY Left 09/08/2019   Procedure: PATCH ANGIOPLASTY USING XENOSURE BIOLOGIC PATCH 1x6cm;  Surgeon: Chuck Hint, MD;  Location: [facility] OR;  Service: Vascular;  Laterality: Left;  . TONSILLECTOMY     HPI:  Pt is a 59 yo male admitted from ALF with status epilepticus in the setting of recent large L MCA stroke, requiring intubation 8/8-8/11. BSE 8/12 recommended NPO with concern for reduced secretion management and pt was subsequently reintubated 8/14. [name] 8/19. Pt was evaluated by OP SLP 7/26 (after completing CIR in [place] and [facility] SLP), noting "receptive and expressive aphasia (likely severe) and some degree of verbal apraxia." He was awaiting a speech generating device. PMH: CVA with aphasia and hemiparesis, HTN, hypercholesterolemia, diabetes   Assessment / Plan / Recommendation Clinical Impression  Pt has a lot of secretions at baseline with expectoration of moderate amount of blood-tinged secretions upon cuff deflation. He wore PMV for over 20 minutes with stable VS and no over change in WOB. No back pressure was noted upon intermittent removal. He is able to achieve small amounts of phonation when repeating sounds back to SLP, and his voice sounds very soft and wet in quality. Pt does not cough or clear his  throat to command throughout this session but he did have occasional spontaneous coughing that was productive. Overall he seems to tolerate the valve well. Recommend that pt wear PMV throughout the day, but would use it when full supervision can be provided given level of secretions.   SLP Visit Diagnosis: Aphonia (R49.1)    SLP Assessment  Patient needs continued Speech Lanaguage Pathology Services    Follow Up Recommendations  Skilled Nursing facility    Frequency and Duration min 2x/week  2 weeks    PMSV Trial PMSV was placed for: over 20 min Able to redirect subglottic air through upper airway: Yes Able to Attain Phonation: Yes Voice Quality: Wet;Low vocal intensity Able to Expectorate Secretions: Yes Level of Secretion Expectoration with PMSV: Oral;Tracheal Breath Support for Phonation: Moderately decreased Intelligibility:  (difficult to assess with baseline aphasia/apraxia) Respirations During Trial: 22 SpO2 During Trial: 100 % Pulse During Trial: 81 Behavior: Alert   Tracheostomy Tube       Vent Dependency  Vent Mode: (S) Stand-by FiO2 (%): 30 %    Cuff Deflation Trial  GO Tolerated Cuff Deflation: Yes Length of Time for Cuff Deflation Trial: 30+ min Behavior: Alert        [name]., M.A. CCC-SLP Acute Rehabilitation Services Pager [phone] Office [phone]  10/27/2019, 10:20 AM

## 2019-10-27 NOTE — Progress Notes (Signed)
  Speech Language Pathology Treatment: Dysphagia  Patient Details Name: Corey Bennett MRN: [id number] DOB: May 21, 1960 Today's Date: 10/27/2019 Time: 1884-1660 SLP Time Calculation (min) (ACUTE ONLY): 42 min  Assessment / Plan / Recommendation Clinical Impression  Attempted to perform FEES, but even with RN assistance, pt did not tolerate passing of the scope. He was resistant and grabbing at the tube, pulling his head away from SLP despite attempts to stabilize the scope. Per RN, it was wuite challenging to place the Cortrak yesterday as well, requiring multiple staff members. After several repeated attempts, testing was deferred.   Clinical trials were offered instead with significant signs of dysphagia noted. Pt has oral holding of his saliva at baseline with anterior loss of saliva that spills passively down his chin and neck. He does not swallow saliva or boluses to command, and does not initiate spontaneous swallow with ice. After prolonged oral holding and Max cues to swallow, SLP offered a small amount of thin liquid to try to elicit a swifter swallow response. He did appear to swallow the water, but this was followed by explosive coughing. A significant amount of secretions was ejected orally with evidence of small amounts of green food coloring that had been in POs from attempted FEES. Upon removal of PMV, pt had further expectoration tracheally.   Given his reduced swallow initiation, reduced secretion management, and overt signs concerning for aspiration, would remain NPO. When pt is ready for additional swallow testing he may benefit from [facility] over FEES.    HPI HPI: Pt is a 59 yo male admitted from ALF with status epilepticus in the setting of recent large L MCA stroke, requiring intubation 8/8-8/11. BSE 8/12 recommended NPO with concern for reduced secretion management and pt was subsequently reintubated 8/14. Corey Bennett 8/19. Pt was evaluated by OP SLP 7/26 (after completing CIR in [place] and [facility]  SLP), noting "receptive and expressive aphasia (likely severe) and some degree of verbal apraxia." He was awaiting a speech generating device. PMH: CVA with aphasia and hemiparesis, HTN, hypercholesterolemia, diabetes      SLP Plan  Continue with current plan of care  Patient needs continued Speech Lanaguage Pathology Services    Recommendations  Diet recommendations: NPO Medication Administration: Via alternative means      Patient may use Passy-Muir Speech Valve: Intermittently with supervision PMSV Supervision: Full         Oral Care Recommendations: Oral care QID Follow up Recommendations: Skilled Nursing facility SLP Visit Diagnosis: Dysphagia, oropharyngeal phase (R13.12) Plan: Continue with current plan of care       GO                Corey Bennett., M.A. CCC-SLP Acute Rehabilitation Services Pager [phone] Office [phone]  10/27/2019, 10:26 AM

## 2019-10-27 NOTE — Progress Notes (Signed)
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 Patient is living in a assisted living facility and can ambulate by himself. Patient was found by staff to have a right sided twitching with right eye deviation. He was given Versed and Dilantin and was intubated for airway protection.  Patient was extubated on 8/11 and his seizures was controlled with Keppra, phenytoin, and Vimpat.  He was transferred to [facility] [address] for medical management  On 8/14, patient continued to have seizure with twitching of the right upper extremity.  2 mg of lorazepam was given.  Patient continued to desat in the 80s on 15 L of nonrebreather mask with altered mental status.  Patient was intubated to protect airway and transferred to the ICU.  Past Medical History  Left MCA stroke with residual aphasia and right hemiparesis CEA Hypertension Hypercholesterolemia Diabetes   Significant Hospital Events   8/8intubated in ED 8/11 extubation 8/14 reintubation 8/21 transfusion 1 U PRBc   Consults:  Neurology Speech Pathology   Procedures:  8/8 ETT 8/14 ETT 08/19 Tracheostomy  8/23 PEG tube  Significant Diagnostic Tests:  8/8 CT head>>No acute intracranial abnormality. Large area of encephalomalacia throughout the left MCA territory, consistent with remote infarct. 8/8 CXR>>lungs clear 8/8 UDS>>+ benzo (but given versed in ED); o/w negative 8/8 MRI of brain>>large territory chronic hemorrhagic infarct in the left frontal lobe.Ill-defined hyperintensity throughout the left basal ganglia and left thalamus with mild enhancement in  dobutamine likely secondary to encephalitis, infiltrating tumor or atypical subacute infarction 8/8 MRI of face and trigeminal region>>rules out soft tissue abscess 08/17 CXR>>improved aeration to the right lung base  8/17 CT Abdomen wo Contrast >>cholelithiasis/trace ascites/aorthic atherosclerosis   Micro Data:  8/8 Sars-CoV-2>>negative 8/8 BCx2>> no growth 8/8 UC>> Klebsiella pneumonia 08/14 BCx>> negative 08/14 Trach Cx>> gram positive rods and cocci in pairs 08/17 Trach Cx>> negative   Antimicrobials:  Vancomycin 8/8>> 8/12 Ceftriaxone 8/8>> 8/12 Ampicillin 8/8>>8/11 Zosyn 8/14 >>08/21 Vancomycin 08/14>>08/16  Interim history/subjective:   Patient seen this AM and remains awake and alert. He denies any pain this AM. He reports his lower abdominal pian from yesterday has since resolved. Pt endorses breathing is fine this morning. He continues to deny dysuria and endorses resolved dyschezia. His trach collar is in place.   Objective   Blood pressure (!) 150/65, pulse 80, temperature 98.7 F (37.1 C), temperature source Oral, resp. rate (!) 7, height 6\' 1"  (1.854 m), weight 81.8 kg, SpO2 100 %.    Vent Mode: Stand-by FiO2 (%):  [28 %-40 %] 28 % Set Rate:  [16 bmp] 16 bmp Vt Set:  [630 mL] 630 mL PEEP:  [5 cmH20] 5 cmH20 Plateau Pressure:  [13 cmH20-16 cmH20] 16 cmH20   Intake/Output Summary (Last 24 hours) at 10/27/2019 1816 Last data filed at 10/27/2019 1800 Gross per 24 hour  Intake 1819.97 ml  Output 1725 ml  Net 94.97 ml   Filed Weights   10/22/19 0346 10/23/19 0500 10/24/19 0500  Weight: 78.9 kg 81.9 kg 81.8 kg    Examination: General: male, trach, vent dependent, NAD HENT: Trach collar in place, respiratory secretions  Lungs: BL vented breaths, CTAB,  breath sounds equal   Cardiovascular: RRR, s1 s2 no mrg  Abdomen: NABS, non tender, non distended  Extremities: trace edema in BLE, R sided weakness  Neuro: Alert and Oriented, following commands    Assessment & Plan:   1. Chronic respiratory failure with hypoxemia requiring tracheostomy tube placement and mechanical ventilatory support. Plan: -Trach collar placed 8/23, remains in place -Speech path to reevaluate swallow -Oral suction with tube care  PS trials as tolerated Ok to do short time limits with higher PS if needed   2. Status epilepticus in the setting of recent large left MCA stroke Plan: -No arm/eye twitching since ICU transfer -Continue current AED regimen/ Dilantin and Vimpat, transition to PO  3. Acute renal failure Resolved, Cr 1.10 this AM  -Continue to follow UOP and BMP  -Avoid nephrotoxic drugs   4. Hypertension BP improved this AM, 150/69 Avoid initiation of ARB in setting of AKI. - Restarted home Norovasc 5 mg on 8/23, transition all meds PO, SBP <160.  5. Urinary Retention - Foley in place. Pt denies dysuria - Continue bethanechol 15 mg, will reassess tomorrow     6. Diabetes type 2 with hyperglycemia  Plan: Holding long acting  cbg ssi  Plans for PEG today  7. Anemia, ICU related, multifactorial, frequent blood draws Plan: S/p 1 U pRBCs 8/21 No overt signs of bleeding  -Continue to observe H&H   8. Hypokalemia Plan: - K+ 3.0 today, repleted - Recheck K+ in PM  - Replete as needed/CTM   Best practice:  Diet: TF Pain/Anxiety/Delirium protocol (if indicated): off VAP protocol (if indicated): Yes DVT prophylaxis: enoxaparin GI prophylaxis: PPI Glucose control: SSI/ Lantus Mobility: BR Code Status: Full Family Communication: to be updated  Disposition: ICU  Labs   CBC: Recent Labs  Lab 10/24/19 1635 10/25/19 0507 10/25/19 1735 10/26/19 0500 10/27/19 0427  WBC 11.7* 9.3 9.3 8.4 9.2  HGB 7.1* 6.7* 8.1* 8.3* 8.3*  HCT 22.4* 20.9* 25.2* 25.6* 25.4*  MCV 91.8 92.5 92.0 90.8 90.4  PLT 430* 401* 415* 441* 460*    Basic Metabolic Panel: Recent Labs  Lab 10/22/19 0909 10/22/19 0909 10/23/19 0524 10/25/19 0730  10/26/19 0500 10/27/19 0427 10/27/19 1422  NA 141  --  139 141 139 140  --   K 3.4*   < > 3.4* 3.4* 3.3* 3.0* 3.7  CL 110  --  109 108 106 107  --   CO2 23  --  22 22 22  21*  --   GLUCOSE 129*  --  123* 127* 117* 136*  --   BUN 24*  --  24* 15 11 9   --   CREATININE 1.53*  --  1.52* 1.44* 1.24 1.10  --   CALCIUM 8.0*  --  7.8* 8.1* 8.1* 7.6*  --    < > = values in this interval not displayed.   GFR: Estimated Creatinine Clearance: 81.7 mL/min (by C-G formula based on SCr of 1.1 mg/dL). Recent Labs  Lab 10/25/19 0507 10/25/19 1735 10/26/19 0500 10/27/19 0427  WBC 9.3 9.3 8.4 9.2    Liver Function Tests: Recent Labs  Lab 10/21/19 0835 10/25/19 0730 10/26/19 0500 10/27/19 0427  AST 41 47* 40 36  ALT 36 68* 58* 45*  ALKPHOS 123 133* 126 114  BILITOT 0.4 0.4 0.5 0.4  PROT 5.7* 5.9* 5.9* 5.5*  ALBUMIN 1.8* 2.0* 2.0* 1.9*   No results for input(s): LIPASE, AMYLASE in the last 168 hours. No results for input(s): AMMONIA  in the last 168 hours.  ABG    Component Value Date/Time   PHART 7.339 (L) 10/17/2019 1231   PCO2ART 36.7 10/17/2019 1231   PO2ART 117 (H) 10/17/2019 1231   HCO3 19.6 (L) 10/17/2019 1231   TCO2 21 (L) 10/17/2019 1231   ACIDBASEDEF 5.0 (H) 10/17/2019 1231   O2SAT 98.0 10/17/2019 1231     Coagulation Profile: Recent Labs  Lab 10/22/19 0909  INR 1.2    Cardiac Enzymes: No results for input(s): CKTOTAL, CKMB, CKMBINDEX, TROPONINI in the last 168 hours.  HbA1C: Hgb A1c MFr Bld  Date/Time Value Ref Range Status  09/04/2019 10:00 AM 7.2 (H) 4.8 - 5.6 % Final    Comment:    (NOTE) Pre diabetes:          5.7%-6.4%  Diabetes:              >6.4%  Glycemic control for   <7.0% adults with diabetes     CBG: Recent Labs  Lab 10/26/19 1920 10/26/19 2306 10/27/19 0306 10/27/19 0801 10/27/19 1115  GLUCAP 146* 130* 119* 135* 128*    This patient is critically ill with multiple organ system failure; which, requires frequent high complexity  decision making, assessment, support, evaluation, and titration of therapies. This was completed through the application of advanced monitoring technologies and extensive interpretation of multiple databases. During this encounter critical care time was devoted to patient care services described in this note for 32 minutes.  Margret Chance, MS4 [facility] 10/27/19 6:16 PM   Critical care attending attestation note:  Patient seen and examined and relevant ancillary tests reviewed.  I agree with the assessment and plan of care as outlined by Rubie Maid, MS IV. Th  Synopsis of assessment and plan:  59 year old man with past medical history of prior left MCA stroke with residual aphasia and some right hemiparesis.  Has been living semiindependently.  Presented with seizure activity and was diagnosed with status epilepticus which was difficult to control.  He remains critically ill due to acute hypoxic respiratory failure due to deconditioning from prolonged critical illness.  On examination he is awake and alert.  Follows commands with good strength.  Tracheostomy is in place with an intact site.  Heart sounds unremarkable.  Abdomen soft and nontender.  No peripheral edema.  No skin breakdown.  Tolerating trach collar. SLP evaluation shows severe dysphagia.   Continue open-ended trach collar trial. Continue tube feeds and SLP, Transfer to PCU tomorrow if remains on trach collar.    Lynnell Catalan, MD [facility] ICU Physician [facility]  Pager: [phone] Mobile: [phone] After hours: [phone].  10/27/2019, 6:16 PM

## 2019-10-27 NOTE — Progress Notes (Signed)
Physical Therapy Treatment Patient Details Name: Corey Bennett MRN: [id number] DOB: 01/07/1961 Today's Date: 10/27/2019    History of Present Illness 59 y.o. 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  He was intubated for airway protection on 10/10/19. Pt extubated on 10/14/19.  on 8/14, pt had continued twitching of Rt UE with AMS, and decreased 02 sats.  He was intubated.  Diagnosis: status epilepticus in setting of prior large Lt MCA infarct; acute kidney injury, possible acute meningitis.  He underwent tracheostomy on 8/19/2.  PMH includes: poorly controlled DM, dysphagia, HTN    PT Comments    Patient progressing this session to pre-gait and gait activities with +2 A using LUE support with R LE able to sustain weight for L stepping.  He fatigues quickly, but seems eager to mobilize.  Feel should be able to utilize cane vs hemiwalker next session to attempt further ambulation.  PT to continue to follow acutely.  Remains appropriate for SNF rehab at d/c.    Follow Up Recommendations  SNF;Supervision/Assistance - 24 hour     Equipment Recommendations  Wheelchair (measurements PT);Wheelchair cushion (measurements PT)    Recommendations for Other Services       Precautions / Restrictions Precautions Precautions: Fall    Mobility  Bed Mobility Overal bed mobility: Needs Assistance Bed Mobility: Rolling;Sidelying to Sit Rolling: Min assist Sidelying to sit: Mod assist       General bed mobility comments: increased time from L sidelying to get legs off bed, then assist for managing trunk upright  Transfers Overall transfer level: Needs assistance   Transfers: Squat Pivot Transfers;Sit to/from Stand Sit to Stand: Min assist;+2 safety/equipment   Squat pivot transfers: Mod assist     General transfer comment: initially scooting to R using L UE to assist then lifted hips and  assist with min/mod A for safety to pivot to recliner with drop arm; mulitple sit to stands from recliner with +2 A for safety, pt pushing up with L UE and assist for anterior weight shift  Ambulation/Gait Ambulation/Gait assistance: Mod assist;+2 safety/equipment Gait Distance (Feet): 3 Feet Assistive device: 1 person hand held assist Gait Pattern/deviations: Step-to pattern;Decreased stride length;Decreased dorsiflexion - right;Trunk flexed     General Gait Details: initially with chair back in front for L UE support and PT on R side pt able to weight shift, then take steps with L forward and back, then with R, then able to perform two bouts about 3' with PT on L side for UE support with mod A for controlling trunk forward and back   Stairs             Wheelchair Mobility    Modified Rankin (Stroke Patients Only)       Balance Overall balance assessment: Needs assistance Sitting-balance support: Feet supported Sitting balance-Leahy Scale: Poor Sitting balance - Comments: S for safety sitting EOB due to impulsivity   Standing balance support: Single extremity supported Standing balance-Leahy Scale: Poor                              Cognition Arousal/Alertness: Awake/alert Behavior During Therapy: Flat affect Overall Cognitive Status: No family/caregiver present to determine baseline cognitive functioning  General Comments: needs multiple cues/redirections to scoot out to EOB kept reaching to grab therapists arm to pull and scoot      Exercises      General Comments General comments (skin integrity, edema, etc.): VSS throughout      Pertinent Vitals/Pain Pain Assessment: Faces Faces Pain Scale: Hurts little more Pain Location: grimacing when moving R hand/wrist Pain Descriptors / Indicators: Grimacing Pain Intervention(s): Monitored during session;Repositioned;Limited activity within patient's tolerance     Home Living                      Prior Function            PT Goals (current goals can now be found in the care plan section) Progress towards PT goals: Progressing toward goals    Frequency    Min 3X/week      PT Plan Current plan remains appropriate    Co-evaluation              AM-PAC PT "6 Clicks" Mobility   Outcome Measure  Help needed turning from your back to your side while in a flat bed without using bedrails?: A Little Help needed moving from lying on your back to sitting on the side of a flat bed without using bedrails?: A Lot Help needed moving to and from a bed to a chair (including a wheelchair)?: A Lot Help needed standing up from a chair using your arms (e.g., wheelchair or bedside chair)?: A Lot Help needed to walk in hospital room?: Total Help needed climbing 3-5 steps with a railing? : Total 6 Click Score: 11    End of Session Equipment Utilized During Treatment: Gait belt Activity Tolerance: Patient tolerated treatment well Patient left: in chair;with call bell/phone within reach;with chair alarm set Nurse Communication: Mobility status PT Visit Diagnosis: Other abnormalities of gait and mobility (R26.89);Other symptoms and signs involving the nervous system (R29.898);Muscle weakness (generalized) (M62.81)     Time: 3329-5188 PT Time Calculation (min) (ACUTE ONLY): 24 min  Charges:  $Gait Training: 8-22 mins $Therapeutic Activity: 8-22 mins                     Sheran Lawless, PT Acute Rehabilitation Services [phone] Office:[phone] 10/27/2019    Corey Bennett 10/27/2019, 4:23 PM

## 2019-10-27 NOTE — Progress Notes (Signed)
MEDICATION RELATED CONSULT NOTE - FOLLOW UP   Pharmacy Consult for Phenytoin Indication: Seizures   No Known Allergies  Patient Measurements: Height: 6\' 1"  (185.4 cm) Weight: 81.8 kg (180 lb 5.4 oz) IBW/kg (Calculated) : 79.9   Vital Signs: Temp: 99.5 F (37.5 C) (08/24 0800) Temp Source: Oral (08/24 0800) BP: 150/69 (08/24 0800) Pulse Rate: 81 (08/24 0800) Intake/Output from previous day: 08/23 0701 - 08/24 0700 In: 1059.9 [NG/GT:630; IV Piggyback:429.9] Out: 1480 [Urine:1480] Intake/Output from this shift: Total I/O In: 101.8 [I.V.:10; NG/GT:70; IV Piggyback:21.8] Out: -   Labs: Recent Labs    10/25/19 0507 10/25/19 0730 10/25/19 1735 10/26/19 0500 10/27/19 0427  WBC   < >  --  9.3 8.4 9.2  HGB   < >  --  8.1* 8.3* 8.3*  HCT   < >  --  25.2* 25.6* 25.4*  PLT   < >  --  415* 441* 460*  CREATININE  --  1.44*  --  1.24 1.10  ALBUMIN  --  2.0*  --  2.0* 1.9*  PROT  --  5.9*  --  5.9* 5.5*  AST  --  47*  --  40 36  ALT  --  68*  --  58* 45*  ALKPHOS  --  133*  --  126 114  BILITOT  --  0.4  --  0.5 0.4   < > = values in this interval not displayed.   Estimated Creatinine Clearance: 81.7 mL/min (by C-G formula based on SCr of 1.1 mg/dL).    Assessment:  Last seizure noted 8/14  - 8/7 loaded with DPH (goal 15-20) and Keppra - 8/9 Keppra dose reduced d/t renal fxn 750mg  IV q12h > 8/13 incr back to 1000mg  q12h  8/7 DPH 1500mg  IV x1 then 100mg  Q8H (~4 mg/kg/d based on TBW; IBW 80 kg) 8/8 DPH level (post-load): 20.1 >> no adjustments, cont current dosing 8/11 DPH 14.1, albumin 4.1 8/12 DPH 13.4.  Vimpat and Onfi added. 8/13 DPH 12.6, Albumin 2.5, corrected 21. Vimpat and Keppra increased d/t improved CrCl 8/15 DPH 7.5 > corrected = 12.5, free level ~0.95 >> 150mg  x1 then resume 100 Q8hr, renal fx worsening 8/17 DPH 4.3 (Corrects to 9.35 based on albumin from 8/18) - continue current dose per MD  8/18: Phenytoin level 3 adjusts to 8.4  Goal of Therapy:   Phenytoin level 15-20 Seizure control  Plan:  -Will continue phenytoin at current dose. Will plan to switch meds to enteral route once PEG inserted.  -Next level at steady state after IV to per tube conversion.    9/13, PharmD., BCPS, BCCCP Clinical Pharmacist Clinical phone for 10/27/19 until 3:30pm: [phone] If after 3:30pm, please refer to [facility] for unit-specific pharmacist

## 2019-10-28 LAB — COMPREHENSIVE METABOLIC PANEL
ALT: 45 U/L — ABNORMAL HIGH (ref 0–44)
AST: 39 U/L (ref 15–41)
Albumin: 2.2 g/dL — ABNORMAL LOW (ref 3.5–5.0)
Alkaline Phosphatase: 134 U/L — ABNORMAL HIGH (ref 38–126)
Anion gap: 11 (ref 5–15)
BUN: 13 mg/dL (ref 6–20)
CO2: 23 mmol/L (ref 22–32)
Calcium: 8.2 mg/dL — ABNORMAL LOW (ref 8.9–10.3)
Chloride: 104 mmol/L (ref 98–111)
Creatinine, Ser: 1.09 mg/dL (ref 0.61–1.24)
GFR calc Af Amer: >60 mL/min (ref >60)
GFR calc non Af Amer: >60 mL/min (ref >60)
Glucose, Bld: 206 mg/dL — ABNORMAL HIGH (ref 70–99)
Potassium: 3.6 mmol/L (ref 3.5–5.1)
Sodium: 138 mmol/L (ref 135–145)
Total Bilirubin: 0.3 mg/dL (ref 0.3–1.2)
Total Protein: 6.1 g/dL — ABNORMAL LOW (ref 6.5–8.1)

## 2019-10-28 LAB — CBC
HCT: 26.7 % — ABNORMAL LOW (ref 39.0–52.0)
Hemoglobin: 8.8 g/dL — ABNORMAL LOW (ref 13.0–17.0)
MCH: 29.6 pg (ref 26.0–34.0)
MCHC: 33 g/dL (ref 30.0–36.0)
MCV: 89.9 fL (ref 80.0–100.0)
Platelets: 495 10*3/uL — ABNORMAL HIGH (ref 150–400)
RBC: 2.97 MIL/uL — ABNORMAL LOW (ref 4.22–5.81)
RDW: 14.8 % (ref 11.5–15.5)
WBC: 10 10*3/uL (ref 4.0–10.5)
nRBC: 0 % (ref 0.0–0.2)

## 2019-10-28 LAB — GLUCOSE, CAPILLARY
Glucose-Capillary: 166 mg/dL — ABNORMAL HIGH (ref 70–99)
Glucose-Capillary: 167 mg/dL — ABNORMAL HIGH (ref 70–99)
Glucose-Capillary: 168 mg/dL — ABNORMAL HIGH (ref 70–99)
Glucose-Capillary: 189 mg/dL — ABNORMAL HIGH (ref 70–99)
Glucose-Capillary: 195 mg/dL — ABNORMAL HIGH (ref 70–99)
Glucose-Capillary: 223 mg/dL — ABNORMAL HIGH (ref 70–99)

## 2019-10-28 LAB — MAGNESIUM: Magnesium: 1.9 mg/dL (ref 1.7–2.4)

## 2019-10-28 MED ORDER — PROSOURCE TF PO LIQD
45.0000 mL | Freq: Two times a day (BID) | ORAL | Status: DC
  Administered 2019-10-28 – 2019-11-10 (×28): 45 mL
  Filled 2019-10-28 (×32): qty 45

## 2019-10-28 MED ORDER — OSMOLITE 1.5 CAL PO LIQD
1000.0000 mL | ORAL | Status: DC
  Administered 2019-10-28 – 2019-11-09 (×14): 1000 mL
  Filled 2019-10-28 (×21): qty 1000

## 2019-10-28 MED ORDER — SENNOSIDES 8.8 MG/5ML PO SYRP
5.0000 mL | ORAL_SOLUTION | Freq: Two times a day (BID) | ORAL | Status: DC
  Administered 2019-10-29 – 2019-11-21 (×39): 5 mL
  Filled 2019-10-28 (×49): qty 5

## 2019-10-28 MED ORDER — ORAL CARE MOUTH RINSE
15.0000 mL | Freq: Two times a day (BID) | OROMUCOSAL | Status: DC
  Administered 2019-10-28 – 2019-11-21 (×41): 15 mL via OROMUCOSAL

## 2019-10-28 MED ORDER — POLYETHYLENE GLYCOL 3350 17 G PO PACK
17.0000 g | PACK | Freq: Every day | ORAL | Status: DC | PRN
  Administered 2019-11-14 – 2019-11-21 (×2): 17 g
  Filled 2019-10-28 (×2): qty 1

## 2019-10-28 MED ORDER — PHENYTOIN 125 MG/5ML PO SUSP
100.0000 mg | Freq: Three times a day (TID) | ORAL | Status: DC
  Administered 2019-10-28 – 2019-11-01 (×15): 100 mg
  Filled 2019-10-28 (×17): qty 4

## 2019-10-28 NOTE — Progress Notes (Signed)
Patient arrived to unit. Alert, vital signs stable. Telemetry initiated. Bed left in lowest position, alarm on, call bell within reach.  Melony Overly, RN

## 2019-10-28 NOTE — Progress Notes (Signed)
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 Patient is living in a assisted living facility and can ambulate by himself. Patient was found by staff to have a right sided twitching with right eye deviation. He was given Versed and Dilantin and was intubated for airway protection.  Patient was extubated on 8/11 and his seizures was controlled with Keppra, phenytoin, and Vimpat.  He was transferred to [address] for medical management  On 8/14, patient continued to have seizure with twitching of the right upper extremity.  2 mg of lorazepam was given.  Patient continued to desat in the 80s on 15 L of nonrebreather mask with altered mental status.  Patient was intubated to protect airway and transferred to the ICU.  Past Medical History  Left MCA stroke with residual aphasia and right hemiparesis CEA Hypertension Hypercholesterolemia Diabetes   Significant Hospital Events   8/8intubated in ED 8/11 extubation 8/14 reintubation 8/21 transfusion 1 U PRBc   Consults:  Neurology Speech Pathology   Procedures:  8/8 ETT 8/14 ETT 08/19 Tracheostomy  8/23 PEG tube  Significant Diagnostic Tests:  8/8 CT head>>No acute intracranial abnormality. Large area of encephalomalacia throughout the left MCA territory, consistent with remote infarct. 8/8 CXR>>lungs clear 8/8 UDS>>+ benzo (but given versed in ED); o/w negative 8/8 MRI of brain>>large territory chronic hemorrhagic infarct in the left frontal lobe.Ill-defined hyperintensity throughout the left basal ganglia and left thalamus with mild enhancement in  dobutamine likely secondary to encephalitis, infiltrating tumor or atypical subacute infarction 8/8 MRI of face and trigeminal region>>rules out soft tissue abscess 08/17 CXR>>improved aeration to the right lung base  8/17 CT Abdomen wo Contrast >>cholelithiasis/trace ascites/aorthic atherosclerosis   Micro Data:  8/8 Sars-CoV-2>>negative 8/8 BCx2>> no growth 8/8 UC>> Klebsiella pneumonia 08/14 BCx>> negative 08/14 Trach Cx>> gram positive rods and cocci in pairs 08/17 Trach Cx>> negative   Antimicrobials:  Vancomycin 8/8>> 8/12 Ceftriaxone 8/8>> 8/12 Ampicillin 8/8>>8/11 Zosyn 8/14 >>08/21 Vancomycin 08/14>>08/16  Interim history/subjective:   Patient seen this AM and remains awake and alert. He denies any pain this AM. He reports his lower abdominal pian from yesterday has since resolved. Pt endorses breathing is fine this morning. He continues to deny dysuria and endorses resolved dyschezia. His trach collar is in place.   Objective   Blood pressure (!) 143/69, pulse 78, temperature 98.5 F (36.9 C), resp. rate 16, height 6\' 1"  (1.854 m), weight 73.2 kg, SpO2 100 %.    Vent Mode: PRVC FiO2 (%):  [28 %-30 %] 30 % Set Rate:  [16 bmp] 16 bmp Vt Set:  [630 mL] 630 mL PEEP:  [5 cmH20] 5 cmH20 Plateau Pressure:  [15 cmH20] 15 cmH20   Intake/Output Summary (Last 24 hours) at 10/28/2019 0838 Last data filed at 10/28/2019 0700 Gross per 24 hour  Intake 1978 ml  Output 1515 ml  Net 463 ml   Filed Weights   10/23/19 0500 10/24/19 0500 10/28/19 0500  Weight: 81.9 kg 81.8 kg 73.2 kg    Examination: General: male, trach, vent dependent, NAD HENT: Trach collar in place, respiratory secretions  Lungs: BL vented breaths, CTAB, breath sounds equal  Cardiovascular: RRR, s1 s2 no mrg  Abdomen: NABS, non tender, non distended  Extremities: trace edema in BLE, R sided weakness  Neuro: Alert and Oriented, following commands   Assessment & Plan:   1. Chronic respiratory  failure with hypoxemia requiring tracheostomy tube placement and mechanical ventilatory support. -Trach collar placed 8/23, remains in place -Speech path to reevaluate swallow -Oral suction with tube care  - Ready for transfer to PCU  2. Status epilepticus in the setting of recent large left MCA stroke -No arm/eye twitching since ICU transfer -Continue current AED regimen/ Dilantin and Vimpat, transition to PO  3. Hypertension BP improved this AM, 150/69 Avoid initiation of ARB in setting of AKI. - Restarted home Norovasc 5 mg on 8/23, transition all meds PO, SBP <160.  4. Urinary Retention - Continue Benechol - Remove Foley     6. Diabetes type 2 with hyperglycemia  - Continue current regimen  7. Dysphagia.  - SLP following. Deferred PEG at patient's request.  - Continue tube feeds - Trial of swallowing rehabilitation prior to PEG tube.   Best practice:  Diet: TF Pain/Anxiety/Delirium protocol (if indicated): off VAP protocol (if indicated): Yes DVT prophylaxis: enoxaparin GI prophylaxis: PPI Glucose control: SSI/ Lantus Mobility: BR Code Status: Full Family Communication: to be updated  Disposition: PCU, orders reconciled and TRH contacted.  Labs   CBC: Recent Labs  Lab 10/24/19 1635 10/25/19 0507 10/25/19 1735 10/26/19 0500 10/27/19 0427  WBC 11.7* 9.3 9.3 8.4 9.2  HGB 7.1* 6.7* 8.1* 8.3* 8.3*  HCT 22.4* 20.9* 25.2* 25.6* 25.4*  MCV 91.8 92.5 92.0 90.8 90.4  PLT 430* 401* 415* 441* 460*    Basic Metabolic Panel: Recent Labs  Lab 10/22/19 0909 10/22/19 0909 10/23/19 0524 10/25/19 0730 10/26/19 0500 10/27/19 0427 10/27/19 1422  NA 141  --  139 141 139 140  --   K 3.4*   < > 3.4* 3.4* 3.3* 3.0* 3.7  CL 110  --  109 108 106 107  --   CO2 23  --  22 22 22  21*  --   GLUCOSE 129*  --  123* 127* 117* 136*  --   BUN 24*  --  24* 15 11 9   --   CREATININE 1.53*  --  1.52* 1.44* 1.24 1.10  --   CALCIUM 8.0*  --  7.8* 8.1* 8.1* 7.6*  --    < > = values  in this interval not displayed.   GFR: Estimated Creatinine Clearance: 74.9 mL/min (by C-G formula based on SCr of 1.1 mg/dL). Recent Labs  Lab 10/25/19 0507 10/25/19 1735 10/26/19 0500 10/27/19 0427  WBC 9.3 9.3 8.4 9.2    Liver Function Tests: Recent Labs  Lab 10/25/19 0730 10/26/19 0500 10/27/19 0427  AST 47* 40 36  ALT 68* 58* 45*  ALKPHOS 133* 126 114  BILITOT 0.4 0.5 0.4  PROT 5.9* 5.9* 5.5*  ALBUMIN 2.0* 2.0* 1.9*   No results for input(s): LIPASE, AMYLASE in the last 168 hours. No results for input(s): AMMONIA in the last 168 hours.  ABG    Component Value Date/Time   PHART 7.339 (L) 10/17/2019 1231   PCO2ART 36.7 10/17/2019 1231   PO2ART 117 (H) 10/17/2019 1231   HCO3 19.6 (L) 10/17/2019 1231   TCO2 21 (L) 10/17/2019 1231   ACIDBASEDEF 5.0 (H) 10/17/2019 1231   O2SAT 98.0 10/17/2019 1231     Coagulation Profile: Recent Labs  Lab 10/22/19 0909  INR 1.2  Cardiac Enzymes: No results for input(s): CKTOTAL, CKMB, CKMBINDEX, TROPONINI in the last 168 hours.  HbA1C: Hgb A1c MFr Bld  Date/Time Value Ref Range Status  09/04/2019 10:00 AM 7.2 (H) 4.8 - 5.6 % Final    Comment:    (NOTE) Pre diabetes:          5.7%-6.4%  Diabetes:              >6.4%  Glycemic control for   <7.0% adults with diabetes     CBG: Recent Labs  Lab 10/27/19 1115 10/27/19 1919 10/27/19 2310 10/28/19 0307 10/28/19 0757  GLUCAP 128* 189* 195* 168* 195*   35 min spent with >50% time in counseling and coordination of care.   Lynnell Catalan, MD [facility] ICU Physician [facility] [place] [facility]  Pager: [phone] Mobile: [phone] After hours: [phone].  10/28/2019, 8:38 AM

## 2019-10-28 NOTE — Progress Notes (Signed)
  Speech Language Pathology Treatment: Dysphagia;Passy Muir Speaking valve  Patient Details Name: Corey Bennett MRN: [id number] DOB: June 26, 1960 Today's Date: 10/28/2019 Time: 1696-7893 SLP Time Calculation (min) (ACUTE ONLY): 17 min  Assessment / Plan / Recommendation Clinical Impression  Pt tolerated PMV for ~15 minutes with stable VS and no evidence of back pressure, but with reduced ability to use it for verbalizations in light of baseline communication difficulties. He did repeat a few words to SLP with phonation noted to be very soft and wet. SLP provided small amount of ice chips after oral care with increasingly audible secretions noted. Pt does not cough or clear his throat to command despite multimodal cueing. Moderate amount of thin liquid was suctioned from his throat, and Mod cues were provided to initiate swallows. Recommend that pt remain NPO for now, with ongoing SLP f/u for swallowing and communication.    HPI HPI: Pt is a 59 yo male admitted from ALF with status epilepticus in the setting of recent large L MCA stroke, requiring intubation 8/8-8/11. BSE 8/12 recommended NPO with concern for reduced secretion management and pt was subsequently reintubated 8/14. [name] 8/19. Pt was evaluated by OP SLP 7/26 (after completing CIR in [place] and [facility] SLP), noting "receptive and expressive aphasia (likely severe) and some degree of verbal apraxia." He was awaiting a speech generating device. PMH: CVA with aphasia and hemiparesis, HTN, hypercholesterolemia, diabetes      SLP Plan  Continue with current plan of care       Recommendations  Diet recommendations: NPO Medication Administration: Via alternative means      Patient may use Passy-Muir Speech Valve: Intermittently with supervision PMSV Supervision: Full         Oral Care Recommendations: Oral care QID Follow up Recommendations: Skilled Nursing facility SLP Visit Diagnosis: Dysphagia, oropharyngeal phase (R13.12);Aphonia  (R49.1) Plan: Continue with current plan of care       GO                [name]., M.A. CCC-SLP Acute Rehabilitation Services Pager [phone] Office [phone]  10/28/2019, 3:47 PM

## 2019-10-28 NOTE — Progress Notes (Signed)
Nutrition Follow-up  DOCUMENTATION CODES:   Not applicable  INTERVENTION:   Tube feeding via cortrak tube: Osmolite 1.5 at 55 ml/h (1320 ml per day) Prosource TF 45 ml BID  Provides 2060 kcal, 105 gm protein, 1006 ml free water daily   NUTRITION DIAGNOSIS:   Inadequate oral intake related to inability to eat as evidenced by NPO status.  Ongoing  GOAL:   Patient will meet greater than or equal to 90% of their needs  Met.   MONITOR:   Vent status, Labs, Weight trends, TF tolerance, Skin, I & O's  REASON FOR ASSESSMENT:   Ventilator    ASSESSMENT:   59 y.o. 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  He was intubated for airway protection and status and PCCM consulted for admission  Pt discussed during ICU rounds and with RN.  Pt now on trach collar. PEG deferred per family plan for swallow eval prior to PEG placement.    8/11- extubated 8/12- s/p BSE- recommend continue NPO 8/13- cortrak tube placed (tip of tube in stomach), TF initiated 8/14- re-intubated; seizure activity  8/17- rectal tube placed 8/20 - trach placed  Medications reviewed and include: SSI, senokot  Labs reviewed  Current TF:  Vital AF 1.2 @ 70 ml/hr  Provides 2016 kcal, 126 grams of protein  Diet Order:   Diet Order    None      EDUCATION NEEDS:   No education needs have been identified at this time  Skin:  Skin Assessment: Reviewed RN Assessment  Last BM:  125 ml via rectal tube  Height:   Ht Readings from Last 1 Encounters:  10/10/19 '6\' 1"'  (1.854 m)    Weight:   Wt Readings from Last 1 Encounters:  10/28/19 73.2 kg    Ideal Body Weight:  83.6 kg  BMI:  Body mass index is 21.29 kg/m.  Estimated Nutritional Needs:   Kcal:  1900-2100  Protein:  100-115 grams  Fluid:  > 2 L  Shanisha Lech P., RD, LDN, CNSC See AMiON for contact information

## 2019-10-29 LAB — GLUCOSE, CAPILLARY
Glucose-Capillary: 143 mg/dL — ABNORMAL HIGH (ref 70–99)
Glucose-Capillary: 181 mg/dL — ABNORMAL HIGH (ref 70–99)
Glucose-Capillary: 196 mg/dL — ABNORMAL HIGH (ref 70–99)
Glucose-Capillary: 228 mg/dL — ABNORMAL HIGH (ref 70–99)
Glucose-Capillary: 240 mg/dL — ABNORMAL HIGH (ref 70–99)
Glucose-Capillary: 248 mg/dL — ABNORMAL HIGH (ref 70–99)
Glucose-Capillary: 260 mg/dL — ABNORMAL HIGH (ref 70–99)

## 2019-10-29 LAB — CBC WITH DIFFERENTIAL/PLATELET
Abs Immature Granulocytes: 0.09 10*3/uL — ABNORMAL HIGH (ref 0.00–0.07)
Basophils Absolute: 0.1 10*3/uL (ref 0.0–0.1)
Basophils Relative: 0 %
Eosinophils Absolute: 0.3 10*3/uL (ref 0.0–0.5)
Eosinophils Relative: 2 %
HCT: 27.7 % — ABNORMAL LOW (ref 39.0–52.0)
Hemoglobin: 9.2 g/dL — ABNORMAL LOW (ref 13.0–17.0)
Immature Granulocytes: 1 %
Lymphocytes Relative: 7 %
Lymphs Abs: 1 10*3/uL (ref 0.7–4.0)
MCH: 30.5 pg (ref 26.0–34.0)
MCHC: 33.2 g/dL (ref 30.0–36.0)
MCV: 91.7 fL (ref 80.0–100.0)
Monocytes Absolute: 0.7 10*3/uL (ref 0.1–1.0)
Monocytes Relative: 5 %
Neutro Abs: 12.7 10*3/uL — ABNORMAL HIGH (ref 1.7–7.7)
Neutrophils Relative %: 85 %
Platelets: 529 10*3/uL — ABNORMAL HIGH (ref 150–400)
RBC: 3.02 MIL/uL — ABNORMAL LOW (ref 4.22–5.81)
RDW: 15 % (ref 11.5–15.5)
WBC: 14.9 10*3/uL — ABNORMAL HIGH (ref 4.0–10.5)
nRBC: 0 % (ref 0.0–0.2)

## 2019-10-29 LAB — PHOSPHORUS: Phosphorus: 3.5 mg/dL (ref 2.5–4.6)

## 2019-10-29 LAB — COMPREHENSIVE METABOLIC PANEL
ALT: 45 U/L — ABNORMAL HIGH (ref 0–44)
AST: 37 U/L (ref 15–41)
Albumin: 2.3 g/dL — ABNORMAL LOW (ref 3.5–5.0)
Alkaline Phosphatase: 137 U/L — ABNORMAL HIGH (ref 38–126)
Anion gap: 10 (ref 5–15)
BUN: 16 mg/dL (ref 6–20)
CO2: 26 mmol/L (ref 22–32)
Calcium: 8.5 mg/dL — ABNORMAL LOW (ref 8.9–10.3)
Chloride: 102 mmol/L (ref 98–111)
Creatinine, Ser: 1.13 mg/dL (ref 0.61–1.24)
GFR calc Af Amer: >60 mL/min (ref >60)
GFR calc non Af Amer: >60 mL/min (ref >60)
Glucose, Bld: 229 mg/dL — ABNORMAL HIGH (ref 70–99)
Potassium: 3.9 mmol/L (ref 3.5–5.1)
Sodium: 138 mmol/L (ref 135–145)
Total Bilirubin: 0.3 mg/dL (ref 0.3–1.2)
Total Protein: 6.5 g/dL (ref 6.5–8.1)

## 2019-10-29 LAB — MAGNESIUM: Magnesium: 1.8 mg/dL (ref 1.7–2.4)

## 2019-10-29 MED ORDER — CLOPIDOGREL BISULFATE 75 MG PO TABS
75.0000 mg | ORAL_TABLET | Freq: Every day | ORAL | Status: DC
  Administered 2019-10-29 – 2019-11-04 (×6): 75 mg
  Filled 2019-10-29 (×10): qty 1

## 2019-10-29 MED ORDER — ENOXAPARIN SODIUM 40 MG/0.4ML ~~LOC~~ SOLN
40.0000 mg | SUBCUTANEOUS | Status: AC
  Administered 2019-10-29 – 2019-11-08 (×11): 40 mg via SUBCUTANEOUS
  Filled 2019-10-29 (×11): qty 0.4

## 2019-10-29 NOTE — Progress Notes (Signed)
Physical Therapy Treatment Patient Details Name: Corey Bennett MRN: [id number] DOB: 1960/04/10 Today's Date: 10/29/2019    History of Present Illness 59 y.o. 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  He was intubated for airway protection on 10/10/19. Pt extubated on 10/14/19.  on 8/14, pt had continued twitching of Rt UE with AMS, and decreased 02 sats.  He was intubated.  Diagnosis: status epilepticus in setting of prior large Lt MCA infarct; acute kidney injury, possible acute meningitis.  He underwent tracheostomy on 8/19/2.  PMH includes: poorly controlled DM, dysphagia, HTN    PT Comments    Patient received in bed, brother present initially but left as session began. Patient is agreeable to PT/OT session. He requires mod assist for supine to sit. Assist to bring right LE off bed and to position self sitting upright on bed. Cues to scoot forward to edge of bed with feet on floor. Patient requires min +2 assist for sit to stand. Buckling of right knee with weight bearing. Took a few steps from bed to recliner and then another few steps forward. He was unable to back up back to recliner effectively and required mod/max assist to move right LE and +2 mod assist to return safely to recliner. He will continue to benefit from skilled PT while here to improve strength and functional independence.      Follow Up Recommendations  SNF;Supervision/Assistance - 24 hour     Equipment Recommendations  Wheelchair (measurements PT);Wheelchair cushion (measurements PT)    Recommendations for Other Services       Precautions / Restrictions Precautions Precautions: Fall Precaution Comments: trach  Restrictions Weight Bearing Restrictions: No    Mobility  Bed Mobility Overal bed mobility: Needs Assistance Bed Mobility: Supine to Sit     Supine to sit: Mod assist     General bed mobility  comments: assist to initiate movement, move LEs off bed, lift trunk and scoot hips to EOB with cues.  Transfers Overall transfer level: Needs assistance Equipment used: 2 person hand held assist Transfers: Sit to/from [employer] Sit to Stand: Mod assist;+2 physical assistance;+2 safety/equipment Stand pivot transfers: Mod assist;+2 physical assistance;+2 safety/equipment       General transfer comment: Patient with right LE buckling during weight shifting to get to recliner.  Ambulation/Gait Ambulation/Gait assistance: Mod assist;+2 physical assistance;+2 safety/equipment Gait Distance (Feet): 3 Feet Assistive device: 2 person hand held assist Gait Pattern/deviations: Step-to pattern;Decreased stride length;Decreased dorsiflexion - right;Trunk flexed;Shuffle;Narrow base of support Gait velocity: decr   General Gait Details: Able to take a few steps forward with right knee, attempted to take a few steps backward to recliner, unable to move right LE backward, requiring max assist +2 to safely get back into recliner.   Stairs             Wheelchair Mobility    Modified Rankin (Stroke Patients Only)       Balance Overall balance assessment: Needs assistance Sitting-balance support: Feet supported Sitting balance-Leahy Scale: Poor Sitting balance - Comments: UE support and close min guard assist    Standing balance support: During functional activity;Single extremity supported Standing balance-Leahy Scale: Poor Standing balance comment: pt dependent on UE support                            Cognition Arousal/Alertness: Awake/alert Behavior During  Therapy: Flat affect Overall Cognitive Status: History of cognitive impairments - at baseline                                 General Comments: pt demonstrates significant communication deficits.  Per brother, pt is at, or close to his baseline.  Cognition difficult to accurately  assess       Exercises Other Exercises Other Exercises: gentle passive extension of fingers performed  Other Exercises: attempted R LE exercises, patient declines. Only performed marching with assist x 10 reps    General Comments        Pertinent Vitals/Pain Pain Assessment: Faces Faces Pain Scale: Hurts little more Pain Location: he doesn't elaborate Pain Descriptors / Indicators: Grimacing;Guarding Pain Intervention(s): Monitored during session;Repositioned    Home Living                      Prior Function            PT Goals (current goals can now be found in the care plan section) Acute Rehab PT Goals Patient Stated Goal: pt unable to state  PT Goal Formulation: With patient Time For Goal Achievement: 10/29/19 Progress towards PT goals: Progressing toward goals    Frequency    Min 3X/week      PT Plan Current plan remains appropriate    Co-evaluation PT/OT/SLP Co-Evaluation/Treatment: Yes Reason for Co-Treatment: For patient/therapist safety;Necessary to address cognition/behavior during functional activity;To address functional/ADL transfers;Complexity of the patient's impairments (multi-system involvement) PT goals addressed during session: Mobility/safety with mobility;Balance OT goals addressed during session: ADL's and self-care      AM-PAC PT "6 Clicks" Mobility   Outcome Measure  Help needed turning from your back to your side while in a flat bed without using bedrails?: A Little Help needed moving from lying on your back to sitting on the side of a flat bed without using bedrails?: A Little Help needed moving to and from a bed to a chair (including a wheelchair)?: A Lot Help needed standing up from a chair using your arms (e.g., wheelchair or bedside chair)?: A Lot Help needed to walk in hospital room?: Total Help needed climbing 3-5 steps with a railing? : Total 6 Click Score: 12    End of Session Equipment Utilized During  Treatment: Gait belt Activity Tolerance: Patient tolerated treatment well;Other (comment) (decreased initiation of activity this session.) Patient left: in chair;with call bell/phone within reach;with chair alarm set Nurse Communication: Mobility status PT Visit Diagnosis: Other abnormalities of gait and mobility (R26.89);Other symptoms and signs involving the nervous system (R29.898);Muscle weakness (generalized) (M62.81);Unsteadiness on feet (R26.81);Difficulty in walking, not elsewhere classified (R26.2) Hemiplegia - Right/Left: Right Hemiplegia - caused by: Cerebral infarction     Time: 1000-1030 PT Time Calculation (min) (ACUTE ONLY): 30 min  Charges:  $Gait Training: 8-22 mins                     Hatcher Froning, PT, GCS 10/29/19,1:23 PM

## 2019-10-29 NOTE — Progress Notes (Signed)
16FR foley catheter placed with some difficulty. Return of 1000 ml of clear yellow urine noted.

## 2019-10-29 NOTE — Progress Notes (Signed)
Bladder scan done for no noted urinary output with result = 523 ml. Reported to critical care MD with request to insert foley catheter. Order received.

## 2019-10-29 NOTE — Plan of Care (Signed)
  Problem: Activity: Goal: Ability to tolerate increased activity will improve Outcome: Progressing   

## 2019-10-29 NOTE — Progress Notes (Signed)
Occupational Therapy Treatment Patient Details Name: Corey Bennett MRN: [id number] DOB: October 07, 1960 Today's Date: 10/29/2019    History of present illness 59 y.o. 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  He was intubated for airway protection on 10/10/19. Pt extubated on 10/14/19.  on 8/14, pt had continued twitching of Rt UE with AMS, and decreased 02 sats.  He was intubated.  Diagnosis: status epilepticus in setting of prior large Lt MCA infarct; acute kidney injury, possible acute meningitis.  He underwent tracheostomy on 8/19/2.  PMH includes: poorly controlled DM, dysphagia, HTN   OT comments  Pt seen in conjunction with PT.  He demonstrated increased attempts at communication this date. He requires mod A for bed mobility, and mod A +2 for short distance ambulation and transfer to the chair.  He requires min - total A for ADLs.  Goals updated.   He resided at ALF PTA.  Feel with CIR level rehab, he may be able to return to ALF with  Min guard to min A, and reduce caregiver burden.  Will continue to follow.   Follow Up Recommendations  Supervision/Assistance - 24 hour;CIR    Equipment Recommendations  None recommended by OT    Recommendations for Other Services Rehab consult    Precautions / Restrictions Precautions Precautions: Fall Precaution Comments: trach        Mobility Bed Mobility Overal bed mobility: Needs Assistance Bed Mobility: Supine to Sit     Supine to sit: Mod assist     General bed mobility comments: assist to initiate moveement, move LEs off bed, lift trunk and scoot hips to EOB   Transfers Overall transfer level: Needs assistance Equipment used: 2 person hand held assist Transfers: Sit to/from [employer] Sit to Stand: Mod assist;+2 safety/equipment Stand pivot transfers: Mod assist;+2 safety/equipment       General transfer  comment: Assist to boost bottom and assist for transfer    Balance Overall balance assessment: Needs assistance Sitting-balance support: Feet supported Sitting balance-Leahy Scale: Poor Sitting balance - Comments: UE support and close min guard assist                                    ADL either performed or assessed with clinical judgement   ADL Overall ADL's : Needs assistance/impaired Eating/Feeding: NPO   Grooming: Wash/dry face;Set up;Sitting                   Toilet Transfer: Moderate assistance;+2 for safety/equipment;Stand-pivot;BSC   Toileting- Clothing Manipulation and Hygiene: Total assistance;Sit to/from stand Toileting - Clothing Manipulation Details (indicate cue type and reason): flexi seal leaking and pt was assisted with peri care in standing              Vision       Perception     Praxis      Cognition Arousal/Alertness: Awake/alert Behavior During Therapy: Flat affect Overall Cognitive Status: History of cognitive impairments - at baseline                                 General Comments: pt demonstrates significant communication deficits.  Per brother, pt is at, or close to his baseline.  Cognition difficult to accurately assess  Exercises Other Exercises Other Exercises: gentle passive extension of fingers performed    Shoulder Instructions       General Comments      Pertinent Vitals/ Pain       Pain Assessment: Faces Faces Pain Scale: Hurts even more Pain Location: Rt UE - possibly hand?  Pain Descriptors / Indicators: Grimacing;Guarding Pain Intervention(s): Repositioned;Monitored during session;Limited activity within patient's tolerance  Home Living                                          Prior Functioning/Environment              Frequency  Min 2X/week        Progress Toward Goals  OT Goals(current goals can now be found in the care plan section)   Progress towards OT goals: Progressing toward goals (Goals remain appropriate )  Acute Rehab OT Goals Patient Stated Goal: pt unable to state  OT Goal Formulation: Patient unable to participate in goal setting Time For Goal Achievement: 11/12/19 Potential to Achieve Goals: Good ADL Goals Pt Will Perform Grooming: with min guard assist;standing Pt Will Perform Upper Body Bathing: with supervision;with set-up;sitting Pt Will Perform Lower Body Bathing: with min guard assist;sit to/from stand Pt Will Perform Upper Body Dressing: with set-up;with supervision;sitting Pt Will Perform Lower Body Dressing: with min guard assist;sit to/from stand Pt Will Transfer to Toilet: with min assist;ambulating;regular height toilet;bedside commode;grab bars Pt Will Perform Toileting - Clothing Manipulation and hygiene: with min guard assist;sit to/from stand  Plan Discharge plan remains appropriate    Co-evaluation    PT/OT/SLP Co-Evaluation/Treatment: Yes Reason for Co-Treatment: Complexity of the patient's impairments (multi-system involvement);For patient/therapist safety;To address functional/ADL transfers   OT goals addressed during session: ADL's and self-care      AM-PAC OT "6 Clicks" Daily Activity     Outcome Measure   Help from another person eating meals?: Total Help from another person taking care of personal grooming?: A Lot Help from another person toileting, which includes using toliet, bedpan, or urinal?: A Lot Help from another person bathing (including washing, rinsing, drying)?: A Lot Help from another person to put on and taking off regular upper body clothing?: A Lot Help from another person to put on and taking off regular lower body clothing?: Total 6 Click Score: 10    End of Session    OT Visit Diagnosis: Hemiplegia and hemiparesis;Cognitive communication deficit (R41.841) Symptoms and signs involving cognitive functions: Cerebral infarction Hemiplegia - Right/Left:  Right Hemiplegia - dominant/non-dominant: Dominant Hemiplegia - caused by: Cerebral infarction   Activity Tolerance     Patient Left     Nurse Communication          Time: 1001-1030 OT Time Calculation (min): 29 min  Charges: OT General Charges $OT Visit: 1 Visit OT Treatments $Neuromuscular Re-education: 8-22 mins  Eber Jones., OTR/L Acute Rehabilitation Services Pager [phone] Office [phone]    Jeani Hawking M 10/29/2019, 1:08 PM

## 2019-10-29 NOTE — Progress Notes (Signed)
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 Patient is living in a assisted living facility and can ambulate by himself. Patient was found by staff to have a right sided twitching with right eye deviation. He was given Versed and Dilantin and was intubated for airway protection.  Patient was extubated on 8/11 and his seizures was controlled with Keppra, phenytoin, and Vimpat.He was transferred to 9 W. for medical management  On 8/14,patient continued to have seizure with twitching of the right upper extremity. 2 mg of lorazepam was given. Patient continued to desat in the 80s on 15 L of nonrebreather mask with altered mental status. Patient was re intubated to protect airway and transferred to the ICU. On 10/22/2019 he had a regular tracheostomy and 8/23 was going to get a PEG tube placement but this was deferred to his request and he ended up getting a Cortrak placement and is currently getting tube feeds.  Currently he has failed his swallowing significantly and SLP is following closely.  We will continue to follow swallowing rehabilitation prior to PEG tube placement as SLP recommend strict n.p.o.  Assessment & Plan:   Principal Problem:   Status epilepticus ([place]) Active Problems:   Carotid artery stenosis   Seizure (HCC)   CVA (cerebrovascular accident due to intracerebral hemorrhage) (HCC)/ left frontal lobe   AKI (acute kidney injury) ([place])   Essential hypertension   Type 2 diabetes mellitus with hyperlipidemia (HCC)  Chronic respiratory failure with hypoxemia requiring tracheostomy tube placement and mechanical ventilatory support. -Trach collar placed 8/23, remains in place -SpO2: 97  % O2 Flow Rate (L/min): 8 L/min FiO2 (%): 35 % -Continue with duo nebs 3 mL nebulizer every 6 hours as needed for wheezing or shortness of breath -Speech path to reevaluate swallow -Oral suction with tube care  -Transferred to PCU  Status epilepticus in the setting of recent large left MCA stroke -No arm/eye twitching since ICU transfer -Continue current AED regimen/ Dilantin and Vimpat, transition to PO via Cortrak and po when able to swallow  Hypertension -BP this Evening was 151/71 -Avoid initiation of ARB in setting of AKI. -Restarted home Norovasc 5 mg on 8/23, transition all meds PO, SBP <160 -Continue with labetalol 5 mg IV every 2 as needed for high blood pressure of SBP of greater than 180.  Urinary Retention - Continue Benechol - Removed Foley however had to be reinserted last night as he was retaining and had 523 mL in his bladder yesterday     AKI, improved -Improved. BUN/creatinine peaked at 29/2.12 and currently is now 60/1.13 -Avoid further nephrotoxic medications, contrast dyes, hypotension and renally adjust medications -Repeat CMP in a.m.  Diabetes type 2 with hyperglycemia  -Continued current regimen of moderate NovoLog/scale insulin every 4 hours and may need to adjust further -CBGs ranging from 141-248  Dysphagia.  - SLP following. Deferred PEG at patient's request.  - Continue tube feeds via Cortrak - Trial of swallowing rehabilitation prior to PEG tube.  -SLP to re-evaluate  Normocytic anemia -Patient's hemoglobin/hematocrit is stable at 9.2/27.7 -Check anemia panel in the a.m. next-continue to monitor for signs and symptoms of bleeding; currently no overt bleeding noted Repeat CBC in a.m. -We will resume VTE prophylaxis as well  as his home clopidogrel  Leukocytosis -Likely reactive -The patient's WBC went from 10.0 is now 14.9 -Continue to monitor and trend and if necessary we will panculture -Repeat CBC in a.m.  Thrombocytosis -Likely  reactive -Continue to monitor and trend -Patient's platelet count has gone from 460,000 and is now 529,000  Hx of Left MCA stroke with residual aphasia and right hemiparesis  -Continue with aspirin 81 mg p.o. daily, atorvastatin 80 mg p.o. nightly, and clopidogrel 75 mg via Cortrak -We will obtain PT OT to further evaluate and treat  Right arm swelling  -The setting of dependent edema but also has diagnosed SVT in the cephalic and basilic veins -Likely the cause of his swelling is his SVTs -Continue with OT  DVT prophylaxis: Enoxaparin 40 mg sq q24h Code Status: FULL CODE  Family Communication: No family present at bedside  Disposition Plan: SNF vs CIR   Consultants:   PCCM Transfer  Neurology  Procedures:  8/8 ETT 8/14 ETT 08/19 Tracheostomy  8/23 PEG tube  Significant Diagnostic Tests:  8/8 CT head>>No acute intracranial abnormality. Large area of encephalomalacia throughout the left MCA territory, consistent with remote infarct. 8/8 CXR>>lungs clear 8/8 UDS>>+ benzo (but given versed in ED); o/w negative 8/8 MRI of brain>>large territory chronic hemorrhagic infarct in the left frontal lobe.Ill-defined hyperintensity throughout the left basal ganglia and left thalamus with mild enhancement in dobutamine likely secondary to encephalitis, infiltrating tumor or atypical subacute infarction 8/8 MRI of face and trigeminal region>>rules out soft tissue abscess 08/17 CXR>>improved aeration to the right lung base  8/17 CT Abdomen wo Contrast >>cholelithiasis/trace ascites/aorthic atherosclerosis   Micro Data:  8/8 Sars-CoV-2>>negative 8/8 BCx2>> no growth 8/8 UC>>Klebsiella pneumonia 08/14 BCx>> negative 08/14 Trach Cx>> gram positive rods and cocci in pairs 08/17 Trach Cx>> negative    Antimicrobials:  Anti-infectives (From admission, onward)   Start     Dose/Rate Route Frequency Ordered Stop   10/18/19 2300  vancomycin (VANCOREADY) IVPB 750 mg/150 mL   Status:  Discontinued        750 mg 150 mL/hr over 60 Minutes Intravenous Every 24 hours 10/18/19 1359 10/19/19 1217   10/18/19 0200  vancomycin (VANCOREADY) IVPB 500 mg/100 mL  Status:  Discontinued        500 mg 100 mL/hr over 60 Minutes Intravenous Every 12 hours 10/17/19 1242 10/18/19 1359   10/17/19 1245  vancomycin (VANCOCIN) IVPB 1000 mg/200 mL premix        1,000 mg 200 mL/hr over 60 Minutes Intravenous  Once 10/17/19 1242 10/17/19 1441   10/17/19 1245  piperacillin-tazobactam (ZOSYN) IVPB 3.375 g        3.375 g 12.5 mL/hr over 240 Minutes Intravenous Every 8 hours 10/17/19 1242 10/24/19 2359   10/17/19 0900  ampicillin-sulbactam (UNASYN) 1.5 g in sodium chloride 0.9 % 100 mL IVPB  Status:  Discontinued        1.5 g 200 mL/hr over 30 Minutes Intravenous Every 6 hours 10/17/19 0809 10/17/19 0850   10/17/19 0900  Ampicillin-Sulbactam (UNASYN) 3 g in sodium chloride 0.9 % 100 mL IVPB  Status:  Discontinued        3 g 200 mL/hr over 30 Minutes Intravenous Every 6 hours 10/17/19 0850 10/17/19 1242   10/14/19 0600  vancomycin (VANCOREADY) IVPB 500 mg/100 mL  Status:  Discontinued        500 mg 100 mL/hr over 60 Minutes Intravenous Every 12 hours 10/14/19 0057 10/16/19 1534   10/12/19 1600  ampicillin (OMNIPEN) 2  g in sodium chloride 0.9 % 100 mL IVPB  Status:  Discontinued        2 g 300 mL/hr over 20 Minutes Intravenous Every 6 hours 10/12/19 1400 10/14/19 0934   10/12/19 0000  vancomycin (VANCOREADY) IVPB 750 mg/150 mL  Status:  Discontinued        750 mg 150 mL/hr over 60 Minutes Intravenous Every 12 hours 10/11/19 1254 10/14/19 0046   10/11/19 1230  cefTRIAXone (ROCEPHIN) 2 g in sodium chloride 0.9 % 100 mL IVPB  Status:  Discontinued        2 g 200 mL/hr over 30 Minutes Intravenous Every 12 hours 10/11/19 1133 10/16/19 1534   10/11/19 1230  ampicillin (OMNIPEN) 2 g in sodium chloride 0.9 % 100 mL IVPB  Status:  Discontinued        2 g 300 mL/hr over 20 Minutes Intravenous Every  4 hours 10/11/19 1133 10/12/19 1400   10/11/19 1200  vancomycin (VANCOREADY) IVPB 1500 mg/300 mL        1,500 mg 150 mL/hr over 120 Minutes Intravenous  Once 10/11/19 1135 10/11/19 1419   10/11/19 1145  vancomycin (VANCOCIN) IVPB 1000 mg/200 mL premix  Status:  Discontinued        1,000 mg 200 mL/hr over 60 Minutes Intravenous  Once 10/11/19 1133 10/11/19 1135      Subjective: Seen and examined at bedside and he had some aphasia.  Denying any pain currently.  No nausea or vomiting but shakes his head yes no.  No no other concerns or complaints at this time.  Objective: Vitals:   10/29/19 1140 10/29/19 1608 10/29/19 2006 10/29/19 2015  BP: (!) 146/56 (!) 154/70  (!) 151/71  Pulse: 90 88 92 89  Resp: 16 18 (!) 23 17  Temp: 99.3 F (37.4 C) 99.1 F (37.3 C)  99.6 F (37.6 C)  TempSrc: Oral Oral  Oral  SpO2: 99% 95% 98% 97%  Weight:      Height:        Intake/Output Summary (Last 24 hours) at 10/29/2019 2050 Last data filed at 10/29/2019 1800 Gross per 24 hour  Intake 1940 ml  Output 900 ml  Net 1040 ml   Filed Weights   10/23/19 0500 10/24/19 0500 10/28/19 0500  Weight: 81.9 kg 81.8 kg 73.2 kg   Examination: Physical Exam:  Constitutional: Thin elderly chronically ill-appearing Caucasian male in no acute distress in a chair bedside Eyes: Lids and conjunctivae normal, sclerae anicteric  ENMT: External Ears, Nose appear normal.  Has a core track in place Neck: Has a tracheostomy with trach collar Respiratory: Diminished to auscultation bilaterally with coarse breath sounds, no wheezing, rales, rhonchi or crackles. Normal respiratory effort and patient is not tachypenic. No accessory muscle use.  Has a trach collar and wearing 8 L Cardiovascular: RRR, no murmurs / rubs / gallops. S1 and S2 auscultated.  Has some edema on the right upper extremity Abdomen: Soft, non-tender, non-distended. Bowel sounds positive.  GU: Deferred. Musculoskeletal: No clubbing / cyanosis of  digits/nails. No joint deformity upper and lower extremities.  Skin: No rashes, lesions, ulcers on limited skin evaluation. No induration; Warm and dry.  Neurologic: Has some hemiplegia and aphasia Psychiatric: Normal judgment and insight. Alert and oriented x 3. Normal mood and appropriate affect.   Data Reviewed: I have personally reviewed following labs and imaging studies  CBC: Recent Labs  Lab 10/25/19 1735 10/26/19 0500 10/27/19 0427 10/28/19 0812 10/29/19 0924  WBC 9.3 8.4 9.2 10.0  14.9*  NEUTROABS  --   --   --   --  12.7*  HGB 8.1* 8.3* 8.3* 8.8* 9.2*  HCT 25.2* 25.6* 25.4* 26.7* 27.7*  MCV 92.0 90.8 90.4 89.9 91.7  PLT 415* 441* 460* 495* 626*   Basic Metabolic Panel: Recent Labs  Lab 10/25/19 0730 10/25/19 0730 10/26/19 0500 10/27/19 0427 10/27/19 1422 10/28/19 0812 10/29/19 0456 10/29/19 0924  NA 141  --  139 140  --  138 138  --   K 3.4*   < > 3.3* 3.0* 3.7 3.6 3.9  --   CL 108  --  106 107  --  104 102  --   CO2 22  --  22 21*  --  23 26  --   GLUCOSE 127*  --  117* 136*  --  206* 229*  --   BUN 15  --  11 9  --  13 16  --   CREATININE 1.44*  --  1.24 1.10  --  1.09 1.13  --   CALCIUM 8.1*  --  8.1* 7.6*  --  8.2* 8.5*  --   MG  --   --   --   --   --  1.9  --  1.8  PHOS  --   --   --   --   --   --   --  3.5   < > = values in this interval not displayed.   GFR: Estimated Creatinine Clearance: 72.9 mL/min (by C-G formula based on SCr of 1.13 mg/dL). Liver Function Tests: Recent Labs  Lab 10/25/19 0730 10/26/19 0500 10/27/19 0427 10/28/19 0812 10/29/19 0456  AST 47* 40 36 39 37  ALT 68* 58* 45* 45* 45*  ALKPHOS 133* 126 114 134* 137*  BILITOT 0.4 0.5 0.4 0.3 0.3  PROT 5.9* 5.9* 5.5* 6.1* 6.5  ALBUMIN 2.0* 2.0* 1.9* 2.2* 2.3*   No results for input(s): LIPASE, AMYLASE in the last 168 hours. No results for input(s): AMMONIA in the last 168 hours. Coagulation Profile: No results for input(s): INR, PROTIME in the last 168 hours. Cardiac  Enzymes: No results for input(s): CKTOTAL, CKMB, CKMBINDEX, TROPONINI in the last 168 hours. BNP (last 3 results) No results for input(s): PROBNP in the last 8760 hours. HbA1C: No results for input(s): HGBA1C in the last 72 hours. CBG: Recent Labs  Lab 10/29/19 0350 10/29/19 0743 10/29/19 1143 10/29/19 1609 10/29/19 2010  GLUCAP 181* 196* 228* 240* 248*   Lipid Profile: No results for input(s): CHOL, HDL, LDLCALC, TRIG, CHOLHDL, LDLDIRECT in the last 72 hours. Thyroid Function Tests: No results for input(s): TSH, T4TOTAL, FREET4, T3FREE, THYROIDAB in the last 72 hours. Anemia Panel: No results for input(s): VITAMINB12, FOLATE, FERRITIN, TIBC, IRON, RETICCTPCT in the last 72 hours. Sepsis Labs: No results for input(s): PROCALCITON, LATICACIDVEN in the last 168 hours.  Recent Results (from the past 240 hour(s))  Culture, respiratory (non-expectorated)     Status: None   Collection Time: 10/20/19 11:06 AM   Specimen: Tracheal Aspirate; Respiratory  Result Value Ref Range Status   Specimen Description TRACHEAL ASPIRATE  Final   Special Requests NONE  Final   Gram Stain   Final    RARE WBC PRESENT, PREDOMINANTLY PMN NO ORGANISMS SEEN    Culture   Final    NO GROWTH 2 DAYS Performed at [facility], [place] [address] [place], [place] [address]    Report Status 10/23/2019 FINAL  Final  RN Pressure Injury Documentation:     Estimated body mass index is 21.29 kg/m as calculated from the following:   Height as of this encounter: _0  (1.854 m).   Weight as of this encounter: 73.2 kg.  Malnutrition Type:  Nutrition Problem: Inadequate oral intake Etiology: inability to eat   Malnutrition Characteristics:  Signs/Symptoms: NPO status   Nutrition Interventions:  Interventions: Tube feeding    Radiology Studies: No results found.   Scheduled Meds: . amLODipine  10 mg Per Tube Daily  . aspirin  81 mg Per Tube Daily  . atorvastatin  80 mg Per Tube Daily   . bethanechol  15 mg Per Tube TID  . chlorhexidine gluconate (MEDLINE KIT)  15 mL Mouth Rinse BID  . Chlorhexidine Gluconate Cloth  6 each Topical Daily  . clopidogrel  75 mg Oral Daily  . enoxaparin (LOVENOX) injection  40 mg Subcutaneous Q24H  . feeding supplement (PROSource TF)  45 mL Per Tube BID  . Gerhardt's butt cream   Topical Daily  . insulin aspart  0-15 Units Subcutaneous Q4H  . lacosamide  150 mg Per Tube BID  . levETIRAcetam  1,000 mg Per Tube BID  . mouth rinse  15 mL Mouth Rinse q12n4p  . phenytoin  100 mg Per Tube TID  . QUEtiapine  50 mg Per Tube QHS  . sennosides  5 mL Per Tube BID   Continuous Infusions: . sodium chloride Stopped (10/15/19 1834)  . feeding supplement (OSMOLITE 1.5 CAL) 1,000 mL (10/29/19 1248)    LOS: 18 days   Kerney Elbe, DO [facility] PAGER is on [place]  If 7PM-7AM, please contact night-coverage [email or web address]

## 2019-10-29 NOTE — NC FL2 (Signed)
[place] LEVEL OF CARE SCREENING TOOL     IDENTIFICATION  Patient Name: Corey Bennett Birthdate: Aug 29, 1960 Sex: male Admission Date (Current Location): 10/10/2019  [facility] and [place] Number:  Herbalist and Address:  [facility], [place] [address], [place],  [address]      Provider Number: [id number]  Attending Physician Name and Address:  Kerney Elbe, DO  Relative Name and Phone Number:       Current Level of Care: Hospital Recommended Level of Care: [place] Prior Approval Number:    Date Approved/Denied:   PASRR Number: [id number]  Discharge Plan: SNF    Current Diagnoses: Patient Active Problem List   Diagnosis Date Noted  . 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 AKI (acute kidney injury) ([place]) 10/16/2019  . Essential hypertension 10/16/2019  . Type 2 diabetes mellitus with hyperlipidemia ([place]) 10/16/2019  . Seizure ([place]) 10/11/2019  . Altered mental status   . Status epilepticus ([place])   . Carotid artery stenosis 09/08/2019    Orientation RESPIRATION BLADDER Height & Weight     Self  Tracheostomy, O2 (76m [place], 8L) Incontinent Weight: 161 lb 6 oz (73.2 kg) Height:  '6\' 1"'  (185.4 cm)  BEHAVIORAL SYMPTOMS/MOOD NEUROLOGICAL BOWEL NUTRITION STATUS      Incontinent Diet, Feeding tube (see DC summary)  AMBULATORY STATUS COMMUNICATION OF NEEDS Skin   Extensive Assist Non-Verbally Normal                       Personal Care Assistance Level of Assistance  Bathing, Feeding, Dressing Bathing Assistance: Maximum assistance Feeding assistance: Maximum assistance Dressing Assistance: Maximum assistance     Functional Limitations Info  Speech     Speech Info: Impaired    SPECIAL CARE FACTORS FREQUENCY  PT (By licensed PT), OT (By licensed OT), Speech therapy     PT Frequency: 5x/wk OT Frequency: 5x/wk     Speech  Therapy Frequency: 5x/wk      Contractures Contractures Info: Not present    Additional Factors Info  Code Status, Allergies, Psychotropic, Insulin Sliding Scale Code Status Info: Full Allergies Info: NKA Psychotropic Info: Seroquel 574mdaily at bed Insulin Sliding Scale Info: 0-15 units every 4 hours       Current Medications (10/29/2019):  This is the current hospital active medication list Current Facility-Administered Medications  Medication Dose Route Frequency Provider Last Rate Last Admin  . 0.9 %  sodium chloride infusion   Intravenous PRN AgKipp BroodMD   Stopped at 10/15/19 1834  . acetaminophen (TYLENOL) 160 MG/5ML solution 650 mg  650 mg Per Tube Q6H PRN AgKipp BroodMD   650 mg at 10/18/19 0427  . amLODipine (NORVASC) tablet 10 mg  10 mg Per Tube Daily AgKipp BroodMD   10 mg at 10/29/19 0858  . aspirin chewable tablet 81 mg  81 mg Per Tube Daily AgKipp BroodMD   81 mg at 10/29/19 0858  . atorvastatin (LIPITOR) tablet 80 mg  80 mg Per Tube Daily AgKipp BroodMD   80 mg at 10/29/19 0858  . bethanechol (URECHOLINE) tablet 15 mg  15 mg Per Tube TID AgKipp BroodMD   15 mg at 10/29/19 0858  . chlorhexidine gluconate (MEDLINE KIT) (PERIDEX) 0.12 % solution 15 mL  15 mL Mouth Rinse BID AgKipp BroodMD   15 mL at 10/29/19 0859  . Chlorhexidine Gluconate  Cloth 2 % PADS 6 each  6 each Topical Daily Kipp Brood, MD   6 each at 10/28/19 2117  . feeding supplement (OSMOLITE 1.5 CAL) liquid 1,000 mL  1,000 mL Per Tube Continuous Kipp Brood, MD 55 mL/hr at 10/29/19 1248 1,000 mL at 10/29/19 1248  . feeding supplement (PROSource TF) liquid 45 mL  45 mL Per Tube BID Kipp Brood, MD   45 mL at 10/29/19 0907  . Gerhardt's butt cream   Topical Daily Kipp Brood, MD   Given at 10/29/19 [id number]  . insulin aspart (novoLOG) injection 0-15 Units  0-15 Units Subcutaneous Q4H Kipp Brood, MD   5 Units at 10/29/19 1247  . ipratropium-albuterol (DUONEB) 0.5-2.5 (3)  MG/3ML nebulizer solution 3 mL  3 mL Nebulization Q6H PRN Kipp Brood, MD   3 mL at 10/20/19 0933  . labetalol (NORMODYNE) injection 5 mg  5 mg Intravenous Q2H PRN Kipp Brood, MD   5 mg at 10/13/19 0304  . lacosamide (VIMPAT) tablet 150 mg  150 mg Per Tube BID Kipp Brood, MD   150 mg at 10/29/19 0857  . levETIRAcetam (KEPPRA) 100 MG/ML solution 1,000 mg  1,000 mg Per Tube BID Kipp Brood, MD   1,000 mg at 10/29/19 0858  . LORazepam (ATIVAN) injection 2 mg  2 mg Intravenous PRN Kipp Brood, MD   2 mg at 10/17/19 0948  . MEDLINE mouth rinse  15 mL Mouth Rinse q12n4p Agarwala, Ravi, MD   15 mL at 10/29/19 1246  . phenytoin (DILANTIN) 125 MG/5ML suspension 100 mg  100 mg Per Tube TID Kipp Brood, MD   100 mg at 10/29/19 0907  . polyethylene glycol (MIRALAX / GLYCOLAX) packet 17 g  17 g Per Tube Daily PRN Agarwala, Einar Grad, MD      . QUEtiapine (SEROQUEL) tablet 50 mg  50 mg Per Tube QHS Kipp Brood, MD   50 mg at 10/28/19 2118  . sennosides (SENOKOT) 8.8 MG/5ML syrup 5 mL  5 mL Per Tube BID Kipp Brood, MD   5 mL at 10/29/19 8638     Discharge Medications: Please see discharge summary for a list of discharge medications.  Relevant Imaging Results:  Relevant Lab Results:   Additional Information SS#: [id number]  Geralynn Ochs, LCSW

## 2019-10-30 ENCOUNTER — Inpatient Hospital Stay (HOSPITAL_COMMUNITY): Payer: BC Managed Care – PPO

## 2019-10-30 DIAGNOSIS — M7989 Other specified soft tissue disorders: Secondary | ICD-10-CM

## 2019-10-30 DIAGNOSIS — R509 Fever, unspecified: Secondary | ICD-10-CM

## 2019-10-30 LAB — GLUCOSE, CAPILLARY
Glucose-Capillary: 176 mg/dL — ABNORMAL HIGH (ref 70–99)
Glucose-Capillary: 199 mg/dL — ABNORMAL HIGH (ref 70–99)
Glucose-Capillary: 203 mg/dL — ABNORMAL HIGH (ref 70–99)
Glucose-Capillary: 210 mg/dL — ABNORMAL HIGH (ref 70–99)
Glucose-Capillary: 251 mg/dL — ABNORMAL HIGH (ref 70–99)
Glucose-Capillary: 270 mg/dL — ABNORMAL HIGH (ref 70–99)

## 2019-10-30 LAB — COMPREHENSIVE METABOLIC PANEL WITH GFR
ALT: 46 U/L — ABNORMAL HIGH (ref 0–44)
AST: 41 U/L (ref 15–41)
Albumin: 2.4 g/dL — ABNORMAL LOW (ref 3.5–5.0)
Alkaline Phosphatase: 147 U/L — ABNORMAL HIGH (ref 38–126)
Anion gap: 8 (ref 5–15)
BUN: 20 mg/dL (ref 6–20)
CO2: 26 mmol/L (ref 22–32)
Calcium: 8.7 mg/dL — ABNORMAL LOW (ref 8.9–10.3)
Chloride: 102 mmol/L (ref 98–111)
Creatinine, Ser: 1.15 mg/dL (ref 0.61–1.24)
GFR calc Af Amer: >60 mL/min
GFR calc non Af Amer: >60 mL/min
Glucose, Bld: 209 mg/dL — ABNORMAL HIGH (ref 70–99)
Potassium: 3.6 mmol/L (ref 3.5–5.1)
Sodium: 136 mmol/L (ref 135–145)
Total Bilirubin: 0.3 mg/dL (ref 0.3–1.2)
Total Protein: 6.8 g/dL (ref 6.5–8.1)

## 2019-10-30 LAB — CBC WITH DIFFERENTIAL/PLATELET
Abs Immature Granulocytes: 0.09 10*3/uL — ABNORMAL HIGH (ref 0.00–0.07)
Basophils Absolute: 0.1 10*3/uL (ref 0.0–0.1)
Basophils Relative: 0 %
Eosinophils Absolute: 0.3 10*3/uL (ref 0.0–0.5)
Eosinophils Relative: 2 %
HCT: 26.7 % — ABNORMAL LOW (ref 39.0–52.0)
Hemoglobin: 8.7 g/dL — ABNORMAL LOW (ref 13.0–17.0)
Immature Granulocytes: 1 %
Lymphocytes Relative: 8 %
Lymphs Abs: 1.2 10*3/uL (ref 0.7–4.0)
MCH: 30.2 pg (ref 26.0–34.0)
MCHC: 32.6 g/dL (ref 30.0–36.0)
MCV: 92.7 fL (ref 80.0–100.0)
Monocytes Absolute: 0.8 10*3/uL (ref 0.1–1.0)
Monocytes Relative: 5 %
Neutro Abs: 12.2 10*3/uL — ABNORMAL HIGH (ref 1.7–7.7)
Neutrophils Relative %: 84 %
Platelets: 478 10*3/uL — ABNORMAL HIGH (ref 150–400)
RBC: 2.88 MIL/uL — ABNORMAL LOW (ref 4.22–5.81)
RDW: 15.2 % (ref 11.5–15.5)
WBC: 14.5 10*3/uL — ABNORMAL HIGH (ref 4.0–10.5)
nRBC: 0 % (ref 0.0–0.2)

## 2019-10-30 LAB — PROCALCITONIN: Procalcitonin: <0.1 ng/mL

## 2019-10-30 LAB — LACTIC ACID, PLASMA
Lactic Acid, Venous: 1.2 mmol/L (ref 0.5–1.9)
Lactic Acid, Venous: 1.2 mmol/L (ref 0.5–1.9)

## 2019-10-30 LAB — MAGNESIUM: Magnesium: 1.8 mg/dL (ref 1.7–2.4)

## 2019-10-30 LAB — PHOSPHORUS: Phosphorus: 3.7 mg/dL (ref 2.5–4.6)

## 2019-10-30 IMAGING — DX DG CHEST 1V PORT
1 series · 2 of 2 positions shown · non-contrast
Comparison: Chest x-ray [DATE].

CLINICAL DATA: Shortness of breath.

EXAM:
PORTABLE CHEST 1 VIEW

[Series 1: chest ap · 0.14mm/px · 2 of 2 slices shown]
[im 1/2]
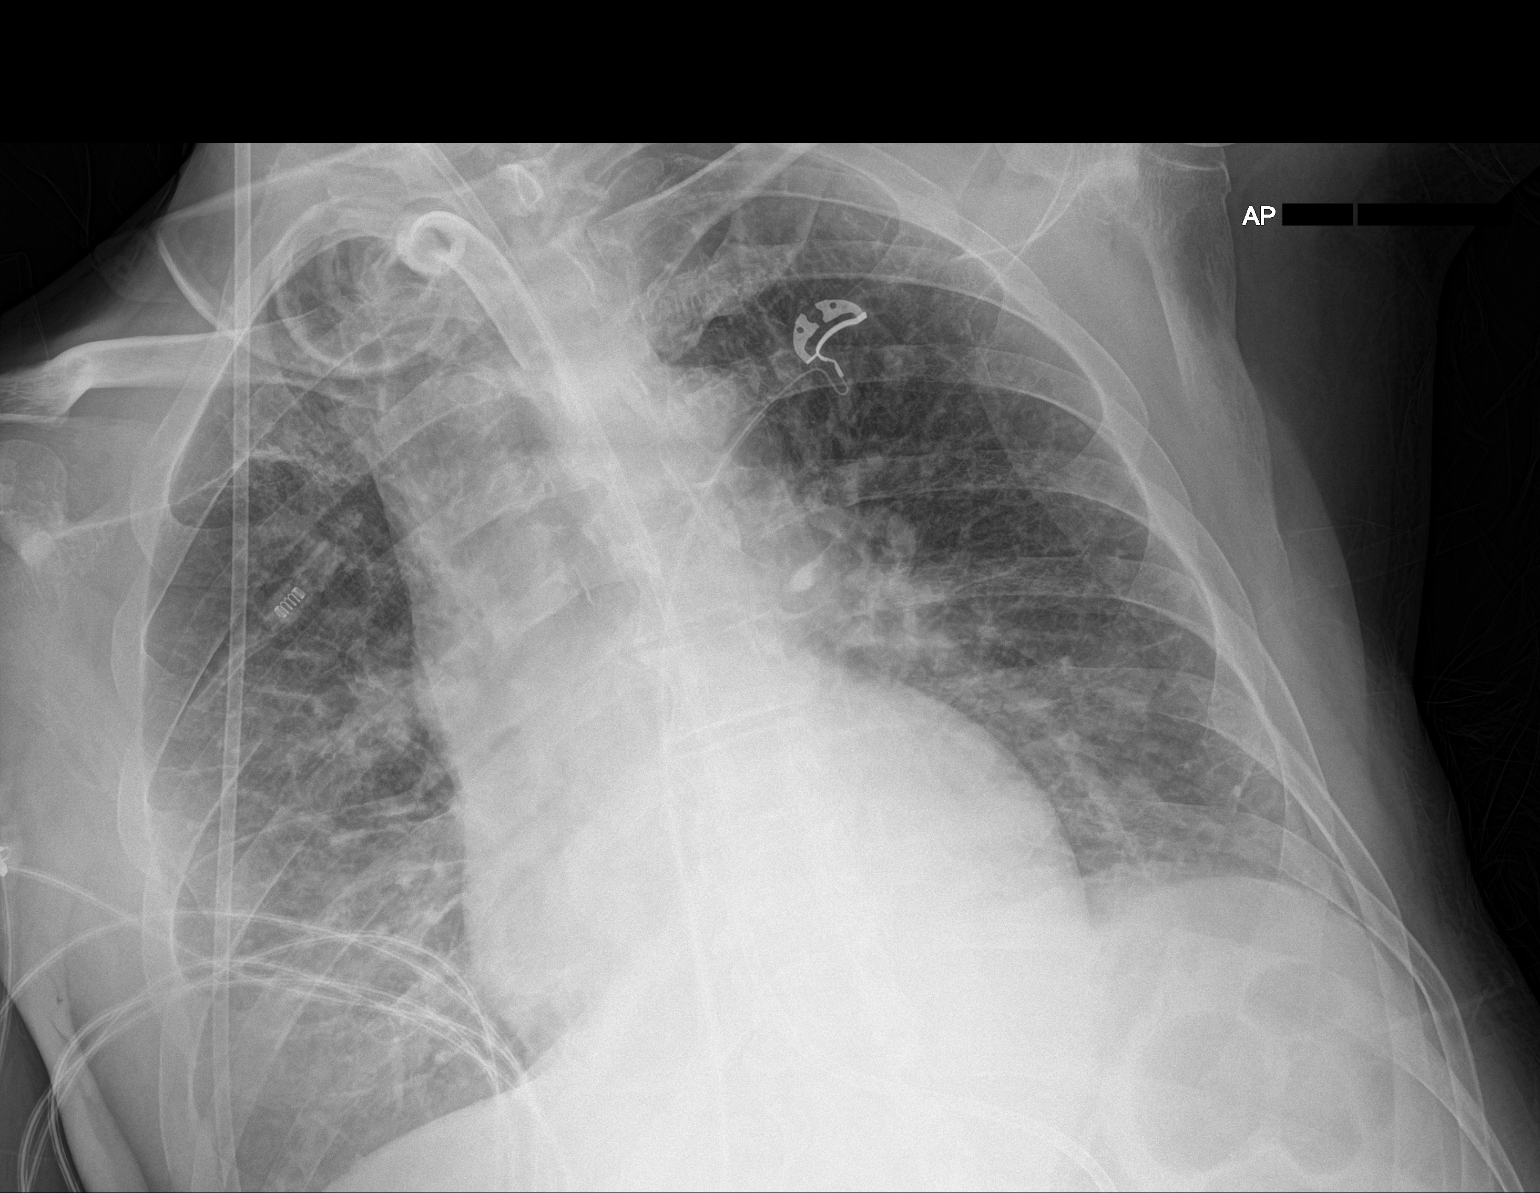
[im 2/2]
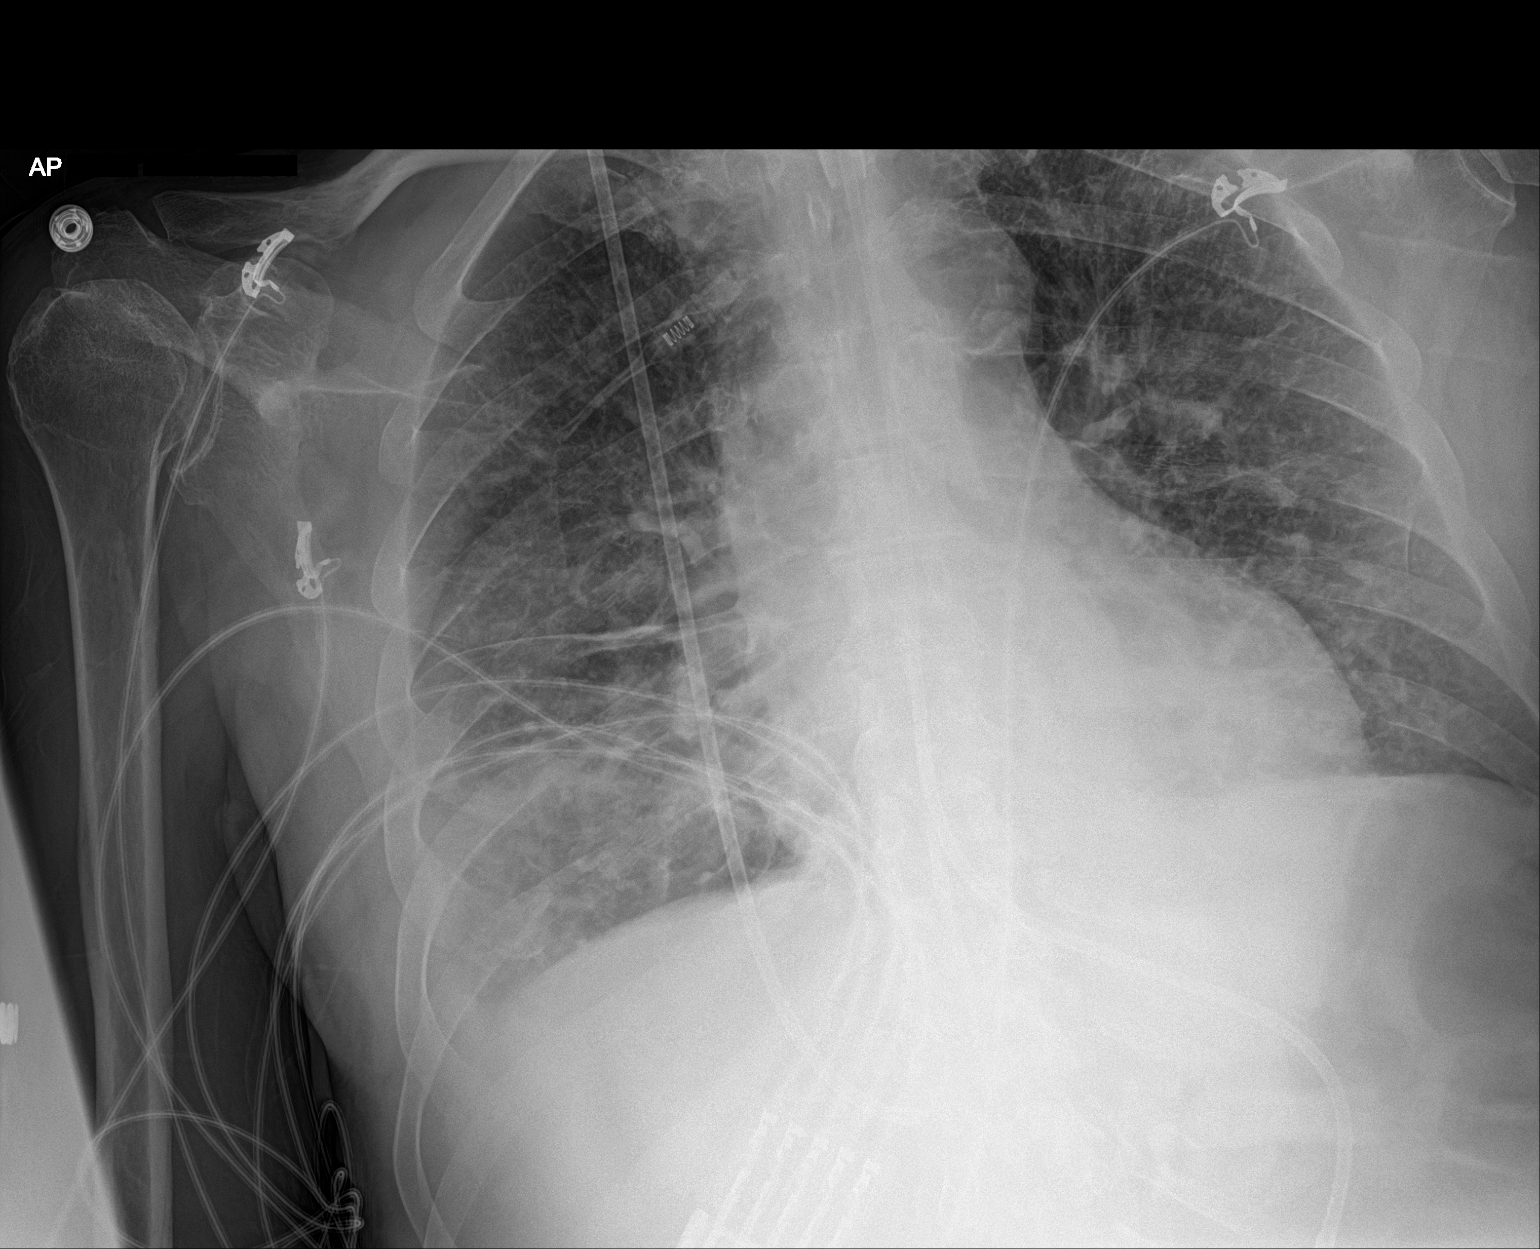

[2 of 2 positions shown; findings below may reference images not displayed]

FINDINGS: Interim removal of left PICC line. Tracheostomy tube and feeding
tube in stable position. Cardiomegaly. Diffuse bilateral
interstitial prominence consistent with interstitial edema and or
pneumonitis. Small right pleural effusion.
IMPRESSION: 1. Interim removal of left PICC line. Tracheostomy tube and feeding
tube in stable position.

2.  Cardiomegaly.

3. Diffuse bilateral interstitial prominence noted on today's exam.
Small right pleural effusion noted on today's exam. CHF could
present in this fashion. Pneumonitis cannot be excluded.

## 2019-10-30 MED ORDER — GUAIFENESIN 100 MG/5ML PO SOLN
5.0000 mL | ORAL | Status: DC | PRN
  Administered 2019-10-31 – 2019-11-06 (×3): 100 mg via ORAL
  Filled 2019-10-30 (×3): qty 5

## 2019-10-30 MED ORDER — SODIUM CHLORIDE 0.9 % IV SOLN
2.0000 g | INTRAVENOUS | Status: DC
  Administered 2019-10-30 – 2019-11-03 (×5): 2 g via INTRAVENOUS
  Filled 2019-10-30: qty 2
  Filled 2019-10-30 (×5): qty 20
  Filled 2019-10-30: qty 2

## 2019-10-30 MED ORDER — IPRATROPIUM-ALBUTEROL 0.5-2.5 (3) MG/3ML IN SOLN
3.0000 mL | Freq: Four times a day (QID) | RESPIRATORY_TRACT | Status: DC
  Filled 2019-10-30: qty 3

## 2019-10-30 MED ORDER — FUROSEMIDE 10 MG/ML IJ SOLN
40.0000 mg | Freq: Once | INTRAMUSCULAR | Status: AC
  Administered 2019-10-30: 40 mg via INTRAVENOUS
  Filled 2019-10-30: qty 4

## 2019-10-30 NOTE — Progress Notes (Signed)
Inpatient Diabetes Program Recommendations  AACE/ADA: New Consensus Statement on Inpatient Glycemic Control (2015)  Target Ranges:  Prepandial:   less than 140 mg/dL      Peak postprandial:   less than 180 mg/dL (1-2 hours)      Critically ill patients:  140 - 180 mg/dL   Lab Results  Component Value Date   GLUCAP 199 (H) 10/30/2019   HGBA1C 7.2 (H) 09/04/2019    Review of Glycemic Control Results for Corey Bennett, [name] (MRN [id number]) as of 10/30/2019 11:53  Ref. Range 10/29/2019 16:09 10/29/2019 20:10 10/29/2019 23:52 10/30/2019 04:02 10/30/2019 08:15  Glucose-Capillary Latest Ref Range: 70 - 99 mg/dL 557 (H) 322 (H) 025 (H) 203 (H) 199 (H)    Inpatient Diabetes Program Recommendations:    Novolog 2-3 units Q4H tube feed coverage.  Stop if feeds are held or discontinued.  Will continue to follow while inpatient.  Thank you, Dulce Sellar, RN, BSN Diabetes Coordinator Inpatient Diabetes Program [phone] (team pager from 8a-5p)

## 2019-10-30 NOTE — Progress Notes (Signed)
  Speech Language Pathology Treatment: Dysphagia  Patient Details Name: Corey Bennett MRN: [id number] DOB: [birth year] Today's Date: 10/30/2019 Time: 7829-5621 SLP Time Calculation (min) (ACUTE ONLY): 28 min  Assessment / Plan / Recommendation Clinical Impression  Pt was seen for treatment. He was alert and cooperative throughout the session. Cuff was deflated at baseline and he tolerated PMSV for 24 minutes with vitals RR 22-25, SpO2 94-97 and HR 96-98. Pt was able to expectorate secretions orally and tracheally and he communicated in short phrases. Intelligibility was reduced due to articulatory imprecision, wet vocal quality, and ambient noise from trach humidification. Bolus formation was prolonged with trials of ice chips and puree solids. Pt did not swallow puree despite verbal and tactile cueing and it was ultimately removed using oral swab. No significant increase in baseline wet vocal quality was noted, but pt exhibited coughing following three trials. Pt's brother, [name], arrived at the end of the session and was educated regarding the pt's progress and the plan of care. All of his questions were answered to his satisfaction. SLP will continue to follow pt and likely perform modified barium swallow study next week to assess safety with p.o. intake and determine whether placement of a G-tube may be indicated. Pt's brother has verbalized agreement with this plan.    HPI HPI: Pt is a 59 yo male admitted from ALF with status epilepticus in the setting of recent large L MCA stroke, requiring intubation 8/8-8/11. BSE 8/12 recommended NPO with concern for reduced secretion management and pt was subsequently reintubated 8/14. [name] 8/19. 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 PMH: CVA with aphasia and hemiparesis, HTN,  hypercholesterolemia, diabetes      SLP Plan  Continue with current plan of care       Recommendations  Diet recommendations: NPO Medication Administration: Via alternative means      Patient may use Passy-Muir Speech Valve: Intermittently with supervision PMSV Supervision: Full         Oral Care Recommendations: Oral care QID Follow up Recommendations: Skilled Nursing facility SLP Visit Diagnosis: Dysphagia, oropharyngeal phase (R13.12);Aphonia (R49.1) Plan: Continue with current plan of care       Fujie Dickison I. Vear Clock, MS, CCC-SLP Acute Rehabilitation Services Office number [phone] Pager [phone]                Scheryl Marten 10/30/2019, 9:41 AM

## 2019-10-30 NOTE — Progress Notes (Signed)
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 Patient is living in a assisted living facility and can ambulate by himself. Patient was found by staff to have a right sided twitching with right eye deviation. He was given Versed and Dilantin and was intubated for airway protection.  Patient was extubated on 8/11 and his seizures was controlled with Keppra, phenytoin, and Vimpat.He was transferred to [address] for medical management  On 8/14,patient continued to have seizure with twitching of the right upper extremity. 2 mg of lorazepam was given. Patient continued to desat in the 80s on 15 L of nonrebreather mask with altered mental status. Patient was re intubated to protect airway and transferred to the ICU. On 10/22/2019 he had a regular tracheostomy and 8/23 was going to get a PEG tube placement but this was deferred to his request and he ended up getting a Cortrak placement and is currently getting tube feeds.  Currently he has failed his swallowing significantly and SLP is following closely.  We will continue to follow swallowing rehabilitation prior to PEG tube placement as SLP recommend strict n.p.o.  Today he failed his swallow study again and I discussed with him about PEG tube placement and he wants to wait the weekend and if he feels he can monitor that he will likely be agreeable.  Assessment & Plan:   Principal Problem:   Status epilepticus ([place]) Active Problems:   Carotid artery stenosis   Seizure (HCC)   CVA (cerebrovascular accident due to intracerebral hemorrhage) (HCC)/ left frontal lobe   AKI (acute kidney injury) ([place])   Essential hypertension   Type 2 diabetes mellitus with  hyperlipidemia (HCC)  Chronic respiratory failure with hypoxemia requiring tracheostomy tube placement and mechanical ventilatory support. -Trach collar placed 8/23, remains in place -SpO2: 96 % O2 Flow Rate (L/min): 8 L/min FiO2 (%): 35 % -Continue with duo nebs 3 mL nebulizer every 6 hours as needed for wheezing or shortness of breath -Speech path to reevaluate swallow and he failed again -Oral suction with tube care  -Transferred to PCU -CXR showed "Interim removal of left PICC line. Tracheostomy tube and feeding tube in stable position. Cardiomegaly. Diffuse bilateral interstitial prominence noted on today's exam. Small right pleural effusion noted on today's exam. CHF could present in this fashion. Pneumonitis cannot be excluded." -We will give him a dose of IV Lasix to see if this will help his respiratory status  Fever -Unclear etiology and is 101.9 -chest x-ray as above -Patient's lactic acid level is 1.2 and procalcitonin level was less than 0.10 -He does have a leukocytosis and his WBC has increased to 14.5 from a few days ago -Check urinalysis given that he is having acute urinary retention to rule out a UTI -Blood cultures x2 ordered and pending  -urinalysis and urine culture still pending to be drawn by the nurse -Continue monitor temperature curve and repeat CBC in a.m. -Empirically start IV ceftriaxone  Status epilepticus in the setting of recent large left MCA stroke -No arm/eye twitching since ICU transfer -Continue current AED regimen/ Dilantin and Vimpat, transition to PO via Cortrak and po when able to swallow; Patient keeps failing his swallow studies and discussed with him and he wants to wait  until Monday to re-evaluate  Hypertension -BP this Afternoon is 138/59 -Avoid initiation of ARB in setting of AKI. -Restarted home Norovasc 5 mg on 8/23, transition all meds PO, SBP <160 -Continue with labetalol 5 mg IV every 2 as needed for high blood pressure of SBP  of greater than 180.  Urinary Retention - Continue Benechol - Removed Foley however had to be reinserted the night before last as he was retaining and had 523 mL in his bladder -Check Urinalysis and Urine Cx   AKI, improved -Improved. BUN/creatinine peaked at 29/2.12 and currently is now 20/1.15 -Avoid further nephrotoxic medications, contrast dyes, hypotension and renally adjust medications -Repeat CMP in a.m.  Diabetes type 2 with Hyperglycemia  -Continued current regimen of moderate NovoLog/scale insulin every 4 hours and may need to adjust further -CBGs ranging from 199-270  Dysphagia.  - SLP following. Deferred PEG at patient's request.  - Continue tube feeds via Cortrak - Trial of swallowing rehabilitation prior to PEG tube. If he fails Monday will pursue a PEG tube at that time -SLP to re-evaluate and he continues to fail -In the interim will consult IR for evaluation for PEG early next week if he continues to fail  Normocytic Anemia -Patient's hemoglobin/hematocrit is stable at 9.2/27.7 and dropped slightly to 8.7/26.7 -Check anemia panel in the a.m. next-continue to monitor for signs and symptoms of bleeding; currently no overt bleeding noted Repeat CBC in a.m. -We will resume VTE prophylaxis as well as his home clopidogrel  Leukocytosis -Likely reactive -The patient's WBC went from 10.0 is now 14.9 and is now  -Continue to monitor and trend and since he spiked a temperature we will panculture -Repeat CBC in a.m.  Thrombocytosis -Likely reactive -Continue to monitor and trend -Patient's platelet count has gone from 460 -> 529 -> 478  Hx of Left MCA stroke with residual aphasia and right hemiparesis  -Continue with aspirin 81 mg p.o. daily, atorvastatin 80 mg p.o. nightly, and clopidogrel 75 mg via Cortrak -We will obtain PT OT to further evaluate and treat and he will need SNF  Right arm swelling  -The setting of dependent edema but also has diagnosed SVT in  the cephalic and basilic veins; Repeat Duplex continues to show SVTs -Likely the cause of his swelling is his SVTs -Continue with OT  DVT prophylaxis: Enoxaparin 40 mg sq q24h Code Status: FULL CODE  Family Communication: Discussed with Brother at bedside  Disposition Plan: SNF vs CIR  Consultants:   PCCM Transfer  Neurology  Procedures:  8/8 ETT 8/14 ETT 08/19 Tracheostomy  8/23 PEG tube  Significant Diagnostic Tests:  8/8 CT head>>No acute intracranial abnormality. Large area of encephalomalacia throughout the left MCA territory, consistent with remote infarct. 8/8 CXR>>lungs clear 8/8 UDS>>+ benzo (but given versed in ED); o/w negative 8/8 MRI of brain>>large territory chronic hemorrhagic infarct in the left frontal lobe.Ill-defined hyperintensity throughout the left basal ganglia and left thalamus with mild enhancement in dobutamine likely secondary to encephalitis, infiltrating tumor or atypical subacute infarction 8/8 MRI of face and trigeminal region>>rules out soft tissue abscess 08/17 CXR>>improved aeration to the right lung base  8/17 CT Abdomen wo Contrast >>cholelithiasis/trace ascites/aorthic atherosclerosis   Micro Data:  8/8 Sars-CoV-2>>negative 8/8 BCx2>> no growth 8/8 UC>>Klebsiella pneumonia 08/14 BCx>> negative 08/14 Trach Cx>> gram positive rods and cocci in pairs 08/17 Trach Cx>> negative    Antimicrobials:  Anti-infectives (From admission, onward)   Start     Dose/Rate Route Frequency Ordered Stop  10/30/19 1400  cefTRIAXone (ROCEPHIN) 2 g in sodium chloride 0.9 % 100 mL IVPB        2 g 200 mL/hr over 30 Minutes Intravenous Every 24 hours 10/30/19 1251     10/18/19 2300  vancomycin (VANCOREADY) IVPB 750 mg/150 mL  Status:  Discontinued        750 mg 150 mL/hr over 60 Minutes Intravenous Every 24 hours 10/18/19 1359 10/19/19 1217   10/18/19 0200  vancomycin (VANCOREADY) IVPB 500 mg/100 mL  Status:  Discontinued        500 mg 100  mL/hr over 60 Minutes Intravenous Every 12 hours 10/17/19 1242 10/18/19 1359   10/17/19 1245  vancomycin (VANCOCIN) IVPB 1000 mg/200 mL premix        1,000 mg 200 mL/hr over 60 Minutes Intravenous  Once 10/17/19 1242 10/17/19 1441   10/17/19 1245  piperacillin-tazobactam (ZOSYN) IVPB 3.375 g        3.375 g 12.5 mL/hr over 240 Minutes Intravenous Every 8 hours 10/17/19 1242 10/24/19 2359   10/17/19 0900  ampicillin-sulbactam (UNASYN) 1.5 g in sodium chloride 0.9 % 100 mL IVPB  Status:  Discontinued        1.5 g 200 mL/hr over 30 Minutes Intravenous Every 6 hours 10/17/19 0809 10/17/19 0850   10/17/19 0900  Ampicillin-Sulbactam (UNASYN) 3 g in sodium chloride 0.9 % 100 mL IVPB  Status:  Discontinued        3 g 200 mL/hr over 30 Minutes Intravenous Every 6 hours 10/17/19 0850 10/17/19 1242   10/14/19 0600  vancomycin (VANCOREADY) IVPB 500 mg/100 mL  Status:  Discontinued        500 mg 100 mL/hr over 60 Minutes Intravenous Every 12 hours 10/14/19 0057 10/16/19 1534   10/12/19 1600  ampicillin (OMNIPEN) 2 g in sodium chloride 0.9 % 100 mL IVPB  Status:  Discontinued        2 g 300 mL/hr over 20 Minutes Intravenous Every 6 hours 10/12/19 1400 10/14/19 0934   10/12/19 0000  vancomycin (VANCOREADY) IVPB 750 mg/150 mL  Status:  Discontinued        750 mg 150 mL/hr over 60 Minutes Intravenous Every 12 hours 10/11/19 1254 10/14/19 0046   10/11/19 1230  cefTRIAXone (ROCEPHIN) 2 g in sodium chloride 0.9 % 100 mL IVPB  Status:  Discontinued        2 g 200 mL/hr over 30 Minutes Intravenous Every 12 hours 10/11/19 1133 10/16/19 1534   10/11/19 1230  ampicillin (OMNIPEN) 2 g in sodium chloride 0.9 % 100 mL IVPB  Status:  Discontinued        2 g 300 mL/hr over 20 Minutes Intravenous Every 4 hours 10/11/19 1133 10/12/19 1400   10/11/19 1200  vancomycin (VANCOREADY) IVPB 1500 mg/300 mL        1,500 mg 150 mL/hr over 120 Minutes Intravenous  Once 10/11/19 1135 10/11/19 1419   10/11/19 1145  vancomycin  (VANCOCIN) IVPB 1000 mg/200 mL premix  Status:  Discontinued        1,000 mg 200 mL/hr over 60 Minutes Intravenous  Once 10/11/19 1133 10/11/19 1135      Subjective: Seen and examined at bedside and he continues to be aphasic but states his head yes or no.  Does not want a PEG tube placement at this time and I spoke to him about reevaluating his speech on Monday and he is agreeable.  If he feels on Monday he will be agreeable to getting a PEG  tube placement and.  PT OT to evaluate and treat.  Today he spiked a temperature and we will panculture and start him on empiric antibiotics.  Objective: Vitals:   10/30/19 1145 10/30/19 1238 10/30/19 1329 10/30/19 1536  BP: (!) 150/65   (!) 138/59  Pulse: 89   80  Resp: _0 Temp: (!) 101.9 F (38.8 C)  99.6 F (37.6 C) 98.6 F (37 C)  TempSrc: Oral  Oral Oral  SpO2: 97%   96%  Weight:      Height:        Intake/Output Summary (Last 24 hours) at 10/30/2019 1810 Last data filed at 10/30/2019 1541 Gross per 24 hour  Intake --  Output 1850 ml  Net -1850 ml   Filed Weights   10/23/19 0500 10/24/19 0500 10/28/19 0500  Weight: 81.9 kg 81.8 kg 73.2 kg   Examination: Physical Exam:  Constitutional: The patient is a thin chronically ill-appearing Caucasian male in no acute distress Eyes: Lids and conjunctivae normal, sclerae anicteric  ENMT: External Ears, Nose appear normal. Grossly normal hearing.  Core track in place and the right nare Neck:  has a tracheostomy with trach collar Respiratory: Diminished to auscultation bilaterally with coarse breath sounds and some crackles, no wheezing, rales, rhonchi or crackles. Normal respiratory effort and patient is not tachypenic. No accessory muscle use.  Wearing supplemental oxygen via trach collar Cardiovascular: RRR, no murmurs / rubs / gallops. S1 and S2 auscultated.  As viral upper extremity edema Abdomen: Soft, non-tender, non-distended. Bowel sounds positive.  GU:  Deferred. Musculoskeletal: No clubbing / cyanosis of digits/nails. Normal strength and muscle tone.  Skin: No rashes, lesions, ulcers. No induration; Warm and dry.  Neurologic: Has hemiplegia and aphasia Psychiatric: Normal judgment and insight. Alert and oriented x 3. Normal mood and appropriate affect.   Data Reviewed: I have personally reviewed following labs and imaging studies  CBC: Recent Labs  Lab 10/26/19 0500 10/27/19 0427 10/28/19 0812 10/29/19 0924 10/30/19 0429  WBC 8.4 9.2 10.0 14.9* 14.5*  NEUTROABS  --   --   --  12.7* 12.2*  HGB 8.3* 8.3* 8.8* 9.2* 8.7*  HCT 25.6* 25.4* 26.7* 27.7* 26.7*  MCV 90.8 90.4 89.9 91.7 92.7  PLT 441* 460* 495* 529* 419*   Basic Metabolic Panel: Recent Labs  Lab 10/26/19 0500 10/26/19 0500 10/27/19 0427 10/27/19 1422 10/28/19 0812 10/29/19 0456 10/29/19 0924 10/30/19 0429  NA 139  --  140  --  138 138  --  136  K 3.3*   < > 3.0* 3.7 3.6 3.9  --  3.6  CL 106  --  107  --  104 102  --  102  CO2 22  --  21*  --  23 26  --  26  GLUCOSE 117*  --  136*  --  206* 229*  --  209*  BUN 11  --  9  --  13 16  --  20  CREATININE 1.24  --  1.10  --  1.09 1.13  --  1.15  CALCIUM 8.1*  --  7.6*  --  8.2* 8.5*  --  8.7*  MG  --   --   --   --  1.9  --  1.8 1.8  PHOS  --   --   --   --   --   --  3.5 3.7   < > = values in this interval not displayed.   GFR:  Estimated Creatinine Clearance: 71.6 mL/min (by C-G formula based on SCr of 1.15 mg/dL). Liver Function Tests: Recent Labs  Lab 10/26/19 0500 10/27/19 0427 10/28/19 0812 10/29/19 0456 10/30/19 0429  AST 40 36 39 37 41  ALT 58* 45* 45* 45* 46*  ALKPHOS 126 114 134* 137* 147*  BILITOT 0.5 0.4 0.3 0.3 0.3  PROT 5.9* 5.5* 6.1* 6.5 6.8  ALBUMIN 2.0* 1.9* 2.2* 2.3* 2.4*   No results for input(s): LIPASE, AMYLASE in the last 168 hours. No results for input(s): AMMONIA in the last 168 hours. Coagulation Profile: No results for input(s): INR, PROTIME in the last 168 hours. Cardiac  Enzymes: No results for input(s): CKTOTAL, CKMB, CKMBINDEX, TROPONINI in the last 168 hours. BNP (last 3 results) No results for input(s): PROBNP in the last 8760 hours. HbA1C: No results for input(s): HGBA1C in the last 72 hours. CBG: Recent Labs  Lab 10/29/19 2352 10/30/19 0402 10/30/19 0815 10/30/19 1156 10/30/19 1610  GLUCAP 260* 203* 199* 251* 270*   Lipid Profile: No results for input(s): CHOL, HDL, LDLCALC, TRIG, CHOLHDL, LDLDIRECT in the last 72 hours. Thyroid Function Tests: No results for input(s): TSH, T4TOTAL, FREET4, T3FREE, THYROIDAB in the last 72 hours. Anemia Panel: No results for input(s): VITAMINB12, FOLATE, FERRITIN, TIBC, IRON, RETICCTPCT in the last 72 hours. Sepsis Labs: Recent Labs  Lab 10/30/19 1343  PROCALCITON <0.10  LATICACIDVEN 1.2    No results found for this or any previous visit (from the past 240 hour(s)).   RN Pressure Injury Documentation:     Estimated body mass index is 21.29 kg/m as calculated from the following:   Height as of this encounter: 6' 1" (1.854 m).   Weight as of this encounter: 73.2 kg.  Malnutrition Type:  Nutrition Problem: Inadequate oral intake Etiology: inability to eat   Malnutrition Characteristics:  Signs/Symptoms: NPO status   Nutrition Interventions:  Interventions: Tube feeding    Radiology Studies: DG CHEST PORT 1 VIEW  Result Date: 10/30/2019 CLINICAL DATA:  Shortness of breath. EXAM: PORTABLE CHEST 1 VIEW COMPARISON:  Chest x-ray 10/22/2019. FINDINGS: Interim removal of left PICC line. Tracheostomy tube and feeding tube in stable position. Cardiomegaly. Diffuse bilateral interstitial prominence consistent with interstitial edema and or pneumonitis. Small right pleural effusion. IMPRESSION: 1. Interim removal of left PICC line. Tracheostomy tube and feeding tube in stable position. 2.  Cardiomegaly. 3. Diffuse bilateral interstitial prominence noted on today's exam. Small right pleural effusion  noted on today's exam. CHF could present in this fashion. Pneumonitis cannot be excluded. Electronically Signed   By: Marcello Moores  Register   On: 10/30/2019 07:56   VAS [place] UPPER EXTREMITY VENOUS DUPLEX  Result Date: 10/30/2019 UPPER VENOUS STUDY  Indications: Swelling Comparison Study: 10-19-2019 Right Upper Extremity Venous study Performing Technologist: Darlin Coco  Examination Guidelines: A complete evaluation includes B-mode imaging, spectral Doppler, color Doppler, and power Doppler as needed of all accessible portions of each vessel. Bilateral testing is considered an integral part of a complete examination. Limited examinations for reoccurring indications may be performed as noted.  Right Findings: +----------+------------+---------+-----------+----------+-----------------+ RIGHT     CompressiblePhasicitySpontaneousProperties     Summary      +----------+------------+---------+-----------+----------+-----------------+ IJV           Full       Yes       Yes                                +----------+------------+---------+-----------+----------+-----------------+  Subclavian    Full       Yes       Yes                                +----------+------------+---------+-----------+----------+-----------------+ Axillary      Full       Yes       Yes                                +----------+------------+---------+-----------+----------+-----------------+ Brachial      Full       Yes       Yes                                +----------+------------+---------+-----------+----------+-----------------+ Radial        Full       Yes       Yes                                +----------+------------+---------+-----------+----------+-----------------+ Ulnar         Full       Yes       Yes                                +----------+------------+---------+-----------+----------+-----------------+ Cephalic      None       No        No               Age Indeterminate  +----------+------------+---------+-----------+----------+-----------------+ Basilic       None       No        No               Age Indeterminate +----------+------------+---------+-----------+----------+-----------------+  Left Findings: +----------+------------+---------+-----------+----------+-------+ LEFT      CompressiblePhasicitySpontaneousPropertiesSummary +----------+------------+---------+-----------+----------+-------+ Subclavian    Full       Yes       Yes                      +----------+------------+---------+-----------+----------+-------+  Summary:  Right: No evidence of deep vein thrombosis in the upper extremity. Findings consistent with acute superficial vein thrombosis involving the right basilic vein and right cephalic vein.  Left: No evidence of thrombosis in the subclavian.  *See table(s) above for measurements and observations.  Diagnosing physician: Servando Snare MD Electronically signed by Servando Snare MD on 10/30/2019 at 11:19:54 AM.    Final      Scheduled Meds: . amLODipine  10 mg Per Tube Daily  . aspirin  81 mg Per Tube Daily  . atorvastatin  80 mg Per Tube Daily  . bethanechol  15 mg Per Tube TID  . chlorhexidine gluconate (MEDLINE KIT)  15 mL Mouth Rinse BID  . Chlorhexidine Gluconate Cloth  6 each Topical Daily  . clopidogrel  75 mg Per Tube Daily  . enoxaparin (LOVENOX) injection  40 mg Subcutaneous Q24H  . feeding supplement (PROSource TF)  45 mL Per Tube BID  . Gerhardt's butt cream   Topical Daily  . insulin aspart  0-15 Units Subcutaneous Q4H  . lacosamide  150 mg Per Tube BID  . levETIRAcetam  1,000 mg Per Tube BID  .  mouth rinse  15 mL Mouth Rinse q12n4p  . phenytoin  100 mg Per Tube TID  . QUEtiapine  50 mg Per Tube QHS  . sennosides  5 mL Per Tube BID   Continuous Infusions: . sodium chloride Stopped (10/15/19 1834)  . cefTRIAXone (ROCEPHIN)  IV 2 g (10/30/19 1458)  . feeding supplement (OSMOLITE 1.5 CAL) 1,000 mL (10/30/19 0749)      LOS: 19 days   Kerney Elbe, DO [facility] PAGER is on [place]  If 7PM-7AM, please contact night-coverage [email or web address]

## 2019-10-30 NOTE — Progress Notes (Signed)
Upper extremity venous RT study completed.   Please see CV Proc for preliminary results.   Corey Bennett  

## 2019-10-30 NOTE — Progress Notes (Signed)
RT protocol assessment complete at this time. Noticed that there were q6 neb orders, IS and flutter ordered. Patient unable to do IS and flutter as he is a trach patient, so those orders were discontinued. Also changed nebs back to q6 prn as no wheezing noted. Will monitor q4 with trach checks for need.

## 2019-10-31 ENCOUNTER — Inpatient Hospital Stay (HOSPITAL_COMMUNITY): Payer: BC Managed Care – PPO

## 2019-10-31 DIAGNOSIS — R945 Abnormal results of liver function studies: Secondary | ICD-10-CM

## 2019-10-31 LAB — GLUCOSE, CAPILLARY
Glucose-Capillary: 159 mg/dL — ABNORMAL HIGH (ref 70–99)
Glucose-Capillary: 172 mg/dL — ABNORMAL HIGH (ref 70–99)
Glucose-Capillary: 215 mg/dL — ABNORMAL HIGH (ref 70–99)
Glucose-Capillary: 238 mg/dL — ABNORMAL HIGH (ref 70–99)
Glucose-Capillary: 247 mg/dL — ABNORMAL HIGH (ref 70–99)
Glucose-Capillary: 248 mg/dL — ABNORMAL HIGH (ref 70–99)
Glucose-Capillary: 287 mg/dL — ABNORMAL HIGH (ref 70–99)

## 2019-10-31 LAB — COMPREHENSIVE METABOLIC PANEL
ALT: 74 U/L — ABNORMAL HIGH (ref 0–44)
AST: 80 U/L — ABNORMAL HIGH (ref 15–41)
Albumin: 2.2 g/dL — ABNORMAL LOW (ref 3.5–5.0)
Alkaline Phosphatase: 148 U/L — ABNORMAL HIGH (ref 38–126)
Anion gap: 12 (ref 5–15)
BUN: 19 mg/dL (ref 6–20)
CO2: 26 mmol/L (ref 22–32)
Calcium: 8.7 mg/dL — ABNORMAL LOW (ref 8.9–10.3)
Chloride: 99 mmol/L (ref 98–111)
Creatinine, Ser: 1.26 mg/dL — ABNORMAL HIGH (ref 0.61–1.24)
GFR calc Af Amer: >60 mL/min (ref >60)
GFR calc non Af Amer: >60 mL/min (ref >60)
Glucose, Bld: 222 mg/dL — ABNORMAL HIGH (ref 70–99)
Potassium: 3.6 mmol/L (ref 3.5–5.1)
Sodium: 137 mmol/L (ref 135–145)
Total Bilirubin: 0.3 mg/dL (ref 0.3–1.2)
Total Protein: 6.6 g/dL (ref 6.5–8.1)

## 2019-10-31 LAB — CBC WITH DIFFERENTIAL/PLATELET
Abs Immature Granulocytes: 0.06 10*3/uL (ref 0.00–0.07)
Basophils Absolute: 0.1 10*3/uL (ref 0.0–0.1)
Basophils Relative: 0 %
Eosinophils Absolute: 0.3 10*3/uL (ref 0.0–0.5)
Eosinophils Relative: 2 %
HCT: 25.9 % — ABNORMAL LOW (ref 39.0–52.0)
Hemoglobin: 8.7 g/dL — ABNORMAL LOW (ref 13.0–17.0)
Immature Granulocytes: 0 %
Lymphocytes Relative: 9 %
Lymphs Abs: 1.3 10*3/uL (ref 0.7–4.0)
MCH: 30.9 pg (ref 26.0–34.0)
MCHC: 33.6 g/dL (ref 30.0–36.0)
MCV: 91.8 fL (ref 80.0–100.0)
Monocytes Absolute: 0.8 10*3/uL (ref 0.1–1.0)
Monocytes Relative: 5 %
Neutro Abs: 11.7 10*3/uL — ABNORMAL HIGH (ref 1.7–7.7)
Neutrophils Relative %: 84 %
Platelets: 454 10*3/uL — ABNORMAL HIGH (ref 150–400)
RBC: 2.82 MIL/uL — ABNORMAL LOW (ref 4.22–5.81)
RDW: 14.9 % (ref 11.5–15.5)
WBC: 14.2 10*3/uL — ABNORMAL HIGH (ref 4.0–10.5)
nRBC: 0 % (ref 0.0–0.2)

## 2019-10-31 LAB — MAGNESIUM: Magnesium: 1.8 mg/dL (ref 1.7–2.4)

## 2019-10-31 LAB — PHOSPHORUS: Phosphorus: 4.1 mg/dL (ref 2.5–4.6)

## 2019-10-31 LAB — PROCALCITONIN: Procalcitonin: <0.1 ng/mL

## 2019-10-31 IMAGING — DX DG CHEST 1V PORT
1 series · 1 of 1 positions shown · non-contrast
Comparison: Portable exam [QE] hours compared to [DATE]

CLINICAL DATA: Shortness of breath

EXAM:
PORTABLE CHEST 1 VIEW

[chest ap]
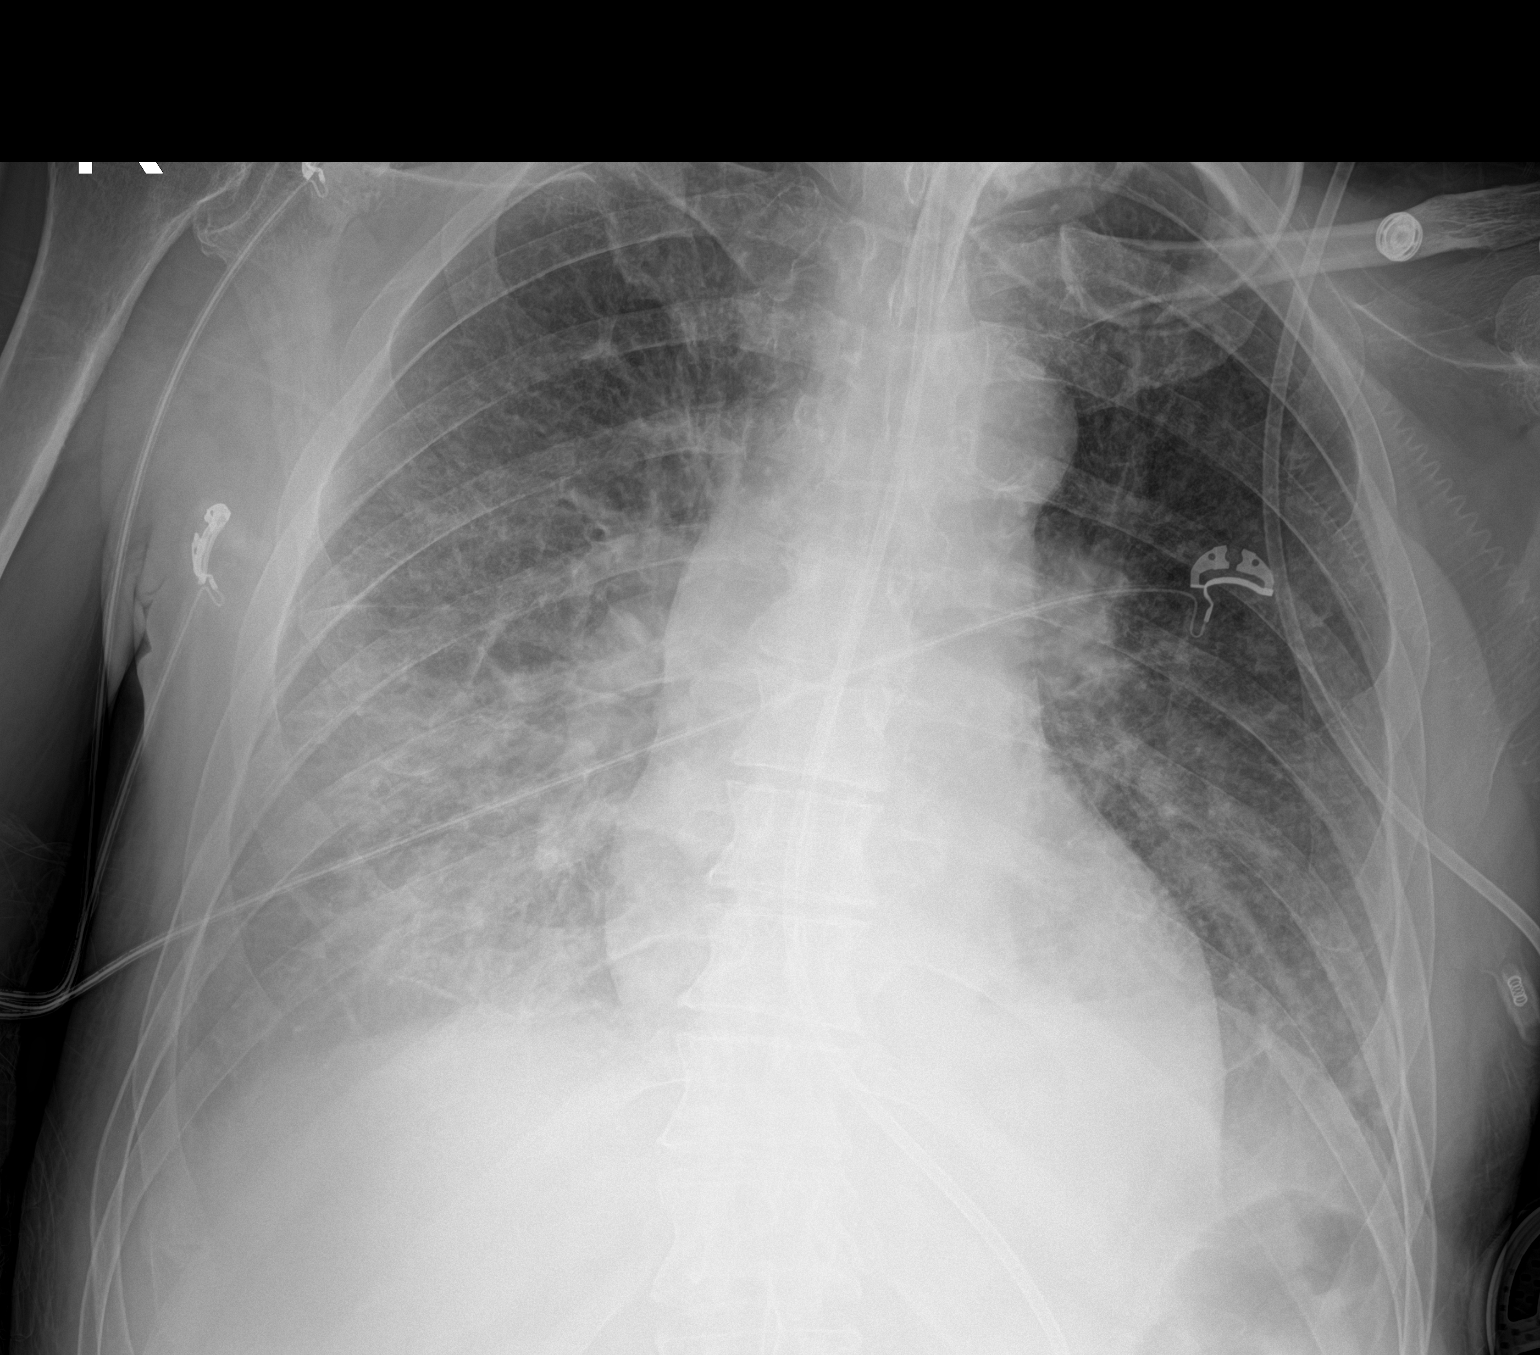

[1 of 1 positions shown; findings below may reference images not displayed]

FINDINGS: Feeding tube extends into stomach.

Tracheostomy tube stable.

Normal heart size, mediastinal contours, and pulmonary vascularity.

Atherosclerotic calcification aorta.

BILATERAL pulmonary infiltrates increased since previous exam.

Potential layered RIGHT pleural effusion.

No pneumothorax or acute osseous findings.
IMPRESSION: BILATERAL pulmonary infiltrates slightly greater on RIGHT, increased
since previous exam; this could represent pneumonia or less likely
asymmetric edema.

Questionable layered RIGHT pleural effusion.

## 2019-10-31 IMAGING — US US ABDOMEN LIMITED
1 series · 14 of 25 positions shown · non-contrast
Comparison: CT abdomen pelvis dated [DATE].

CLINICAL DATA: 59-year-old male with abnormal LFT.

EXAM:
ULTRASOUND ABDOMEN LIMITED RIGHT UPPER QUADRANT

[Series 1: us abdomen limited ruq · 14 of 26 slices shown]
[im 1/26]
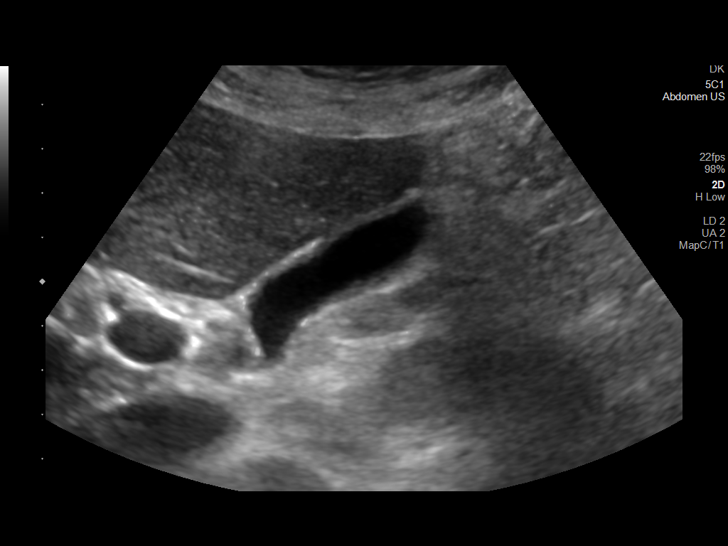
[im 3/26]
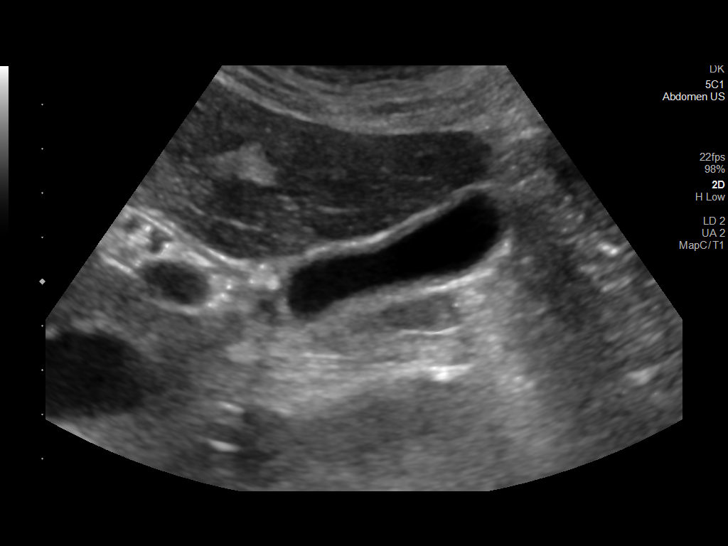
[im 5/26]
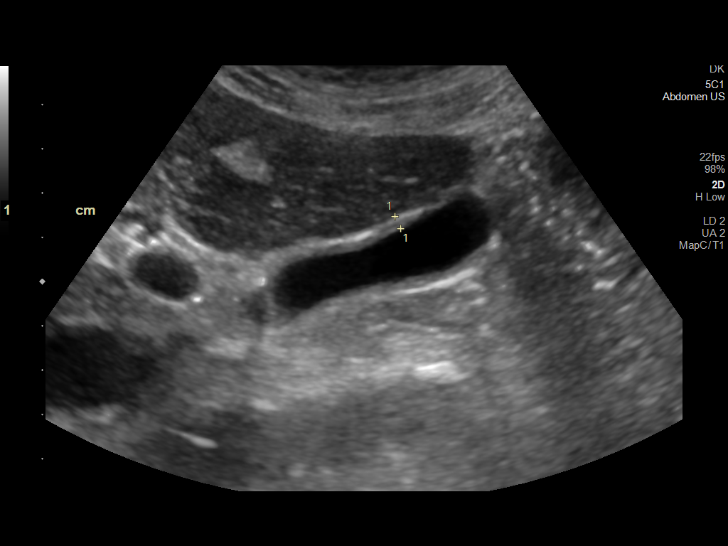
[im 7/26]
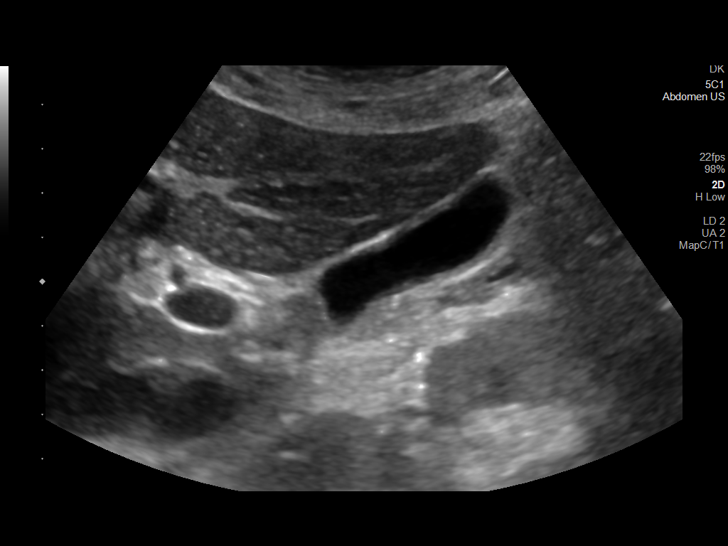
[im 9/26]
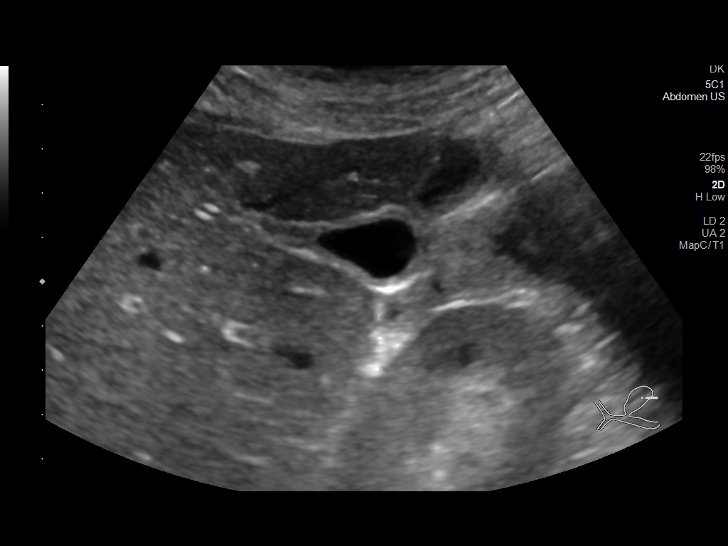
[im 10/26]
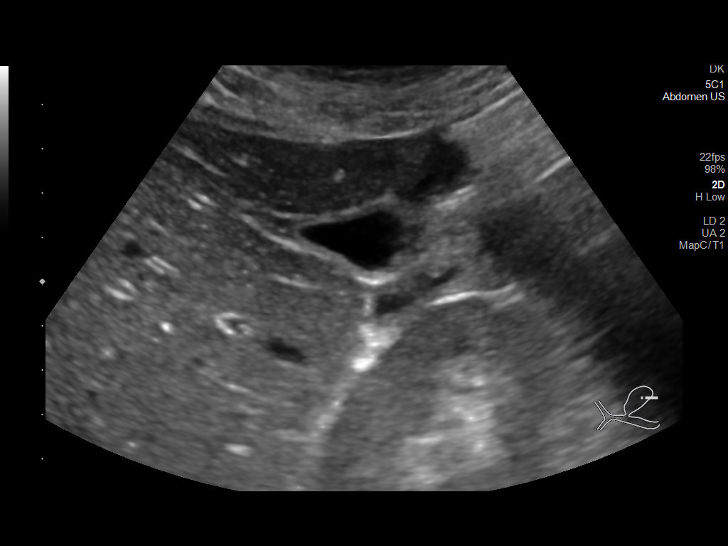
[im 12/26]
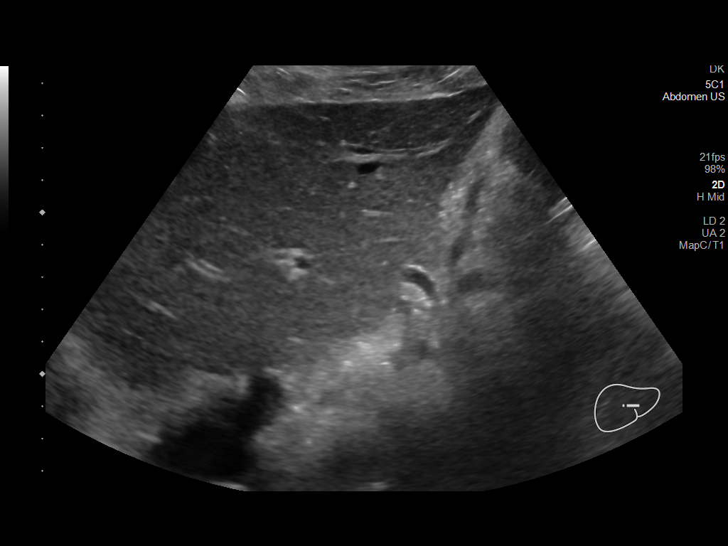
[im 14/26]
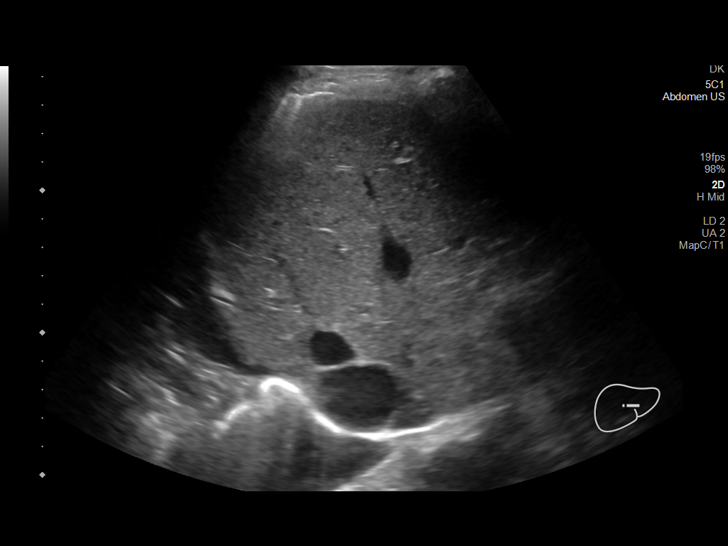
[im 16/26]
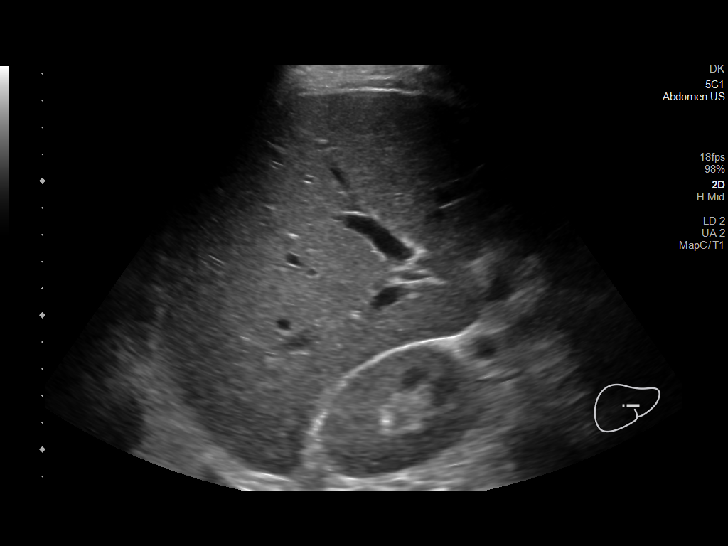
[im 17/26]
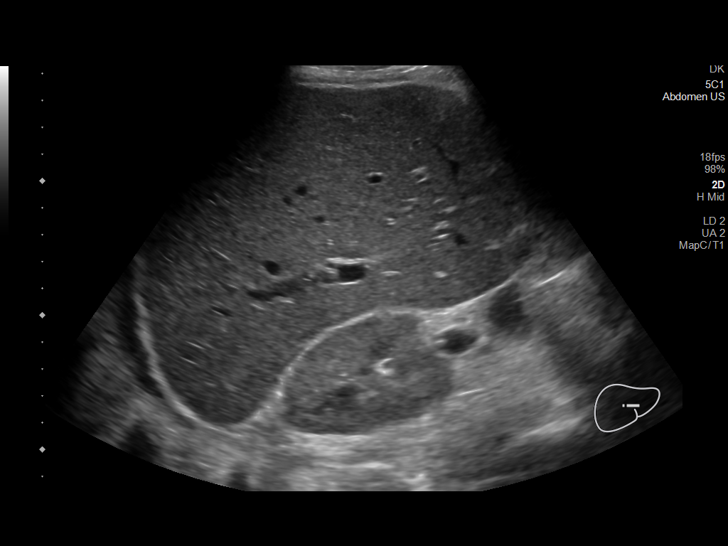
[im 19/26]
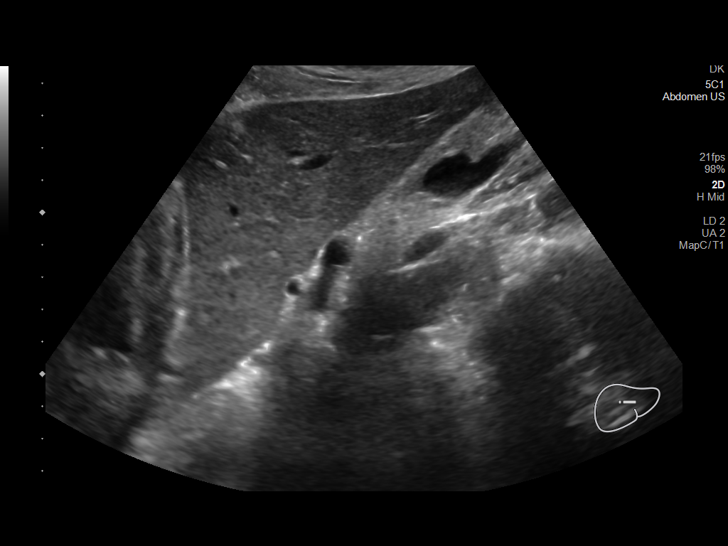
[im 21/26]
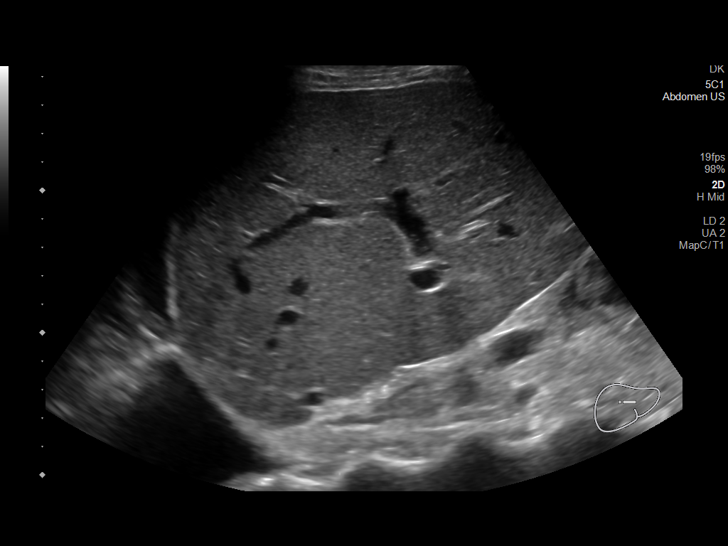
[im 23/26]
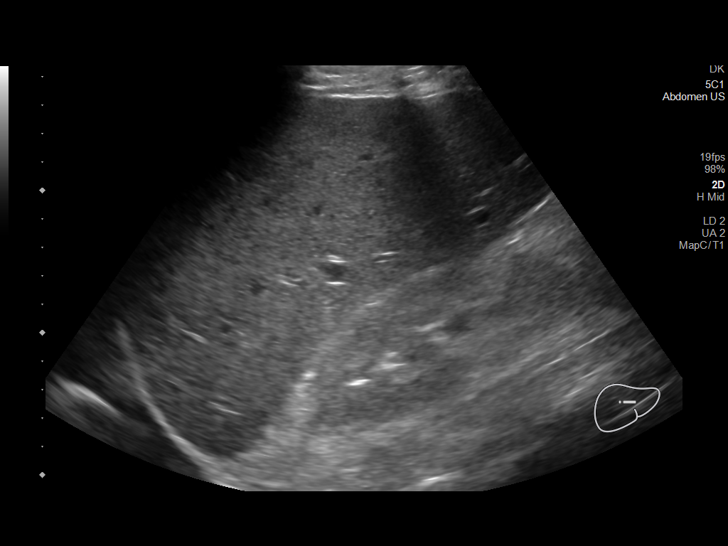
[im 26/26]
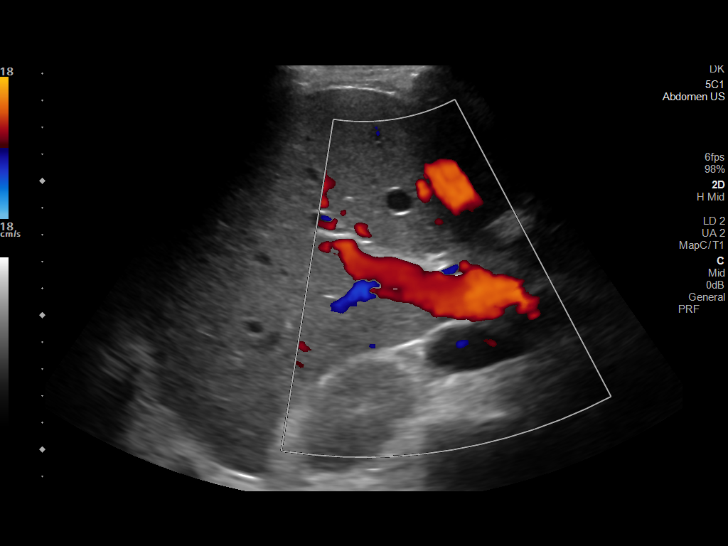

[14 of 25 positions shown; findings below may reference images not displayed]

FINDINGS: Gallbladder:

No gallstones or wall thickening visualized. No sonographic Murphy
sign noted by sonographer.

Common bile duct:

Diameter: 4 mm

Liver:

No focal lesion identified. Within normal limits in parenchymal
echogenicity. Portal vein is patent on color Doppler imaging with
normal direction of blood flow towards the liver.

Other: Partially visualized small right pleural effusion.
IMPRESSION: 1. Unremarkable right upper quadrant.
2. Small right pleural effusion.

## 2019-10-31 MED ORDER — INSULIN GLARGINE 100 UNIT/ML ~~LOC~~ SOLN
12.0000 [IU] | Freq: Every day | SUBCUTANEOUS | Status: DC
  Administered 2019-10-31 – 2019-11-16 (×17): 12 [IU] via SUBCUTANEOUS
  Filled 2019-10-31 (×18): qty 0.12

## 2019-10-31 NOTE — Plan of Care (Signed)
Problem: Activity: Goal: Ability to tolerate increased activity will improve Outcome: Progressing   Problem: Respiratory: Goal: Ability to maintain a clear airway and adequate ventilation will improve Outcome: Progressing   Problem: Role Relationship: Goal: Method of communication will improve Outcome: Progressing   Problem: Education: Goal: Knowledge of General Education information will improve Description: Including pain rating scale, medication(s)/side effects and non-pharmacologic comfort measures 10/31/2019 0332 by Ronalee Red, RN Outcome: Progressing 10/31/2019 0332 by Ronalee Red, RN Outcome: Progressing 10/31/2019 0331 by Ronalee Red, RN Outcome: Progressing   Problem: Health Behavior/Discharge Planning: Goal: Ability to manage health-related needs will improve 10/31/2019 0332 by Ronalee Red, RN Outcome: Progressing 10/31/2019 0332 by Ronalee Red, RN Outcome: Progressing 10/31/2019 0331 by Ronalee Red, RN Outcome: Progressing   Problem: Clinical Measurements: Goal: Ability to maintain clinical measurements within normal limits will improve 10/31/2019 0332 by Ronalee Red, RN Outcome: Progressing 10/31/2019 0332 by Ronalee Red, RN Outcome: Progressing 10/31/2019 0331 by Ronalee Red, RN Outcome: Progressing Goal: Will remain free from infection 10/31/2019 0332 by Ronalee Red, RN Outcome: Progressing 10/31/2019 0332 by Ronalee Red, RN Outcome: Progressing 10/31/2019 0331 by Ronalee Red, RN Outcome: Progressing Goal: Diagnostic test results will improve 10/31/2019 0332 by Ronalee Red, RN Outcome: Progressing 10/31/2019 0332 by Ronalee Red, RN Outcome: Progressing 10/31/2019 0331 by Ronalee Red, RN Outcome: Progressing Goal: Respiratory complications will improve 10/31/2019 0332 by Ronalee Red, RN Outcome: Progressing 10/31/2019 0332 by Ronalee Red, RN Outcome: Progressing 10/31/2019 0331 by Ronalee Red, RN Outcome:  Progressing Goal: Cardiovascular complication will be avoided 10/31/2019 0332 by Ronalee Red, RN Outcome: Progressing 10/31/2019 0332 by Ronalee Red, RN Outcome: Progressing 10/31/2019 0331 by Ronalee Red, RN Outcome: Progressing   Problem: Activity: Goal: Risk for activity intolerance will decrease 10/31/2019 0332 by Ronalee Red, RN Outcome: Progressing 10/31/2019 0332 by Ronalee Red, RN Outcome: Progressing 10/31/2019 0331 by Ronalee Red, RN Outcome: Progressing   Problem: Nutrition: Goal: Adequate nutrition will be maintained 10/31/2019 0332 by Ronalee Red, RN Outcome: Progressing 10/31/2019 0332 by Ronalee Red, RN Outcome: Progressing 10/31/2019 0331 by Ronalee Red, RN Outcome: Progressing   Problem: Coping: Goal: Level of anxiety will decrease 10/31/2019 0332 by Ronalee Red, RN Outcome: Progressing 10/31/2019 0332 by Ronalee Red, RN Outcome: Progressing 10/31/2019 0331 by Ronalee Red, RN Outcome: Progressing   Problem: Elimination: Goal: Will not experience complications related to bowel motility 10/31/2019 0332 by Ronalee Red, RN Outcome: Progressing 10/31/2019 0332 by Ronalee Red, RN Outcome: Progressing 10/31/2019 0331 by Ronalee Red, RN Outcome: Progressing Goal: Will not experience complications related to urinary retention 10/31/2019 0332 by Ronalee Red, RN Outcome: Progressing 10/31/2019 0332 by Ronalee Red, RN Outcome: Progressing 10/31/2019 0331 by Ronalee Red, RN Outcome: Progressing   Problem: Pain Managment: Goal: General experience of comfort will improve 10/31/2019 0332 by Ronalee Red, RN Outcome: Progressing 10/31/2019 0332 by Ronalee Red, RN Outcome: Progressing 10/31/2019 0331 by Ronalee Red, RN Outcome: Progressing   Problem: Safety: Goal: Ability to remain free from injury will improve 10/31/2019 0332 by Ronalee Red, RN Outcome: Progressing 10/31/2019 0332 by Ronalee Red,  RN Outcome: Progressing 10/31/2019 0331 by Ronalee Red, RN Outcome: Progressing   Problem: Skin Integrity: Goal: Risk for impaired skin integrity will decrease 10/31/2019 0332 by Ronalee Red, RN Outcome: Progressing 10/31/2019  5102 by Ronalee Red, RN Outcome: Progressing 10/31/2019 0331 by Ronalee Red, RN Outcome: Progressing   Problem: Education: Goal: Expressions of having a comfortable level of knowledge regarding the disease process will increase 10/31/2019 0332 by Ronalee Red, RN Outcome: Progressing 10/31/2019 0332 by Ronalee Red, RN Outcome: Progressing   Problem: Coping: Goal: Ability to adjust to condition or change in health will improve 10/31/2019 0332 by Ronalee Red, RN Outcome: Progressing 10/31/2019 0332 by Ronalee Red, RN Outcome: Progressing Goal: Ability to identify appropriate support needs will improve 10/31/2019 0332 by Ronalee Red, RN Outcome: Progressing 10/31/2019 0332 by Ronalee Red, RN Outcome: Progressing   Problem: Health Behavior/Discharge Planning: Goal: Compliance with prescribed medication regimen will improve 10/31/2019 0332 by Ronalee Red, RN Outcome: Progressing 10/31/2019 0332 by Ronalee Red, RN Outcome: Progressing   Problem: Medication: Goal: Risk for medication side effects will decrease 10/31/2019 0332 by Ronalee Red, RN Outcome: Progressing 10/31/2019 0332 by Ronalee Red, RN Outcome: Progressing   Problem: Clinical Measurements: Goal: Complications related to the disease process, condition or treatment will be avoided or minimized 10/31/2019 0332 by Ronalee Red, RN Outcome: Progressing 10/31/2019 0332 by Ronalee Red, RN Outcome: Progressing Goal: Diagnostic test results will improve 10/31/2019 0332 by Ronalee Red, RN Outcome: Progressing 10/31/2019 0332 by Ronalee Red, RN Outcome: Progressing   Problem: Safety: Goal: Verbalization of understanding the information provided will  improve 10/31/2019 0332 by Ronalee Red, RN Outcome: Progressing 10/31/2019 0332 by Ronalee Red, RN Outcome: Progressing   Problem: Self-Concept: Goal: Level of anxiety will decrease 10/31/2019 0332 by Ronalee Red, RN Outcome: Progressing 10/31/2019 0332 by Ronalee Red, RN Outcome: Progressing Goal: Ability to verbalize feelings about condition will improve 10/31/2019 0332 by Ronalee Red, RN Outcome: Progressing 10/31/2019 0332 by Ronalee Red, RN Outcome: Progressing

## 2019-10-31 NOTE — Progress Notes (Signed)
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 Patient is living in a assisted living facility and can ambulate by himself. Patient was found by staff to have a right sided twitching with right eye deviation. He was given Versed and Dilantin and was intubated for airway protection.  Patient was extubated on 8/11 and his seizures was controlled with Keppra, phenytoin, and Vimpat.He was transferred to [address] for medical management  On 8/14,patient continued to have seizure with twitching of the right upper extremity. 2 mg of lorazepam was given. Patient continued to desat in the 80s on 15 L of nonrebreather mask with altered mental status. Patient was re intubated to protect airway and transferred to the ICU. On 10/22/2019 he had a regular tracheostomy and 8/23 was going to get a PEG tube placement but this was deferred to his request and he ended up getting a Cortrak placement and is currently getting tube feeds.  Currently he has failed his swallowing significantly and SLP is following closely.  We will continue to follow swallowing rehabilitation prior to PEG tube placement as SLP recommend strict n.p.o.  On 8/27/21he failed his swallow study again and I discussed with him about PEG tube placement and he wants to wait the weekend and if he feels he can monitor that he will likely be agreeable.  10/31/19 Started coughing significantly and trying to cough up some sputum.  Nurse was in the return to suction him.  His LFTs were abnormal so discussed with him about obtaining ultrasound and he is agreeable.  Given a dose of Lasix yesterday.  Assessment & Plan:   Principal Problem:   Status epilepticus  ([place]) Active Problems:   Carotid artery stenosis   Seizure (HCC)   CVA (cerebrovascular accident due to intracerebral hemorrhage) (HCC)/ left frontal lobe   AKI (acute kidney injury) ([place])   Essential hypertension   Type 2 diabetes mellitus with hyperlipidemia (HCC)  Chronic respiratory failure with hypoxemia requiring tracheostomy tube placement and mechanical ventilatory support. -Trach collar placed 8/23, remains in place -SpO2: 97 % O2 Flow Rate (L/min): 5 L/min FiO2 (%): 28 %; Wean as tolerated  -Continue with duo nebs 3 mL nebulizer every 6 hours as needed for wheezing or shortness of breath -Speech path to reevaluate swallow and he failed again -Oral suction with tube care  -Transferred to PCU -CXR showed "Interim removal of left PICC line. Tracheostomy tube and feeding tube in stable position. Cardiomegaly. Diffuse bilateral interstitial prominence noted on today's exam. Small right pleural effusion noted on today's exam. CHF could present in this fashion. Pneumonitis cannot be excluded." -Given him a dose of IV Lasix yesterday to see if this will help his respiratory status  Fever -Unclear etiology and is 101.9 -chest x-ray as above -Patient's lactic acid level is 1.2 and procalcitonin level was less than 0.10 -He does have a leukocytosis and his WBC peaked to 14.9 and is now slowly improving and 14.2 -Check urinalysis given that he is having acute urinary retention to rule out a UTI -Blood cultures x2 ordered and showed NGTD <24 Hours -urinalysis and urine culture still pending to be drawn by the nurse -Continue monitor temperature curve and repeat CBC in a.m. -Empirically start IV ceftriaxone  Status epilepticus in the setting of recent large left MCA stroke -No arm/eye twitching since ICU transfer -Continue current AED regimen/ Dilantin and Vimpat, transition to PO via Cortrak and po when able to swallow; Patient keeps failing his swallow studies and discussed with him  and he wants to wait until Monday to re-evaluate  Hypertension -BP this Afternoon is 161/74 -Avoid initiation of ARB in setting of AKI. -Restarted home Norovasc 5 mg on 8/23, transition all meds PO, SBP <160 -Continue with labetalol 5 mg IV every 2 as needed for high blood pressure of SBP of greater than 180.  Urinary Retention - Continue Benechol - Removed Foley however had to be reinserted the night before last as he was retaining and had 523 mL in his bladder -Check Urinalysis and Urine Cx AND STILL NOT SENT -Will need to send   AKI, improved -Improved. BUN/creatinine peaked at 29/2.12 and currently is now 20/1.15 yesterday and slightly worsened in the setting of IV Lasix and is now 19/1.26 -Avoid further nephrotoxic medications, contrast dyes, hypotension and renally adjust medications -Repeat CMP in a.m.  Diabetes type 2 with Hyperglycemia  -Continued current regimen of moderate NovoLog/scale insulin every 4 hours and may need to adjust further -CBGs ranging from 215-287 -Will resume Lantus 12 unis sq qHS  Dysphagia.  - SLP following. Deferred PEG at patient's request.  - Continue tube feeds via Cortrak - Trial of swallowing rehabilitation prior to PEG tube. If he fails Monday will pursue a PEG tube at that time -SLP to re-evaluate and he continues to fail -In the interim will consult IR for evaluation for PEG early next week if he continues to fail -Currently has a Flexical from Diarrhea from TF  Normocytic Anemia -Patient's hemoglobin/hematocrit is stable at 9.2/27.7 and dropped slightly to 8.7/26.7 yesterday and today is 8.7/25.9 -Check anemia panel in the a.m. next-continue to monitor for signs and symptoms of bleeding; currently no overt bleeding noted Repeat CBC in a.m. -We will resume VTE prophylaxis as well as his home clopidogrel  Leukocytosis -Likely reactive -The patient's WBC went from 10.0 is now 14.9 and is now 14.2 -Continue to monitor and trend and  since he spiked a temperature we will panculture -Repeat CBC in a.m.  Thrombocytosis -Likely reactive -Continue to monitor and trend -Patient's platelet count has gone from 460 -> 529 -> 478 -> 454  Hx of Left MCA stroke with residual aphasia and right hemiparesis  -Continue with aspirin 81 mg p.o. daily, atorvastatin 80 mg p.o. nightly, and clopidogrel 75 mg via Cortrak -We will obtain PT OT to further evaluate and treat and he will need SNF  Right arm swelling  -The setting of dependent edema but also has diagnosed SVT in the cephalic and basilic veins; Repeat Duplex continues to show SVTs -Likely the cause of his swelling is his SVTs -Continue with OT  Abnormal LFT's -AST has worsened and gone from 37 -> 41 -> 80 -ALT has gone from 45 -> 46 -> 74 -We will obtain a right upper quadrant ultrasound and acute hepatitis panel in the morning -Continue monitor and trend daily until improving  DVT prophylaxis: Enoxaparin 40 mg sq q24h Code Status: FULL CODE  Family Communication: No family present at bedside  Disposition Plan: SNF vs CIR  Consultants:   PCCM Transfer  Neurology  Procedures:  8/8 ETT 8/14 ETT 08/19 Tracheostomy  8/23 PEG tube  Significant Diagnostic Tests:  8/8 CT head>>No acute intracranial abnormality. Large area of encephalomalacia throughout  the left MCA territory, consistent with remote infarct. 8/8 CXR>>lungs clear 8/8 UDS>>+ benzo (but given versed in ED); o/w negative 8/8 MRI of brain>>large territory chronic hemorrhagic infarct in the left frontal lobe.Ill-defined hyperintensity throughout the left basal ganglia and left thalamus with mild enhancement in dobutamine likely secondary to encephalitis, infiltrating tumor or atypical subacute infarction 8/8 MRI of face and trigeminal region>>rules out soft tissue abscess 08/17 CXR>>improved aeration to the right lung base  8/17 CT Abdomen wo Contrast >>cholelithiasis/trace ascites/aorthic  atherosclerosis   Micro Data:  8/8 Sars-CoV-2>>negative 8/8 BCx2>> no growth 8/8 UC>>Klebsiella pneumonia 08/14 BCx>> negative 08/14 Trach Cx>> gram positive rods and cocci in pairs 08/17 Trach Cx>> negative    Antimicrobials:  Anti-infectives (From admission, onward)   Start     Dose/Rate Route Frequency Ordered Stop   10/30/19 1400  cefTRIAXone (ROCEPHIN) 2 g in sodium chloride 0.9 % 100 mL IVPB        2 g 200 mL/hr over 30 Minutes Intravenous Every 24 hours 10/30/19 1251     10/18/19 2300  vancomycin (VANCOREADY) IVPB 750 mg/150 mL  Status:  Discontinued        750 mg 150 mL/hr over 60 Minutes Intravenous Every 24 hours 10/18/19 1359 10/19/19 1217   10/18/19 0200  vancomycin (VANCOREADY) IVPB 500 mg/100 mL  Status:  Discontinued        500 mg 100 mL/hr over 60 Minutes Intravenous Every 12 hours 10/17/19 1242 10/18/19 1359   10/17/19 1245  vancomycin (VANCOCIN) IVPB 1000 mg/200 mL premix        1,000 mg 200 mL/hr over 60 Minutes Intravenous  Once 10/17/19 1242 10/17/19 1441   10/17/19 1245  piperacillin-tazobactam (ZOSYN) IVPB 3.375 g        3.375 g 12.5 mL/hr over 240 Minutes Intravenous Every 8 hours 10/17/19 1242 10/24/19 2359   10/17/19 0900  ampicillin-sulbactam (UNASYN) 1.5 g in sodium chloride 0.9 % 100 mL IVPB  Status:  Discontinued        1.5 g 200 mL/hr over 30 Minutes Intravenous Every 6 hours 10/17/19 0809 10/17/19 0850   10/17/19 0900  Ampicillin-Sulbactam (UNASYN) 3 g in sodium chloride 0.9 % 100 mL IVPB  Status:  Discontinued        3 g 200 mL/hr over 30 Minutes Intravenous Every 6 hours 10/17/19 0850 10/17/19 1242   10/14/19 0600  vancomycin (VANCOREADY) IVPB 500 mg/100 mL  Status:  Discontinued        500 mg 100 mL/hr over 60 Minutes Intravenous Every 12 hours 10/14/19 0057 10/16/19 1534   10/12/19 1600  ampicillin (OMNIPEN) 2 g in sodium chloride 0.9 % 100 mL IVPB  Status:  Discontinued        2 g 300 mL/hr over 20 Minutes Intravenous Every 6 hours  10/12/19 1400 10/14/19 0934   10/12/19 0000  vancomycin (VANCOREADY) IVPB 750 mg/150 mL  Status:  Discontinued        750 mg 150 mL/hr over 60 Minutes Intravenous Every 12 hours 10/11/19 1254 10/14/19 0046   10/11/19 1230  cefTRIAXone (ROCEPHIN) 2 g in sodium chloride 0.9 % 100 mL IVPB  Status:  Discontinued        2 g 200 mL/hr over 30 Minutes Intravenous Every 12 hours 10/11/19 1133 10/16/19 1534   10/11/19 1230  ampicillin (OMNIPEN) 2 g in sodium chloride 0.9 % 100 mL IVPB  Status:  Discontinued        2 g 300 mL/hr over 20 Minutes Intravenous  Every 4 hours 10/11/19 1133 10/12/19 1400   10/11/19 1200  vancomycin (VANCOREADY) IVPB 1500 mg/300 mL        1,500 mg 150 mL/hr over 120 Minutes Intravenous  Once 10/11/19 1135 10/11/19 1419   10/11/19 1145  vancomycin (VANCOCIN) IVPB 1000 mg/200 mL premix  Status:  Discontinued        1,000 mg 200 mL/hr over 60 Minutes Intravenous  Once 10/11/19 1133 10/11/19 1135      Subjective: Seen and examined at bedside and he continues to be aphasic but he was having a difficult time coughing out his secretions.  With the nurses at bedside about to suction him.  I spoke to him about having abnormal LFTs in about getting ultrasound of the abdomen and he was agreeable.  He calmed down a bit after he got suction.  Still very weak.   Objective: Vitals:   10/31/19 1222 10/31/19 1405 10/31/19 1500 10/31/19 1507  BP: (!) 160/69   (!) 161/74  Pulse: 88 83  85  Resp: '20 12 18 20  ' Temp: 99.2 F (37.3 C)   98.6 F (37 C)  TempSrc: Oral   Oral  SpO2: 98% 96%  97%  Weight:      Height:        Intake/Output Summary (Last 24 hours) at 10/31/2019 1802 Last data filed at 10/31/2019 1509 Gross per 24 hour  Intake 6275 ml  Output 2500 ml  Net 3775 ml   Filed Weights   10/24/19 0500 10/28/19 0500 10/31/19 0420  Weight: 81.8 kg 73.2 kg 76.5 kg   Examination: Physical Exam:  Constitutional: Patient is a thin chronically ill-appearing Caucasian male  currently in some mild distress chronic cough up some sputum in about to be suctioned from his tracheostomy Eyes: Lids and conjunctive are normal.  Sclera anicteric. ENMT: External Ears, Nose appear normal. Grossly normal hearing.  Core track in place Neck: Has a tracheostomy with trach collar and is coughing up sputum from the trach Respiratory: Diminished to auscultation bilaterally with coarse breath sounds, no wheezing, rales, rhonchi or crackles.  He has an increased respiratory effort and he is having a hard time coughing up the sputum.  Wearing supplemental oxygen via trach collar and desaturates a little bit when he coughs Cardiovascular: Tachycardic rate but regular rhythm.  Has right upper arm edema. Abdomen: Soft, non-tender, non-distended. Bowel sounds positive.  GU: Deferred.  Has a Flexi-Seal and a Foley catheter in place Musculoskeletal: No clubbing / cyanosis of digits/nails. No joint deformity upper and lower extremities.  Skin: No rashes, lesions, ulcers on a limited skin evaluation. No induration; Warm and dry.  Neurologic: Has hemiplegia and aphasia and shakes his head yes any.  Psychiatric: Normal judgment and insight. Alert and oriented x 3. Normal mood and appropriate affect.   Data Reviewed: I have personally reviewed following labs and imaging studies  CBC: Recent Labs  Lab 10/27/19 0427 10/28/19 0812 10/29/19 0924 10/30/19 0429 10/31/19 0147  WBC 9.2 10.0 14.9* 14.5* 14.2*  NEUTROABS  --   --  12.7* 12.2* 11.7*  HGB 8.3* 8.8* 9.2* 8.7* 8.7*  HCT 25.4* 26.7* 27.7* 26.7* 25.9*  MCV 90.4 89.9 91.7 92.7 91.8  PLT 460* 495* 529* 478* 794*   Basic Metabolic Panel: Recent Labs  Lab 10/27/19 0427 10/27/19 0427 10/27/19 1422 10/28/19 0812 10/29/19 0456 10/29/19 0924 10/30/19 0429 10/31/19 0147  NA 140  --   --  138 138  --  136 137  K 3.0*   < >  3.7 3.6 3.9  --  3.6 3.6  CL 107  --   --  104 102  --  102 99  CO2 21*  --   --  23 26  --  26 26  GLUCOSE  136*  --   --  206* 229*  --  209* 222*  BUN 9  --   --  13 16  --  20 19  CREATININE 1.10  --   --  1.09 1.13  --  1.15 1.26*  CALCIUM 7.6*  --   --  8.2* 8.5*  --  8.7* 8.7*  MG  --   --   --  1.9  --  1.8 1.8 1.8  PHOS  --   --   --   --   --  3.5 3.7 4.1   < > = values in this interval not displayed.   GFR: Estimated Creatinine Clearance: 68.3 mL/min (A) (by C-G formula based on SCr of 1.26 mg/dL (H)). Liver Function Tests: Recent Labs  Lab 10/27/19 0427 10/28/19 0812 10/29/19 0456 10/30/19 0429 10/31/19 0147  AST 36 39 37 41 80*  ALT 45* 45* 45* 46* 74*  ALKPHOS 114 134* 137* 147* 148*  BILITOT 0.4 0.3 0.3 0.3 0.3  PROT 5.5* 6.1* 6.5 6.8 6.6  ALBUMIN 1.9* 2.2* 2.3* 2.4* 2.2*   No results for input(s): LIPASE, AMYLASE in the last 168 hours. No results for input(s): AMMONIA in the last 168 hours. Coagulation Profile: No results for input(s): INR, PROTIME in the last 168 hours. Cardiac Enzymes: No results for input(s): CKTOTAL, CKMB, CKMBINDEX, TROPONINI in the last 168 hours. BNP (last 3 results) No results for input(s): PROBNP in the last 8760 hours. HbA1C: No results for input(s): HGBA1C in the last 72 hours. CBG: Recent Labs  Lab 10/30/19 2346 10/31/19 0351 10/31/19 0852 10/31/19 1225 10/31/19 1617  GLUCAP 176* 248* 287* 215* 238*   Lipid Profile: No results for input(s): CHOL, HDL, LDLCALC, TRIG, CHOLHDL, LDLDIRECT in the last 72 hours. Thyroid Function Tests: No results for input(s): TSH, T4TOTAL, FREET4, T3FREE, THYROIDAB in the last 72 hours. Anemia Panel: No results for input(s): VITAMINB12, FOLATE, FERRITIN, TIBC, IRON, RETICCTPCT in the last 72 hours. Sepsis Labs: Recent Labs  Lab 10/30/19 1343 10/30/19 1645 10/31/19 0147  PROCALCITON <0.10  --  <0.10  LATICACIDVEN 1.2 1.2  --     Recent Results (from the past 240 hour(s))  Culture, blood (routine x 2)     Status: None (Preliminary result)   Collection Time: 10/30/19  1:43 PM   Specimen:  BLOOD  Result Value Ref Range Status   Specimen Description BLOOD LEFT ANTECUBITAL  Final   Special Requests   Final    BOTTLES DRAWN AEROBIC ONLY Blood Culture results may not be optimal due to an inadequate volume of blood received in culture bottles   Culture   Final    NO GROWTH < 24 HOURS Performed at [facility], [place] [address] [place], [place] [address]    Report Status PENDING  Incomplete  Culture, blood (routine x 2)     Status: None (Preliminary result)   Collection Time: 10/30/19  1:43 PM   Specimen: BLOOD  Result Value Ref Range Status   Specimen Description BLOOD BLOOD LEFT HAND  Final   Special Requests   Final    BOTTLES DRAWN AEROBIC AND ANAEROBIC Blood Culture adequate volume   Culture   Final    NO GROWTH <  24 HOURS Performed at [facility], [place] [address] [place], [place] [address]    Report Status PENDING  Incomplete     RN Pressure Injury Documentation:     Estimated body mass index is 22.25 kg/m as calculated from the following:   Height as of this encounter: '6\' 1"'  (1.854 m).   Weight as of this encounter: 76.5 kg.  Malnutrition Type:  Nutrition Problem: Inadequate oral intake Etiology: inability to eat   Malnutrition Characteristics:  Signs/Symptoms: NPO status   Nutrition Interventions:  Interventions: Tube feeding    Radiology Studies: DG CHEST PORT 1 VIEW  Result Date: 10/31/2019 CLINICAL DATA:  Shortness of breath EXAM: PORTABLE CHEST 1 VIEW COMPARISON:  Portable exam 0657 hours compared to 10/30/2019 FINDINGS: Feeding tube extends into stomach. Tracheostomy tube stable. Normal heart size, mediastinal contours, and pulmonary vascularity. Atherosclerotic calcification aorta. BILATERAL pulmonary infiltrates increased since previous exam. Potential layered RIGHT pleural effusion. No pneumothorax or acute osseous findings. IMPRESSION: BILATERAL pulmonary infiltrates slightly greater on RIGHT, increased since previous exam; this  could represent pneumonia or less likely asymmetric edema. Questionable layered RIGHT pleural effusion. Electronically Signed   By: Lavonia Dana M.D.   On: 10/31/2019 13:12   DG CHEST PORT 1 VIEW  Result Date: 10/30/2019 CLINICAL DATA:  Shortness of breath. EXAM: PORTABLE CHEST 1 VIEW COMPARISON:  Chest x-ray 10/22/2019. FINDINGS: Interim removal of left PICC line. Tracheostomy tube and feeding tube in stable position. Cardiomegaly. Diffuse bilateral interstitial prominence consistent with interstitial edema and or pneumonitis. Small right pleural effusion. IMPRESSION: 1. Interim removal of left PICC line. Tracheostomy tube and feeding tube in stable position. 2.  Cardiomegaly. 3. Diffuse bilateral interstitial prominence noted on today's exam. Small right pleural effusion noted on today's exam. CHF could present in this fashion. Pneumonitis cannot be excluded. Electronically Signed   By: Marcello Moores  Register   On: 10/30/2019 07:56   VAS [place] UPPER EXTREMITY VENOUS DUPLEX  Result Date: 10/30/2019 UPPER VENOUS STUDY  Indications: Swelling Comparison Study: 10-19-2019 Right Upper Extremity Venous study Performing Technologist: Darlin Coco  Examination Guidelines: A complete evaluation includes B-mode imaging, spectral Doppler, color Doppler, and power Doppler as needed of all accessible portions of each vessel. Bilateral testing is considered an integral part of a complete examination. Limited examinations for reoccurring indications may be performed as noted.  Right Findings: +----------+------------+---------+-----------+----------+-----------------+ RIGHT     CompressiblePhasicitySpontaneousProperties     Summary      +----------+------------+---------+-----------+----------+-----------------+ IJV           Full       Yes       Yes                                +----------+------------+---------+-----------+----------+-----------------+ Subclavian    Full       Yes       Yes                                 +----------+------------+---------+-----------+----------+-----------------+ Axillary      Full       Yes       Yes                                +----------+------------+---------+-----------+----------+-----------------+ Brachial      Full       Yes  Yes                                +----------+------------+---------+-----------+----------+-----------------+ Radial        Full       Yes       Yes                                +----------+------------+---------+-----------+----------+-----------------+ Ulnar         Full       Yes       Yes                                +----------+------------+---------+-----------+----------+-----------------+ Cephalic      None       No        No               Age Indeterminate +----------+------------+---------+-----------+----------+-----------------+ Basilic       None       No        No               Age Indeterminate +----------+------------+---------+-----------+----------+-----------------+  Left Findings: +----------+------------+---------+-----------+----------+-------+ LEFT      CompressiblePhasicitySpontaneousPropertiesSummary +----------+------------+---------+-----------+----------+-------+ Subclavian    Full       Yes       Yes                      +----------+------------+---------+-----------+----------+-------+  Summary:  Right: No evidence of deep vein thrombosis in the upper extremity. Findings consistent with acute superficial vein thrombosis involving the right basilic vein and right cephalic vein.  Left: No evidence of thrombosis in the subclavian.  *See table(s) above for measurements and observations.  Diagnosing physician: Servando Snare MD Electronically signed by Servando Snare MD on 10/30/2019 at 11:19:54 AM.    Final      Scheduled Meds: . amLODipine  10 mg Per Tube Daily  . aspirin  81 mg Per Tube Daily  . atorvastatin  80 mg Per Tube Daily  . bethanechol  15 mg Per  Tube TID  . chlorhexidine gluconate (MEDLINE KIT)  15 mL Mouth Rinse BID  . Chlorhexidine Gluconate Cloth  6 each Topical Daily  . clopidogrel  75 mg Per Tube Daily  . enoxaparin (LOVENOX) injection  40 mg Subcutaneous Q24H  . feeding supplement (PROSource TF)  45 mL Per Tube BID  . Gerhardt's butt cream   Topical Daily  . insulin aspart  0-15 Units Subcutaneous Q4H  . lacosamide  150 mg Per Tube BID  . levETIRAcetam  1,000 mg Per Tube BID  . mouth rinse  15 mL Mouth Rinse q12n4p  . phenytoin  100 mg Per Tube TID  . QUEtiapine  50 mg Per Tube QHS  . sennosides  5 mL Per Tube BID   Continuous Infusions: . sodium chloride Stopped (10/15/19 1834)  . cefTRIAXone (ROCEPHIN)  IV 2 g (10/31/19 1312)  . feeding supplement (OSMOLITE 1.5 CAL) 1,000 mL (10/31/19 0342)    LOS: 20 days   Kerney Elbe, DO [facility] PAGER is on [place]  If 7PM-7AM, please contact night-coverage [email or web address]

## 2019-10-31 NOTE — Plan of Care (Signed)

## 2019-11-01 LAB — CBC WITH DIFFERENTIAL/PLATELET
Abs Immature Granulocytes: 0.05 10*3/uL (ref 0.00–0.07)
Basophils Absolute: 0.1 10*3/uL (ref 0.0–0.1)
Basophils Relative: 1 %
Eosinophils Absolute: 0.3 10*3/uL (ref 0.0–0.5)
Eosinophils Relative: 3 %
HCT: 27.2 % — ABNORMAL LOW (ref 39.0–52.0)
Hemoglobin: 8.9 g/dL — ABNORMAL LOW (ref 13.0–17.0)
Immature Granulocytes: 1 %
Lymphocytes Relative: 10 %
Lymphs Abs: 1 10*3/uL (ref 0.7–4.0)
MCH: 29.8 pg (ref 26.0–34.0)
MCHC: 32.7 g/dL (ref 30.0–36.0)
MCV: 91 fL (ref 80.0–100.0)
Monocytes Absolute: 0.6 10*3/uL (ref 0.1–1.0)
Monocytes Relative: 7 %
Neutro Abs: 7.8 10*3/uL — ABNORMAL HIGH (ref 1.7–7.7)
Neutrophils Relative %: 78 %
Platelets: 447 10*3/uL — ABNORMAL HIGH (ref 150–400)
RBC: 2.99 MIL/uL — ABNORMAL LOW (ref 4.22–5.81)
RDW: 14.4 % (ref 11.5–15.5)
WBC: 9.9 10*3/uL (ref 4.0–10.5)
nRBC: 0 % (ref 0.0–0.2)

## 2019-11-01 LAB — COMPREHENSIVE METABOLIC PANEL
ALT: 85 U/L — ABNORMAL HIGH (ref 0–44)
AST: 80 U/L — ABNORMAL HIGH (ref 15–41)
Albumin: 2.3 g/dL — ABNORMAL LOW (ref 3.5–5.0)
Alkaline Phosphatase: 154 U/L — ABNORMAL HIGH (ref 38–126)
Anion gap: 11 (ref 5–15)
BUN: 22 mg/dL — ABNORMAL HIGH (ref 6–20)
CO2: 26 mmol/L (ref 22–32)
Calcium: 8.6 mg/dL — ABNORMAL LOW (ref 8.9–10.3)
Chloride: 98 mmol/L (ref 98–111)
Creatinine, Ser: 1.23 mg/dL (ref 0.61–1.24)
GFR calc Af Amer: >60 mL/min (ref >60)
GFR calc non Af Amer: >60 mL/min (ref >60)
Glucose, Bld: 263 mg/dL — ABNORMAL HIGH (ref 70–99)
Potassium: 3.8 mmol/L (ref 3.5–5.1)
Sodium: 135 mmol/L (ref 135–145)
Total Bilirubin: 0.3 mg/dL (ref 0.3–1.2)
Total Protein: 7 g/dL (ref 6.5–8.1)

## 2019-11-01 LAB — URINALYSIS, ROUTINE W REFLEX MICROSCOPIC
Bilirubin Urine: NEGATIVE
Glucose, UA: 50 mg/dL — AB
Ketones, ur: NEGATIVE mg/dL
Nitrite: NEGATIVE
Protein, ur: 100 mg/dL — AB
RBC / HPF: >50 RBC/hpf — ABNORMAL HIGH (ref 0–5)
Specific Gravity, Urine: 1.012 (ref 1.005–1.030)
pH: 6 (ref 5.0–8.0)

## 2019-11-01 LAB — PHOSPHORUS: Phosphorus: 3.7 mg/dL (ref 2.5–4.6)

## 2019-11-01 LAB — GLUCOSE, CAPILLARY
Glucose-Capillary: 190 mg/dL — ABNORMAL HIGH (ref 70–99)
Glucose-Capillary: 223 mg/dL — ABNORMAL HIGH (ref 70–99)
Glucose-Capillary: 226 mg/dL — ABNORMAL HIGH (ref 70–99)
Glucose-Capillary: 258 mg/dL — ABNORMAL HIGH (ref 70–99)
Glucose-Capillary: 207 mg/dL — ABNORMAL HIGH (ref 70–99)
Glucose-Capillary: 235 mg/dL — ABNORMAL HIGH (ref 70–99)

## 2019-11-01 LAB — PROCALCITONIN: Procalcitonin: <0.1 ng/mL

## 2019-11-01 LAB — MAGNESIUM: Magnesium: 1.8 mg/dL (ref 1.7–2.4)

## 2019-11-01 MED ORDER — FUROSEMIDE 10 MG/ML IJ SOLN
40.0000 mg | Freq: Once | INTRAMUSCULAR | Status: AC
  Administered 2019-11-01: 40 mg via INTRAVENOUS
  Filled 2019-11-01: qty 4

## 2019-11-01 NOTE — Plan of Care (Signed)
  Problem: Activity: Goal: Ability to tolerate increased activity will improve Outcome: Progressing   Problem: Education: Goal: Knowledge of General Education information will improve Description: Including pain rating scale, medication(s)/side effects and non-pharmacologic comfort measures Outcome: Progressing   

## 2019-11-01 NOTE — Progress Notes (Signed)
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 Patient is living in a assisted living facility and can ambulate by himself. Patient was found by staff to have a right sided twitching with right eye deviation. He was given Versed and Dilantin and was intubated for airway protection.  Patient was extubated on 8/11 and his seizures was controlled with Keppra, phenytoin, and Vimpat.He was transferred to [address] for medical management  On 8/14,patient continued to have seizure with twitching of the right upper extremity. 2 mg of lorazepam was given. Patient continued to desat in the 80s on 15 L of nonrebreather mask with altered mental status. Patient was re intubated to protect airway and transferred to the ICU. On 10/22/2019 he had a regular tracheostomy and 8/23 was going to get a PEG tube placement but this was deferred to his request and he ended up getting a Cortrak placement and is currently getting tube feeds.  Currently he has failed his swallowing significantly and SLP is following closely.  We will continue to follow swallowing rehabilitation prior to PEG tube placement as SLP recommend strict n.p.o.  On 8/27/21he failed his swallow study again and I discussed with him about PEG tube placement and he wants to wait the weekend and if he feels he can monitor that he will likely be agreeable.  10/31/19 Started coughing significantly and trying to cough up some sputum.  Nurse was in the return to suction him.  His LFTs were abnormal so discussed with him about obtaining ultrasound and he is agreeable.  Given a dose of Lasix yesterday.  11/01/19 Respiratory Status is a little improved. LFTs remain elevated but  are stable. No recurrent fevers and U/A showing Hematuria. He has some abdominal pain.   Assessment & Plan:   Principal Problem:   Status epilepticus ([place]) Active Problems:   Carotid artery stenosis   Seizure (HCC)   CVA (cerebrovascular accident due to intracerebral hemorrhage) (HCC)/ left frontal lobe   AKI (acute kidney injury) ([place])   Essential hypertension   Type 2 diabetes mellitus with hyperlipidemia (HCC)  Chronic respiratory failure with hypoxemia requiring tracheostomy tube placement and mechanical ventilatory support. -Trach collar placed 8/23, remains in place -SpO2: 99 % O2 Flow Rate (L/min): 5 L/min FiO2 (%): 28 %; Wean as tolerated  -Continue with duo nebs 3 mL nebulizer every 6 hours as needed for wheezing or shortness of breath -Speech path to reevaluate swallow and he failed again -Oral suction with tube care  -Transferred to PCU -CXR 8/27 showed "Interim removal of left PICC line. Tracheostomy tube and feeding tube in stable position. Cardiomegaly. Diffuse bilateral interstitial prominence noted on today's exam. Small right pleural effusion noted on today's exam. CHF could present in this fashion. Pneumonitis cannot be excluded." -Repeat CXR 8/28 showed "BILATERAL pulmonary infiltrates slightly greater on RIGHT, increased since previous exam; this could represent pneumonia or less likely asymmetric edema. Questionable layered RIGHT pleural effusion." -Received a dose of IV Lasix yesterday to see if this will help his respiratory status and it did slightly -Will give another dose of IV Lasix  Fever -Unclear etiology and is 101.9 and now improved  -Chest x-ray as above -Patient's lactic acid level is 1.2 and procalcitonin level was  less than 0.10 -He does have a leukocytosis and his WBC peaked to 14.9 and is now slowly improving and 14.2 -Check urinalysis given that he is having acute urinary retention to rule out a UTI -Blood cultures x2 ordered and showed NGTD2  Days -Urinalysis done but Urine Cx not sent -U/A showed a hazy appearance with large hemoglobin, moderate leukocytes, rare bacteria, present budding yeast, greater than 50 RBCs per high-power field and 0-5 WBCs -Continue monitor temperature curve and repeat CBC in a.m. -Empirically start IV ceftriaxone  Status epilepticus in the setting of recent large left MCA stroke -No arm/eye twitching since ICU transfer -Continue current AED regimen/ Dilantin and Vimpat, transition to PO via Cortrak and po when able to swallow; Patient keeps failing his swallow studies and discussed with him and he wants to wait until Monday to re-evaluate  Hypertension -BP this Afternoon is 140/61 -Avoid initiation of ARB in setting of AKI. -Restarted home Norovasc 5 mg on 8/23, transition all meds PO, SBP <160 -Continue with labetalol 5 mg IV every 2 as needed for high blood pressure of SBP of greater than 180.  Urinary Retention - Continue Benechol - Removed Foley however had to be reinserted the night before last as he was retaining and had 523 mL in his bladder -Check Urinalysis and Urine Cx and urinalysis was done but urine culture is still not sent -Will need to send   AKI, improved -Improved. BUN/creatinine peaked at 29/2.12 and currently stable at 22/1.23 -Avoid further nephrotoxic medications, contrast dyes, hypotension and renally adjust medications -Repeat CMP in a.m.  Diabetes type 2 with Hyperglycemia  -Continued current regimen of moderate NovoLog/scale insulin every 4 hours and may need to adjust further -CBGs ranging from 190-226 -Will resume Lantus 12 unis sq qHS and continue monitor  Dysphagia.  - SLP following. Deferred PEG at patient's request.  - Continue tube feeds via Cortrak - Trial of swallowing rehabilitation prior to PEG tube. If he fails Monday will pursue a PEG tube at that time -SLP to re-evaluate and he continues to fail -In the interim will consult IR for evaluation for  PEG early next week if he continues to fail -Currently has a Flexical from Diarrhea from TF and will discontinue slowdown -We will repeat SLP evaluation in a.m.  Normocytic Anemia -Patient's hemoglobin/hematocrit is stable at 8.9/27.2 -Check anemia panel in the a.m. next-continue to monitor for signs and symptoms of bleeding; currently no overt bleeding noted Repeat CBC in a.m. -We will resume VTE prophylaxis as well as his home clopidogrel  Leukocytosis -Likely reactive -The patient's WBC went from 10.0 is now 14.9 and is now 14.2 -> 9.9 -Continue to monitor and trend and since he spiked a temperature we will panculture -Repeat CBC in a.m.  Thrombocytosis -Likely reactive -Continue to monitor and trend -Patient's platelet count has gone from 460 -> 529 -> 478 -> 454 -> 447  Hx of Left MCA stroke with residual aphasia and right hemiparesis  -Continue with aspirin 81 mg p.o. daily, atorvastatin 80 mg p.o. nightly, and clopidogrel 75 mg via Cortrak -We will obtain PT OT to further evaluate and treat and he will need SNF  Right arm swelling  -The setting of dependent edema but also has diagnosed SVT in the cephalic and basilic veins; Repeat Duplex continues to show SVTs -Likely the cause of his swelling is his SVTs -Continue with OT  Abnormal LFT's, not worsening -AST has worsened and gone from 37 -> 41 -> 80 ->  80 -ALT has gone from 45 -> 46 -> 74 -> 85 -RUQ U/s done and showed "Unremarkable right upper quadrant. Small right pleural effusion." -Continue monitor and trend daily until improving  DVT prophylaxis: Enoxaparin 40 mg sq q24h Code Status: FULL CODE  Family Communication: No family present at bedside  Disposition Plan: SNF vs CIR  Consultants:   PCCM Transfer  Neurology  Procedures:  8/8 ETT 8/14 ETT 08/19 Tracheostomy  8/23 PEG tube  Significant Diagnostic Tests:  8/8 CT head>>No acute intracranial abnormality. Large area of encephalomalacia throughout  the left MCA territory, consistent with remote infarct. 8/8 CXR>>lungs clear 8/8 UDS>>+ benzo (but given versed in ED); o/w negative 8/8 MRI of brain>>large territory chronic hemorrhagic infarct in the left frontal lobe.Ill-defined hyperintensity throughout the left basal ganglia and left thalamus with mild enhancement in dobutamine likely secondary to encephalitis, infiltrating tumor or atypical subacute infarction 8/8 MRI of face and trigeminal region>>rules out soft tissue abscess 08/17 CXR>>improved aeration to the right lung base  8/17 CT Abdomen wo Contrast >>cholelithiasis/trace ascites/aorthic atherosclerosis   Micro Data:  8/8 Sars-CoV-2>>negative 8/8 BCx2>> no growth 8/8 UC>>Klebsiella pneumonia 08/14 BCx>> negative 08/14 Trach Cx>> gram positive rods and cocci in pairs 08/17 Trach Cx>> negative    Antimicrobials:  Anti-infectives (From admission, onward)   Start     Dose/Rate Route Frequency Ordered Stop   10/30/19 1400  cefTRIAXone (ROCEPHIN) 2 g in sodium chloride 0.9 % 100 mL IVPB        2 g 200 mL/hr over 30 Minutes Intravenous Every 24 hours 10/30/19 1251     10/18/19 2300  vancomycin (VANCOREADY) IVPB 750 mg/150 mL  Status:  Discontinued        750 mg 150 mL/hr over 60 Minutes Intravenous Every 24 hours 10/18/19 1359 10/19/19 1217   10/18/19 0200  vancomycin (VANCOREADY) IVPB 500 mg/100 mL  Status:  Discontinued        500 mg 100 mL/hr over 60 Minutes Intravenous Every 12 hours 10/17/19 1242 10/18/19 1359   10/17/19 1245  vancomycin (VANCOCIN) IVPB 1000 mg/200 mL premix        1,000 mg 200 mL/hr over 60 Minutes Intravenous  Once 10/17/19 1242 10/17/19 1441   10/17/19 1245  piperacillin-tazobactam (ZOSYN) IVPB 3.375 g        3.375 g 12.5 mL/hr over 240 Minutes Intravenous Every 8 hours 10/17/19 1242 10/24/19 2359   10/17/19 0900  ampicillin-sulbactam (UNASYN) 1.5 g in sodium chloride 0.9 % 100 mL IVPB  Status:  Discontinued        1.5 g 200 mL/hr over  30 Minutes Intravenous Every 6 hours 10/17/19 0809 10/17/19 0850   10/17/19 0900  Ampicillin-Sulbactam (UNASYN) 3 g in sodium chloride 0.9 % 100 mL IVPB  Status:  Discontinued        3 g 200 mL/hr over 30 Minutes Intravenous Every 6 hours 10/17/19 0850 10/17/19 1242   10/14/19 0600  vancomycin (VANCOREADY) IVPB 500 mg/100 mL  Status:  Discontinued        500 mg 100 mL/hr over 60 Minutes Intravenous Every 12 hours 10/14/19 0057 10/16/19 1534   10/12/19 1600  ampicillin (OMNIPEN) 2 g in sodium chloride 0.9 % 100 mL IVPB  Status:  Discontinued        2 g 300 mL/hr over 20 Minutes Intravenous Every 6 hours 10/12/19 1400 10/14/19 0934   10/12/19 0000  vancomycin (VANCOREADY) IVPB 750 mg/150 mL  Status:  Discontinued  750 mg 150 mL/hr over 60 Minutes Intravenous Every 12 hours 10/11/19 1254 10/14/19 0046   10/11/19 1230  cefTRIAXone (ROCEPHIN) 2 g in sodium chloride 0.9 % 100 mL IVPB  Status:  Discontinued        2 g 200 mL/hr over 30 Minutes Intravenous Every 12 hours 10/11/19 1133 10/16/19 1534   10/11/19 1230  ampicillin (OMNIPEN) 2 g in sodium chloride 0.9 % 100 mL IVPB  Status:  Discontinued        2 g 300 mL/hr over 20 Minutes Intravenous Every 4 hours 10/11/19 1133 10/12/19 1400   10/11/19 1200  vancomycin (VANCOREADY) IVPB 1500 mg/300 mL        1,500 mg 150 mL/hr over 120 Minutes Intravenous  Once 10/11/19 1135 10/11/19 1419   10/11/19 1145  vancomycin (VANCOCIN) IVPB 1000 mg/200 mL premix  Status:  Discontinued        1,000 mg 200 mL/hr over 60 Minutes Intravenous  Once 10/11/19 1133 10/11/19 1135      Subjective: Seen and examined at bedside and he continues to be aphasic but states that he is doing okay.  Breathing status is stable.  Asked about abdominal pain and he states yes.  Also complains of some nausea.  No other concerns or complaints at this time.  Objective: Vitals:   11/01/19 0902 11/01/19 1223 11/01/19 1356 11/01/19 1600  BP: (!) 152/70 140/61 (!) 141/68 140/61   Pulse: 86 80 82 80  Resp:  '20 14 20  ' Temp: 98.4 F (36.9 C) 98 F (36.7 C)  98 F (36.7 C)  TempSrc: Oral Axillary  Oral  SpO2: 99%  96% 99%  Weight:      Height:        Intake/Output Summary (Last 24 hours) at 11/01/2019 1749 Last data filed at 11/01/2019 1638 Gross per 24 hour  Intake 2464.92 ml  Output 2210 ml  Net 254.92 ml   Filed Weights   10/28/19 0500 10/31/19 0420 11/01/19 0342  Weight: 73.2 kg 76.5 kg 76.7 kg   Examination: Physical Exam:  Constitutional: Patient is a thin chronically ill-appearing Caucasian male currently in no acute distress today appears calmer than yesterday. Eyes: Lids and conjunctivae normal, sclerae anicteric  ENMT: External Ears, Nose appear normal. Grossly normal hearing.  Has a core track in place Neck: Has a tracheostomy and is connected to the ATC Respiratory: Diminished to auscultation bilaterally with coarse breath sounds some crackles and some slight rhonchi. Appears calm and does not have an increased respiratory effort and continues to wear supplemental oxygen via his trach collar Cardiovascular: RRR, no murmurs / rubs / gallops. S1 and S2 auscultated. No extremity edema.  Abdomen: Soft, non-tender, non-distended. Bowel sounds positive.  GU: Deferred.  Has a Foley catheter in place as well as a Flexi-Seal Musculoskeletal: No clubbing / cyanosis of digits/nails. No joint deformity upper and lower extremities.  Skin: No rashes, lesions, ulcers on limited skin evaluation. No induration; Warm and dry.  Neurologic: Patient is hemiplegic and aphasic and checks his head yes or no. Psychiatric: Normal judgment and insight. Alert and oriented x 3. Normal mood and appropriate affect.   Data Reviewed: I have personally reviewed following labs and imaging studies  CBC: Recent Labs  Lab 10/28/19 0812 10/29/19 0924 10/30/19 0429 10/31/19 0147 11/01/19 0927  WBC 10.0 14.9* 14.5* 14.2* 9.9  NEUTROABS  --  12.7* 12.2* 11.7* 7.8*  HGB 8.8*  9.2* 8.7* 8.7* 8.9*  HCT 26.7* 27.7* 26.7* 25.9* 27.2*  MCV 89.9 91.7 92.7 91.8 91.0  PLT 495* 529* 478* 454* 676*   Basic Metabolic Panel: Recent Labs  Lab 10/28/19 0812 10/29/19 0456 10/29/19 0924 10/30/19 0429 10/31/19 0147 11/01/19 0927  NA 138 138  --  136 137 135  K 3.6 3.9  --  3.6 3.6 3.8  CL 104 102  --  102 99 98  CO2 23 26  --  '26 26 26  ' GLUCOSE 206* 229*  --  209* 222* 263*  BUN 13 16  --  20 19 22*  CREATININE 1.09 1.13  --  1.15 1.26* 1.23  CALCIUM 8.2* 8.5*  --  8.7* 8.7* 8.6*  MG 1.9  --  1.8 1.8 1.8 1.8  PHOS  --   --  3.5 3.7 4.1 3.7   GFR: Estimated Creatinine Clearance: 70.2 mL/min (by C-G formula based on SCr of 1.23 mg/dL). Liver Function Tests: Recent Labs  Lab 10/28/19 0812 10/29/19 0456 10/30/19 0429 10/31/19 0147 11/01/19 0927  AST 39 37 41 80* 80*  ALT 45* 45* 46* 74* 85*  ALKPHOS 134* 137* 147* 148* 154*  BILITOT 0.3 0.3 0.3 0.3 0.3  PROT 6.1* 6.5 6.8 6.6 7.0  ALBUMIN 2.2* 2.3* 2.4* 2.2* 2.3*   No results for input(s): LIPASE, AMYLASE in the last 168 hours. No results for input(s): AMMONIA in the last 168 hours. Coagulation Profile: No results for input(s): INR, PROTIME in the last 168 hours. Cardiac Enzymes: No results for input(s): CKTOTAL, CKMB, CKMBINDEX, TROPONINI in the last 168 hours. BNP (last 3 results) No results for input(s): PROBNP in the last 8760 hours. HbA1C: No results for input(s): HGBA1C in the last 72 hours. CBG: Recent Labs  Lab 10/31/19 2340 11/01/19 0331 11/01/19 0752 11/01/19 1243 11/01/19 1553  GLUCAP 247* 190* 223* 226* 258*   Lipid Profile: No results for input(s): CHOL, HDL, LDLCALC, TRIG, CHOLHDL, LDLDIRECT in the last 72 hours. Thyroid Function Tests: No results for input(s): TSH, T4TOTAL, FREET4, T3FREE, THYROIDAB in the last 72 hours. Anemia Panel: No results for input(s): VITAMINB12, FOLATE, FERRITIN, TIBC, IRON, RETICCTPCT in the last 72 hours. Sepsis Labs: Recent Labs  Lab  10/30/19 1343 10/30/19 1645 10/31/19 0147 11/01/19 0128  PROCALCITON <0.10  --  <0.10 <0.10  LATICACIDVEN 1.2 1.2  --   --     Recent Results (from the past 240 hour(s))  Culture, blood (routine x 2)     Status: None (Preliminary result)   Collection Time: 10/30/19  1:43 PM   Specimen: BLOOD  Result Value Ref Range Status   Specimen Description BLOOD LEFT ANTECUBITAL  Final   Special Requests   Final    BOTTLES DRAWN AEROBIC ONLY Blood Culture results may not be optimal due to an inadequate volume of blood received in culture bottles   Culture   Final    NO GROWTH 2 DAYS Performed at [facility] [address], [place], [place] [address]    Report Status PENDING  Incomplete  Culture, blood (routine x 2)     Status: None (Preliminary result)   Collection Time: 10/30/19  1:43 PM   Specimen: BLOOD  Result Value Ref Range Status   Specimen Description BLOOD BLOOD LEFT HAND  Final   Special Requests   Final    BOTTLES DRAWN AEROBIC AND ANAEROBIC Blood Culture adequate volume   Culture   Final    NO GROWTH 2 DAYS Performed at [facility], [place] [address] [place], [place] [address]  Report Status PENDING  Incomplete     RN Pressure Injury Documentation:     Estimated body mass index is 22.31 kg/m as calculated from the following:   Height as of this encounter: '6\' 1"'  (1.854 m).   Weight as of this encounter: 76.7 kg.  Malnutrition Type:  Nutrition Problem: Inadequate oral intake Etiology: inability to eat   Malnutrition Characteristics:  Signs/Symptoms: NPO status   Nutrition Interventions:  Interventions: Tube feeding    Radiology Studies: DG CHEST PORT 1 VIEW  Result Date: 10/31/2019 CLINICAL DATA:  Shortness of breath EXAM: PORTABLE CHEST 1 VIEW COMPARISON:  Portable exam 0657 hours compared to 10/30/2019 FINDINGS: Feeding tube extends into stomach. Tracheostomy tube stable. Normal heart size, mediastinal contours, and pulmonary vascularity.  Atherosclerotic calcification aorta. BILATERAL pulmonary infiltrates increased since previous exam. Potential layered RIGHT pleural effusion. No pneumothorax or acute osseous findings. IMPRESSION: BILATERAL pulmonary infiltrates slightly greater on RIGHT, increased since previous exam; this could represent pneumonia or less likely asymmetric edema. Questionable layered RIGHT pleural effusion. Electronically Signed   By: Lavonia Dana M.D.   On: 10/31/2019 13:12   US Abdomen Limited RUQ  Result Date: 10/31/2019 CLINICAL DATA:  59 year old male with abnormal LFT. EXAM: ULTRASOUND ABDOMEN LIMITED RIGHT UPPER QUADRANT COMPARISON:  CT abdomen pelvis dated 10/20/2019. FINDINGS: Gallbladder: No gallstones or wall thickening visualized. No sonographic Murphy sign noted by sonographer. Common bile duct: Diameter: 4 mm Liver: No focal lesion identified. Within normal limits in parenchymal echogenicity. Portal vein is patent on color Doppler imaging with normal direction of blood flow towards the liver. Other: Partially visualized small right pleural effusion. IMPRESSION: 1. Unremarkable right upper quadrant. 2. Small right pleural effusion. Electronically Signed   By: Anner Crete M.D.   On: 10/31/2019 20:53     Scheduled Meds: . amLODipine  10 mg Per Tube Daily  . aspirin  81 mg Per Tube Daily  . atorvastatin  80 mg Per Tube Daily  . bethanechol  15 mg Per Tube TID  . chlorhexidine gluconate (MEDLINE KIT)  15 mL Mouth Rinse BID  . Chlorhexidine Gluconate Cloth  6 each Topical Daily  . clopidogrel  75 mg Per Tube Daily  . enoxaparin (LOVENOX) injection  40 mg Subcutaneous Q24H  . feeding supplement (PROSource TF)  45 mL Per Tube BID  . Gerhardt's butt cream   Topical Daily  . insulin aspart  0-15 Units Subcutaneous Q4H  . insulin glargine  12 Units Subcutaneous QHS  . lacosamide  150 mg Per Tube BID  . levETIRAcetam  1,000 mg Per Tube BID  . mouth rinse  15 mL Mouth Rinse q12n4p  . phenytoin  100 mg  Per Tube TID  . QUEtiapine  50 mg Per Tube QHS  . sennosides  5 mL Per Tube BID   Continuous Infusions: . sodium chloride Stopped (10/15/19 1834)  . cefTRIAXone (ROCEPHIN)  IV 2 g (11/01/19 1307)  . feeding supplement (OSMOLITE 1.5 CAL) 1,000 mL (11/01/19 0345)    LOS: 21 days   Kerney Elbe, DO [facility] PAGER is on AMION  If 7PM-7AM, please contact night-coverage [email or web address]

## 2019-11-02 ENCOUNTER — Inpatient Hospital Stay (HOSPITAL_COMMUNITY): Payer: BC Managed Care – PPO

## 2019-11-02 DIAGNOSIS — I611 Nontraumatic intracerebral hemorrhage in hemisphere, cortical: Secondary | ICD-10-CM

## 2019-11-02 DIAGNOSIS — G40901 Epilepsy, unspecified, not intractable, with status epilepticus: Secondary | ICD-10-CM

## 2019-11-02 LAB — CBC WITH DIFFERENTIAL/PLATELET
Abs Immature Granulocytes: 0.03 10*3/uL (ref 0.00–0.07)
Basophils Absolute: 0.1 10*3/uL (ref 0.0–0.1)
Basophils Relative: 1 %
Eosinophils Absolute: 0.5 10*3/uL (ref 0.0–0.5)
Eosinophils Relative: 5 %
HCT: 28.1 % — ABNORMAL LOW (ref 39.0–52.0)
Hemoglobin: 9.2 g/dL — ABNORMAL LOW (ref 13.0–17.0)
Immature Granulocytes: 0 %
Lymphocytes Relative: 18 %
Lymphs Abs: 1.6 10*3/uL (ref 0.7–4.0)
MCH: 29.9 pg (ref 26.0–34.0)
MCHC: 32.7 g/dL (ref 30.0–36.0)
MCV: 91.2 fL (ref 80.0–100.0)
Monocytes Absolute: 0.7 10*3/uL (ref 0.1–1.0)
Monocytes Relative: 8 %
Neutro Abs: 6.1 10*3/uL (ref 1.7–7.7)
Neutrophils Relative %: 68 %
Platelets: 473 10*3/uL — ABNORMAL HIGH (ref 150–400)
RBC: 3.08 MIL/uL — ABNORMAL LOW (ref 4.22–5.81)
RDW: 14.2 % (ref 11.5–15.5)
WBC: 9 10*3/uL (ref 4.0–10.5)
nRBC: 0 % (ref 0.0–0.2)

## 2019-11-02 LAB — COMPREHENSIVE METABOLIC PANEL
ALT: 92 U/L — ABNORMAL HIGH (ref 0–44)
AST: 83 U/L — ABNORMAL HIGH (ref 15–41)
Albumin: 2.4 g/dL — ABNORMAL LOW (ref 3.5–5.0)
Alkaline Phosphatase: 177 U/L — ABNORMAL HIGH (ref 38–126)
Anion gap: 12 (ref 5–15)
BUN: 22 mg/dL — ABNORMAL HIGH (ref 6–20)
CO2: 27 mmol/L (ref 22–32)
Calcium: 8.8 mg/dL — ABNORMAL LOW (ref 8.9–10.3)
Chloride: 98 mmol/L (ref 98–111)
Creatinine, Ser: 1.2 mg/dL (ref 0.61–1.24)
GFR calc Af Amer: >60 mL/min (ref >60)
GFR calc non Af Amer: >60 mL/min (ref >60)
Glucose, Bld: 168 mg/dL — ABNORMAL HIGH (ref 70–99)
Potassium: 3.5 mmol/L (ref 3.5–5.1)
Sodium: 137 mmol/L (ref 135–145)
Total Bilirubin: 0.3 mg/dL (ref 0.3–1.2)
Total Protein: 7.1 g/dL (ref 6.5–8.1)

## 2019-11-02 LAB — GLUCOSE, CAPILLARY
Glucose-Capillary: 170 mg/dL — ABNORMAL HIGH (ref 70–99)
Glucose-Capillary: 173 mg/dL — ABNORMAL HIGH (ref 70–99)
Glucose-Capillary: 177 mg/dL — ABNORMAL HIGH (ref 70–99)
Glucose-Capillary: 199 mg/dL — ABNORMAL HIGH (ref 70–99)
Glucose-Capillary: 209 mg/dL — ABNORMAL HIGH (ref 70–99)
Glucose-Capillary: 214 mg/dL — ABNORMAL HIGH (ref 70–99)
Glucose-Capillary: 222 mg/dL — ABNORMAL HIGH (ref 70–99)

## 2019-11-02 LAB — PHENYTOIN LEVEL, TOTAL: Phenytoin Lvl: 3.5 ug/mL — ABNORMAL LOW (ref 10.0–20.0)

## 2019-11-02 LAB — PHOSPHORUS: Phosphorus: 4.2 mg/dL (ref 2.5–4.6)

## 2019-11-02 LAB — MAGNESIUM: Magnesium: 1.9 mg/dL (ref 1.7–2.4)

## 2019-11-02 IMAGING — DX DG CHEST 1V PORT
1 series · 1 of 1 positions shown · non-contrast
Comparison: [DATE]

CLINICAL DATA: Dyspnea.

EXAM:
PORTABLE CHEST 1 VIEW

[chest ap]
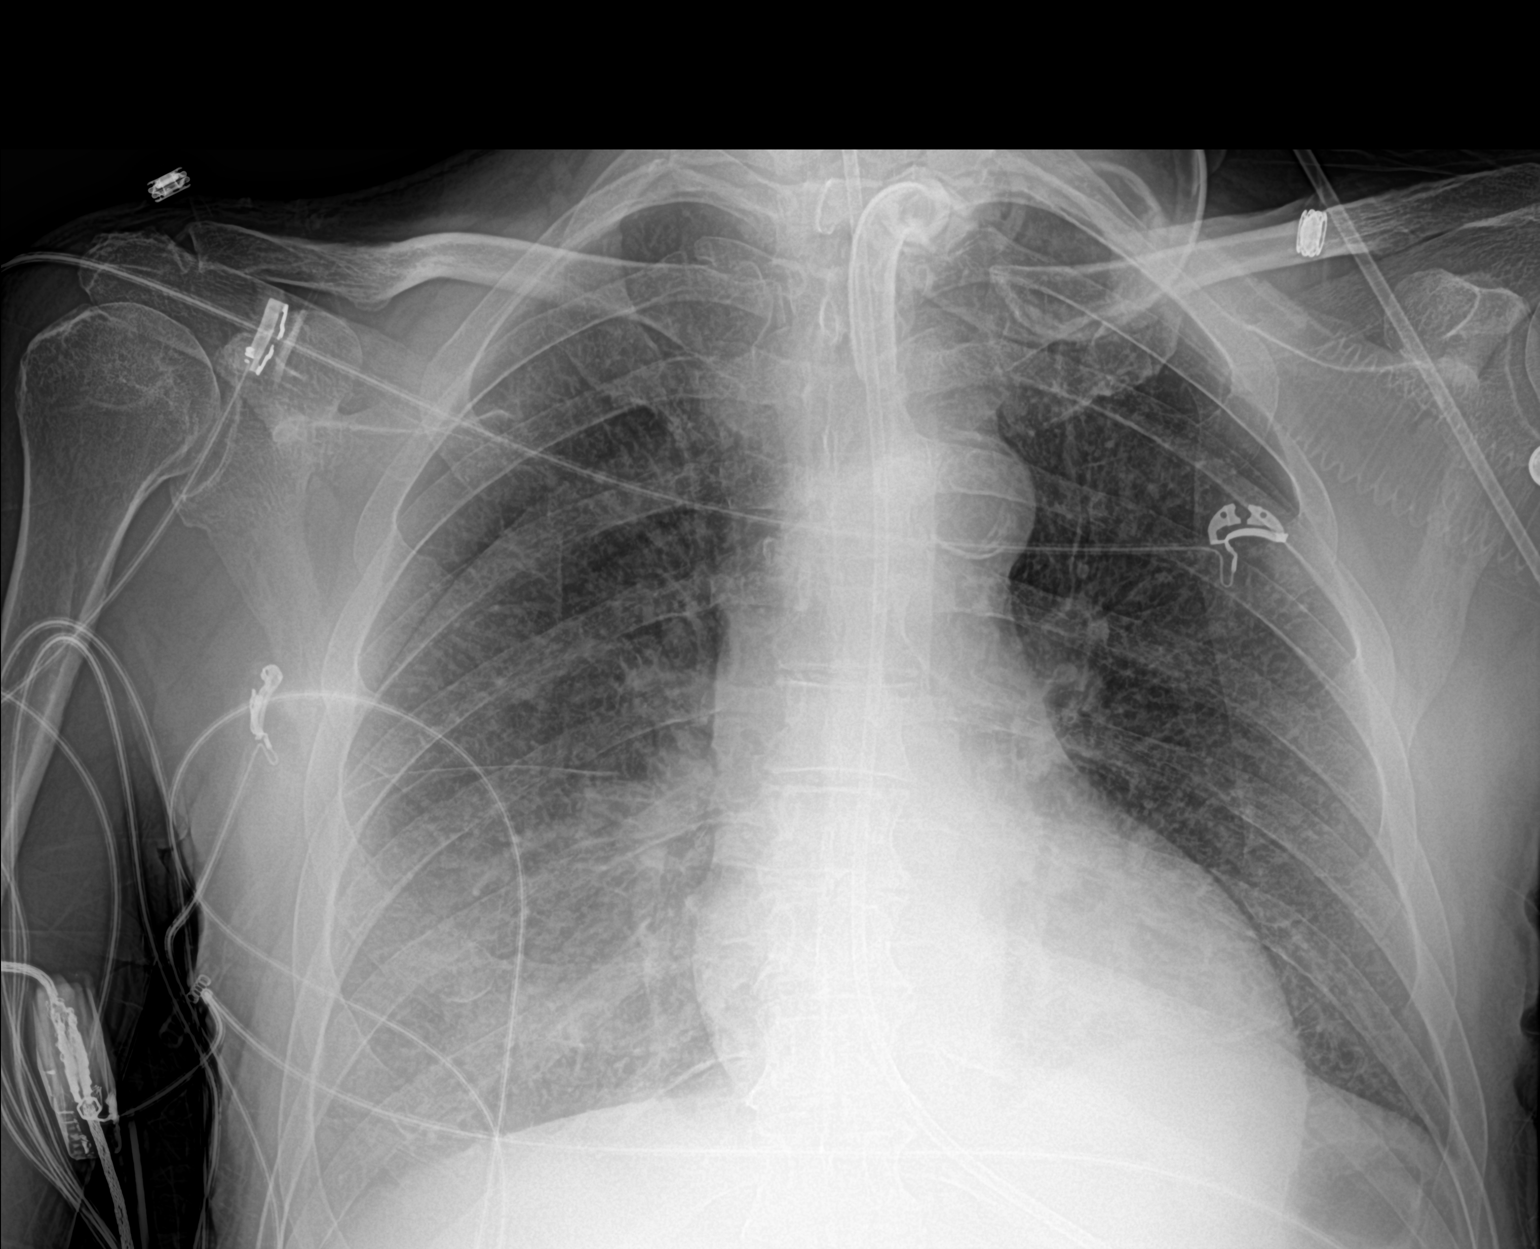

[1 of 1 positions shown; findings below may reference images not displayed]

FINDINGS: Tracheostomy tube and feeding tube remain in place. Heart size
remains within normal limits. Pulmonary hyperinflation is again
seen. Decreased airspace disease is seen in both lung bases.
IMPRESSION: Decreased bibasilar airspace disease.

## 2019-11-02 MED ORDER — PHENYTOIN 125 MG/5ML PO SUSP
150.0000 mg | Freq: Three times a day (TID) | ORAL | Status: DC
  Administered 2019-11-02 – 2019-11-06 (×15): 150 mg
  Filled 2019-11-02 (×17): qty 8

## 2019-11-02 NOTE — Progress Notes (Signed)
Occupational Therapy Treatment Patient Details Name: Tranell Wojtkiewicz MRN: [id number] DOB: 03-26-1960 Today's Date: 11/02/2019    History of present illness 59 y.o. 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  He was intubated for airway protection on 10/10/19. Pt extubated on 10/14/19.  on 8/14, pt had continued twitching of Rt UE with AMS, and decreased 02 sats.  He was intubated.  Diagnosis: status epilepticus in setting of prior large Lt MCA infarct; acute kidney injury, possible acute meningitis.  He underwent tracheostomy on 8/19/2.  PMH includes: poorly controlled DM, dysphagia, HTN   OT comments  Patient continues to make steady progress towards goals in skilled OT session. Patient's session encompassed co-treat with PT in hopes of advancing mobility in session. Pt continues to make progress with bed mobility and transfers, however de-sats and fatigues quickly and requires max encouragement to complete further attempts. Pt would continue to greatly benefit from CIR as pt demonstrates the ability to complete intensive therapies, however is beginning to demonstrate an increased flat affect due to prolonged LOS. Therapy will continue to follow acutely.    Follow Up Recommendations  Supervision/Assistance - 24 hour;CIR    Equipment Recommendations  None recommended by OT    Recommendations for Other Services      Precautions / Restrictions Precautions Precautions: Fall Precaution Comments: trach  Restrictions Weight Bearing Restrictions: No       Mobility Bed Mobility Overal bed mobility: Needs Assistance Bed Mobility: Supine to Sit     Supine to sit: Mod assist     General bed mobility comments: assist to initiate movement, move LEs off bed, lift trunk and scoot hips to EOB with cues. Pt reaching out for therapist's hand and pulling up to sit.   Transfers Overall transfer  level: Needs assistance Equipment used: 2 person hand held assist Transfers: Sit to/from [employer] Sit to Stand: Min assist;Mod assist;+2 physical assistance;+2 safety/equipment Stand pivot transfers: Mod assist;+2 physical assistance;+2 safety/equipment       General transfer comment: Min assist to stand from elevated bed height, and mod assist from low recliner chair. SPT to chair slow and unsteady with sats decreasing into high 70's. +2 required for balance support and safety throughout.     Balance Overall balance assessment: Needs assistance Sitting-balance support: Feet supported Sitting balance-Leahy Scale: Poor Sitting balance - Comments: UE support and close min guard assist    Standing balance support: During functional activity;Single extremity supported Standing balance-Leahy Scale: Poor Standing balance comment: pt dependent on UE support                           ADL either performed or assessed with clinical judgement   ADL       Grooming: Wash/dry face;Set up;Sitting;Wash/dry hands                   Toilet Transfer: Moderate assistance;+2 for safety/equipment;Stand-pivot           Functional mobility during ADLs: Moderate assistance;+2 for physical assistance;+2 for safety/equipment       Vision       Perception     Praxis      Cognition Arousal/Alertness: Awake/alert Behavior During Therapy: Flat affect Overall Cognitive Status: History of cognitive impairments - at baseline  General Comments: pt demonstrates significant communication deficits.  Per brother, pt is at, or close to his baseline.  Cognition difficult to accurately assess         Exercises Exercises: General Lower Extremity General Exercises - Lower Extremity Straight Leg Raises: 10 reps;Both;Supine   Shoulder Instructions       General Comments      Pertinent Vitals/ Pain       Pain  Assessment: Faces Faces Pain Scale: No hurt  Home Living                                          Prior Functioning/Environment              Frequency  Min 2X/week        Progress Toward Goals  OT Goals(current goals can now be found in the care plan section)  Progress towards OT goals: Progressing toward goals  Acute Rehab OT Goals Patient Stated Goal: pt unable to state  OT Goal Formulation: Patient unable to participate in goal setting Time For Goal Achievement: 11/12/19 Potential to Achieve Goals: Good  Plan Discharge plan remains appropriate    Co-evaluation    PT/OT/SLP Co-Evaluation/Treatment: Yes Reason for Co-Treatment: Complexity of the patient's impairments (multi-system involvement);For patient/therapist safety;To address functional/ADL transfers PT goals addressed during session: Mobility/safety with mobility;Balance;Proper use of DME;Strengthening/ROM OT goals addressed during session: ADL's and self-care;Strengthening/ROM;Proper use of Adaptive equipment and DME      AM-PAC OT "6 Clicks" Daily Activity     Outcome Measure   Help from another person eating meals?: Total Help from another person taking care of personal grooming?: A Little Help from another person toileting, which includes using toliet, bedpan, or urinal?: A Lot Help from another person bathing (including washing, rinsing, drying)?: A Lot Help from another person to put on and taking off regular upper body clothing?: A Lot Help from another person to put on and taking off regular lower body clothing?: A Lot 6 Click Score: 12    End of Session Equipment Utilized During Treatment: Gait belt;Oxygen  OT Visit Diagnosis: Hemiplegia and hemiparesis;Cognitive communication deficit (R41.841) Symptoms and signs involving cognitive functions: Cerebral infarction Hemiplegia - Right/Left: Right Hemiplegia - dominant/non-dominant: Dominant Hemiplegia - caused by: Cerebral  infarction   Activity Tolerance Patient tolerated treatment well   Patient Left with call bell/phone within reach;in chair;with chair alarm set   Nurse Communication Mobility status        Time: 5400-8676 OT Time Calculation (min): 34 min  Charges: OT General Charges $OT Visit: 1 Visit OT Treatments $Self Care/Home Management : 8-22 mins  Pollyann Glen E. Embrie Mikkelsen, COTA/L Acute Rehabilitation Services [phone]   Cherlyn Cushing 11/02/2019, 2:51 PM

## 2019-11-02 NOTE — Progress Notes (Signed)
Physical Therapy Treatment Patient Details Name: Corey Bennett MRN: [id number] DOB: [birth year] Today's Date: 11/02/2019    History of Present Illness 59 y.o. 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  He was intubated for airway protection on 10/10/19. Pt extubated on 10/14/19.  on 8/14, pt had continued twitching of Rt UE with AMS, and decreased 02 sats.  He was intubated.  Diagnosis: status epilepticus in setting of prior large Lt MCA infarct; acute kidney injury, possible acute meningitis.  He underwent tracheostomy on 8/19/2.  PMH includes: poorly controlled DM, dysphagia, HTN    PT Comments    Pt progressing towards physical therapy goals. Was able to transition to the chair this session with +2 assist for balance support, safety, and management of lines. Pt's sats dropping to mid-high 70's throughout OOB mobility, with quick return to >90% with seated rest break. Noted pt with blanchable redness on spinal processes and pt confirmed this area was painful. Prophylactic foam dressing applied and RN notified. Feel this pt would be appropriate for continued rehab at the CIR level, and could tolerate the increased intensity of multidisciplinary therapies. Recommendations updated to reflect this. Will continue to follow.    Follow Up Recommendations  CIR     Equipment Recommendations  Wheelchair (measurements PT);Wheelchair cushion (measurements PT)    Recommendations for Other Services       Precautions / Restrictions Precautions Precautions: Fall Precaution Comments: trach  Restrictions Weight Bearing Restrictions: No    Mobility  Bed Mobility Overal bed mobility: Needs Assistance Bed Mobility: Supine to Sit     Supine to sit: Mod assist     General bed mobility comments: assist to initiate movement, move LEs off bed, lift trunk and scoot hips to EOB with cues. Pt reaching  out for therapist's hand and pulling up to sit.   Transfers Overall transfer level: Needs assistance Equipment used: 2 person hand held assist Transfers: Sit to/from [employer] Sit to Stand: Min assist;Mod assist;+2 physical assistance;+2 safety/equipment Stand pivot transfers: Mod assist;+2 physical assistance;+2 safety/equipment       General transfer comment: Min assist to stand from elevated bed height, and mod assist from low recliner chair. SPT to chair slow and unsteady with sats decreasing into high 70's. +2 required for balance support and safety throughout.   Ambulation/Gait                 Stairs             Wheelchair Mobility    Modified Rankin (Stroke Patients Only)       Balance Overall balance assessment: Needs assistance Sitting-balance support: Feet supported Sitting balance-Leahy Scale: Poor Sitting balance - Comments: UE support and close min guard assist    Standing balance support: During functional activity;Single extremity supported Standing balance-Leahy Scale: Poor Standing balance comment: pt dependent on UE support                            Cognition Arousal/Alertness: Awake/alert Behavior During Therapy: Flat affect Overall Cognitive Status: History of cognitive impairments - at baseline                                 General Comments: pt demonstrates significant communication deficits.  Per brother, pt is at,  or close to his baseline.  Cognition difficult to accurately assess       Exercises General Exercises - Lower Extremity Straight Leg Raises: 10 reps;Both;Supine    General Comments        Pertinent Vitals/Pain Pain Assessment: Faces Faces Pain Scale: No hurt    Home Living                      Prior Function            PT Goals (current goals can now be found in the care plan section) Acute Rehab PT Goals Patient Stated Goal: pt unable to state  PT  Goal Formulation: With patient Time For Goal Achievement: 11/16/19 Potential to Achieve Goals: Good Progress towards PT goals: Progressing toward goals    Frequency    Min 3X/week      PT Plan Discharge plan needs to be updated    Co-evaluation PT/OT/SLP Co-Evaluation/Treatment: Yes Reason for Co-Treatment: Complexity of the patient's impairments (multi-system involvement);For patient/therapist safety;To address functional/ADL transfers PT goals addressed during session: Mobility/safety with mobility;Balance;Proper use of DME;Strengthening/ROM        AM-PAC PT "6 Clicks" Mobility   Outcome Measure  Help needed turning from your back to your side while in a flat bed without using bedrails?: A Little Help needed moving from lying on your back to sitting on the side of a flat bed without using bedrails?: A Little Help needed moving to and from a bed to a chair (including a wheelchair)?: A Lot Help needed standing up from a chair using your arms (e.g., wheelchair or bedside chair)?: A Lot Help needed to walk in hospital room?: Total Help needed climbing 3-5 steps with a railing? : Total 6 Click Score: 12    End of Session Equipment Utilized During Treatment: Gait belt Activity Tolerance: Patient tolerated treatment well Patient left: in chair;with call bell/phone within reach;with chair alarm set Nurse Communication: Mobility status PT Visit Diagnosis: Other abnormalities of gait and mobility (R26.89);Other symptoms and signs involving the nervous system (R29.898);Muscle weakness (generalized) (M62.81);Unsteadiness on feet (R26.81);Difficulty in walking, not elsewhere classified (R26.2) Hemiplegia - Right/Left: Right Hemiplegia - caused by: Cerebral infarction     Time: 8416-6063 PT Time Calculation (min) (ACUTE ONLY): 34 min  Charges:  $Gait Training: 8-22 mins                     Conni Slipper, PT, DPT Acute Rehabilitation Services Pager: [phone] Office:  [phone]    Marylynn Pearson 11/02/2019, 2:10 PM

## 2019-11-02 NOTE — Plan of Care (Signed)

## 2019-11-02 NOTE — Consult Note (Addendum)
Physical Medicine and Rehabilitation Consult Reason for Consult: Right side weakness and aphasia Referring Physician: [facility]   HPI: Corey Bennett is a 59 y.o. 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  Presented 10/10/2019 with seizure-like activity requiring intubation.  CT/MRI showed large territory chronic hemorrhagic infarct in the left frontal lobe.  Ill-defined hyper intensity throughout the left basal ganglia and left thalamus.  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  Patient remains on aspirin Plavix for history of CVA.  Subcutaneous Lovenox for DVT prophylaxis.  Keppra and Vimpat for seizure disorder.  Patient did initially require intubation extubated 10/14/2019 however continued to have desaturations into the 80s 15 L nonrebreather mask requiring reintubation and tracheostomy performed 10/22/2019 and presently has a #6 Shiley.  Currently mains n.p.o. and awaiting plan for possible need for PEG tube.  Therapy evaluations completed with recommendations of physical medicine rehab consult.  Initial CVA was in [place] and underwent rehabilitation at [facility] at that time.  Brother reportedly lives in the [place] area  Patient is severely aphasic  Review of Systems  Unable to perform ROS: Acuity of condition   Past Medical History:  Diagnosis Date  . Carotid artery occlusion   . Diabetes (HCC)   . Hypercholesterolemia   . Hypertension   . 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 Stroke [facility])    Past Surgical History:  Procedure Laterality Date  . ENDARTERECTOMY Left 09/08/2019   Procedure: LEFT CAROTID ENDARTERECTOMY;  Surgeon: Chuck Hint, MD;  Location: [facility] OR;   Service: Vascular;  Laterality: Left;  . PATCH ANGIOPLASTY Left 09/08/2019   Procedure: PATCH ANGIOPLASTY USING XENOSURE BIOLOGIC PATCH 1x6cm;  Surgeon: Chuck Hint, MD;  Location: [facility] OR;  Service: Vascular;  Laterality: Left;  . TONSILLECTOMY     No family history on file. Social History:  reports that he has been smoking cigarettes. He has been smoking about 1.00 pack per day. He has never used smokeless tobacco. He reports current alcohol use. He reports that he does not use drugs. Allergies: No Known Allergies Medications Prior to Admission  Medication Sig Dispense Refill  . amLODipine (NORVASC) 10 MG tablet Take 10 mg by mouth daily.     Marland Kitchen aspirin 81 MG chewable tablet Chew 81 mg by mouth daily.    . chlorhexidine (PERIDEX) 0.12 % solution Use as directed 15 mLs in the mouth or throat 2 (two) times daily.    . Cholecalciferol (VITAMIN D-3) 125 MCG (5000 UT) TABS Take 5,000 Units by mouth daily.     . clopidogrel (PLAVIX) 75 MG tablet Take 1 tablet (75 mg total) by mouth daily. 30 tablet 11  . HYDROcodone-acetaminophen (NORCO/VICODIN) 5-325 MG tablet Take 1 tablet by mouth every 6 (six) hours as needed for moderate pain.    Marland Kitchen insulin glargine (LANTUS) 100 UNIT/ML injection Inject 12 Units into the skin at bedtime.    . insulin lispro (HUMALOG) 100 UNIT/ML injection Inject 5 Units into the skin 3 (three) times daily before meals. *HOLD INSULIN IF BLOOD GLUCOSE IS LESS THAN 120.*    . losartan (COZAAR) 25 MG tablet Take 25 mg by mouth daily.     . metFORMIN (GLUCOPHAGE) 1000 MG tablet Take 1,000 mg by mouth 2 (two)  times daily with a meal.     . penicillin v potassium (VEETID) 500 MG tablet Take 500 mg by mouth 4 (four) times daily.    Marland Kitchen senna (SENOKOT) 8.6 MG TABS tablet Take 1 tablet by mouth daily.      Home: Home Living Family/patient expects to be discharged to:: Unsure Type of Home: Assisted living Additional Comments: [facility]F.  Pt resided in AL prior to stroke  06/2019  Functional History: Prior Function Level of Independence: Needs assistance Gait / Transfers Assistance Needed: walking without device at ALF ADL's / Homemaking Assistance Needed: Pt indicates he required some assistance with bathing, dressing, and that he was able to perform toileting mod I.  He also indicates he was able to self feed, and his meals were delivered to his room.  No family present to confirm accuracy of info.  Communication / Swallowing Assistance Needed: expressive and receptive aphasias at baseline per chart review Functional Status:  Mobility: Bed Mobility Overal bed mobility: Needs Assistance Bed Mobility: Supine to Sit Rolling: Min assist Sidelying to sit: Mod assist Supine to sit: Mod assist Sit to supine: Min assist, +2 for safety/equipment General bed mobility comments: assist to initiate movement, move LEs off bed, lift trunk and scoot hips to EOB with cues. Transfers Overall transfer level: Needs assistance Equipment used: 2 person hand held assist Transfer via Lift Equipment: Stedy Transfers: Sit to/from Stand, [employer] Transfers Sit to Stand: Mod assist, +2 physical assistance, +2 safety/equipment Stand pivot transfers: Mod assist, +2 physical assistance, +2 safety/equipment Squat pivot transfers: Mod assist General transfer comment: Patient with right LE buckling during weight shifting to get to recliner. Ambulation/Gait Ambulation/Gait assistance: Mod assist, +2 physical assistance, +2 safety/equipment Gait Distance (Feet): 3 Feet Assistive device: 2 person hand held assist Gait Pattern/deviations: Step-to pattern, Decreased stride length, Decreased dorsiflexion - right, Trunk flexed, Shuffle, Narrow base of support General Gait Details: Able to take a few steps forward with right knee, attempted to take a few steps backward to recliner, unable to move right LE backward, requiring max assist +2 to safely get back into recliner. Gait velocity:  decr    ADL: ADL Overall ADL's : Needs assistance/impaired Eating/Feeding: NPO Eating/Feeding Details (indicate cue type and reason): requires assist for thoroughness when washing face  Grooming: Wash/dry face, Set up, Sitting Upper Body Bathing: Moderate assistance, Sitting Lower Body Bathing: Moderate assistance, Sit to/from stand Upper Body Dressing : Moderate assistance, Sitting Lower Body Dressing: Moderate assistance, Sit to/from stand Lower Body Dressing Details (indicate cue type and reason): Pt able to don socks with min A to get sock started over toes and for balance  Toilet Transfer: Moderate assistance, +2 for safety/equipment, Stand-pivot, BSC Toileting- Clothing Manipulation and Hygiene: Total assistance, Sit to/from stand Toileting - Clothing Manipulation Details (indicate cue type and reason): flexi seal leaking and pt was assisted with peri care in standing  Functional mobility during ADLs: Moderate assistance, +2 for physical assistance, +2 for safety/equipment  Cognition: Cognition Overall Cognitive Status: History of cognitive impairments - at baseline Arousal/Alertness: Awake/alert Orientation Level: Oriented to person, Oriented to place Cognition Arousal/Alertness: Awake/alert Behavior During Therapy: Flat affect Overall Cognitive Status: History of cognitive impairments - at baseline General Comments: pt demonstrates significant communication deficits.  Per brother, pt is at, or close to his baseline.  Cognition difficult to accurately assess  Difficult to assess due to: Tracheostomy  Blood pressure (!) 161/72, pulse 80, temperature 98 F (36.7 C), temperature source Axillary, resp.  rate (!) 25, height 6\' 1"  (1.854 m), weight 75.6 kg, SpO2 100 %. Physical Exam Vitals and nursing note reviewed.  Constitutional:      Appearance: He is normal weight.  HENT:     Head: Normocephalic and atraumatic.     Comments: Nasogastric tube in place Eyes:      Extraocular Movements: Extraocular movements intact.     Conjunctiva/sclera: Conjunctivae normal.     Pupils: Pupils are equal, round, and reactive to light.  Neck:     Comments: Tracheostomy tube in place Cardiovascular:     Rate and Rhythm: Normal rate and regular rhythm.     Heart sounds: Normal heart sounds. No murmur heard.   Abdominal:     General: Abdomen is flat. Bowel sounds are normal. There is no distension.     Palpations: Abdomen is soft. There is no mass.  Skin:    General: Skin is warm and dry.  Neurological:     Mental Status: He is alert.     Comments: Patient is alert sitting at edge of bed.  Makes eye contact with examiner. follows basic commands. Severe aphasia mainly expressive, only nods head yes Cannot assess sensation other than to pinch due to aphasia, winces to pinch in all 4 extremities Cannot assess coordination in the right upper extremity secondary to severe weakness Motor strength 0/5 in the right deltoid bicep tricep grip 3 - at the right hip flexor knee extensor ankle dorsiflexor Left upper extremity and left lower extremity 5/5     Results for orders placed or performed during the hospital encounter of 10/10/19 (from the past 24 hour(s))  Glucose, capillary     Status: Abnormal   Collection Time: 11/01/19 12:43 PM  Result Value Ref Range   Glucose-Capillary 226 (H) 70 - 99 mg/dL  Glucose, capillary     Status: Abnormal   Collection Time: 11/01/19  3:53 PM  Result Value Ref Range   Glucose-Capillary 258 (H) 70 - 99 mg/dL   Comment 1 Notify RN    Comment 2 Document in Chart   Glucose, capillary     Status: Abnormal   Collection Time: 11/01/19  7:31 PM  Result Value Ref Range   Glucose-Capillary 207 (H) 70 - 99 mg/dL  Glucose, capillary     Status: Abnormal   Collection Time: 11/01/19  9:05 PM  Result Value Ref Range   Glucose-Capillary 235 (H) 70 - 99 mg/dL  Glucose, capillary     Status: Abnormal   Collection Time: 11/01/19 11:49 PM    Result Value Ref Range   Glucose-Capillary 214 (H) 70 - 99 mg/dL  Phenytoin level, total     Status: Abnormal   Collection Time: 11/02/19  1:29 AM  Result Value Ref Range   Phenytoin Lvl 3.5 (L) 10.0 - 20.0 ug/mL  Comprehensive metabolic panel     Status: Abnormal   Collection Time: 11/02/19  1:29 AM  Result Value Ref Range   Sodium 137 135 - 145 mmol/L   Potassium 3.5 3.5 - 5.1 mmol/L   Chloride 98 98 - 111 mmol/L   CO2 27 22 - 32 mmol/L   Glucose, Bld 168 (H) 70 - 99 mg/dL   BUN 22 (H) 6 - 20 mg/dL   Creatinine, Ser 11/04/19 0.61 - 1.24 mg/dL   Calcium 8.8 (L) 8.9 - 10.3 mg/dL   Total Protein 7.1 6.5 - 8.1 g/dL   Albumin 2.4 (L) 3.5 - 5.0 g/dL   AST 83 (H) 15 -  41 U/L   ALT 92 (H) 0 - 44 U/L   Alkaline Phosphatase 177 (H) 38 - 126 U/L   Total Bilirubin 0.3 0.3 - 1.2 mg/dL   GFR calc non Af Amer >60 >60 mL/min   GFR calc Af Amer >60 >60 mL/min   Anion gap 12 5 - 15  CBC with Differential/Platelet     Status: Abnormal   Collection Time: 11/02/19  1:29 AM  Result Value Ref Range   WBC 9.0 4.0 - 10.5 K/uL   RBC 3.08 (L) 4.22 - 5.81 MIL/uL   Hemoglobin 9.2 (L) 13.0 - 17.0 g/dL   HCT 06.2 (L) 39 - 52 %   MCV 91.2 80.0 - 100.0 fL   MCH 29.9 26.0 - 34.0 pg   MCHC 32.7 30.0 - 36.0 g/dL   RDW 37.6 28.3 - 15.1 %   Platelets 473 (H) 150 - 400 K/uL   nRBC 0.0 0.0 - 0.2 %   Neutrophils Relative % 68 %   Neutro Abs 6.1 1.7 - 7.7 K/uL   Lymphocytes Relative 18 %   Lymphs Abs 1.6 0.7 - 4.0 K/uL   Monocytes Relative 8 %   Monocytes Absolute 0.7 0 - 1 K/uL   Eosinophils Relative 5 %   Eosinophils Absolute 0.5 0 - 0 K/uL   Basophils Relative 1 %   Basophils Absolute 0.1 0 - 0 K/uL   Immature Granulocytes 0 %   Abs Immature Granulocytes 0.03 0.00 - 0.07 K/uL  Magnesium     Status: None   Collection Time: 11/02/19  1:29 AM  Result Value Ref Range   Magnesium 1.9 1.7 - 2.4 mg/dL  Phosphorus     Status: None   Collection Time: 11/02/19  1:29 AM  Result Value Ref Range   Phosphorus  4.2 2.5 - 4.6 mg/dL  Glucose, capillary     Status: Abnormal   Collection Time: 11/02/19  4:07 AM  Result Value Ref Range   Glucose-Capillary 177 (H) 70 - 99 mg/dL  Glucose, capillary     Status: Abnormal   Collection Time: 11/02/19  8:01 AM  Result Value Ref Range   Glucose-Capillary 173 (H) 70 - 99 mg/dL   DG CHEST PORT 1 VIEW  Result Date: 11/02/2019 CLINICAL DATA:  Dyspnea. EXAM: PORTABLE CHEST 1 VIEW COMPARISON:  10/31/2019 FINDINGS: Tracheostomy tube and feeding tube remain in place. Heart size remains within normal limits. Pulmonary hyperinflation is again seen. Decreased airspace disease is seen in both lung bases. IMPRESSION: Decreased bibasilar airspace disease. Electronically Signed   By: Danae Orleans M.D.   On: 11/02/2019 08:09   US Abdomen Limited RUQ  Result Date: 10/31/2019 CLINICAL DATA:  59 year old male with abnormal LFT. EXAM: ULTRASOUND ABDOMEN LIMITED RIGHT UPPER QUADRANT COMPARISON:  CT abdomen pelvis dated 10/20/2019. FINDINGS: Gallbladder: No gallstones or wall thickening visualized. No sonographic Murphy sign noted by sonographer. Common bile duct: Diameter: 4 mm Liver: No focal lesion identified. Within normal limits in parenchymal echogenicity. Portal vein is patent on color Doppler imaging with normal direction of blood flow towards the liver. Other: Partially visualized small right pleural effusion. IMPRESSION: 1. Unremarkable right upper quadrant. 2. Small right pleural effusion. Electronically Signed   By: Elgie Collard M.D.   On: 10/31/2019 20:53     Assessment/Plan: Diagnosis: Left MCA distribution infarct with right hemiparesis and severe aphasia 1. Does the need for close, 24 hr/day medical supervision in concert with the patient's rehab needs make it unreasonable for this patient  to be served in a less intensive setting? Yes 2. Co-Morbidities requiring supervision/potential complications: Diabetes, hypertension, severe dysphagia, seizure disorder 3. Due  to bladder management, bowel management, safety, skin/wound care, disease management, medication administration, pain management and patient education, does the patient require 24 hr/day rehab nursing? Yes 4. Does the patient require coordinated care of a physician, rehab nurse, therapy disciplines of PT, OT, speech to address physical and functional deficits in the context of the above medical diagnosis(es)? Yes Addressing deficits in the following areas: balance, endurance, locomotion, strength, transferring, bowel/bladder control, bathing, dressing, toileting, cognition and psychosocial support 5. Can the patient actively participate in an intensive therapy program of at least 3 hrs of therapy per day at least 5 days per week? Yes 6. The potential for patient to make measurable gains while on inpatient rehab is good 7. Anticipated functional outcomes upon discharge from inpatient rehab are supervision  with PT, supervision with OT, min assist with SLP. 8. Estimated rehab length of stay to reach the above functional goals is: 14 to 18 days 9. Anticipated discharge destination: Resides at [facility] 10. Overall Rehab/Functional Prognosis: Fair  RECOMMENDATIONS: This patient's condition is appropriate for continued rehabilitative care in the following setting: CIR Patient has agreed to participate in recommended program. Patient indicates with gestures that he is not sure he wants to come to rehab in the hospital Note that insurance prior authorization may be required for reimbursement for recommended care.  Comment: Social work to determine whether [facility] has varying degrees of assistance such as SNF vs ALF, could potentially come to CIR then to SNF, see above   Charlton AmorDaniel J Angiulli, PA-C 11/02/2019   "I have personally performed a face to face diagnostic evaluation of this patient.  Additionally, I have reviewed and concur with the physician assistant's documentation above." Erick ColaceAndrew E.  Nysa Sarin M.D. [facility] FAAPM&R (Neuromuscular Med) Diplomate Am Board of Electrodiagnostic Med Fellow Am Board of Interventional Pain

## 2019-11-02 NOTE — Progress Notes (Signed)
  Speech Language Pathology Treatment: Dysphagia;Passy Muir Speaking valve  Patient Details Name: Corey Bennett MRN: [id number] DOB: 10/21/1960 Today's Date: 11/02/2019 Time: 1212-1220 SLP Time Calculation (min) (ACUTE ONLY): 8 min  Assessment / Plan / Recommendation Clinical Impression  Pt demonstrates severe dyspahgia. He has little to no lingual movement and despite being attentive to PO trials, he appears to attempt to elicit a swallow, but is unable to do so. He cannot manage his own secretions. Larger more mobile boluses that are expected to transit with little oral effort lead to immediate severe coughing. Coughing is dysphonic and phonation is also poor with PMSV in plce despite effort. Pt would a cuffles trach for improved direction of airflow and breath support for cough and phonation. At this point, MBS would give more information about dysphagia, but would not result in diet initiation. Pt may do slightly better with PMSV. Will make this request and update the MD.   HPI        SLP Plan  Continue with current plan of care       Recommendations  Diet recommendations: NPO      Patient may use Passy-Muir Speech Valve: Intermittently with supervision PMSV Supervision: Full         Follow up Recommendations: Skilled Nursing facility SLP Visit Diagnosis: Dysphagia, oropharyngeal phase (R13.12);Aphonia (R49.1) Plan: Continue with current plan of care       GO                Chelesa Weingartner, Riley Nearing 11/02/2019, 12:35 PM

## 2019-11-02 NOTE — TOC Progression Note (Signed)
Transition of Care [facility]) - Progression Note    Patient Details  Name: Corey Bennett MRN: [id number] Date of Birth: [birth year]  [facility] CM/SW Contact  Baldemar Lenis, [place] Phone Number: 11/02/2019, 2:58 PM  Clinical Narrative:   Patient continues to have no bed offers for SNF at this time. Still not medically ready, has cortrak and needs trach change. OT and RN recommending CIR, asked MD to place CIR consult. Per discussion with brother, he had done an inpatient rehab after his previous stroke and did really well. CSW to follow.    Expected Discharge Plan: Skilled Nursing Facility Barriers to Discharge: English as a second language teacher, Continued Medical Work up  Expected Discharge Plan and Services Expected Discharge Plan: Skilled Nursing Facility     Post Acute Care Choice: Skilled Nursing Facility Living arrangements for the past 2 months: Assisted Living Facility                                       Social Determinants of Health (SDOH) Interventions    Readmission Risk Interventions No flowsheet data found.

## 2019-11-02 NOTE — Progress Notes (Signed)
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 Patient is living in a assisted living facility and can ambulate by himself. Patient was found by staff to have a right sided twitching with right eye deviation. He was given Versed and Dilantin and was intubated for airway protection.  Patient was extubated on 8/11 and his seizures was controlled with Keppra, phenytoin, and Vimpat.  He was transferred to 3W. for medical management.  On 8/14, patient continued to have seizure with twitching of the right upper extremity.  2 mg of lorazepam was given.  Patient continued to desat in the 80s on 15 L of nonrebreather mask with altered mental status.  Patient was intubated to protect airway and transferred to the ICU.  Past Medical History  Left MCA stroke with residual aphasia and right hemiparesis CEA Hypertension Hypercholesterolemia Diabetes   Significant Hospital Events   8/08intubated in ED 8/11 extubation 8/14 reintubation 8/21 transfusion 1 unit PRBC 8/26 to [facility]  8/30 PCCM asked regarding appropriateness of downsize of trach  Consults:  Neurology Speech Pathology   Procedures:  8/8 ETT 8/14 ETT 08/19 Tracheostomy  8/23 PEG tube  Significant Diagnostic Tests:  CT head 8/8 >> No acute intracranial abnormality. Large area of encephalomalacia throughout the left MCA territory, consistent with remote infarct. UDS 8/8 >> + benzo (but given versed in ED); o/w negative MRI of brain8/8 >>large territory chronic hemorrhagic infarct in the left frontal lobe.Ill-defined hyperintensity throughout the left basal ganglia and left  thalamus with mild enhancement in dobutamine likely secondary to encephalitis, infiltrating tumor or atypical subacute infarction MRI of face and trigeminal region8/8 >>rules out soft tissue abscess CXR 8/17 >>improved aeration to the right lung base  CT Abd w/o contrast 8/17 >> cholelithiasis/trace ascites/aorthic atherosclerosis  Micro Data:  8/8 Sars-CoV-2 >> negative 8/8 BCx2 >> no growth 8/8 UC >>Klebsiella pneumonia 08/14 BCx >> negative 08/14 Trach Cx >> gram positive rods and cocci in pairs 08/17 Trach Cx >> negative   Antimicrobials:  Vancomycin 8/8 >> 8/12 Ceftriaxone 8/8 >> 8/12 Ampicillin 8/8 >> 8/11 Zosyn 8/14 >> 08/21 Vancomycin 08/14 >> 08/16  Interim history/subjective:  Re-evaluated for possible trach change.  Trach placed 8/19 Pt up to chair  On 5L / 28% ATC  Objective   Blood pressure (!) 156/72, pulse 80, temperature 98.7 F (37.1 C), temperature source Oral, resp. rate 20, height 6\' 1"  (1.854 m), weight 75.6 kg, SpO2 100 %.    FiO2 (%):  [28 %] 28 %   Intake/Output Summary (Last 24 hours) at 11/02/2019 1339 Last data filed at 11/02/2019 1000 Gross per 24 hour  Intake 2920 ml  Output 1645 ml  Net 1275 ml   Filed Weights   10/31/19 0420 11/01/19 0342 11/02/19 0414  Weight: 76.5 kg 76.7 kg 75.6 kg    Examination: General: chronically ill appearing adult male sitting up in chair  HEENT: MM pink/moist, anicteric, #6 cuffed trach midline, pt able to cough / clear secretions, right sided facial droop with drool on side of face, wet voice quality with PMV use Neuro: Awake, alert, follows commands, oriented/interactive, moves left side w/o difficulty, right sided hemiparesis  CV: s1s2 RRR, no  m/r/g PULM: non-labored on ATC 5L / 28%, lungs bilaterally clear, thin secretions from trach site  GI: soft, bsx4 active  Extremities: warm/dry, no edema  Skin: no rashes or lesions  Assessment & Plan:   Chronic respiratory failure with hypoxemia requiring  tracheostomy tube placement, mechanical ventilatory support in setting of large LEFT MCA CVA with status epilepticus. -continue trach collar with O2 to support saturations >90% -pulmonary hygiene -mobilize, coughing / deep breathing  -tracheal suctioning PRN  -follow intermittent CXR -will ask RT to change to #6 cuffless trach but do not anticipate this will change his swallowing abilities in the near future.  He has a very wet voice quality with PMV use.   Dysphagia.  -SLP following -PEG deferred at patients request  -TF via Cortrak -aspiration precautions   Status epilepticus in the setting of recent large left MCA stroke Hypertension Urinary Retention Diabetes type 2 with hyperglycemia  -per primary team   Best practice:  Diet: TF Pain/Anxiety/Delirium protocol (if indicated): n/a VAP protocol (if indicated): n/a DVT prophylaxis: enoxaparin GI prophylaxis: PPI Glucose control: SSI/ Lantus Mobility: BR Code Status: Full Family Communication: per primary   Disposition: PCU, per TRH. PCCM will continue to follow for trach needs.   Labs   CBC: Recent Labs  Lab 10/29/19 0924 10/30/19 0429 10/31/19 0147 11/01/19 0927 11/02/19 0129  WBC 14.9* 14.5* 14.2* 9.9 9.0  NEUTROABS 12.7* 12.2* 11.7* 7.8* 6.1  HGB 9.2* 8.7* 8.7* 8.9* 9.2*  HCT 27.7* 26.7* 25.9* 27.2* 28.1*  MCV 91.7 92.7 91.8 91.0 91.2  PLT 529* 478* 454* 447* 473*    Basic Metabolic Panel: Recent Labs  Lab 10/28/19 0812 10/29/19 0456 10/29/19 0924 10/30/19 0429 10/31/19 0147 11/01/19 0927 11/02/19 0129  NA  --  138  --  136 137 135 137  K  --  3.9  --  3.6 3.6 3.8 3.5  CL  --  102  --  102 99 98 98  CO2  --  26  --  26 26 26 27   GLUCOSE  --  229*  --  209* 222* 263* 168*  BUN  --  16  --  20 19 22* 22*  CREATININE  --  1.13  --  1.15 1.26* 1.23 1.20  CALCIUM  --  8.5*  --  8.7* 8.7* 8.6* 8.8*  MG   < >  --  1.8 1.8 1.8 1.8 1.9  PHOS  --   --  3.5 3.7 4.1 3.7 4.2   < > = values in this interval  not displayed.   GFR: Estimated Creatinine Clearance: 70.9 mL/min (by C-G formula based on SCr of 1.2 mg/dL). Recent Labs  Lab 10/30/19 0429 10/30/19 1343 10/30/19 1645 10/31/19 0147 11/01/19 0128 11/01/19 0927 11/02/19 0129  PROCALCITON  --  <0.10  --  <0.10 <0.10  --   --   WBC 14.5*  --   --  14.2*  --  9.9 9.0  LATICACIDVEN  --  1.2 1.2  --   --   --   --     Liver Function Tests: Recent Labs  Lab 10/29/19 0456 10/30/19 0429 10/31/19 0147 11/01/19 0927 11/02/19 0129  AST 37 41 80* 80* 83*  ALT 45* 46* 74* 85* 92*  ALKPHOS 137* 147* 148* 154* 177*  BILITOT 0.3 0.3 0.3 0.3 0.3  PROT 6.5 6.8 6.6 7.0 7.1  ALBUMIN 2.3* 2.4* 2.2* 2.3* 2.4*   No results for input(s): LIPASE, AMYLASE in the last 168  hours. No results for input(s): AMMONIA in the last 168 hours.  ABG    Component Value Date/Time   PHART 7.339 (L) 10/17/2019 1231   PCO2ART 36.7 10/17/2019 1231   PO2ART 117 (H) 10/17/2019 1231   HCO3 19.6 (L) 10/17/2019 1231   TCO2 21 (L) 10/17/2019 1231   ACIDBASEDEF 5.0 (H) 10/17/2019 1231   O2SAT 98.0 10/17/2019 1231     Coagulation Profile: No results for input(s): INR, PROTIME in the last 168 hours.  Cardiac Enzymes: No results for input(s): CKTOTAL, CKMB, CKMBINDEX, TROPONINI in the last 168 hours.  HbA1C: Hgb A1c MFr Bld  Date/Time Value Ref Range Status  09/04/2019 10:00 AM 7.2 (H) 4.8 - 5.6 % Final    Comment:    (NOTE) Pre diabetes:          5.7%-6.4%  Diabetes:              >6.4%  Glycemic control for   <7.0% adults with diabetes     CBG: Recent Labs  Lab 11/01/19 2105 11/01/19 2349 11/02/19 0407 11/02/19 0801 11/02/19 1204  GLUCAP 235* 214* 177* 173* 170*   35 min spent with >50% time in counseling and coordination of care.   Lynnell Catalan, MD [facility] ICU Physician [facility]  Pager: [phone] Mobile: [phone] After hours: [phone].  11/02/2019, 1:39 PM

## 2019-11-02 NOTE — TOC Initial Note (Signed)
LATE NOTE SUBMISSION   Transition of Care [facility]) - Initial/Assessment Note    Patient Details  Name: Corey Bennett MRN: [id number] Date of Birth: April 10, 1960  [facility] CM/SW Contact:    Geralynn Ochs, LCSW Phone Number: 11/02/2019, 2:55 PM  Clinical Narrative:     CSW met with brother, [name], at bedside to discuss SNF recommendation. [name] in agreement, agreeable to fax out in [place]. CSW sent referrals out. CSW received call later from [facility] in [place], that family wanted the patient placed out there. CSW contacted [name] to ask, as he had not mentioned anything previously, and said that the family is pursuing all possible options. CSW sent referral to [facility].              Expected Discharge Plan: Skilled Nursing Facility Barriers to Discharge: Insurance Authorization, Continued Medical Work up   Patient Goals and CMS Choice Patient states their goals for this hospitalization and ongoing recovery are:: patient unable to state goals CMS Medicare.gov Compare Post Acute Care list provided to:: Patient Represenative (must comment) Choice offered to / list presented to : Sibling  Expected Discharge Plan and Services Expected Discharge Plan: [place] Choice: [place] arrangements for the past 2 months: [place]                                      Prior Living Arrangements/Services Living arrangements for the past 2 months: [place] Lives with:: Facility Resident Patient language and need for interpreter reviewed:: No Do you feel safe going back to the place where you live?: Yes      Need for Family Participation in Patient Care: Yes (Comment) Care giver support system in place?: No (comment) Current home services: DME Criminal Activity/Legal Involvement Pertinent to Current Situation/Hospitalization: No - Comment as needed  Activities of Daily  Living Home Assistive Devices/Equipment: None ADL Screening (condition at time of admission) Patient's cognitive ability adequate to safely complete daily activities?: No Is the patient deaf or have difficulty hearing?: No Does the patient have difficulty seeing, even when wearing glasses/contacts?: No Does the patient have difficulty concentrating, remembering, or making decisions?: Yes Patient able to express need for assistance with ADLs?: No Does the patient have difficulty dressing or bathing?: Yes Independently performs ADLs?: No Communication: Dependent Is this a change from baseline?: Change from baseline, expected to last <3 days Dressing (OT): Dependent Is this a change from baseline?: Change from baseline, expected to last <3days Grooming: Dependent Is this a change from baseline?: Change from baseline, expected to last <3 days Feeding: Dependent Is this a change from baseline?: Change from baseline, expected to last <3 days Bathing: Dependent Is this a change from baseline?: Change from baseline, expected to last <3 days Toileting: Dependent Is this a change from baseline?: Change from baseline, expected to last <3 days In/Out Bed: Dependent Is this a change from baseline?: Change from baseline, expected to last <3 days Walks in Home: Dependent Is this a change from baseline?: Change from baseline, expected to last <3 days Does the patient have difficulty walking or climbing stairs?: No Weakness of Legs: Both Weakness of Arms/Hands: Both  Permission Sought/Granted Permission sought to share information with : Facility Sport and exercise psychologist, Family Supports Permission granted to share information with : Yes, Verbal Permission Granted  Share Information with NAME: Herbie Drape  Permission granted to share info w AGENCY: SNF  Permission granted to share info w Relationship: Brothers     Emotional Assessment   Attitude/Demeanor/Rapport: Unable to Assess Affect  (typically observed): Unable to Assess        Admission diagnosis:  Seizure ([place]) [R56.9] Altered mental status, unspecified altered mental status type [R41.82] Patient Active Problem List   Diagnosis Date Noted  . 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 AKI (acute kidney injury) ([place]) 10/16/2019  . Essential hypertension 10/16/2019  . Type 2 diabetes mellitus with hyperlipidemia ([place]) 10/16/2019  . Seizure ([place]) 10/11/2019  . Altered mental status   . Status epilepticus ([place])   . Carotid artery stenosis 09/08/2019   PCP:  Patient, No Pcp Per Pharmacy:  No Pharmacies Listed    Social Determinants of Health (SDOH) Interventions    Readmission Risk Interventions No flowsheet data found.

## 2019-11-02 NOTE — Plan of Care (Signed)
  Problem: Activity: Goal: Ability to tolerate increased activity will improve Outcome: Progressing   Problem: Education: Goal: Knowledge of General Education information will improve Description: Including pain rating scale, medication(s)/side effects and non-pharmacologic comfort measures Outcome: Progressing   

## 2019-11-02 NOTE — Progress Notes (Signed)
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 Patient is living in a assisted living facility and can ambulate by himself. Patient was found by staff to have a right sided twitching with right eye deviation. He was given Versed and Dilantin and was intubated for airway protection.  Patient was extubated on 8/11 and his seizures was controlled with Keppra, phenytoin, and Vimpat.He was transferred to [address] for medical management  On 8/14,patient continued to have seizure with twitching of the right upper extremity. 2 mg of lorazepam was given. Patient continued to desat in the 80s on 15 L of nonrebreather mask with altered mental status. Patient was re intubated to protect airway and transferred to the ICU. On 10/22/2019 he had a regular tracheostomy and 8/23 was going to get a PEG tube placement but this was deferred to his request and he ended up getting a Cortrak placement and is currently getting tube feeds.  Currently he has failed his swallowing significantly and SLP is following closely.  We will continue to follow swallowing rehabilitation prior to PEG tube placement as SLP recommend strict n.p.o.  On 8/27/21he failed his swallow study again and I discussed with him about PEG tube placement and he wants to wait the weekend and if he feels he can monitor that he will likely be agreeable.  10/31/19 Started coughing significantly and trying to cough up some sputum.  Nurse was in the return to suction him.  His LFTs were abnormal so discussed with him about obtaining ultrasound and he is agreeable.  Given a dose of Lasix yesterday.  11/01/19 Respiratory Status is a little improved. LFTs remain elevated but  are stable. No recurrent fevers and U/A showing Hematuria. He has some abdominal pain.   11/02/19 Patient still failed his swallow test and the speech therapist recommended trying to get his trach to a cuffless so they can do MBS for better evaluation.  Pulmonary was asked to see the patient and they feel that he is okay to change to a cuffless #6 trach but that may not be enough to help his dysphagia as they feel that is neurologically mediated.  Will consult IR for further evaluation and management for possible PEG tube.  PT OT recommending CIR so we have consulted inpatient rehab.  We will go up on his phenytoin given his subtherapeutic dosing  Assessment & Plan:   Principal Problem:   Status epilepticus ([place]) Active Problems:   Carotid artery stenosis   Seizure (HCC)   CVA (cerebrovascular accident due to intracerebral hemorrhage) (HCC)/ left frontal lobe   AKI (acute kidney injury) ([place])   Essential hypertension   Type 2 diabetes mellitus with hyperlipidemia (HCC)  Chronic respiratory failure with hypoxemia requiring tracheostomy tube placement and mechanical ventilatory support. -Trach collar placed 8/23, remains in place -SpO2: 92 % O2 Flow Rate (L/min): 5 L/min FiO2 (%): 28 %; Wean as tolerated  -Continue with duo nebs 3 mL nebulizer every 6 hours as needed for wheezing or shortness of breath -Speech path to reevaluate swallow and he failed again -Oral suction with tube care  -Transferred to PCU -CXR 8/27 showed "Interim removal of left PICC line. Tracheostomy tube and feeding tube in stable position. Cardiomegaly. Diffuse bilateral interstitial  prominence noted on today's exam. Small right pleural effusion noted on today's exam. CHF could present in this fashion. Pneumonitis cannot be excluded." -Repeat CXR 8/28 showed "BILATERAL pulmonary infiltrates slightly greater on RIGHT, increased since previous exam; this could represent pneumonia or less likely asymmetric edema.  Questionable layered RIGHT pleural effusion." -Patient was given 2 doses of IV Lasix. -Chest x-ray showed decreased bibasilar airspace disease today.  Fever, improved -Unclear etiology and is 101.9 and now improved  -Chest x-ray as above -Patient's lactic acid level is 1.2 and procalcitonin level was less than 0.10 -He does have a leukocytosis and his WBC peaked to 14.9 and is now slowly improving and 14.2 -Check urinalysis given that he is having acute urinary retention to rule out a UTI -Blood cultures x2 ordered and showed NGTD 3 Days -Urinalysis done but Urine Cx not sent -U/A showed a hazy appearance with large hemoglobin, moderate leukocytes, rare bacteria, present budding yeast, greater than 50 RBCs per high-power field and 0-5 WBCs -Continue monitor temperature curve and repeat CBC in a.m. -Empirically start IV ceftriaxone  Status epilepticus in the setting of recent large left MCA stroke -No arm/eye twitching since ICU transfer -Continue current AED regimen/ Dilantin and Vimpat, transition to PO via Cortrak and po when able to swallow; Patient keeps failing his swallow studies and discussed with him and he wants to wait until Monday to re-evaluate -His phenytoin level was low even for being corrected so we will go up on his phenytoin dose today  Hypertension -BP this Afternoon is 140/61 -Avoid initiation of ARB in setting of AKI. -Restarted home Norovasc 5 mg on 8/23, transition all meds PO, SBP <160 -Continue with labetalol 5 mg IV every 2 as needed for high blood pressure of SBP of greater than 180.  Urinary Retention - Continue Benechol - Removed Foley however had to be reinserted the night before last as he was retaining and had 523 mL in his bladder -Check Urinalysis and Urine Cx and urinalysis was done but urine culture is still not sent -Will need to send   AKI, improved -Improved. BUN/creatinine peaked at 29/2.12 and currently stable at 22/1.20 -Avoid further  nephrotoxic medications, contrast dyes, hypotension and renally adjust medications -Repeat CMP in a.m.  Diabetes type 2 with Hyperglycemia  -Continued current regimen of moderate NovoLog/scale insulin every 4 hours and may need to adjust further -CBGs ranging from 170-222 -Will resume Lantus 12 unis sq qHS and continue monitor  Dysphagia.  - SLP following. Deferred PEG at patient's request and now will consult IR given his continued failed swallowing.  - Continue tube feeds via Cortrak -Swallowing rehabilitation prior to PEG tube is currently been unsuccessful.  SLP recommending to try a cuffless trach and we have consulted pulmonary for this and they recommend doing that however they feel that it may not make much of a difference given that this is neurologically mediated next-the SLP wants to do MBS and likely we can do this in the a.m. but in the interim we have also consulted interventional radiology for PEG tube placement if he continues to fail -Currently has a Flexical from Diarrhea from TF and will discontinue slowdown -We will repeat SLP evaluation in a.m. as he failed again today  Normocytic Anemia -Patient's hemoglobin/hematocrit is stable at 8.9/27.2 yesterday and today is 9.2/20.1 -Check anemia panel in the a.m.  -Continue to monitor for signs and symptoms of bleeding; currently no overt bleeding noted Repeat CBC in a.m. -We will resume VTE  prophylaxis as well as his home clopidogrel  Leukocytosis -Likely reactive but he did spike a temperature and is on IV Ceftriaxone  -The patient's WBC went from 10.0 is now 14.9 and is now 14.2 -> 9.9 -> 9.0 -Continue to monitor and trend and since he spiked a temperature we will panculture -Repeat CBC in a.m.  Thrombocytosis -Likely reactive -Continue to monitor and trend -Patient's platelet count has gone from 460 -> 529 -> 478 -> 454 -> 447  Hx of Left MCA stroke with residual aphasia and right hemiparesis  -Continue with  aspirin 81 mg p.o. daily, atorvastatin 80 mg p.o. nightly, and clopidogrel 75 mg via Cortrak -We will obtain PT OT to further evaluate and treat and he will need SNF  Right arm swelling  -The setting of dependent edema but also has diagnosed SVT in the cephalic and basilic veins; Repeat Duplex continues to show SVTs -Likely the cause of his swelling is his SVTs -Continue with OT  Abnormal LFT's, not worsening -AST has worsened and gone from 37 -> 41 -> 80 -> 80 -> 83 -ALT has gone from 45 -> 46 -> 74 -> 85 -> 92 -RUQ U/s done and showed "Unremarkable right upper quadrant. Small right pleural effusion." -Continue monitor and trend daily until improving  DVT prophylaxis: Enoxaparin 40 mg sq q24h Code Status: FULL CODE  Family Communication: No family present at bedside  Disposition Plan: SNF vs CIR  Consultants:   PCCM Transfer  Neurology  Procedures:  8/8 ETT 8/14 ETT 08/19 Tracheostomy  8/23 PEG tube  Significant Diagnostic Tests:  8/8 CT head>>No acute intracranial abnormality. Large area of encephalomalacia throughout the left MCA territory, consistent with remote infarct. 8/8 CXR>>lungs clear 8/8 UDS>>+ benzo (but given versed in ED); o/w negative 8/8 MRI of brain>>large territory chronic hemorrhagic infarct in the left frontal lobe.Ill-defined hyperintensity throughout the left basal ganglia and left thalamus with mild enhancement in dobutamine likely secondary to encephalitis, infiltrating tumor or atypical subacute infarction 8/8 MRI of face and trigeminal region>>rules out soft tissue abscess 08/17 CXR>>improved aeration to the right lung base  8/17 CT Abdomen wo Contrast >>cholelithiasis/trace ascites/aorthic atherosclerosis   Micro Data:  8/8 Sars-CoV-2>>negative 8/8 BCx2>> no growth 8/8 UC>>Klebsiella pneumonia 08/14 BCx>> negative 08/14 Trach Cx>> gram positive rods and cocci in pairs 08/17 Trach Cx>> negative    Antimicrobials:    Anti-infectives (From admission, onward)   Start     Dose/Rate Route Frequency Ordered Stop   10/30/19 1400  cefTRIAXone (ROCEPHIN) 2 g in sodium chloride 0.9 % 100 mL IVPB        2 g 200 mL/hr over 30 Minutes Intravenous Every 24 hours 10/30/19 1251     10/18/19 2300  vancomycin (VANCOREADY) IVPB 750 mg/150 mL  Status:  Discontinued        750 mg 150 mL/hr over 60 Minutes Intravenous Every 24 hours 10/18/19 1359 10/19/19 1217   10/18/19 0200  vancomycin (VANCOREADY) IVPB 500 mg/100 mL  Status:  Discontinued        500 mg 100 mL/hr over 60 Minutes Intravenous Every 12 hours 10/17/19 1242 10/18/19 1359   10/17/19 1245  vancomycin (VANCOCIN) IVPB 1000 mg/200 mL premix        1,000 mg 200 mL/hr over 60 Minutes Intravenous  Once 10/17/19 1242 10/17/19 1441   10/17/19 1245  piperacillin-tazobactam (ZOSYN) IVPB 3.375 g        3.375 g 12.5 mL/hr over 240 Minutes Intravenous Every 8 hours 10/17/19 1242  10/24/19 2359   10/17/19 0900  ampicillin-sulbactam (UNASYN) 1.5 g in sodium chloride 0.9 % 100 mL IVPB  Status:  Discontinued        1.5 g 200 mL/hr over 30 Minutes Intravenous Every 6 hours 10/17/19 0809 10/17/19 0850   10/17/19 0900  Ampicillin-Sulbactam (UNASYN) 3 g in sodium chloride 0.9 % 100 mL IVPB  Status:  Discontinued        3 g 200 mL/hr over 30 Minutes Intravenous Every 6 hours 10/17/19 0850 10/17/19 1242   10/14/19 0600  vancomycin (VANCOREADY) IVPB 500 mg/100 mL  Status:  Discontinued        500 mg 100 mL/hr over 60 Minutes Intravenous Every 12 hours 10/14/19 0057 10/16/19 1534   10/12/19 1600  ampicillin (OMNIPEN) 2 g in sodium chloride 0.9 % 100 mL IVPB  Status:  Discontinued        2 g 300 mL/hr over 20 Minutes Intravenous Every 6 hours 10/12/19 1400 10/14/19 0934   10/12/19 0000  vancomycin (VANCOREADY) IVPB 750 mg/150 mL  Status:  Discontinued        750 mg 150 mL/hr over 60 Minutes Intravenous Every 12 hours 10/11/19 1254 10/14/19 0046   10/11/19 1230  cefTRIAXone  (ROCEPHIN) 2 g in sodium chloride 0.9 % 100 mL IVPB  Status:  Discontinued        2 g 200 mL/hr over 30 Minutes Intravenous Every 12 hours 10/11/19 1133 10/16/19 1534   10/11/19 1230  ampicillin (OMNIPEN) 2 g in sodium chloride 0.9 % 100 mL IVPB  Status:  Discontinued        2 g 300 mL/hr over 20 Minutes Intravenous Every 4 hours 10/11/19 1133 10/12/19 1400   10/11/19 1200  vancomycin (VANCOREADY) IVPB 1500 mg/300 mL        1,500 mg 150 mL/hr over 120 Minutes Intravenous  Once 10/11/19 1135 10/11/19 1419   10/11/19 1145  vancomycin (VANCOCIN) IVPB 1000 mg/200 mL premix  Status:  Discontinued        1,000 mg 200 mL/hr over 60 Minutes Intravenous  Once 10/11/19 1133 10/11/19 1135      Subjective: Seen and examined at bedside and continues to be aphasic.  Had no complaints but was working with therapy.  Failed again with a swallow test.  LFTs are still elevated but stable.  We will have pulmonary reevaluate about a cuffless trach and have him continue to follow.  PT OT recommending CIR now.  Speech therapy thinks that having a cuffless trach may improve his swallowing and they will do an MBS.  In the interim interventional radiology is consulted.  Patient is agreeable with the current plan.  No other concerns or complaints at this time but does complain of some abdominal pain occasionally.  Objective: Vitals:   11/02/19 1206 11/02/19 1518 11/02/19 1545 11/02/19 2011  BP: (!) 156/72 (!) 132/96  (!) 143/70  Pulse: 80 84 81 84  Resp: 20 20 (!) 22 20  Temp: 98.7 F (37.1 C) 97.9 F (36.6 C)  98.2 F (36.8 C)  TempSrc: Oral Oral  Axillary  SpO2:  99% 98% 92%  Weight:      Height:        Intake/Output Summary (Last 24 hours) at 11/02/2019 2022 Last data filed at 11/02/2019 1600 Gross per 24 hour  Intake 2544.83 ml  Output 2345 ml  Net 199.83 ml   Filed Weights   10/31/19 0420 11/01/19 0342 11/02/19 0414  Weight: 76.5 kg 76.7 kg 75.6  kg   Examination: Physical  Exam:  Constitutional: The patient is a thin chronically ill appearing Caucasian male in NAD and appears calm and comfortable Eyes: Lids and conjunctivae normal, sclerae anicteric  ENMT: External Ears, Nose appear normal. Grossly normal hearing. Cortrak in place Neck: Appears normal, supple, no cervical masses, normal ROM, no appreciable thyromegaly; no JVD Respiratory: Diminished to auscultation bilaterally with coarse breath sounds and some crackles, no wheezing, rales, rhonchi or crackles. Normal respiratory effort and patient is not tachypenic. No accessory muscle use. Unlabored breathing but wearing ATC Cardiovascular: RRR, no murmurs / rubs / gallops. S1 and S2 auscultated. No extremity edema. 2+ pedal pulses. No carotid bruits.  Abdomen: Soft, non-tender, non-distended. Bowel sounds positive x4.  GU: Deferred. Flexiseal and Urinary Catheter in place. Musculoskeletal: No clubbing / cyanosis of digits/nails. No joint deformity upper and lower extremities. Skin: No rashes, lesions, ulcers on a limited skin evaluation. No induration; Warm and dry.  Neurologic: He is hemiplegic and aphasic and does shake his head yes and no Psychiatric: Normal judgment and insight. Alert and oriented x 3. Normal mood and appropriate affect.   Data Reviewed: I have personally reviewed following labs and imaging studies  CBC: Recent Labs  Lab 10/29/19 0924 10/30/19 0429 10/31/19 0147 11/01/19 0927 11/02/19 0129  WBC 14.9* 14.5* 14.2* 9.9 9.0  NEUTROABS 12.7* 12.2* 11.7* 7.8* 6.1  HGB 9.2* 8.7* 8.7* 8.9* 9.2*  HCT 27.7* 26.7* 25.9* 27.2* 28.1*  MCV 91.7 92.7 91.8 91.0 91.2  PLT 529* 478* 454* 447* 165*   Basic Metabolic Panel: Recent Labs  Lab 10/28/19 0812 10/29/19 0456 10/29/19 0924 10/30/19 0429 10/31/19 0147 11/01/19 0927 11/02/19 0129  NA  --  138  --  136 137 135 137  K  --  3.9  --  3.6 3.6 3.8 3.5  CL  --  102  --  102 99 98 98  CO2  --  26  --  '26 26 26 27  ' GLUCOSE  --  229*  --   209* 222* 263* 168*  BUN  --  16  --  20 19 22* 22*  CREATININE  --  1.13  --  1.15 1.26* 1.23 1.20  CALCIUM  --  8.5*  --  8.7* 8.7* 8.6* 8.8*  MG   < >  --  1.8 1.8 1.8 1.8 1.9  PHOS  --   --  3.5 3.7 4.1 3.7 4.2   < > = values in this interval not displayed.   GFR: Estimated Creatinine Clearance: 70.9 mL/min (by C-G formula based on SCr of 1.2 mg/dL). Liver Function Tests: Recent Labs  Lab 10/29/19 0456 10/30/19 0429 10/31/19 0147 11/01/19 0927 11/02/19 0129  AST 37 41 80* 80* 83*  ALT 45* 46* 74* 85* 92*  ALKPHOS 137* 147* 148* 154* 177*  BILITOT 0.3 0.3 0.3 0.3 0.3  PROT 6.5 6.8 6.6 7.0 7.1  ALBUMIN 2.3* 2.4* 2.2* 2.3* 2.4*   No results for input(s): LIPASE, AMYLASE in the last 168 hours. No results for input(s): AMMONIA in the last 168 hours. Coagulation Profile: No results for input(s): INR, PROTIME in the last 168 hours. Cardiac Enzymes: No results for input(s): CKTOTAL, CKMB, CKMBINDEX, TROPONINI in the last 168 hours. BNP (last 3 results) No results for input(s): PROBNP in the last 8760 hours. HbA1C: No results for input(s): HGBA1C in the last 72 hours. CBG: Recent Labs  Lab 11/02/19 0407 11/02/19 0801 11/02/19 1204 11/02/19 1522 11/02/19 1945  GLUCAP 177*  173* 170* 209* 222*   Lipid Profile: No results for input(s): CHOL, HDL, LDLCALC, TRIG, CHOLHDL, LDLDIRECT in the last 72 hours. Thyroid Function Tests: No results for input(s): TSH, T4TOTAL, FREET4, T3FREE, THYROIDAB in the last 72 hours. Anemia Panel: No results for input(s): VITAMINB12, FOLATE, FERRITIN, TIBC, IRON, RETICCTPCT in the last 72 hours. Sepsis Labs: Recent Labs  Lab 10/30/19 1343 10/30/19 1645 10/31/19 0147 11/01/19 0128  PROCALCITON <0.10  --  <0.10 <0.10  LATICACIDVEN 1.2 1.2  --   --     Recent Results (from the past 240 hour(s))  Culture, blood (routine x 2)     Status: None (Preliminary result)   Collection Time: 10/30/19  1:43 PM   Specimen: BLOOD  Result Value Ref  Range Status   Specimen Description BLOOD LEFT ANTECUBITAL  Final   Special Requests   Final    BOTTLES DRAWN AEROBIC ONLY Blood Culture results may not be optimal due to an inadequate volume of blood received in culture bottles   Culture   Final    NO GROWTH 3 DAYS Performed at [facility], [place] [address] [place], [place] [address]    Report Status PENDING  Incomplete  Culture, blood (routine x 2)     Status: None (Preliminary result)   Collection Time: 10/30/19  1:43 PM   Specimen: BLOOD  Result Value Ref Range Status   Specimen Description BLOOD BLOOD LEFT HAND  Final   Special Requests   Final    BOTTLES DRAWN AEROBIC AND ANAEROBIC Blood Culture adequate volume   Culture   Final    NO GROWTH 3 DAYS Performed at [facility], [place] [address], [place], [place] [address]    Report Status PENDING  Incomplete     RN Pressure Injury Documentation:     Estimated body mass index is 21.99 kg/m as calculated from the following:   Height as of this encounter: '6\' 1"'  (1.854 m).   Weight as of this encounter: 75.6 kg.  Malnutrition Type:  Nutrition Problem: Inadequate oral intake Etiology: inability to eat   Malnutrition Characteristics:  Signs/Symptoms: NPO status   Nutrition Interventions:  Interventions: Tube feeding    Radiology Studies: DG CHEST PORT 1 VIEW  Result Date: 11/02/2019 CLINICAL DATA:  Dyspnea. EXAM: PORTABLE CHEST 1 VIEW COMPARISON:  10/31/2019 FINDINGS: Tracheostomy tube and feeding tube remain in place. Heart size remains within normal limits. Pulmonary hyperinflation is again seen. Decreased airspace disease is seen in both lung bases. IMPRESSION: Decreased bibasilar airspace disease. Electronically Signed   By: Marlaine Hind M.D.   On: 11/02/2019 08:09     Scheduled Meds:  amLODipine  10 mg Per Tube Daily   aspirin  81 mg Per Tube Daily   atorvastatin  80 mg Per Tube Daily   bethanechol  15 mg Per Tube TID   chlorhexidine  gluconate (MEDLINE KIT)  15 mL Mouth Rinse BID   Chlorhexidine Gluconate Cloth  6 each Topical Daily   clopidogrel  75 mg Per Tube Daily   enoxaparin (LOVENOX) injection  40 mg Subcutaneous Q24H   feeding supplement (PROSource TF)  45 mL Per Tube BID   Gerhardt's butt cream   Topical Daily   insulin aspart  0-15 Units Subcutaneous Q4H   insulin glargine  12 Units Subcutaneous QHS   lacosamide  150 mg Per Tube BID   levETIRAcetam  1,000 mg Per Tube BID   mouth rinse  15 mL Mouth Rinse q12n4p  phenytoin  150 mg Per Tube TID   QUEtiapine  50 mg Per Tube QHS   sennosides  5 mL Per Tube BID   Continuous Infusions:  sodium chloride Stopped (10/15/19 1834)   cefTRIAXone (ROCEPHIN)  IV 2 g (11/02/19 1400)   feeding supplement (OSMOLITE 1.5 CAL) 55 mL/hr at 11/02/19 1000    LOS: 22 days   Kerney Elbe, DO [facility] PAGER is on AMION  If 7PM-7AM, please contact night-coverage [email or web address]

## 2019-11-02 NOTE — Progress Notes (Signed)
MEDICATION RELATED CONSULT NOTE - FOLLOW UP   Pharmacy Consult for Phenytoin Indication: Seizures   No Known Allergies  Patient Measurements: Height: 6\' 1"  (185.4 cm) Weight: 75.6 kg (166 lb 10.7 oz) IBW/kg (Calculated) : 79.9   Vital Signs: Temp: 98.1 F (36.7 C) (08/30 0408) Temp Source: Oral (08/30 0408) BP: 155/74 (08/30 0408) Pulse Rate: 81 (08/30 0408) Intake/Output from previous day: 08/29 0701 - 08/30 0700 In: 3042.9 [NG/GT:2842.9; IV Piggyback:200] Out: 3105 [Urine:2700; Stool:405] Intake/Output from this shift: No intake/output data recorded.  Labs: Recent Labs    10/31/19 0147 11/01/19 0927 11/02/19 0129  WBC 14.2* 9.9 9.0  HGB 8.7* 8.9* 9.2*  HCT 25.9* 27.2* 28.1*  PLT 454* 447* 473*  CREATININE 1.26* 1.23 1.20  MG 1.8 1.8 1.9  PHOS 4.1 3.7 4.2  ALBUMIN 2.2* 2.3* 2.4*  PROT 6.6 7.0 7.1  AST 80* 80* 83*  ALT 74* 85* 92*  ALKPHOS 148* 154* 177*  BILITOT 0.3 0.3 0.3   Estimated Creatinine Clearance: 70.9 mL/min (by C-G formula based on SCr of 1.2 mg/dL).    Assessment:  Last seizure noted 8/14  - 8/7 loaded with DPH (goal 15-20) and Keppra - 8/9 Keppra dose reduced d/t renal fxn 750mg  IV q12h > 8/13 incr back to 1000mg  q12h  8/7 DPH 1500mg  IV x1 then 100mg  Q8H (~4 mg/kg/d based on TBW; IBW 80 kg) 8/8 DPH level (post-load): 20.1 >> no adjustments, cont current dosing 8/11 DPH 14.1, albumin 4.1 8/12 DPH 13.4.  Vimpat and Onfi added. 8/13 DPH 12.6, Albumin 2.5, corrected 21. Vimpat and Keppra increased d/t improved CrCl 8/15 DPH 7.5 > corrected = 12.5, free level ~0.95 >> 150mg  x1 then resume 100 Q8hr, renal fx worsening 8/17 DPH 4.3 (Corrects to 9.35 based on albumin from 8/18) - continue current dose per MD  8/18: Phenytoin level 3 adjusts to 8.4 8/30: Phenytoin level 3.5 adjusted to 6 for albumin and renal function (goal 15-20)  Goal of Therapy:  Phenytoin level 15-20 Seizure control  Plan:  -Will increase phenytoin to 150mg  per tube  TID to maintain goal range of 15-20 per consult parameters.   -Next level at steady state after dose change.    Starkeisha Vanwinkle A. 9/15, PharmD, BCPS, FNKF Clinical Pharmacist [place] Please utilize Amion for appropriate phone number to reach the unit pharmacist [facility])

## 2019-11-02 NOTE — Procedures (Signed)
Tracheostomy Change Note  Patient Details:   Name: Corey Bennett DOB: 05/02/1960 MRN: [id number]    Airway Documentation:     Evaluation  O2 sats: stable throughout Complications: No apparent complications Patient did tolerate procedure well. Bilateral Breath Sounds: Clear, Diminished   Pt changed to a #6 [place] Flexible cuffless trach per CCM order. No apparent complications noted, pt tolerated well. Confirmed placement with ECO2 detector color change and bilateral breath sounds. RT will continue to monitor.     Karesa Maultsby A Cherlyn Syring 11/02/2019, 4:08 PM

## 2019-11-03 ENCOUNTER — Encounter: Payer: BC Managed Care – PPO | Admitting: Occupational Therapy

## 2019-11-03 ENCOUNTER — Inpatient Hospital Stay (HOSPITAL_COMMUNITY): Payer: BC Managed Care – PPO

## 2019-11-03 LAB — CBC WITH DIFFERENTIAL/PLATELET
Abs Immature Granulocytes: 0.05 10*3/uL (ref 0.00–0.07)
Basophils Absolute: 0.1 10*3/uL (ref 0.0–0.1)
Basophils Relative: 1 %
Eosinophils Absolute: 0.5 10*3/uL (ref 0.0–0.5)
Eosinophils Relative: 6 %
HCT: 29.4 % — ABNORMAL LOW (ref 39.0–52.0)
Hemoglobin: 9.3 g/dL — ABNORMAL LOW (ref 13.0–17.0)
Immature Granulocytes: 1 %
Lymphocytes Relative: 17 %
Lymphs Abs: 1.4 10*3/uL (ref 0.7–4.0)
MCH: 29.3 pg (ref 26.0–34.0)
MCHC: 31.6 g/dL (ref 30.0–36.0)
MCV: 92.7 fL (ref 80.0–100.0)
Monocytes Absolute: 0.7 10*3/uL (ref 0.1–1.0)
Monocytes Relative: 9 %
Neutro Abs: 5.7 10*3/uL (ref 1.7–7.7)
Neutrophils Relative %: 66 %
Platelets: 476 10*3/uL — ABNORMAL HIGH (ref 150–400)
RBC: 3.17 MIL/uL — ABNORMAL LOW (ref 4.22–5.81)
RDW: 13.9 % (ref 11.5–15.5)
WBC: 8.5 10*3/uL (ref 4.0–10.5)
nRBC: 0 % (ref 0.0–0.2)

## 2019-11-03 LAB — COMPREHENSIVE METABOLIC PANEL
ALT: 90 U/L — ABNORMAL HIGH (ref 0–44)
AST: 82 U/L — ABNORMAL HIGH (ref 15–41)
Albumin: 2.4 g/dL — ABNORMAL LOW (ref 3.5–5.0)
Alkaline Phosphatase: 165 U/L — ABNORMAL HIGH (ref 38–126)
Anion gap: 14 (ref 5–15)
BUN: 25 mg/dL — ABNORMAL HIGH (ref 6–20)
CO2: 23 mmol/L (ref 22–32)
Calcium: 8.6 mg/dL — ABNORMAL LOW (ref 8.9–10.3)
Chloride: 99 mmol/L (ref 98–111)
Creatinine, Ser: 1.22 mg/dL (ref 0.61–1.24)
GFR calc Af Amer: >60 mL/min (ref >60)
GFR calc non Af Amer: >60 mL/min (ref >60)
Glucose, Bld: 237 mg/dL — ABNORMAL HIGH (ref 70–99)
Potassium: 4.2 mmol/L (ref 3.5–5.1)
Sodium: 136 mmol/L (ref 135–145)
Total Bilirubin: 0.5 mg/dL (ref 0.3–1.2)
Total Protein: 7.1 g/dL (ref 6.5–8.1)

## 2019-11-03 LAB — GLUCOSE, CAPILLARY
Glucose-Capillary: 177 mg/dL — ABNORMAL HIGH (ref 70–99)
Glucose-Capillary: 182 mg/dL — ABNORMAL HIGH (ref 70–99)
Glucose-Capillary: 188 mg/dL — ABNORMAL HIGH (ref 70–99)
Glucose-Capillary: 191 mg/dL — ABNORMAL HIGH (ref 70–99)
Glucose-Capillary: 225 mg/dL — ABNORMAL HIGH (ref 70–99)
Glucose-Capillary: 245 mg/dL — ABNORMAL HIGH (ref 70–99)
Glucose-Capillary: 268 mg/dL — ABNORMAL HIGH (ref 70–99)

## 2019-11-03 LAB — URINE CULTURE: Culture: NO GROWTH

## 2019-11-03 LAB — MAGNESIUM: Magnesium: 2.1 mg/dL (ref 1.7–2.4)

## 2019-11-03 LAB — PHOSPHORUS: Phosphorus: 4.1 mg/dL (ref 2.5–4.6)

## 2019-11-03 IMAGING — DX DG CHEST 1V PORT
1 series · 1 of 1 positions shown · non-contrast
Comparison: [DATE].

CLINICAL DATA: Shortness of breath.

EXAM:
PORTABLE CHEST 1 VIEW

[chest]
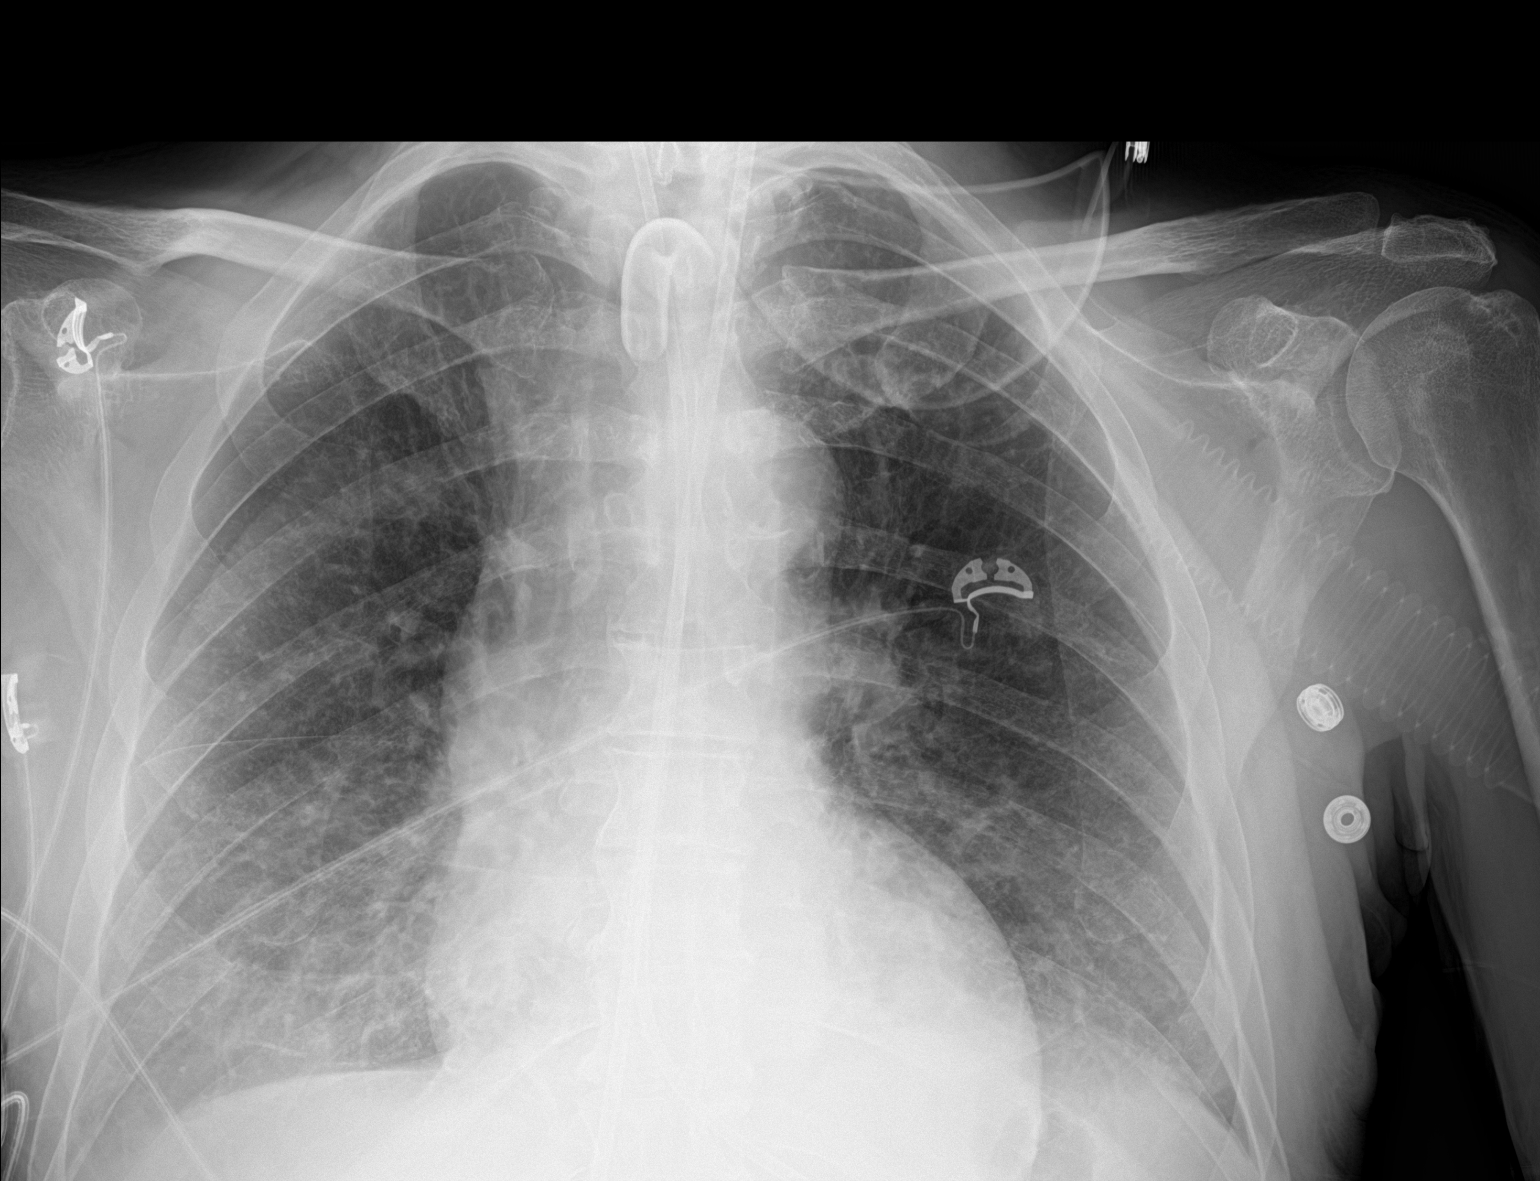

[1 of 1 positions shown; findings below may reference images not displayed]

FINDINGS: Tracheostomy tube, feeding tube in stable position. Stable
cardiomegaly. Bilateral pulmonary infiltrates/edema again noted
without interim change. Right costophrenic angle incompletely
imaged. No prominent pleural effusion. No pneumothorax
IMPRESSION: 1.  Lines and tubes in stable position.

2. Bilateral pulmonary infiltrates/edema again noted without interim
change.

## 2019-11-03 MED ORDER — INSULIN ASPART 100 UNIT/ML ~~LOC~~ SOLN
2.0000 [IU] | SUBCUTANEOUS | Status: DC
  Administered 2019-11-03 – 2019-11-21 (×96): 2 [IU] via SUBCUTANEOUS

## 2019-11-03 MED ORDER — FUROSEMIDE 10 MG/ML IJ SOLN
40.0000 mg | Freq: Once | INTRAMUSCULAR | Status: AC
  Administered 2019-11-03: 40 mg via INTRAVENOUS
  Filled 2019-11-03: qty 4

## 2019-11-03 NOTE — PMR Pre-admission (Addendum)
PMR Admission Coordinator Pre-Admission Assessment  Patient: Corey Bennett is an 59 y.o., male MRN: [id number] DOB: [birth year] Height: '6\' 1"'  (185.4 cm) Weight: 73 kg              Insurance Information HMO:     Bennett: yes     PCP:      IPA:      80/20:      OTHER:  PRIMARY: Corey Bennett     Policy#: [id number]      Subscriber: Patient CM Name: Corey Bennett       Phone#:      Fax#: [phone] Clinical review/updates due 1/75/1025 Pre-Cert#: [id number] Phone: [phone]      Employer:   Benefits:  Phone #: [phone]   Name: Corey Bennett. Date:  03/05/2017 - still active    Deduct: $100 ($100 met)      Out of Pocket Max: OOP Max: $4,000 ($2,658 met)      Life Max: n/a  CIR: $150/day co-pay for days 1-5, $0/day co-pay for days 6+    SNF: 80% coverage, 20% co-insurance; with a limit of 60 days/cal yr Outpatient: 80% coverage, 20% co-insurance; limited to 30 visits combined PT/OT rehabilitative visits Home Health: 100% coverage, 0% co-insurance; limited by medical necessity DME:  80% coverage, 20% co-insurance Providers: In network   SECONDARY:  N/a   The "Data Collection Information Summary" for patients in Inpatient Rehabilitation Facilities with attached "Privacy Act [place] Records" was provided and verbally reviewed with: N/A  Emergency Contact Information Contact Information    Name Relation Home Work Mobile   Corey Bennett, Corey Bennett Brother   Corey Bennett Mother   [phone]   Corey Bennett, Corey Bennett Brother   [phone]     Current Medical History  Patient Admitting Diagnosis: CVA   History of Present Illness: Corey Bennett is a 59 year old male with past medical history of 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 Patient is living in a assisted living facility and can ambulate by himself. Patient was found by staff to have a right sided  twitching with right eye deviation. He was given Versed and Dilantin and was intubated for airway protection. CT/MRI showed large territory chronic hemorrhagic infarct in the left frontal lobe.  Ill-defined hyperintensities throughout the left basal ganglia and thalamus.  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  Patient remained on aspirin Plavix for CVA prophylaxisPatient was extubated on 8/11 and his seizures was controlled with Keppra, phenytoin, and Vimpat.He was transferred to 3 W. for medical management On 8/14,patient continued to have seizure with twitching of the right upper extremity and 2 mg of lorazepam was given. Patient continued to desat in the 80s on 15 L of nonrebreather mask with altered mental status. Patient was re-intubated to protect airway and transferred to the ICU. On 10/22/2019 he had a regular tracheostomy, has a cuffed shiley #6. Pt. Tolerating PMV on 5 L via trach collar.   Pt.  has had multiple MBS and clinical swallowing evaluations and has been recommended to remain NPO with occasional sips of nectar thick liquid. PEG placed on 9/7  per Corey Bennett of interventional radiology.  Patient bouts of urinary retention maintained on Urecholine.  Therapy evaluations completed and patient was admitted for a comprehensive rehab program.   Complete NIHSS  TOTAL: 12 Glasgow Coma Scale Score: 15  Past Medical History  Past Medical History:  Diagnosis Date  . Carotid artery occlusion   . Diabetes ([place])   . Hypercholesterolemia   . Hypertension   . 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 Stroke [facility])     Family History  family history is not on file.  Prior Rehab/Hospitalizations:  Has the patient had prior rehab or hospitalizations prior to admission? Yes  Has the patient had major surgery during 100 days prior to admission?  Yes  Current Medications   Current Facility-Administered Medications:  .  0.9 %  sodium chloride infusion, , Intravenous, PRN, Kipp Brood, MD, Stopped at 11/14/19 0053 .  acetaminophen (TYLENOL) 160 MG/5ML solution 650 mg, 650 mg, Per Tube, Q6H PRN, Kipp Brood, MD, 650 mg at 11/19/19 1623 .  amLODipine (NORVASC) tablet 10 mg, 10 mg, Per Tube, Daily, Agarwala, Ravi, MD, 10 mg at 11/20/19 0921 .  aspirin chewable tablet 81 mg, 81 mg, Per Tube, Daily, Elodia Florence., MD, 81 mg at 11/20/19 0921 .  atorvastatin (LIPITOR) tablet 80 mg, 80 mg, Per Tube, Daily, Agarwala, Ravi, MD, 80 mg at 11/20/19 0922 .  bethanechol (URECHOLINE) tablet 15 mg, 15 mg, Per Tube, TID, Kipp Brood, MD, 15 mg at 11/20/19 0921 .  chlorhexidine gluconate (MEDLINE KIT) (PERIDEX) 0.12 % solution 15 mL, 15 mL, Mouth Rinse, BID, Agarwala, Ravi, MD, 15 mL at 11/19/19 1000 .  Chlorhexidine Gluconate Cloth 2 % PADS 6 each, 6 each, Topical, Daily, Elodia Florence., MD, 6 each at 11/19/19 1105 .  clopidogrel (PLAVIX) tablet 75 mg, 75 mg, Per Tube, Daily, Elodia Florence., MD, 75 mg at 11/20/19 4782 .  docusate (COLACE) 50 MG/5ML liquid 100 mg, 100 mg, Per Tube, BID PRN, Amin, Ankit Chirag, MD, 100 mg at 11/16/19 0932 .  doxazosin (CARDURA) tablet 1 mg, 1 mg, Per Tube, Daily, Elodia Florence., MD, 1 mg at 11/20/19 1132 .  famotidine (PEPCID) tablet 10 mg, 10 mg, Per Tube, Daily, Donne Hazel, MD, 10 mg at 11/20/19 9562 .  feeding supplement (OSMOLITE 1.5 CAL) liquid 1,000 mL, 1,000 mL, Per Tube, Continuous, Elodia Florence., MD, Last Rate: 55 mL/hr at 11/20/19 0507, 1,000 mL at 11/20/19 0507 .  feeding supplement (PROSource TF) liquid 45 mL, 45 mL, Per Tube, BID, Elodia Florence., MD, 45 mL at 11/20/19 0923 .  Gerhardt's butt cream, , Topical, Daily, Kipp Brood, MD, Given at 11/20/19 1045 .  guaiFENesin (ROBITUSSIN) 100 MG/5ML solution 100 mg, 5 mL, Per Tube, Q4H PRN, Dimple Nanas, RPH, 100 mg at 11/18/19 2114 .  hydrALAZINE (APRESOLINE) tablet 50 mg, 50 mg, Per Tube, Q8H, Donne Hazel, MD, 50 mg at 11/20/19 0505 .  insulin aspart (novoLOG) injection 0-9 Units, 0-9 Units, Subcutaneous, Q4H, Donne Hazel, MD, 2 Units at 11/20/19 [phone] .  insulin aspart (novoLOG) injection 2 Units, 2 Units, Subcutaneous, Q4H, Raiford Noble Rhodell, [place], 2 Units at 11/20/19 6578 .  insulin glargine (LANTUS) injection 6 Units, 6 Units, Subcutaneous, QHS, Donne Hazel, MD, 6 Units at 11/19/19 2114 .  iohexol (OMNIPAQUE) 300 MG/ML solution 50 mL, 50 mL, Per Tube, Once PRN, Corrie Mckusick, DO .  ipratropium-albuterol (DUONEB) 0.5-2.5 (3) MG/3ML nebulizer solution 3 mL, 3 mL, Nebulization, Q6H PRN, Agarwala, Ravi, MD, 3 mL at 11/09/19 1211 .  labetalol (NORMODYNE) injection 5 mg, 5 mg, Intravenous, Q2H PRN, Agarwala,  Einar Grad, MD, 5 mg at 10/13/19 0304 .  lacosamide (VIMPAT) oral solution 150 mg, 150 mg, Per Tube, BID, Elodia Florence., MD, 150 mg at 11/20/19 0923 .  levETIRAcetam (KEPPRA) 100 MG/ML solution 1,000 mg, 1,000 mg, Per Tube, BID, Elodia Florence., MD, 1,000 mg at 11/20/19 7048 .  LORazepam (ATIVAN) injection 2 mg, 2 mg, Intravenous, PRN, Kipp Brood, MD, 2 mg at 10/17/19 0948 .  MEDLINE mouth rinse, 15 mL, Mouth Rinse, q12n4p, Agarwala, Ravi, MD, 15 mL at 11/19/19 1807 .  ondansetron (ZOFRAN) injection 4 mg, 4 mg, Intravenous, Q6H PRN, Elodia Florence., MD, 4 mg at 11/11/19 1124 .  phenytoin (DILANTIN) 125 MG/5ML suspension 175 mg, 175 mg, Per Tube, TID, Elodia Florence., MD, 175 mg at 11/20/19 1132 .  polyethylene glycol (MIRALAX / GLYCOLAX) packet 17 g, 17 g, Per Tube, Daily PRN, Kipp Brood, MD, 17 g at 11/14/19 0858 .  QUEtiapine (SEROQUEL) tablet 50 mg, 50 mg, Per Tube, QHS, Agarwala, Ravi, MD, 50 mg at 11/19/19 2111 .  scopolamine (TRANSDERM-SCOP) 1 MG/3DAYS 1.5 mg, 1 patch, Transdermal, Q72H, Zierle-Ghosh, Asia B, DO, 1.5 mg at 11/18/19  2113 .  sennosides (SENOKOT) 8.8 MG/5ML syrup 5 mL, 5 mL, Per Tube, BID, Agarwala, Ravi, MD, 5 mL at 11/20/19 1133  Patients Current Diet:  Diet Order            Diet NPO time specified  Diet effective midnight                 Precautions / Restrictions Precautions Precautions: Fall Precaution Comments: trach, PEG (abdominal binder) Restrictions Weight Bearing Restrictions: No   Has the patient had 2 or more falls or a fall with injury in the past year?Unknown  Prior Activity Level Household: Pt. was a resident in ALF  Prior Functional Level Prior Function Level of Independence: Needs assistance Gait / Transfers Assistance Needed: walking without device at ALF ADL's / Homemaking Assistance Needed: Pt indicates he required some assistance with bathing, dressing, and that he was able to perform toileting mod I.  He also indicates he was able to self feed, and his meals were delivered to his room.  No family present to confirm accuracy of info.  Communication / Swallowing Assistance Needed: expressive and receptive aphasias at baseline per chart review  Self Care: Did the patient need help bathing, dressing, using the toilet or eating?  Independent  Indoor Mobility: Did the patient need assistance with walking from room to room (with or without device)? Independent  Stairs: Did the patient need assistance with internal or external stairs (with or without device)? Independent  Functional Cognition: Did the patient need help planning regular tasks such as shopping or remembering to take medications? Dependent  Home Assistive Devices / Equipment Home Assistive Devices/Equipment: None  Prior Device Use: Indicate devices/aids used by the patient prior to current illness, exacerbation or injury? none  Current Functional Level Cognition  Arousal/Alertness: Awake/alert Overall Cognitive Status: Difficult to assess Difficult to assess due to: Tracheostomy Orientation Level:  Oriented to person Following Commands: Follows one step commands consistently Safety/Judgement: Decreased awareness of safety, Decreased awareness of deficits General Comments: pt follows one step commands consistently, reduced awareness of deficits (especially posterior LOB)    Extremity Assessment (includes Sensation/Coordination)  Upper Extremity Assessment: RUE deficits/detail RUE Deficits / Details: no activation of hand or wrist noted. pt with flexion pattern at rest and painful with full extension. resting hand splint modified to allow  neutral wrist instead of wrist extension. pt does not tolerate wrist extension RUE Sensation: decreased proprioception, decreased light touch RUE Coordination: decreased fine motor, decreased gross motor  Lower Extremity Assessment: Defer to PT evaluation RLE Deficits / Details: grossly 3/5, ROM WFL    ADLs  Overall ADL's : Needs assistance/impaired Eating/Feeding: NPO Eating/Feeding Details (indicate cue type and reason): requires assist for thoroughness when washing face  Grooming: Maximal assistance, Standing (total +2 mod (A) with posterior lean) Grooming Details (indicate cue type and reason): sitting in bed with HOB elevated, washing face with setup assist but mod assist to wash hands  Upper Body Bathing: Moderate assistance, Sitting Lower Body Bathing: Moderate assistance, Sit to/from stand Upper Body Dressing : Moderate assistance, Sitting Lower Body Dressing: Moderate assistance, Sit to/from stand Lower Body Dressing Details (indicate cue type and reason): Pt able to don socks with min A to get sock started over toes and for balance  Toilet Transfer: +2 for physical assistance, Moderate assistance Toilet Transfer Details (indicate cue type and reason): simualted Toileting- Clothing Manipulation and Hygiene: Total assistance, Sit to/from stand Toileting - Clothing Manipulation Details (indicate cue type and reason): flexi seal leaking and pt  was assisted with peri care in standing  Functional mobility during ADLs: Moderate assistance, +2 for physical assistance, +2 for safety/equipment General ADL Comments: pt reports "no to alot of questions    Mobility  Overal bed mobility: Needs Assistance Bed Mobility: Supine to Sit Rolling: Min assist Sidelying to sit: Mod assist Supine to sit: Supervision Sit to supine: Min guard General bed mobility comments: supervision for lines and safety; vc to complete task after stopping midway    Transfers  Overall transfer level: Needs assistance Equipment used: Rolling walker (2 wheeled) Transfer via Lift Equipment: Stedy Transfers: Sit to/from Stand Sit to Stand: Mod assist (due to posterior LOB) Stand pivot transfers: Mod assist, +2 physical assistance, +2 safety/equipment Squat pivot transfers: Mod assist General transfer comment: stood up quickly with posterior sway requiring up to mod assist to recover due (no righting reaction)    Ambulation / Gait / Stairs / Wheelchair Mobility  Ambulation/Gait Ambulation/Gait assistance: Mod assist, Min assist Gait Distance (Feet): 90 Feet Assistive device: 2 person hand held assist (Pt placed left hand/arm on PTs shoulder; OT on RUE for WB'g ) Gait Pattern/deviations: Decreased dorsiflexion - right, Decreased step length - right, Drifts right/left, Step-through pattern, Decreased weight shift to right, Scissoring, Narrow base of support General Gait Details: pt able to widen steps (step RLE more laterally) with verbal and visual cues (provided by SLP who arrived to see pt as ambulating); patient developed antalgic gait (favoring RLE) with rt knee maintained in Flexion  Gait velocity: reduced Gait velocity interpretation: <1.31 ft/sec, indicative of household ambulator    Posture / Balance Dynamic Sitting Balance Sitting balance - Comments: reliant on LUE support of bed Balance Overall balance assessment: Needs assistance Sitting-balance  support: No upper extremity supported, Feet supported Sitting balance-Leahy Scale: Good Sitting balance - Comments: reliant on LUE support of bed Standing balance support: No upper extremity supported Standing balance-Leahy Scale: Poor Standing balance comment: standing at sink and removed LUE support from counter with pt immediately with posterior lean/LOB    Special needs/care consideration Trach size #6 Shiley uncuffed with PMV,  Skin: abrasion to the medial chest  Diabetic management: pt. With DM2, managed with Lantus, Novolog Special service needs Pt. With trach, PEG     Previous Home Environment (from acute therapy documentation) Type  of Home: Assisted living Home Care Services: No Additional Comments: [facility].  Pt resided in [place] prior to stroke 06/2019  Discharge Living Setting Plans for Discharge Living Setting: House Type of Home at Discharge: House Discharge Home Layout: One level Discharge Home Access: Level entry Discharge Bathroom Shower/Tub: Walk-in shower Discharge Bathroom Toilet: Standard Discharge Bathroom Accessibility: Yes How Accessible: Accessible via walker Does the patient have any problems obtaining your medications?: No  Social/Family/Support Systems Patient Roles: Other (Comment) Contact Information: [phone] Anticipated Caregiver: Shreyan Hinz  Anticipated Caregiver's Contact Information: [phone] Ability/Limitations of Caregiver:  (Plan to pay caregivers to come in the home for care) Caregiver Availability: 24/7 Discharge Plan Discussed with Primary Caregiver: Yes Is Caregiver In Agreement with Plan?: Yes Does Caregiver/Family have Issues with Lodging/Transportation while Pt is in Rehab?: No   I spoke with pt.'s brother, Jori Moll, and the plan is not to return to [facility]. They plan to take patient back to his parent's home in [place], [place] and either pay for 24/7 nursing care or pay for a long term placement in a SNF. He  is aware that insurance would not cover SNF after CIR and he states that he is fine with paying out of pocket.   Goals Patient/Family Goal for Rehab: PT/OT Min A, SLP Mod A Expected length of stay: 14-18 days Pt/Family Agrees to Admission and willing to participate: Yes Program Orientation Provided & Reviewed with Pt/Caregiver Including Roles  & Responsibilities: Yes  Barriers to Discharge: Trach, Lack of/limited family support, Insurance for SNF coverage, Nutrition means   Decrease burden of Care through IP rehab admission: Specialzed equipment needs, Decannulation, Diet advancement, Decrease number of caregivers and Patient/family education   Possible need for SNF placement upon discharge:Not anticipated   Patient Condition: This patient's medical and functional status has changed since the consult dated: 11/02/2019 in which the Rehabilitation Physician determined and documented that the patient's condition is appropriate for intensive rehabilitative care in an inpatient rehabilitation facility. See "History of Present Illness" (above) for medical update. Functional changes are: Pt. Now mod A-Min A with PT/OT, walking 90 ft with mod-min A, tolerating small amounts of PO with SLP. Patient's medical and functional status update has been discussed with the Rehabilitation physician and patient remains appropriate for inpatient rehabilitation. Will plan  admit to inpatient rehab tomorrow when bed becomes available.  Preadmission Screen Completed By:  Genella Mech, CCC-SLP, 11/20/2019 12:18 PM ______________________________________________________________________   Discussed status with Dr. Naaman Plummer on 11/20/2019 at 80 and received approval for admission tomorrow, when bed becomes available .  Admission Coordinator:  Genella Mech, time 1221/Date 9/17//21

## 2019-11-03 NOTE — Progress Notes (Signed)
  Speech Language Pathology Treatment: [name] Speaking valve  Patient Details Name: Corey Bennett MRN: [id number] DOB: [birth year] Today's Date: 11/03/2019 Time: 6979-4801 SLP Time Calculation (min) (ACUTE ONLY): 22 min  Assessment / Plan / Recommendation Clinical Impression  Pt demonstrates improved airlow to upper airway with PMSV in place after trach change yesterday. Pts phonation is still severely dysphonic, cough is also dysphonic. However, with PMSV in place, sustained throughout MBS pt was able to articulate a word intelligibly, and also expectorate aspirate when cough was initiated. Pt did continue to have difficulty initiating oral motor movement, groping for words at times and demonstrating severe lingual weakness. After discussion with brother today, this is far from pts baseline after his last stroke. Pt could read words aloud intelligibly despite aphasia. He had no dysphagia. Pt has signs now of severe dysarthria and apraxia in addition to aphasia. Will discuss with MD.    HPI HPI: Pt is a 59 yo male admitted from ALF with status epilepticus in the setting of recent large L MCA stroke, requiring intubation 8/8-8/11. BSE 8/12 recommended NPO with concern for reduced secretion management and pt was subsequently reintubated 8/14. Corey Bennett 8/19. Pt was evaluated by OP SLP 7/26 (after completing CIR in [place] [facility] SLP), noting "receptive and expressive aphasia (likely severe) and some degree of verbal apraxia." He was awaiting a speech generating device. PMH: CVA with aphasia and hemiparesis, HTN, hypercholesterolemia, diabetes      SLP Plan  Continue with current plan of care       Recommendations         Patient may use Passy-Muir Speech Valve: During all therapies with supervision;Intermittently with supervision PMSV Supervision: Full         Follow up Recommendations: Inpatient Rehab SLP Visit Diagnosis: Dysphagia, oropharyngeal phase (R13.12);Aphonia (R49.1) Plan: Continue  with current plan of care       GO                Chanele Douglas, Riley Nearing 11/03/2019, 10:16 AM

## 2019-11-03 NOTE — Progress Notes (Signed)
Inpatient Rehab Admissions Coordinator:   I met with patient at bedside to discuss CIR admission. Pt. Was not able to answer questions beyond yes/no, but did appear to have a consistent yes/no response. Pt. Indicated interest and gave consent to contact his brother, Jori Moll, for further discussion. I called and left a message for Jori Moll and am awaiting call-back.   Clemens Catholic, [place], [place] Admissions Coordinator  [phone] ([place]) [phone] (office)

## 2019-11-03 NOTE — Progress Notes (Signed)
Inpatient Diabetes Program Recommendations  AACE/ADA: New Consensus Statement on Inpatient Glycemic Control (2015)  Target Ranges:  Prepandial:   less than 140 mg/dL      Peak postprandial:   less than 180 mg/dL (1-2 hours)      Critically ill patients:  140 - 180 mg/dL   Lab Results  Component Value Date   GLUCAP 268 (H) 11/03/2019   HGBA1C 7.2 (H) 09/04/2019    Review of Glycemic Control Results for Corey Bennett, [name] (MRN [id number]) as of 11/03/2019 12:52  Ref. Range 11/02/2019 19:45 11/02/2019 23:22 11/03/2019 03:13 11/03/2019 08:15 11/03/2019 11:26  Glucose-Capillary Latest Ref Range: 70 - 99 mg/dL 034 (H) 917 (H) 915 (H) 245 (H) 268 (H)   Diabetes history: DM 2 Outpatient Diabetes medications:  Lantus 12 units q HS, Humalog 5 units tid with meals (Hold if blood glucose<120 mg/dL), Metformin 0569 mg bid Current orders for Inpatient glycemic control:  Lantus 12 units daily, Novolog 0-15 units q 4 hours Osmolite 55 ml/hr  Inpatient Diabetes Program Recommendations:    May consider adding Novolog 2-3 units q 4 hours to cover tube feeds.   Thanks  Beryl Meager, RN, BC-ADM Inpatient Diabetes Coordinator Pager [phone] (8a-5p)

## 2019-11-03 NOTE — Progress Notes (Signed)
Nutrition Follow-up  DOCUMENTATION CODES:   Not applicable  INTERVENTION:  Continue tube feeding via cortrak tube: Osmolite 1.5 at 55 ml/h (1320 ml per day) Prosource TF 45 ml BID  Provides 2060 kcal, 105 gm protein, 1006 ml free water daily   NUTRITION DIAGNOSIS:   Inadequate oral intake related to inability to eat as evidenced by NPO status.  Ongoing  GOAL:   Patient will meet greater than or equal to 90% of their needs  Met with TF  MONITOR:   Vent status, Labs, Weight trends, TF tolerance, Skin, I & O's  REASON FOR ASSESSMENT:   Ventilator    ASSESSMENT:   59 y.o. 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  He was intubated for airway protection and status and PCCM consulted for admission  8/11- extubated 8/12- s/p BSE- recommend continue NPO 8/13- cortrak tube placed (tip of tube in stomach), TF initiated 8/14- re-intubated; seizure activity  8/17- rectal tube placed 8/20 - trach placed  Pt failed swallow study yesterday. SLP recommends trying to get trach to cuffless so an MBS can be performed. Per MD, IR being consulted to assess for possible PEG placement in case pt fails MBS. PT/OT recommending pt go to CIR.  Labs: CBGs 199-245 (Diabetes coordinator following) Medications: Novolog, Lantus, Senokot  Current TF: Osmolite 1.5 @ 55 ml/hr with 71m Prosource TF BID. Provides 2060 kcal, 105 gm protein, 1006 ml free water daily  Diet Order:   Diet Order    None      EDUCATION NEEDS:   No education needs have been identified at this time  Skin:  Skin Assessment: Reviewed RN Assessment  Last BM:  125 ml via rectal tube  Height:   Ht Readings from Last 1 Encounters:  10/10/19 _0  (1.854 m)    Weight:   Wt Readings from Last 1 Encounters:  11/03/19 77.9 kg    Ideal Body Weight:  83.6 kg  BMI:  Body mass index is 22.67  kg/m.  Estimated Nutritional Needs:   Kcal:  1900-2100  Protein:  100-115 grams  Fluid:  > 2 L    ALarkin Ina MS, RD, LDN RD pager number and weekend/on-call pager number located in [place]

## 2019-11-03 NOTE — Progress Notes (Signed)
Modified Barium Swallow Progress Note  Patient Details  Name: Corey Bennett MRN: [id number] Date of Birth: [birth year]  Today's Date: 11/03/2019  Modified Barium Swallow completed.  Full report located under Chart Review in the Imaging Section.  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  Pt is unable to achieve any lingual propulsive movement; tongue tip does not rise to meet palate during transit and pt can only trigger a swallow with passive spillage of liquids. Attempted puree with no posterior movement of the bolus. When there is sensory feedback in the pharynx, pt is able to trigger a partial swallow with tongue pody meeting the palate, weak base of tongue propulsion, inefficient pharyngeal persitalsis and  incomplete hyoid excursion and UES opening. There are at times, better swallow than others, particulalry when the bolus is gathered in the valleculae. There was potential for a head tilt/tuck left. Attempted a head turn R/L but pt struggled with oral transit. Duirng weak swallowing efforts, pt begins to aspirate during and after the swallow with high suspicion for decreased laryngeal adduction, particularly given dysphonia. With PMSV pt was able to achieve some effective coughs and eject most aspirate. Pt will have a long course of dysphagia rehabilitation and may need ENT consult to address laryngeal closure. Recommend long term artifical nutrition with ongoing therapeutic efforts to target lingual and pharyngeal ROM and strength and motor control.  Called pts brother who reported that prior to this admission, but after his stroke, pt had no dysphagia. There was no nueorgenic dysphagia after initial stroke and pt was drinking tea and eating chicken  nuggets at a restaurant a few days prior to this admission. Further workup into neurologic etiology of this dysphagia is needed.    Swallow Evaluation Recommendations       SLP Diet Recommendations: NPO;Alternative means - long-term                       Oral Care Recommendations: Oral care QID   Other Recommendations: Have oral suction available   Harlon Ditty, MA CCC-SLP  Acute Rehabilitation Services Pager [phone] Office [phone]   Claudine Mouton 11/03/2019,10:24 AM

## 2019-11-03 NOTE — Progress Notes (Signed)
Request to IR for g-tube placement - patient previously approved and had been planned for placement 8/23 however he wanted to hold at that time. Team now requesting placement to proceed.  Plavix will need to be held again x 5 days, per chart last dose 10/3019 at 0847 - will plan for placement Monday 9/6.  Lynnette Caffey, PA-C

## 2019-11-03 NOTE — Progress Notes (Signed)
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 Patient is living in a assisted living facility and can ambulate by himself. Patient was found by staff to have a right sided twitching with right eye deviation. He was given Versed and Dilantin and was intubated for airway protection.  Patient was extubated on 8/11 and his seizures was controlled with Keppra, phenytoin, and Vimpat.He was transferred to [address] for medical management  On 8/14,patient continued to have seizure with twitching of the right upper extremity. 2 mg of lorazepam was given. Patient continued to desat in the 80s on 15 L of nonrebreather mask with altered mental status. Patient was re intubated to protect airway and transferred to the ICU. On 10/22/2019 he had a regular tracheostomy and 8/23 was going to get a PEG tube placement but this was deferred to his request and he ended up getting a Cortrak placement and is currently getting tube feeds.  Currently he has failed his swallowing significantly and SLP is following closely.  We will continue to follow swallowing rehabilitation prior to PEG tube placement as SLP recommend strict n.p.o.  On 8/27/21he failed his swallow study again and I discussed with him about PEG tube placement and he wants to wait the weekend and if he feels he can monitor that he will likely be agreeable.  10/31/19 Started coughing significantly and trying to cough up some sputum.  Nurse was in the return to suction him.  His LFTs were abnormal so discussed with him about obtaining ultrasound and he is agreeable.  Given a dose of Lasix yesterday.  11/01/19 Respiratory Status is a little improved. LFTs remain elevated but  are stable. No recurrent fevers and U/A showing Hematuria. He has some abdominal pain.   11/02/19 Patient still failed his swallow test and the speech therapist recommended trying to get his trach to a cuffless so they can do MBS for better evaluation.  Pulmonary was asked to see the patient and they feel that he is okay to change to a cuffless #6 trach but that may not be enough to help his dysphagia as they feel that is neurologically mediated.  Will consult IR for further evaluation and management for possible PEG tube.  PT OT recommending CIR so we have consulted inpatient rehab.  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  He also additionally has a right cranial nerve X impairment with potential right focal focal paresis.  We will obtain a MRI of the brain to evaluate but he may need an ENT evaluation to address laryngeal closure.  We will follow up on the MRI first and continue antibiotics for 3 more days to complete the course of 7 days given concern for pneumonia.  Assessment & Plan:   Principal Problem:   Status epilepticus ([place]) Active Problems:   Carotid artery stenosis   Seizure (HCC)   CVA (cerebrovascular accident due to intracerebral hemorrhage) (HCC)/ left frontal lobe   AKI (acute kidney injury) ([place])   Essential hypertension   Type 2 diabetes mellitus with hyperlipidemia (HCC)  Chronic respiratory  failure with hypoxemia requiring tracheostomy tube placement and mechanical ventilatory support. -Trach collar placed 8/23, remains in place -SpO2: 97 % O2 Flow Rate (L/min): 5 L/min FiO2 (%): 28 %; Wean as tolerated  -Continue with duo nebs 3 mL nebulizer every 6 hours as needed for wheezing or shortness of breath -Speech path to reevaluate swallow and he failed again -Oral suction with tube care    -Transferred to PCU -CXR 8/27 showed "Interim removal of left PICC line. Tracheostomy tube and feeding tube in stable position. Cardiomegaly. Diffuse bilateral interstitial prominence noted on today's exam. Small right pleural effusion noted on today's exam. CHF could present in this fashion. Pneumonitis cannot be excluded." -Repeat CXR 8/28 showed "BILATERAL pulmonary infiltrates slightly greater on RIGHT, increased since previous exam; this could represent pneumonia or less likely asymmetric edema. Questionable layered RIGHT pleural effusion." -CXR today showed "Lines and tubes in stable position. Bilateral pulmonary infiltrates/edema again noted without interim change." -Patient was given 2 doses of IV Lasix and he was started on IV ceftriaxone for possible fever; Will give another dose of IV Lasix 40 mg x1 -Chest x-ray showed decreased bibasilar airspace disease today. -PCCM evalauted yesterday and changed his Trach to a #6 Cuffless  Fever, improved likely in the setting of pneumonia with concern for aspiration -Unclear etiology and is 101.9 and now improved  -Chest x-ray as above -Patient's lactic acid level is 1.2 and procalcitonin level was less than 0.10 -He does have a leukocytosis and his WBC peaked to 14.9 and is now slowly improving and 14.2 -Check urinalysis given that he is having acute urinary retention to rule out a UTI -Blood cultures x2 ordered and showed NGTD 3 Days -Urinalysis done but Urine Cx not sent -U/A showed a hazy appearance with large hemoglobin, moderate leukocytes, rare bacteria, present budding yeast, greater than 50 RBCs per high-power field and 0-5 WBCs -Continue monitor temperature curve and repeat CBC in a.m. -Empirically start IV ceftriaxone for his fever pneumonia as above will stop after 7 days  Status epilepticus in the setting of recent large left MCA stroke -No arm/eye twitching since ICU transfer -Continue current AED regimen/ Dilantin and Vimpat,  transition to PO via Cortrak and po when able to swallow; Patient keeps failing his swallow studies and discussed with him and he wants to wait until Monday to re-evaluate -His phenytoin level was low even for being corrected so we will go up on his phenytoin dose and this was done yesterday  Hypertension -BP this Afternoon is 161/74-Avoid initiation of ARB in setting of AKI. -Restarted home Norovasc 5 mg on 8/23, transition all meds PO, SBP <160 -Continue with labetalol 5 mg IV every 2 as needed for high blood pressure of SBP of greater than 180.  Urinary Retention - Continue Benechol - Removed Foley however had to be reinserted the night before last as he was retaining and had 523 mL in his bladder -Urinalysis done and showed hazy appearance with 50 glucose, large hemoglobin, moderate leukocytes, rare bacteria, 0-5 WBCs, greater than 50 RBCs per high-power field and urine culture was negative and showed no growth -Continue empiric IV ceftriaxone for pneumonia as above  AKI, improved -Improved. BUN/creatinine peaked at 29/2.12 and currently stable at 25/1.22 -Avoid further nephrotoxic medications, contrast dyes, hypotension and renally adjust medications -Repeat CMP in a.m.  Diabetes type 2 with Hyperglycemia  -Continued current regimen of moderate NovoLog/scale insulin every 4 hours and may need to adjust further -CBGs ranging from 177-248 -Will resume  Lantus 12 unis sq qHS and continue monitor and will also add 2 units now every 4 scheduled  Dysphagia.  - SLP following. Deferred PEG at patient's request and now will consult IR given his continued failed swallowing and this has been tentatively scheduled for 11/09/2019 given that the patient was back on Plavix; will hold Plavix for 5 days - Continue tube feeds via Cortrak -Swallowing rehabilitation prior to PEG tube is currently been unsuccessful.  SLP recommending to try a cuffless trach and we have consulted pulmonary for this and  they recommend doing that however they feel that it may not make much of a difference given that this is neurologically mediated next-the SLP wants to do MBS and likely we can do this in the a.m. but in the interim we have also consulted interventional radiology for PEG tube placement if he continues to fail -Currently has a Flexical from Diarrhea from TF and will discontinue slowdown -We will repeat SLP evaluation in a.m. as he failed again this was gross failing.  SLP reached on meds that he is got a significant neurological issue and we will obtain a MRI to evaluate for stroke after I spoke with neurology Dr. Lorrin Goodell -Patient will likely need an ENT evaluation at some point but will first get the MRI; speech therapy did an MBS today and patient has been swallowing efforts and began to aspirate during and after the swallow with high suspicion for decreased laryngeal abduction given his dysphonia and they feel that he will have a long course of dysphagia rehabilitation and may need to ENT consult to address [place] closure and they are currently recommending long-term artificial nutrition with ongoing therapeutic efforts to target lingual and pharyngeal range of motion and strength and motor control -Continue with SLP efforts  Normocytic Anemia -Patient's hemoglobin/hematocrit is stable at 9.3/29.4 -Check anemia panel in the a.m.  -Continue to monitor for signs and symptoms of bleeding; currently no overt bleeding noted Repeat CBC in a.m. -We will resume VTE prophylaxis as well as his home clopidogrel however his clopidogrel will be held now again for PEG tube placement  Leukocytosis -Likely reactive but he did spike a temperature and is on IV Ceftriaxone likely in the setting of pneumonia -The patient's WBC went from 10.0 is now 14.9 and is now 14.2 -> 9.9 -> 9.0 -> 8.5 -Continue to monitor and trend and since he spiked a temperature we will panculture -Repeat CBC in  a.m.  Thrombocytosis -Likely reactive -Continue to monitor and trend -Patient's platelet count has gone from 460 -> 529 -> 478 -> 454 -> 447 -> 476  Hx of Left MCA stroke with residual aphasia and right hemiparesis  -Continue with aspirin 81 mg p.o. daily, atorvastatin 80 mg p.o. nightly, via Cortrack -Hold Clopidogrel 75 mg via Cortrak for PEG placement -We will obtain PT OT to further evaluate and treat and he will need SNF versus CIR and CIR has been consulted  Right arm swelling  -The setting of dependent edema but also has diagnosed SVT in the cephalic and basilic veins; Repeat Duplex continues to show SVTs -Likely the cause of his swelling is his SVTs -Continue with OT  Abnormal LFT's, not worsening -AST has worsened and gone from 37 -> 41 -> 80 -> 80 -> 83 -> 82 -ALT has gone from 45 -> 46 -> 74 -> 85 -> 92 -> 90 -RUQ U/s done and showed "Unremarkable right upper quadrant. Small right pleural effusion." -Continue monitor and trend daily  until improving  DVT prophylaxis: Enoxaparin 40 mg sq q24h Code Status: FULL CODE  Family Communication: No family present at bedside  Disposition Plan: SNF vs CIR  Consultants:   PCCM Transfer  Neurology  Procedures:  8/8 ETT 8/14 ETT 08/19 Tracheostomy  8/23 PEG tube  Significant Diagnostic Tests:  8/8 CT head>>No acute intracranial abnormality. Large area of encephalomalacia throughout the left MCA territory, consistent with remote infarct. 8/8 CXR>>lungs clear 8/8 UDS>>+ benzo (but given versed in ED); o/w negative 8/8 MRI of brain>>large territory chronic hemorrhagic infarct in the left frontal lobe.Ill-defined hyperintensity throughout the left basal ganglia and left thalamus with mild enhancement in dobutamine likely secondary to encephalitis, infiltrating tumor or atypical subacute infarction 8/8 MRI of face and trigeminal region>>rules out soft tissue abscess 08/17 CXR>>improved aeration to the right lung base   8/17 CT Abdomen wo Contrast >>cholelithiasis/trace ascites/aorthic atherosclerosis   Micro Data:  8/8 Sars-CoV-2>>negative 8/8 BCx2>> no growth 8/8 UC>>Klebsiella pneumonia 08/14 BCx>> negative 08/14 Trach Cx>> gram positive rods and cocci in pairs 08/17 Trach Cx>> negative    Antimicrobials:  Anti-infectives (From admission, onward)   Start     Dose/Rate Route Frequency Ordered Stop   10/30/19 1400  cefTRIAXone (ROCEPHIN) 2 g in sodium chloride 0.9 % 100 mL IVPB        2 g 200 mL/hr over 30 Minutes Intravenous Every 24 hours 10/30/19 1251     10/18/19 2300  vancomycin (VANCOREADY) IVPB 750 mg/150 mL  Status:  Discontinued        750 mg 150 mL/hr over 60 Minutes Intravenous Every 24 hours 10/18/19 1359 10/19/19 1217   10/18/19 0200  vancomycin (VANCOREADY) IVPB 500 mg/100 mL  Status:  Discontinued        500 mg 100 mL/hr over 60 Minutes Intravenous Every 12 hours 10/17/19 1242 10/18/19 1359   10/17/19 1245  vancomycin (VANCOCIN) IVPB 1000 mg/200 mL premix        1,000 mg 200 mL/hr over 60 Minutes Intravenous  Once 10/17/19 1242 10/17/19 1441   10/17/19 1245  piperacillin-tazobactam (ZOSYN) IVPB 3.375 g        3.375 g 12.5 mL/hr over 240 Minutes Intravenous Every 8 hours 10/17/19 1242 10/24/19 2359   10/17/19 0900  ampicillin-sulbactam (UNASYN) 1.5 g in sodium chloride 0.9 % 100 mL IVPB  Status:  Discontinued        1.5 g 200 mL/hr over 30 Minutes Intravenous Every 6 hours 10/17/19 0809 10/17/19 0850   10/17/19 0900  Ampicillin-Sulbactam (UNASYN) 3 g in sodium chloride 0.9 % 100 mL IVPB  Status:  Discontinued        3 g 200 mL/hr over 30 Minutes Intravenous Every 6 hours 10/17/19 0850 10/17/19 1242   10/14/19 0600  vancomycin (VANCOREADY) IVPB 500 mg/100 mL  Status:  Discontinued        500 mg 100 mL/hr over 60 Minutes Intravenous Every 12 hours 10/14/19 0057 10/16/19 1534   10/12/19 1600  ampicillin (OMNIPEN) 2 g in sodium chloride 0.9 % 100 mL IVPB  Status:   Discontinued        2 g 300 mL/hr over 20 Minutes Intravenous Every 6 hours 10/12/19 1400 10/14/19 0934   10/12/19 0000  vancomycin (VANCOREADY) IVPB 750 mg/150 mL  Status:  Discontinued        750 mg 150 mL/hr over 60 Minutes Intravenous Every 12 hours 10/11/19 1254 10/14/19 0046   10/11/19 1230  cefTRIAXone (ROCEPHIN) 2 g in sodium chloride 0.9 %  100 mL IVPB  Status:  Discontinued        2 g 200 mL/hr over 30 Minutes Intravenous Every 12 hours 10/11/19 1133 10/16/19 1534   10/11/19 1230  ampicillin (OMNIPEN) 2 g in sodium chloride 0.9 % 100 mL IVPB  Status:  Discontinued        2 g 300 mL/hr over 20 Minutes Intravenous Every 4 hours 10/11/19 1133 10/12/19 1400   10/11/19 1200  vancomycin (VANCOREADY) IVPB 1500 mg/300 mL        1,500 mg 150 mL/hr over 120 Minutes Intravenous  Once 10/11/19 1135 10/11/19 1419   10/11/19 1145  vancomycin (VANCOCIN) IVPB 1000 mg/200 mL premix  Status:  Discontinued        1,000 mg 200 mL/hr over 60 Minutes Intravenous  Once 10/11/19 1133 10/11/19 1135      Subjective: Seen and examined at bedside he failed his MBS and continues to be aphasic.  Denied complaints at this time but did have some abdominal pain.  Understands that he will need a PEG tube and is agreeable so we will hold his Plavix.  Will obtain MRI first to evaluate why his swallowing is so impaired after discussion with neurology.  No other concerns or complaints this time and may need an ENT evaluation as well.  Objective: Vitals:   11/03/19 1121 11/03/19 1157 11/03/19 1400 11/03/19 1522  BP: (!) 173/72   (!) 148/66  Pulse: 82 82 77 80  Resp: 16 14 (!) 21 19  Temp: 98.3 F (36.8 C)   97.8 F (36.6 C)  TempSrc: Oral   Oral  SpO2: 99% 100% 95% 97%  Weight:      Height:        Intake/Output Summary (Last 24 hours) at 11/03/2019 1952 Last data filed at 11/03/2019 1500 Gross per 24 hour  Intake 1409.6 ml  Output 1700 ml  Net -290.4 ml   Filed Weights   11/01/19 0342 11/02/19 0414  11/03/19 0500  Weight: 76.7 kg 75.6 kg 77.9 kg   Examination: Physical Exam:  Constitutional: The patient is a thin chronically ill-appearing Caucasian male in no acute distress appears calm Eyes: Lids and conjunctivae normal, sclerae anicteric  ENMT: External Ears, Nose appear normal.  Has a Cortrak in place Neck: Appears normal, supple, no cervical masses, normal ROM, no appreciable thyromegaly: No JVD Respiratory: Diminished to auscultation bilaterally with coarse breath sounds and some crackles, no wheezing, rales, rhonchi or crackles. Normal respiratory effort and patient is not tachypenic. No accessory muscle use.  Wearing ATC has unlabored breathing Cardiovascular: RRR, no murmurs / rubs / gallops. S1 and S2 auscultated.  No extremity edema noted Abdomen: Soft, non-tender, non-distended. Bowel sounds positive.  GU: Deferred.  Has a Flexi-Seal in urinary catheter in place Musculoskeletal: No clubbing / cyanosis of digits/nails. No joint deformity upper and lower extremities.  Skin: No rashes, lesions, ulcers on limited skin evaluation. No induration; Warm and dry.  Neurologic: CN 2-12 grossly intact with no focal deficits. Romberg sign and cerebellar reflexes not assessed.  Psychiatric: Normal judgment and insight. Alert and oriented x 3. Normal mood and appropriate affect.   Data Reviewed: I have personally reviewed following labs and imaging studies  CBC: Recent Labs  Lab 10/30/19 0429 10/31/19 0147 11/01/19 0927 11/02/19 0129 11/03/19 0600  WBC 14.5* 14.2* 9.9 9.0 8.5  NEUTROABS 12.2* 11.7* 7.8* 6.1 5.7  HGB 8.7* 8.7* 8.9* 9.2* 9.3*  HCT 26.7* 25.9* 27.2* 28.1* 29.4*  MCV 92.7 91.8 91.0  91.2 92.7  PLT 478* 454* 447* 473* 629*   Basic Metabolic Panel: Recent Labs  Lab 10/30/19 0429 10/31/19 0147 11/01/19 0927 11/02/19 0129 11/03/19 0600  NA 136 137 135 137 136  K 3.6 3.6 3.8 3.5 4.2  CL 102 99 98 98 99  CO2 _0 GLUCOSE 209* 222* 263* 168* 237*  BUN  20 19 22* 22* 25*  CREATININE 1.15 1.26* 1.23 1.20 1.22  CALCIUM 8.7* 8.7* 8.6* 8.8* 8.6*  MG 1.8 1.8 1.8 1.9 2.1  PHOS 3.7 4.1 3.7 4.2 4.1   GFR: Estimated Creatinine Clearance: 71.8 mL/min (by C-G formula based on SCr of 1.22 mg/dL). Liver Function Tests: Recent Labs  Lab 10/30/19 0429 10/31/19 0147 11/01/19 0927 11/02/19 0129 11/03/19 0600  AST 41 80* 80* 83* 82*  ALT 46* 74* 85* 92* 90*  ALKPHOS 147* 148* 154* 177* 165*  BILITOT 0.3 0.3 0.3 0.3 0.5  PROT 6.8 6.6 7.0 7.1 7.1  ALBUMIN 2.4* 2.2* 2.3* 2.4* 2.4*   No results for input(s): LIPASE, AMYLASE in the last 168 hours. No results for input(s): AMMONIA in the last 168 hours. Coagulation Profile: No results for input(s): INR, PROTIME in the last 168 hours. Cardiac Enzymes: No results for input(s): CKTOTAL, CKMB, CKMBINDEX, TROPONINI in the last 168 hours. BNP (last 3 results) No results for input(s): PROBNP in the last 8760 hours. HbA1C: No results for input(s): HGBA1C in the last 72 hours. CBG: Recent Labs  Lab 11/02/19 2322 11/03/19 0313 11/03/19 0815 11/03/19 1126 11/03/19 1644  GLUCAP 199* 225* 245* 268* 188*   Lipid Profile: No results for input(s): CHOL, HDL, LDLCALC, TRIG, CHOLHDL, LDLDIRECT in the last 72 hours. Thyroid Function Tests: No results for input(s): TSH, T4TOTAL, FREET4, T3FREE, THYROIDAB in the last 72 hours. Anemia Panel: No results for input(s): VITAMINB12, FOLATE, FERRITIN, TIBC, IRON, RETICCTPCT in the last 72 hours. Sepsis Labs: Recent Labs  Lab 10/30/19 1343 10/30/19 1645 10/31/19 0147 11/01/19 0128  PROCALCITON <0.10  --  <0.10 <0.10  LATICACIDVEN 1.2 1.2  --   --     Recent Results (from the past 240 hour(s))  Culture, blood (routine x 2)     Status: None (Preliminary result)   Collection Time: 10/30/19  1:43 PM   Specimen: BLOOD  Result Value Ref Range Status   Specimen Description BLOOD LEFT ANTECUBITAL  Final   Special Requests   Final    BOTTLES DRAWN AEROBIC ONLY  Blood Culture results may not be optimal due to an inadequate volume of blood received in culture bottles   Culture   Final    NO GROWTH 4 DAYS Performed at [facility], [place] [address], [place], [place] [address]    Report Status PENDING  Incomplete  Culture, blood (routine x 2)     Status: None (Preliminary result)   Collection Time: 10/30/19  1:43 PM   Specimen: BLOOD  Result Value Ref Range Status   Specimen Description BLOOD BLOOD LEFT HAND  Final   Special Requests   Final    BOTTLES DRAWN AEROBIC AND ANAEROBIC Blood Culture adequate volume   Culture   Final    NO GROWTH 4 DAYS Performed at [facility], [place] [address] [place], [place] [address]    Report Status PENDING  Incomplete  Culture, Urine     Status: None   Collection Time: 10/31/19 11:35 PM   Specimen: Urine, Catheterized  Result Value Ref Range Status  Specimen Description URINE, CATHETERIZED  Final   Special Requests NONE  Final   Culture   Final    NO GROWTH Performed at [facility], [address], [place], [place] [address]    Report Status 11/03/2019 FINAL  Final     RN Pressure Injury Documentation:     Estimated body mass index is 22.67 kg/m as calculated from the following:   Height as of this encounter: _0  (1.854 m).   Weight as of this encounter: 77.9 kg.  Malnutrition Type:  Nutrition Problem: Inadequate oral intake Etiology: inability to eat   Malnutrition Characteristics:  Signs/Symptoms: NPO status   Nutrition Interventions:  Interventions: Tube feeding    Radiology Studies: DG CHEST PORT 1 VIEW  Result Date: 11/03/2019 CLINICAL DATA:  Shortness of breath. EXAM: PORTABLE CHEST 1 VIEW COMPARISON:  11/02/2019. FINDINGS: Tracheostomy tube, feeding tube in stable position. Stable cardiomegaly. Bilateral pulmonary infiltrates/edema again noted without interim change. Right costophrenic angle incompletely imaged. No prominent pleural effusion. No pneumothorax  IMPRESSION: 1.  Lines and tubes in stable position. 2. Bilateral pulmonary infiltrates/edema again noted without interim change. Electronically Signed   By: [place]   On: 11/03/2019 07:45   DG CHEST PORT 1 VIEW  Result Date: 11/02/2019 CLINICAL DATA:  Dyspnea. EXAM: PORTABLE CHEST 1 VIEW COMPARISON:  10/31/2019 FINDINGS: Tracheostomy tube and feeding tube remain in place. Heart size remains within normal limits. Pulmonary hyperinflation is again seen. Decreased airspace disease is seen in both lung bases. IMPRESSION: Decreased bibasilar airspace disease. Electronically Signed   By: Marlaine Hind M.D.   On: 11/02/2019 08:09     Scheduled Meds:  amLODipine  10 mg Per Tube Daily   aspirin  81 mg Per Tube Daily   atorvastatin  80 mg Per Tube Daily   bethanechol  15 mg Per Tube TID   chlorhexidine gluconate (MEDLINE KIT)  15 mL Mouth Rinse BID   Chlorhexidine Gluconate Cloth  6 each Topical Daily   clopidogrel  75 mg Per Tube Daily   enoxaparin (LOVENOX) injection  40 mg Subcutaneous Q24H   feeding supplement (PROSource TF)  45 mL Per Tube BID   Gerhardt's butt cream   Topical Daily   insulin aspart  0-15 Units Subcutaneous Q4H   insulin glargine  12 Units Subcutaneous QHS   lacosamide  150 mg Per Tube BID   levETIRAcetam  1,000 mg Per Tube BID   mouth rinse  15 mL Mouth Rinse q12n4p   phenytoin  150 mg Per Tube TID   QUEtiapine  50 mg Per Tube QHS   sennosides  5 mL Per Tube BID   Continuous Infusions:  sodium chloride Stopped (10/15/19 1834)   cefTRIAXone (ROCEPHIN)  IV 2 g (11/03/19 1415)   feeding supplement (OSMOLITE 1.5 CAL) 1,000 mL (11/03/19 1750)    LOS: 23 days   Kerney Elbe, DO [facility] PAGER is on AMION  If 7PM-7AM, please contact night-coverage [email or web address]

## 2019-11-03 NOTE — Plan of Care (Signed)
  Problem: Activity: Goal: Ability to tolerate increased activity will improve Outcome: Progressing   Problem: Role Relationship: Goal: Method of communication will improve Outcome: Progressing   Problem: Education: Goal: Knowledge of General Education information will improve Description: Including pain rating scale, medication(s)/side effects and non-pharmacologic comfort measures Outcome: Progressing

## 2019-11-04 ENCOUNTER — Inpatient Hospital Stay (HOSPITAL_COMMUNITY): Payer: BC Managed Care – PPO

## 2019-11-04 LAB — CBC WITH DIFFERENTIAL/PLATELET
Abs Immature Granulocytes: 0.04 10*3/uL (ref 0.00–0.07)
Basophils Absolute: 0.1 10*3/uL (ref 0.0–0.1)
Basophils Relative: 1 %
Eosinophils Absolute: 0.5 10*3/uL (ref 0.0–0.5)
Eosinophils Relative: 6 %
HCT: 30.2 % — ABNORMAL LOW (ref 39.0–52.0)
Hemoglobin: 9.9 g/dL — ABNORMAL LOW (ref 13.0–17.0)
Immature Granulocytes: 1 %
Lymphocytes Relative: 14 %
Lymphs Abs: 1.2 10*3/uL (ref 0.7–4.0)
MCH: 30 pg (ref 26.0–34.0)
MCHC: 32.8 g/dL (ref 30.0–36.0)
MCV: 91.5 fL (ref 80.0–100.0)
Monocytes Absolute: 0.7 10*3/uL (ref 0.1–1.0)
Monocytes Relative: 8 %
Neutro Abs: 6 10*3/uL (ref 1.7–7.7)
Neutrophils Relative %: 70 %
Platelets: 449 10*3/uL — ABNORMAL HIGH (ref 150–400)
RBC: 3.3 MIL/uL — ABNORMAL LOW (ref 4.22–5.81)
RDW: 13.8 % (ref 11.5–15.5)
WBC: 8.4 10*3/uL (ref 4.0–10.5)
nRBC: 0 % (ref 0.0–0.2)

## 2019-11-04 LAB — COMPREHENSIVE METABOLIC PANEL
ALT: 84 U/L — ABNORMAL HIGH (ref 0–44)
AST: 64 U/L — ABNORMAL HIGH (ref 15–41)
Albumin: 2.6 g/dL — ABNORMAL LOW (ref 3.5–5.0)
Alkaline Phosphatase: 167 U/L — ABNORMAL HIGH (ref 38–126)
Anion gap: 13 (ref 5–15)
BUN: 26 mg/dL — ABNORMAL HIGH (ref 6–20)
CO2: 26 mmol/L (ref 22–32)
Calcium: 8.8 mg/dL — ABNORMAL LOW (ref 8.9–10.3)
Chloride: 98 mmol/L (ref 98–111)
Creatinine, Ser: 1.27 mg/dL — ABNORMAL HIGH (ref 0.61–1.24)
GFR calc Af Amer: >60 mL/min (ref >60)
GFR calc non Af Amer: >60 mL/min (ref >60)
Glucose, Bld: 177 mg/dL — ABNORMAL HIGH (ref 70–99)
Potassium: 3.5 mmol/L (ref 3.5–5.1)
Sodium: 137 mmol/L (ref 135–145)
Total Bilirubin: 0.4 mg/dL (ref 0.3–1.2)
Total Protein: 7.7 g/dL (ref 6.5–8.1)

## 2019-11-04 LAB — CULTURE, BLOOD (ROUTINE X 2)
Culture: NO GROWTH
Culture: NO GROWTH
Special Requests: ADEQUATE

## 2019-11-04 LAB — GLUCOSE, CAPILLARY
Glucose-Capillary: 165 mg/dL — ABNORMAL HIGH (ref 70–99)
Glucose-Capillary: 198 mg/dL — ABNORMAL HIGH (ref 70–99)
Glucose-Capillary: 198 mg/dL — ABNORMAL HIGH (ref 70–99)
Glucose-Capillary: 199 mg/dL — ABNORMAL HIGH (ref 70–99)
Glucose-Capillary: 240 mg/dL — ABNORMAL HIGH (ref 70–99)
Glucose-Capillary: 255 mg/dL — ABNORMAL HIGH (ref 70–99)

## 2019-11-04 LAB — PHOSPHORUS: Phosphorus: 4.4 mg/dL (ref 2.5–4.6)

## 2019-11-04 LAB — MAGNESIUM: Magnesium: 2 mg/dL (ref 1.7–2.4)

## 2019-11-04 IMAGING — MR MR HEAD W/O CM
10 of 14 series · 32 of 48 positions shown · non-contrast
Comparison: [DATE]

CLINICAL DATA: Cranial neuropathy (9). Severe oro pharyngeal
dysphagia with impairment of cranial nerve 12 and vocal cord paresis
on the right per the chart

EXAM:
MRI HEAD WITHOUT CONTRAST
TECHNIQUE: Multiplanar, multiecho pulse sequences of the brain and surrounding
structures were obtained without intravenous contrast.

[Series 5: DWI · axial · 3.0mm · 0.88mm/px · z∈[-38,+102]mm · 7 of 100 slices shown (1 of 4)]
[im 1/100]
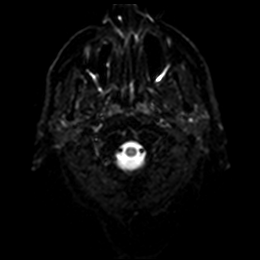
[im 17/100]
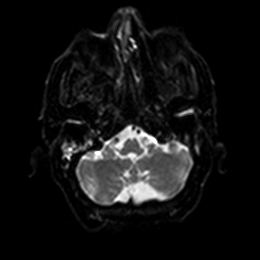
[im 34/100]
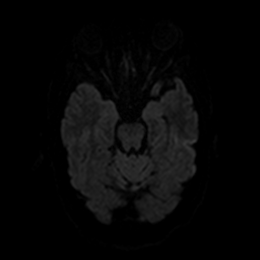
[im 50/100]
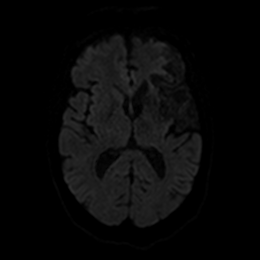
[im 67/100]
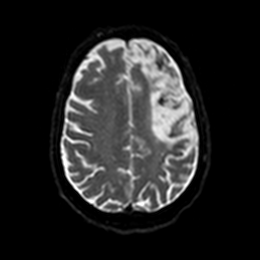
[im 83/100]
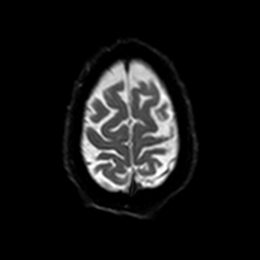
[im 100/100]
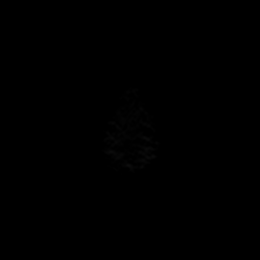

[Series 6: DWI · axial · 3.0mm · 0.88mm/px · z∈[-38,+102]mm · 4 of 50 slices shown (2 of 4)]
[im 1/50]
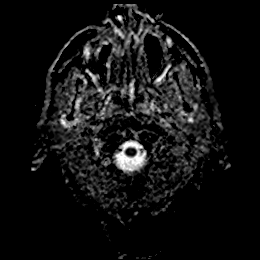
[im 17/50]
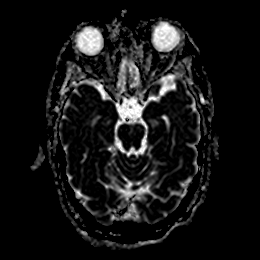
[im 33/50]
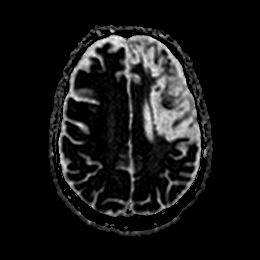
[im 50/50]
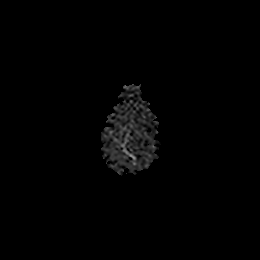

[Series 7: DWI · coronal · 4.0mm · 0.88mm/px · 5 of 64 slices shown (3 of 4)]
[im 1/64]
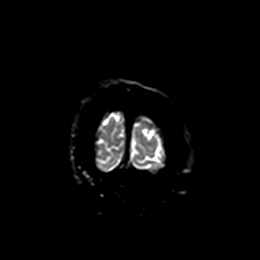
[im 16/64]
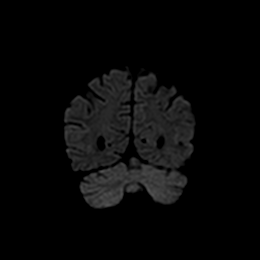
[im 32/64]
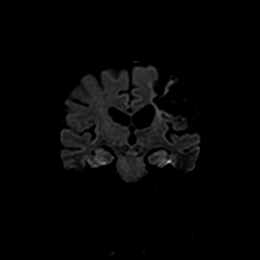
[im 48/64]
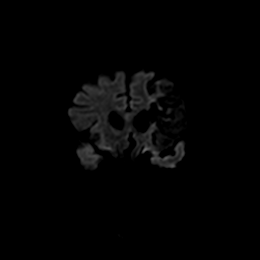
[im 64/64]
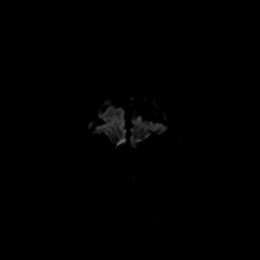

[Series 8: DWI · coronal · 4.0mm · 0.88mm/px · 3 of 32 slices shown (4 of 4)]
[im 1/32]
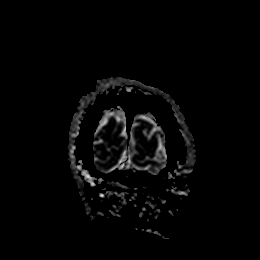
[im 16/32]
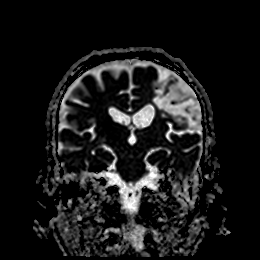
[im 32/32]
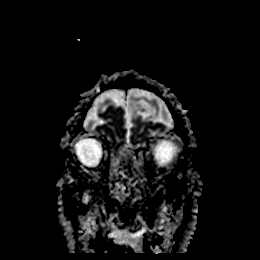

[Series 10: T2 · axial · 5.0mm · 0.72mm/px · z∈[-37,+101]mm · 2 of 25 slices shown (1 of 4)]
[im 1/25]
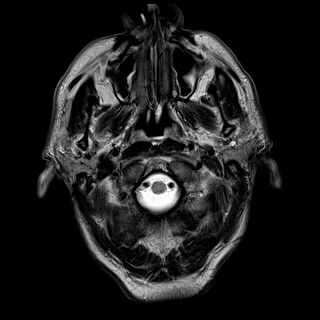
[im 25/25]
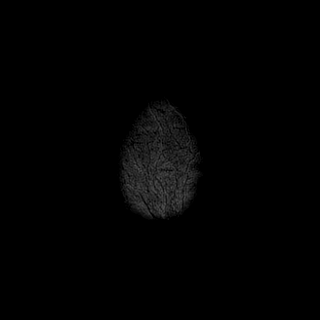

[Series 11: FLAIR · axial · 5.0mm · 0.45mm/px · z∈[-37,+101]mm · 2 of 25 slices shown (1 of 2)]
[im 1/25]
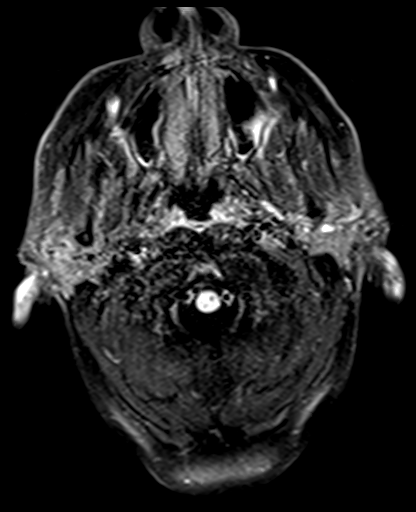
[im 25/25]
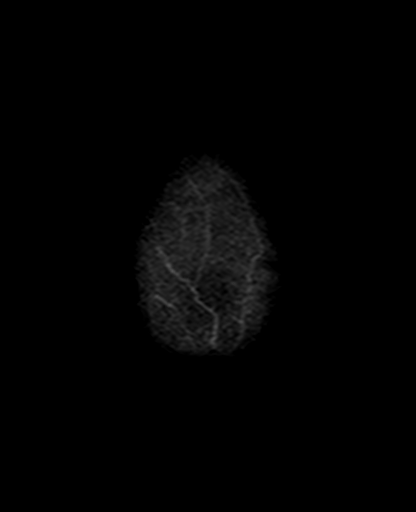

[Series 17: T2 · coronal · 3.0mm · 0.27mm/px · 3 of 32 slices shown (2 of 4)]
[im 1/32]
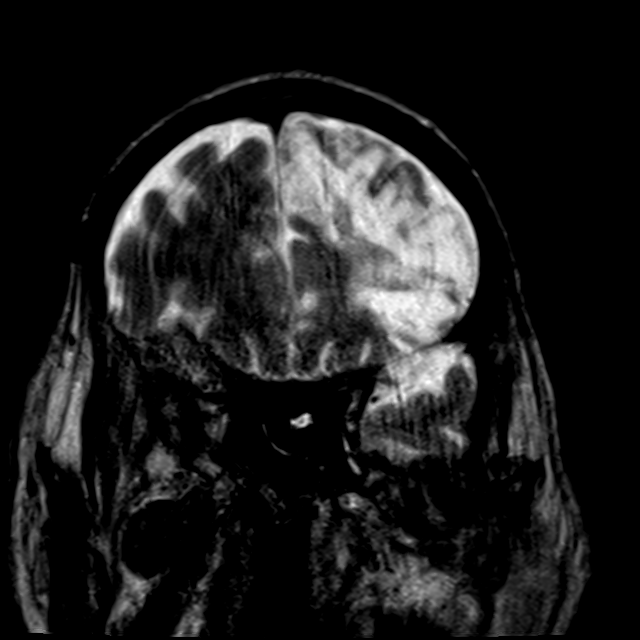
[im 16/32]
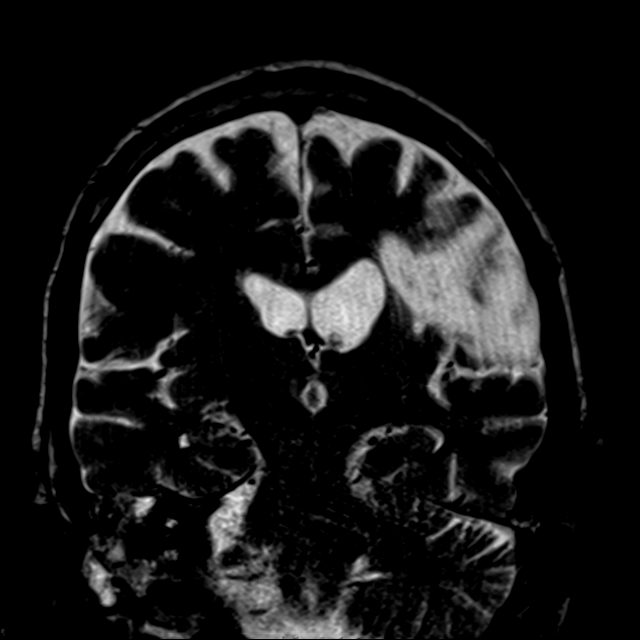
[im 32/32]
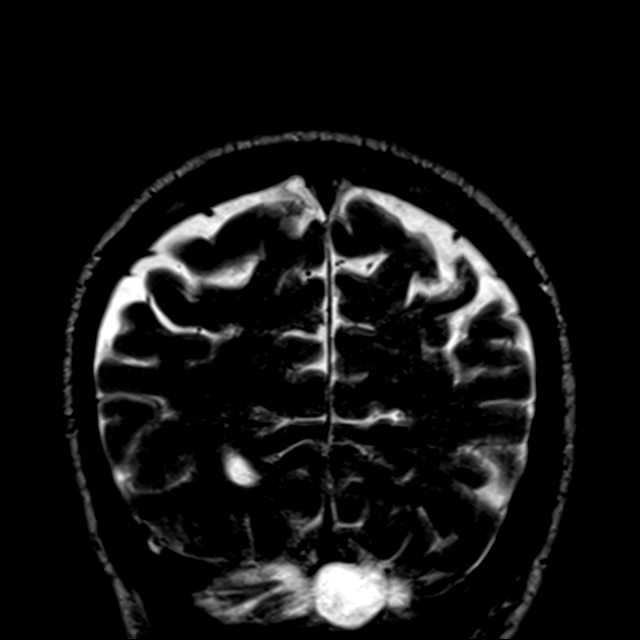

[Series 18: FLAIR · coronal · 3.0mm · 0.56mm/px · 2 of 21 slices shown (2 of 2)]
[im 1/21]
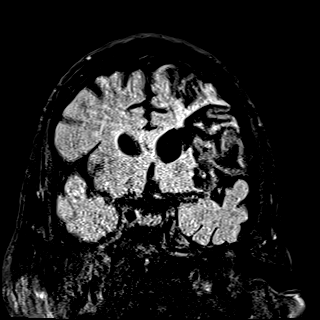
[im 21/21]
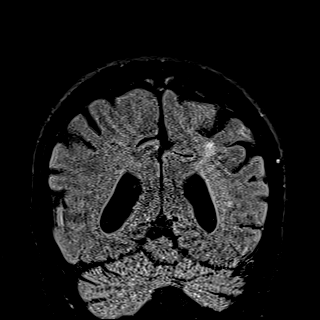

[Series 19: T2 · coronal · 5.0mm · 0.34mm/px · 2 of 29 slices shown (3 of 4)]
[im 1/29]
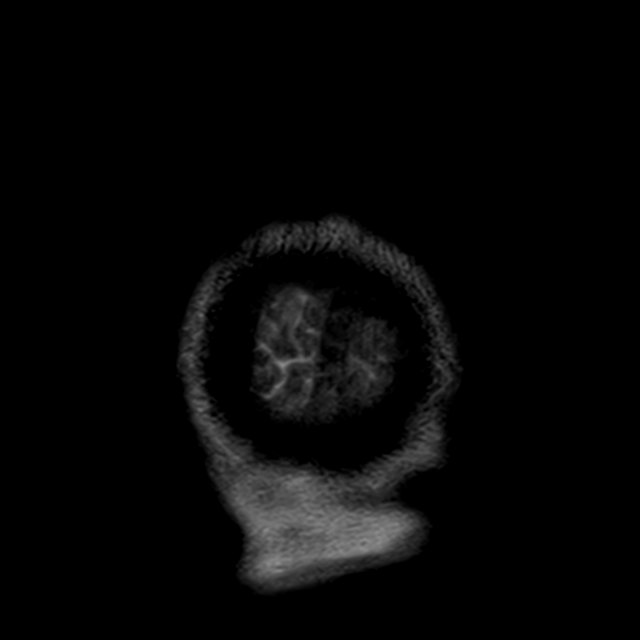
[im 29/29]
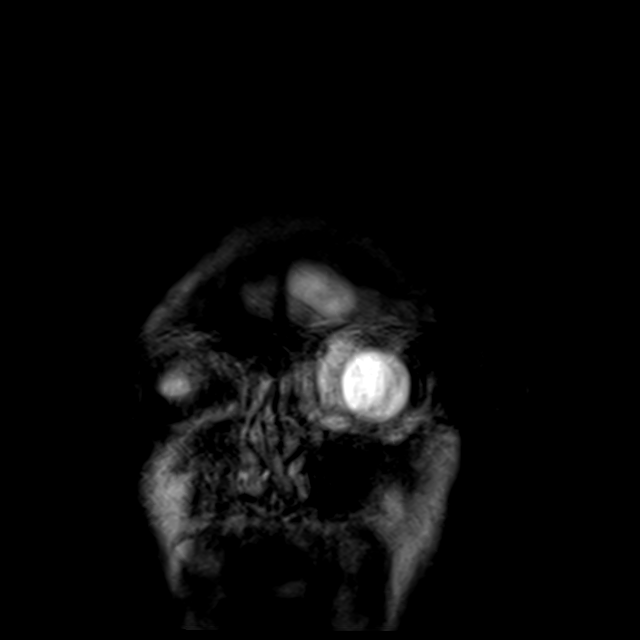

[Series 20: T2 · coronal · 5.0mm · 0.34mm/px · 2 of 29 slices shown (4 of 4)]
[im 1/29]
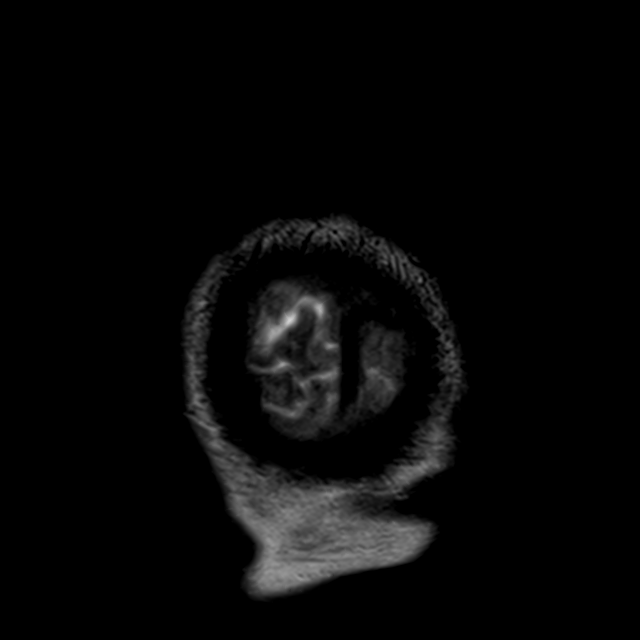
[im 29/29]
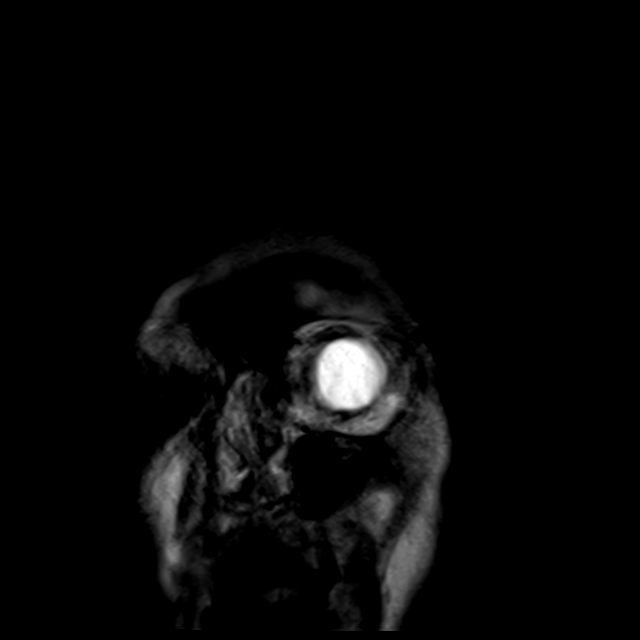

[32 of 48 positions shown; findings below may reference images not displayed]

FINDINGS: Brain: Large remote left MCA branch infarct also extending to the
parasagittal left frontal lobe, with chronic blood products. T2
hyperintensity and swelling at the left basal ganglia and upper
thalamus has subsided with volume loss, also considered post
ischemic. No acute infarct. No mass, infarct, or inflammatory
process to correlate specifically with reported cranial neuropathy.

Vascular: Normal flow voids

Skull and upper cervical spine: Normal marrow signal.

Sinuses/Orbits: Bilateral cataract resection. Mild patchy
opacification of the sinuses, improved. Bilateral mastoid
opacification with negative nasopharynx, progressed and potentially
related to intubation.
IMPRESSION: 1. No acute or specific finding to correlate with the reported
cranial neuropathy.
2. Large remote left frontal infarct. Signal abnormality and
swelling in the left deep gray nuclei has subsided since [DATE],
now with volume loss.

## 2019-11-04 IMAGING — DX DG CHEST 1V PORT
1 series · 1 of 1 positions shown · non-contrast
Comparison: [DATE]

CLINICAL DATA: Shortness of breath.

EXAM:
PORTABLE CHEST 1 VIEW

[chest ap]
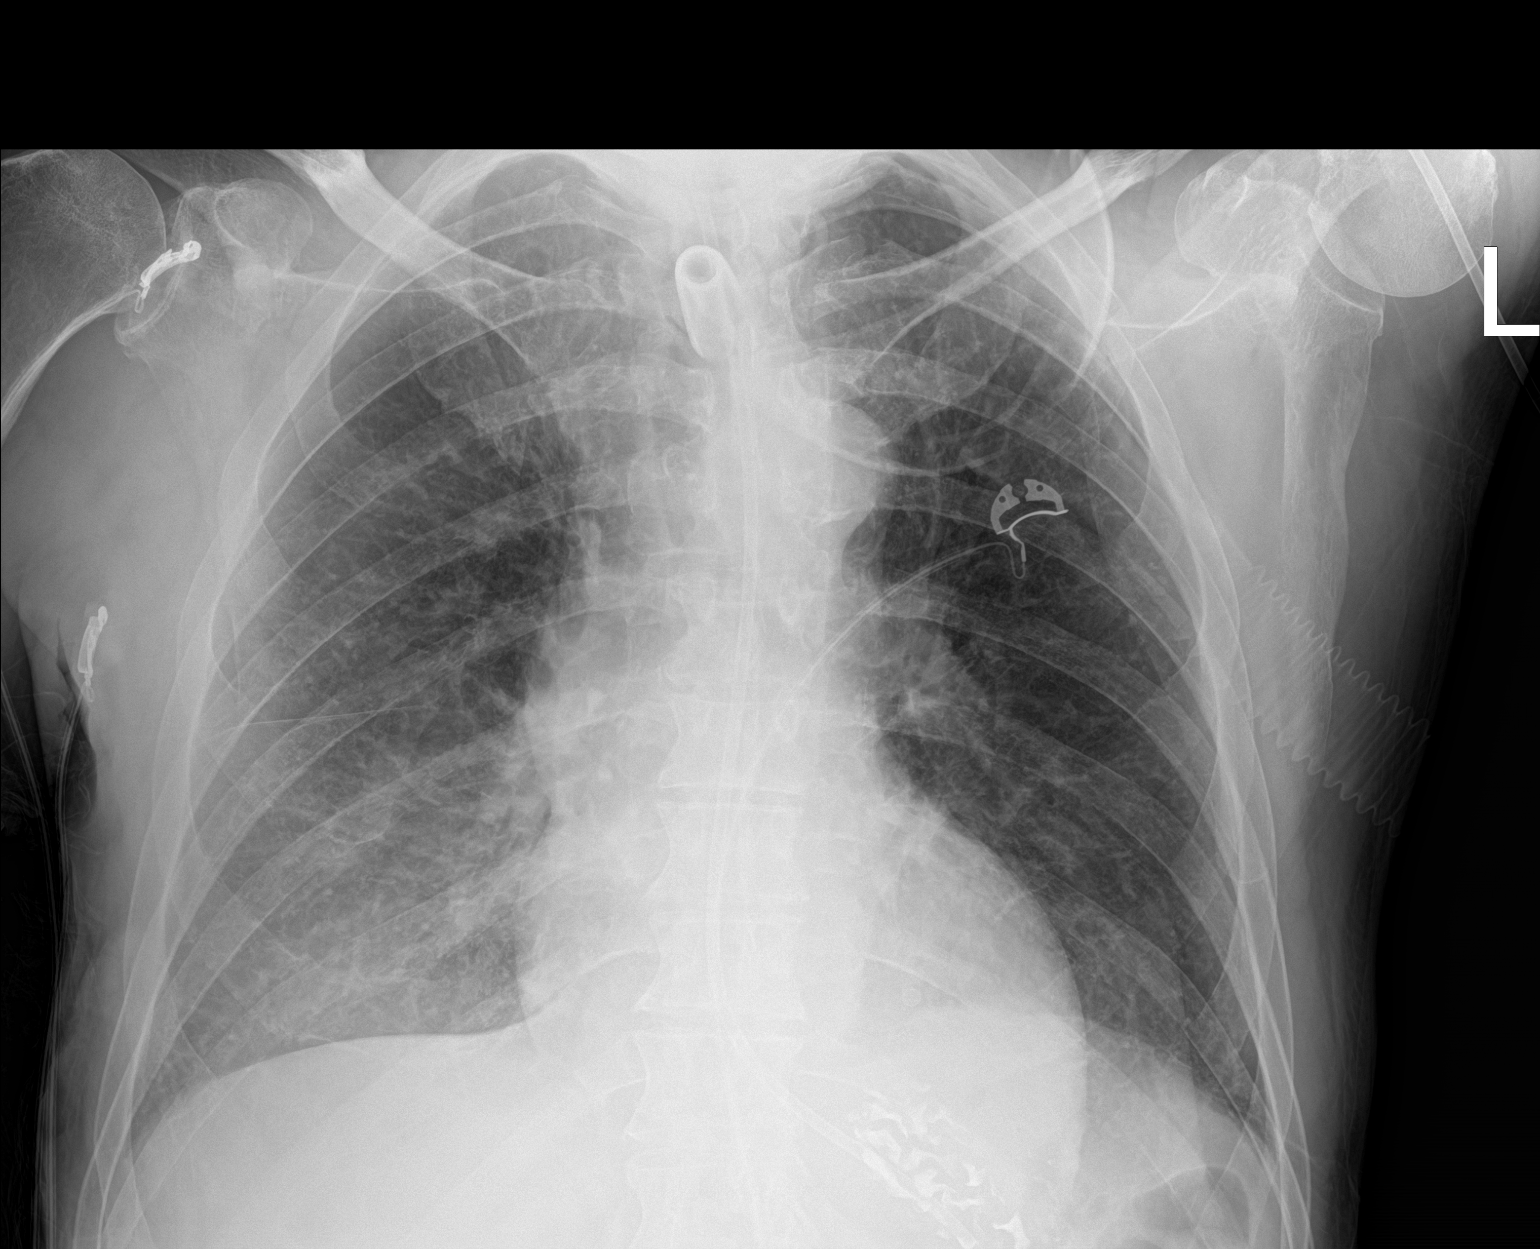

[1 of 1 positions shown; findings below may reference images not displayed]

FINDINGS: [GN] hours. Cardiopericardial silhouette is at upper limits of
normal for size. Interstitial markings are diffusely coarsened with
chronic features. Right greater than left basilar predominant hazy
airspace disease is similar although aeration in the left base has
improved in the interval. Tracheostomy tube again noted. A feeding
tube passes into the stomach although the distal tip position is not
included on the film. The visualized bony structures of the thorax
show no acute abnormality. Telemetry leads overlie the chest.
IMPRESSION: Interval improvement in left basilar aeration. Otherwise stable
exam.

## 2019-11-04 MED ORDER — SENNOSIDES-DOCUSATE SODIUM 8.6-50 MG PO TABS: 2.0000 | ORAL_TABLET | Freq: Every evening | ORAL | Status: DC | PRN

## 2019-11-04 MED ORDER — IPRATROPIUM-ALBUTEROL 0.5-2.5 (3) MG/3ML IN SOLN: 3.0000 mL | Freq: Two times a day (BID) | RESPIRATORY_TRACT | Status: DC

## 2019-11-04 NOTE — Plan of Care (Signed)

## 2019-11-04 NOTE — Progress Notes (Signed)
Physical Therapy Treatment Patient Details Name: Corey Bennett MRN: [id number] DOB: [birth year] Today's Date: 11/04/2019    History of Present Illness 59 y.o. 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  He was intubated for airway protection on 10/10/19. Pt extubated on 10/14/19.  on 8/14, pt had continued twitching of Rt UE with AMS, and decreased 02 sats.  He was intubated.  Diagnosis: status epilepticus in setting of prior large Lt MCA infarct; acute kidney injury, possible acute meningitis.  He underwent tracheostomy on 8/19/2.  PMH includes: poorly controlled DM, dysphagia, HTN    PT Comments    Pt progressing well. Pt able to tolerate ambulation today with modAx2 plus a chair follow. Pt with ataxic gait pattern and R LE adduction. Pt remains easily agitated with decreased insight to safety and deficits. Continue to recommend CIR UPon d/c for maximal functional return.   Follow Up Recommendations  CIR     Equipment Recommendations  Wheelchair (measurements PT);Wheelchair cushion (measurements PT)    Recommendations for Other Services       Precautions / Restrictions Precautions Precautions: Fall Precaution Comments: trach Restrictions Weight Bearing Restrictions: No    Mobility  Bed Mobility Overal bed mobility: Needs Assistance Bed Mobility: Supine to Sit     Supine to sit: Mod assist     General bed mobility comments: pt initiated transfer but ultimately required modA for trunk elevation and to bring LEs off EOB  Transfers Overall transfer level: Needs assistance Equipment used: 2 person hand held assist Transfers: Sit to/from Stand Sit to Stand: Mod assist;+2 physical assistance;Min assist         General transfer comment: modA to power minA to steady during transition of hands, pt did better pushing up from chair  Ambulation/Gait Ambulation/Gait assistance:  Mod assist;+2 physical assistance (3rd person for chair follow) Gait Distance (Feet): 10 Feet (x1, 30'x1) Assistive device: Rolling walker (2 wheeled) (Tech helped pt maintain grip with R hand) Gait Pattern/deviations: Step-through pattern;Decreased stride length;Trunk flexed;Ataxic;Scissoring;Narrow base of support Gait velocity: decr   General Gait Details: pt with R LE inco-ordination and adduction requiring minA to prevent cross over gait pattern, pt irriated with PT attempted to assist pt, PT tech assisted with walker management and R UE grip, RN tech pushed chair, PT focused on weight shifting and R LE advancement   Stairs             Wheelchair Mobility    Modified Rankin (Stroke Patients Only)       Balance Overall balance assessment: Needs assistance Sitting-balance support: Feet supported Sitting balance-Leahy Scale: Poor Sitting balance - Comments: UE support and close min guard assist    Standing balance support: During functional activity;Single extremity supported Standing balance-Leahy Scale: Poor Standing balance comment: pt dependent on UE support                            Cognition Arousal/Alertness: Awake/alert Behavior During Therapy: Flat affect Overall Cognitive Status: History of cognitive impairments - at baseline                                 General Comments: pt easily agitated, follows commands but demonstrates decreased insight to deficits and safety      Exercises  General Comments General comments (skin integrity, edema, etc.): VSS, Pt on 35% O2 via trach collar during ambulation, SpO2 stayed above 95% t/o amb      Pertinent Vitals/Pain Pain Assessment: Faces Faces Pain Scale: No hurt    Home Living                      Prior Function            PT Goals (current goals can now be found in the care plan section) Progress towards PT goals: Progressing toward goals    Frequency     Min 3X/week      PT Plan Current plan remains appropriate    Co-evaluation              AM-PAC PT "6 Clicks" Mobility   Outcome Measure  Help needed turning from your back to your side while in a flat bed without using bedrails?: A Little Help needed moving from lying on your back to sitting on the side of a flat bed without using bedrails?: A Little Help needed moving to and from a bed to a chair (including a wheelchair)?: A Lot Help needed standing up from a chair using your arms (e.g., wheelchair or bedside chair)?: A Lot Help needed to walk in hospital room?: A Lot Help needed climbing 3-5 steps with a railing? : Total 6 Click Score: 13    End of Session Equipment Utilized During Treatment: Gait belt Activity Tolerance: Patient tolerated treatment well Patient left: in chair;with call bell/phone within reach;with chair alarm set Nurse Communication: Mobility status Hemiplegia - Right/Left: Right Hemiplegia - caused by: Cerebral infarction     Time: 4696-2952 PT Time Calculation (min) (ACUTE ONLY): 25 min  Charges:  $Gait Training: 23-37 mins                     Corey Bennett, PT, DPT Acute Rehabilitation Services Pager #: [phone] Office #: [phone]    Corey Bennett 11/04/2019, 12:57 PM

## 2019-11-04 NOTE — Progress Notes (Signed)
PROGRESS NOTE    Corey Bennett  KVQ:[id number] DOB: [birth year] DOA: 10/10/2019 PCP: Patient, No Pcp Per   Brief Narrative:   59 year old male with past medical history of 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 Patient is living in a assisted living facility and can ambulate by himself.  Due to concerns of seizure he was intubated for airway protection and taken to the ICU.  Eventually had tracheostomy placed on 8/23.  Since then he has had multiple swallowing evaluation which he continues to fail and progressing only very slowly.  Now the plan is to allow Plavix washout and place PEG tube on 11/09/2019.    Assessment & Plan:   Principal Problem:   Status epilepticus ([place]) Active Problems:   Carotid artery stenosis   Seizure (HCC)   CVA (cerebrovascular accident due to intracerebral hemorrhage) (HCC)/ left frontal lobe   AKI (acute kidney injury) ([place])   Essential hypertension   Type 2 diabetes mellitus with hyperlipidemia (HCC)   Chronic respiratory failure with hypoxemia requiring tracheostomy tube placement and mechanical ventilatory support. -Currently has tracheostomy in place.  Continue routine supportive care with frequent suctioning.  Advised to use incentive spirometer, Mucinex twice daily, bronchodilators scheduled twice daily and every 6 hours as needed. -Oral suctioning and routine care.  Fever, improved likely in the setting of pneumonia with concern for aspiration, resolved -Concerns of possible aspiration by previous provider.  Procalcitonin levels are negative, I will discontinue the antibiotics and closely watch him.  Status epilepticus in the setting of recent large left MCA stroke -Continue Vimpat, Keppra and Dilantin  Essential hypertension -Currently on Norvasc 10 mg daily  Urinary Retention Foley catheter removed, closely monitor him.  On  bethanechol  AKI, improved Resolved.  Creatinine 1.2 around baseline  Diabetes type 2 with Hyperglycemia -Continue Accu-Cheks and sliding scale -NovoLog 2 units every 4 hours, Lantus 12 units at bedtime  Dysphagia.  After extensive efforts by speech and swallow therapy, current plans are to have PEG tube placed on 11/09/2019 after Plavix washout  Normocytic Anemia -Hemoglobin is currently stable at 9.9.   Hx of Left MCA stroke with residual aphasia and right hemiparesis  -Currently on aspirin daily, Lipitor 80 mg daily.  Plavix on hold until PEG tube placement  Right arm swelling  -Likely from superficial thrombosis, recommend warm compresses  Abnormal LFT's, not worsening -In the setting of acute illness, continue to monitor    DVT prophylaxis: Lovenox Code Status: Full code Family Communication: None  Status is: Inpatient  Remains inpatient appropriate because:Hemodynamically unstable   Dispo: The patient is from: SNF              Anticipated d/c is to: SNF              Anticipated d/c date is: > 3 days              Patient currently is not medically stable to d/c.  Currently on tube feeds, allowing for Plavix washout so PEG tube can be placed on 11/09/2019.  Maintain hospital stay:       Body mass index is 23.04 kg/m.       Subjective: Patient does not have any complaints.  He is trying to communicate but having hard time.  Able to answer basic questions for me.  He is having thickish white secretions  Review of Systems Otherwise negative except as per HPI, including: General: Denies  fever, chills, night sweats or unintended weight loss. Resp: Denies cough, wheezing, shortness of breath. Cardiac: Denies chest pain, palpitations, orthopnea, paroxysmal nocturnal dyspnea. GI: Denies abdominal pain, nausea, vomiting, diarrhea or constipation GU: Denies dysuria, frequency, hesitancy or incontinence MS: Denies muscle aches, joint pain or  swelling Neuro: Denies headache, neurologic deficits (focal weakness, numbness, tingling), abnormal gait Psych: Denies anxiety, depression, SI/HI/AVH Skin: Denies new rashes or lesions ID: Denies sick contacts, exotic exposures, travel  Examination:  Constitutional: Not in acute distress, appears chronically ill.  Core track in place Respiratory: Anterior breath sounds are rhonchorous Cardiovascular: Normal sinus rhythm, no rubs Abdomen: Nontender nondistended good bowel sounds Musculoskeletal: No edema noted Skin: No rashes seen Neurologic: CN 2-12 grossly intact.  And nonfocal Psychiatric: Normal judgment and insight. Alert and oriented x 3. Normal mood. Rectal tube in place  Objective: Vitals:   11/04/19 0804 11/04/19 0817 11/04/19 1151 11/04/19 1158  BP:  (!) 141/62 (!) 147/71 (!) 141/71  Pulse: 78 78 77 81  Resp: '19 18  16  ' Temp:  98 F (36.7 C) 98.1 F (36.7 C)   TempSrc:  Axillary Oral   SpO2: 98% 97%  99%  Weight:      Height:        Intake/Output Summary (Last 24 hours) at 11/04/2019 1353 Last data filed at 11/04/2019 0340 Gross per 24 hour  Intake 3089.68 ml  Output 2400 ml  Net 689.68 ml   Filed Weights   11/02/19 0414 11/03/19 0500 11/04/19 0348  Weight: 75.6 kg 77.9 kg 79.2 kg     Data Reviewed:   CBC: Recent Labs  Lab 10/31/19 0147 11/01/19 0927 11/02/19 0129 11/03/19 0600 11/04/19 0723  WBC 14.2* 9.9 9.0 8.5 8.4  NEUTROABS 11.7* 7.8* 6.1 5.7 6.0  HGB 8.7* 8.9* 9.2* 9.3* 9.9*  HCT 25.9* 27.2* 28.1* 29.4* 30.2*  MCV 91.8 91.0 91.2 92.7 91.5  PLT 454* 447* 473* 476* 416*   Basic Metabolic Panel: Recent Labs  Lab 10/31/19 0147 11/01/19 0927 11/02/19 0129 11/03/19 0600 11/04/19 0723  NA 137 135 137 136 137  K 3.6 3.8 3.5 4.2 3.5  CL 99 98 98 99 98  CO2 '26 26 27 23 26  ' GLUCOSE 222* 263* 168* 237* 177*  BUN 19 22* 22* 25* 26*  CREATININE 1.26* 1.23 1.20 1.22 1.27*  CALCIUM 8.7* 8.6* 8.8* 8.6* 8.8*  MG 1.8 1.8 1.9 2.1 2.0  PHOS 4.1  3.7 4.2 4.1 4.4   GFR: Estimated Creatinine Clearance: 70.2 mL/min (A) (by C-G formula based on SCr of 1.27 mg/dL (H)). Liver Function Tests: Recent Labs  Lab 10/31/19 0147 11/01/19 0927 11/02/19 0129 11/03/19 0600 11/04/19 0723  AST 80* 80* 83* 82* 64*  ALT 74* 85* 92* 90* 84*  ALKPHOS 148* 154* 177* 165* 167*  BILITOT 0.3 0.3 0.3 0.5 0.4  PROT 6.6 7.0 7.1 7.1 7.7  ALBUMIN 2.2* 2.3* 2.4* 2.4* 2.6*   No results for input(s): LIPASE, AMYLASE in the last 168 hours. No results for input(s): AMMONIA in the last 168 hours. Coagulation Profile: No results for input(s): INR, PROTIME in the last 168 hours. Cardiac Enzymes: No results for input(s): CKTOTAL, CKMB, CKMBINDEX, TROPONINI in the last 168 hours. BNP (last 3 results) No results for input(s): PROBNP in the last 8760 hours. HbA1C: No results for input(s): HGBA1C in the last 72 hours. CBG: Recent Labs  Lab 11/03/19 2111 11/03/19 2336 11/04/19 0337 11/04/19 0816 11/04/19 1147  GLUCAP 182* 177* 199* 198* 198*  Lipid Profile: No results for input(s): CHOL, HDL, LDLCALC, TRIG, CHOLHDL, LDLDIRECT in the last 72 hours. Thyroid Function Tests: No results for input(s): TSH, T4TOTAL, FREET4, T3FREE, THYROIDAB in the last 72 hours. Anemia Panel: No results for input(s): VITAMINB12, FOLATE, FERRITIN, TIBC, IRON, RETICCTPCT in the last 72 hours. Sepsis Labs: Recent Labs  Lab 10/30/19 1343 10/30/19 1645 10/31/19 0147 11/01/19 0128  PROCALCITON <0.10  --  <0.10 <0.10  LATICACIDVEN 1.2 1.2  --   --     Recent Results (from the past 240 hour(s))  Culture, blood (routine x 2)     Status: None   Collection Time: 10/30/19  1:43 PM   Specimen: BLOOD  Result Value Ref Range Status   Specimen Description BLOOD LEFT ANTECUBITAL  Final   Special Requests   Final    BOTTLES DRAWN AEROBIC ONLY Blood Culture results may not be optimal due to an inadequate volume of blood received in culture bottles   Culture   Final    NO GROWTH  5 DAYS Performed at [facility], [place] [address], [place], [place] [address]    Report Status 11/04/2019 FINAL  Final  Culture, blood (routine x 2)     Status: None   Collection Time: 10/30/19  1:43 PM   Specimen: BLOOD  Result Value Ref Range Status   Specimen Description BLOOD BLOOD LEFT HAND  Final   Special Requests   Final    BOTTLES DRAWN AEROBIC AND ANAEROBIC Blood Culture adequate volume   Culture   Final    NO GROWTH 5 DAYS Performed at [facility], [place] [address] [place], [place] [address]    Report Status 11/04/2019 FINAL  Final  Culture, Urine     Status: None   Collection Time: 10/31/19 11:35 PM   Specimen: Urine, Catheterized  Result Value Ref Range Status   Specimen Description URINE, CATHETERIZED  Final   Special Requests NONE  Final   Culture   Final    NO GROWTH Performed at [facility], [address] [place], [place] [address]    Report Status 11/03/2019 FINAL  Final         Radiology Studies: MR BRAIN WO CONTRAST  Result Date: 11/04/2019 CLINICAL DATA:  Cranial neuropathy (9). 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 COMPARISON:  10/11/2019 FINDINGS: Brain: Large remote left MCA branch infarct also extending to the parasagittal left frontal lobe, with chronic blood products. T2 hyperintensity and swelling at the left basal ganglia and upper thalamus has subsided with volume loss, also considered post ischemic. No acute infarct. 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 Vascular: Normal flow voids Skull and upper cervical spine: Normal marrow signal. Sinuses/Orbits: Bilateral cataract resection. Mild patchy opacification of the sinuses,  improved. Bilateral mastoid opacification with negative nasopharynx, progressed and potentially related to intubation. IMPRESSION: 1. 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 2. Large remote left frontal infarct. Signal abnormality and swelling in the left deep gray nuclei has subsided since 10/11/2019, now with volume loss. Electronically Signed   By: Monte Fantasia M.D.   On: 11/04/2019 07:54   DG CHEST PORT 1 VIEW  Result Date: 11/04/2019 CLINICAL DATA:  Shortness of breath. EXAM:  PORTABLE CHEST 1 VIEW COMPARISON:  11/03/2019 FINDINGS: 0643 hours. Cardiopericardial silhouette is at upper limits of normal for size. Interstitial markings are diffusely coarsened with chronic features. Right greater than left basilar predominant hazy airspace disease is similar although aeration in the left base has improved in the interval. Tracheostomy tube again noted. A feeding tube passes into the stomach although the distal tip position is not included on the film. The visualized bony structures of the thorax show no acute abnormality. Telemetry leads overlie the chest. IMPRESSION: Interval improvement in left basilar aeration. Otherwise stable exam. Electronically Signed   By: Misty Stanley M.D.   On: 11/04/2019 08:51   DG CHEST PORT 1 VIEW  Result Date: 11/03/2019 CLINICAL DATA:  Shortness of breath. EXAM: PORTABLE CHEST 1 VIEW COMPARISON:  11/02/2019. FINDINGS: Tracheostomy tube, feeding tube in stable position. Stable cardiomegaly. Bilateral pulmonary infiltrates/edema again noted without interim change. Right costophrenic angle incompletely imaged. No prominent pleural effusion. No pneumothorax IMPRESSION: 1.  Lines and tubes in stable position. 2. Bilateral pulmonary infiltrates/edema again noted without interim change. Electronically Signed   By: [place]   On: 11/03/2019 07:45        Scheduled Meds: . amLODipine  10 mg Per Tube Daily  . aspirin  81 mg Per  Tube Daily  . atorvastatin  80 mg Per Tube Daily  . bethanechol  15 mg Per Tube TID  . chlorhexidine gluconate (MEDLINE KIT)  15 mL Mouth Rinse BID  . Chlorhexidine Gluconate Cloth  6 each Topical Daily  . clopidogrel  75 mg Per Tube Daily  . enoxaparin (LOVENOX) injection  40 mg Subcutaneous Q24H  . feeding supplement (PROSource TF)  45 mL Per Tube BID  . Gerhardt's butt cream   Topical Daily  . insulin aspart  0-15 Units Subcutaneous Q4H  . insulin aspart  2 Units Subcutaneous Q4H  . insulin glargine  12 Units Subcutaneous QHS  . lacosamide  150 mg Per Tube BID  . levETIRAcetam  1,000 mg Per Tube BID  . mouth rinse  15 mL Mouth Rinse q12n4p  . phenytoin  150 mg Per Tube TID  . QUEtiapine  50 mg Per Tube QHS  . sennosides  5 mL Per Tube BID   Continuous Infusions: . sodium chloride Stopped (10/15/19 1834)  . cefTRIAXone (ROCEPHIN)  IV 2 g (11/03/19 1415)  . feeding supplement (OSMOLITE 1.5 CAL) 1,000 mL (11/03/19 1750)     LOS: 24 days   Time spent= 35 mins    Yuliana Vandrunen Arsenio Loader, MD [facility]  If 7PM-7AM, please contact night-coverage  11/04/2019, 1:53 PM

## 2019-11-04 NOTE — Progress Notes (Signed)
Inpatient Rehab Admissions Coordinator:   Met with pt at bedside to introduce self as I will be following pt for my colleague throughout the remainder of the week.  We discussed placement of PEG planned for Monday and process for requesting prior authorization from insurance.  Per handoff from Clemens Catholic, pt's brother returned call yesterday and informed her that they would like to try to obtain auth for CIR here at [facility], and plan to bring pt back to [place] after CIR stay.  There is more family available to provide assist, and they are able to private pay for 24/7 nursing or LTC at SNF if needed.  I discussed with Rexene Alberts, LCSW, today, too.    Note pt's insurance is [employer] of [place], so I will send info to open tomorrow.   Shann Medal, PT, DPT Admissions Coordinator [phone] 11/04/19  3:09 PM

## 2019-11-05 ENCOUNTER — Encounter: Payer: BC Managed Care – PPO | Admitting: Occupational Therapy

## 2019-11-05 ENCOUNTER — Encounter: Payer: BC Managed Care – PPO | Admitting: Speech Pathology

## 2019-11-05 LAB — GLUCOSE, CAPILLARY
Glucose-Capillary: 156 mg/dL — ABNORMAL HIGH (ref 70–99)
Glucose-Capillary: 141 mg/dL — ABNORMAL HIGH (ref 70–99)
Glucose-Capillary: 192 mg/dL — ABNORMAL HIGH (ref 70–99)
Glucose-Capillary: 235 mg/dL — ABNORMAL HIGH (ref 70–99)
Glucose-Capillary: 197 mg/dL — ABNORMAL HIGH (ref 70–99)
Glucose-Capillary: 133 mg/dL — ABNORMAL HIGH (ref 70–99)

## 2019-11-05 LAB — BASIC METABOLIC PANEL
Anion gap: 13 (ref 5–15)
BUN: 30 mg/dL — ABNORMAL HIGH (ref 6–20)
CO2: 25 mmol/L (ref 22–32)
Calcium: 9 mg/dL (ref 8.9–10.3)
Chloride: 97 mmol/L — ABNORMAL LOW (ref 98–111)
Creatinine, Ser: 1.22 mg/dL (ref 0.61–1.24)
GFR calc Af Amer: >60 mL/min (ref >60)
GFR calc non Af Amer: >60 mL/min (ref >60)
Glucose, Bld: 143 mg/dL — ABNORMAL HIGH (ref 70–99)
Potassium: 3.6 mmol/L (ref 3.5–5.1)
Sodium: 135 mmol/L (ref 135–145)

## 2019-11-05 LAB — CBC
HCT: 31.3 % — ABNORMAL LOW (ref 39.0–52.0)
Hemoglobin: 10.2 g/dL — ABNORMAL LOW (ref 13.0–17.0)
MCH: 29.7 pg (ref 26.0–34.0)
MCHC: 32.6 g/dL (ref 30.0–36.0)
MCV: 91 fL (ref 80.0–100.0)
Platelets: 443 10*3/uL — ABNORMAL HIGH (ref 150–400)
RBC: 3.44 MIL/uL — ABNORMAL LOW (ref 4.22–5.81)
RDW: 13.7 % (ref 11.5–15.5)
WBC: 8.3 10*3/uL (ref 4.0–10.5)
nRBC: 0 % (ref 0.0–0.2)

## 2019-11-05 LAB — MAGNESIUM: Magnesium: 2.2 mg/dL (ref 1.7–2.4)

## 2019-11-05 MED ORDER — POTASSIUM CHLORIDE 10 MEQ/100ML IV SOLN
10.0000 meq | INTRAVENOUS | Status: AC
  Administered 2019-11-05 (×2): 10 meq via INTRAVENOUS
  Filled 2019-11-05 (×2): qty 100

## 2019-11-05 NOTE — Progress Notes (Addendum)
MEDICATION RELATED CONSULT NOTE - FOLLOW UP   Pharmacy Consult for Phenytoin Indication: Seizures   No Known Allergies  Patient Measurements: Height: 6\' 1"  (185.4 cm) Weight: 73.1 kg (161 lb 2.5 oz) IBW/kg (Calculated) : 79.9  Vital Signs: Temp: 98.7 F (37.1 C) (09/02 1200) Temp Source: Oral (09/02 1200) BP: 157/69 (09/02 1200) Pulse Rate: 82 (09/02 1200) Intake/Output from previous day: 09/01 0701 - 09/02 0700 In: 635 [NG/GT:635] Out: 800 [Urine:800] Intake/Output from this shift: Total I/O In: -  Out: 675 [Urine:675]  Labs: Recent Labs    11/03/19 0600 11/04/19 0723 11/05/19 0341  WBC 8.5 8.4 8.3  HGB 9.3* 9.9* 10.2*  HCT 29.4* 30.2* 31.3*  PLT 476* 449* 443*  CREATININE 1.22 1.27* 1.22  MG 2.1 2.0 2.2  PHOS 4.1 4.4  --   ALBUMIN 2.4* 2.6*  --   PROT 7.1 7.7  --   AST 82* 64*  --   ALT 90* 84*  --   ALKPHOS 165* 167*  --   BILITOT 0.5 0.4  --    Estimated Creatinine Clearance: 67.4 mL/min (by C-G formula based on SCr of 1.22 mg/dL).    Assessment: 59 yo M admitted with seizures. Last seizure noted 8/14  - 8/7 loaded with DPH (goal 15-20) and Keppra - 8/9 Keppra dose reduced d/t renal fxn 750mg  IV q12h > 8/13 incr back to 1000mg  q12h  8/7 DPH 1500mg  IV x1 then 100mg  Q8H (~4 mg/kg/d based on TBW; IBW 80 kg) 8/8 DPH level (post-load): 20.1 >> no adjustments, cont current dosing 8/11 DPH 14.1, albumin 4.1 8/12 DPH 13.4.  Vimpat and Onfi added. 8/13 DPH 12.6, Albumin 2.5, corrected 21. Vimpat and Keppra increased d/t improved CrCl 8/15 DPH 7.5 > corrected = 12.5, free level ~0.95 >> 150mg  x1 then resume 100 Q8hr, renal fx worsening 8/17 DPH 4.3 (Corrects to 9.35 based on albumin from 8/18) - continue current dose per MD  8/18: Phenytoin level 3 adjusts to 8.4 8/24: Switched from IV to PO  8/30: Phenytoin level 3.5 adjusted to 6 for albumin and renal function (goal 15-20)   Phenytoin level dropped significantly after switching to per tube  administration due to binding by continuous tube feeds. Would be difficult to hold tube feeds 2hr before and after each dose. Could switch to nocturnal feeds but would have to increase TF rate significantly to 180ml/hr to get the same nutrition. Dose increased, no reported seizures. Will follow levels closely   Goal of Therapy:  Phenytoin level 15-20 Seizure control  Plan:  -Continue phenytoin to 150mg  per tube TID, adjust to goal range of 15-20  -F/u phenytoin and albumin levels 9/4 AM  -Planned PEG placement 9/6, f/u if tube feeds are changed, will need to adjust phenytoin dose as well   9/18, PharmD, BCPS, BCCP Clinical Pharmacist  Please check AMION for all [facility] phone numbers After 10:00 PM, call Main Pharmacy [phone]

## 2019-11-05 NOTE — Progress Notes (Signed)
Occupational Therapy Note  Pt seen in conjunction with PT.  He was able to perform functional transfers with min A +2 progressing to mod A +2 as he fatigued.  He was able to perform simple grooming standing at sink with min A.  He is very motivated to improve and will benefit from CIR level rehab to allow him to maximize safety and independence with ADLs.  Will continue to follow.    11/05/19 1200  OT Visit Information  Last OT Received On 11/05/19  Assistance Needed +2  PT/OT/SLP Co-Evaluation/Treatment Yes  Reason for Co-Treatment Complexity of the patient's impairments (multi-system involvement);Necessary to address cognition/behavior during functional activity;For patient/therapist safety;To address functional/ADL transfers  OT goals addressed during session ADL's and self-care  History of Present Illness 59 y.o. 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  He was intubated for airway protection on 10/10/19. Pt extubated on 10/14/19.  on 8/14, pt had continued twitching of Rt UE with AMS, and decreased 02 sats.  He was intubated.  Diagnosis: status epilepticus in setting of prior large Lt MCA infarct; acute kidney injury, possible acute meningitis.  He underwent tracheostomy on 8/19/2.  PMH includes: poorly controlled DM, dysphagia, HTN  Precautions  Precautions Fall  Precaution Comments trach  Pain Assessment  Pain Assessment Faces  Faces Pain Scale 6  Pain Location Lt hand IV site - receiving Potassium   Pain Descriptors / Indicators Grimacing;Guarding;Restless  Pain Intervention(s) Premedicated before session;Other (comment) (RN notified )  Cognition  Arousal/Alertness Awake/alert  Behavior During Therapy Flat affect  Overall Cognitive Status Difficult to assess  General Comments Pt with h/o aphasia which makes cognition difficult to assess.  He is able to follow one step commands  consistently with gestural cues and in context of task   Difficult to assess due to Tracheostomy  Upper Extremity Assessment  Upper Extremity Assessment RUE deficits/detail  RUE Deficits / Details pt wtih no active movement, but able to use Rt UE as a stabilizer with max A    RUE Coordination decreased gross motor;decreased fine motor  Lower Extremity Assessment  Lower Extremity Assessment Defer to PT evaluation  ADL  Overall ADL's  Needs assistance/impaired  Eating/Feeding NPO  Grooming Wash/dry hands;Wash/dry face;Brushing hair;Minimal assistance;Standing  Grooming Details (indicate cue type and reason) assist for balance and to initiate task   Toilet Transfer Moderate assistance;+2 for physical assistance;+2 for safety/equipment;Ambulation;Comfort height toilet;RW  Functional mobility during ADLs Moderate assistance;+2 for physical assistance;+2 for safety/equipment  Bed Mobility  Overal bed mobility Needs Assistance  Bed Mobility Supine to Sit  Supine to sit Mod assist  General bed mobility comments pt attempts to use momentum to move into sitting.  Assist to lift trunk and to scoot hips to EOB   Balance  Overall balance assessment Needs assistance  Sitting-balance support Feet supported  Sitting balance-Leahy Scale Fair  Sitting balance - Comments able to maintain EOB sitting with min guard assist   Standing balance support During functional activity;Single extremity supported  Standing balance-Leahy Scale Poor  Standing balance comment requires min A for standing balance   Transfers  Overall transfer level Needs assistance  Equipment used Rolling walker (2 wheeled)  Transfers Sit to/from Stand;Stand Pivot Transfers  Sit to Stand +2 safety/equipment;Mod assist  Stand pivot transfers Mod assist;+2 physical assistance;+2 safety/equipment  General transfer comment Pt requires assist for forward translation of trunk and to  boost hips into standing, and for balance, as well as  assist to maneuver RW and for balance   General Comments  General comments (skin integrity, edema, etc.) VSS with pt on RA via trach   OT - End of Session  Equipment Utilized During Treatment Gait belt;Rolling walker  Activity Tolerance Patient tolerated treatment well  Patient left in chair;with call bell/phone within reach;with chair alarm set  Nurse Communication Mobility status  OT Assessment/Plan  OT Plan Discharge plan remains appropriate  OT Visit Diagnosis Hemiplegia and hemiparesis;Cognitive communication deficit (R41.841)  Symptoms and signs involving cognitive functions Cerebral infarction  Hemiplegia - Right/Left Right  Hemiplegia - dominant/non-dominant Dominant  Hemiplegia - caused by Cerebral infarction  OT Frequency (ACUTE ONLY) Min 2X/week  Recommendations for Other Services Rehab consult  Follow Up Recommendations Supervision/Assistance - 24 hour;CIR  OT Equipment None recommended by OT  AM-PAC OT "6 Clicks" Daily Activity Outcome Measure (Version 2)  Help from another person eating meals? 1  Help from another person taking care of personal grooming? 3  Help from another person toileting, which includes using toliet, bedpan, or urinal? 2  Help from another person bathing (including washing, rinsing, drying)? 2  Help from another person to put on and taking off regular upper body clothing? 2  Help from another person to put on and taking off regular lower body clothing? 2  6 Click Score 12  OT Goal Progression  Progress towards OT goals Progressing toward goals  OT Time Calculation  OT Start Time (ACUTE ONLY) 1100  OT Stop Time (ACUTE ONLY) 1140  OT Time Calculation (min) 40 min  OT General Charges  $OT Visit 1 Visit  OT Treatments  $Neuromuscular Re-education 23-37 mins  Eber Jones., OTR/L Acute Rehabilitation Services Pager [phone] Office [phone]

## 2019-11-05 NOTE — Progress Notes (Signed)
PROGRESS NOTE    Corey Bennett  SVX:[id number] DOB: Apr 03, 1960 DOA: 10/10/2019 PCP: Patient, No Pcp Per   Brief Narrative:   59 year old male with past medical history of 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 Patient is living in a assisted living facility and can ambulate by himself.  Due to concerns of seizure he was intubated for airway protection and taken to the ICU.  Eventually had tracheostomy placed on 8/23.  Since then he has had multiple swallowing evaluation which he continues to fail and progressing only very slowly.  Now the plan is to allow Plavix washout and place PEG tube on 11/09/2019.    Assessment & Plan:   Principal Problem:   Status epilepticus ([place]) Active Problems:   Carotid artery stenosis   Seizure (HCC)   CVA (cerebrovascular accident due to intracerebral hemorrhage) (HCC)/ left frontal lobe   AKI (acute kidney injury) ([place])   Essential hypertension   Type 2 diabetes mellitus with hyperlipidemia (HCC)   Chronic respiratory failure with hypoxemia requiring tracheostomy tube placement and mechanical ventilatory support. -Currently has tracheostomy in place.  Continue routine supportive care with frequent suctioning.  Advised to use incentive spirometer, Mucinex twice daily, bronchodilators scheduled twice daily and every 6 hours as needed. -Oral suctioning and routine care -Out of bed to chair.  Fever, improved likely in the setting of pneumonia with concern for aspiration, resolved -Concerns of possible aspiration by previous provider.  Procalcitonin levels are negative, antibiotics discontinued  Status epilepticus in the setting of recent large left MCA stroke -Continue Vimpat, Keppra and Dilantin  Essential hypertension -Currently on Norvasc 10 mg daily  Urinary Retention Foley catheter removed, closely monitor him.  On bethanechol  AKI,  improved Resolved.  Creatinine 1.2 around baseline  Diabetes type 2 with Hyperglycemia -Continue Accu-Cheks and sliding scale -NovoLog 2 units every 4 hours, Lantus 12 units at bedtime  Dysphagia.  After extensive efforts by speech and swallow therapy, current plans are to have PEG tube placed on 11/09/2019 after Plavix washout  Normocytic Anemia -Hemoglobin is currently stable at 9.9.   Hx of Left MCA stroke with residual aphasia and right hemiparesis  -Currently on aspirin daily, Lipitor 80 mg daily.  Plavix on hold until PEG tube placement  Right arm swelling  -Likely from superficial thrombosis, recommend warm compresses  Abnormal LFT's, not worsening -In the setting of acute illness, continue to monitor    DVT prophylaxis: Lovenox Code Status: Full code Family Communication: None  Status is: Inpatient  Remains inpatient appropriate because:Hemodynamically unstable   Dispo: The patient is from: SNF              Anticipated d/c is to: SNF              Anticipated d/c date is: > 3 days              Patient currently is not medically stable to d/c.  Currently on tube feeds, allowing for Plavix washout so PEG tube can be placed on 11/09/2019.  Maintain hospital stay:    Body mass index is 21.26 kg/m.     Subjective: No new complaints, still having thick white secretions but able to suction it.  No evidence of tube feeds or aspiration at this time. Review of Systems Otherwise negative except as per HPI, including: General: Denies fever, chills, night sweats or unintended weight loss. Resp: Denies cough, wheezing, shortness of breath.  Cardiac: Denies chest pain, palpitations, orthopnea, paroxysmal nocturnal dyspnea. GI: Denies abdominal pain, nausea, vomiting, diarrhea or constipation GU: Denies dysuria, frequency, hesitancy or incontinence MS: Denies muscle aches, joint pain or swelling Neuro: Denies headache, neurologic deficits (focal weakness, numbness,  tingling), abnormal gait Psych: Denies anxiety, depression, SI/HI/AVH Skin: Denies new rashes or lesions ID: Denies sick contacts, exotic exposures, travel  Examination:  Constitutional: Chronically ill-appearing, feeding tube in place.  Overall having hard time communicating but follows all the commands and nods yes and no Respiratory: Diffuse coarse breath sounds Cardiovascular: Normal sinus rhythm, no rubs Abdomen: Nontender nondistended good bowel sounds Musculoskeletal: No edema noted Skin: No rashes seen Neurologic: CN 2-12 grossly intact.  And nonfocal Psychiatric: Normal judgment and insight. Alert and oriented x 3. Normal mood. Rectal tube in place  Objective: Vitals:   11/05/19 0410 11/05/19 0800 11/05/19 0835 11/05/19 1125  BP: (!) 158/67 136/68 136/68   Pulse: 83 83 78 88  Resp: 20 20 (!) 21 20  Temp: 98.9 F (37.2 C) 98.6 F (37 C)    TempSrc: Axillary Oral    SpO2: 100% 98% 98% 100%  Weight:      Height:        Intake/Output Summary (Last 24 hours) at 11/05/2019 1137 Last data filed at 11/04/2019 2200 Gross per 24 hour  Intake 635 ml  Output 800 ml  Net -165 ml   Filed Weights   11/03/19 0500 11/04/19 0348 11/05/19 0321  Weight: 77.9 kg 79.2 kg 73.1 kg     Data Reviewed:   CBC: Recent Labs  Lab 10/31/19 0147 10/31/19 0147 11/01/19 0927 11/02/19 0129 11/03/19 0600 11/04/19 0723 11/05/19 0341  WBC 14.2*   < > 9.9 9.0 8.5 8.4 8.3  NEUTROABS 11.7*  --  7.8* 6.1 5.7 6.0  --   HGB 8.7*   < > 8.9* 9.2* 9.3* 9.9* 10.2*  HCT 25.9*   < > 27.2* 28.1* 29.4* 30.2* 31.3*  MCV 91.8   < > 91.0 91.2 92.7 91.5 91.0  PLT 454*   < > 447* 473* 476* 449* 443*   < > = values in this interval not displayed.   Basic Metabolic Panel: Recent Labs  Lab 10/31/19 0147 10/31/19 0147 11/01/19 0927 11/02/19 0129 11/03/19 0600 11/04/19 0723 11/05/19 0341  NA 137   < > 135 137 136 137 135  K 3.6   < > 3.8 3.5 4.2 3.5 3.6  CL 99   < > 98 98 99 98 97*  CO2 26   < >  _0 GLUCOSE 222*   < > 263* 168* 237* 177* 143*  BUN 19   < > 22* 22* 25* 26* 30*  CREATININE 1.26*   < > 1.23 1.20 1.22 1.27* 1.22  CALCIUM 8.7*   < > 8.6* 8.8* 8.6* 8.8* 9.0  MG 1.8   < > 1.8 1.9 2.1 2.0 2.2  PHOS 4.1  --  3.7 4.2 4.1 4.4  --    < > = values in this interval not displayed.   GFR: Estimated Creatinine Clearance: 67.4 mL/min (by C-G formula based on SCr of 1.22 mg/dL). Liver Function Tests: Recent Labs  Lab 10/31/19 0147 11/01/19 0927 11/02/19 0129 11/03/19 0600 11/04/19 0723  AST 80* 80* 83* 82* 64*  ALT 74* 85* 92* 90* 84*  ALKPHOS 148* 154* 177* 165* 167*  BILITOT 0.3 0.3 0.3 0.5 0.4  PROT 6.6 7.0 7.1 7.1 7.7  ALBUMIN 2.2* 2.3*  2.4* 2.4* 2.6*   No results for input(s): LIPASE, AMYLASE in the last 168 hours. No results for input(s): AMMONIA in the last 168 hours. Coagulation Profile: No results for input(s): INR, PROTIME in the last 168 hours. Cardiac Enzymes: No results for input(s): CKTOTAL, CKMB, CKMBINDEX, TROPONINI in the last 168 hours. BNP (last 3 results) No results for input(s): PROBNP in the last 8760 hours. HbA1C: No results for input(s): HGBA1C in the last 72 hours. CBG: Recent Labs  Lab 11/04/19 1615 11/04/19 2013 11/04/19 2339 11/05/19 0409 11/05/19 0803  GLUCAP 240* 255* 165* 156* 141*   Lipid Profile: No results for input(s): CHOL, HDL, LDLCALC, TRIG, CHOLHDL, LDLDIRECT in the last 72 hours. Thyroid Function Tests: No results for input(s): TSH, T4TOTAL, FREET4, T3FREE, THYROIDAB in the last 72 hours. Anemia Panel: No results for input(s): VITAMINB12, FOLATE, FERRITIN, TIBC, IRON, RETICCTPCT in the last 72 hours. Sepsis Labs: Recent Labs  Lab 10/30/19 1343 10/30/19 1645 10/31/19 0147 11/01/19 0128  PROCALCITON <0.10  --  <0.10 <0.10  LATICACIDVEN 1.2 1.2  --   --     Recent Results (from the past 240 hour(s))  Culture, blood (routine x 2)     Status: None   Collection Time: 10/30/19  1:43 PM   Specimen:  BLOOD  Result Value Ref Range Status   Specimen Description BLOOD LEFT ANTECUBITAL  Final   Special Requests   Final    BOTTLES DRAWN AEROBIC ONLY Blood Culture results may not be optimal due to an inadequate volume of blood received in culture bottles   Culture   Final    NO GROWTH 5 DAYS Performed at [facility], [place] [address] [place], [name] [address]    Report Status 11/04/2019 FINAL  Final  Culture, blood (routine x 2)     Status: None   Collection Time: 10/30/19  1:43 PM   Specimen: BLOOD  Result Value Ref Range Status   Specimen Description BLOOD BLOOD LEFT HAND  Final   Special Requests   Final    BOTTLES DRAWN AEROBIC AND ANAEROBIC Blood Culture adequate volume   Culture   Final    NO GROWTH 5 DAYS Performed at [facility], [place] [address] [place], [place] [address]    Report Status 11/04/2019 FINAL  Final  Culture, Urine     Status: None   Collection Time: 10/31/19 11:35 PM   Specimen: Urine, Catheterized  Result Value Ref Range Status   Specimen Description URINE, CATHETERIZED  Final   Special Requests NONE  Final   Culture   Final    NO GROWTH Performed at [facility], [address] [place], [place] [address]    Report Status 11/03/2019 FINAL  Final         Radiology Studies: MR BRAIN WO CONTRAST  Result Date: 11/04/2019 CLINICAL DATA:  Cranial neuropathy (9). 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 COMPARISON:  10/11/2019 FINDINGS: Brain: Large remote left MCA branch infarct also extending to the parasagittal left frontal lobe, with chronic blood products. T2 hyperintensity and swelling at the left basal ganglia and upper thalamus has subsided with volume loss, also considered post ischemic. No  acute infarct. 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 Vascular: Normal  flow voids Skull and upper cervical spine: Normal marrow signal. Sinuses/Orbits: Bilateral cataract resection. Mild patchy opacification of the sinuses, improved. Bilateral mastoid opacification with negative nasopharynx, progressed and potentially related to intubation. IMPRESSION: 1. 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 2. Large remote left frontal infarct. Signal abnormality and swelling in the left deep gray nuclei has subsided since 10/11/2019, now with volume loss. Electronically Signed   By: Monte Fantasia M.D.   On: 11/04/2019 07:54   DG CHEST PORT 1 VIEW  Result Date: 11/04/2019 CLINICAL DATA:  Shortness of breath. EXAM: PORTABLE CHEST 1 VIEW COMPARISON:  11/03/2019 FINDINGS: 0643 hours. Cardiopericardial silhouette is at upper limits of normal for size. Interstitial markings are diffusely coarsened with chronic features. Right greater than left basilar predominant hazy airspace disease is similar although aeration in the left base has improved in the interval. Tracheostomy tube again noted. A feeding tube passes into the stomach although the distal tip position is not included on the film. The visualized bony structures of the thorax show no acute abnormality. Telemetry leads overlie the chest. IMPRESSION: Interval improvement in left basilar aeration. Otherwise stable exam. Electronically Signed   By: Misty Stanley M.D.   On: 11/04/2019 08:51        Scheduled Meds:  amLODipine  10 mg Per Tube Daily   aspirin  81 mg Per Tube Daily   atorvastatin  80 mg Per Tube Daily   bethanechol  15 mg Per Tube TID   chlorhexidine gluconate (MEDLINE KIT)  15 mL Mouth Rinse BID   Chlorhexidine Gluconate Cloth  6 each Topical Daily   clopidogrel  75 mg Per Tube Daily   enoxaparin (LOVENOX) injection  40 mg Subcutaneous  Q24H   feeding supplement (PROSource TF)  45 mL Per Tube BID   Gerhardt's butt cream   Topical Daily   insulin aspart  0-15 Units Subcutaneous Q4H   insulin aspart  2 Units Subcutaneous Q4H   insulin glargine  12 Units Subcutaneous QHS   lacosamide  150 mg Per Tube BID   levETIRAcetam  1,000 mg Per Tube BID   mouth rinse  15 mL Mouth Rinse q12n4p   phenytoin  150 mg Per Tube TID   QUEtiapine  50 mg Per Tube QHS   sennosides  5 mL Per Tube BID   Continuous Infusions:  sodium chloride Stopped (10/15/19 1834)   feeding supplement (OSMOLITE 1.5 CAL) 55 mL/hr at 11/04/19 1722     LOS: 25 days   Time spent= 35 mins    Cal Gindlesperger Arsenio Loader, MD [facility]  If 7PM-7AM, please contact night-coverage  11/05/2019, 11:37 AM

## 2019-11-05 NOTE — Progress Notes (Signed)
Physical Therapy Treatment Patient Details Name: Corey Bennett MRN: [id number] DOB: 01-13-1961 Today's Date: 11/05/2019    History of Present Illness 59 y.o. 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  He was intubated for airway protection on 10/10/19. Pt extubated on 10/14/19.  on 8/14, pt had continued twitching of Rt UE with AMS, and decreased 02 sats.  He was intubated.  Diagnosis: status epilepticus in setting of prior large Lt MCA infarct; acute kidney injury, possible acute meningitis.  He underwent tracheostomy on 8/19/2.  PMH includes: poorly controlled DM, dysphagia, HTN    PT Comments    Patient continues to improve with gait--initially +2 min assist with RW, but as fatigued (after 40 ft) required +2 mod assist to maintain rt hand on RW, prevent RLE scissoring, and maintain balance. He was walking modified independent living in ALF after recent CVA. Goal is to return to that level of function.    Follow Up Recommendations  CIR     Equipment Recommendations  Wheelchair (measurements PT);Wheelchair cushion (measurements PT)    Recommendations for Other Services       Precautions / Restrictions Precautions Precautions: Fall Precaution Comments: trach    Mobility  Bed Mobility Overal bed mobility: Needs Assistance Bed Mobility: Supine to Sit     Supine to sit: Mod assist     General bed mobility comments: pt attempts to use momentum to move into sitting.  Assist to lift trunk and to scoot hips to EOB   Transfers Overall transfer level: Needs assistance Equipment used: Rolling walker (2 wheeled) Transfers: Sit to/from Stand Sit to Stand: +2 safety/equipment;Mod assist         General transfer comment: Pt requires assist for forward translation of trunk and to boost hips into standing, and for balance, as well as assist to maneuver RW and for balance    Ambulation/Gait Ambulation/Gait assistance: Mod assist;+2 physical assistance;Min assist;+2 safety/equipment Gait Distance (Feet): 60 Feet (20 ft initially in room; seated rest, donned mask for hall) Assistive device: Rolling walker (2 wheeled) (OT helped pt maintain grip with R hand) Gait Pattern/deviations: Step-through pattern;Decreased stride length;Trunk flexed;Ataxic;Scissoring;Narrow base of support Gait velocity: decr   General Gait Details: pt advancing RLE with occasional assist to prevent adduction/scissoring; pt would then maintain wider steps for several steps and then begin adducting again   Stairs             Wheelchair Mobility    Modified Rankin (Stroke Patients Only)       Balance Overall balance assessment: Needs assistance Sitting-balance support: Feet supported Sitting balance-Leahy Scale: Fair Sitting balance - Comments: able to maintain EOB sitting with min guard assist    Standing balance support: During functional activity;Single extremity supported Standing balance-Leahy Scale: Poor Standing balance comment: requires min A for standing balance; RLE in flexion with min wt-bearing in static stance                            Cognition Arousal/Alertness: Awake/alert Behavior During Therapy: Flat affect Overall Cognitive Status: Difficult to assess                                 General Comments: Pt with h/o aphasia which makes cognition difficult to assess.  He is able to follow  one step commands consistently with gestural cues and in context of task       Exercises      General Comments        Pertinent Vitals/Pain Pain Assessment: Faces Faces Pain Scale: Hurts even more Pain Location: Lt hand IV site - receiving Potassium  Pain Descriptors / Indicators: Grimacing;Guarding;Restless Pain Intervention(s): Limited activity within patient's tolerance;Other (comment) (RN in to decr rate of IV)    Home Living                       Prior Function            PT Goals (current goals can now be found in the care plan section) Acute Rehab PT Goals Patient Stated Goal: pt unable to state  Time For Goal Achievement: 11/16/19 Potential to Achieve Goals: Good Progress towards PT goals: Progressing toward goals    Frequency    Min 3X/week      PT Plan Current plan remains appropriate    Co-evaluation PT/OT/SLP Co-Evaluation/Treatment: Yes Reason for Co-Treatment: Complexity of the patient's impairments (multi-system involvement);For patient/therapist safety PT goals addressed during session: Mobility/safety with mobility;Balance;Proper use of DME        AM-PAC PT "6 Clicks" Mobility   Outcome Measure  Help needed turning from your back to your side while in a flat bed without using bedrails?: A Little Help needed moving from lying on your back to sitting on the side of a flat bed without using bedrails?: A Lot Help needed moving to and from a bed to a chair (including a wheelchair)?: A Lot Help needed standing up from a chair using your arms (e.g., wheelchair or bedside chair)?: A Lot Help needed to walk in hospital room?: A Lot Help needed climbing 3-5 steps with a railing? : Total 6 Click Score: 12    End of Session Equipment Utilized During Treatment: Gait belt Activity Tolerance: Patient tolerated treatment well Patient left: in chair;with call bell/phone within reach;with chair alarm set Nurse Communication: Mobility status;Other (comment) (pain in IV site left hand) PT Visit Diagnosis: Other abnormalities of gait and mobility (R26.89);Other symptoms and signs involving the nervous system (R29.898);Muscle weakness (generalized) (M62.81);Unsteadiness on feet (R26.81);Difficulty in walking, not elsewhere classified (R26.2) Hemiplegia - Right/Left: Right Hemiplegia - caused by: Cerebral infarction     Time: 1100-1138 PT Time Calculation (min) (ACUTE ONLY): 38  min  Charges:  $Gait Training: 8-22 mins                      Jerolyn Center, PT Pager [phone]    Zena Amos 11/05/2019, 6:02 PM

## 2019-11-06 LAB — GLUCOSE, CAPILLARY
Glucose-Capillary: 155 mg/dL — ABNORMAL HIGH (ref 70–99)
Glucose-Capillary: 174 mg/dL — ABNORMAL HIGH (ref 70–99)
Glucose-Capillary: 181 mg/dL — ABNORMAL HIGH (ref 70–99)
Glucose-Capillary: 185 mg/dL — ABNORMAL HIGH (ref 70–99)
Glucose-Capillary: 198 mg/dL — ABNORMAL HIGH (ref 70–99)
Glucose-Capillary: 214 mg/dL — ABNORMAL HIGH (ref 70–99)

## 2019-11-06 LAB — CBC
HCT: 28.6 % — ABNORMAL LOW (ref 39.0–52.0)
Hemoglobin: 9.2 g/dL — ABNORMAL LOW (ref 13.0–17.0)
MCH: 29.4 pg (ref 26.0–34.0)
MCHC: 32.2 g/dL (ref 30.0–36.0)
MCV: 91.4 fL (ref 80.0–100.0)
Platelets: 421 10*3/uL — ABNORMAL HIGH (ref 150–400)
RBC: 3.13 MIL/uL — ABNORMAL LOW (ref 4.22–5.81)
RDW: 13.6 % (ref 11.5–15.5)
WBC: 7.4 10*3/uL (ref 4.0–10.5)
nRBC: 0 % (ref 0.0–0.2)

## 2019-11-06 LAB — BASIC METABOLIC PANEL
Anion gap: 13 (ref 5–15)
BUN: 28 mg/dL — ABNORMAL HIGH (ref 6–20)
CO2: 26 mmol/L (ref 22–32)
Calcium: 8.7 mg/dL — ABNORMAL LOW (ref 8.9–10.3)
Chloride: 97 mmol/L — ABNORMAL LOW (ref 98–111)
Creatinine, Ser: 1.3 mg/dL — ABNORMAL HIGH (ref 0.61–1.24)
GFR calc Af Amer: >60 mL/min (ref >60)
GFR calc non Af Amer: 60 mL/min — ABNORMAL LOW (ref >60)
Glucose, Bld: 213 mg/dL — ABNORMAL HIGH (ref 70–99)
Potassium: 3.7 mmol/L (ref 3.5–5.1)
Sodium: 136 mmol/L (ref 135–145)

## 2019-11-06 LAB — MAGNESIUM: Magnesium: 2.1 mg/dL (ref 1.7–2.4)

## 2019-11-06 NOTE — Progress Notes (Signed)
IR consulted by Dr. Marland Mcalpine for possible image-guided percutaneous gastrostomy tube placement.  IR was consulted for same procedure during this hospitalization (10/21/2019), however was cancelled prior to placement. IR has been re-consulted for this. Patient was seen/consented for procedure 10/21/2019- consent signed and in IR control room. Plan for possible image-guided percutaneous gastrostomy tube placement in IR tentatively for Tuesday 11/10/2019 pending IR scheduling. LD Plavix 11/04/2019- please continue to hold until 24 hours post-procedure. Will hold Lovenox day prior to procedure. Patient will be NPO/hold tube feeds at midnight prior to procedure. INR 1/2 on 10/22/2019.  Please call IR with questions/concerns.   Corey Boga Cassadee Vanzandt, PA-C 11/06/2019, 2:42 PM

## 2019-11-06 NOTE — Progress Notes (Signed)
NAME:  Corey Bennett, MRN:  [id number], DOB:  09-21-1960, LOS: 26 ADMISSION DATE:  10/10/2019, CONSULTATION DATE:  10/17/2019 REFERRING MD:  Ella Jubilee, CHIEF COMPLAINT:  Seizure , possible aspiration   Brief History   59 year old male with past medical history of 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 Patient is living in a assisted living facility and can ambulate by himself. Patient was found by staff to have a right sided twitching with right eye deviation. He was given Versed and Dilantin and was intubated for airway protection.  Patient was extubated on 8/11 and his seizures was controlled with Keppra, phenytoin, and Vimpat.  He was transferred to 3W. for medical management.  On 8/14, patient continued to have seizure with twitching of the right upper extremity.  2 mg of lorazepam was given.  Patient continued to desat in the 80s on 15 L of nonrebreather mask with altered mental status.  Patient was intubated to protect airway and transferred to the ICU.  Past Medical History  Left MCA stroke with residual aphasia and right hemiparesis CEA Hypertension Hypercholesterolemia Diabetes   Significant Hospital Events   8/08intubated in ED 8/11 extubation 8/14 reintubation 8/21 transfusion 1 unit PRBC 8/26 to [facility]  8/30 PCCM asked regarding appropriateness of downsize of trach  Consults:  Neurology Speech Pathology   Procedures:  8/8 ETT 8/14 ETT 08/19 Tracheostomy  8/23 PEG tube  Significant Diagnostic Tests:  CT head 8/8 >> No acute intracranial abnormality. Large area of encephalomalacia throughout the left MCA territory, consistent with remote infarct. UDS 8/8 >> + benzo (but given versed in ED); o/w negative MRI of brain8/8 >>large territory chronic hemorrhagic infarct in the left frontal lobe.Ill-defined hyperintensity throughout the left basal ganglia and left  thalamus with mild enhancement in dobutamine likely secondary to encephalitis, infiltrating tumor or atypical subacute infarction MRI of face and trigeminal region8/8 >>rules out soft tissue abscess CXR 8/17 >>improved aeration to the right lung base  CT Abd w/o contrast 8/17 >> cholelithiasis/trace ascites/aorthic atherosclerosis  Micro Data:  8/8 Sars-CoV-2 >> negative 8/8 BCx2 >> no growth 8/8 UC >>Klebsiella pneumonia 08/14 BCx >> negative 08/14 Trach Cx >> gram positive rods and cocci in pairs 08/17 Trach Cx >> negative   Antimicrobials:  Vancomycin 8/8 >> 8/12 Ceftriaxone 8/8 >> 8/12 Ampicillin 8/8 >> 8/11 Zosyn 8/14 >> 08/21 Vancomycin 08/14 >> 08/16  Interim history/subjective:  Re-evaluated for possible trach change.  Trach placed 8/19 Pt up to chair  On 5L / 28% ATC  Objective   Blood pressure (!) 166/76, pulse 81, temperature 98.2 F (36.8 C), temperature source Oral, resp. rate 20, height 6\' 1"  (1.854 m), weight 77 kg, SpO2 98 %.    FiO2 (%):  [28 %] 28 %   Intake/Output Summary (Last 24 hours) at 11/06/2019 1122 Last data filed at 11/06/2019 0746 Gross per 24 hour  Intake 100 ml  Output 1150 ml  Net -1050 ml   Filed Weights   11/04/19 0348 11/05/19 0321 11/06/19 0431  Weight: 79.2 kg 73.1 kg 77 kg    Examination: GEN: thin man in NAD HEENT: trach in place with copious secretions CV: RRR, ext warm PULM: scattered rhonci no wheezing GI: Soft, +BS, cortrak in place EXT: no edema NEURO: R weakness PSYCH: RASS 0 SKIN: No rashes  9/2 labs benign CXR 9/1 looks okay  Assessment & Plan:   Chronic respiratory failure with hypoxemia requiring tracheostomy tube  placement, mechanical ventilatory support in setting of large LEFT MCA CVA with status epilepticus. -continue trach collar with O2 to support saturations >90% -pulmonary hygiene -mobilize, coughing / deep breathing  -tracheal suctioning PRN  - secretion burden precludes capping trials, would  continue trach and have OP f/u in our trach clinic 1-2 months post discharge (route message to Anders Simmonds when getting near DC time and he can arrange)  Dysphagia.  -SLP following -PEG deferred at patients request  -TF via Cortrak -aspiration precautions   Status epilepticus in the setting of recent large left MCA stroke Hypertension Urinary Retention Diabetes type 2 with hyperglycemia  -per primary team   Best practice:  Diet: TF Pain/Anxiety/Delirium protocol (if indicated): n/a VAP protocol (if indicated): n/a DVT prophylaxis: enoxaparin GI prophylaxis: PPI Glucose control: SSI/ Lantus Mobility: BR Code Status: Full Family Communication: per primary   Disposition: Should be okay for SNF after PEG, PCCM available as needed  Myrla Halsted MD PCCM

## 2019-11-06 NOTE — Plan of Care (Signed)
  Problem: Clinical Measurements: Goal: Respiratory complications will improve Outcome: Progressing   Problem: Activity: Goal: Risk for activity intolerance will decrease Outcome: Progressing   Problem: Coping: Goal: Level of anxiety will decrease Outcome: Progressing   Problem: Pain Managment: Goal: General experience of comfort will improve Outcome: Progressing   

## 2019-11-06 NOTE — Progress Notes (Addendum)
Inpatient Rehab Admissions Coordinator:   Pt still with copious secretions.  Note plan for PEG placement on Tuesday.  Insurance requested updated clinicals be faxed at that time to review for CIR prior auth, which I will do.    Estill Dooms, PT, DPT Admissions Coordinator [phone] 11/06/19  1:29 PM

## 2019-11-06 NOTE — Progress Notes (Signed)
PROGRESS NOTE    Corey Bennett  TIW:[id number] DOB: 01/11/1961 DOA: 10/10/2019 PCP: Patient, No Pcp Per   Brief Narrative:   59 year old male with past medical history of 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 Patient is living in a assisted living facility and can ambulate by himself.  Due to concerns of seizure he was intubated for airway protection and taken to the ICU.  Eventually had tracheostomy placed on 8/23.  Since then he has had multiple swallowing evaluation which he continues to fail and progressing only very slowly.  Now the plan is to allow Plavix washout and place PEG tube on 11/09/2019.    Assessment & Plan:   Principal Problem:   Status epilepticus ([place]) Active Problems:   Carotid artery stenosis   Seizure (HCC)   CVA (cerebrovascular accident due to intracerebral hemorrhage) (HCC)/ left frontal lobe   AKI (acute kidney injury) ([place])   Essential hypertension   Type 2 diabetes mellitus with hyperlipidemia (HCC)   Chronic respiratory failure with hypoxemia requiring tracheostomy tube placement and mechanical ventilatory support. -Currently has tracheostomy in place.  Continue routine supportive care with frequent suctioning.  Advised to use incentive spirometer, Mucinex twice daily, bronchodilators scheduled twice daily and every 6 hours as needed. -Oral suctioning and routine care -Out of bed to chair.  Fever, improved likely in the setting of pneumonia with concern for aspiration, resolved -Concerns of possible aspiration by previous provider.  Procalcitonin levels are negative, antibiotics discontinued  Status epilepticus in the setting of recent large left MCA stroke -Continue Vimpat, Keppra and Dilantin  Essential hypertension -Currently on Norvasc 10 mg daily  Urinary Retention Foley catheter removed, closely monitor him.  On bethanechol  AKI,  improved Resolved.  Creatinine 1.2 around baseline  Diabetes type 2 with Hyperglycemia -Continue Accu-Cheks and sliding scale -NovoLog 2 units every 4 hours, Lantus 12 units at bedtime  Dysphagia.  After extensive efforts by speech and swallow therapy, current plans are to have PEG tube placed on 11/09/2019 after Plavix washout  Normocytic Anemia -Hemoglobin is currently stable at 9.9.   Hx of Left MCA stroke with residual aphasia and right hemiparesis  -Currently on aspirin daily, Lipitor 80 mg daily.  Plavix on hold until PEG tube placement  Right arm swelling  -Likely from superficial thrombosis, recommend warm compresses  Abnormal LFT's, not worsening -In the setting of acute illness, continue to monitor    DVT prophylaxis: Lovenox Code Status: Full code Family Communication: Updated periodically but none today  Status is: Inpatient  Remains inpatient appropriate because:Hemodynamically unstable   Dispo: The patient is from: SNF              Anticipated d/c is to: SNF              Anticipated d/c date is: > 3 days              Patient currently is not medically stable to d/c.  Currently on tube feeds, allowing for Plavix washout so PEG tube can be placed on 11/09/2019.  Maintain hospital stay:    Body mass index is 22.4 kg/m.     Subjective: No new complaints continues to have whitish secretions.  Tolerating tube feeds for now.  Examination:  Constitutional: Chronically ill-appearing with trach in place, feeding tube in place Respiratory: Clear to auscultation bilaterally Cardiovascular: Normal sinus rhythm, no rubs Abdomen: Nontender nondistended good bowel sounds Musculoskeletal: No  edema noted Skin: No rashes seen Neurologic: CN 2-12 grossly intact.  And nonfocal Psychiatric: Normal judgment and insight. Alert and oriented x 3. Normal mood. Rectal tube in place  Objective: Vitals:   11/06/19 0148 11/06/19 0330 11/06/19 0431 11/06/19 0700  BP:   (!) 151/65  (!) 166/76  Pulse: 83 75  81  Resp: (!) 24 (!) 21  20  Temp:  98 F (36.7 C)  98.2 F (36.8 C)  TempSrc:  Axillary  Oral  SpO2: 97% 100%  98%  Weight:   77 kg   Height:        Intake/Output Summary (Last 24 hours) at 11/06/2019 1127 Last data filed at 11/06/2019 0746 Gross per 24 hour  Intake 100 ml  Output 1150 ml  Net -1050 ml   Filed Weights   11/04/19 0348 11/05/19 0321 11/06/19 0431  Weight: 79.2 kg 73.1 kg 77 kg     Data Reviewed:   CBC: Recent Labs  Lab 10/31/19 0147 10/31/19 0147 11/01/19 0927 11/02/19 0129 11/03/19 0600 11/04/19 0723 11/05/19 0341  WBC 14.2*   < > 9.9 9.0 8.5 8.4 8.3  NEUTROABS 11.7*  --  7.8* 6.1 5.7 6.0  --   HGB 8.7*   < > 8.9* 9.2* 9.3* 9.9* 10.2*  HCT 25.9*   < > 27.2* 28.1* 29.4* 30.2* 31.3*  MCV 91.8   < > 91.0 91.2 92.7 91.5 91.0  PLT 454*   < > 447* 473* 476* 449* 443*   < > = values in this interval not displayed.   Basic Metabolic Panel: Recent Labs  Lab 10/31/19 0147 10/31/19 0147 11/01/19 0927 11/02/19 0129 11/03/19 0600 11/04/19 0723 11/05/19 0341  NA 137   < > 135 137 136 137 135  K 3.6   < > 3.8 3.5 4.2 3.5 3.6  CL 99   < > 98 98 99 98 97*  CO2 26   < > '26 27 23 26 25  ' GLUCOSE 222*   < > 263* 168* 237* 177* 143*  BUN 19   < > 22* 22* 25* 26* 30*  CREATININE 1.26*   < > 1.23 1.20 1.22 1.27* 1.22  CALCIUM 8.7*   < > 8.6* 8.8* 8.6* 8.8* 9.0  MG 1.8   < > 1.8 1.9 2.1 2.0 2.2  PHOS 4.1  --  3.7 4.2 4.1 4.4  --    < > = values in this interval not displayed.   GFR: Estimated Creatinine Clearance: 71 mL/min (by C-G formula based on SCr of 1.22 mg/dL). Liver Function Tests: Recent Labs  Lab 10/31/19 0147 11/01/19 0927 11/02/19 0129 11/03/19 0600 11/04/19 0723  AST 80* 80* 83* 82* 64*  ALT 74* 85* 92* 90* 84*  ALKPHOS 148* 154* 177* 165* 167*  BILITOT 0.3 0.3 0.3 0.5 0.4  PROT 6.6 7.0 7.1 7.1 7.7  ALBUMIN 2.2* 2.3* 2.4* 2.4* 2.6*   No results for input(s): LIPASE, AMYLASE in the last 168  hours. No results for input(s): AMMONIA in the last 168 hours. Coagulation Profile: No results for input(s): INR, PROTIME in the last 168 hours. Cardiac Enzymes: No results for input(s): CKTOTAL, CKMB, CKMBINDEX, TROPONINI in the last 168 hours. BNP (last 3 results) No results for input(s): PROBNP in the last 8760 hours. HbA1C: No results for input(s): HGBA1C in the last 72 hours. CBG: Recent Labs  Lab 11/05/19 1614 11/05/19 1922 11/05/19 2323 11/06/19 0328 11/06/19 0740  GLUCAP 235* 197* 133* 155* 181*   Lipid  Profile: No results for input(s): CHOL, HDL, LDLCALC, TRIG, CHOLHDL, LDLDIRECT in the last 72 hours. Thyroid Function Tests: No results for input(s): TSH, T4TOTAL, FREET4, T3FREE, THYROIDAB in the last 72 hours. Anemia Panel: No results for input(s): VITAMINB12, FOLATE, FERRITIN, TIBC, IRON, RETICCTPCT in the last 72 hours. Sepsis Labs: Recent Labs  Lab 10/30/19 1343 10/30/19 1645 10/31/19 0147 11/01/19 0128  PROCALCITON <0.10  --  <0.10 <0.10  LATICACIDVEN 1.2 1.2  --   --     Recent Results (from the past 240 hour(s))  Culture, blood (routine x 2)     Status: None   Collection Time: 10/30/19  1:43 PM   Specimen: BLOOD  Result Value Ref Range Status   Specimen Description BLOOD LEFT ANTECUBITAL  Final   Special Requests   Final    BOTTLES DRAWN AEROBIC ONLY Blood Culture results may not be optimal due to an inadequate volume of blood received in culture bottles   Culture   Final    NO GROWTH 5 DAYS Performed at [facility], [place] [address] [place], [place] [address]    Report Status 11/04/2019 FINAL  Final  Culture, blood (routine x 2)     Status: None   Collection Time: 10/30/19  1:43 PM   Specimen: BLOOD  Result Value Ref Range Status   Specimen Description BLOOD BLOOD LEFT HAND  Final   Special Requests   Final    BOTTLES DRAWN AEROBIC AND ANAEROBIC Blood Culture adequate volume   Culture   Final    NO GROWTH 5 DAYS Performed at [facility], [place] [address], [place], [place] [address]    Report Status 11/04/2019 FINAL  Final  Culture, Urine     Status: None   Collection Time: 10/31/19 11:35 PM   Specimen: Urine, Catheterized  Result Value Ref Range Status   Specimen Description URINE, CATHETERIZED  Final   Special Requests NONE  Final   Culture   Final    NO GROWTH Performed at [facility], [address], [place], [place] [address]    Report Status 11/03/2019 FINAL  Final         Radiology Studies: No results found.      Scheduled Meds:  amLODipine  10 mg Per Tube Daily   aspirin  81 mg Per Tube Daily   atorvastatin  80 mg Per Tube Daily   bethanechol  15 mg Per Tube TID   chlorhexidine gluconate (MEDLINE KIT)  15 mL Mouth Rinse BID   Chlorhexidine Gluconate Cloth  6 each Topical Daily   clopidogrel  75 mg Per Tube Daily   enoxaparin (LOVENOX) injection  40 mg Subcutaneous Q24H   feeding supplement (PROSource TF)  45 mL Per Tube BID   Gerhardt's butt cream   Topical Daily   insulin aspart  0-15 Units Subcutaneous Q4H   insulin aspart  2 Units Subcutaneous Q4H   insulin glargine  12 Units Subcutaneous QHS   lacosamide  150 mg Per Tube BID   levETIRAcetam  1,000 mg Per Tube BID   mouth rinse  15 mL Mouth Rinse q12n4p   phenytoin  150 mg Per Tube TID   QUEtiapine  50 mg Per Tube QHS   sennosides  5 mL Per Tube BID   Continuous Infusions:  sodium chloride Stopped (10/15/19 1834)   feeding supplement (OSMOLITE 1.5 CAL) 1,000 mL (11/06/19 1058)     LOS: 26 days   Time spent= 35  mins    Preciliano Castell Arsenio Loader, MD [facility]  If 7PM-7AM, please contact night-coverage  11/06/2019, 11:27 AM

## 2019-11-07 ENCOUNTER — Inpatient Hospital Stay (HOSPITAL_COMMUNITY): Payer: BC Managed Care – PPO

## 2019-11-07 LAB — CBC
HCT: 28.9 % — ABNORMAL LOW (ref 39.0–52.0)
Hemoglobin: 9.4 g/dL — ABNORMAL LOW (ref 13.0–17.0)
MCH: 29.5 pg (ref 26.0–34.0)
MCHC: 32.5 g/dL (ref 30.0–36.0)
MCV: 90.6 fL (ref 80.0–100.0)
Platelets: 381 10*3/uL (ref 150–400)
RBC: 3.19 MIL/uL — ABNORMAL LOW (ref 4.22–5.81)
RDW: 13.4 % (ref 11.5–15.5)
WBC: 8.9 10*3/uL (ref 4.0–10.5)
nRBC: 0 % (ref 0.0–0.2)

## 2019-11-07 LAB — BASIC METABOLIC PANEL
Anion gap: 8 (ref 5–15)
BUN: 28 mg/dL — ABNORMAL HIGH (ref 6–20)
CO2: 28 mmol/L (ref 22–32)
Calcium: 8.9 mg/dL (ref 8.9–10.3)
Chloride: 99 mmol/L (ref 98–111)
Creatinine, Ser: 1.17 mg/dL (ref 0.61–1.24)
GFR calc Af Amer: >60 mL/min (ref >60)
GFR calc non Af Amer: >60 mL/min (ref >60)
Glucose, Bld: 119 mg/dL — ABNORMAL HIGH (ref 70–99)
Potassium: 3.6 mmol/L (ref 3.5–5.1)
Sodium: 135 mmol/L (ref 135–145)

## 2019-11-07 LAB — PHENYTOIN LEVEL, TOTAL: Phenytoin Lvl: 3.8 ug/mL — ABNORMAL LOW (ref 10.0–20.0)

## 2019-11-07 LAB — GLUCOSE, CAPILLARY
Glucose-Capillary: 120 mg/dL — ABNORMAL HIGH (ref 70–99)
Glucose-Capillary: 175 mg/dL — ABNORMAL HIGH (ref 70–99)
Glucose-Capillary: 208 mg/dL — ABNORMAL HIGH (ref 70–99)
Glucose-Capillary: 214 mg/dL — ABNORMAL HIGH (ref 70–99)
Glucose-Capillary: 183 mg/dL — ABNORMAL HIGH (ref 70–99)
Glucose-Capillary: 155 mg/dL — ABNORMAL HIGH (ref 70–99)

## 2019-11-07 LAB — MAGNESIUM: Magnesium: 2 mg/dL (ref 1.7–2.4)

## 2019-11-07 LAB — ALBUMIN: Albumin: 2.7 g/dL — ABNORMAL LOW (ref 3.5–5.0)

## 2019-11-07 IMAGING — DX DG CHEST 1V PORT
1 series · 2 of 2 positions shown · non-contrast
Comparison: Radiograph [DATE]

CLINICAL DATA: Dyspnea.

EXAM:
PORTABLE CHEST 1 VIEW

[Series 1: chest · 0.14mm/px · 2 of 2 slices shown]
[im 1/2]
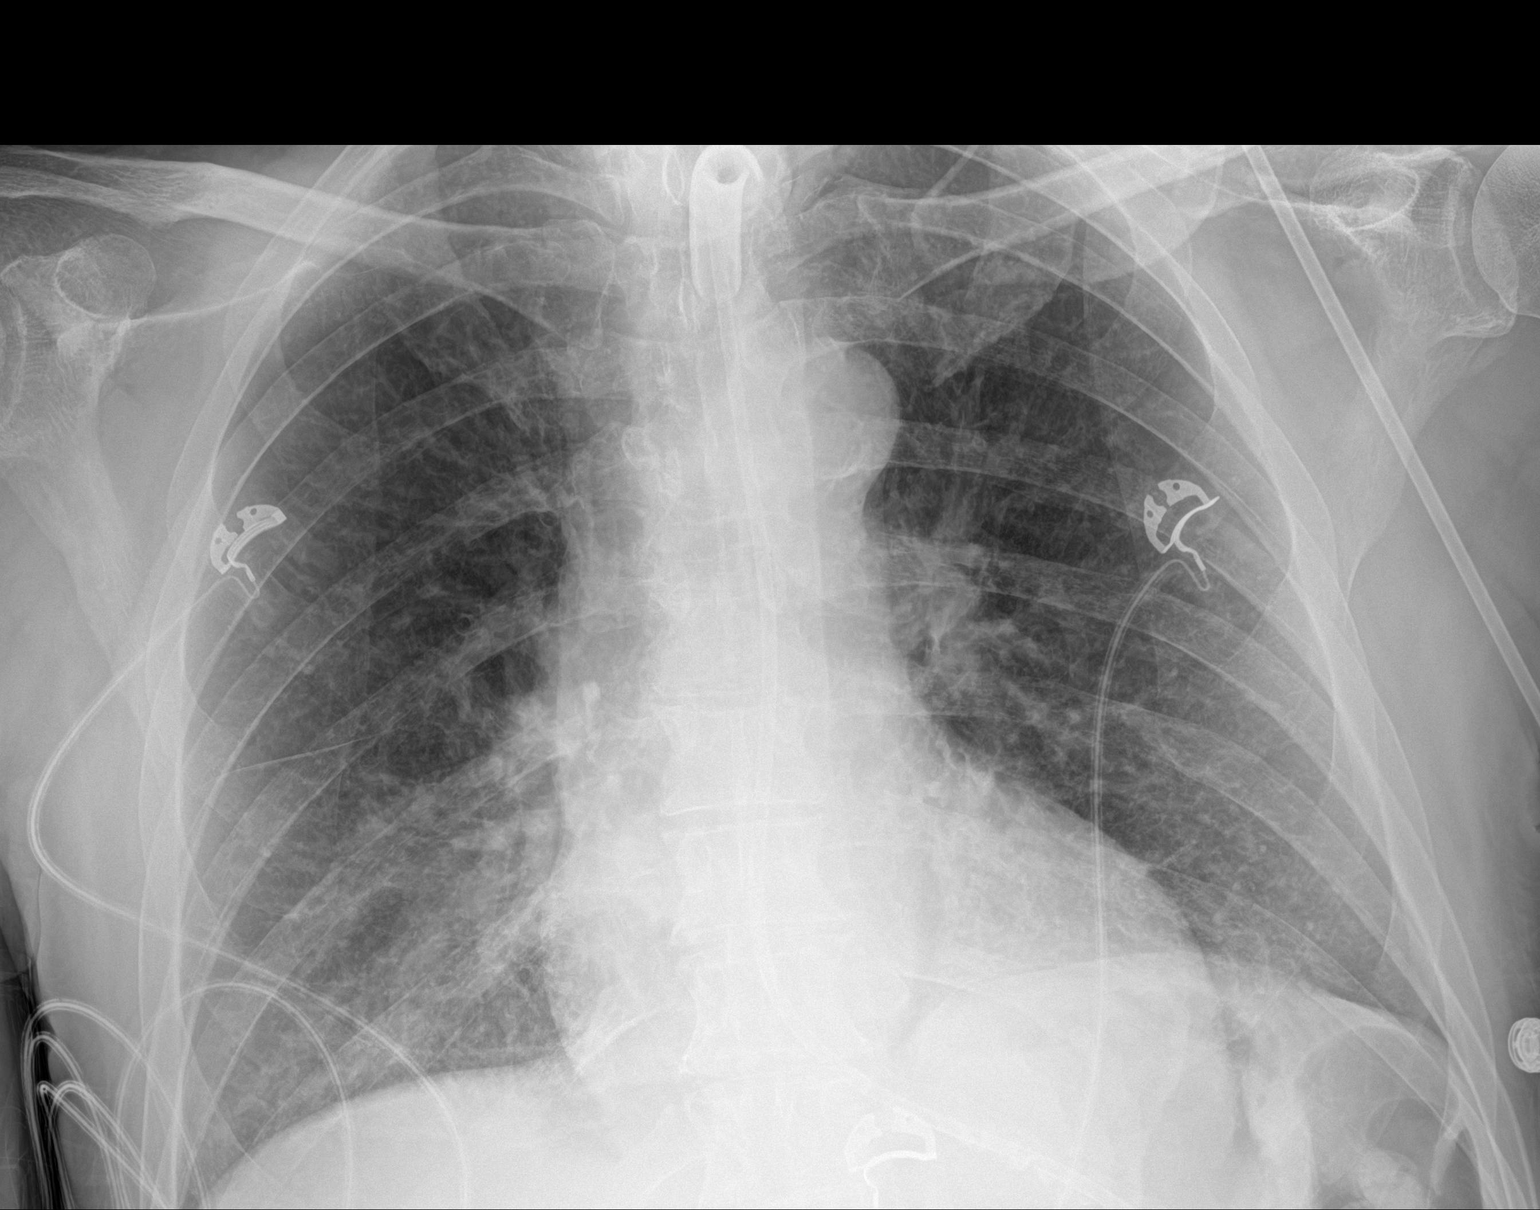
[im 2/2]
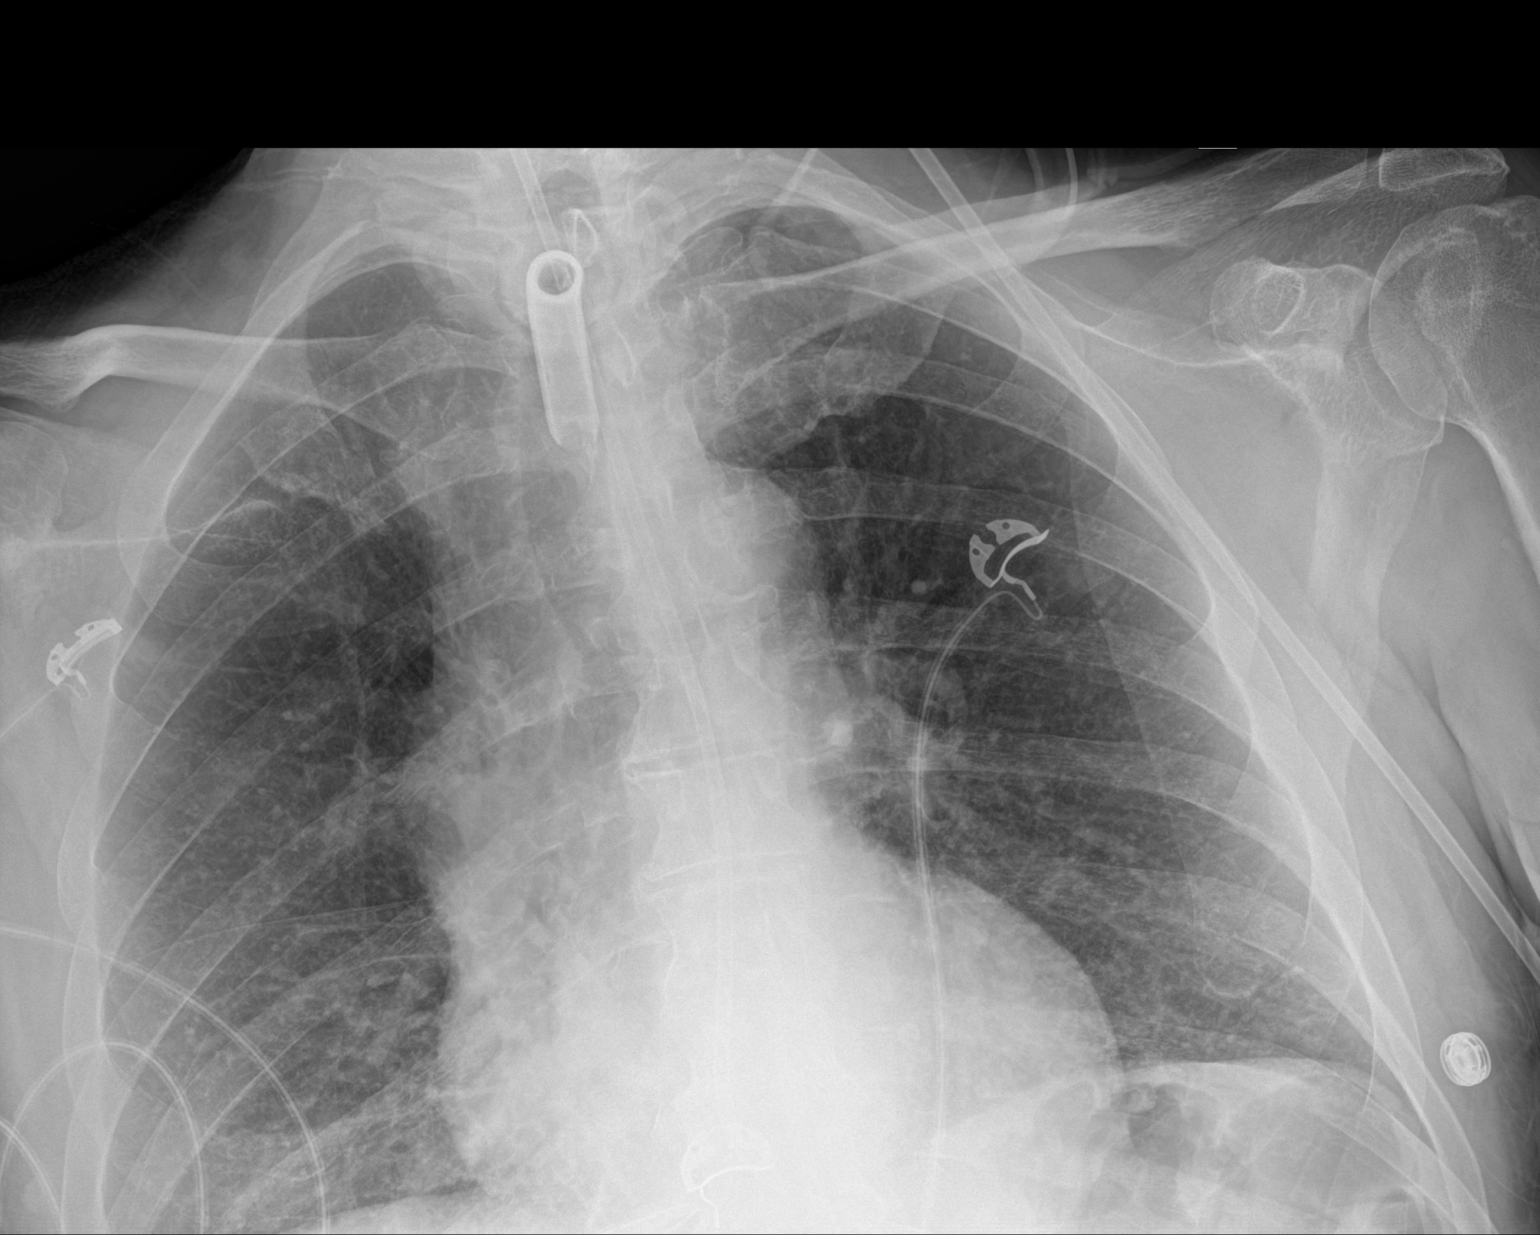

[2 of 2 positions shown; findings below may reference images not displayed]

FINDINGS: Tracheostomy tube tip at the thoracic inlet. Enteric tube in place
with tip below the diaphragm not included in the field of view. Hazy
airspace disease in both lung bases not significantly changed from
prior exam. Stable heart size and mediastinal contours. No
developing pulmonary edema. No pneumothorax or large pleural
effusion. Stable osseous structures.
IMPRESSION: 1. No change from exam 3 days ago. Unchanged hazy bibasilar airspace
disease.
2. Tracheostomy tube and enteric tube remain in place.

## 2019-11-07 MED ORDER — POTASSIUM CHLORIDE 20 MEQ PO PACK
40.0000 meq | PACK | Freq: Once | ORAL | Status: AC
  Administered 2019-11-07: 40 meq
  Filled 2019-11-07: qty 2

## 2019-11-07 MED ORDER — GUAIFENESIN 100 MG/5ML PO SOLN
5.0000 mL | ORAL | Status: DC | PRN
  Administered 2019-11-07 – 2019-11-21 (×12): 100 mg
  Filled 2019-11-07 (×12): qty 5

## 2019-11-07 MED ORDER — DOCUSATE SODIUM 50 MG/5ML PO LIQD
100.0000 mg | Freq: Two times a day (BID) | ORAL | Status: DC | PRN
  Administered 2019-11-14 – 2019-11-21 (×3): 100 mg
  Filled 2019-11-07 (×3): qty 10

## 2019-11-07 MED ORDER — PHENYTOIN 125 MG/5ML PO SUSP
175.0000 mg | Freq: Three times a day (TID) | ORAL | Status: DC
  Administered 2019-11-07 – 2019-11-10 (×10): 175 mg
  Filled 2019-11-07 (×12): qty 8

## 2019-11-07 MED ORDER — PHENYTOIN 125 MG/5ML PO SUSP
200.0000 mg | Freq: Three times a day (TID) | ORAL | Status: DC
  Administered 2019-11-07: 200 mg
  Filled 2019-11-07 (×3): qty 8

## 2019-11-07 NOTE — Progress Notes (Addendum)
MEDICATION RELATED CONSULT NOTE - FOLLOW UP   Pharmacy Consult for Phenytoin Indication: Seizures   No Known Allergies  Patient Measurements: Height: 6\' 1"  (185.4 cm) Weight: 73.8 kg (162 lb 11.2 oz) IBW/kg (Calculated) : 79.9  Vital Signs: Temp: 98.2 F (36.8 C) (09/04 0726) Temp Source: Oral (09/04 0726) BP: 159/74 (09/04 0726) Pulse Rate: 84 (09/04 0726) Intake/Output from previous day: 09/03 0701 - 09/04 0700 In: -  Out: 720 [Urine:620; Stool:100] Intake/Output from this shift: No intake/output data recorded.  Labs: Recent Labs    11/05/19 0341 11/06/19 1759 11/07/19 0314  WBC 8.3 7.4 8.9  HGB 10.2* 9.2* 9.4*  HCT 31.3* 28.6* 28.9*  PLT 443* 421* 381  CREATININE 1.22 1.30* 1.17  MG 2.2 2.1 2.0  ALBUMIN  --   --  2.7*   Estimated Creatinine Clearance: 71 mL/min (by C-G formula based on SCr of 1.17 mg/dL).    Assessment: 59 yo M admitted with seizures. Last seizure noted 8/14  - 8/7 loaded with DPH (goal 15-20) and Keppra - 8/9 Keppra dose reduced d/t renal fxn 750mg  IV q12h > 8/13 incr back to 1000mg  q12h  8/7 DPH 1500mg  IV x1 then 100mg  Q8H (~4 mg/kg/d based on TBW; IBW 80 kg) 8/8 DPH level (post-load): 20.1 >> no adjustments, cont current dosing 8/11 DPH 14.1, albumin 4.1 8/12 DPH 13.4.  Vimpat and Onfi added. 8/13 DPH 12.6, Albumin 2.5, corrected 21. Vimpat and Keppra increased d/t improved CrCl 8/15 DPH 7.5 > corrected = 12.5, free level ~0.95 >> 150mg  x1 then resume 100 Q8hr, renal fx worsening 8/17 DPH 4.3 (Corrects to 9.35 based on albumin from 8/18) - continue current dose per MD  8/18: Phenytoin level 3 adjusts to 8.4 8/24: Switched from IV to PO  8/30: Phenytoin level 3.5 adjusted to 6 for albumin and renal function (goal 15-20)  9/4: Phenytoin level 3.8 adjusted to 5.9 for albumin and renal function  Phenytoin level dropped significantly after switching to per tube administration due to binding by continuous tube feeds. Would be difficult to  hold tube feeds 2hr before and after each dose. Could switch to nocturnal feeds but would have to increase TF rate significantly to 178ml/hr to get the same nutrition.   9/4 update: Discussed with MD to increase dose vs switch back to IV.  Per MD, increase dose and recheck levels. Dose increased, no reported seizures.  Will follow levels  Goal of Therapy:  Phenytoin level 15-20 Seizure control  Plan:  -Increase phenytoin to 175 mg per tube TID (~7mg /kg/day based on protocol stating to inc TDD no more than 100mg /day), adjust to goal range of 15-20   -F/u phenytoin and albumin levels in 5-7 days (tentatively planned for 9/9) -Planned PEG placement 9/6, f/u if tube feeds are changed, will need to adjust phenytoin dose as well   9/30, PharmD PGY-1 Acute Care Pharmacy Resident Office: [phone] 11/07/2019 7:35 AM   Please check AMION for all [facility] Pharmacy phone numbers After 10:00 PM, call Main Pharmacy [phone]

## 2019-11-07 NOTE — Progress Notes (Signed)
No distress noted. Tele reports pt had an ST elevation of 3.0 which is one of the highest ones they saw. MD paged to make aware.

## 2019-11-07 NOTE — Progress Notes (Addendum)
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 Patient is living in a assisted living facility and can ambulate by himself.  Due to concerns of seizure he was intubated for airway protection and taken to the ICU.  Eventually had tracheostomy placed on 8/23.  Since then he has had multiple swallowing evaluation which he continues to fail and progressing only very slowly.  Now the plan is to allow Plavix washout and place PEG tube on 11/10/2019.    Assessment & Plan:   Principal Problem:   Status epilepticus ([place]) Active Problems:   Carotid artery stenosis   Seizure (HCC)   CVA (cerebrovascular accident due to intracerebral hemorrhage) (HCC)/ left frontal lobe   AKI (acute kidney injury) ([place])   Essential hypertension   Type 2 diabetes mellitus with hyperlipidemia (HCC)   Chronic respiratory failure with hypoxemia requiring tracheostomy tube placement and mechanical ventilatory support. -Currently has tracheostomy in place.  Continue routine supportive care with frequent suctioning.  Advised to use incentive spirometer, Mucinex twice daily, bronchodilators scheduled twice daily and every 6 hours as needed. -Oral suctioning and routine care -Out of bed to chair.  Fever, improved likely in the setting of pneumonia with concern for aspiration, resolved -Concerns of possible aspiration by previous provider.  Procalcitonin levels are negative, antibiotics discontinued  Status epilepticus in the setting of recent large left MCA stroke -Continue Vimpat, Keppra and Dilantin.  Dilantin level is low, increase today.  Advised pharmacy to recheck levels appropriately  Essential hypertension -Currently on Norvasc 10 mg  daily  Urinary Retention Foley catheter removed, closely monitor him.  On bethanechol  AKI, improved Resolved.  Creatinine 1.2 around baseline  Diabetes type 2 with Hyperglycemia -Continue Accu-Cheks and sliding scale -NovoLog 2 units every 4 hours, Lantus 12 units at bedtime  Dysphagia.  After extensive efforts by speech and swallow therapy, current plans are to have PEG tube placed on 9/7.  Aspirin Plavix held.  Normocytic Anemia -Hemoglobin is currently stable at 9.9.   Hx of Left MCA stroke with residual aphasia and right hemiparesis  -Aspirin Plavix on hold, Lipitor 80 mg daily.    Right arm swelling  -Likely from superficial thrombosis, recommend warm compresses  Abnormal LFT's, not worsening -In the setting of acute illness, continue to monitor    DVT prophylaxis: Lovenox Code Status: Full code Family Communication: Brother updated today  Status is: Inpatient  Remains inpatient appropriate because:Hemodynamically unstable   Dispo: The patient is from: SNF              Anticipated d/c is to: SNF              Anticipated d/c date is: > 3 days              Patient currently is not medically stable to d/c.  Currently on tube feeds, allowing for Plavix washout so PEG tube can be placed on 11/10/2019.  Maintain hospital stay:    Body mass index is 21.47 kg/m.     Subjective: Feels okay no complaints  Examination:  Constitutional: Appears chronically ill, tracheostomy in place.  Feeding tube in place. Respiratory: Mild anterior rhonchi Cardiovascular: Normal sinus rhythm, no rubs Abdomen: Nontender nondistended good bowel sounds Musculoskeletal: No edema noted Skin:  No rashes seen Neurologic: CN 2-12 grossly intact.  And nonfocal Psychiatric: Normal judgment and insight. Alert and oriented x 3. Normal mood. Rectal tube in place  Objective: Vitals:   11/07/19 0334 11/07/19 0456 11/07/19 0726 11/07/19 0840  BP: (!) 150/69  (!) 159/74   Pulse:  77  84   Resp: 19  (!) 23 (!) 23  Temp: 98.5 F (36.9 C)  98.2 F (36.8 C)   TempSrc: Oral  Oral   SpO2: 98%  96%   Weight:  73.8 kg    Height:        Intake/Output Summary (Last 24 hours) at 11/07/2019 0945 Last data filed at 11/07/2019 0452 Gross per 24 hour  Intake --  Output 620 ml  Net -620 ml   Filed Weights   11/05/19 0321 11/06/19 0431 11/07/19 0456  Weight: 73.1 kg 77 kg 73.8 kg     Data Reviewed:   CBC: Recent Labs  Lab 11/01/19 0927 11/01/19 0927 11/02/19 0129 11/02/19 0129 11/03/19 0600 11/04/19 0723 11/05/19 0341 11/06/19 1759 11/07/19 0314  WBC 9.9   < > 9.0   < > 8.5 8.4 8.3 7.4 8.9  NEUTROABS 7.8*  --  6.1  --  5.7 6.0  --   --   --   HGB 8.9*   < > 9.2*   < > 9.3* 9.9* 10.2* 9.2* 9.4*  HCT 27.2*   < > 28.1*   < > 29.4* 30.2* 31.3* 28.6* 28.9*  MCV 91.0   < > 91.2   < > 92.7 91.5 91.0 91.4 90.6  PLT 447*   < > 473*   < > 476* 449* 443* 421* 381   < > = values in this interval not displayed.   Basic Metabolic Panel: Recent Labs  Lab 11/01/19 0927 11/01/19 0927 11/02/19 0129 11/02/19 0129 11/03/19 0600 11/04/19 0723 11/05/19 0341 11/06/19 1759 11/07/19 0314  NA 135   < > 137   < > 136 137 135 136 135  K 3.8   < > 3.5   < > 4.2 3.5 3.6 3.7 3.6  CL 98   < > 98   < > 99 98 97* 97* 99  CO2 26   < > 27   < > '23 26 25 26 28  ' GLUCOSE 263*   < > 168*   < > 237* 177* 143* 213* 119*  BUN 22*   < > 22*   < > 25* 26* 30* 28* 28*  CREATININE 1.23   < > 1.20   < > 1.22 1.27* 1.22 1.30* 1.17  CALCIUM 8.6*   < > 8.8*   < > 8.6* 8.8* 9.0 8.7* 8.9  MG 1.8   < > 1.9   < > 2.1 2.0 2.2 2.1 2.0  PHOS 3.7  --  4.2  --  4.1 4.4  --   --   --    < > = values in this interval not displayed.   GFR: Estimated Creatinine Clearance: 71 mL/min (by C-G formula based on SCr of 1.17 mg/dL). Liver Function Tests: Recent Labs  Lab 11/01/19 0927 11/02/19 0129 11/03/19 0600 11/04/19 0723 11/07/19 0314  AST 80* 83* 82* 64*  --   ALT 85* 92* 90* 84*  --   ALKPHOS  154* 177* 165* 167*  --   BILITOT 0.3 0.3 0.5 0.4  --   PROT 7.0 7.1 7.1 7.7  --   ALBUMIN 2.3* 2.4* 2.4* 2.6* 2.7*  No results for input(s): LIPASE, AMYLASE in the last 168 hours. No results for input(s): AMMONIA in the last 168 hours. Coagulation Profile: No results for input(s): INR, PROTIME in the last 168 hours. Cardiac Enzymes: No results for input(s): CKTOTAL, CKMB, CKMBINDEX, TROPONINI in the last 168 hours. BNP (last 3 results) No results for input(s): PROBNP in the last 8760 hours. HbA1C: No results for input(s): HGBA1C in the last 72 hours. CBG: Recent Labs  Lab 11/06/19 1724 11/06/19 2044 11/06/19 2346 11/07/19 0332 11/07/19 0730  GLUCAP 198* 174* 185* 120* 175*   Lipid Profile: No results for input(s): CHOL, HDL, LDLCALC, TRIG, CHOLHDL, LDLDIRECT in the last 72 hours. Thyroid Function Tests: No results for input(s): TSH, T4TOTAL, FREET4, T3FREE, THYROIDAB in the last 72 hours. Anemia Panel: No results for input(s): VITAMINB12, FOLATE, FERRITIN, TIBC, IRON, RETICCTPCT in the last 72 hours. Sepsis Labs: Recent Labs  Lab 11/01/19 0128  PROCALCITON <0.10    Recent Results (from the past 240 hour(s))  Culture, blood (routine x 2)     Status: None   Collection Time: 10/30/19  1:43 PM   Specimen: BLOOD  Result Value Ref Range Status   Specimen Description BLOOD LEFT ANTECUBITAL  Final   Special Requests   Final    BOTTLES DRAWN AEROBIC ONLY Blood Culture results may not be optimal due to an inadequate volume of blood received in culture bottles   Culture   Final    NO GROWTH 5 DAYS Performed at [facility], [place] [address], [place], [place] [address]    Report Status 11/04/2019 FINAL  Final  Culture, blood (routine x 2)     Status: None   Collection Time: 10/30/19  1:43 PM   Specimen: BLOOD  Result Value Ref Range Status   Specimen Description BLOOD BLOOD LEFT HAND  Final   Special Requests   Final    BOTTLES DRAWN AEROBIC AND ANAEROBIC Blood  Culture adequate volume   Culture   Final    NO GROWTH 5 DAYS Performed at [facility], [place] [address], [place], [place] [address]    Report Status 11/04/2019 FINAL  Final  Culture, Urine     Status: None   Collection Time: 10/31/19 11:35 PM   Specimen: Urine, Catheterized  Result Value Ref Range Status   Specimen Description URINE, CATHETERIZED  Final   Special Requests NONE  Final   Culture   Final    NO GROWTH Performed at [facility], [address], [place], [place] [address]    Report Status 11/03/2019 FINAL  Final         Radiology Studies: No results found.      Scheduled Meds: . amLODipine  10 mg Per Tube Daily  . atorvastatin  80 mg Per Tube Daily  . bethanechol  15 mg Per Tube TID  . chlorhexidine gluconate (MEDLINE KIT)  15 mL Mouth Rinse BID  . Chlorhexidine Gluconate Cloth  6 each Topical Daily  . enoxaparin (LOVENOX) injection  40 mg Subcutaneous Q24H  . feeding supplement (PROSource TF)  45 mL Per Tube BID  . Gerhardt's butt cream   Topical Daily  . insulin aspart  0-15 Units Subcutaneous Q4H  . insulin aspart  2 Units Subcutaneous Q4H  . insulin glargine  12 Units Subcutaneous QHS  . lacosamide  150 mg Per Tube BID  . levETIRAcetam  1,000 mg Per Tube BID  . mouth rinse  15 mL Mouth Rinse q12n4p  .  phenytoin  200 mg Per Tube TID  . QUEtiapine  50 mg Per Tube QHS  . sennosides  5 mL Per Tube BID   Continuous Infusions: . sodium chloride Stopped (10/15/19 1834)  . feeding supplement (OSMOLITE 1.5 CAL) 1,000 mL (11/07/19 0913)     LOS: 27 days   Time spent= 35 mins    Jae Bruck Arsenio Loader, MD [facility]  If 7PM-7AM, please contact night-coverage  11/07/2019, 9:45 AM

## 2019-11-07 NOTE — Progress Notes (Signed)
O2 sats above 96% on 5L/28% trach collar. Pt was noted  getting little s.o.b.-exhibited by putting oxygen collar up to mouth. Pt confirmed. Trach suction with mild secretions noted. R.T. called. MD paged to make aware.

## 2019-11-08 LAB — CBC
HCT: 28.8 % — ABNORMAL LOW (ref 39.0–52.0)
Hemoglobin: 9.5 g/dL — ABNORMAL LOW (ref 13.0–17.0)
MCH: 30.3 pg (ref 26.0–34.0)
MCHC: 33 g/dL (ref 30.0–36.0)
MCV: 91.7 fL (ref 80.0–100.0)
Platelets: 376 10*3/uL (ref 150–400)
RBC: 3.14 MIL/uL — ABNORMAL LOW (ref 4.22–5.81)
RDW: 13.6 % (ref 11.5–15.5)
WBC: 7.6 10*3/uL (ref 4.0–10.5)
nRBC: 0 % (ref 0.0–0.2)

## 2019-11-08 LAB — MAGNESIUM: Magnesium: 2.1 mg/dL (ref 1.7–2.4)

## 2019-11-08 LAB — GLUCOSE, CAPILLARY
Glucose-Capillary: 181 mg/dL — ABNORMAL HIGH (ref 70–99)
Glucose-Capillary: 168 mg/dL — ABNORMAL HIGH (ref 70–99)
Glucose-Capillary: 165 mg/dL — ABNORMAL HIGH (ref 70–99)
Glucose-Capillary: 195 mg/dL — ABNORMAL HIGH (ref 70–99)
Glucose-Capillary: 222 mg/dL — ABNORMAL HIGH (ref 70–99)
Glucose-Capillary: 145 mg/dL — ABNORMAL HIGH (ref 70–99)

## 2019-11-08 LAB — BASIC METABOLIC PANEL
Anion gap: 8 (ref 5–15)
BUN: 29 mg/dL — ABNORMAL HIGH (ref 6–20)
CO2: 28 mmol/L (ref 22–32)
Calcium: 8.8 mg/dL — ABNORMAL LOW (ref 8.9–10.3)
Chloride: 102 mmol/L (ref 98–111)
Creatinine, Ser: 1.24 mg/dL (ref 0.61–1.24)
GFR calc Af Amer: >60 mL/min (ref >60)
GFR calc non Af Amer: >60 mL/min (ref >60)
Glucose, Bld: 195 mg/dL — ABNORMAL HIGH (ref 70–99)
Potassium: 3.8 mmol/L (ref 3.5–5.1)
Sodium: 138 mmol/L (ref 135–145)

## 2019-11-08 LAB — BRAIN NATRIURETIC PEPTIDE: B Natriuretic Peptide: 419.6 pg/mL — ABNORMAL HIGH (ref 0.0–100.0)

## 2019-11-08 MED ORDER — POTASSIUM CHLORIDE 20 MEQ PO PACK
20.0000 meq | PACK | Freq: Once | ORAL | Status: AC
  Administered 2019-11-08: 20 meq
  Filled 2019-11-08: qty 1

## 2019-11-08 MED ORDER — HYDRALAZINE HCL 25 MG PO TABS: 25.0000 mg | ORAL_TABLET | Freq: Three times a day (TID) | ORAL | Status: DC

## 2019-11-08 MED ORDER — HYDRALAZINE HCL 25 MG PO TABS
25.0000 mg | ORAL_TABLET | Freq: Three times a day (TID) | ORAL | Status: DC
  Administered 2019-11-08 – 2019-11-10 (×7): 25 mg
  Filled 2019-11-08 (×7): qty 1

## 2019-11-08 MED ORDER — FUROSEMIDE 10 MG/ML IJ SOLN
20.0000 mg | Freq: Three times a day (TID) | INTRAMUSCULAR | Status: AC
  Administered 2019-11-08 (×2): 20 mg via INTRAVENOUS
  Filled 2019-11-08 (×2): qty 4

## 2019-11-08 NOTE — Progress Notes (Signed)
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 Patient is living in a assisted living facility and can ambulate by himself.  Due to concerns of seizure he was intubated for airway protection and taken to the ICU.  Eventually had tracheostomy placed on 8/23.  Since then he has had multiple swallowing evaluation which he continues to fail and progressing only very slowly.  Now the plan is to allow Plavix washout and place PEG tube on 11/10/2019.    Assessment & Plan:   Principal Problem:   Status epilepticus ([place]) Active Problems:   Carotid artery stenosis   Seizure (HCC)   CVA (cerebrovascular accident due to intracerebral hemorrhage) (HCC)/ left frontal lobe   AKI (acute kidney injury) ([place])   Essential hypertension   Type 2 diabetes mellitus with hyperlipidemia (HCC)   Acute on chronic chronic respiratory failure with hypoxemia requiring tracheostomy tube placement and mechanical ventilatory support. -Currently has tracheostomy in place.  Continue routine supportive care with frequent suctioning.  Advised to use incentive spirometer, Mucinex twice daily, bronchodilators scheduled twice daily and every 6 hours as needed. -Oral suctioning and routine care -Out of bed to chair. -Elevated BNP, Lasix 20 mg IV X2 doses  Fever, improved likely in the setting of pneumonia with concern for aspiration, resolved -Concerns of possible aspiration by previous provider. Procalcitonin negative  Status epilepticus in the setting of recent large left MCA stroke -Continue Vimpat, Keppra and Dilantin. Low Dilantin levels therefore dose increased.  Advised pharmacy to recheck levels appropriately  Essential hypertension,  uncontrolled -Currently on Norvasc 10 mg daily -Hydralazine 25 mg 3 times daily added  Urinary Retention Foley catheter removed, closely monitor him.  On bethanechol  AKI, improved Resolved.  Creatinine 1.2 around baseline  Diabetes type 2 with Hyperglycemia -Continue Accu-Cheks and sliding scale -NovoLog 2 units every 4 hours, Lantus 12 units at bedtime  Dysphagia.  After extensive efforts by speech and swallow therapy, current plans are to have PEG tube placed on 9/7.  Aspirin Plavix held.  Normocytic Anemia -Hemoglobin is currently stable at 9.9.   Hx of Left MCA stroke with residual aphasia and right hemiparesis  -Aspirin Plavix on hold, Lipitor 80 mg daily.    Right arm swelling  -Likely from superficial thrombosis, recommend warm compresses  Abnormal LFT's, not worsening -In the setting of acute illness, continue to monitor  DVT prophylaxis: Lovenox Code Status: Full code Family Communication: Brother updated yesterday, he will be updated periodically  Status is: Inpatient  Remains inpatient appropriate because:Hemodynamically unstable   Dispo: The patient is from: SNF              Anticipated d/c is to: SNF              Anticipated d/c date is: > 3 days              Patient currently is not medically stable to d/c.  Currently on tube feeds, allowing for Plavix washout so PEG tube can be placed on 11/10/2019.  Maintain hospital stay:    Body mass index is 21.47 kg/m.     Subjective: Had episode of respiratory distress yesterday evening, chest x-ray was unremarkable. This morning does not have any complaints.  Examination:  Constitutional: Appears chronically  ill Respiratory: Diminished breath sounds bilaterally especially at bases. Rhonchi heard anteriorly as well Cardiovascular: Normal sinus rhythm, no rubs Abdomen: Nontender nondistended good bowel sounds Musculoskeletal: No edema noted Skin: No rashes seen Neurologic: CN 2-12 grossly  intact.  And nonfocal Psychiatric: Normal judgment and insight. Alert and oriented x 3. Normal mood. Rectal tube, feeding tube and tracheostomy in place  Objective: Vitals:   11/08/19 0742 11/08/19 0801 11/08/19 0853 11/08/19 0909  BP: (!) 159/75  (!) 169/73 (!) 169/73  Pulse: 79 81    Resp: (!) 24 (!) 24    Temp: 98.6 F (37 C)     TempSrc: Oral     SpO2: 97% 97%    Weight:      Height:        Intake/Output Summary (Last 24 hours) at 11/08/2019 0955 Last data filed at 11/08/2019 0500 Gross per 24 hour  Intake --  Output 630 ml  Net -630 ml   Filed Weights   11/05/19 0321 11/06/19 0431 11/07/19 0456  Weight: 73.1 kg 77 kg 73.8 kg     Data Reviewed:   CBC: Recent Labs  Lab 11/02/19 0129 11/02/19 0129 11/03/19 0600 11/03/19 0600 11/04/19 0723 11/05/19 0341 11/06/19 1759 11/07/19 0314 11/08/19 0411  WBC 9.0   < > 8.5   < > 8.4 8.3 7.4 8.9 7.6  NEUTROABS 6.1  --  5.7  --  6.0  --   --   --   --   HGB 9.2*   < > 9.3*   < > 9.9* 10.2* 9.2* 9.4* 9.5*  HCT 28.1*   < > 29.4*   < > 30.2* 31.3* 28.6* 28.9* 28.8*  MCV 91.2   < > 92.7   < > 91.5 91.0 91.4 90.6 91.7  PLT 473*   < > 476*   < > 449* 443* 421* 381 376   < > = values in this interval not displayed.   Basic Metabolic Panel: Recent Labs  Lab 11/02/19 0129 11/02/19 0129 11/03/19 0600 11/03/19 0600 11/04/19 0723 11/05/19 0341 11/06/19 1759 11/07/19 0314 11/08/19 0411  NA 137   < > 136   < > 137 135 136 135 138  K 3.5   < > 4.2   < > 3.5 3.6 3.7 3.6 3.8  CL 98   < > 99   < > 98 97* 97* 99 102  CO2 27   < > 23   < > '26 25 26 28 28  ' GLUCOSE 168*   < > 237*   < > 177* 143* 213* 119* 195*  BUN 22*   < > 25*   < > 26* 30* 28* 28* 29*  CREATININE 1.20   < > 1.22   < > 1.27* 1.22 1.30* 1.17 1.24  CALCIUM 8.8*   < > 8.6*   < > 8.8* 9.0 8.7* 8.9 8.8*  MG 1.9   < > 2.1   < > 2.0 2.2 2.1 2.0 2.1  PHOS 4.2  --  4.1  --  4.4  --   --   --   --    < > = values in this interval not displayed.   GFR: Estimated  Creatinine Clearance: 67 mL/min (by C-G formula based on SCr of 1.24 mg/dL). Liver Function Tests: Recent Labs  Lab 11/02/19 0129 11/03/19 0600 11/04/19 0723 11/07/19 0314  AST 83* 82* 64*  --   ALT 92* 90* 84*  --   ALKPHOS 177*  165* 167*  --   BILITOT 0.3 0.5 0.4  --   PROT 7.1 7.1 7.7  --   ALBUMIN 2.4* 2.4* 2.6* 2.7*   No results for input(s): LIPASE, AMYLASE in the last 168 hours. No results for input(s): AMMONIA in the last 168 hours. Coagulation Profile: No results for input(s): INR, PROTIME in the last 168 hours. Cardiac Enzymes: No results for input(s): CKTOTAL, CKMB, CKMBINDEX, TROPONINI in the last 168 hours. BNP (last 3 results) No results for input(s): PROBNP in the last 8760 hours. HbA1C: No results for input(s): HGBA1C in the last 72 hours. CBG: Recent Labs  Lab 11/07/19 1647 11/07/19 2024 11/07/19 2329 11/08/19 0356 11/08/19 0745  GLUCAP 214* 183* 155* 181* 168*   Lipid Profile: No results for input(s): CHOL, HDL, LDLCALC, TRIG, CHOLHDL, LDLDIRECT in the last 72 hours. Thyroid Function Tests: No results for input(s): TSH, T4TOTAL, FREET4, T3FREE, THYROIDAB in the last 72 hours. Anemia Panel: No results for input(s): VITAMINB12, FOLATE, FERRITIN, TIBC, IRON, RETICCTPCT in the last 72 hours. Sepsis Labs: No results for input(s): PROCALCITON, LATICACIDVEN in the last 168 hours.  Recent Results (from the past 240 hour(s))  Culture, blood (routine x 2)     Status: None   Collection Time: 10/30/19  1:43 PM   Specimen: BLOOD  Result Value Ref Range Status   Specimen Description BLOOD LEFT ANTECUBITAL  Final   Special Requests   Final    BOTTLES DRAWN AEROBIC ONLY Blood Culture results may not be optimal due to an inadequate volume of blood received in culture bottles   Culture   Final    NO GROWTH 5 DAYS Performed at [facility] [address], [place], [place] [address]    Report Status 11/04/2019 FINAL  Final  Culture, blood (routine x 2)      Status: None   Collection Time: 10/30/19  1:43 PM   Specimen: BLOOD  Result Value Ref Range Status   Specimen Description BLOOD BLOOD LEFT HAND  Final   Special Requests   Final    BOTTLES DRAWN AEROBIC AND ANAEROBIC Blood Culture adequate volume   Culture   Final    NO GROWTH 5 DAYS Performed at [facility], [place] [address], [place], [place] [address]    Report Status 11/04/2019 FINAL  Final  Culture, Urine     Status: None   Collection Time: 10/31/19 11:35 PM   Specimen: Urine, Catheterized  Result Value Ref Range Status   Specimen Description URINE, CATHETERIZED  Final   Special Requests NONE  Final   Culture   Final    NO GROWTH Performed at [facility], [address] [place], [place] [address]    Report Status 11/03/2019 FINAL  Final         Radiology Studies: DG Chest Port 1 View  Result Date: 11/07/2019 CLINICAL DATA:  Dyspnea. EXAM: PORTABLE CHEST 1 VIEW COMPARISON:  Radiograph 11/04/2019 FINDINGS: Tracheostomy tube tip at the thoracic inlet. Enteric tube in place with tip below the diaphragm not included in the field of view. Hazy airspace disease in both lung bases not significantly changed from prior exam. Stable heart size and mediastinal contours. No developing pulmonary edema. No pneumothorax or large pleural effusion. Stable osseous structures. IMPRESSION: 1. No change from exam 3 days ago. Unchanged hazy bibasilar airspace disease. 2. Tracheostomy tube and enteric tube remain in place. Electronically Signed   By: Keith Rake M.D.   On: 11/07/2019 17:39  Scheduled Meds: . amLODipine  10 mg Per Tube Daily  . atorvastatin  80 mg Per Tube Daily  . bethanechol  15 mg Per Tube TID  . chlorhexidine gluconate (MEDLINE KIT)  15 mL Mouth Rinse BID  . Chlorhexidine Gluconate Cloth  6 each Topical Daily  . enoxaparin (LOVENOX) injection  40 mg Subcutaneous Q24H  . feeding supplement (PROSource TF)  45 mL Per Tube BID  . furosemide  20 mg  Intravenous Q8H  . Gerhardt's butt cream   Topical Daily  . hydrALAZINE  25 mg Per Tube Q8H  . insulin aspart  0-15 Units Subcutaneous Q4H  . insulin aspart  2 Units Subcutaneous Q4H  . insulin glargine  12 Units Subcutaneous QHS  . lacosamide  150 mg Per Tube BID  . levETIRAcetam  1,000 mg Per Tube BID  . mouth rinse  15 mL Mouth Rinse q12n4p  . phenytoin  175 mg Per Tube TID  . QUEtiapine  50 mg Per Tube QHS  . sennosides  5 mL Per Tube BID   Continuous Infusions: . sodium chloride Stopped (10/15/19 1834)  . feeding supplement (OSMOLITE 1.5 CAL) 1,000 mL (11/08/19 0401)     LOS: 28 days   Time spent= 35 mins    Timera Windt Arsenio Loader, MD [facility]  If 7PM-7AM, please contact night-coverage  11/08/2019, 9:55 AM

## 2019-11-09 LAB — GLUCOSE, CAPILLARY
Glucose-Capillary: 121 mg/dL — ABNORMAL HIGH (ref 70–99)
Glucose-Capillary: 136 mg/dL — ABNORMAL HIGH (ref 70–99)
Glucose-Capillary: 156 mg/dL — ABNORMAL HIGH (ref 70–99)
Glucose-Capillary: 172 mg/dL — ABNORMAL HIGH (ref 70–99)
Glucose-Capillary: 219 mg/dL — ABNORMAL HIGH (ref 70–99)
Glucose-Capillary: 221 mg/dL — ABNORMAL HIGH (ref 70–99)

## 2019-11-09 LAB — BASIC METABOLIC PANEL
Anion gap: 12 (ref 5–15)
BUN: 32 mg/dL — ABNORMAL HIGH (ref 6–20)
CO2: 28 mmol/L (ref 22–32)
Calcium: 9.2 mg/dL (ref 8.9–10.3)
Chloride: 99 mmol/L (ref 98–111)
Creatinine, Ser: 1.29 mg/dL — ABNORMAL HIGH (ref 0.61–1.24)
GFR calc Af Amer: >60 mL/min (ref >60)
GFR calc non Af Amer: >60 mL/min (ref >60)
Glucose, Bld: 138 mg/dL — ABNORMAL HIGH (ref 70–99)
Potassium: 3.7 mmol/L (ref 3.5–5.1)
Sodium: 139 mmol/L (ref 135–145)

## 2019-11-09 LAB — CBC
HCT: 30.9 % — ABNORMAL LOW (ref 39.0–52.0)
Hemoglobin: 10.2 g/dL — ABNORMAL LOW (ref 13.0–17.0)
MCH: 30.1 pg (ref 26.0–34.0)
MCHC: 33 g/dL (ref 30.0–36.0)
MCV: 91.2 fL (ref 80.0–100.0)
Platelets: 414 10*3/uL — ABNORMAL HIGH (ref 150–400)
RBC: 3.39 MIL/uL — ABNORMAL LOW (ref 4.22–5.81)
RDW: 13.6 % (ref 11.5–15.5)
WBC: 9.6 10*3/uL (ref 4.0–10.5)
nRBC: 0 % (ref 0.0–0.2)

## 2019-11-09 LAB — MAGNESIUM: Magnesium: 2 mg/dL (ref 1.7–2.4)

## 2019-11-09 MED ORDER — CHLORHEXIDINE GLUCONATE 0.12 % MT SOLN
OROMUCOSAL | Status: AC
  Filled 2019-11-09: qty 15

## 2019-11-09 MED ORDER — CEFAZOLIN SODIUM-DEXTROSE 2-4 GM/100ML-% IV SOLN
2.0000 g | Freq: Once | INTRAVENOUS | Status: AC
  Administered 2019-11-09: 2 g via INTRAVENOUS
  Filled 2019-11-09: qty 100

## 2019-11-09 MED ORDER — SCOPOLAMINE 1 MG/3DAYS TD PT72
1.0000 | MEDICATED_PATCH | TRANSDERMAL | Status: DC
  Administered 2019-11-09 – 2019-11-18 (×4): 1.5 mg via TRANSDERMAL
  Filled 2019-11-09 (×5): qty 1

## 2019-11-09 NOTE — Progress Notes (Signed)
[name] became dislodged, respiratory notified and came to re-insert, V/S stable, no desaturation noted.

## 2019-11-09 NOTE — Progress Notes (Signed)
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 Patient is living in a assisted living facility and can ambulate by himself.  Due to concerns of seizure he was intubated for airway protection and taken to the ICU.  Eventually had tracheostomy placed on 8/23.  Since then he has had multiple swallowing evaluation which he continues to fail and progressing only very slowly.  Now the plan is to allow Plavix washout and place PEG tube on 11/10/2019.    Assessment & Plan:   Principal Problem:   Status epilepticus ([place]) Active Problems:   Carotid artery stenosis   Seizure (HCC)   CVA (cerebrovascular accident due to intracerebral hemorrhage) (HCC)/ left frontal lobe   AKI (acute kidney injury) ([place])   Essential hypertension   Type 2 diabetes mellitus with hyperlipidemia (HCC)   Acute on chronic chronic respiratory failure with hypoxemia requiring tracheostomy tube placement and mechanical ventilatory support. -Currently has tracheostomy in place.  Continue routine supportive care with frequent suctioning.  Advised to use incentive spirometer, Mucinex twice daily, bronchodilators scheduled twice daily and every 6 hours as needed. -Oral suctioning and routine care -Out of bed to chair. -Received 2 doses of IV Lasix on 9/5.  Appears better today.  Fever, improved likely in the setting of pneumonia with concern for aspiration, resolved -Concerns of possible aspiration by previous provider. Procalcitonin negative  Status epilepticus in the setting of recent large left MCA stroke -Continue Vimpat, Keppra and Dilantin. Low Dilantin levels therefore dose increased.  Advised pharmacy to recheck levels appropriately and adjust dosing  as needed.  Essential hypertension, uncontrolled -Currently on Norvasc 10 mg daily -Hydralazine 25 mg 3 times daily added  Urinary Retention Foley catheter removed, closely monitor him.  On bethanechol  AKI, improved Resolved.  Creatinine 1.2 around baseline  Diabetes type 2 with Hyperglycemia -Continue Accu-Cheks and sliding scale -NovoLog 2 units every 4 hours, Lantus 12 units at bedtime  Dysphagia.  After extensive efforts by speech and swallow therapy, current plans are to have PEG tube placed on 9/7.  Aspirin and Plavix has been held  Normocytic Anemia -Hemoglobin is currently stable at 9.9.   Hx of Left MCA stroke with residual aphasia and right hemiparesis  -Aspirin Plavix on hold, Lipitor 80 mg daily.    Right arm swelling  -Likely from superficial thrombosis, recommend warm compresses  Abnormal LFT's, not worsening -In the setting of acute illness, continue to monitor  DVT prophylaxis: Lovenox Code Status: Full code Family Communication: Brother has been updated periodically  Status is: Inpatient  Remains inpatient appropriate because:Hemodynamically unstable   Dispo: The patient is from: SNF              Anticipated d/c is to: SNF              Anticipated d/c date is: > 3 days              Patient currently is not medically stable to d/c.  Currently patient is on tube feeds.  Allowing Plavix washout for PEG tube placement, tentative plan for 11/10/2019.  Thereafter will likely require CIR  Body mass index is 21.64 kg/m.     Subjective: Sitting up in the chair, does not have any complaints  Examination:  Constitutional: Not in acute distress, feeding tube in place Respiratory: Bilateral rhonchi Cardiovascular: Normal sinus rhythm, no rubs Abdomen: Nontender nondistended good bowel sounds Musculoskeletal: No edema noted Skin: No rashes seen Neurologic: CN 2-12 grossly intact.  And nonfocal Psychiatric: Normal judgment and insight. Alert  and oriented x 3. Normal mood. Rectal tube, feeding tube and tracheostomy in place  Objective: Vitals:   11/09/19 0524 11/09/19 0802 11/09/19 0831 11/09/19 1115  BP:  (!) 173/84 (!) 174/78 (!) 174/78  Pulse:  80 76 79  Resp:  (!) '25 19 17  ' Temp: 98.1 F (36.7 C) 98.4 F (36.9 C)    TempSrc: Oral Oral    SpO2:  97% 99% 100%  Weight:      Height:        Intake/Output Summary (Last 24 hours) at 11/09/2019 1204 Last data filed at 11/09/2019 0800 Gross per 24 hour  Intake 550 ml  Output 2720 ml  Net -2170 ml   Filed Weights   11/06/19 0431 11/07/19 0456 11/09/19 0416  Weight: 77 kg 73.8 kg 74.4 kg     Data Reviewed:   CBC: Recent Labs  Lab 11/03/19 0600 11/03/19 0600 11/04/19 0723 11/04/19 0723 11/05/19 0341 11/06/19 1759 11/07/19 0314 11/08/19 0411 11/09/19 1019  WBC 8.5   < > 8.4   < > 8.3 7.4 8.9 7.6 9.6  NEUTROABS 5.7  --  6.0  --   --   --   --   --   --   HGB 9.3*   < > 9.9*   < > 10.2* 9.2* 9.4* 9.5* 10.2*  HCT 29.4*   < > 30.2*   < > 31.3* 28.6* 28.9* 28.8* 30.9*  MCV 92.7   < > 91.5   < > 91.0 91.4 90.6 91.7 91.2  PLT 476*   < > 449*   < > 443* 421* 381 376 414*   < > = values in this interval not displayed.   Basic Metabolic Panel: Recent Labs  Lab 11/03/19 0600 11/03/19 0600 11/04/19 0723 11/04/19 0723 11/05/19 0341 11/06/19 1759 11/07/19 0314 11/08/19 0411 11/09/19 1019  NA 136   < > 137   < > 135 136 135 138 139  K 4.2   < > 3.5   < > 3.6 3.7 3.6 3.8 3.7  CL 99   < > 98   < > 97* 97* 99 102 99  CO2 23   < > 26   < > '25 26 28 28 28  ' GLUCOSE 237*   < > 177*   < > 143* 213* 119* 195* 138*  BUN 25*   < > 26*   < > 30* 28* 28* 29* 32*  CREATININE 1.22   < > 1.27*   < > 1.22 1.30* 1.17 1.24 1.29*  CALCIUM 8.6*   < > 8.8*   < > 9.0 8.7* 8.9 8.8* 9.2  MG 2.1   < > 2.0   < > 2.2 2.1 2.0 2.1 2.0  PHOS 4.1  --  4.4  --   --   --   --   --   --    < > = values in this interval not displayed.   GFR: Estimated Creatinine Clearance: 64.9 mL/min (A)  (by C-G formula based on SCr of 1.29 mg/dL (H)). Liver Function Tests: Recent Labs  Lab 11/03/19 0600 11/04/19 0723 11/07/19 0314  AST 82* 64*  --   ALT 90* 84*  --  ALKPHOS 165* 167*  --   BILITOT 0.5 0.4  --   PROT 7.1 7.7  --   ALBUMIN 2.4* 2.6* 2.7*   No results for input(s): LIPASE, AMYLASE in the last 168 hours. No results for input(s): AMMONIA in the last 168 hours. Coagulation Profile: No results for input(s): INR, PROTIME in the last 168 hours. Cardiac Enzymes: No results for input(s): CKTOTAL, CKMB, CKMBINDEX, TROPONINI in the last 168 hours. BNP (last 3 results) No results for input(s): PROBNP in the last 8760 hours. HbA1C: No results for input(s): HGBA1C in the last 72 hours. CBG: Recent Labs  Lab 11/08/19 1615 11/08/19 1939 11/08/19 2318 11/09/19 0349 11/09/19 0805  GLUCAP 195* 222* 145* 219* 121*   Lipid Profile: No results for input(s): CHOL, HDL, LDLCALC, TRIG, CHOLHDL, LDLDIRECT in the last 72 hours. Thyroid Function Tests: No results for input(s): TSH, T4TOTAL, FREET4, T3FREE, THYROIDAB in the last 72 hours. Anemia Panel: No results for input(s): VITAMINB12, FOLATE, FERRITIN, TIBC, IRON, RETICCTPCT in the last 72 hours. Sepsis Labs: No results for input(s): PROCALCITON, LATICACIDVEN in the last 168 hours.  Recent Results (from the past 240 hour(s))  Culture, blood (routine x 2)     Status: None   Collection Time: 10/30/19  1:43 PM   Specimen: BLOOD  Result Value Ref Range Status   Specimen Description BLOOD LEFT ANTECUBITAL  Final   Special Requests   Final    BOTTLES DRAWN AEROBIC ONLY Blood Culture results may not be optimal due to an inadequate volume of blood received in culture bottles   Culture   Final    NO GROWTH 5 DAYS Performed at [facility] [address] [place], [place] [address]    Report Status 11/04/2019 FINAL  Final  Culture, blood (routine x 2)     Status: None   Collection Time: 10/30/19  1:43 PM   Specimen:  BLOOD  Result Value Ref Range Status   Specimen Description BLOOD BLOOD LEFT HAND  Final   Special Requests   Final    BOTTLES DRAWN AEROBIC AND ANAEROBIC Blood Culture adequate volume   Culture   Final    NO GROWTH 5 DAYS Performed at [facility], [place] [address] [place], [place] [address]    Report Status 11/04/2019 FINAL  Final  Culture, Urine     Status: None   Collection Time: 10/31/19 11:35 PM   Specimen: Urine, Catheterized  Result Value Ref Range Status   Specimen Description URINE, CATHETERIZED  Final   Special Requests NONE  Final   Culture   Final    NO GROWTH Performed at [facility], [address], [place], [place] [address]    Report Status 11/03/2019 FINAL  Final         Radiology Studies: DG Chest Port 1 View  Result Date: 11/07/2019 CLINICAL DATA:  Dyspnea. EXAM: PORTABLE CHEST 1 VIEW COMPARISON:  Radiograph 11/04/2019 FINDINGS: Tracheostomy tube tip at the thoracic inlet. Enteric tube in place with tip below the diaphragm not included in the field of view. Hazy airspace disease in both lung bases not significantly changed from prior exam. Stable heart size and mediastinal contours. No developing pulmonary edema. No pneumothorax or large pleural effusion. Stable osseous structures. IMPRESSION: 1. No change from exam 3 days ago. Unchanged hazy bibasilar airspace disease. 2. Tracheostomy tube and enteric tube remain in place. Electronically Signed   By: Keith Rake M.D.   On: 11/07/2019 17:39  Scheduled Meds: . amLODipine  10 mg Per Tube Daily  . atorvastatin  80 mg Per Tube Daily  . bethanechol  15 mg Per Tube TID  . chlorhexidine gluconate (MEDLINE KIT)  15 mL Mouth Rinse BID  . Chlorhexidine Gluconate Cloth  6 each Topical Daily  . feeding supplement (PROSource TF)  45 mL Per Tube BID  . Gerhardt's butt cream   Topical Daily  . hydrALAZINE  25 mg Per Tube Q8H  . insulin aspart  0-15 Units Subcutaneous Q4H  . insulin aspart  2  Units Subcutaneous Q4H  . insulin glargine  12 Units Subcutaneous QHS  . lacosamide  150 mg Per Tube BID  . levETIRAcetam  1,000 mg Per Tube BID  . mouth rinse  15 mL Mouth Rinse q12n4p  . phenytoin  175 mg Per Tube TID  . QUEtiapine  50 mg Per Tube QHS  . sennosides  5 mL Per Tube BID   Continuous Infusions: . sodium chloride Stopped (10/15/19 1834)  . feeding supplement (OSMOLITE 1.5 CAL) 1,000 mL (11/08/19 2230)     LOS: 29 days   Time spent= 35 mins    Jadelynn Boylan Arsenio Loader, MD [facility]  If 7PM-7AM, please contact night-coverage  11/09/2019, 12:04 PM

## 2019-11-09 NOTE — Progress Notes (Signed)
PT Cancellation Note  Patient Details Name: Corey Bennett MRN: [id number] DOB: [birth year]   Cancelled Treatment:    Reason Eval/Treat Not Completed: Fatigue/lethargy limiting ability to participate.  Pt fatigued, seemed a bit frustrated/irritated that I woke him from sleeping to participate in therapy.  When offered, he agreed that he would like for me to check back later so that he can rest right now.  PT will check back later as time allows.   Thanks,  Corinna Capra, PT, DPT  Acute Rehabilitation [phone] pager #[phone] office       Lurena Joiner B Taylore Hinde 11/09/2019, 12:26 PM

## 2019-11-09 NOTE — Progress Notes (Signed)
Inpatient Rehab Admissions Coordinator:   I spoke with pt. At bedside regarding potential CIR admit. Plan is for PEG placement tomorrow, Pt. Should be appropriate for CIR admit after that. His insurance is requesting updated clinicals for authorization, I will send those.   Megan Salon, MS, CCC-SLP Rehab Admissions Coordinator  [phone] (celll) [phone] (office)

## 2019-11-10 ENCOUNTER — Inpatient Hospital Stay (HOSPITAL_COMMUNITY): Payer: BC Managed Care – PPO

## 2019-11-10 HISTORY — PX: IR GASTROSTOMY TUBE MOD SED: IMG625

## 2019-11-10 LAB — BASIC METABOLIC PANEL
Anion gap: 12 (ref 5–15)
BUN: 32 mg/dL — ABNORMAL HIGH (ref 6–20)
CO2: 27 mmol/L (ref 22–32)
Calcium: 9.5 mg/dL (ref 8.9–10.3)
Chloride: 101 mmol/L (ref 98–111)
Creatinine, Ser: 1.27 mg/dL — ABNORMAL HIGH (ref 0.61–1.24)
GFR calc Af Amer: >60 mL/min (ref >60)
GFR calc non Af Amer: >60 mL/min (ref >60)
Glucose, Bld: 178 mg/dL — ABNORMAL HIGH (ref 70–99)
Potassium: 3.6 mmol/L (ref 3.5–5.1)
Sodium: 140 mmol/L (ref 135–145)

## 2019-11-10 LAB — GLUCOSE, CAPILLARY
Glucose-Capillary: 123 mg/dL — ABNORMAL HIGH (ref 70–99)
Glucose-Capillary: 109 mg/dL — ABNORMAL HIGH (ref 70–99)
Glucose-Capillary: 177 mg/dL — ABNORMAL HIGH (ref 70–99)
Glucose-Capillary: 193 mg/dL — ABNORMAL HIGH (ref 70–99)
Glucose-Capillary: 187 mg/dL — ABNORMAL HIGH (ref 70–99)

## 2019-11-10 LAB — CBC
HCT: 31.2 % — ABNORMAL LOW (ref 39.0–52.0)
Hemoglobin: 10 g/dL — ABNORMAL LOW (ref 13.0–17.0)
MCH: 29.1 pg (ref 26.0–34.0)
MCHC: 32.1 g/dL (ref 30.0–36.0)
MCV: 90.7 fL (ref 80.0–100.0)
Platelets: 398 10*3/uL (ref 150–400)
RBC: 3.44 MIL/uL — ABNORMAL LOW (ref 4.22–5.81)
RDW: 13.4 % (ref 11.5–15.5)
WBC: 12.5 10*3/uL — ABNORMAL HIGH (ref 4.0–10.5)
nRBC: 0 % (ref 0.0–0.2)

## 2019-11-10 LAB — MAGNESIUM: Magnesium: 2 mg/dL (ref 1.7–2.4)

## 2019-11-10 IMAGING — XA IR PERC PLACEMENT GASTROSTOMY
1 series · 1 of 1 positions shown · non-contrast
Comparison: none

INDICATION: 59-year-old male with dysphagia referred for image guided
percutaneous gastrostomy

[Series 1: fl (-) angio · 1 of 1 slices shown]
[im 1/1]
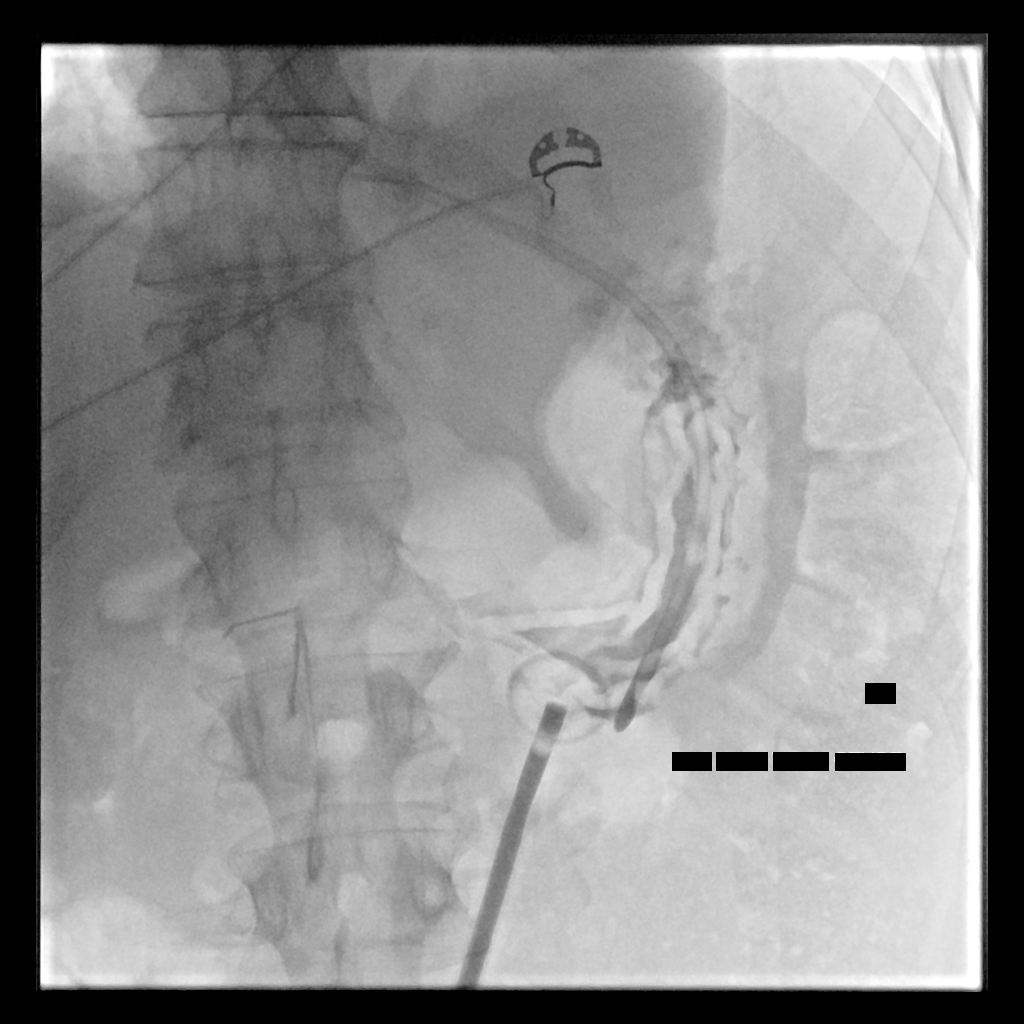

[1 of 1 positions shown; findings below may reference images not displayed]

EXAM:
PERC PLACEMENT GASTROSTOMY

MEDICATIONS:
2 g Ancef; Antibiotics were administered within 1 hour of the
procedure. Scratch

ANESTHESIA/SEDATION:
Versed 1.0 mg IV; Fentanyl 50 mcg IV

Moderate Sedation Time:  19 minutes

The patient was continuously monitored during the procedure by the
interventional radiology nurse under my direct supervision.

CONTRAST:  10 cc into the lumen of the stomach

FLUOROSCOPY TIME:  Fluoroscopy Time: 4 minutes 30 seconds (7 mGy).

COMPLICATIONS:
None

PROCEDURE:
Informed written consent was obtained from the patient and the
patient's family after a thorough discussion of the procedural
risks, benefits and alternatives. All questions were addressed.
Maximal Sterile Barrier Technique was utilized including caps, mask,
sterile gowns, sterile gloves, sterile drape, hand hygiene and skin
antiseptic. A timeout was performed prior to the initiation of the
procedure.

The epigastrium was prepped with Betadine in a sterile fashion, and
a sterile drape was applied covering the operative field. A sterile
gown and sterile gloves were used for the procedure.

A 5-French orogastric tube is placed under fluoroscopic guidance.
This accounted for the majority of the fluoroscopic time.

The stomach was distended with gas. Under fluoroscopic guidance, an
18 gauge needle was utilized to puncture the anterior wall of the
body of the stomach. An Amplatz wire was advanced through the needle
passing a T fastener into the lumen of the stomach. The T fastener
was secured for gastropexy. A 9-French sheath was inserted.

A snare was advanced through the 9-French sheath. HASHI was
advanced through the orogastric tube. It was snared then pulled out
the oral cavity, pulling the snare, as well. The leading edge of the
gastrostomy was attached to the snare. It was then pulled down the
esophagus and out the percutaneous site. As the catheter was
withdrawn through the puncture site of the abdominal wall, the
leading last so of the gastrostomy broke. This was then manually
grasped and withdrawn the remainder of the catheter, which also
displaced the T tack. Tube was secured in place. Contrast was
injected.

Patient tolerated the procedure well and remained hemodynamically
stable throughout.

No complications were encountered and no significant blood loss
encountered.
IMPRESSION: Status post fluoroscopic placed percutaneous gastrostomy tube, with
20 French pull-through.

## 2019-11-10 MED ORDER — BACITRACIN-NEOMYCIN-POLYMYXIN 400-5-5000 EX OINT
1.0000 "application " | TOPICAL_OINTMENT | Freq: Every day | CUTANEOUS | Status: AC
  Administered 2019-11-11 – 2019-11-16 (×6): 1 via TOPICAL
  Filled 2019-11-10 (×5): qty 1

## 2019-11-10 MED ORDER — ONDANSETRON HCL 4 MG/2ML IJ SOLN: 4.0000 mg | Freq: Once | INTRAMUSCULAR | Status: AC

## 2019-11-10 MED ORDER — MIDAZOLAM HCL 2 MG/2ML IJ SOLN
INTRAMUSCULAR | Status: AC | PRN
  Administered 2019-11-10 (×2): 0.5 mg via INTRAVENOUS

## 2019-11-10 MED ORDER — ONDANSETRON HCL 4 MG/2ML IJ SOLN
INTRAMUSCULAR | Status: AC
  Administered 2019-11-10: 4 mg via INTRAVENOUS
  Filled 2019-11-10: qty 2

## 2019-11-10 MED ORDER — CEFAZOLIN SODIUM-DEXTROSE 2-4 GM/100ML-% IV SOLN
INTRAVENOUS | Status: AC
  Administered 2019-11-10: 2 g
  Filled 2019-11-10: qty 100

## 2019-11-10 MED ORDER — IOHEXOL 300 MG/ML  SOLN: 50.0000 mL | Freq: Once | INTRAMUSCULAR | Status: DC | PRN

## 2019-11-10 MED ORDER — HYDRALAZINE HCL 50 MG PO TABS
50.0000 mg | ORAL_TABLET | Freq: Three times a day (TID) | ORAL | Status: DC
  Administered 2019-11-10 – 2019-11-11 (×2): 50 mg
  Filled 2019-11-10 (×2): qty 1

## 2019-11-10 MED ORDER — LIDOCAINE HCL 1 % IJ SOLN
INTRAMUSCULAR | Status: AC
  Filled 2019-11-10: qty 20

## 2019-11-10 MED ORDER — MIDAZOLAM HCL 2 MG/2ML IJ SOLN
INTRAMUSCULAR | Status: AC
  Filled 2019-11-10: qty 2

## 2019-11-10 MED ORDER — CEFAZOLIN SODIUM-DEXTROSE 2-4 GM/100ML-% IV SOLN
2.0000 g | INTRAVENOUS | Status: AC
  Administered 2019-11-10: 2 g via INTRAVENOUS
  Filled 2019-11-10: qty 100

## 2019-11-10 MED ORDER — FENTANYL CITRATE (PF) 100 MCG/2ML IJ SOLN
INTRAMUSCULAR | Status: AC | PRN
  Administered 2019-11-10: 50 ug via INTRAVENOUS

## 2019-11-10 MED ORDER — FENTANYL CITRATE (PF) 100 MCG/2ML IJ SOLN
INTRAMUSCULAR | Status: AC
  Filled 2019-11-10: qty 2

## 2019-11-10 MED ORDER — LIDOCAINE HCL 1 % IJ SOLN
INTRAMUSCULAR | Status: AC | PRN
  Administered 2019-11-10: 10 mL

## 2019-11-10 NOTE — Progress Notes (Signed)
Pt returned back to room after having PEG placement. Pt vomiting brown color liquid. MD notified of pt change in condition. MD gave verbal order for one time dose of Zofran 4mg  IV.   Zofran 4mg  administered IV. Call button placed within reach of pt. Pt reminded to call for assistance. Staff will continue to monitor.

## 2019-11-10 NOTE — Sedation Documentation (Signed)
Patient suctioned numerous times prior to procedure due to copious amounts of oral secretions and secretions from trach tube.

## 2019-11-10 NOTE — Progress Notes (Signed)
Nutrition Follow-up  RD working remotely.  DOCUMENTATION CODES:   Not applicable  INTERVENTION:   Once PEG is cleared for use, resume:   Osmolite 1.5at 31ml/h(1320ml per day) Prosource TF40ml BID  Provides2060kcal, 105gm protein, free water daily  NUTRITION DIAGNOSIS:   Inadequate oral intake related to inability to eat as evidenced by NPO status.  Ongoing  GOAL:   Patient will meet greater than or equal to 90% of their needs  Progressing   MONITOR:   Diet advancement, Labs, Weight trends, TF tolerance, Skin, I & O's  REASON FOR ASSESSMENT:   Ventilator    ASSESSMENT:   59 y.o. 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  He was intubated for airway protection and status and PCCM consulted for admission  8/11- extubated 8/12- s/p BSE- recommend continue NPO 8/13- cortrak tube placed (tip of tube in stomach), TF initiated 8/14- re-intubated; seizure activity 8/17- rectal tube placed 8/20 - trach placed 8/31- s/p MBSS- recommend continue NPO  Reviewed I/O's: -1.3 L x 24 hours and -936 ml since 10/27/19  UOP: 1.3 L x 24 hours  Admission weight: 68.7 kg Current wt: 72.7 kg   Pt remains NPO, dependent on TF. TF currently being held for PEG placement today (placement delayed due to Plavix washout).   Per CIR admissions coordinator notes, plan to d/c to CIR once PEG is placed.   Labs reviewed: CBGD: 123-221 (inpatient orders for glycemic control are 0-15 units insulin aspart every 4 hours, 2 units insulin aspart every 4 hours, and 12 units insulin glargine daily at bedtime.).   Diet Order:   Diet Order            Diet NPO time specified Except for: Sips with Meds  Diet effective midnight                 EDUCATION NEEDS:   No education needs have been identified at this time  Skin:  Skin Assessment: Reviewed RN  Assessment  Last BM:  11/08/19  Height:   Ht Readings from Last 1 Encounters:  10/10/19 6\' 1"  (1.854 m)    Weight:   Wt Readings from Last 1 Encounters:  11/10/19 72.7 kg    Ideal Body Weight:  83.6 kg  BMI:  Body mass index is 21.15 kg/m.  Estimated Nutritional Needs:   Kcal:  1900-2100  Protein:  100-115 grams  Fluid:  > 2 L    01/10/20, RD, LDN, CDCES Registered Dietitian II Certified Diabetes Care and Education Specialist Please refer to [facility] for RD and/or RD on-call/weekend/after hours pager

## 2019-11-10 NOTE — Progress Notes (Signed)
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 Patient is living in a assisted living facility and can ambulate by himself.  Due to concerns of seizure he was intubated for airway protection and taken to the ICU.  Eventually had tracheostomy placed on 8/23.  Since then he has had multiple swallowing evaluation which he continues to fail and progressing only very slowly.  Now the plan is to allow Plavix washout and place PEG tube on 11/10/2019.    Assessment & Plan:   Principal Problem:   Status epilepticus ([place]) Active Problems:   Carotid artery stenosis   Seizure (HCC)   CVA (cerebrovascular accident due to intracerebral hemorrhage) (HCC)/ left frontal lobe   AKI (acute kidney injury) ([place])   Essential hypertension   Type 2 diabetes mellitus with hyperlipidemia (HCC)   Acute on chronic chronic respiratory failure with hypoxemia requiring tracheostomy tube placement and mechanical ventilatory support. -Currently has tracheostomy in place.  Continue routine supportive care with frequent suctioning.  Advised to use incentive spirometer, Mucinex twice daily, bronchodilators scheduled twice daily and every 6 hours as needed. -Routine oral/tracheostomy care -Out of bed to chair. -Received 2 doses of IV Lasix on 9/5.  Improved his breathing  Fever, improved likely in the setting of pneumonia with concern for aspiration, resolved -Concerns of possible aspiration by previous provider. Procalcitonin negative  Status epilepticus in the setting of recent large left MCA stroke -Continue Vimpat, Keppra and Dilantin. Low Dilantin levels therefore dose increased.  Pharmacy to recheck and adjust as appropriate  Essential  hypertension, uncontrolled -Currently on Norvasc 10 mg daily -Increase hydralazine to 50 units 3 times daily  Urinary Retention Foley catheter removed, closely monitor him.  On bethanechol  AKI, improved Resolved.  Creatinine 1.2 around baseline  Diabetes type 2 with Hyperglycemia -Continue Accu-Cheks and sliding scale -NovoLog 2 units every 4 hours, continue bedtime Lantus  Dysphagia.  Plans for PEG tube placement today after aspirin and Plavix washout.  Tube feeds, aspirin and Plavix to be resumed once cleared by IR.  Normocytic Anemia -Hemoglobin is currently stable at 9.9.   Hx of Left MCA stroke with residual aphasia and right hemiparesis  -Aspirin Plavix on hold, Lipitor 80 mg daily.    Right arm swelling  -Likely from superficial thrombosis, recommend warm compresses  Abnormal LFT's, not worsening -In the setting of acute illness, continue to monitor  DVT prophylaxis: Lovenox Code Status: Full code Family Communication: Brother has been periodically updated by me.  Status is: Inpatient  Remains inpatient appropriate because:Hemodynamically unstable   Dispo: The patient is from: SNF              Anticipated d/c is to: CIR              Anticipated d/c date is: > 3 days              Patient currently is not medically stable to d/c.  Awaiting PEG tube placement thereafter will be resuming his tube feeds and appropriate medications.  Eventual plans to CIR.  Body mass index is 21.15 kg/m.     Subjective: Sitting up awaiting his procedure, no complaints  Examination: Constitutional: Not in acute distress Respiratory: b/l rhonchi Cardiovascular: Normal sinus rhythm, no rubs Abdomen:  Nontender nondistended good bowel sounds Musculoskeletal: No edema noted Skin: No rashes seen Neurologic: CN 2-12 grossly intact.  And nonfocal Psychiatric: Normal judgment and insight. Alert and oriented x 3. Normal mood.    Rectal tube, feeding tube and tracheostomy  in place  Objective: Vitals:   11/10/19 0316 11/10/19 0326 11/10/19 0750 11/10/19 1125  BP: (!) 170/86  (!) 155/74 (!) 174/79  Pulse: 80  83 93  Resp: '14  14 16  ' Temp: 98.2 F (36.8 C)  98.3 F (36.8 C) 98.4 F (36.9 C)  TempSrc: Oral  Axillary Oral  SpO2: 100%  97% 96%  Weight:  72.7 kg    Height:        Intake/Output Summary (Last 24 hours) at 11/10/2019 1214 Last data filed at 11/10/2019 0325 Gross per 24 hour  Intake --  Output 1150 ml  Net -1150 ml   Filed Weights   11/07/19 0456 11/09/19 0416 11/10/19 0326  Weight: 73.8 kg 74.4 kg 72.7 kg     Data Reviewed:   CBC: Recent Labs  Lab 11/04/19 0723 11/05/19 0341 11/06/19 1759 11/07/19 0314 11/08/19 0411 11/09/19 1019 11/10/19 1039  WBC 8.4   < > 7.4 8.9 7.6 9.6 12.5*  NEUTROABS 6.0  --   --   --   --   --   --   HGB 9.9*   < > 9.2* 9.4* 9.5* 10.2* 10.0*  HCT 30.2*   < > 28.6* 28.9* 28.8* 30.9* 31.2*  MCV 91.5   < > 91.4 90.6 91.7 91.2 90.7  PLT 449*   < > 421* 381 376 414* 398   < > = values in this interval not displayed.   Basic Metabolic Panel: Recent Labs  Lab 11/04/19 0723 11/05/19 0341 11/06/19 1759 11/07/19 0314 11/08/19 0411 11/09/19 1019 11/10/19 1039  NA 137   < > 136 135 138 139 140  K 3.5   < > 3.7 3.6 3.8 3.7 3.6  CL 98   < > 97* 99 102 99 101  CO2 26   < > '26 28 28 28 27  ' GLUCOSE 177*   < > 213* 119* 195* 138* 178*  BUN 26*   < > 28* 28* 29* 32* 32*  CREATININE 1.27*   < > 1.30* 1.17 1.24 1.29* 1.27*  CALCIUM 8.8*   < > 8.7* 8.9 8.8* 9.2 9.5  MG 2.0   < > 2.1 2.0 2.1 2.0 2.0  PHOS 4.4  --   --   --   --   --   --    < > = values in this interval not displayed.   GFR: Estimated Creatinine Clearance: 64.4 mL/min (A) (by C-G formula based on SCr of 1.27 mg/dL (H)). Liver Function Tests: Recent Labs  Lab 11/04/19 0723 11/07/19 0314  AST 64*  --   ALT 84*  --   ALKPHOS 167*  --   BILITOT 0.4  --   PROT 7.7  --   ALBUMIN 2.6* 2.7*   No results for input(s): LIPASE, AMYLASE  in the last 168 hours. No results for input(s): AMMONIA in the last 168 hours. Coagulation Profile: No results for input(s): INR, PROTIME in the last 168 hours. Cardiac Enzymes: No results for input(s): CKTOTAL, CKMB, CKMBINDEX, TROPONINI in the last 168 hours. BNP (last 3 results) No results for input(s): PROBNP in the last 8760 hours. HbA1C: No results for input(s): HGBA1C in the last 72 hours. CBG: Recent Labs  Lab 11/09/19  2038 11/09/19 2336 11/10/19 0321 11/10/19 0748 11/10/19 1128  GLUCAP 221* 172* 123* 109* 177*   Lipid Profile: No results for input(s): CHOL, HDL, LDLCALC, TRIG, CHOLHDL, LDLDIRECT in the last 72 hours. Thyroid Function Tests: No results for input(s): TSH, T4TOTAL, FREET4, T3FREE, THYROIDAB in the last 72 hours. Anemia Panel: No results for input(s): VITAMINB12, FOLATE, FERRITIN, TIBC, IRON, RETICCTPCT in the last 72 hours. Sepsis Labs: No results for input(s): PROCALCITON, LATICACIDVEN in the last 168 hours.  Recent Results (from the past 240 hour(s))  Culture, Urine     Status: None   Collection Time: 10/31/19 11:35 PM   Specimen: Urine, Catheterized  Result Value Ref Range Status   Specimen Description URINE, CATHETERIZED  Final   Special Requests NONE  Final   Culture   Final    NO GROWTH Performed at [facility], [address], [place], [place] [address]    Report Status 11/03/2019 FINAL  Final         Radiology Studies: No results found.      Scheduled Meds: . amLODipine  10 mg Per Tube Daily  . atorvastatin  80 mg Per Tube Daily  . bethanechol  15 mg Per Tube TID  . chlorhexidine gluconate (MEDLINE KIT)  15 mL Mouth Rinse BID  . Chlorhexidine Gluconate Cloth  6 each Topical Daily  . feeding supplement (PROSource TF)  45 mL Per Tube BID  . Gerhardt's butt cream   Topical Daily  . hydrALAZINE  25 mg Per Tube Q8H  . insulin aspart  0-15 Units Subcutaneous Q4H  . insulin aspart  2 Units Subcutaneous Q4H  . insulin  glargine  12 Units Subcutaneous QHS  . lacosamide  150 mg Per Tube BID  . levETIRAcetam  1,000 mg Per Tube BID  . mouth rinse  15 mL Mouth Rinse q12n4p  . phenytoin  175 mg Per Tube TID  . QUEtiapine  50 mg Per Tube QHS  . scopolamine  1 patch Transdermal Q72H  . sennosides  5 mL Per Tube BID   Continuous Infusions: . sodium chloride Stopped (10/15/19 1834)  . feeding supplement (OSMOLITE 1.5 CAL) 1,000 mL (11/09/19 2330)     LOS: 30 days   Time spent= 35 mins    Kimora Stankovic Arsenio Loader, MD [facility]  If 7PM-7AM, please contact night-coverage  11/10/2019, 12:14 PM

## 2019-11-10 NOTE — Progress Notes (Signed)
MEDICATION RELATED CONSULT NOTE - FOLLOW UP   Pharmacy Consult for Phenytoin Indication: Seizures   No Known Allergies  Patient Measurements: Height: 6\' 1"  (185.4 cm) Weight: 72.7 kg (160 lb 4.4 oz) IBW/kg (Calculated) : 79.9  Vital Signs: Temp: 98.3 F (36.8 C) (09/07 0750) Temp Source: Axillary (09/07 0750) BP: 155/74 (09/07 0750) Pulse Rate: 83 (09/07 0750) Intake/Output from previous day: 09/06 0701 - 09/07 0700 In: -  Out: 1270 [Urine:1270] Intake/Output from this shift: No intake/output data recorded.  Labs: Recent Labs    11/08/19 0411 11/09/19 1019  WBC 7.6 9.6  HGB 9.5* 10.2*  HCT 28.8* 30.9*  PLT 376 414*  CREATININE 1.24 1.29*  MG 2.1 2.0   Estimated Creatinine Clearance: 63.4 mL/min (A) (by C-G formula based on SCr of 1.29 mg/dL (H)).    Assessment: 59 yo M admitted with seizures. Last seizure noted 8/14  - 8/7 loaded with DPH (goal 15-20) and Keppra - 8/9 Keppra dose reduced d/t renal fxn 750mg  IV q12h > 8/13 incr back to 1000mg  q12h  8/7 DPH 1500mg  IV x1 then 100mg  Q8H (~4 mg/kg/d based on TBW; IBW 80 kg) 8/8 DPH level (post-load): 20.1 >> no adjustments, cont current dosing 8/11 DPH 14.1, albumin 4.1 8/12 DPH 13.4.  Vimpat and Onfi added. 8/13 DPH 12.6, Albumin 2.5, corrected 21. Vimpat and Keppra increased d/t improved CrCl 8/15 DPH 7.5 > corrected = 12.5, free level ~0.95 >> 150mg  x1 then resume 100 Q8hr, renal fx worsening 8/17 DPH 4.3 (Corrects to 9.35 based on albumin from 8/18) - continue current dose per MD  8/18: Phenytoin level 3 adjusts to 8.4 8/24: Switched from IV to PO  8/30: Phenytoin level 3.5 adjusted to 6 for albumin and renal function (goal 15-20)  9/4: Phenytoin level 3.8 adjusted to 5.9 for albumin and renal function  Phenytoin level dropped significantly after switching to per tube administration due to binding by continuous tube feeds. Would be difficult to hold tube feeds 2hr before and after each dose. Could switch to  nocturnal feeds but would have to increase TF rate significantly to 148ml/hr to get the same nutrition.   9/4 update: Discussed with MD to increase dose vs switch back to IV.  Per MD, increase dose and recheck levels. Dose increased, no reported seizures.  Will follow levels  Pt is still on phenytoin along with lacosamide and levetiracetam for his seizures. His last level was subtherapeutic and the dose was adjusted. We will repeat a level this Thursday.   Goal of Therapy:  Phenytoin level 15-20 Seizure control  Plan:  -Cont phenytoin to 175 mg per tube TID (~7mg /kg/day based on protocol stating to inc TDD no more than 100mg /day), adjust to goal range of 15-20   -F/u phenytoin and albumin levels 9/9 -Planned PEG placement 9/7, f/u if tube feeds are changed, will need to adjust phenytoin dose as well   11/4, PharmD, BCIDP, AAHIVP, CPP Infectious Disease Pharmacist 11/10/2019 9:56 AM

## 2019-11-10 NOTE — Procedures (Signed)
Interventional Radiology Procedure Note  Procedure: Placement of percutaneous 20F pull-through gastrostomy tube. Complications: None Recommendations: - NPO except for sips and chips remainder of today and overnight - Maintain G-tube to LWS until tomorrow morning  - May advance diet as tolerated and begin using tube tomorrow morning  Signed,   Morgaine Kimball S. Lyah Millirons, DO   

## 2019-11-10 NOTE — Progress Notes (Addendum)
  Speech Language Pathology Treatment: Dysphagia  Patient Details Name: Corey Bennett MRN: [id number] DOB: 1960-05-02 Today's Date: 11/10/2019 Time: 1040-1100 SLP Time Calculation (min) (ACUTE ONLY): 20 min  Assessment / Plan / Recommendation Clinical Impression  Pt continues to present with severe dysphagia with PEG placement planned for later this date.  Pt exhibited excellent participation and effort with therapy tasks.   PMV was placed with PO trial and intermittently during session.  With PMV in place pt was able to bring up sections to oral cavity for expectoration; however, cough response sounds weak and may be only partially effective at clearing secretions/aspiration.  With single ice chip pt exhibited immediate oral response.  After prolonged oral phase, pt was able to generate pharyngeal swallow response.  There was immediate cough and pt endorsed ice chip going down the wrong way.  Pt declined further POs.  Pt may benefit from thermal stim other than ice chips to improve strength and efficiency of oral phase of swallowing.  Pt was unable to generate a volitional swallow despite effort, limiting which swallowing exercises will be effective.  This is consistent with MBS which showed sensory pharyngeal stimulation was required for swallow reflex.  Pt completed chin tuck against resistance (CTAR) x10 with good effort and execution, and lingual protrusion to resistance x10 with good effort and fair execution.  Lingual protrusion was effortful with pt exhibiting difficulty getting tongue past teeth and lips.  Pt benefited from verbal cuing. Pt has poor lingual strength.  Glottal closure exercises deferred 2/2 concern for VF paresis.  Increased respiratory rate noted with swallow exercises.  Pt denied discomfort, and this may be result of physical exertion with swallowing exercises.  Some rattling breath sounds noted.  Notified RN that pt may need suctioning. Pt will likely need prolonged therapy course to  improve swallow function.     HPI HPI: Pt is a 59 yo male admitted from ALF with status epilepticus in the setting of recent large L MCA stroke, requiring intubation 8/8-8/11. BSE 8/12 recommended NPO with concern for reduced secretion management and pt was subsequently reintubated 8/14. [name] 8/19. Pt was evaluated by OP SLP 7/26 (after completing CIR in [place] and [facility] SLP), noting "receptive and expressive aphasia (likely severe) and some degree of verbal apraxia." He was awaiting a speech generating device. PMH: CVA with aphasia and hemiparesis, HTN, hypercholesterolemia, diabetes.  Plans for PEG placement 9/7      SLP Plan  Continue with current plan of care       Recommendations  Diet recommendations: NPO Medication Administration: Via alternative means      Patient may use Passy-Muir Speech Valve: During all therapies with supervision;Intermittently with supervision PMSV Supervision: Full         Oral Care Recommendations: Oral care QID Follow up Recommendations: Inpatient Rehab SLP Visit Diagnosis: Dysphagia, oropharyngeal phase (R13.12) Plan: Continue with current plan of care       GO                Kerrie Pleasure, MA, CCC-SLP Acute Rehabilitation Services Office: [phone] 11/10/2019, 11:10 AM

## 2019-11-10 NOTE — TOC Progression Note (Signed)
Transition of Care [facility]) - Progression Note    Patient Details  Name: Kennith Morss MRN: [id number] Date of Birth: 1960/08/08  Transition of Care [facility]) CM/SW Contact  Baldemar Lenis, [place] Phone Number: 11/10/2019, 3:31 PM  Clinical Narrative:   CSW spoke with patient's mother, Drinda Butts, about placement after rehab. Mother asked if CSW had spoken with any places in [place], and CSW had spoken with [facility] who was out of network with his insurance. CSW answered mother's questions and discussed expectations for timeframe before he would be able to go to [place]. Mother is hopeful to get the patient into [facility] in [place], and asked about when to send information over there to see if he'd qualify. Mother is hopeful that he will be appropriate for ALF after CIR. Mother appreciative of CSW time and will call with any further questions.    Expected Discharge Plan: Skilled Nursing Facility Barriers to Discharge: English as a second language teacher, Continued Medical Work up  Expected Discharge Plan and Services Expected Discharge Plan: Skilled Nursing Facility     Post Acute Care Choice: Skilled Nursing Facility Living arrangements for the past 2 months: Assisted Living Facility                                       Social Determinants of Health (SDOH) Interventions    Readmission Risk Interventions No flowsheet data found.

## 2019-11-10 NOTE — Progress Notes (Signed)
Occupational Therapy Treatment Patient Details Name: Corey Bennett MRN: [id number] DOB: 04/18/1960 Today's Date: 11/10/2019    History of present illness 59 y.o. 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  He was intubated for airway protection on 10/10/19. Pt extubated on 10/14/19.  on 8/14, pt had continued twitching of Rt UE with AMS, and decreased 02 sats.  He was intubated.  Diagnosis: status epilepticus in setting of prior large Lt MCA infarct; acute kidney injury, possible acute meningitis.  He underwent tracheostomy on 8/19/2.  PMH includes: poorly controlled DM, dysphagia, HTN.  PEG possible 11/10/19.   OT comments  Patient agreeable to limited bed level OT session. Engaged in grooming at bed level with setup for washing face but mod assist to wash hands.  Patient requesting PROM of R UE, completing PROM and gentle stretch to R UE from shoulder to hand; repositioned UE on pillow with hand extension.  Patient reports having a splint at home, but did not wear it; mentioned figuring out a new splint and he reports "no".  Patient declines OOB today due to plan for PEG later today.  Will follow acutely.    Follow Up Recommendations  Supervision/Assistance - 24 hour;CIR    Equipment Recommendations  None recommended by OT    Recommendations for Other Services Rehab consult    Precautions / Restrictions Precautions Precautions: Fall (R sided weakness, cognitive deficits ) Precaution Comments: trach Restrictions Weight Bearing Restrictions: No       Mobility Bed Mobility                  Transfers                      Balance                                           ADL either performed or assessed with clinical judgement   ADL Overall ADL's : Needs assistance/impaired Eating/Feeding: NPO   Grooming: Wash/dry hands;Wash/dry face;Moderate  assistance;Bed level Grooming Details (indicate cue type and reason): sitting in bed with HOB elevated, washing face with setup assist but mod assist to wash hands                                General ADL Comments: limited session to bed level      Vision       Perception     Praxis      Cognition Arousal/Alertness: Awake/alert Behavior During Therapy: Flat affect Overall Cognitive Status: Impaired/Different from baseline Area of Impairment: Memory;Following commands;Safety/judgement;Awareness;Problem solving                     Memory: Decreased short-term memory Following Commands: Follows one step commands consistently Safety/Judgement: Decreased awareness of safety;Decreased awareness of deficits Awareness: Emergent Problem Solving: Difficulty sequencing;Requires verbal cues;Requires tactile cues General Comments: patient following commands for ADL engagement with increased time, limited engagement during session         Exercises Other Exercises Other Exercises: PROM of R UE from shoulder to hand, discussed use of splint for hand but patient declines need    Shoulder Instructions       General Comments VSS  on RA, notified RN of increased congestion and RN reports plan to suction patient     Pertinent Vitals/ Pain       Pain Assessment: Faces Faces Pain Scale: No hurt  Home Living                                          Prior Functioning/Environment              Frequency  Min 2X/week        Progress Toward Goals  OT Goals(current goals can now be found in the care plan section)  Progress towards OT goals: Progressing toward goals  Acute Rehab OT Goals Patient Stated Goal: pt unable to state  OT Goal Formulation: Patient unable to participate in goal setting  Plan Discharge plan remains appropriate    Co-evaluation                 AM-PAC OT "6 Clicks" Daily Activity     Outcome Measure    Help from another person eating meals?: Total Help from another person taking care of personal grooming?: A Lot Help from another person toileting, which includes using toliet, bedpan, or urinal?: A Lot Help from another person bathing (including washing, rinsing, drying)?: A Lot Help from another person to put on and taking off regular upper body clothing?: A Lot Help from another person to put on and taking off regular lower body clothing?: A Lot 6 Click Score: 11    End of Session    OT Visit Diagnosis: Hemiplegia and hemiparesis;Cognitive communication deficit (R41.841) Symptoms and signs involving cognitive functions: Cerebral infarction Hemiplegia - Right/Left: Right Hemiplegia - dominant/non-dominant: Dominant Hemiplegia - caused by: Cerebral infarction   Activity Tolerance Patient tolerated treatment well   Patient Left in bed;with call bell/phone within reach;with bed alarm set   Nurse Communication Mobility status        Time: 2671-2458 OT Time Calculation (min): 13 min  Charges: OT General Charges $OT Visit: 1 Visit OT Treatments $Self Care/Home Management : 8-22 mins  Barry Brunner, OT Acute Rehabilitation Services Pager [phone] Office [phone]    Chancy Milroy 11/10/2019, 12:52 PM

## 2019-11-11 ENCOUNTER — Inpatient Hospital Stay (HOSPITAL_COMMUNITY): Payer: BC Managed Care – PPO

## 2019-11-11 LAB — BASIC METABOLIC PANEL
Anion gap: 12 (ref 5–15)
BUN: 35 mg/dL — ABNORMAL HIGH (ref 6–20)
CO2: 29 mmol/L (ref 22–32)
Calcium: 9.4 mg/dL (ref 8.9–10.3)
Chloride: 99 mmol/L (ref 98–111)
Creatinine, Ser: 1.43 mg/dL — ABNORMAL HIGH (ref 0.61–1.24)
GFR calc Af Amer: >60 mL/min (ref >60)
GFR calc non Af Amer: 53 mL/min — ABNORMAL LOW (ref >60)
Glucose, Bld: 97 mg/dL (ref 70–99)
Potassium: 3.1 mmol/L — ABNORMAL LOW (ref 3.5–5.1)
Sodium: 140 mmol/L (ref 135–145)

## 2019-11-11 LAB — MAGNESIUM: Magnesium: 2.2 mg/dL (ref 1.7–2.4)

## 2019-11-11 LAB — GLUCOSE, CAPILLARY
Glucose-Capillary: 100 mg/dL — ABNORMAL HIGH (ref 70–99)
Glucose-Capillary: 105 mg/dL — ABNORMAL HIGH (ref 70–99)
Glucose-Capillary: 121 mg/dL — ABNORMAL HIGH (ref 70–99)
Glucose-Capillary: 124 mg/dL — ABNORMAL HIGH (ref 70–99)
Glucose-Capillary: 131 mg/dL — ABNORMAL HIGH (ref 70–99)
Glucose-Capillary: 139 mg/dL — ABNORMAL HIGH (ref 70–99)
Glucose-Capillary: 142 mg/dL — ABNORMAL HIGH (ref 70–99)

## 2019-11-11 LAB — CBC
HCT: 31.2 % — ABNORMAL LOW (ref 39.0–52.0)
Hemoglobin: 10 g/dL — ABNORMAL LOW (ref 13.0–17.0)
MCH: 29.2 pg (ref 26.0–34.0)
MCHC: 32.1 g/dL (ref 30.0–36.0)
MCV: 91 fL (ref 80.0–100.0)
Platelets: 417 10*3/uL — ABNORMAL HIGH (ref 150–400)
RBC: 3.43 MIL/uL — ABNORMAL LOW (ref 4.22–5.81)
RDW: 13.5 % (ref 11.5–15.5)
WBC: 13.5 10*3/uL — ABNORMAL HIGH (ref 4.0–10.5)
nRBC: 0 % (ref 0.0–0.2)

## 2019-11-11 IMAGING — DX DG ABDOMEN 1V
2 series · 2 of 2 positions shown · non-contrast
Comparison: X-ray abdomen [DATE], x-ray IR percutaneous
placement gastrostomy [DATE]

CLINICAL DATA: Shortness of breath, nausea, vomiting. History of
hypertension diabetes, stroke, paralysis

EXAM:
ABDOMEN - 1 VIEW

[abdomen kub (1 of 2)]
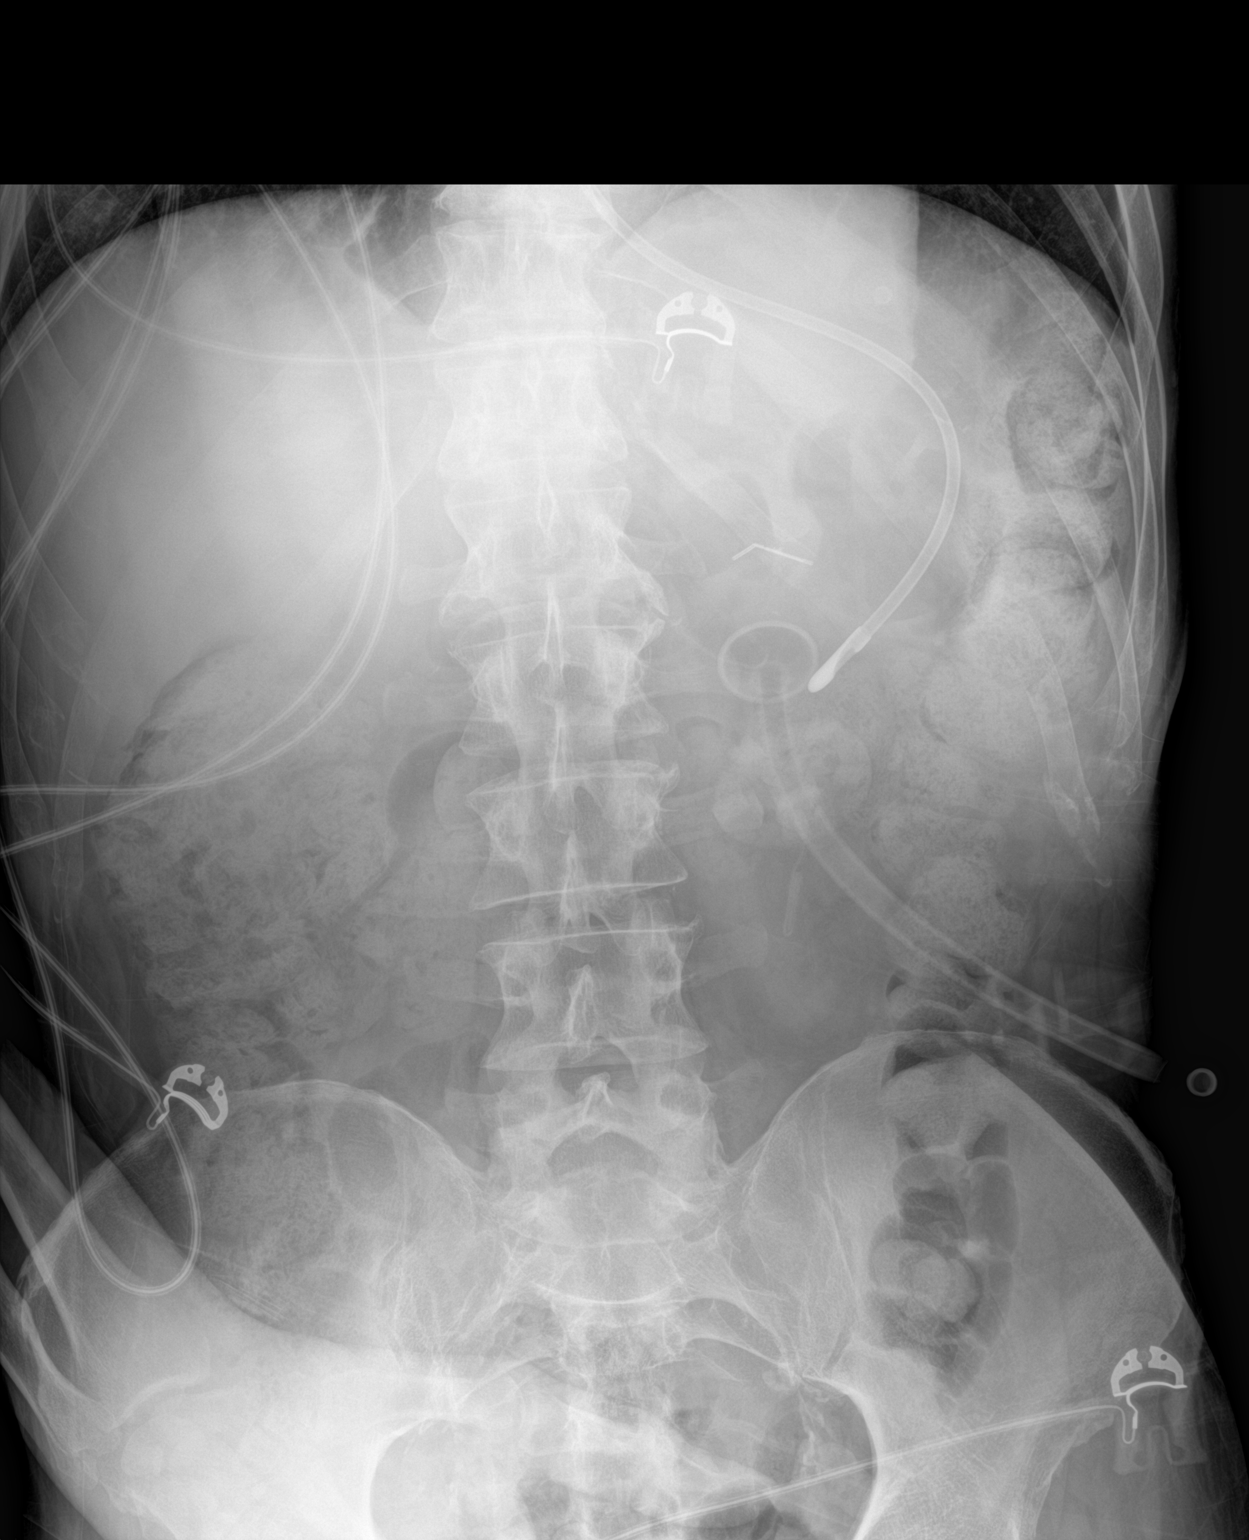

[abdomen kub (2 of 2)]
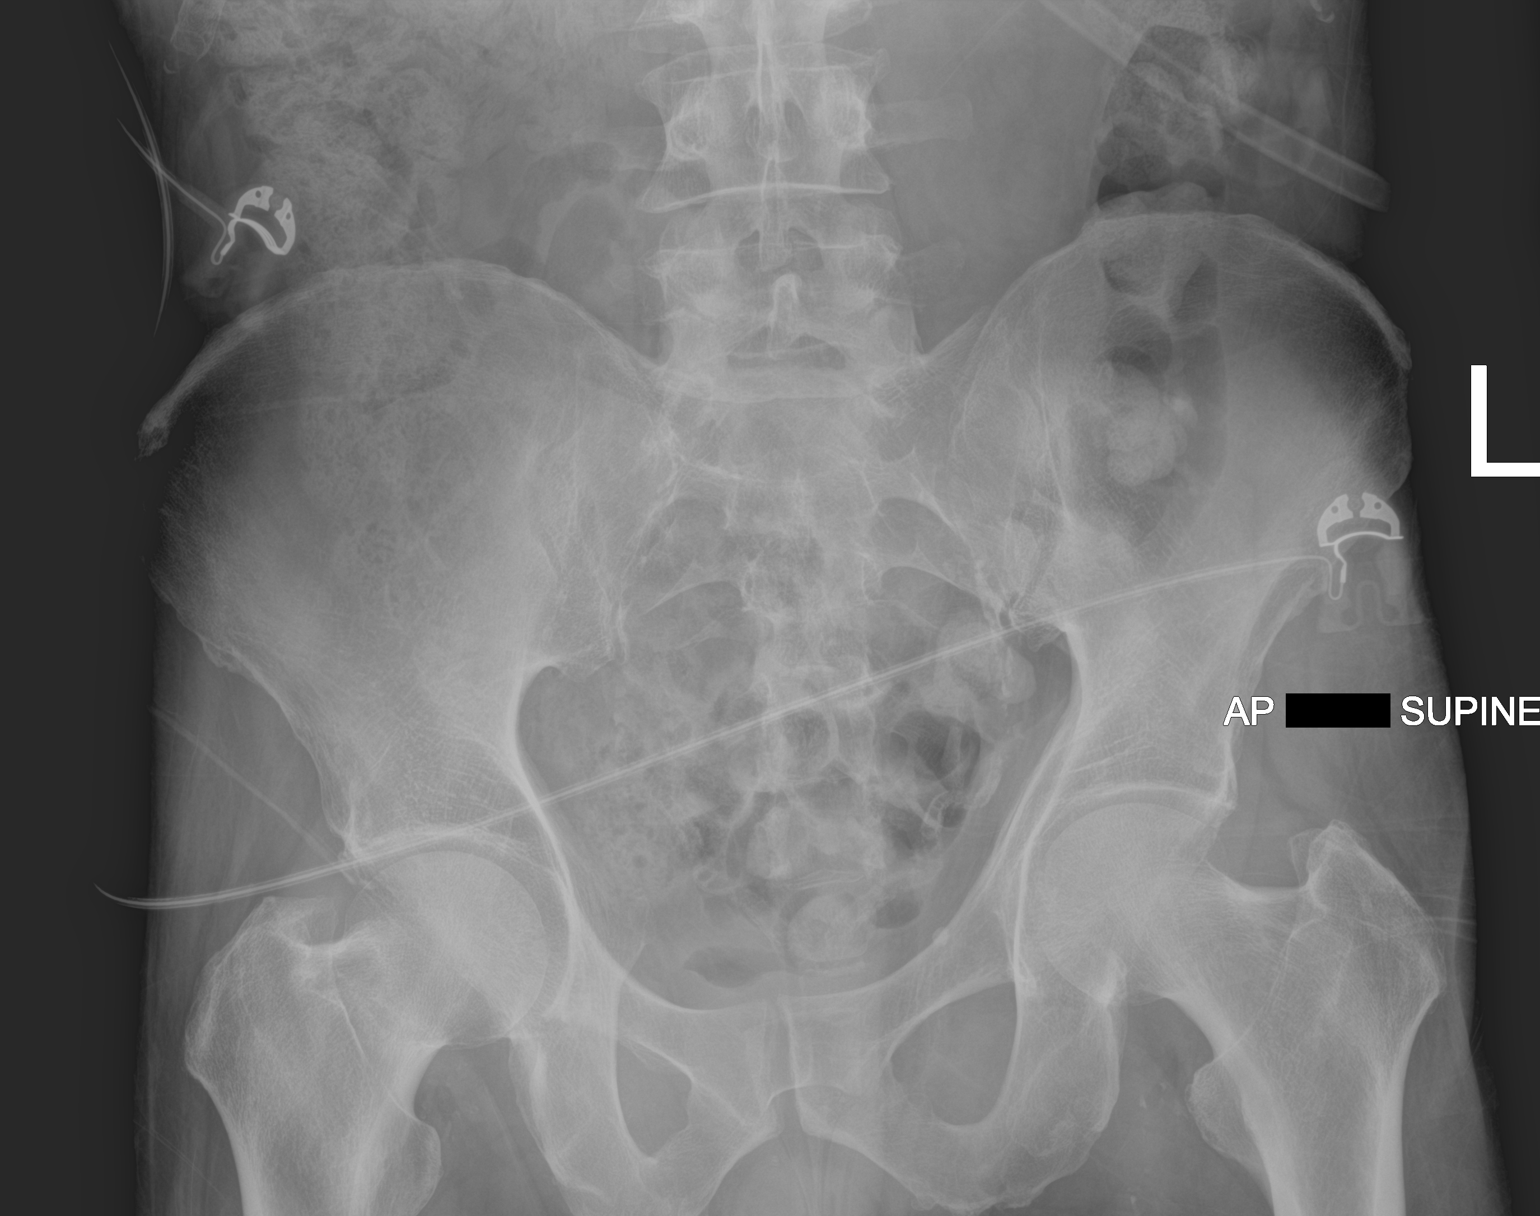

[2 of 2 positions shown; findings below may reference images not displayed]

FINDINGS: Enteric tube noted coursing below the diaphragm with tip overlying
the expected region of the gastric lumen. Gastrostomy tube with
inflated balloon overlying the expected region of the gastric lumen.
The shaped metallic density just superior to the gastrostomy tube
balloon likely represents fasteners for the tube.

The bowel gas pattern is normal. Stool throughout the colon.
Possible trace free intraperitoneal gas overlying the right mid
abdomen which is in keeping with recent percutaneous interventional
radiology procedure. No radio-opaque calculi.

Visualized osseous structures demonstrate no acute abnormality.

Visualized bilateral lower hemi thoraces are unremarkable.
IMPRESSION: Enteric tube and gastrostomy tube overlying the expected region of
the gastric lumen.

Nonobstructive bowel gas pattern with stool throughout the colon.

Possible trace free intraperitoneal gas overlying the right mid
abdomen which is in keeping with recent percutaneous interventional
radiology procedure.

## 2019-11-11 IMAGING — DX DG CHEST 1V PORT
1 series · 1 of 1 positions shown · non-contrast
Comparison: Chest x-ray dated [DATE].

CLINICAL DATA: Shortness of breath with nausea and vomiting.

EXAM:
PORTABLE CHEST 1 VIEW

[chest ap]
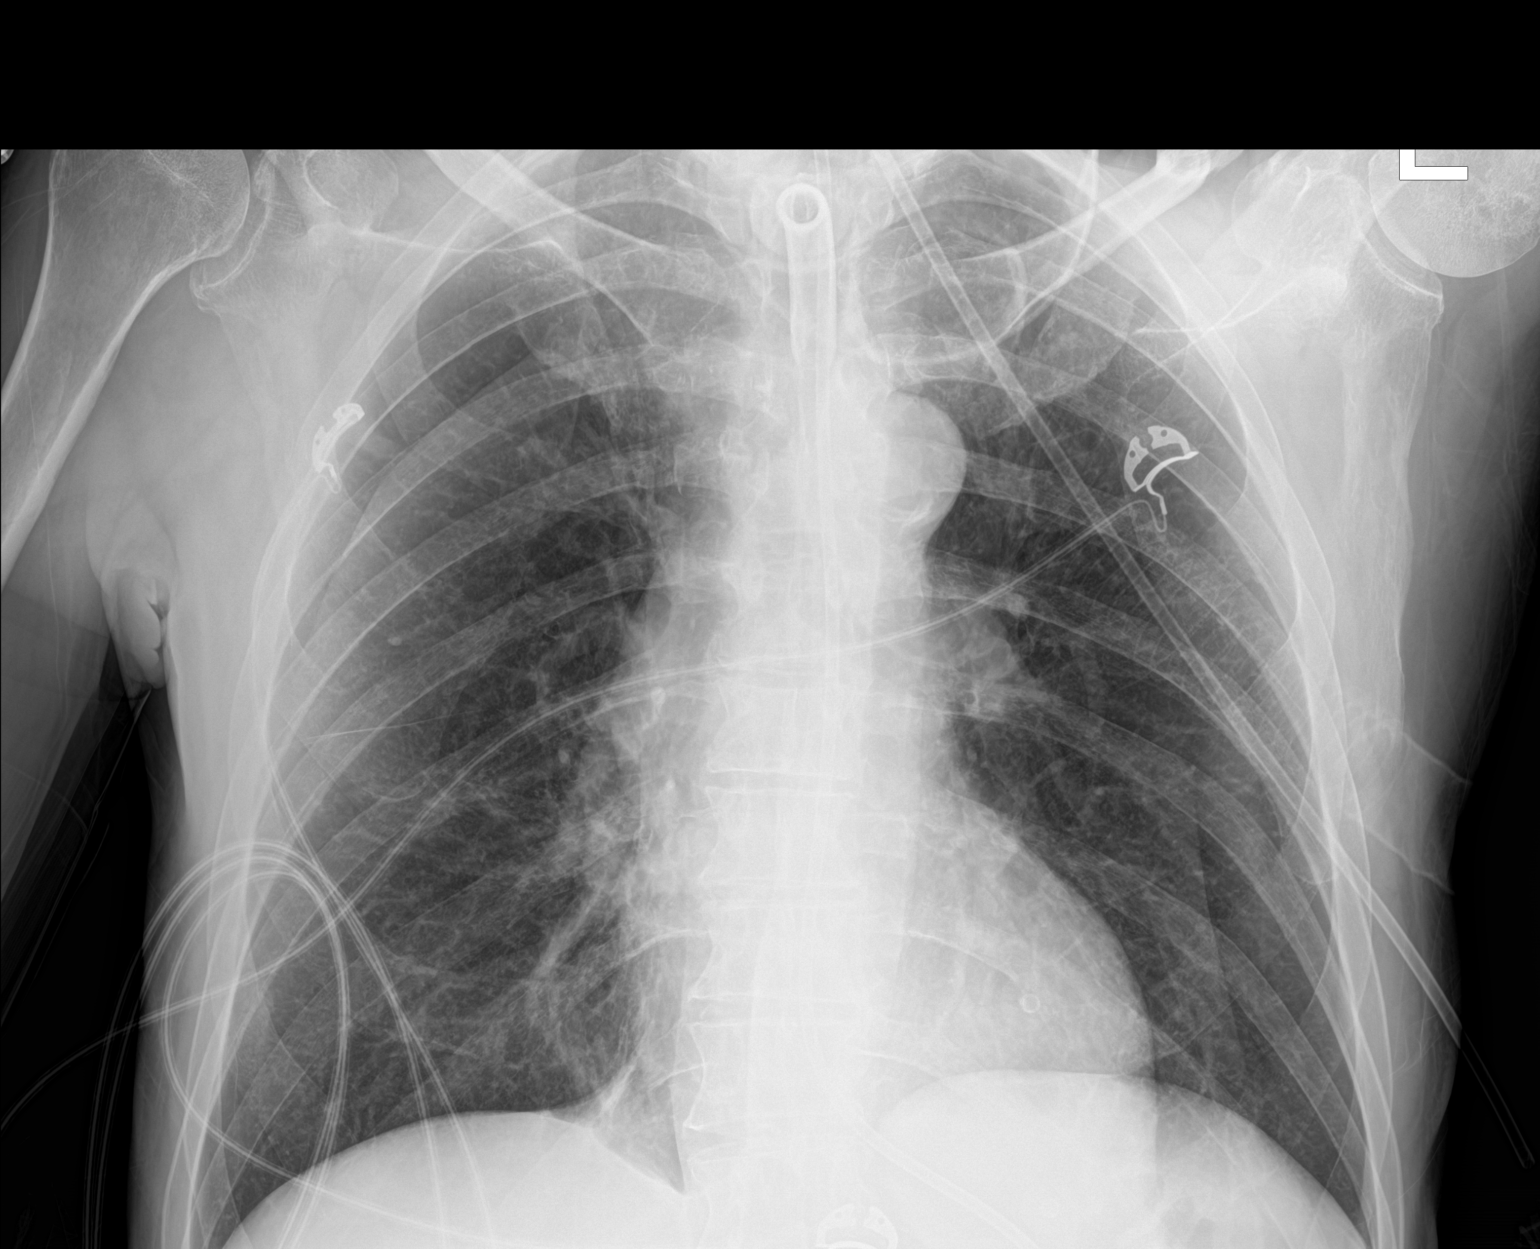

[1 of 1 positions shown; findings below may reference images not displayed]

FINDINGS: Unchanged tracheostomy and feeding tubes. The heart size and
mediastinal contours are within normal limits. Normal pulmonary
vascularity. Improved aeration at the lung bases. No focal
consolidation, pleural effusion, or pneumothorax. No acute osseous
abnormality.
IMPRESSION: 1. Improved aeration at the lung bases. No active cardiopulmonary
disease.

## 2019-11-11 MED ORDER — FAMOTIDINE IN NACL 20-0.9 MG/50ML-% IV SOLN
20.0000 mg | Freq: Two times a day (BID) | INTRAVENOUS | Status: DC
  Administered 2019-11-12 – 2019-11-15 (×9): 20 mg via INTRAVENOUS
  Filled 2019-11-11 (×10): qty 50

## 2019-11-11 MED ORDER — HYDRALAZINE HCL 20 MG/ML IJ SOLN: 5.0000 mg | Freq: Four times a day (QID) | INTRAMUSCULAR | Status: DC | PRN

## 2019-11-11 MED ORDER — SODIUM CHLORIDE 0.9 % IV SOLN
250.0000 mg | Freq: Once | INTRAVENOUS | Status: AC
  Administered 2019-11-11: 250 mg via INTRAVENOUS
  Filled 2019-11-11: qty 5

## 2019-11-11 MED ORDER — ONDANSETRON HCL 4 MG/2ML IJ SOLN
4.0000 mg | Freq: Four times a day (QID) | INTRAMUSCULAR | Status: DC | PRN
  Administered 2019-11-11: 4 mg via INTRAVENOUS
  Filled 2019-11-11: qty 2

## 2019-11-11 MED ORDER — PHENYTOIN SODIUM 50 MG/ML IJ SOLN
100.0000 mg | Freq: Two times a day (BID) | INTRAMUSCULAR | Status: DC
  Administered 2019-11-11 – 2019-11-12 (×2): 100 mg via INTRAVENOUS
  Filled 2019-11-11 (×2): qty 2

## 2019-11-11 MED ORDER — SODIUM CHLORIDE 0.9 % IV SOLN
150.0000 mg | Freq: Two times a day (BID) | INTRAVENOUS | Status: DC
  Administered 2019-11-11 – 2019-11-13 (×5): 150 mg via INTRAVENOUS
  Filled 2019-11-11 (×6): qty 15

## 2019-11-11 MED ORDER — LEVETIRACETAM IN NACL 1000 MG/100ML IV SOLN
1000.0000 mg | Freq: Two times a day (BID) | INTRAVENOUS | Status: DC
  Administered 2019-11-11 – 2019-11-13 (×5): 1000 mg via INTRAVENOUS
  Filled 2019-11-11 (×5): qty 100

## 2019-11-11 MED ORDER — DEXTROSE IN LACTATED RINGERS 5 % IV SOLN: INTRAVENOUS | Status: AC

## 2019-11-11 MED ORDER — SODIUM CHLORIDE 0.9 % IV SOLN
150.0000 mg | INTRAVENOUS | Status: DC
  Filled 2019-11-11: qty 3

## 2019-11-11 MED ORDER — POTASSIUM CHLORIDE 10 MEQ/100ML IV SOLN
10.0000 meq | INTRAVENOUS | Status: AC
  Administered 2019-11-11 (×4): 10 meq via INTRAVENOUS
  Filled 2019-11-11 (×4): qty 100

## 2019-11-11 NOTE — Progress Notes (Signed)
Dr Lowell Guitar would like phenytoin, lacosamide, and levetiracetam change to IV for now.   Ulyses Southward, PharmD, BCIDP, AAHIVP, CPP Infectious Disease Pharmacist 11/11/2019 11:44 AM

## 2019-11-11 NOTE — Progress Notes (Signed)
Pt refused suctioning @ 0510, despite extended bout of congested coughing.

## 2019-11-11 NOTE — Progress Notes (Signed)
Inpatient Rehab Admissions Coordinator:   Micah Flesher to meet with pt at bedside.  He was sleeping soundly and I did not wake him.  Note medically limited in PT today 2/2 PEG tube to suction and draining dark fluid.  Will need to withdraw insurance request and re-open when pt closer to being medically ready and able to tolerate CIR.   Estill Dooms, PT, DPT Admissions Coordinator [phone] 11/11/19  12:51 PM

## 2019-11-11 NOTE — Progress Notes (Signed)
Physical Therapy Treatment Patient Details Name: Corey Bennett MRN: [id number] DOB: Sep 19, 1960 Today's Date: 11/11/2019    History of Present Illness 59 y.o. 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  He was intubated for airway protection on 10/10/19. Pt extubated on 10/14/19.  on 8/14, pt had continued twitching of Rt UE with AMS, and decreased 02 sats.  He was intubated.  Diagnosis: status epilepticus in setting of prior large Lt MCA infarct; acute kidney injury, possible acute meningitis.  He underwent tracheostomy on 8/19/2.  PMH includes: poorly controlled DM, dysphagia, HTN.  PEG  11/10/19.    PT Comments    Patient received in bed, lying on side, spit up/vomit on floor and bed. Patient is agreeable to activity. RN called due to patient having new PEG and suction attached. RN advised not to get patient up to recliner and she did not want to turn off suction to allow for walking until she had spoken with MD about him. Patient returned to supine with min assist +2 for safety, line management.  He declined bed exercises. Patient will continue to benefit from skilled PT while here to improve functional mobility, safety and independence.    Follow Up Recommendations  CIR    Equipment Recommendations  Wheelchair (measurements PT);Wheelchair cushion (measurements PT)    Recommendations for Other Services       Precautions / Restrictions Precautions Precautions: Fall Precaution Comments: trach, PEG, suction Restrictions Weight Bearing Restrictions: No    Mobility  Bed Mobility Overal bed mobility: Needs Assistance Bed Mobility: Supine to Sit     Supine to sit: Min assist;+2 for safety/equipment Sit to supine: Min assist;+2 for safety/equipment   General bed mobility comments: Patient seated on edge of bed with min assist +2 to manage lines. Patient with decreased awareness of  this.  Transfers                 General transfer comment: Unable to do oob activity this day due to patient with a lot of spit up/vomited it out on floor and bed prior to arrival.  Ambulation/Gait             General Gait Details: unable to attempt today   Stairs             Wheelchair Mobility    Modified Rankin (Stroke Patients Only)       Balance Overall balance assessment: Needs assistance Sitting-balance support: Feet supported;Single extremity supported Sitting balance-Leahy Scale: Fair Sitting balance - Comments: supervision EOB, flexed upper trunk/cervical spine. Able to remove UE WB, but not challenges                                    Cognition Arousal/Alertness: Awake/alert Behavior During Therapy: Flat affect Overall Cognitive Status: No family/caregiver present to determine baseline cognitive functioning Area of Impairment: Memory;Following commands;Safety/judgement;Awareness;Problem solving                     Memory: Decreased short-term memory Following Commands: Follows one step commands with increased time Safety/Judgement: Decreased awareness of safety;Decreased awareness of deficits Awareness: Emergent Problem Solving: Difficulty sequencing;Requires verbal cues;Requires tactile cues General Comments: patient with flat affect, decreased responsiveness. Have to get right in front of him to ask questions/get response      Exercises  General Comments        Pertinent Vitals/Pain Pain Assessment: Faces Faces Pain Scale: No hurt    Home Living                      Prior Function            PT Goals (current goals can now be found in the care plan section) Acute Rehab PT Goals Patient Stated Goal: pt unable to state  Time For Goal Achievement: 11/25/19 Potential to Achieve Goals: Fair Progress towards PT goals: Not progressing toward goals - comment (unable to progress this day due to  medical complications)    Frequency    Min 3X/week      PT Plan Current plan remains appropriate    Co-evaluation              AM-PAC PT "6 Clicks" Mobility   Outcome Measure  Help needed turning from your back to your side while in a flat bed without using bedrails?: A Little Help needed moving from lying on your back to sitting on the side of a flat bed without using bedrails?: A Little Help needed moving to and from a bed to a chair (including a wheelchair)?: A Little Help needed standing up from a chair using your arms (e.g., wheelchair or bedside chair)?: A Lot Help needed to walk in hospital room?: A Little Help needed climbing 3-5 steps with a railing? : Total 6 Click Score: 15    End of Session   Activity Tolerance: Other (comment);Treatment limited secondary to medical complications (Comment) (patient had just coughed a lot and vomited up sputum on bed and floor. 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

## 2019-11-11 NOTE — Progress Notes (Signed)
PROGRESS NOTE    Corey Bennett  RCB:[id number] DOB: 06-Feb-1961 DOA: 10/10/2019 PCP: Patient, No Pcp Per   Chief Complaint  Patient presents with  . 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 [facility] living [facility] and can ambulate by himself.  Due to concerns of seizure he was intubated for airway protection and taken to the ICU.  Eventually had tracheostomy placed on 8/23.  Since then he has had multiple swallowing evaluation which he continues to fail and progressing only very slowly.  He's now s/p PEG on 9/7.  Assessment & Plan:   Principal Problem:   Status epilepticus ([place]) Active Problems:   Carotid artery stenosis   Seizure (HCC)   CVA (cerebrovascular accident due to intracerebral hemorrhage) (HCC)/ left frontal lobe   AKI (acute kidney injury) ([place])   Essential hypertension   Type 2 diabetes mellitus with hyperlipidemia (HCC)  Acute on chronic chronic respiratory failure with hypoxemia requiring tracheostomy tube placement and mechanical ventilatory support. -Currently has tracheostomy in place.  Continue routine supportive care with frequent suctioning.  Advised to use incentive spirometer, Mucinex twice daily, bronchodilators scheduled twice daily and every 6 hours as needed. -Routine oral/tracheostomy care -Out of bed to chair. -Received 2 doses of IV Lasix on 9/5.  Improved his breathing  Nausea  Vomiting  S/p PEG Placement:  At least 2 episodes of clear brownish emesis since PEG placement yesterday He's had significant output (~700 cc's emptied from cannister around 5 PM with maybe additional 200 cc estimated by nursing since then) from PEG tube over past 24 hrs Continue NPO and peg to LIS KUB with enteric tube and G tube overlying expected region of gastric  lumen as well as possible trace free intraperitoneal gas in keeping with recent perc IR procedure.  CXR without acute cardio pulm dz. Follow CT abdomen pelvis Zofran prn  Status epilepticus in the setting of recent large left MCA stroke -Continue Vimpat, Keppra and Dilantin. Low Dilantin levels therefore dose increased.  Pharmacy to recheck and adjust as appropriate - transition to IV while issues with peg above  Fever, improvedlikely in the setting of pneumonia with concern for aspiration, resolved -Concerns of possible aspiration by previous provider. Procalcitonin negative - recently afebrile - WBC elevated today, continue to follow  Essential hypertension, uncontrolled -Currently on Norvasc 10 mg daily -Increase hydralazine to 50 units 3 times daily  Urinary Retention Foley catheter removed, closely monitor him.  On bethanechol Documented as foley in place, will need to follow up  AKI, improved Creatinine starting to rise mildly, continue to monitor Baseline appears to be around 1.2  Diabetes type 2 with Hyperglycemia -Continue Accu-Cheks and sliding scale -NovoLog 2 units every 4 hours, continue bedtime Lantus  Dysphagia.  Plans for PEG tube placement today after aspirin and Plavix washout.  Tube feeds, aspirin and Plavix to be resumed once cleared by IR.  Continue to hold ASA/Plavix for now.  Normocytic Anemia -Hemoglobin is currently stable at 9.9.   Hx of Left MCA stroke with residual aphasia and right hemiparesis  -Aspirin Plavix on hold, Lipitor 80 mg daily.    Right arm swelling  -Likely from superficial thrombosis, recommend warm compresses  Abnormal LFT's, not worsening -In the setting of acute illness, continue to monitor   DVT prophylaxis:SCD Code Status: full  Family  Communication: none at bedside Disposition:   Status is: Inpatient  Remains inpatient appropriate because:Inpatient level of care appropriate due to severity of  illness   Dispo: The patient is from: SNF              Anticipated d/c is to: CIR              Anticipated d/c date is: > 3 days              Patient currently is not medically stable to d/c.  Consultants:   PCCM  neurology  Procedures:  PEG 9/7 ETT Trach 8/19   Antimicrobials: Anti-infectives (From admission, onward)   Start     Dose/Rate Route Frequency Ordered Stop   11/10/19 1815  ceFAZolin (ANCEF) IVPB 2g/100 mL premix        2 g 200 mL/hr over 30 Minutes Intravenous On call 11/10/19 1808 11/10/19 2104   11/10/19 1551  ceFAZolin (ANCEF) 2-4 GM/100ML-% IVPB       Note to Pharmacy: Yolanda Manges   : cabinet override      11/10/19 1551 11/10/19 1555   11/10/19 0000  ceFAZolin (ANCEF) IVPB 2g/100 mL premix        2 g 200 mL/hr over 30 Minutes Intravenous Once 11/09/19 1220 11/10/19 0027   10/30/19 1400  cefTRIAXone (ROCEPHIN) 2 g in sodium chloride 0.9 % 100 mL IVPB  Status:  Discontinued        2 g 200 mL/hr over 30 Minutes Intravenous Every 24 hours 10/30/19 1251 11/04/19 1358   10/18/19 2300  vancomycin (VANCOREADY) IVPB 750 mg/150 mL  Status:  Discontinued        750 mg 150 mL/hr over 60 Minutes Intravenous Every 24 hours 10/18/19 1359 10/19/19 1217   10/18/19 0200  vancomycin (VANCOREADY) IVPB 500 mg/100 mL  Status:  Discontinued        500 mg 100 mL/hr over 60 Minutes Intravenous Every 12 hours 10/17/19 1242 10/18/19 1359   10/17/19 1245  vancomycin (VANCOCIN) IVPB 1000 mg/200 mL premix        1,000 mg 200 mL/hr over 60 Minutes Intravenous  Once 10/17/19 1242 10/17/19 1441   10/17/19 1245  piperacillin-tazobactam (ZOSYN) IVPB 3.375 g        3.375 g 12.5 mL/hr over 240 Minutes Intravenous Every 8 hours 10/17/19 1242 10/24/19 2359   10/17/19 0900  ampicillin-sulbactam (UNASYN) 1.5 g in sodium chloride 0.9 % 100 mL IVPB  Status:  Discontinued        1.5 g 200 mL/hr over 30 Minutes Intravenous Every 6 hours 10/17/19 0809 10/17/19 0850   10/17/19 0900   Ampicillin-Sulbactam (UNASYN) 3 g in sodium chloride 0.9 % 100 mL IVPB  Status:  Discontinued        3 g 200 mL/hr over 30 Minutes Intravenous Every 6 hours 10/17/19 0850 10/17/19 1242   10/14/19 0600  vancomycin (VANCOREADY) IVPB 500 mg/100 mL  Status:  Discontinued        500 mg 100 mL/hr over 60 Minutes Intravenous Every 12 hours 10/14/19 0057 10/16/19 1534   10/12/19 1600  ampicillin (OMNIPEN) 2 g in sodium chloride 0.9 % 100 mL IVPB  Status:  Discontinued        2 g 300 mL/hr over 20 Minutes Intravenous Every 6 hours 10/12/19 1400 10/14/19 0934   10/12/19 0000  vancomycin (VANCOREADY) IVPB 750 mg/150 mL  Status:  Discontinued        750 mg 150 mL/hr over  60 Minutes Intravenous Every 12 hours 10/11/19 1254 10/14/19 0046   10/11/19 1230  cefTRIAXone (ROCEPHIN) 2 g in sodium chloride 0.9 % 100 mL IVPB  Status:  Discontinued        2 g 200 mL/hr over 30 Minutes Intravenous Every 12 hours 10/11/19 1133 10/16/19 1534   10/11/19 1230  ampicillin (OMNIPEN) 2 g in sodium chloride 0.9 % 100 mL IVPB  Status:  Discontinued        2 g 300 mL/hr over 20 Minutes Intravenous Every 4 hours 10/11/19 1133 10/12/19 1400   10/11/19 1200  vancomycin (VANCOREADY) IVPB 1500 mg/300 mL        1,500 mg 150 mL/hr over 120 Minutes Intravenous  Once 10/11/19 1135 10/11/19 1419   10/11/19 1145  vancomycin (VANCOCIN) IVPB 1000 mg/200 mL premix  Status:  Discontinued        1,000 mg 200 mL/hr over 60 Minutes Intravenous  Once 10/11/19 1133 10/11/19 1135      Subjective: Complains of nausea  Objective: Vitals:   11/11/19 0800 11/11/19 1120 11/11/19 1213 11/11/19 1513  BP:   (!) 157/77 (!) 155/72  Pulse:   83 88  Resp:   20 20  Temp:   98 F (36.7 C) 97.9 F (36.6 C)  TempSrc:   Axillary Axillary  SpO2: 93% (!) 88% 98% 96%  Weight:      Height:        Intake/Output Summary (Last 24 hours) at 11/11/2019 1941 Last data filed at 11/11/2019 1700 Gross per 24 hour  Intake 530 ml  Output 400 ml  Net 130 ml    Filed Weights   11/09/19 0416 11/10/19 0326 11/11/19 0500  Weight: 74.4 kg 72.7 kg 71.6 kg    Examination:  General exam: visibly uncomfortable, denies pain, acknowledges nausea Respiratory system: coarse breath sounds Cardiovascular system: S1 & S2 heard, RRR.  Gastrointestinal system: Abdomen is nondistended, soft and nontender.  PEG in place, NG in place. Central nervous system: Alert, appropriate.  Responds to questions appropriately. No focal neurological deficits. Extremities: no LEE Skin: No rashes, lesions or ulcers    Data Reviewed: I have personally reviewed following labs and imaging studies  CBC: Recent Labs  Lab 11/07/19 0314 11/08/19 0411 11/09/19 1019 11/10/19 1039 11/11/19 0330  WBC 8.9 7.6 9.6 12.5* 13.5*  HGB 9.4* 9.5* 10.2* 10.0* 10.0*  HCT 28.9* 28.8* 30.9* 31.2* 31.2*  MCV 90.6 91.7 91.2 90.7 91.0  PLT 381 376 414* 398 417*    Basic Metabolic Panel: Recent Labs  Lab 11/07/19 0314 11/08/19 0411 11/09/19 1019 11/10/19 1039 11/11/19 0330  NA 135 138 139 140 140  K 3.6 3.8 3.7 3.6 3.1*  CL 99 102 99 101 99  CO2 '28 28 28 27 29  ' GLUCOSE 119* 195* 138* 178* 97  BUN 28* 29* 32* 32* 35*  CREATININE 1.17 1.24 1.29* 1.27* 1.43*  CALCIUM 8.9 8.8* 9.2 9.5 9.4  MG 2.0 2.1 2.0 2.0 2.2    GFR: Estimated Creatinine Clearance: 56.3 mL/min (Sanah [place]) (by C-G formula based on SCr of 1.43 mg/dL (H)).  Liver Function Tests: Recent Labs  Lab 11/07/19 0314  ALBUMIN 2.7*    CBG: Recent Labs  Lab 11/11/19 0426 11/11/19 0759 11/11/19 1236 11/11/19 1616 11/11/19 1933  GLUCAP 121* 105* 142* 139* 131*     No results found for this or any previous visit (from the past 240 hour(s)).       Radiology Studies: DG Abd 1 View  Result  Date: 11/11/2019 CLINICAL DATA:  Shortness of breath, nausea, vomiting. History of hypertension diabetes, stroke, paralysis EXAM: ABDOMEN - 1 VIEW COMPARISON:  X-ray abdomen 10/26/2019, x-ray IR percutaneous placement  gastrostomy 11/10/2019 FINDINGS: Enteric tube noted coursing below the diaphragm with tip overlying the expected region of the gastric lumen. Gastrostomy tube with inflated balloon overlying the expected region of the gastric lumen. The shaped metallic density just superior to the gastrostomy tube balloon likely represents fasteners for the tube. The bowel gas pattern is normal. Stool throughout the colon. Possible trace free intraperitoneal gas overlying the right mid abdomen which is in keeping with recent percutaneous interventional radiology procedure. No radio-opaque calculi. Visualized osseous structures demonstrate no acute abnormality. Visualized bilateral lower hemi thoraces are unremarkable. IMPRESSION: Enteric tube and gastrostomy tube overlying the expected region of the gastric lumen. Nonobstructive bowel gas pattern with stool throughout the colon. Possible trace free intraperitoneal gas overlying the right mid abdomen which is in keeping with recent percutaneous interventional radiology procedure. Electronically Signed   By: Iven Finn M.D.   On: 11/11/2019 12:05   IR GASTROSTOMY TUBE MOD SED  Result Date: 11/10/2019 INDICATION: 59 year old male with dysphagia referred for image guided percutaneous gastrostomy EXAM: PERC PLACEMENT GASTROSTOMY MEDICATIONS: 2 g Ancef; Antibiotics were administered within 1 hour of the procedure. Scratch ANESTHESIA/SEDATION: Versed 1.0 mg IV; Fentanyl 50 mcg IV Moderate Sedation Time:  19 minutes The patient was continuously monitored during the procedure by the interventional radiology nurse under my direct supervision. CONTRAST:  10 cc into the lumen of the stomach FLUOROSCOPY TIME:  Fluoroscopy Time: 4 minutes 30 seconds (7 mGy). COMPLICATIONS: None PROCEDURE: Informed written consent was obtained from the patient and the patient's family after Hayden Mabin thorough discussion of the procedural risks, benefits and alternatives. All questions were addressed. Maximal  Sterile Barrier Technique was utilized including caps, mask, sterile gowns, sterile gloves, sterile drape, hand hygiene and skin antiseptic. Alexia Dinger timeout was performed prior to the initiation of the procedure. The epigastrium was prepped with Betadine in Katanya Schlie sterile fashion, and Georgia Delsignore sterile drape was applied covering the operative field. Babygirl Trager sterile gown and sterile gloves were used for the procedure. Nahzir Pohle 5-French orogastric tube is placed under fluoroscopic guidance. This accounted for the majority of the fluoroscopic time. The stomach was distended with gas. Under fluoroscopic guidance, an 18 gauge needle was utilized to puncture the anterior wall of the body of the stomach. An Amplatz wire was advanced through the needle passing Zyniah Ferraiolo T fastener into the lumen of the stomach. The T fastener was secured for gastropexy. Raymie Giammarco 9-French sheath was inserted. Maclin Guerrette snare was advanced through the 9-French sheath. Daneen Volcy Britta Mccreedy was advanced through the orogastric tube. It was snared then pulled out the oral cavity, pulling the snare, as well. The leading edge of the gastrostomy was attached to the snare. It was then pulled down the esophagus and out the percutaneous site. As the catheter was withdrawn through the puncture site of the abdominal wall, the leading last so of the gastrostomy broke. This was then manually grasped and withdrawn the remainder of the catheter, which also displaced the T tack. Tube was secured in place. Contrast was injected. Patient tolerated the procedure well and remained hemodynamically stable throughout. No complications were encountered and no significant blood loss encountered. IMPRESSION: Status post fluoroscopic placed percutaneous gastrostomy tube, with 20 [place] pull-through. Signed, Dulcy Fanny. Earleen Newport, DO [facility] Radiology Electronically Signed   By: Corrie Mckusick D.O.  On: 11/10/2019 16:58   DG CHEST PORT 1 VIEW  Result Date: 11/11/2019 CLINICAL DATA:   Shortness of breath with nausea and vomiting. EXAM: PORTABLE CHEST 1 VIEW COMPARISON:  Chest x-ray dated November 07, 2019. FINDINGS: Unchanged tracheostomy and feeding tubes. The heart size and mediastinal contours are within normal limits. Normal pulmonary vascularity. Improved aeration at the lung bases. No focal consolidation, pleural effusion, or pneumothorax. No acute osseous abnormality. IMPRESSION: 1. Improved aeration at the lung bases. No active cardiopulmonary disease. Electronically Signed   By: Titus Dubin M.D.   On: 11/11/2019 12:03        Scheduled Meds: . amLODipine  10 mg Per Tube Daily  . atorvastatin  80 mg Per Tube Daily  . bethanechol  15 mg Per Tube TID  . chlorhexidine gluconate (MEDLINE KIT)  15 mL Mouth Rinse BID  . Chlorhexidine Gluconate Cloth  6 each Topical Daily  . feeding supplement (PROSource TF)  45 mL Per Tube BID  . Gerhardt's butt cream   Topical Daily  . hydrALAZINE  50 mg Per Tube Q8H  . insulin aspart  0-15 Units Subcutaneous Q4H  . insulin aspart  2 Units Subcutaneous Q4H  . insulin glargine  12 Units Subcutaneous QHS  . mouth rinse  15 mL Mouth Rinse q12n4p  . neomycin-bacitracin-polymyxin  1 application Topical Daily  . phenytoin (DILANTIN) IV  100 mg Intravenous Q12H  . QUEtiapine  50 mg Per Tube QHS  . scopolamine  1 patch Transdermal Q72H  . sennosides  5 mL Per Tube BID   Continuous Infusions: . sodium chloride Stopped (10/15/19 1834)  . dextrose 5% lactated ringers    . feeding supplement (OSMOLITE 1.5 CAL) 1,000 mL (11/09/19 2330)  . lacosamide (VIMPAT) IV 150 mg (11/11/19 1313)  . levETIRAcetam 1,000 mg (11/11/19 1254)  . [START ON 11/12/2019] phenytoin (DILANTIN) IV    . potassium chloride 10 mEq (11/11/19 1847)     LOS: 31 days    Time spent: over 22 min    Fayrene Helper, MD [facility]   To contact the attending provider between 7A-7P or the covering provider during after hours 7P-7A, please log into the web  site [email or web address] and access using universal [place] password for that web site. If you do not have the password, please call the hospital operator.  11/11/2019, 7:41 PM

## 2019-11-11 NOTE — Plan of Care (Addendum)
  Problem: Activity: Goal: Ability to tolerate increased activity will improve Outcome: Progressing   Problem: Education: Goal: Knowledge of General Education information will improve Description: Including pain rating scale, medication(s)/side effects and non-pharmacologic comfort measures Outcome: Progressing Provided education to patient regarding transfer from chair/bed. Pateint able to transfer with one person assist, no c/o SOB.   Problem: Nutrition: Goal: Adequate nutrition will be maintained Outcome: Progressing Patien tNPO at this time due to vomiting episodies x3 since Peg was places.   Problem: Elimination: Goal: Will not experience complications related to urinary retention Outcome: Progressing  Patient with only 100 ml urine output. 700 ml out put from peg suction. MD notified.

## 2019-11-11 NOTE — Progress Notes (Signed)
Patient has an order for CT abdomen wo contrast, but need to have oral contrast through the cortrak. Previous RN reported that according to pt's MD, CT scan should be deferred till tomorrow after blood work on patient due to increased BUN, and CR. Radiology is  Holding on for now and confirm with MD in am when patient settles down. Will continue to monitor.

## 2019-11-11 NOTE — Progress Notes (Signed)
IR.  History of dysphasia secondary to CVA s/p percutaneous gastrostomy tube placement in IR 11/10/2019 by Dr. Loreta Ave.  Went to evaluate tube placement bedside. Patient laying in bed resting comfortably. He responds to voice but does not speak and does not follow simple commands. Gastrostomy tube site with small amount of dried blood under bumper; no active bleeding, drainage, or erythema noted. Bumper cinched to skin.  Gastrostomy tube stable- tube is ready for use. Please ensure bumper is cinched to skin to prevent leakage. Further plans per TRH/pulmonary- appreciate and agree with management. Please call IR with questions/concerns.   Waylan [place] Fritze, PA-C 11/11/2019, 10:32 AM

## 2019-11-11 NOTE — Progress Notes (Signed)
Rn called to room by nurse tech who observed patient vomiting. Emesis had large blobs of yellow/white phlegm in amongst brown liquid.  Patient with 500 ml out put of of brown liquid in suction cannister attached to PEG tube. Lungs diminished and with rhonchi on ausculation, O2 sats 93%. Rn asked patient if he had coughed a lot and then vomited, patient nodded head yes.  Trach suctioned and cleaned with assistance of RT, rhonchi decreased on auscultation and sat's increased to 96%. Rn then assessed abdomen. Patient nodded both yes and no when asked if he was nauseated, but then had an episode of dry heaving while MD was present, producing only mucus. zofran given and mouth care provided. KUB and CXR ordered, patient remaining NPO and seizure/blood pressure meds converted to IV pending outcome of testing. IR notified.

## 2019-11-12 ENCOUNTER — Encounter: Payer: BC Managed Care – PPO | Admitting: Occupational Therapy

## 2019-11-12 ENCOUNTER — Inpatient Hospital Stay (HOSPITAL_COMMUNITY): Payer: BC Managed Care – PPO

## 2019-11-12 ENCOUNTER — Encounter: Payer: BC Managed Care – PPO | Admitting: Speech Pathology

## 2019-11-12 LAB — CBC
HCT: 31.3 % — ABNORMAL LOW (ref 39.0–52.0)
Hemoglobin: 10.2 g/dL — ABNORMAL LOW (ref 13.0–17.0)
MCH: 29.9 pg (ref 26.0–34.0)
MCHC: 32.6 g/dL (ref 30.0–36.0)
MCV: 91.8 fL (ref 80.0–100.0)
Platelets: 410 10*3/uL — ABNORMAL HIGH (ref 150–400)
RBC: 3.41 MIL/uL — ABNORMAL LOW (ref 4.22–5.81)
RDW: 13.6 % (ref 11.5–15.5)
WBC: 14.3 10*3/uL — ABNORMAL HIGH (ref 4.0–10.5)
nRBC: 0 % (ref 0.0–0.2)

## 2019-11-12 LAB — GLUCOSE, CAPILLARY
Glucose-Capillary: 106 mg/dL — ABNORMAL HIGH (ref 70–99)
Glucose-Capillary: 127 mg/dL — ABNORMAL HIGH (ref 70–99)
Glucose-Capillary: 128 mg/dL — ABNORMAL HIGH (ref 70–99)
Glucose-Capillary: 152 mg/dL — ABNORMAL HIGH (ref 70–99)
Glucose-Capillary: 155 mg/dL — ABNORMAL HIGH (ref 70–99)
Glucose-Capillary: 165 mg/dL — ABNORMAL HIGH (ref 70–99)

## 2019-11-12 LAB — COMPREHENSIVE METABOLIC PANEL
ALT: 32 U/L (ref 0–44)
AST: 34 U/L (ref 15–41)
Albumin: 3 g/dL — ABNORMAL LOW (ref 3.5–5.0)
Alkaline Phosphatase: 173 U/L — ABNORMAL HIGH (ref 38–126)
Anion gap: 15 (ref 5–15)
BUN: 31 mg/dL — ABNORMAL HIGH (ref 6–20)
CO2: 25 mmol/L (ref 22–32)
Calcium: 9.1 mg/dL (ref 8.9–10.3)
Chloride: 104 mmol/L (ref 98–111)
Creatinine, Ser: 1.46 mg/dL — ABNORMAL HIGH (ref 0.61–1.24)
GFR calc Af Amer: >60 mL/min (ref >60)
GFR calc non Af Amer: 52 mL/min — ABNORMAL LOW (ref >60)
Glucose, Bld: 126 mg/dL — ABNORMAL HIGH (ref 70–99)
Potassium: 3 mmol/L — ABNORMAL LOW (ref 3.5–5.1)
Sodium: 144 mmol/L (ref 135–145)
Total Bilirubin: 0.5 mg/dL (ref 0.3–1.2)
Total Protein: 7.5 g/dL (ref 6.5–8.1)

## 2019-11-12 LAB — MAGNESIUM: Magnesium: 2.1 mg/dL (ref 1.7–2.4)

## 2019-11-12 IMAGING — CT CT ABD-PELV W/O CM
2 of 4 series · 16 of 46 positions shown, 18 images · non-contrast
Comparison: [DATE]

CLINICAL DATA: Nausea and vomiting, history type II diabetes
mellitus, hypertension, stroke

EXAM:
CT ABDOMEN AND PELVIS WITHOUT CONTRAST
TECHNIQUE: Multidetector CT imaging of the abdomen and pelvis was performed
following the standard protocol without IV contrast. Sagittal and
coronal MPR images reconstructed from axial data set. Dilute oral
contrast was administered.

[Series 3: a/p w/o 5mm · axial · non-contrast · 0.81mm/px · z∈[-738,-218]mm · 13 of 114 slices shown, 15 images]
[im 5/114  soft-tissue]
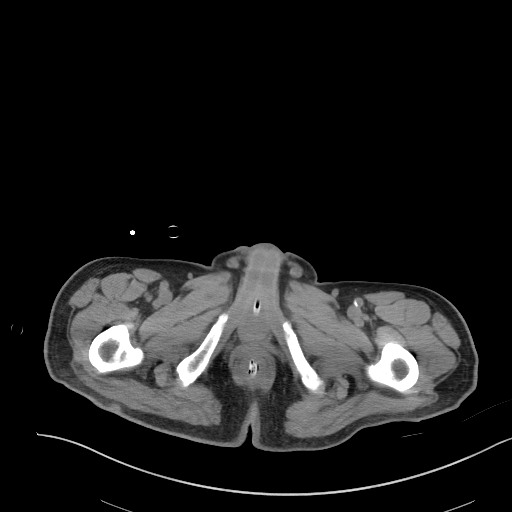
[im 5/114  bone]
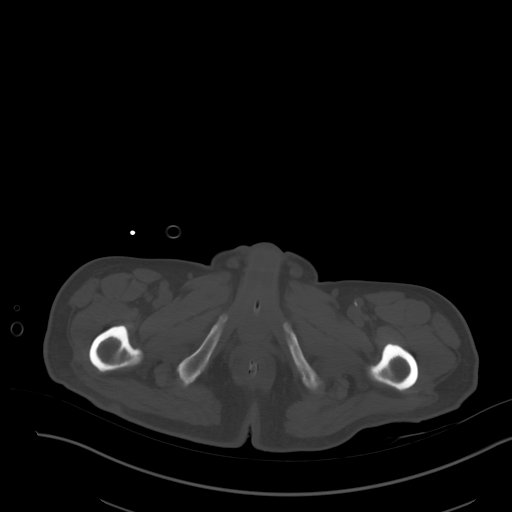
[im 15/114  soft-tissue]
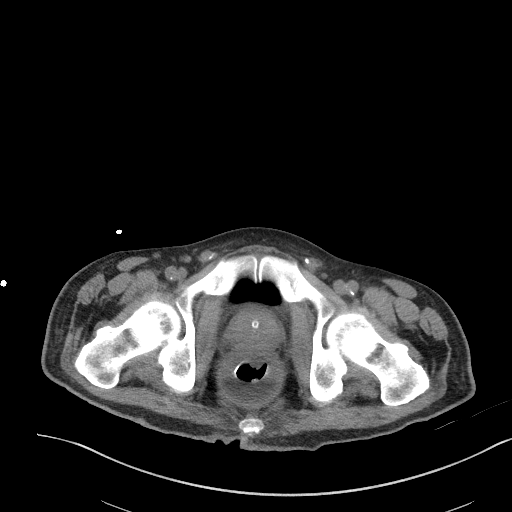
[im 25/114  soft-tissue]
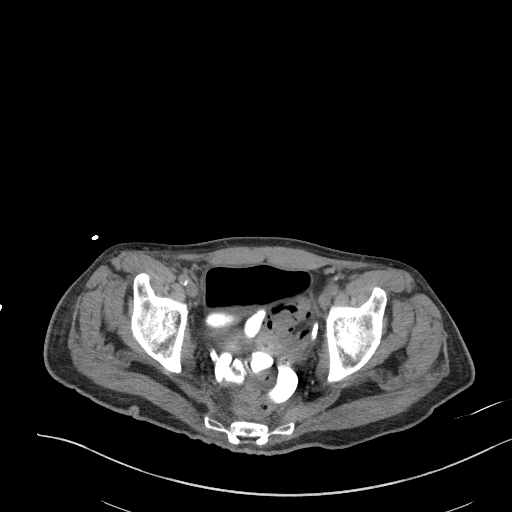
[im 30/114  soft-tissue]
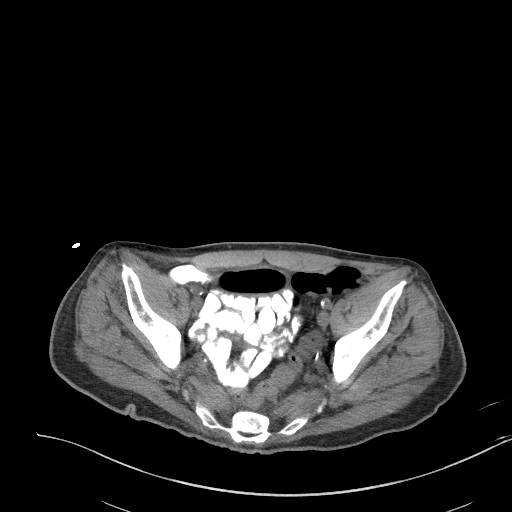
[im 40/114  soft-tissue]
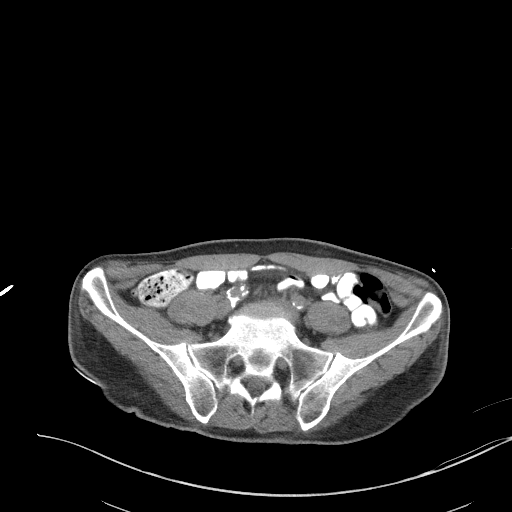
[im 50/114  soft-tissue]
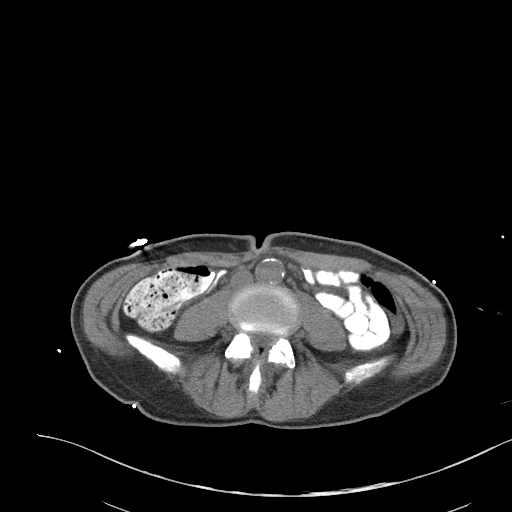
[im 59/114  soft-tissue]
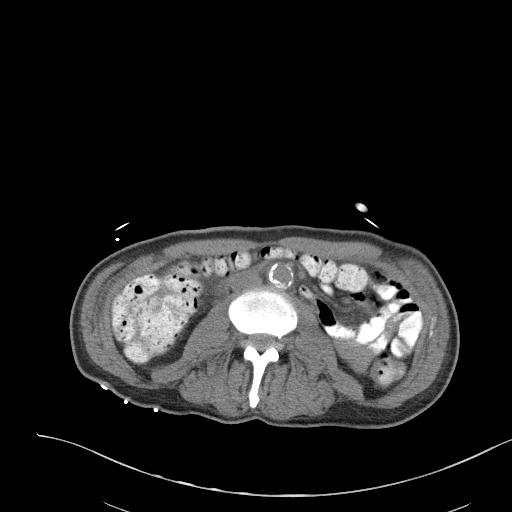
[im 64/114  soft-tissue]
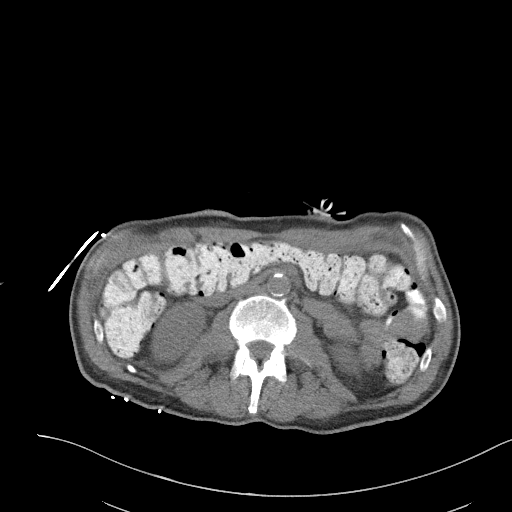
[im 74/114  soft-tissue]
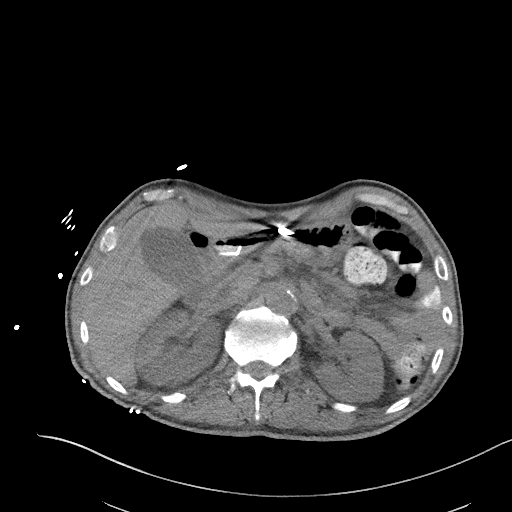
[im 74/114  bone]
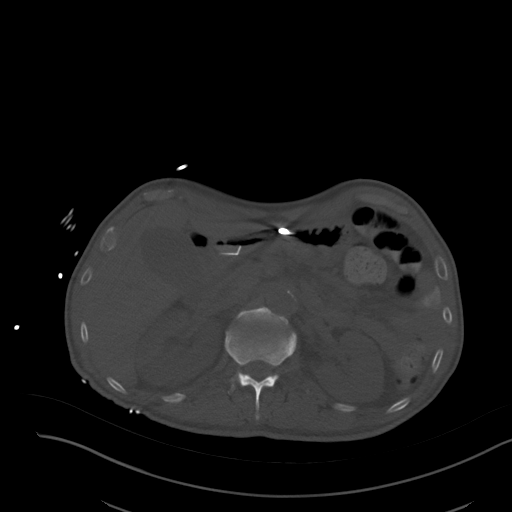
[im 84/114  soft-tissue]
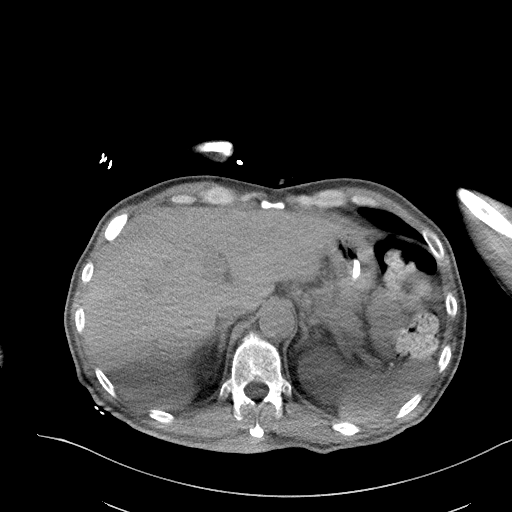
[im 89/114  soft-tissue]
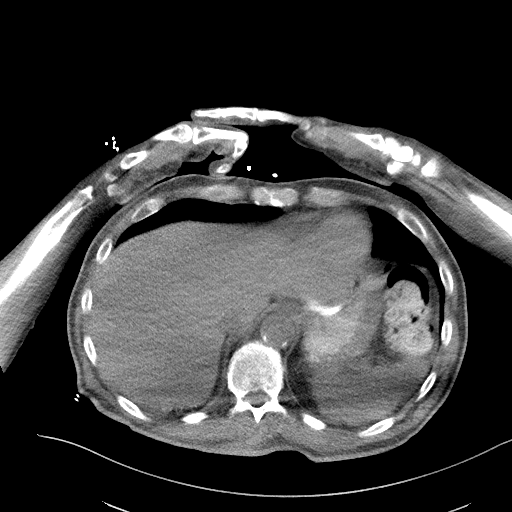
[im 99/114  soft-tissue]
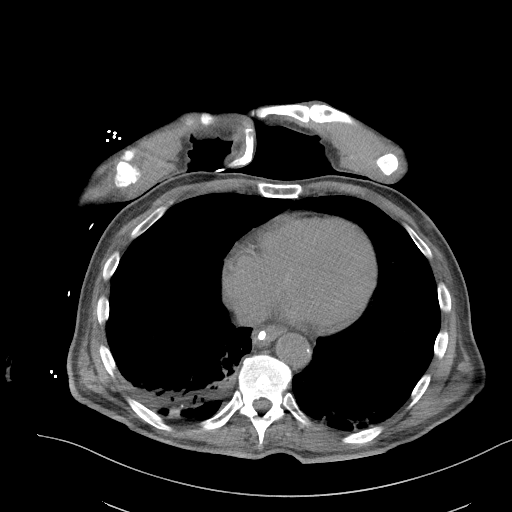
[im 109/114  soft-tissue]
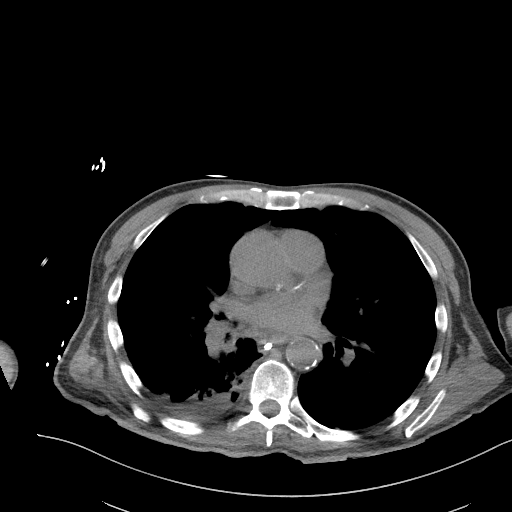

[Series 6: a/p w/o cor · coronal · non-contrast · 0.84mm/px · 3 of 146 slices shown]
[im 49/146  soft-tissue]
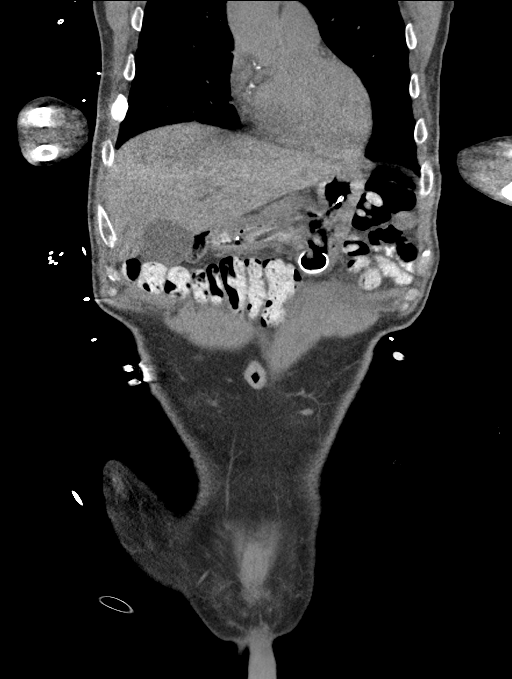
[im 65/146  soft-tissue]
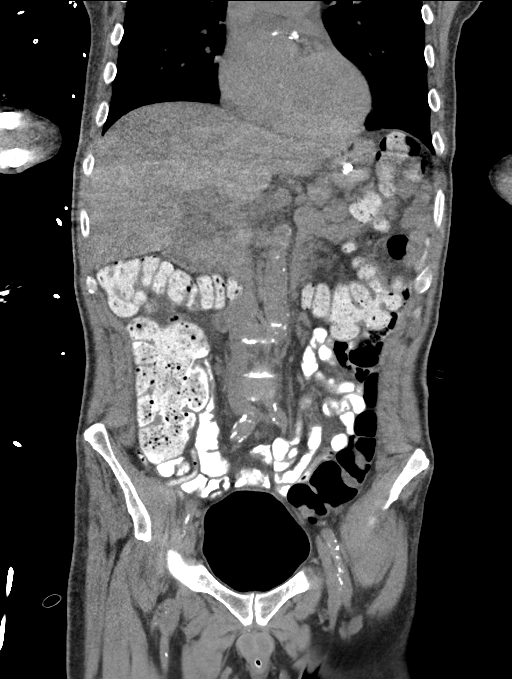
[im 81/146  soft-tissue]
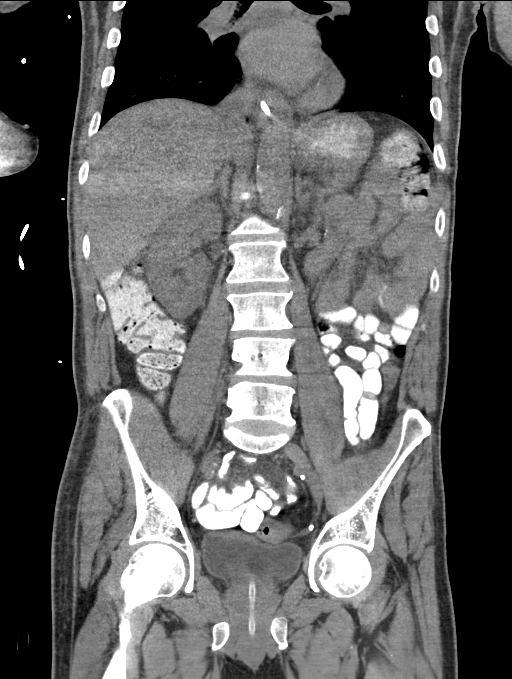

[16 of 46 positions shown; findings below may reference images not displayed]

FINDINGS: Lower chest: Minimal atelectasis at LEFT base. Atelectasis and/or
consolidation RIGHT lower lobe. Associated peribronchial thickening.

Hepatobiliary: Beam hardening artifacts from arms traverse liver. No
focal gallbladder or liver abnormalities.

Pancreas: Normal appearance

Spleen: Normal appearance within limitations of streak artifacts

Adrenals/Urinary Tract: Adrenal glands and kidneys normal
appearance. Foley catheter and air within urinary bladder. No
urinary tract calcification or dilatation.

Stomach/Bowel: Rectal tube. Normal appendix. Nasogastric tube in
stomach. Gastrostomy tube. Bowel loops unremarkable.

Vascular/Lymphatic: Atherosclerotic calcifications aorta and iliac
arteries. Aorta normal caliber. Coronary arterial calcifications
present. No adenopathy.

Reproductive: Mild prostatic enlargement. Vas deferens
calcification. Seminal vesicles unremarkable.

Other: No free air or free fluid. No hernia or acute inflammatory
process.

Musculoskeletal: Unremarkable
IMPRESSION: No acute intra-abdominal or intrapelvic abnormalities.

Mild prostatic enlargement.

Scattered atherosclerotic calcifications including coronary
arteries.

Atelectasis and/or consolidation RIGHT lower lobe with associated
bronchitic changes.

Aortic Atherosclerosis ([ER]-[ER]).

## 2019-11-12 MED ORDER — ENOXAPARIN SODIUM 40 MG/0.4ML ~~LOC~~ SOLN
40.0000 mg | SUBCUTANEOUS | Status: DC
  Administered 2019-11-12 – 2019-11-16 (×5): 40 mg via SUBCUTANEOUS
  Filled 2019-11-12 (×5): qty 0.4

## 2019-11-12 MED ORDER — IOHEXOL 9 MG/ML PO SOLN
ORAL | Status: AC
  Administered 2019-11-12: 500 mL
  Filled 2019-11-12: qty 1000

## 2019-11-12 MED ORDER — PHENYTOIN SODIUM 50 MG/ML IJ SOLN
100.0000 mg | INTRAMUSCULAR | Status: DC
  Administered 2019-11-13 (×2): 100 mg via INTRAVENOUS
  Filled 2019-11-12 (×2): qty 2

## 2019-11-12 MED ORDER — HYDRALAZINE HCL 20 MG/ML IJ SOLN
10.0000 mg | Freq: Three times a day (TID) | INTRAMUSCULAR | Status: DC
  Administered 2019-11-12 – 2019-11-14 (×7): 10 mg via INTRAVENOUS
  Filled 2019-11-12 (×7): qty 1

## 2019-11-12 MED ORDER — POTASSIUM CHLORIDE 10 MEQ/100ML IV SOLN
10.0000 meq | INTRAVENOUS | Status: AC
  Administered 2019-11-12 (×4): 10 meq via INTRAVENOUS
  Filled 2019-11-12 (×5): qty 100

## 2019-11-12 MED ORDER — OSMOLITE 1.5 CAL PO LIQD
1000.0000 mL | ORAL | Status: DC
  Administered 2019-11-12 – 2019-11-21 (×12): 1000 mL
  Filled 2019-11-12 (×10): qty 1000

## 2019-11-12 MED ORDER — DOXAZOSIN MESYLATE 1 MG PO TABS
1.0000 mg | ORAL_TABLET | Freq: Every day | ORAL | Status: DC
  Administered 2019-11-12 – 2019-11-21 (×10): 1 mg
  Filled 2019-11-12 (×10): qty 1

## 2019-11-12 MED ORDER — PROSOURCE TF PO LIQD
45.0000 mL | Freq: Two times a day (BID) | ORAL | Status: DC
  Administered 2019-11-13 – 2019-11-21 (×18): 45 mL
  Filled 2019-11-12 (×19): qty 45

## 2019-11-12 MED ORDER — OSMOLITE 1.2 CAL PO LIQD: 1000.0000 mL | ORAL | Status: DC

## 2019-11-12 MED ORDER — SODIUM CHLORIDE 0.9 % IV SOLN
150.0000 mg | INTRAVENOUS | Status: DC
  Administered 2019-11-12: 150 mg via INTRAVENOUS
  Filled 2019-11-12 (×2): qty 3

## 2019-11-12 NOTE — Progress Notes (Signed)
350 mls dark brown fluid emptied from suction canister.

## 2019-11-12 NOTE — Progress Notes (Signed)
PROGRESS NOTE    Corey Bennett  UXL:[id number] DOB: Sep 01, 1960 DOA: 10/10/2019 PCP: Patient, No Pcp Per   Chief Complaint  Patient presents with  . 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 Patient is living in [facility] facility and can ambulate by himself.  Due to concerns of seizure he was intubated for airway protection and taken to the ICU.  Eventually had tracheostomy placed on 8/23.  Since then he has had multiple swallowing evaluation which he continues to fail and progressing only very slowly.  He's now s/p PEG on 9/7.  Assessment & Plan:   Principal Problem:   Status epilepticus ([place]) Active Problems:   Carotid artery stenosis   Seizure (HCC)   CVA (cerebrovascular accident due to intracerebral hemorrhage) (HCC)/ left frontal lobe   AKI (acute kidney injury) ([place])   Essential hypertension   Type 2 diabetes mellitus with hyperlipidemia (HCC)  Acute on chronic chronic respiratory failure with hypoxemia requiring tracheostomy tube placement and mechanical ventilatory support. -Currently has tracheostomy in place.  Continue routine supportive care with frequent suctioning.  Advised to use incentive spirometer, Mucinex twice daily, bronchodilators scheduled twice daily and every 6 hours as needed. -Routine oral/tracheostomy care -Out of bed to chair. -Received 2 doses of IV Lasix on 9/5.  Improved his breathing  Nausea  Vomiting  S/p PEG Placement:  At least 2 episodes of clear brownish emesis since PEG placement yesterday Minimal output noted from peg this morning while I was rounding (350 cc emptied overnight) CT abdomen pelvis without acute intraabdominal abnormalities Seems like nausea/vomiting improving, will start tube feeds and follow Zofran prn   Status epilepticus in the  setting of recent large left MCA stroke -Continue Vimpat, Keppra and Dilantin. Low Dilantin levels therefore dose increased.  Pharmacy to recheck and adjust as appropriate - transition to IV while issues with peg above, continue IV for now, follow  Fever, improvedlikely in the setting of pneumonia with concern for aspiration, resolved -Concerns of possible aspiration by previous provider. Procalcitonin negative - recently afebrile - WBC elevated today, continue to follow -> he's afebrile - of note,   Essential hypertension, uncontrolled -Currently on Norvasc 10 mg daily -Increase hydralazine to 50 units 3 times daily  Urinary Retention Foley in place for 14 days, will start doxazosin, continue bethanecol  Trial of void today, follow post void bladder scan  AKI, improved Creatinine starting to rise mildly, continue to monitor Follow with IVF Baseline appears to be around 1.2  Diabetes type 2 with Hyperglycemia -Continue Accu-Cheks and sliding scale -NovoLog 2 units every 4 hours, continue bedtime Lantus  Dysphagia.  Plans for PEG tube placement today after aspirin and Plavix washout.  Tube feeds, aspirin and Plavix to be resumed once cleared by IR (will follow up with them to clarify).  Normocytic Anemia -Hemoglobin is currently stable  Hx of Left MCA stroke with residual aphasia and right hemiparesis  -Aspirin Plavix on hold, Lipitor 80 mg daily.    Right arm swelling  -Likely from superficial thrombosis, recommend warm compresses  Abnormal LFT's -In the setting of acute illness, continue to monitor   DVT prophylaxis:SCD -> lovenox Code Status: full  Family Communication: none at bedside - mother called, 9.9 -  Disposition:   Status is: Inpatient  Remains inpatient appropriate because:Inpatient level of care appropriate due to  severity of illness   Dispo: The patient is from: SNF              Anticipated d/c is to: CIR              Anticipated d/c date  is: > 3 days              Patient currently is not medically stable to d/c.  Consultants:   PCCM  neurology  Procedures:  PEG 9/7 ETT Trach 8/19   Antimicrobials: Anti-infectives (From admission, onward)   Start     Dose/Rate Route Frequency Ordered Stop   11/10/19 1815  ceFAZolin (ANCEF) IVPB 2g/100 mL premix        2 g 200 mL/hr over 30 Minutes Intravenous On call 11/10/19 1808 11/10/19 2104   11/10/19 1551  ceFAZolin (ANCEF) 2-4 GM/100ML-% IVPB       Note to Pharmacy: Yolanda Manges   : cabinet override      11/10/19 1551 11/10/19 1555   11/10/19 0000  ceFAZolin (ANCEF) IVPB 2g/100 mL premix        2 g 200 mL/hr over 30 Minutes Intravenous Once 11/09/19 1220 11/10/19 0027   10/30/19 1400  cefTRIAXone (ROCEPHIN) 2 g in sodium chloride 0.9 % 100 mL IVPB  Status:  Discontinued        2 g 200 mL/hr over 30 Minutes Intravenous Every 24 hours 10/30/19 1251 11/04/19 1358   10/18/19 2300  vancomycin (VANCOREADY) IVPB 750 mg/150 mL  Status:  Discontinued        750 mg 150 mL/hr over 60 Minutes Intravenous Every 24 hours 10/18/19 1359 10/19/19 1217   10/18/19 0200  vancomycin (VANCOREADY) IVPB 500 mg/100 mL  Status:  Discontinued        500 mg 100 mL/hr over 60 Minutes Intravenous Every 12 hours 10/17/19 1242 10/18/19 1359   10/17/19 1245  vancomycin (VANCOCIN) IVPB 1000 mg/200 mL premix        1,000 mg 200 mL/hr over 60 Minutes Intravenous  Once 10/17/19 1242 10/17/19 1441   10/17/19 1245  piperacillin-tazobactam (ZOSYN) IVPB 3.375 g        3.375 g 12.5 mL/hr over 240 Minutes Intravenous Every 8 hours 10/17/19 1242 10/24/19 2359   10/17/19 0900  ampicillin-sulbactam (UNASYN) 1.5 g in sodium chloride 0.9 % 100 mL IVPB  Status:  Discontinued        1.5 g 200 mL/hr over 30 Minutes Intravenous Every 6 hours 10/17/19 0809 10/17/19 0850   10/17/19 0900  Ampicillin-Sulbactam (UNASYN) 3 g in sodium chloride 0.9 % 100 mL IVPB  Status:  Discontinued        3 g 200 mL/hr over 30 Minutes  Intravenous Every 6 hours 10/17/19 0850 10/17/19 1242   10/14/19 0600  vancomycin (VANCOREADY) IVPB 500 mg/100 mL  Status:  Discontinued        500 mg 100 mL/hr over 60 Minutes Intravenous Every 12 hours 10/14/19 0057 10/16/19 1534   10/12/19 1600  ampicillin (OMNIPEN) 2 g in sodium chloride 0.9 % 100 mL IVPB  Status:  Discontinued        2 g 300 mL/hr over 20 Minutes Intravenous Every 6 hours 10/12/19 1400 10/14/19 0934   10/12/19 0000  vancomycin (VANCOREADY) IVPB 750 mg/150 mL  Status:  Discontinued        750 mg 150 mL/hr over 60 Minutes Intravenous Every 12 hours 10/11/19 1254 10/14/19 0046   10/11/19 1230  cefTRIAXone (ROCEPHIN) 2 g in sodium  chloride 0.9 % 100 mL IVPB  Status:  Discontinued        2 g 200 mL/hr over 30 Minutes Intravenous Every 12 hours 10/11/19 1133 10/16/19 1534   10/11/19 1230  ampicillin (OMNIPEN) 2 g in sodium chloride 0.9 % 100 mL IVPB  Status:  Discontinued        2 g 300 mL/hr over 20 Minutes Intravenous Every 4 hours 10/11/19 1133 10/12/19 1400   10/11/19 1200  vancomycin (VANCOREADY) IVPB 1500 mg/300 mL        1,500 mg 150 mL/hr over 120 Minutes Intravenous  Once 10/11/19 1135 10/11/19 1419   10/11/19 1145  vancomycin (VANCOCIN) IVPB 1000 mg/200 mL premix  Status:  Discontinued        1,000 mg 200 mL/hr over 60 Minutes Intravenous  Once 10/11/19 1133 10/11/19 1135      Subjective: Difficult to understand  Objective: Vitals:   11/12/19 1130 11/12/19 1243 11/12/19 1525 11/12/19 1608  BP: (!) 166/72 (!) 172/83 120/64   Pulse: 86 84  85  Resp: (!) 26 (!) 22 19 (!) 27  Temp:  97.8 F (36.6 C) 98.7 F (37.1 C)   TempSrc:  Oral Oral   SpO2: 96% 98%    Weight:      Height:        Intake/Output Summary (Last 24 hours) at 11/12/2019 1659 Last data filed at 11/12/2019 0400 Gross per 24 hour  Intake 1313.12 ml  Output 300 ml  Net 1013.12 ml   Filed Weights   11/10/19 0326 11/11/19 0500 11/12/19 0500  Weight: 72.7 kg 71.6 kg 70.8 kg     Examination:  General: No acute distress. Cardiovascular: Heart sounds show Salim Forero regular rate, and rhythm.  Lungs: coarse breath sounds with trach Abdomen: Soft, nontender, nondistended. Peg in place. Neurological: Alert.  Difficult to understand. Moves all extremities 4. Cranial nerves II through XII grossly intact. Skin: Warm and dry. No rashes or lesions. Extremities: No clubbing or cyanosis. No edema.    Data Reviewed: I have personally reviewed following labs and imaging studies  CBC: Recent Labs  Lab 11/08/19 0411 11/09/19 1019 11/10/19 1039 11/11/19 0330 11/12/19 1016  WBC 7.6 9.6 12.5* 13.5* 14.3*  HGB 9.5* 10.2* 10.0* 10.0* 10.2*  HCT 28.8* 30.9* 31.2* 31.2* 31.3*  MCV 91.7 91.2 90.7 91.0 91.8  PLT 376 414* 398 417* 410*    Basic Metabolic Panel: Recent Labs  Lab 11/08/19 0411 11/09/19 1019 11/10/19 1039 11/11/19 0330 11/12/19 1016  NA 138 139 140 140 144  K 3.8 3.7 3.6 3.1* 3.0*  CL 102 99 101 99 104  CO2 _0 GLUCOSE 195* 138* 178* 97 126*  BUN 29* 32* 32* 35* 31*  CREATININE 1.24 1.29* 1.27* 1.43* 1.46*  CALCIUM 8.8* 9.2 9.5 9.4 9.1  MG 2.1 2.0 2.0 2.2 2.1    GFR: Estimated Creatinine Clearance: 54.6 mL/min (Antrone [place]) (by C-G formula based on SCr of 1.46 mg/dL (H)).  Liver Function Tests: Recent Labs  Lab 11/07/19 0314 11/12/19 1016  AST  --  34  ALT  --  32  ALKPHOS  --  173*  BILITOT  --  0.5  PROT  --  7.5  ALBUMIN 2.7* 3.0*    CBG: Recent Labs  Lab 11/11/19 2310 11/12/19 0327 11/12/19 0817 11/12/19 1211 11/12/19 1522  GLUCAP 100* 152* 106* 128* 127*     No results found for this or any previous visit (from the past 240 hour(s)).  Radiology Studies: CT ABDOMEN PELVIS WO CONTRAST  Result Date: 11/12/2019 CLINICAL DATA:  Nausea and vomiting, history type II diabetes mellitus, hypertension, stroke EXAM: CT ABDOMEN AND PELVIS WITHOUT CONTRAST TECHNIQUE: Multidetector CT imaging of the abdomen and pelvis was  performed following the standard protocol without IV contrast. Sagittal and coronal MPR images reconstructed from axial data set. Dilute oral contrast was administered. COMPARISON:  10/20/2019 FINDINGS: Lower chest: Minimal atelectasis at LEFT base. Atelectasis and/or consolidation RIGHT lower lobe. Associated peribronchial thickening. Hepatobiliary: Beam hardening artifacts from arms traverse liver. No focal gallbladder or liver abnormalities. Pancreas: Normal appearance Spleen: Normal appearance within limitations of streak artifacts Adrenals/Urinary Tract: Adrenal glands and kidneys normal appearance. Foley catheter and air within urinary bladder. No urinary tract calcification or dilatation. Stomach/Bowel: Rectal tube. Normal appendix. Nasogastric tube in stomach. Gastrostomy tube. Bowel loops unremarkable. Vascular/Lymphatic: Atherosclerotic calcifications aorta and iliac arteries. Aorta normal caliber. Coronary arterial calcifications present. No adenopathy. Reproductive: Mild prostatic enlargement. Vas deferens calcification. Seminal vesicles unremarkable. Other: No free air or free fluid. No hernia or acute inflammatory process. Musculoskeletal: Unremarkable IMPRESSION: No acute intra-abdominal or intrapelvic abnormalities. Mild prostatic enlargement. Scattered atherosclerotic calcifications including coronary arteries. Atelectasis and/or consolidation RIGHT lower lobe with associated bronchitic changes. Aortic Atherosclerosis (ICD10-I70.0). Electronically Signed   By: Lavonia Dana M.D.   On: 11/12/2019 14:10   DG Abd 1 View  Result Date: 11/11/2019 CLINICAL DATA:  Shortness of breath, nausea, vomiting. History of hypertension diabetes, stroke, paralysis EXAM: ABDOMEN - 1 VIEW COMPARISON:  X-ray abdomen 10/26/2019, x-ray IR percutaneous placement gastrostomy 11/10/2019 FINDINGS: Enteric tube noted coursing below the diaphragm with tip overlying the expected region of the gastric lumen. Gastrostomy tube  with inflated balloon overlying the expected region of the gastric lumen. The shaped metallic density just superior to the gastrostomy tube balloon likely represents fasteners for the tube. The bowel gas pattern is normal. Stool throughout the colon. Possible trace free intraperitoneal gas overlying the right mid abdomen which is in keeping with recent percutaneous interventional radiology procedure. No radio-opaque calculi. Visualized osseous structures demonstrate no acute abnormality. Visualized bilateral lower hemi thoraces are unremarkable. IMPRESSION: Enteric tube and gastrostomy tube overlying the expected region of the gastric lumen. Nonobstructive bowel gas pattern with stool throughout the colon. Possible trace free intraperitoneal gas overlying the right mid abdomen which is in keeping with recent percutaneous interventional radiology procedure. Electronically Signed   By: Iven Finn M.D.   On: 11/11/2019 12:05   DG CHEST PORT 1 VIEW  Result Date: 11/11/2019 CLINICAL DATA:  Shortness of breath with nausea and vomiting. EXAM: PORTABLE CHEST 1 VIEW COMPARISON:  Chest x-ray dated November 07, 2019. FINDINGS: Unchanged tracheostomy and feeding tubes. The heart size and mediastinal contours are within normal limits. Normal pulmonary vascularity. Improved aeration at the lung bases. No focal consolidation, pleural effusion, or pneumothorax. No acute osseous abnormality. IMPRESSION: 1. Improved aeration at the lung bases. No active cardiopulmonary disease. Electronically Signed   By: Titus Dubin M.D.   On: 11/11/2019 12:03        Scheduled Meds: . amLODipine  10 mg Per Tube Daily  . atorvastatin  80 mg Per Tube Daily  . bethanechol  15 mg Per Tube TID  . chlorhexidine gluconate (MEDLINE KIT)  15 mL Mouth Rinse BID  . Chlorhexidine Gluconate Cloth  6 each Topical Daily  . feeding supplement (PROSource TF)  45 mL Per Tube BID  . Gerhardt's butt cream   Topical Daily  . hydrALAZINE  10 mg  Intravenous Q8H  . insulin aspart  0-15 Units Subcutaneous Q4H  . insulin aspart  2 Units Subcutaneous Q4H  . insulin glargine  12 Units Subcutaneous QHS  . mouth rinse  15 mL Mouth Rinse q12n4p  . neomycin-bacitracin-polymyxin  1 application Topical Daily  . phenytoin (DILANTIN) IV  100 mg Intravenous 2 times per day  . QUEtiapine  50 mg Per Tube QHS  . scopolamine  1 patch Transdermal Q72H  . sennosides  5 mL Per Tube BID   Continuous Infusions: . sodium chloride Stopped (10/15/19 1834)  . dextrose 5% lactated ringers 75 mL/hr at 11/12/19 1211  . famotidine (PEPCID) IV 20 mg (11/12/19 1045)  . feeding supplement (OSMOLITE 1.5 CAL) 1,000 mL (11/12/19 1632)  . lacosamide (VIMPAT) IV 150 mg (11/12/19 1123)  . levETIRAcetam 1,000 mg (11/12/19 0933)  . phenytoin (DILANTIN) IV 150 mg (11/12/19 1501)     LOS: 32 days    Time spent: over 30 min    Fayrene Helper, MD [facility]   To contact the attending provider between 7A-7P or the covering provider during after hours 7P-7A, please log into the web site [email or web address] and access using universal [place] password for that web site. If you do not have the password, please call the hospital operator.  11/12/2019, 4:59 PM

## 2019-11-12 NOTE — Progress Notes (Signed)
SLP Cancellation Note  Patient Details Name: Kleber Crean MRN: [id number] DOB: 1960-04-27   Cancelled treatment:       Reason Eval/Treat Not Completed: Other (comment) Will defer dysphagia therapy today as pt has plans for CT abdomen today due to emesis. Will f/u when pt able to participate.    Justan Gaede, Riley Nearing 11/12/2019, 8:44 AM

## 2019-11-12 NOTE — Progress Notes (Signed)
RT called regarding pt pulling trach out, when RT got to pts room RN had put trach back in place. Color change noted, vital sign stable. RN ordering spare trach. RT will continue to monitor throughout night.

## 2019-11-12 NOTE — Progress Notes (Signed)
Foley catheter discontinued per order. Patient tolerated well.

## 2019-11-12 NOTE — Progress Notes (Signed)
Nutrition Follow-up  DOCUMENTATION CODES:   Not applicable  INTERVENTION:   Initiate Tube Feeding via G-tube:  Osmolite 1.5at 3ml/hr(1320ml per day) Given recent episodes of N/V, initiate TF at 20 ml/hr; titrate by 10 mL q 8 hours until goal rate of 55 ml/hr Prosource TF18ml BID  Provides2060kcal, 105gm protein, free water daily  NUTRITION DIAGNOSIS:   Inadequate oral intake related to inability to eat as evidenced by NPO status.  Being addressed via TF   GOAL:   Patient will meet greater than or equal to 90% of their needs  Progressing   MONITOR:   Diet advancement, Labs, Weight trends, TF tolerance, Skin, I & O's  REASON FOR ASSESSMENT:   Ventilator    ASSESSMENT:   59 y.o. 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  He was intubated for airway protection and status and PCCM consulted for admission  8/11- extubated 8/12- s/p BSE- recommend continue NPO 8/13- cortrak tube placed (tip of tube in stomach), TF initiated 8/14- re-intubated; seizure activity 8/17- rectal tube placed 8/20 - trach placed 8/31- s/p MBSS- recommend continue NPO 9/07- IR placed 2F G-tube 9/08- Episodes of emesis post G-tube 9/09- CT abdomen negative for acute process  Spoke with RN who indicates MD requesting re-initiation of TF via G-tube. Pt had been experiencing N/V post-op but improved today and CT abdomen with no acute intra-abdominal issues.   Given recent N/V, plan to begin TF at low rate with titration orders in place  Labs: potassium 3.0 (L) Meds: D5-LR at 75 ml/hr, ss novolog  Diet Order:   Diet Order            Diet NPO time specified  Diet effective midnight                 EDUCATION NEEDS:   No education needs have been identified at this time  Skin:  Skin Assessment: Reviewed RN Assessment  Last BM:  11/08/19  Height:   Ht  Readings from Last 1 Encounters:  10/10/19 6\' 1"  (1.854 m)    Weight:   Wt Readings from Last 1 Encounters:  11/12/19 70.8 kg    Ideal Body Weight:  83.6 kg  BMI:  Body mass index is 20.59 kg/m.  Estimated Nutritional Needs:   Kcal:  1900-2100  Protein:  100-115 grams  Fluid:  > 2 L   01/12/20 MS, RDN, LDN, CNSC Registered Dietitian III Clinical Nutrition RD Pager and On-Call Pager Number Located in [place]

## 2019-11-12 NOTE — Progress Notes (Signed)
Physical Therapy Treatment Patient Details Name: Corey Bennett MRN: [id number] DOB: 02/20/1961 Today's Date: 11/12/2019    History of Present Illness 59 y.o. 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  He was intubated for airway protection on 10/10/19. Pt extubated on 10/14/19.  on 8/14, pt had continued twitching of Rt UE with AMS, and decreased 02 sats.  He was intubated.  Diagnosis: status epilepticus in setting of prior large Lt MCA infarct; acute kidney injury, possible acute meningitis.  He underwent tracheostomy on 8/19/2.  PMH includes: poorly controlled DM, dysphagia, HTN.  PEG  11/10/19.    PT Comments    Patient received in bed, agrees to PT session. Patient easily agitated and frustrated during session. Performed bed mobility with mod independence, transfers sit to stand with min guard. Requires assist to place right hand on walker, then min guard to walk 6 feet. Patient declined walking further at this time. He is difficult to communicate with as he does not give straight answers and then gets agitated when clarification is needed. He is able to do more than he is willing to do this session. He will continue to benefit from skilled PT while here to improve functional mobility, safety and strength.     Follow Up Recommendations  SNF     Equipment Recommendations  Wheelchair (measurements PT);Wheelchair cushion (measurements PT)    Recommendations for Other Services       Precautions / Restrictions Precautions Precautions: Fall Precaution Comments: trach, PEG, suction ( abdominal), rectal tube Restrictions Weight Bearing Restrictions: No    Mobility  Bed Mobility Overal bed mobility: Modified Independent Bed Mobility: Supine to Sit     Supine to sit: Modified independent (Device/Increase time)     General bed mobility comments: mod independent for mobility but requires  assistance to manage lines- easily agitated  Transfers Overall transfer level: Needs assistance Equipment used: Rolling walker (2 wheeled) Transfers: Sit to/from Stand Sit to Stand: Min guard            Ambulation/Gait Ambulation/Gait assistance: Min guard Gait Distance (Feet): 6 Feet Assistive device: Rolling walker (2 wheeled) Gait Pattern/deviations: Step-to pattern;Decreased step length - right;Narrow base of support Gait velocity: decr   General Gait Details: Patient ambulated with RW and min gaurd. Requires assist to get right hand onto walker. Narrow bos, decreased step length on left.   Stairs             Wheelchair Mobility    Modified Rankin (Stroke Patients Only)       Balance Overall balance assessment: Needs assistance Sitting-balance support: Feet supported Sitting balance-Leahy Scale: Good Sitting balance - Comments: supervision EOB, flexed upper trunk/cervical spine. Able to remove UE WB, but not challenges   Standing balance support: Bilateral upper extremity supported;During functional activity Standing balance-Leahy Scale: Fair Standing balance comment: requires min assist and support of RW                            Cognition Arousal/Alertness: Awake/alert Behavior During Therapy: Flat affect;Agitated Overall Cognitive Status: No family/caregiver present to determine baseline cognitive functioning Area of Impairment: Memory;Following commands;Safety/judgement;Awareness;Problem solving                       Following Commands: Follows one step commands inconsistently Safety/Judgement: Decreased awareness of safety;Decreased awareness of deficits  Awareness: Emergent Problem Solving: Difficulty sequencing;Requires verbal cues;Requires tactile cues General Comments: Patient easily agitated. Agrees to walk, but then after 6 feet wants to sit in recliner. Declines going further. Difficult to communicate with, doesn't give  straight yes/no answers      Exercises      General Comments        Pertinent Vitals/Pain Pain Assessment: Faces Pain Location: nods yes to pain but unable to clarify where    Home Living                      Prior Function            PT Goals (current goals can now be found in the care plan section) Acute Rehab PT Goals Patient Stated Goal: pt unable to state  Time For Goal Achievement: 11/25/19 Progress towards PT goals: Progressing toward goals    Frequency    Min 3X/week      PT Plan Current plan remains appropriate    Co-evaluation              AM-PAC PT "6 Clicks" Mobility   Outcome Measure  Help needed turning from your back to your side while in a flat bed without using bedrails?: None Help needed moving from lying on your back to sitting on the side of a flat bed without using bedrails?: A Little Help needed moving to and from a bed to a chair (including a wheelchair)?: A Little Help needed standing up from a chair using your arms (e.g., wheelchair or bedside chair)?: A Little Help needed to walk in hospital room?: A Little Help needed climbing 3-5 steps with a railing? : Total 6 Click Score: 17    End of Session Equipment Utilized During Treatment: Gait belt;Oxygen Activity Tolerance: Treatment limited secondary to agitation Patient left: in chair;with chair alarm set;with call bell/phone within reach Nurse Communication: Mobility status PT Visit Diagnosis: Other abnormalities of gait and mobility (R26.89);Other symptoms and signs involving the nervous system (R29.898);Muscle weakness (generalized) (M62.81);Unsteadiness on feet (R26.81);Difficulty in walking, not elsewhere classified (R26.2) Hemiplegia - Right/Left: Right Hemiplegia - caused by: Cerebral infarction     Time: 1410-1425 PT Time Calculation (min) (ACUTE ONLY): 15 min  Charges:  $Gait Training: 8-22 mins                     Kaamil Morefield, PT, GCS 11/12/19,2:54  PM

## 2019-11-13 ENCOUNTER — Inpatient Hospital Stay (HOSPITAL_COMMUNITY): Payer: BC Managed Care – PPO

## 2019-11-13 LAB — GLUCOSE, CAPILLARY
Glucose-Capillary: 136 mg/dL — ABNORMAL HIGH (ref 70–99)
Glucose-Capillary: 154 mg/dL — ABNORMAL HIGH (ref 70–99)
Glucose-Capillary: 157 mg/dL — ABNORMAL HIGH (ref 70–99)
Glucose-Capillary: 160 mg/dL — ABNORMAL HIGH (ref 70–99)
Glucose-Capillary: 169 mg/dL — ABNORMAL HIGH (ref 70–99)
Glucose-Capillary: 170 mg/dL — ABNORMAL HIGH (ref 70–99)
Glucose-Capillary: 173 mg/dL — ABNORMAL HIGH (ref 70–99)

## 2019-11-13 LAB — COMPREHENSIVE METABOLIC PANEL
ALT: 40 U/L (ref 0–44)
AST: 51 U/L — ABNORMAL HIGH (ref 15–41)
Albumin: 2.8 g/dL — ABNORMAL LOW (ref 3.5–5.0)
Alkaline Phosphatase: 165 U/L — ABNORMAL HIGH (ref 38–126)
Anion gap: 13 (ref 5–15)
BUN: 29 mg/dL — ABNORMAL HIGH (ref 6–20)
CO2: 26 mmol/L (ref 22–32)
Calcium: 8.7 mg/dL — ABNORMAL LOW (ref 8.9–10.3)
Chloride: 102 mmol/L (ref 98–111)
Creatinine, Ser: 1.47 mg/dL — ABNORMAL HIGH (ref 0.61–1.24)
GFR calc Af Amer: 60 mL/min — ABNORMAL LOW (ref >60)
GFR calc non Af Amer: 51 mL/min — ABNORMAL LOW (ref >60)
Glucose, Bld: 174 mg/dL — ABNORMAL HIGH (ref 70–99)
Potassium: 3.3 mmol/L — ABNORMAL LOW (ref 3.5–5.1)
Sodium: 141 mmol/L (ref 135–145)
Total Bilirubin: 0.5 mg/dL (ref 0.3–1.2)
Total Protein: 7.1 g/dL (ref 6.5–8.1)

## 2019-11-13 LAB — CBC
HCT: 29.8 % — ABNORMAL LOW (ref 39.0–52.0)
Hemoglobin: 9.8 g/dL — ABNORMAL LOW (ref 13.0–17.0)
MCH: 30.5 pg (ref 26.0–34.0)
MCHC: 32.9 g/dL (ref 30.0–36.0)
MCV: 92.8 fL (ref 80.0–100.0)
Platelets: 314 10*3/uL (ref 150–400)
RBC: 3.21 MIL/uL — ABNORMAL LOW (ref 4.22–5.81)
RDW: 13.9 % (ref 11.5–15.5)
WBC: 12.8 10*3/uL — ABNORMAL HIGH (ref 4.0–10.5)
nRBC: 0 % (ref 0.0–0.2)

## 2019-11-13 LAB — PHENYTOIN LEVEL, TOTAL: Phenytoin Lvl: 6.3 ug/mL — ABNORMAL LOW (ref 10.0–20.0)

## 2019-11-13 LAB — MAGNESIUM: Magnesium: 1.9 mg/dL (ref 1.7–2.4)

## 2019-11-13 LAB — PHOSPHORUS: Phosphorus: 2.5 mg/dL (ref 2.5–4.6)

## 2019-11-13 IMAGING — DX DG CHEST 1V PORT
1 series · 1 of 1 positions shown · non-contrast
Comparison: Two days ago.  Abdominal CT from yesterday

CLINICAL DATA: Abnormal CT of the abdomen

EXAM:
PORTABLE CHEST 1 VIEW

[chest ap]
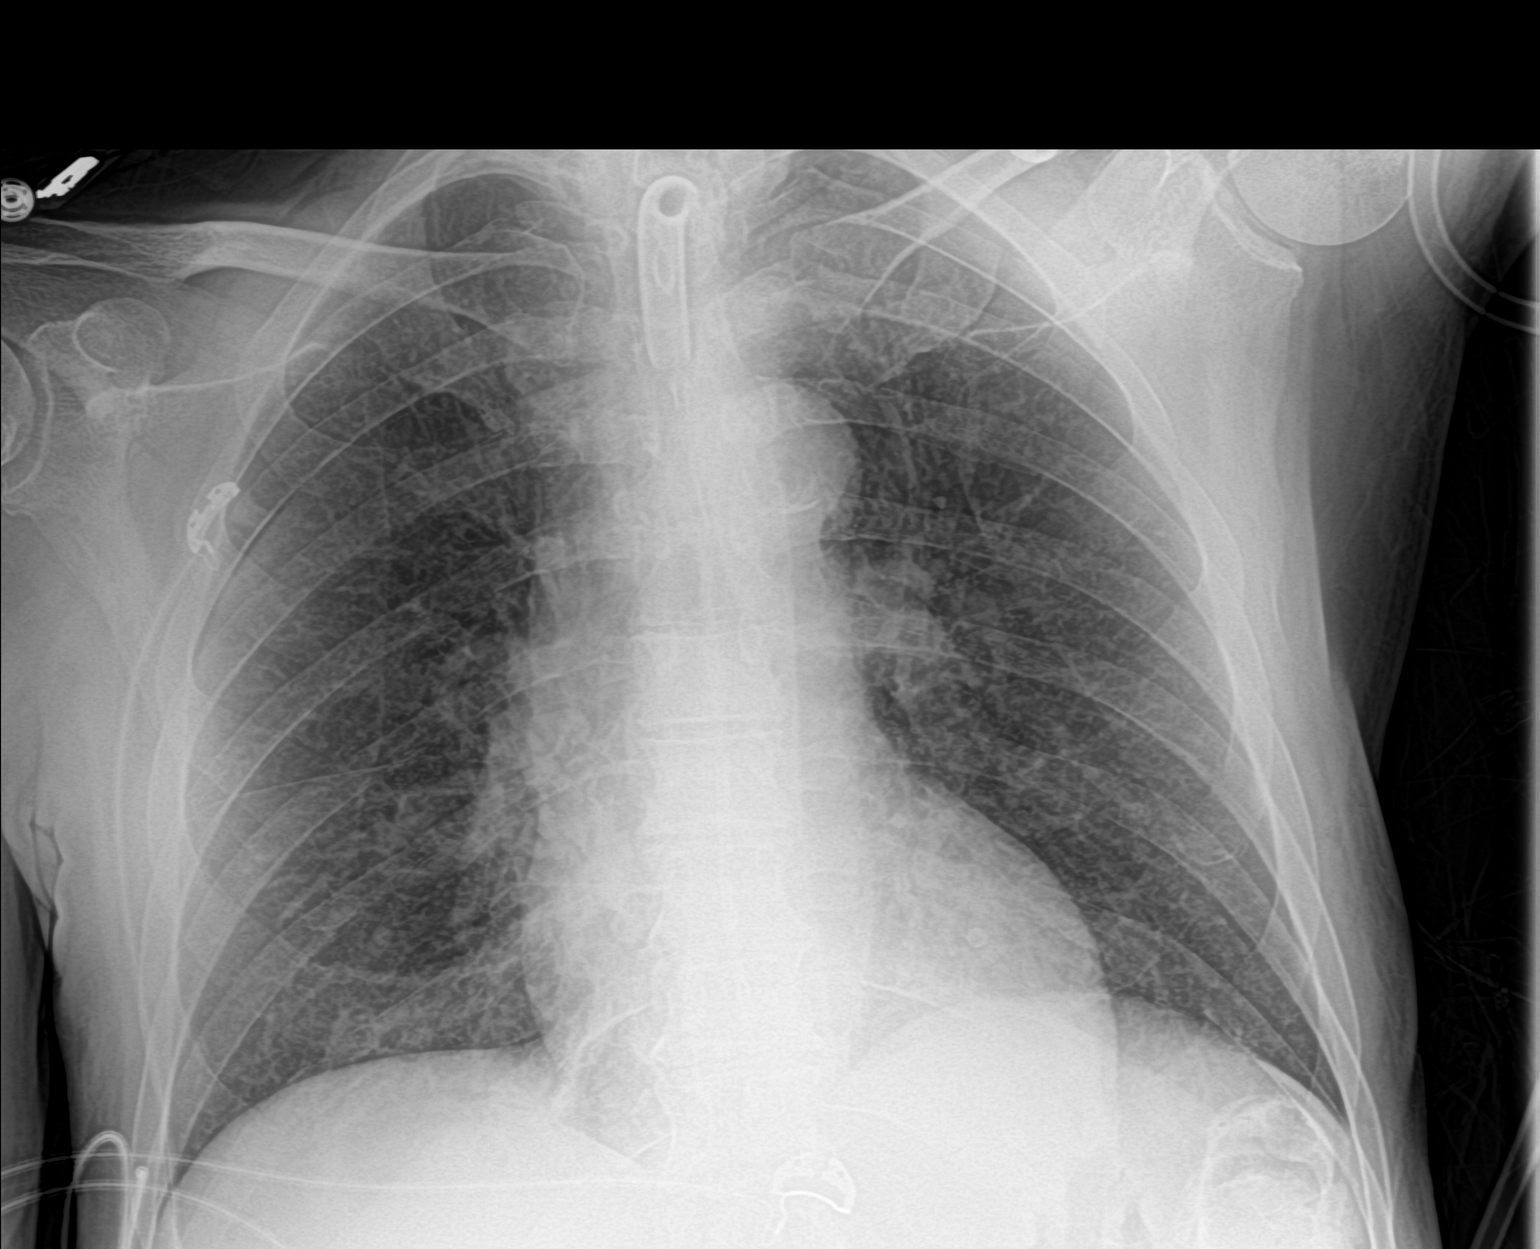

[1 of 1 positions shown; findings below may reference images not displayed]

FINDINGS: Interstitial coarsening that is generalized. Mild streaky density at
the right base, more prominent on CT comparison. No edema, effusion,
or pneumothorax. Tracheostomy tube in place. Normal heart size.
IMPRESSION: Atelectasis/infiltrate at the right base where there was extensive
endobronchial debris by recent CT.

## 2019-11-13 MED ORDER — PHENYTOIN 125 MG/5ML PO SUSP
175.0000 mg | Freq: Three times a day (TID) | ORAL | Status: DC
  Administered 2019-11-13 – 2019-11-21 (×23): 175 mg
  Filled 2019-11-13 (×25): qty 8

## 2019-11-13 MED ORDER — LACOSAMIDE 10 MG/ML PO SOLN
150.0000 mg | Freq: Two times a day (BID) | ORAL | Status: DC
  Administered 2019-11-13 – 2019-11-21 (×16): 150 mg
  Filled 2019-11-13 (×17): qty 15

## 2019-11-13 MED ORDER — ASPIRIN 81 MG PO CHEW
81.0000 mg | CHEWABLE_TABLET | Freq: Every day | ORAL | Status: DC
  Administered 2019-11-13 – 2019-11-21 (×9): 81 mg
  Filled 2019-11-13 (×9): qty 1

## 2019-11-13 MED ORDER — SODIUM CHLORIDE 0.9 % IV SOLN
250.0000 mg | Freq: Once | INTRAVENOUS | Status: AC
  Administered 2019-11-13: 250 mg via INTRAVENOUS
  Filled 2019-11-13: qty 5

## 2019-11-13 MED ORDER — POTASSIUM CHLORIDE 20 MEQ PO PACK
40.0000 meq | PACK | Freq: Once | ORAL | Status: AC
  Administered 2019-11-13: 40 meq
  Filled 2019-11-13: qty 2

## 2019-11-13 MED ORDER — SODIUM CHLORIDE 0.9 % IV SOLN
175.0000 mg | Freq: Three times a day (TID) | INTRAVENOUS | Status: DC
  Filled 2019-11-13: qty 3.5

## 2019-11-13 MED ORDER — CHLORHEXIDINE GLUCONATE CLOTH 2 % EX PADS
6.0000 | MEDICATED_PAD | Freq: Every day | CUTANEOUS | Status: DC
  Administered 2019-11-13 – 2019-11-21 (×9): 6 via TOPICAL

## 2019-11-13 MED ORDER — CLOPIDOGREL BISULFATE 75 MG PO TABS
75.0000 mg | ORAL_TABLET | Freq: Every day | ORAL | Status: DC
  Administered 2019-11-13 – 2019-11-21 (×9): 75 mg
  Filled 2019-11-13 (×9): qty 1

## 2019-11-13 MED ORDER — LEVETIRACETAM 100 MG/ML PO SOLN
1000.0000 mg | Freq: Two times a day (BID) | ORAL | Status: DC
  Administered 2019-11-13 – 2019-11-21 (×16): 1000 mg
  Filled 2019-11-13 (×16): qty 10

## 2019-11-13 NOTE — Progress Notes (Addendum)
MEDICATION RELATED CONSULT NOTE - FOLLOW UP   Pharmacy Consult for Phenytoin Indication: Seizures   No Known Allergies  Patient Measurements: Height: 6\' 1"  (185.4 cm) Weight: 70.8 kg (156 lb 1.4 oz) IBW/kg (Calculated) : 79.9  Vital Signs: Temp: 98.2 F (36.8 C) (09/10 0814) Temp Source: Oral (09/10 0814) BP: 125/57 (09/10 0814) Pulse Rate: 84 (09/10 0910) Intake/Output from previous day: 09/09 0701 - 09/10 0700 In: 204.3 [I.V.:4.3; IV Piggyback:200] Out: 400 [Urine:400] Intake/Output from this shift: No intake/output data recorded.  Labs: Recent Labs    11/11/19 0330 11/12/19 1016 11/13/19 0211  WBC 13.5* 14.3* 12.8*  HGB 10.0* 10.2* 9.8*  HCT 31.2* 31.3* 29.8*  PLT 417* 410* 314  CREATININE 1.43* 1.46* 1.47*  MG 2.2 2.1 1.9  PHOS  --   --  2.5  ALBUMIN  --  3.0* 2.8*  PROT  --  7.5 7.1  AST  --  34 51*  ALT  --  32 40  ALKPHOS  --  173* 165*  BILITOT  --  0.5 0.5   Estimated Creatinine Clearance: 54.2 mL/min (A) (by C-G formula based on SCr of 1.47 mg/dL (H)).    Assessment: 59 yo M admitted with seizures. Last seizure noted 8/14  - 8/7 loaded with DPH (goal 15-20) and Keppra - 8/9 Keppra dose reduced d/t renal fxn 750mg  IV q12h > 8/13 incr back to 1000mg  q12h  8/7 DPH 1500mg  IV x1 then 100mg  Q8H (~4 mg/kg/d based on TBW; IBW 80 kg) 8/8 DPH level (post-load): 20.1 >> no adjustments, cont current dosing 8/11 DPH 14.1, albumin 4.1 8/12 DPH 13.4.  Vimpat and Onfi added. 8/13 DPH 12.6, Albumin 2.5, corrected 21. Vimpat and Keppra increased d/t improved CrCl 8/15 DPH 7.5 > corrected = 12.5, free level ~0.95 >> 150mg  x1 then resume 100 Q8hr, renal fx worsening 8/17 DPH 4.3 (Corrects to 9.35 based on albumin from 8/18) - continue current dose per MD  8/18: Phenytoin level 3 adjusts to 8.4 8/24: Switched from IV to PO  8/30: Phenytoin level 3.5 adjusted to 6 for albumin and renal function (goal 15-20)  9/4: Phenytoin level 3.8 adjusted to 5.9 for albumin and  renal function  Phenytoin level dropped significantly after switching to per tube administration due to binding by continuous tube feeds. Would be difficult to hold tube feeds 2hr before and after each dose. Could switch to nocturnal feeds but would have to increase TF rate significantly to 131ml/hr to get the same nutrition.   9/4 update: Discussed with MD to increase dose vs switch back to IV.  Per MD, increase dose and recheck levels. Dose increased, no reported seizures.  Will follow levels  Pt is still on phenytoin along with lacosamide and levetiracetam for his seizures. His corrected level was around 9.5 after dose was a little lower after switching to IV due to PEG issue and N/V. We will give a small bolus and dose change to previous dose.   Goal of Therapy:  Phenytoin level 15-20 Seizure control  Plan:  Phenytoin 250mg  IV x1 phenytoin to 175 mg IV q8 (~7mg /kg/day based on protocol stating to inc TDD no more than 100mg /day), adjust to goal range of 15-20   F/u with another level in 4 days Change to per tube once PEG and N/V resolved  9/18, PharmD, [place], AAHIVP, CPP Infectious Disease Pharmacist 11/13/2019 9:23 AM  Addendum:  11/4 to change antiepileptics to per tube today per Dr 9m.  Phenytoin 175mg  per tube  TID Lacosamide 150mg  per tube BID  Levetiracetam 1000mg  per tube BID  , PharmD, BCIDP, AAHIVP, CPP Infectious Disease Pharmacist 11/13/2019 12:40 PM

## 2019-11-13 NOTE — Progress Notes (Signed)
PT Cancellation Note  Patient Details Name: Corey Bennett MRN: [id number] DOB: [birth year]   Cancelled Treatment:    Reason Eval/Treat Not Completed: Patient declined, no reason specified Will continue to follow and see as he allows.   Brittanie Dosanjh 11/13/2019, 11:51 AM

## 2019-11-13 NOTE — Progress Notes (Signed)
Inpatient Rehab Admissions Coordinator:   Met with pt at bedside.  He keeps his eyes closed, and doesn't interact with me much or answer many of my questions.  Note that PEG no longer to suction.  Also note PT change of recommendations to SNF.  Will see how patient does over the next 1-2 therapy sessions and follow for improvement.   Shann Medal, PT, DPT Admissions Coordinator [phone] 11/13/19  11:39 AM

## 2019-11-13 NOTE — Progress Notes (Signed)
Occupational Therapy Treatment Patient Details Name: Corey Bennett MRN: [id number] DOB: May 12, 1960 Today's Date: 11/13/2019    History of present illness 59 y.o. 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  He was intubated for airway protection on 10/10/19. Pt extubated on 10/14/19.  on 8/14, pt had continued twitching of Rt UE with AMS, and decreased 02 sats.  He was intubated.  Diagnosis: status epilepticus in setting of prior large Lt MCA infarct; acute kidney injury, possible acute meningitis.  He underwent tracheostomy on 8/19/2.  PMH includes: poorly controlled DM, dysphagia, HTN.  PEG  11/10/19.   OT comments  Pt. Seen for skilled OT.  Keeping eyes closed throughout session bout would nod yes/no direction and also intermittently answer yes/no.  Agreeable to ROM BUE with focus on contracture prevention and increasing strength and use. Grimacing noted with RUE ROM and adjusted within pt. Tolerance.  Will cont. With poc for increasing independence and safety with ADLS.   Follow Up Recommendations  Supervision/Assistance - 24 hour;CIR    Equipment Recommendations  None recommended by OT    Recommendations for Other Services Rehab consult    Precautions / Restrictions Precautions Precautions: Fall Precaution Comments: trach, PEG, suction ( abdominal), rectal tube       Mobility Bed Mobility                  Transfers                      Balance                                           ADL either performed or assessed with clinical judgement   ADL                                               Vision       Perception     Praxis      Cognition                                                Exercises General Exercises - Upper Extremity Shoulder Flexion: PROM;Left;10 reps;Supine Shoulder  Extension: PROM;Left;10 reps;Supine Elbow Flexion: PROM;Both;Supine;10 reps Elbow Extension: PROM;Both;Supine;10 reps Digit Composite Flexion: PROM;Right;10 reps;Supine Composite Extension: PROM;Right;10 reps;Supine Other Exercises Other Exercises: PROM of R UE from shoulder to hand   Shoulder Instructions       General Comments      Pertinent Vitals/ Pain          Home Living                                          Prior Functioning/Environment              Frequency  Min 2X/week        Progress Toward Goals  OT Goals(current goals can now be found in the care plan  section)  Progress towards OT goals: Progressing toward goals     Plan Discharge plan remains appropriate    Co-evaluation                 AM-PAC OT "6 Clicks" Daily Activity     Outcome Measure   Help from another person eating meals?: Total Help from another person taking care of personal grooming?: A Lot Help from another person toileting, which includes using toliet, bedpan, or urinal?: A Lot Help from another person bathing (including washing, rinsing, drying)?: A Lot Help from another person to put on and taking off regular upper body clothing?: A Lot Help from another person to put on and taking off regular lower body clothing?: A Lot 6 Click Score: 11    End of Session    OT Visit Diagnosis: Hemiplegia and hemiparesis;Cognitive communication deficit (R41.841) Symptoms and signs involving cognitive functions: Cerebral infarction Hemiplegia - Right/Left: Right Hemiplegia - dominant/non-dominant: Dominant Hemiplegia - caused by: Cerebral infarction   Activity Tolerance Patient tolerated treatment well   Patient Left in bed;with call bell/phone within reach;with bed alarm set   Nurse Communication          Time: 0347-4259 OT Time Calculation (min): 9 min  Charges: OT General Charges $OT Visit: 1 Visit OT Treatments $Therapeutic Exercise: 8-22  mins  Boneta Lucks, COTA/L Acute Rehabilitation [phone]   [name] 11/13/2019, 1:26 PM

## 2019-11-13 NOTE — Progress Notes (Signed)
Pt has not voided during shift and since I&O cath this am by previous nurse. Pt bladder scanned for . MD notified and new order received. Reported off to oncoming RN. Dionne Bucy RN

## 2019-11-13 NOTE — Progress Notes (Signed)
PROGRESS NOTE    Corey Bennett  HDQ:[id number] DOB: 1960-08-09 DOA: 10/10/2019 PCP: Patient, No Pcp Per   Chief Complaint  Patient presents with  . 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 [facility] and can ambulate by himself.  Due to concerns of seizure he was intubated for airway protection and taken to the ICU.  Eventually had tracheostomy placed on 8/23.  Since then he has had multiple swallowing evaluation which he continues to fail and progressing only very slowly.  He's now s/p PEG on 9/7.  Plan for CIR at this time.  Assessment & Plan:   Principal Problem:   Status epilepticus ([place]) Active Problems:   Carotid artery stenosis   Seizure (HCC)   CVA (cerebrovascular accident due to intracerebral hemorrhage) (HCC)/ left frontal lobe   AKI (acute kidney injury) ([place])   Essential hypertension   Type 2 diabetes mellitus with hyperlipidemia (HCC)  Acute on chronic chronic respiratory failure with hypoxemia requiring tracheostomy tube placement and mechanical ventilatory support. -Currently has tracheostomy in place.  Continue routine supportive care with frequent suctioning.  Advised to use incentive spirometer, Mucinex twice daily, bronchodilators scheduled twice daily and every 6 hours as needed. -Routine oral/tracheostomy care -Out of bed to chair.  Nausea  Vomiting: Resolved CT abdomen pelvis without acute intraabdominal abnormalities zofran prn  S/p PEG Placement:  Continue tube feed  Status epilepticus in the setting of recent large left MCA stroke -Continue Vimpat, Keppra and Dilantin. - pharmacy adjusting dilantin levels  Fever, improvedlikely in the setting of pneumonia with concern for aspiration, resolved -Concerns of possible aspiration  by previous provider. Procalcitonin negative - recently afebrile - WBC elevated today, continue to follow -> he's afebrile, but has evidence of continued aspiration pneumonitis/pneumonia -> continue to monitor, consider abx if febrile or worsening resp status - CXR 9/10 with atelectasis/infitrate at R base (where endobronchial debris by recent CT) - CT abd/pelvis from 9/9 with atelectasis and/or consolidation of R lower lobe with bronchitic changes  Essential hypertension, uncontrolled -Currently on Norvasc 10 mg daily -Increase hydralazine to 50 units 3 times daily  Urinary Retention will start doxazosin, continue bethanecol  Failed TOV 9/9 - 9/10 -> replace foley   AKI, improved Creatinine starting to rise mildly, continue to monitor Follow with IVF Baseline appears to be around 1.2  Diabetes type 2 with Hyperglycemia -Continue Accu-Cheks and sliding scale -NovoLog 2 units every 4 hours, continue bedtime Lantus  Dysphagia.  S/p peg placement  Normocytic Anemia -Hemoglobin is currently stable  Hx of Left MCA stroke with residual aphasia and right hemiparesis  -Aspirin Plavix, Lipitor 80 mg daily.    Right arm swelling  -Likely from superficial thrombosis, recommend warm compresses  Abnormal LFT's -In the setting of acute illness, continue to monitor  DVT prophylaxis:SCD -> lovenox Code Status: full  Family Communication: none at bedside - mother called, 9.9  Disposition:   Status is: Inpatient  Remains inpatient appropriate because:Inpatient level of care appropriate due to severity of illness   Dispo: The patient is from: SNF              Anticipated d/c is to: CIR              Anticipated d/c date is: > 3 days  Patient currently is not medically stable to d/c.  Consultants:   PCCM  neurology  Procedures:  PEG 9/7 ETT Trach 8/19   Antimicrobials: Anti-infectives (From admission, onward)   Start     Dose/Rate Route Frequency  Ordered Stop   11/10/19 1815  ceFAZolin (ANCEF) IVPB 2g/100 mL premix        2 g 200 mL/hr over 30 Minutes Intravenous On call 11/10/19 1808 11/10/19 2104   11/10/19 1551  ceFAZolin (ANCEF) 2-4 GM/100ML-% IVPB       Note to Pharmacy: Yolanda Manges   : cabinet override      11/10/19 1551 11/10/19 1555   11/10/19 0000  ceFAZolin (ANCEF) IVPB 2g/100 mL premix        2 g 200 mL/hr over 30 Minutes Intravenous Once 11/09/19 1220 11/10/19 0027   10/30/19 1400  cefTRIAXone (ROCEPHIN) 2 g in sodium chloride 0.9 % 100 mL IVPB  Status:  Discontinued        2 g 200 mL/hr over 30 Minutes Intravenous Every 24 hours 10/30/19 1251 11/04/19 1358   10/18/19 2300  vancomycin (VANCOREADY) IVPB 750 mg/150 mL  Status:  Discontinued        750 mg 150 mL/hr over 60 Minutes Intravenous Every 24 hours 10/18/19 1359 10/19/19 1217   10/18/19 0200  vancomycin (VANCOREADY) IVPB 500 mg/100 mL  Status:  Discontinued        500 mg 100 mL/hr over 60 Minutes Intravenous Every 12 hours 10/17/19 1242 10/18/19 1359   10/17/19 1245  vancomycin (VANCOCIN) IVPB 1000 mg/200 mL premix        1,000 mg 200 mL/hr over 60 Minutes Intravenous  Once 10/17/19 1242 10/17/19 1441   10/17/19 1245  piperacillin-tazobactam (ZOSYN) IVPB 3.375 g        3.375 g 12.5 mL/hr over 240 Minutes Intravenous Every 8 hours 10/17/19 1242 10/24/19 2359   10/17/19 0900  ampicillin-sulbactam (UNASYN) 1.5 g in sodium chloride 0.9 % 100 mL IVPB  Status:  Discontinued        1.5 g 200 mL/hr over 30 Minutes Intravenous Every 6 hours 10/17/19 0809 10/17/19 0850   10/17/19 0900  Ampicillin-Sulbactam (UNASYN) 3 g in sodium chloride 0.9 % 100 mL IVPB  Status:  Discontinued        3 g 200 mL/hr over 30 Minutes Intravenous Every 6 hours 10/17/19 0850 10/17/19 1242   10/14/19 0600  vancomycin (VANCOREADY) IVPB 500 mg/100 mL  Status:  Discontinued        500 mg 100 mL/hr over 60 Minutes Intravenous Every 12 hours 10/14/19 0057 10/16/19 1534   10/12/19 1600   ampicillin (OMNIPEN) 2 g in sodium chloride 0.9 % 100 mL IVPB  Status:  Discontinued        2 g 300 mL/hr over 20 Minutes Intravenous Every 6 hours 10/12/19 1400 10/14/19 0934   10/12/19 0000  vancomycin (VANCOREADY) IVPB 750 mg/150 mL  Status:  Discontinued        750 mg 150 mL/hr over 60 Minutes Intravenous Every 12 hours 10/11/19 1254 10/14/19 0046   10/11/19 1230  cefTRIAXone (ROCEPHIN) 2 g in sodium chloride 0.9 % 100 mL IVPB  Status:  Discontinued        2 g 200 mL/hr over 30 Minutes Intravenous Every 12 hours 10/11/19 1133 10/16/19 1534   10/11/19 1230  ampicillin (OMNIPEN) 2 g in sodium chloride 0.9 % 100 mL IVPB  Status:  Discontinued        2 g  300 mL/hr over 20 Minutes Intravenous Every 4 hours 10/11/19 1133 10/12/19 1400   10/11/19 1200  vancomycin (VANCOREADY) IVPB 1500 mg/300 mL        1,500 mg 150 mL/hr over 120 Minutes Intravenous  Once 10/11/19 1135 10/11/19 1419   10/11/19 1145  vancomycin (VANCOCIN) IVPB 1000 mg/200 mL premix  Status:  Discontinued        1,000 mg 200 mL/hr over 60 Minutes Intravenous  Once 10/11/19 1133 10/11/19 1135      Subjective: No complaints today Difficult to understand  Objective: Vitals:   11/13/19 1133 11/13/19 1353 11/13/19 1621 11/13/19 1700  BP: 125/81 (!) 117/52 122/60   Pulse: 79  83 84  Resp: (!) _0 Temp: 97.8 F (36.6 C)  98.1 F (36.7 C)   TempSrc: Oral  Oral   SpO2: 100%  98% 99%  Weight:      Height:        Intake/Output Summary (Last 24 hours) at 11/13/2019 1917 Last data filed at 11/13/2019 1500 Gross per 24 hour  Intake 1129.7 ml  Output 400 ml  Net 729.7 ml   Filed Weights   11/10/19 0326 11/11/19 0500 11/12/19 0500  Weight: 72.7 kg 71.6 kg 70.8 kg    Examination:  General: No acute distress. Cardiovascular: Heart sounds show Juston Goheen regular rate, and rhythm Lungs: coarse transmitted upper airway sounds Abdomen: Soft, nontender, nondistended Neurological: Alert. Moves all extremities 4 . Cranial  nerves II through XII grossly intact. Skin: Warm and dry. No rashes or lesions. Extremities: No clubbing or cyanosis. No edema.     Data Reviewed: I have personally reviewed following labs and imaging studies  CBC: Recent Labs  Lab 11/09/19 1019 11/10/19 1039 11/11/19 0330 11/12/19 1016 11/13/19 0211  WBC 9.6 12.5* 13.5* 14.3* 12.8*  HGB 10.2* 10.0* 10.0* 10.2* 9.8*  HCT 30.9* 31.2* 31.2* 31.3* 29.8*  MCV 91.2 90.7 91.0 91.8 92.8  PLT 414* 398 417* 410* 762    Basic Metabolic Panel: Recent Labs  Lab 11/09/19 1019 11/10/19 1039 11/11/19 0330 11/12/19 1016 11/13/19 0211  NA 139 140 140 144 141  K 3.7 3.6 3.1* 3.0* 3.3*  CL 99 101 99 104 102  CO2 _1 GLUCOSE 138* 178* 97 126* 174*  BUN 32* 32* 35* 31* 29*  CREATININE 1.29* 1.27* 1.43* 1.46* 1.47*  CALCIUM 9.2 9.5 9.4 9.1 8.7*  MG 2.0 2.0 2.2 2.1 1.9  PHOS  --   --   --   --  2.5    GFR: Estimated Creatinine Clearance: 54.2 mL/min (Kevontay Burks) (by C-G formula based on SCr of 1.47 mg/dL (H)).  Liver Function Tests: Recent Labs  Lab 11/07/19 0314 11/12/19 1016 11/13/19 0211  AST  --  34 51*  ALT  --  32 40  ALKPHOS  --  173* 165*  BILITOT  --  0.5 0.5  PROT  --  7.5 7.1  ALBUMIN 2.7* 3.0* 2.8*    CBG: Recent Labs  Lab 11/13/19 0417 11/13/19 0813 11/13/19 1135 11/13/19 1347 11/13/19 1621  GLUCAP 173* 170* 136* 154* 157*     No results found for this or any previous visit (from the past 240 hour(s)).       Radiology Studies: CT ABDOMEN PELVIS WO CONTRAST  Result Date: 11/12/2019 CLINICAL DATA:  Nausea and vomiting, history type II diabetes mellitus, hypertension, stroke EXAM: CT ABDOMEN AND PELVIS WITHOUT CONTRAST TECHNIQUE: Multidetector CT imaging of the abdomen and  pelvis was performed following the standard protocol without IV contrast. Sagittal and coronal MPR images reconstructed from axial data set. Dilute oral contrast was administered. COMPARISON:  10/20/2019 FINDINGS: Lower chest:  Minimal atelectasis at LEFT base. Atelectasis and/or consolidation RIGHT lower lobe. Associated peribronchial thickening. Hepatobiliary: Beam hardening artifacts from arms traverse liver. No focal gallbladder or liver abnormalities. Pancreas: Normal appearance Spleen: Normal appearance within limitations of streak artifacts Adrenals/Urinary Tract: Adrenal glands and kidneys normal appearance. Foley catheter and air within urinary bladder. No urinary tract calcification or dilatation. Stomach/Bowel: Rectal tube. Normal appendix. Nasogastric tube in stomach. Gastrostomy tube. Bowel loops unremarkable. Vascular/Lymphatic: Atherosclerotic calcifications aorta and iliac arteries. Aorta normal caliber. Coronary arterial calcifications present. No adenopathy. Reproductive: Mild prostatic enlargement. Vas deferens calcification. Seminal vesicles unremarkable. Other: No free air or free fluid. No hernia or acute inflammatory process. Musculoskeletal: Unremarkable IMPRESSION: No acute intra-abdominal or intrapelvic abnormalities. Mild prostatic enlargement. Scattered atherosclerotic calcifications including coronary arteries. Atelectasis and/or consolidation RIGHT lower lobe with associated bronchitic changes. Aortic Atherosclerosis (ICD10-I70.0). Electronically Signed   By: Lavonia Dana M.D.   On: 11/12/2019 14:10   DG CHEST PORT 1 VIEW  Result Date: 11/13/2019 CLINICAL DATA:  Abnormal CT of the abdomen EXAM: PORTABLE CHEST 1 VIEW COMPARISON:  Two days ago.  Abdominal CT from yesterday FINDINGS: Interstitial coarsening that is generalized. Mild streaky density at the right base, more prominent on CT comparison. No edema, effusion, or pneumothorax. Tracheostomy tube in place. Normal heart size. IMPRESSION: Atelectasis/infiltrate at the right base where there was extensive endobronchial debris by recent CT. Electronically Signed   By: Monte Fantasia M.D.   On: 11/13/2019 06:38        Scheduled Meds: . amLODipine   10 mg Per Tube Daily  . aspirin  81 mg Per Tube Daily  . atorvastatin  80 mg Per Tube Daily  . bethanechol  15 mg Per Tube TID  . chlorhexidine gluconate (MEDLINE KIT)  15 mL Mouth Rinse BID  . clopidogrel  75 mg Per Tube Daily  . doxazosin  1 mg Per Tube Daily  . enoxaparin (LOVENOX) injection  40 mg Subcutaneous Q24H  . feeding supplement (PROSource TF)  45 mL Per Tube BID  . Gerhardt's butt cream   Topical Daily  . hydrALAZINE  10 mg Intravenous Q8H  . insulin aspart  0-15 Units Subcutaneous Q4H  . insulin aspart  2 Units Subcutaneous Q4H  . insulin glargine  12 Units Subcutaneous QHS  . lacosamide  150 mg Per Tube BID  . levETIRAcetam  1,000 mg Per Tube BID  . mouth rinse  15 mL Mouth Rinse q12n4p  . neomycin-bacitracin-polymyxin  1 application Topical Daily  . phenytoin  175 mg Per Tube TID  . QUEtiapine  50 mg Per Tube QHS  . scopolamine  1 patch Transdermal Q72H  . sennosides  5 mL Per Tube BID   Continuous Infusions: . sodium chloride 10 mL/hr at 11/13/19 1500  . famotidine (PEPCID) IV Stopped (11/13/19 1050)  . feeding supplement (OSMOLITE 1.5 CAL) 1,000 mL (11/13/19 1701)     LOS: 33 days    Time spent: over 30 min    Fayrene Helper, MD [facility]   To contact the attending provider between 7A-7P or the covering provider during after hours 7P-7A, please log into the web site [email or web address] and access using universal [place] password for that web site. If you do not have the password, please call the hospital operator.  11/13/2019, 7:17 PM

## 2019-11-13 NOTE — Progress Notes (Signed)
SLP Cancellation Note  Patient Details Name: Corey Bennett MRN: [id number] DOB: 10-16-1960   Cancelled treatment:       Reason Eval/Treat Not Completed: Patient at procedure or test/unavailable (Pt working with OT at this time. SLP will follow up. )  Anyeli Hockenbury I. Vear Clock, MS, CCC-SLP Acute Rehabilitation Services Office number [phone] Pager [phone]  Scheryl Marten 11/13/2019, 3:27 PM

## 2019-11-14 LAB — PHOSPHORUS: Phosphorus: 3.2 mg/dL (ref 2.5–4.6)

## 2019-11-14 LAB — COMPREHENSIVE METABOLIC PANEL WITH GFR
ALT: 48 U/L — ABNORMAL HIGH (ref 0–44)
AST: 52 U/L — ABNORMAL HIGH (ref 15–41)
Albumin: 2.8 g/dL — ABNORMAL LOW (ref 3.5–5.0)
Alkaline Phosphatase: 144 U/L — ABNORMAL HIGH (ref 38–126)
Anion gap: 10 (ref 5–15)
BUN: 27 mg/dL — ABNORMAL HIGH (ref 6–20)
CO2: 24 mmol/L (ref 22–32)
Calcium: 8.6 mg/dL — ABNORMAL LOW (ref 8.9–10.3)
Chloride: 106 mmol/L (ref 98–111)
Creatinine, Ser: 1.38 mg/dL — ABNORMAL HIGH (ref 0.61–1.24)
GFR calc Af Amer: >60 mL/min (ref >60)
GFR calc non Af Amer: 56 mL/min — ABNORMAL LOW (ref >60)
Glucose, Bld: 167 mg/dL — ABNORMAL HIGH (ref 70–99)
Potassium: 3.5 mmol/L (ref 3.5–5.1)
Sodium: 140 mmol/L (ref 135–145)
Total Bilirubin: 0.4 mg/dL (ref 0.3–1.2)
Total Protein: 6.9 g/dL (ref 6.5–8.1)

## 2019-11-14 LAB — MAGNESIUM: Magnesium: 1.9 mg/dL (ref 1.7–2.4)

## 2019-11-14 LAB — CBC WITH DIFFERENTIAL/PLATELET
Abs Immature Granulocytes: 0.1 K/uL — ABNORMAL HIGH (ref 0.00–0.07)
Basophils Absolute: 0.1 K/uL (ref 0.0–0.1)
Basophils Relative: 1 %
Eosinophils Absolute: 0.2 K/uL (ref 0.0–0.5)
Eosinophils Relative: 2 %
HCT: 27.7 % — ABNORMAL LOW (ref 39.0–52.0)
Hemoglobin: 8.8 g/dL — ABNORMAL LOW (ref 13.0–17.0)
Immature Granulocytes: 1 %
Lymphocytes Relative: 14 %
Lymphs Abs: 1.4 K/uL (ref 0.7–4.0)
MCH: 29.9 pg (ref 26.0–34.0)
MCHC: 31.8 g/dL (ref 30.0–36.0)
MCV: 94.2 fL (ref 80.0–100.0)
Monocytes Absolute: 0.7 K/uL (ref 0.1–1.0)
Monocytes Relative: 7 %
Neutro Abs: 7.7 K/uL (ref 1.7–7.7)
Neutrophils Relative %: 75 %
Platelets: 295 K/uL (ref 150–400)
RBC: 2.94 MIL/uL — ABNORMAL LOW (ref 4.22–5.81)
RDW: 14.2 % (ref 11.5–15.5)
WBC: 10.2 K/uL (ref 4.0–10.5)
nRBC: 0 % (ref 0.0–0.2)

## 2019-11-14 LAB — GLUCOSE, CAPILLARY
Glucose-Capillary: 102 mg/dL — ABNORMAL HIGH (ref 70–99)
Glucose-Capillary: 114 mg/dL — ABNORMAL HIGH (ref 70–99)
Glucose-Capillary: 179 mg/dL — ABNORMAL HIGH (ref 70–99)
Glucose-Capillary: 130 mg/dL — ABNORMAL HIGH (ref 70–99)
Glucose-Capillary: 116 mg/dL — ABNORMAL HIGH (ref 70–99)

## 2019-11-14 MED ORDER — HYDRALAZINE HCL 50 MG PO TABS
50.0000 mg | ORAL_TABLET | Freq: Three times a day (TID) | ORAL | Status: DC
  Administered 2019-11-14 – 2019-11-21 (×21): 50 mg
  Filled 2019-11-14 (×21): qty 1

## 2019-11-14 MED ORDER — MORPHINE SULFATE (PF) 2 MG/ML IV SOLN
2.0000 mg | Freq: Once | INTRAVENOUS | Status: AC
  Administered 2019-11-14: 2 mg via INTRAVENOUS
  Filled 2019-11-14: qty 1

## 2019-11-14 NOTE — Progress Notes (Signed)
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 Patient is living in a assisted living facility and can ambulate by himself. Due to concerns of seizure he was intubated for airway protection and taken to the ICU. Eventually had tracheostomy placed on 8/23. Since then he has had multiple swallowing evaluation which he continues to fail and progressing only very slowly. He's now s/p PEG on 9/7.  Plan for CIR at this time.  Assessment & Plan:   Principal Problem:   Status epilepticus ([place]) Active Problems:   Carotid artery stenosis   Seizure (HCC)   CVA (cerebrovascular accident due to intracerebral hemorrhage) (HCC)/ left frontal lobe   AKI (acute kidney injury) ([place])   Essential hypertension   Type 2 diabetes mellitus with hyperlipidemia (HCC)   Acute on chronic chronic respiratory failure with hypoxemia requiring tracheostomy tube placement and mechanical ventilatory support. -Currently has tracheostomy in place. Continue routine supportive care with frequent suctioning. Advised to use incentive spirometer, Mucinex twice daily, bronchodilators scheduled twice daily and every 6 hours as needed. -Routine oral/tracheostomy care -Cont out of bed to chair.  Nausea  Vomiting: Resolved CT abdomen pelvis without acute intraabdominal abnormalities zofran prn  S/p PEG Placement:  Continue tube feed as tolerated  Status epilepticus in the setting of recent large left MCA stroke -Continue Vimpat, Keppra and Dilantin. - pharmacy adjusting dilantin levels  Fever, improvedlikely in the setting of pneumonia with concern for aspiration, resolved -Concerns of possible aspiration by previous provider. Procalcitonin  negative - recently afebrile - WBC trending down -> he's afebrile, but has evidence of continued aspiration pneumonitis/pneumonia -> continue to monitor, consider abx if febrile or worsening resp status - CXR 9/10 with atelectasis/infitrate at R base (where endobronchial debris by recent CT) - CT abd/pelvis from 9/9 with atelectasis and/or consolidation of R lower lobe with bronchitic changes  Essential hypertension, uncontrolled -Currently on Norvasc 10 mg daily -continued on hydralazine to 50 units 3 times daily -bp improved  Urinary Retention will start doxazosin, continue bethanecol  Failed TOV 9/9 - 9/10 -> continue foley   AKI, improved Creatinine recently starting to rise mildly, continue to monitor Renal function now improved Baseline appears to be around 1.2 Repeat bmet in AM  Diabetes type 2 with Hyperglycemia -Continue Accu-Cheks and sliding scale -NovoLog 2 units every 4 hours,continue bedtime Lantus -Glycemic trends currently stable  Dysphagia. S/p peg placement  Normocytic Anemia -Hemoglobin is currently stable  Hx of Left MCA stroke with residual aphasia and right hemiparesis  -Continue with Aspirin Plavix, Lipitor 80 mg daily.   Right arm swelling  -Likely from superficial thrombosis, recommend warm compresses  Abnormal LFT's -In the setting of acute illness, continue to monitor -stable at this time, cont to follow  DVT prophylaxis: Lovenox subq Code Status: Full Family Communication: Pt in room, family not at bedside  Status is: Inpatient  Remains inpatient appropriate because:Unsafe d/c plan   Dispo: The patient is from: Home              Anticipated d/c is to: SNF              Anticipated d/c date is: 3 days              Patient currently is  not medically stable to d/c.  Consultants:   PCCM  Neurology  Procedures:  PEG 9/7 ETT Trach 8/19  Antimicrobials: Anti-infectives (From admission, onward)   Start     Dose/Rate  Route Frequency Ordered Stop   11/10/19 1815  ceFAZolin (ANCEF) IVPB 2g/100 mL premix        2 g 200 mL/hr over 30 Minutes Intravenous On call 11/10/19 1808 11/10/19 2104   11/10/19 1551  ceFAZolin (ANCEF) 2-4 GM/100ML-% IVPB       Note to Pharmacy: Yolanda Manges   : cabinet override      11/10/19 1551 11/10/19 1555   11/10/19 0000  ceFAZolin (ANCEF) IVPB 2g/100 mL premix        2 g 200 mL/hr over 30 Minutes Intravenous Once 11/09/19 1220 11/10/19 0027   10/30/19 1400  cefTRIAXone (ROCEPHIN) 2 g in sodium chloride 0.9 % 100 mL IVPB  Status:  Discontinued        2 g 200 mL/hr over 30 Minutes Intravenous Every 24 hours 10/30/19 1251 11/04/19 1358   10/18/19 2300  vancomycin (VANCOREADY) IVPB 750 mg/150 mL  Status:  Discontinued        750 mg 150 mL/hr over 60 Minutes Intravenous Every 24 hours 10/18/19 1359 10/19/19 1217   10/18/19 0200  vancomycin (VANCOREADY) IVPB 500 mg/100 mL  Status:  Discontinued        500 mg 100 mL/hr over 60 Minutes Intravenous Every 12 hours 10/17/19 1242 10/18/19 1359   10/17/19 1245  vancomycin (VANCOCIN) IVPB 1000 mg/200 mL premix        1,000 mg 200 mL/hr over 60 Minutes Intravenous  Once 10/17/19 1242 10/17/19 1441   10/17/19 1245  piperacillin-tazobactam (ZOSYN) IVPB 3.375 g        3.375 g 12.5 mL/hr over 240 Minutes Intravenous Every 8 hours 10/17/19 1242 10/24/19 2359   10/17/19 0900  ampicillin-sulbactam (UNASYN) 1.5 g in sodium chloride 0.9 % 100 mL IVPB  Status:  Discontinued        1.5 g 200 mL/hr over 30 Minutes Intravenous Every 6 hours 10/17/19 0809 10/17/19 0850   10/17/19 0900  Ampicillin-Sulbactam (UNASYN) 3 g in sodium chloride 0.9 % 100 mL IVPB  Status:  Discontinued        3 g 200 mL/hr over 30 Minutes Intravenous Every 6 hours 10/17/19 0850 10/17/19 1242   10/14/19 0600  vancomycin (VANCOREADY) IVPB 500 mg/100 mL  Status:  Discontinued        500 mg 100 mL/hr over 60 Minutes Intravenous Every 12 hours 10/14/19 0057 10/16/19 1534    10/12/19 1600  ampicillin (OMNIPEN) 2 g in sodium chloride 0.9 % 100 mL IVPB  Status:  Discontinued        2 g 300 mL/hr over 20 Minutes Intravenous Every 6 hours 10/12/19 1400 10/14/19 0934   10/12/19 0000  vancomycin (VANCOREADY) IVPB 750 mg/150 mL  Status:  Discontinued        750 mg 150 mL/hr over 60 Minutes Intravenous Every 12 hours 10/11/19 1254 10/14/19 0046   10/11/19 1230  cefTRIAXone (ROCEPHIN) 2 g in sodium chloride 0.9 % 100 mL IVPB  Status:  Discontinued        2 g 200 mL/hr over 30 Minutes Intravenous Every 12 hours 10/11/19 1133 10/16/19 1534   10/11/19 1230  ampicillin (OMNIPEN) 2 g in sodium chloride 0.9 % 100 mL IVPB  Status:  Discontinued        2 g 300 mL/hr over 20  Minutes Intravenous Every 4 hours 10/11/19 1133 10/12/19 1400   10/11/19 1200  vancomycin (VANCOREADY) IVPB 1500 mg/300 mL        1,500 mg 150 mL/hr over 120 Minutes Intravenous  Once 10/11/19 1135 10/11/19 1419   10/11/19 1145  vancomycin (VANCOCIN) IVPB 1000 mg/200 mL premix  Status:  Discontinued        1,000 mg 200 mL/hr over 60 Minutes Intravenous  Once 10/11/19 1133 10/11/19 1135       Subjective: Very drowsy this AM, difficult to ascertain  Objective: Vitals:   11/14/19 0353 11/14/19 0800 11/14/19 0838 11/14/19 1226  BP: (!) 143/65 (!) 124/56  115/63  Pulse: 78 89 94 76  Resp: 19 18 (!) 24 18  Temp: 98 F (36.7 C) 98.2 F (36.8 C)  98 F (36.7 C)  TempSrc: Oral Oral  Oral  SpO2: 100% 100% 100% 100%  Weight:      Height:        Intake/Output Summary (Last 24 hours) at 11/14/2019 1557 Last data filed at 11/14/2019 0845 Gross per 24 hour  Intake 154.68 ml  Output 800 ml  Net -645.32 ml   Filed Weights   11/10/19 0326 11/11/19 0500 11/12/19 0500  Weight: 72.7 kg 71.6 kg 70.8 kg    Examination: General exam: Asleep, arousable, laying in bed, in nad Respiratory system: Normal respiratory effort, no wheezing Cardiovascular system: regular rate, s1, s2 Gastrointestinal system:  Soft, nondistended, positive BS Central nervous system: CN2-12 grossly intact, strength intact Extremities: Perfused, no clubbing Skin: Normal skin turgor, no notable skin lesions seen Psychiatry: difficult to assess given mentation   Data Reviewed: I have personally reviewed following labs and imaging studies  CBC: Recent Labs  Lab 11/10/19 1039 11/11/19 0330 11/12/19 1016 11/13/19 0211 11/14/19 1008  WBC 12.5* 13.5* 14.3* 12.8* 10.2  NEUTROABS  --   --   --   --  7.7  HGB 10.0* 10.0* 10.2* 9.8* 8.8*  HCT 31.2* 31.2* 31.3* 29.8* 27.7*  MCV 90.7 91.0 91.8 92.8 94.2  PLT 398 417* 410* 314 132   Basic Metabolic Panel: Recent Labs  Lab 11/10/19 1039 11/11/19 0330 11/12/19 1016 11/13/19 0211 11/14/19 1008  NA 140 140 144 141 140  K 3.6 3.1* 3.0* 3.3* 3.5  CL 101 99 104 102 106  CO2 _0 GLUCOSE 178* 97 126* 174* 167*  BUN 32* 35* 31* 29* 27*  CREATININE 1.27* 1.43* 1.46* 1.47* 1.38*  CALCIUM 9.5 9.4 9.1 8.7* 8.6*  MG 2.0 2.2 2.1 1.9 1.9  PHOS  --   --   --  2.5 3.2   GFR: Estimated Creatinine Clearance: 57.7 mL/min (A) (by C-G formula based on SCr of 1.38 mg/dL (H)). Liver Function Tests: Recent Labs  Lab 11/12/19 1016 11/13/19 0211 11/14/19 1008  AST 34 51* 52*  ALT 32 40 48*  ALKPHOS 173* 165* 144*  BILITOT 0.5 0.5 0.4  PROT 7.5 7.1 6.9  ALBUMIN 3.0* 2.8* 2.8*   No results for input(s): LIPASE, AMYLASE in the last 168 hours. No results for input(s): AMMONIA in the last 168 hours. Coagulation Profile: No results for input(s): INR, PROTIME in the last 168 hours. Cardiac Enzymes: No results for input(s): CKTOTAL, CKMB, CKMBINDEX, TROPONINI in the last 168 hours. BNP (last 3 results) No results for input(s): PROBNP in the last 8760 hours. HbA1C: No results for input(s): HGBA1C in the last 72 hours. CBG: Recent Labs  Lab 11/13/19 2353 11/14/19 0351 11/14/19  5003 11/14/19 1119 11/14/19 1522  GLUCAP 160* 102* 114* 179* 130*   Lipid  Profile: No results for input(s): CHOL, HDL, LDLCALC, TRIG, CHOLHDL, LDLDIRECT in the last 72 hours. Thyroid Function Tests: No results for input(s): TSH, T4TOTAL, FREET4, T3FREE, THYROIDAB in the last 72 hours. Anemia Panel: No results for input(s): VITAMINB12, FOLATE, FERRITIN, TIBC, IRON, RETICCTPCT in the last 72 hours. Sepsis Labs: No results for input(s): PROCALCITON, LATICACIDVEN in the last 168 hours.  No results found for this or any previous visit (from the past 240 hour(s)).   Radiology Studies: DG CHEST PORT 1 VIEW  Result Date: 11/13/2019 CLINICAL DATA:  Abnormal CT of the abdomen EXAM: PORTABLE CHEST 1 VIEW COMPARISON:  Two days ago.  Abdominal CT from yesterday FINDINGS: Interstitial coarsening that is generalized. Mild streaky density at the right base, more prominent on CT comparison. No edema, effusion, or pneumothorax. Tracheostomy tube in place. Normal heart size. IMPRESSION: Atelectasis/infiltrate at the right base where there was extensive endobronchial debris by recent CT. Electronically Signed   By: Monte Fantasia M.D.   On: 11/13/2019 06:38    Scheduled Meds: . amLODipine  10 mg Per Tube Daily  . aspirin  81 mg Per Tube Daily  . atorvastatin  80 mg Per Tube Daily  . bethanechol  15 mg Per Tube TID  . chlorhexidine gluconate (MEDLINE KIT)  15 mL Mouth Rinse BID  . Chlorhexidine Gluconate Cloth  6 each Topical Daily  . clopidogrel  75 mg Per Tube Daily  . doxazosin  1 mg Per Tube Daily  . enoxaparin (LOVENOX) injection  40 mg Subcutaneous Q24H  . feeding supplement (PROSource TF)  45 mL Per Tube BID  . Gerhardt's butt cream   Topical Daily  . hydrALAZINE  50 mg Per Tube Q8H  . insulin aspart  0-15 Units Subcutaneous Q4H  . insulin aspart  2 Units Subcutaneous Q4H  . insulin glargine  12 Units Subcutaneous QHS  . lacosamide  150 mg Per Tube BID  . levETIRAcetam  1,000 mg Per Tube BID  . mouth rinse  15 mL Mouth Rinse q12n4p  . neomycin-bacitracin-polymyxin   1 application Topical Daily  . phenytoin  175 mg Per Tube TID  . QUEtiapine  50 mg Per Tube QHS  . scopolamine  1 patch Transdermal Q72H  . sennosides  5 mL Per Tube BID   Continuous Infusions: . sodium chloride Stopped (11/14/19 0053)  . famotidine (PEPCID) IV 20 mg (11/14/19 0922)  . feeding supplement (OSMOLITE 1.5 CAL) 55 mL/hr at 11/14/19 0028     LOS: 63 days   Marylu Lund, MD [facility] Pager On Amion  If 7PM-7AM, please contact night-coverage 11/14/2019, 3:57 PM

## 2019-11-15 DIAGNOSIS — R935 Abnormal findings on diagnostic imaging of other abdominal regions, including retroperitoneum: Secondary | ICD-10-CM

## 2019-11-15 LAB — COMPREHENSIVE METABOLIC PANEL
ALT: 37 U/L (ref 0–44)
AST: 43 U/L — ABNORMAL HIGH (ref 15–41)
Albumin: 2.5 g/dL — ABNORMAL LOW (ref 3.5–5.0)
Alkaline Phosphatase: 129 U/L — ABNORMAL HIGH (ref 38–126)
Anion gap: 11 (ref 5–15)
BUN: 27 mg/dL — ABNORMAL HIGH (ref 6–20)
CO2: 22 mmol/L (ref 22–32)
Calcium: 8.2 mg/dL — ABNORMAL LOW (ref 8.9–10.3)
Chloride: 103 mmol/L (ref 98–111)
Creatinine, Ser: 1.36 mg/dL — ABNORMAL HIGH (ref 0.61–1.24)
GFR calc Af Amer: >60 mL/min (ref >60)
GFR calc non Af Amer: 57 mL/min — ABNORMAL LOW (ref >60)
Glucose, Bld: 89 mg/dL (ref 70–99)
Potassium: 3.8 mmol/L (ref 3.5–5.1)
Sodium: 136 mmol/L (ref 135–145)
Total Bilirubin: 0.2 mg/dL — ABNORMAL LOW (ref 0.3–1.2)
Total Protein: 6.2 g/dL — ABNORMAL LOW (ref 6.5–8.1)

## 2019-11-15 LAB — GLUCOSE, CAPILLARY
Glucose-Capillary: 113 mg/dL — ABNORMAL HIGH (ref 70–99)
Glucose-Capillary: 114 mg/dL — ABNORMAL HIGH (ref 70–99)
Glucose-Capillary: 128 mg/dL — ABNORMAL HIGH (ref 70–99)
Glucose-Capillary: 150 mg/dL — ABNORMAL HIGH (ref 70–99)
Glucose-Capillary: 161 mg/dL — ABNORMAL HIGH (ref 70–99)
Glucose-Capillary: 179 mg/dL — ABNORMAL HIGH (ref 70–99)
Glucose-Capillary: 98 mg/dL (ref 70–99)

## 2019-11-15 LAB — CBC
HCT: 24.9 % — ABNORMAL LOW (ref 39.0–52.0)
Hemoglobin: 7.8 g/dL — ABNORMAL LOW (ref 13.0–17.0)
MCH: 29.1 pg (ref 26.0–34.0)
MCHC: 31.3 g/dL (ref 30.0–36.0)
MCV: 92.9 fL (ref 80.0–100.0)
Platelets: 274 10*3/uL (ref 150–400)
RBC: 2.68 MIL/uL — ABNORMAL LOW (ref 4.22–5.81)
RDW: 14.2 % (ref 11.5–15.5)
WBC: 7 10*3/uL (ref 4.0–10.5)
nRBC: 0 % (ref 0.0–0.2)

## 2019-11-15 NOTE — Progress Notes (Signed)
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 Patient is living in a assisted living facility and can ambulate by himself. Due to concerns of seizure he was intubated for airway protection and taken to the ICU. Eventually had tracheostomy placed on 8/23. Since then he has had multiple swallowing evaluation which he continues to fail and progressing only very slowly. He's now s/p PEG on 9/7.  Plan for CIR at this time.  Assessment & Plan:   Principal Problem:   Status epilepticus ([place]) Active Problems:   Carotid artery stenosis   Seizure (HCC)   CVA (cerebrovascular accident due to intracerebral hemorrhage) (HCC)/ left frontal lobe   AKI (acute kidney injury) ([place])   Essential hypertension   Type 2 diabetes mellitus with hyperlipidemia (HCC)   Acute on chronic chronic respiratory failure with hypoxemia requiring tracheostomy tube placement and mechanical ventilatory support. -Currently has tracheostomy in place. Continue routine supportive care with frequent suctioning. Advised to use incentive spirometer, Mucinex twice daily, bronchodilators scheduled twice daily and every 6 hours as needed. -Continue with routine oral/tracheostomy care -Cont out of bed to chair.  Nausea  Vomiting: Resolved Recent CT abdomen pelvis without acute intraabdominal abnormalities zofran prn  S/p PEG Placement:  Continue tube feed as tolerated -Pt with residual pain around PEG site. Continue analgesic as already ordered  Status epilepticus in the setting of recent large left MCA stroke -Continue Vimpat, Keppra and Dilantin. - pharmacy adjusting dilantin levels  Fever, improvedlikely in the setting of pneumonia with  concern for aspiration, resolved -Concerns of possible aspiration by previous provider. Procalcitonin negative - recently afebrile - WBC had been trending down -> he's afebrile - CXR 9/10 with atelectasis/infitrate at R base (where endobronchial debris by recent CT) - CT abd/pelvis from 9/9 with atelectasis and/or consolidation of R lower lobe with bronchitic changes  Essential hypertension, uncontrolled -Currently on Norvasc 10 mg daily -continued on hydralazine to 50 units 3 times daily -bp stable at this time  Urinary Retention will start doxazosin, continue bethanecol  Failed TOV 9/9 - 9/10 -> continue foley   AKI, improved Creatinine recently starting to rise mildly, continue to monitor Renal function now improved Baseline appears to be around 1.2 Recheck bmet in AM  Diabetes type 2 with Hyperglycemia -Continue Accu-Cheks and sliding scale -NovoLog 2 units every 4 hours,continue bedtime Lantus -Glycemic trends currently stable  Dysphagia. S/p peg placement  Normocytic Anemia -Hemoglobin is currently stable  Hx of Left MCA stroke with residual aphasia and right hemiparesis  -Continue with Aspirin Plavix, Lipitor 80 mg daily.   Right arm swelling  -Likely from superficial thrombosis, recommend warm compresses  Abnormal LFT's -In the setting of acute illness, continue to monitor -stable at this time, cont to follow  DVT prophylaxis: Lovenox subq Code Status: Full Family Communication: Pt in room, family not at bedside  Status is: Inpatient  Remains inpatient appropriate because:Unsafe d/c plan   Dispo: The patient is from: Home              Anticipated d/c is to: SNF              Anticipated d/c date is: 3 days              Patient currently  is not medically stable to d/c.  Consultants:   PCCM  Neurology  Procedures:  PEG 9/7 ETT Trach 8/19  Antimicrobials: Anti-infectives (From admission, onward)   Start     Dose/Rate Route  Frequency Ordered Stop   11/10/19 1815  ceFAZolin (ANCEF) IVPB 2g/100 mL premix        2 g 200 mL/hr over 30 Minutes Intravenous On call 11/10/19 1808 11/10/19 2104   11/10/19 1551  ceFAZolin (ANCEF) 2-4 GM/100ML-% IVPB       Note to Pharmacy: Yolanda Manges   : cabinet override      11/10/19 1551 11/10/19 1555   11/10/19 0000  ceFAZolin (ANCEF) IVPB 2g/100 mL premix        2 g 200 mL/hr over 30 Minutes Intravenous Once 11/09/19 1220 11/10/19 0027   10/30/19 1400  cefTRIAXone (ROCEPHIN) 2 g in sodium chloride 0.9 % 100 mL IVPB  Status:  Discontinued        2 g 200 mL/hr over 30 Minutes Intravenous Every 24 hours 10/30/19 1251 11/04/19 1358   10/18/19 2300  vancomycin (VANCOREADY) IVPB 750 mg/150 mL  Status:  Discontinued        750 mg 150 mL/hr over 60 Minutes Intravenous Every 24 hours 10/18/19 1359 10/19/19 1217   10/18/19 0200  vancomycin (VANCOREADY) IVPB 500 mg/100 mL  Status:  Discontinued        500 mg 100 mL/hr over 60 Minutes Intravenous Every 12 hours 10/17/19 1242 10/18/19 1359   10/17/19 1245  vancomycin (VANCOCIN) IVPB 1000 mg/200 mL premix        1,000 mg 200 mL/hr over 60 Minutes Intravenous  Once 10/17/19 1242 10/17/19 1441   10/17/19 1245  piperacillin-tazobactam (ZOSYN) IVPB 3.375 g        3.375 g 12.5 mL/hr over 240 Minutes Intravenous Every 8 hours 10/17/19 1242 10/24/19 2359   10/17/19 0900  ampicillin-sulbactam (UNASYN) 1.5 g in sodium chloride 0.9 % 100 mL IVPB  Status:  Discontinued        1.5 g 200 mL/hr over 30 Minutes Intravenous Every 6 hours 10/17/19 0809 10/17/19 0850   10/17/19 0900  Ampicillin-Sulbactam (UNASYN) 3 g in sodium chloride 0.9 % 100 mL IVPB  Status:  Discontinued        3 g 200 mL/hr over 30 Minutes Intravenous Every 6 hours 10/17/19 0850 10/17/19 1242   10/14/19 0600  vancomycin (VANCOREADY) IVPB 500 mg/100 mL  Status:  Discontinued        500 mg 100 mL/hr over 60 Minutes Intravenous Every 12 hours 10/14/19 0057 10/16/19 1534   10/12/19  1600  ampicillin (OMNIPEN) 2 g in sodium chloride 0.9 % 100 mL IVPB  Status:  Discontinued        2 g 300 mL/hr over 20 Minutes Intravenous Every 6 hours 10/12/19 1400 10/14/19 0934   10/12/19 0000  vancomycin (VANCOREADY) IVPB 750 mg/150 mL  Status:  Discontinued        750 mg 150 mL/hr over 60 Minutes Intravenous Every 12 hours 10/11/19 1254 10/14/19 0046   10/11/19 1230  cefTRIAXone (ROCEPHIN) 2 g in sodium chloride 0.9 % 100 mL IVPB  Status:  Discontinued        2 g 200 mL/hr over 30 Minutes Intravenous Every 12 hours 10/11/19 1133 10/16/19 1534   10/11/19 1230  ampicillin (OMNIPEN) 2 g in sodium chloride 0.9 % 100 mL IVPB  Status:  Discontinued        2 g 300 mL/hr over  20 Minutes Intravenous Every 4 hours 10/11/19 1133 10/12/19 1400   10/11/19 1200  vancomycin (VANCOREADY) IVPB 1500 mg/300 mL        1,500 mg 150 mL/hr over 120 Minutes Intravenous  Once 10/11/19 1135 10/11/19 1419   10/11/19 1145  vancomycin (VANCOCIN) IVPB 1000 mg/200 mL premix  Status:  Discontinued        1,000 mg 200 mL/hr over 60 Minutes Intravenous  Once 10/11/19 1133 10/11/19 1135      Subjective: Still having some abd pain localized around PEG site  Objective: Vitals:   11/15/19 0806 11/15/19 0820 11/15/19 1201 11/15/19 1603  BP:  (!) 141/60 120/61 131/68  Pulse:  78 76 77  Resp: '16 20 20 18  ' Temp:  98.3 F (36.8 C) 98.2 F (36.8 C) 98.2 F (36.8 C)  TempSrc:  Oral Oral Oral  SpO2:  99% 99% 100%  Weight:      Height:        Intake/Output Summary (Last 24 hours) at 11/15/2019 1627 Last data filed at 11/15/2019 0445 Gross per 24 hour  Intake 3454.95 ml  Output 450 ml  Net 3004.95 ml   Filed Weights   11/10/19 0326 11/11/19 0500 11/12/19 0500  Weight: 72.7 kg 71.6 kg 70.8 kg    Examination: General exam: Awake, laying in bed, in nad Respiratory system: Normal respiratory effort, no wheezing Cardiovascular system: regular rate, s1, s2 Gastrointestinal system: Soft, nondistended, positive  BS, PEG in place Central nervous system: CN2-12 grossly intact, strength intact Extremities: Perfused, no clubbing Skin: Normal skin turgor, no notable skin lesions seen Psychiatry: Mood normal // no visual hallucinations   Data Reviewed: I have personally reviewed following labs and imaging studies  CBC: Recent Labs  Lab 11/11/19 0330 11/12/19 1016 11/13/19 0211 11/14/19 1008 11/15/19 0150  WBC 13.5* 14.3* 12.8* 10.2 7.0  NEUTROABS  --   --   --  7.7  --   HGB 10.0* 10.2* 9.8* 8.8* 7.8*  HCT 31.2* 31.3* 29.8* 27.7* 24.9*  MCV 91.0 91.8 92.8 94.2 92.9  PLT 417* 410* 314 295 631   Basic Metabolic Panel: Recent Labs  Lab 11/10/19 1039 11/10/19 1039 11/11/19 0330 11/12/19 1016 11/13/19 0211 11/14/19 1008 11/15/19 0150  NA 140   < > 140 144 141 140 136  K 3.6   < > 3.1* 3.0* 3.3* 3.5 3.8  CL 101   < > 99 104 102 106 103  CO2 27   < > '29 25 26 24 22  ' GLUCOSE 178*   < > 97 126* 174* 167* 89  BUN 32*   < > 35* 31* 29* 27* 27*  CREATININE 1.27*   < > 1.43* 1.46* 1.47* 1.38* 1.36*  CALCIUM 9.5   < > 9.4 9.1 8.7* 8.6* 8.2*  MG 2.0  --  2.2 2.1 1.9 1.9  --   PHOS  --   --   --   --  2.5 3.2  --    < > = values in this interval not displayed.   GFR: Estimated Creatinine Clearance: 58.6 mL/min (A) (by C-G formula based on SCr of 1.36 mg/dL (H)). Liver Function Tests: Recent Labs  Lab 11/12/19 1016 11/13/19 0211 11/14/19 1008 11/15/19 0150  AST 34 51* 52* 43*  ALT 32 40 48* 37  ALKPHOS 173* 165* 144* 129*  BILITOT 0.5 0.5 0.4 0.2*  PROT 7.5 7.1 6.9 6.2*  ALBUMIN 3.0* 2.8* 2.8* 2.5*   No results for input(s): LIPASE, AMYLASE  in the last 168 hours. No results for input(s): AMMONIA in the last 168 hours. Coagulation Profile: No results for input(s): INR, PROTIME in the last 168 hours. Cardiac Enzymes: No results for input(s): CKTOTAL, CKMB, CKMBINDEX, TROPONINI in the last 168 hours. BNP (last 3 results) No results for input(s): PROBNP in the last 8760  hours. HbA1C: No results for input(s): HGBA1C in the last 72 hours. CBG: Recent Labs  Lab 11/14/19 2018 11/15/19 0010 11/15/19 0332 11/15/19 0717 11/15/19 1115  GLUCAP 116* 114* 98 161* 128*   Lipid Profile: No results for input(s): CHOL, HDL, LDLCALC, TRIG, CHOLHDL, LDLDIRECT in the last 72 hours. Thyroid Function Tests: No results for input(s): TSH, T4TOTAL, FREET4, T3FREE, THYROIDAB in the last 72 hours. Anemia Panel: No results for input(s): VITAMINB12, FOLATE, FERRITIN, TIBC, IRON, RETICCTPCT in the last 72 hours. Sepsis Labs: No results for input(s): PROCALCITON, LATICACIDVEN in the last 168 hours.  No results found for this or any previous visit (from the past 240 hour(s)).   Radiology Studies: No results found.  Scheduled Meds: . amLODipine  10 mg Per Tube Daily  . aspirin  81 mg Per Tube Daily  . atorvastatin  80 mg Per Tube Daily  . bethanechol  15 mg Per Tube TID  . chlorhexidine gluconate (MEDLINE KIT)  15 mL Mouth Rinse BID  . Chlorhexidine Gluconate Cloth  6 each Topical Daily  . clopidogrel  75 mg Per Tube Daily  . doxazosin  1 mg Per Tube Daily  . enoxaparin (LOVENOX) injection  40 mg Subcutaneous Q24H  . feeding supplement (PROSource TF)  45 mL Per Tube BID  . Gerhardt's butt cream   Topical Daily  . hydrALAZINE  50 mg Per Tube Q8H  . insulin aspart  0-15 Units Subcutaneous Q4H  . insulin aspart  2 Units Subcutaneous Q4H  . insulin glargine  12 Units Subcutaneous QHS  . lacosamide  150 mg Per Tube BID  . levETIRAcetam  1,000 mg Per Tube BID  . mouth rinse  15 mL Mouth Rinse q12n4p  . neomycin-bacitracin-polymyxin  1 application Topical Daily  . phenytoin  175 mg Per Tube TID  . QUEtiapine  50 mg Per Tube QHS  . scopolamine  1 patch Transdermal Q72H  . sennosides  5 mL Per Tube BID   Continuous Infusions: . sodium chloride Stopped (11/14/19 0053)  . famotidine (PEPCID) IV 20 mg (11/15/19 1216)  . feeding supplement (OSMOLITE 1.5 CAL) 1,000 mL  (11/15/19 1614)     LOS: 35 days   Marylu Lund, MD [facility] Pager On Amion  If 7PM-7AM, please contact night-coverage 11/15/2019, 4:27 PM

## 2019-11-15 NOTE — Progress Notes (Signed)
Physical Therapy Treatment Patient Details Name: Corey Bennett MRN: [id number] DOB: January 31, 1961 Today's Date: 11/15/2019    History of Present Illness 59 y.o. 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  He was intubated for airway protection on 10/10/19. Pt extubated on 10/14/19.  on 8/14, pt had continued twitching of Rt UE with AMS, and decreased 02 sats.  He was intubated.  Diagnosis: status epilepticus in setting of prior large Lt MCA infarct; acute kidney injury, possible acute meningitis.  He underwent tracheostomy on 8/19/2.  PMH includes: poorly controlled DM, dysphagia, HTN.  PEG  11/10/19.    PT Comments    Pt tolerates treatment well, ambulating for increase distances compared to previous session. Pt demonstrates increased postural sway during standing activities and requires PT physical assistance to prevent multiple losses of balance during session. Pt with increased R knee flexion throughout gait cycle and with RLE weakness may be at higher risk of buckling, however none noted this session. PT continues to recommend SNF placement at this time.   Follow Up Recommendations  SNF     Equipment Recommendations  Wheelchair (measurements PT);Wheelchair cushion (measurements PT)    Recommendations for Other Services       Precautions / Restrictions Precautions Precautions: Fall Precaution Comments: trach, PEG Restrictions Weight Bearing Restrictions: No    Mobility  Bed Mobility Overal bed mobility: Needs Assistance Bed Mobility: Supine to Sit;Rolling Rolling: Min assist   Supine to sit: Min assist        Transfers Overall transfer level: Needs assistance Equipment used: None Transfers: Sit to/from Stand Sit to Stand: Min guard            Ambulation/Gait Ambulation/Gait assistance: Editor, commissioning (Feet): 20 Feet (initial trial of 10') Assistive  device: IV Pole;None Gait Pattern/deviations: Step-to pattern;Decreased dorsiflexion - right;Decreased step length - right;Drifts right/left Gait velocity: reduced Gait velocity interpretation: <1.31 ft/sec, indicative of household ambulator General Gait Details: pt with increased postural sway, increased R knee flexion throughout the gait cycle, pt drifts left and right often with multiple losses of balance PT corrects   Stairs             Wheelchair Mobility    Modified Rankin (Stroke Patients Only)       Balance Overall balance assessment: Needs assistance Sitting-balance support: Single extremity supported;Feet supported Sitting balance-Leahy Scale: Poor Sitting balance - Comments: reliant on LUE support of bed   Standing balance support: No upper extremity supported Standing balance-Leahy Scale: Poor Standing balance comment: minA for static standing balance without UE support                            Cognition Arousal/Alertness: Awake/alert Behavior During Therapy: WFL for tasks assessed/performed Overall Cognitive Status: No family/caregiver present to determine baseline cognitive functioning                                 General Comments: pt follows one step commands consistently, increased processing time, reduced safety awareness      Exercises      General Comments General comments (skin integrity, edema, etc.): VSS on RA with activity, pt returned to 5L 28% FiO2 trach collar at end of session      Pertinent Vitals/Pain Pain Assessment: Faces Faces Pain  Scale: No hurt    Home Living                      Prior Function            PT Goals (current goals can now be found in the care plan section) Acute Rehab PT Goals Patient Stated Goal: pt unable to state  Progress towards PT goals: Progressing toward goals    Frequency    Min 3X/week      PT Plan Current plan remains appropriate     Co-evaluation              AM-PAC PT "6 Clicks" Mobility   Outcome Measure  Help needed turning from your back to your side while in a flat bed without using bedrails?: A Little Help needed moving from lying on your back to sitting on the side of a flat bed without using bedrails?: A Little Help needed moving to and from a bed to a chair (including a wheelchair)?: A Little Help needed standing up from a chair using your arms (e.g., wheelchair or bedside chair)?: A Little Help needed to walk in hospital room?: A Little Help needed climbing 3-5 steps with a railing? : Total 6 Click Score: 16    End of Session   Activity Tolerance: Patient tolerated treatment well Patient left: in chair;with call bell/phone within reach;with chair alarm set Nurse Communication: Mobility status PT Visit Diagnosis: Other abnormalities of gait and mobility (R26.89);Other symptoms and signs involving the nervous system (R29.898);Muscle weakness (generalized) (M62.81);Unsteadiness on feet (R26.81);Difficulty in walking, not elsewhere classified (R26.2) Hemiplegia - Right/Left: Right Hemiplegia - caused by: Cerebral infarction     Time: 7494-4967 PT Time Calculation (min) (ACUTE ONLY): 25 min  Charges:  $Gait Training: 8-22 mins $Therapeutic Activity: 8-22 mins                     Arlyss Gandy, PT, DPT Acute Rehabilitation Pager: [phone]    Arlyss Gandy 11/15/2019, 4:42 PM

## 2019-11-16 LAB — CBC
HCT: 29.1 % — ABNORMAL LOW (ref 39.0–52.0)
Hemoglobin: 9.4 g/dL — ABNORMAL LOW (ref 13.0–17.0)
MCH: 30.3 pg (ref 26.0–34.0)
MCHC: 32.3 g/dL (ref 30.0–36.0)
MCV: 93.9 fL (ref 80.0–100.0)
Platelets: 331 10*3/uL (ref 150–400)
RBC: 3.1 MIL/uL — ABNORMAL LOW (ref 4.22–5.81)
RDW: 13.9 % (ref 11.5–15.5)
WBC: 10.5 10*3/uL (ref 4.0–10.5)
nRBC: 0 % (ref 0.0–0.2)

## 2019-11-16 LAB — GLUCOSE, CAPILLARY
Glucose-Capillary: 121 mg/dL — ABNORMAL HIGH (ref 70–99)
Glucose-Capillary: 139 mg/dL — ABNORMAL HIGH (ref 70–99)
Glucose-Capillary: 148 mg/dL — ABNORMAL HIGH (ref 70–99)
Glucose-Capillary: 155 mg/dL — ABNORMAL HIGH (ref 70–99)
Glucose-Capillary: 155 mg/dL — ABNORMAL HIGH (ref 70–99)
Glucose-Capillary: 58 mg/dL — ABNORMAL LOW (ref 70–99)
Glucose-Capillary: 95 mg/dL (ref 70–99)

## 2019-11-16 LAB — COMPREHENSIVE METABOLIC PANEL
ALT: 36 U/L (ref 0–44)
AST: 35 U/L (ref 15–41)
Albumin: 2.8 g/dL — ABNORMAL LOW (ref 3.5–5.0)
Alkaline Phosphatase: 142 U/L — ABNORMAL HIGH (ref 38–126)
Anion gap: 11 (ref 5–15)
BUN: 23 mg/dL — ABNORMAL HIGH (ref 6–20)
CO2: 22 mmol/L (ref 22–32)
Calcium: 8.4 mg/dL — ABNORMAL LOW (ref 8.9–10.3)
Chloride: 105 mmol/L (ref 98–111)
Creatinine, Ser: 1.26 mg/dL — ABNORMAL HIGH (ref 0.61–1.24)
GFR calc Af Amer: >60 mL/min (ref >60)
GFR calc non Af Amer: >60 mL/min (ref >60)
Glucose, Bld: 58 mg/dL — ABNORMAL LOW (ref 70–99)
Potassium: 3.9 mmol/L (ref 3.5–5.1)
Sodium: 138 mmol/L (ref 135–145)
Total Bilirubin: 0.3 mg/dL (ref 0.3–1.2)
Total Protein: 7 g/dL (ref 6.5–8.1)

## 2019-11-16 MED ORDER — FAMOTIDINE 10 MG PO TABS
10.0000 mg | ORAL_TABLET | Freq: Every day | ORAL | Status: DC
  Administered 2019-11-16 – 2019-11-21 (×6): 10 mg
  Filled 2019-11-16 (×6): qty 1

## 2019-11-16 NOTE — Progress Notes (Signed)
Occupational Therapy Treatment Patient Details Name: Corey Bennett MRN: [id number] DOB: 10/25/1960 Today's Date: 11/16/2019    History of present illness 59 y.o. 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  He was intubated for airway protection on 10/10/19. Pt extubated on 10/14/19.  on 8/14, pt had continued twitching of Rt UE with AMS, and decreased 02 sats.  He was intubated.  Diagnosis: status epilepticus in setting of prior large Lt MCA infarct; acute kidney injury, possible acute meningitis.  He underwent tracheostomy on 8/19/2.  PMH includes: poorly controlled DM, dysphagia, HTN.  PEG  11/10/19.   OT comments  This 59 yo male admitted with above presents to acute OT today with bed mobility min guard A, sit<>stand with min guard A, min A to side step up towards HOB with RW, standing to work on two grooming tasks (with encouragement for thoroughness). Order placed for RUE resting hand splint this session due to noted some tightness starting in pt's digits. He will continue to benefit from acute OT and I feel CIR is still appropriate for pt--he does need a lot of encouragement and has a lot of potential to make good gains on CIR.  Follow Up Recommendations  Supervision/Assistance - 24 hour;CIR    Equipment Recommendations  Other (comment) (TBD next venue)    Recommendations for Other Services Rehab consult    Precautions / Restrictions Precautions Precautions: Fall Precaution Comments: trach, PEG Restrictions Weight Bearing Restrictions: No       Mobility Bed Mobility Overal bed mobility: Needs Assistance Bed Mobility: Supine to Sit;Sit to Supine     Supine to sit: Min guard Sit to supine: Min guard      Transfers Overall transfer level: Needs assistance Equipment used: Rolling walker (2 wheeled) Transfers: Sit to/from Stand Sit to Stand: Min guard         General  transfer comment: min A to take 2 steps up towards [facility] with A to hold RUE on RW    Balance Overall balance assessment: Needs assistance Sitting-balance support: No upper extremity supported;Feet supported Sitting balance-Leahy Scale: Good     Standing balance support: No upper extremity supported Standing balance-Leahy Scale: Fair Standing balance comment: standing to wash face and comb hair                           ADL either performed or assessed with clinical judgement   ADL Overall ADL's : Needs assistance/impaired     Grooming: Wash/dry face;Standing;Min guard;Brushing hair                                       Vision Baseline Vision/History: Wears glasses Patient Visual Report: No change from baseline            Cognition Arousal/Alertness: Awake/alert Behavior During Therapy: Flat affect Overall Cognitive Status: No family/caregiver present to determine baseline cognitive functioning Area of Impairment: Following commands;Safety/judgement;Awareness;Problem solving                       Following Commands: Follows one step commands consistently Safety/Judgement: Decreased awareness of safety;Decreased awareness of deficits Awareness: Intellectual Problem Solving: Decreased initiation          Exercises Other Exercises Other Exercises: Asked pt to move  RUE once I pulled covers back and pt able to show trace movement at shoulder for flexion. Noted pt's digits starting to get stiff/tight (chat text with Dr. Rhona Leavens and ordered pt a resting hand splint)           Pertinent Vitals/ Pain       Pain Assessment: No/denies pain Faces Pain Scale: No hurt         Frequency  Min 2X/week        Progress Toward Goals  OT Goals(current goals can now be found in the care plan section)  Progress towards OT goals: Progressing toward goals     Plan Discharge plan remains appropriate       AM-PAC OT "6 Clicks" Daily Activity      Outcome Measure   Help from another person eating meals?: Total (NPO) Help from another person taking care of personal grooming?: A Little Help from another person toileting, which includes using toliet, bedpan, or urinal?: A Lot Help from another person bathing (including washing, rinsing, drying)?: A Lot Help from another person to put on and taking off regular upper body clothing?: A Lot Help from another person to put on and taking off regular lower body clothing?: A Lot 6 Click Score: 12    End of Session Equipment Utilized During Treatment: Gait belt;Rolling walker  OT Visit Diagnosis: Hemiplegia and hemiparesis;Cognitive communication deficit (R41.841);Other symptoms and signs involving cognitive function;Other abnormalities of gait and mobility (R26.89);Muscle weakness (generalized) (M62.81) Symptoms and signs involving cognitive functions: Cerebral infarction Hemiplegia - Right/Left: Right Hemiplegia - dominant/non-dominant: Dominant Hemiplegia - caused by: Cerebral infarction   Activity Tolerance Patient tolerated treatment well   Patient Left in bed;with call bell/phone within reach;with bed alarm set   Nurse Communication          Time: 2094-7096 OT Time Calculation (min): 25 min  Charges: OT General Charges $OT Visit: 1 Visit OT Treatments $Self Care/Home Management : 23-37 mins  Ignacia Palma, OTR/L Acute Rehab Services Pager [phone] Office [phone]      Evette Georges 11/16/2019, 8:46 PM

## 2019-11-16 NOTE — Progress Notes (Signed)
Hypoglycemic Event  CBG: 58  Treatment: 4 oz juice/soda  Symptoms: None  Follow-up CBG:  Time:0359 CBG Result:95  Possible Reasons for Event: Inadequate meal intake  Comments/MD notified: Patient may have had a loss of tube feeding for small period of time causing this drop.  Hypoglycemic protocol followed MD not notified at this time.    Renard Hamper, RN  11/16/2019

## 2019-11-16 NOTE — Progress Notes (Signed)
Orthopedic Tech Progress Note Patient Details:  Corey Bennett January 20, 1961 [id number] Called in order HANGER for a RESTING HAND SPLINT  Patient ID: Corey Bennett, male   DOB: 04/13/1960, 59 y.o.   MRN: [id number] Corey Bennett 11/16/2019, 1:57 PM

## 2019-11-16 NOTE — Progress Notes (Addendum)
  Speech Language Pathology Treatment: Dysphagia;[name] Speaking valve;Cognitive-Linquistic  Patient Details Name: Corey Bennett MRN: [id number] DOB: 11-Nov-1960 Today's Date: 11/16/2019 Time: 5400-8676 SLP Time Calculation (min) (ACUTE ONLY): 17 min  Assessment / Plan / Recommendation Clinical Impression  Session targeted function communication swallowing and use of PMSV, though participation limited due to pt behavior and initial resistance. Pt initially refused all interaction with SLP though it was suspected that pt had minimal comprehension of any verbal explanation or rationale. With patience pt eventually allowed PMSV placement with improved voicing of single words. Expressive language unintelligible with heavy errors in spontaneous speech, though in repetition tasks pt repeated 3/3 words with approximation of target sounds with increased intelligibility. Pt does well with choice cues for expressive language tasks. In swallowing, pt accepted about 3 oz of nectar thick water via cup/tsp with weak multiple swallows. Pt refused to attempt a preventative throat clear/cough but eventually experienced a delayed cough after 3 oz consumed. Introduced Special educational needs teacher. Pt used trainer x2 with a model to coordinate blowing.   Pt does show promising improvements, particularly in secretion management. Increased frequency of swallowing opportunities is essential to improvement. Would consider starting nectar thick liquids via teaspoon in moderation with staff over the rest of the week. Will consider educating staff about offering PO trials at next session.    HPI HPI: Pt is a 59 yo male admitted from ALF with status epilepticus in the setting of recent large L MCA stroke, requiring intubation 8/8-8/11. BSE 8/12 recommended NPO with concern for reduced secretion management and pt was subsequently reintubated 8/14. Corey Bennett 8/19. Pt was evaluated by OP SLP 7/26 (after completing CIR in [place] and [facility] SLP),  noting "receptive and expressive aphasia (likely severe) and some degree of verbal apraxia." He was awaiting a speech generating device. PMH: CVA with aphasia and hemiparesis, HTN, hypercholesterolemia, diabetes.  Plans for PEG placement 9/7      SLP Plan  Continue with current plan of care       Recommendations  Diet recommendations: Nectar-thick liquid Liquids provided via: Teaspoon Medication Administration: Via alternative means Supervision: Staff to assist with self feeding;Full supervision/cueing for compensatory strategies      Patient may use Passy-Muir Speech Valve: During all therapies with supervision;Intermittently with supervision PMSV Supervision: Full         Oral Care Recommendations: Oral care BID Follow up Recommendations: Skilled Nursing facility SLP Visit Diagnosis: Dysphagia, oropharyngeal phase (R13.12) Plan: Continue with current plan of care       GO               Harlon Ditty, MA CCC-SLP  Acute Rehabilitation Services Pager [phone] Office [phone]  Claudine Mouton 11/16/2019, 2:18 PM

## 2019-11-16 NOTE — Progress Notes (Signed)
Orthopedic Tech Progress Note Patient Details:  Corey Bennett 1961-01-20 [id number] Ordered resting hand splint Patient ID: Tye Savoy, male   DOB: Jul 20, 1960, 59 y.o.   MRN: [id number]   Gerald Stabs 11/16/2019, 1:58 PM

## 2019-11-16 NOTE — Progress Notes (Signed)
MEDICATION RELATED CONSULT NOTE - FOLLOW UP   Pharmacy Consult for Phenytoin Indication: Seizures   No Known Allergies  Patient Measurements: Height: 6\' 1"  (185.4 cm) Weight: 70.8 kg (156 lb 1.4 oz) IBW/kg (Calculated) : 79.9  Vital Signs: Temp: 98.2 F (36.8 C) (09/13 0732) Temp Source: Oral (09/13 0732) BP: 156/75 (09/13 0732) Pulse Rate: 80 (09/13 0732) Intake/Output from previous day: 09/12 0701 - 09/13 0700 In: 100 [IV Piggyback:100] Out: 1925 [Urine:1925] Intake/Output from this shift: No intake/output data recorded.  Labs: Recent Labs    11/14/19 1008 11/15/19 0150 11/16/19 0250  WBC 10.2 7.0 10.5  HGB 8.8* 7.8* 9.4*  HCT 27.7* 24.9* 29.1*  PLT 295 274 331  CREATININE 1.38* 1.36* 1.26*  MG 1.9  --   --   PHOS 3.2  --   --   ALBUMIN 2.8* 2.5* 2.8*  PROT 6.9 6.2* 7.0  AST 52* 43* 35  ALT 48* 37 36  ALKPHOS 144* 129* 142*  BILITOT 0.4 0.2* 0.3   Estimated Creatinine Clearance: 63.2 mL/min (A) (by C-G formula based on SCr of 1.26 mg/dL (H)).    Assessment: 59 yo M admitted with seizures. Last seizure noted 8/14  - 8/7 loaded with DPH (goal 15-20) and Keppra - 8/9 Keppra dose reduced d/t renal fxn 750mg  IV q12h > 8/13 incr back to 1000mg  q12h  8/7 DPH 1500mg  IV x1 then 100mg  Q8H (~4 mg/kg/d based on TBW; IBW 80 kg) 8/8 DPH level (post-load): 20.1 >> no adjustments, cont current dosing 8/11 DPH 14.1, albumin 4.1 8/12 DPH 13.4.  Vimpat and Onfi added. 8/13 DPH 12.6, Albumin 2.5, corrected 21. Vimpat and Keppra increased d/t improved CrCl 8/15 DPH 7.5 > corrected = 12.5, free level ~0.95 >> 150mg  x1 then resume 100 Q8hr, renal fx worsening 8/17 DPH 4.3 (Corrects to 9.35 based on albumin from 8/18) - continue current dose per MD  8/18: Phenytoin level 3 adjusts to 8.4 8/24: Switched from IV to PO  8/30: Phenytoin level 3.5 adjusted to 6 for albumin and renal function (goal 15-20)  9/4: Phenytoin level 3.8 adjusted to 5.9 for albumin and renal  function  Phenytoin level dropped significantly after switching to per tube administration due to binding by continuous tube feeds. Would be difficult to hold tube feeds 2hr before and after each dose. Could switch to nocturnal feeds but would have to increase TF rate significantly to 18ml/hr to get the same nutrition.   9/4 update: Discussed with MD to increase dose vs switch back to IV.  Per MD, increase dose and recheck levels. Dose increased, no reported seizures.  Will follow levels   Goal of Therapy:  Phenytoin level 15-20 Seizure control  Plan:  Continue Phenytoin at 175 mg PT q8 (~7mg /kg/day based on protocol ) Repeat level in 2-4 days   Coben Godshall A. 9/18, PharmD, BCPS, FNKF Clinical Pharmacist [place] Please utilize Amion for appropriate phone number to reach the unit pharmacist [facility])   11/16/2019 8:15 AM

## 2019-11-16 NOTE — Progress Notes (Signed)
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 Patient is living in a assisted living facility and can ambulate by himself. Due to concerns of seizure he was intubated for airway protection and taken to the ICU. Eventually had tracheostomy placed on 8/23. Since then he has had multiple swallowing evaluation which he continues to fail and progressing only very slowly. He's now s/p PEG on 9/7.  Plan for CIR at this time.  Assessment & Plan:   Principal Problem:   Status epilepticus ([place]) Active Problems:   Carotid artery stenosis   Seizure (HCC)   CVA (cerebrovascular accident due to intracerebral hemorrhage) (HCC)/ left frontal lobe   AKI (acute kidney injury) ([place])   Essential hypertension   Type 2 diabetes mellitus with hyperlipidemia (HCC)   Acute on chronic chronic respiratory failure with hypoxemia requiring tracheostomy tube placement and mechanical ventilatory support. -Currently has tracheostomy in place. Continue routine supportive care with frequent suctioning. Advised to use incentive spirometer, Mucinex twice daily, bronchodilators scheduled twice daily and every 6 hours as needed. -Continue with routine oral/tracheostomy care -Cont out of bed to chair as tolerated  Nausea  Vomiting: Resolved Recent CT abdomen pelvis without acute intraabdominal abnormalities zofran prn  S/p PEG Placement:  Continue tube feed as tolerated -Pt with residual pain around PEG site. Continue analgesic as already ordered  Status epilepticus in the setting of recent large left MCA stroke -Continue Vimpat, Keppra and Dilantin. - pharmacy adjusting dilantin levels  Fever, improvedlikely in the setting of  pneumonia with concern for aspiration, resolved -Concerns of possible aspiration by previous provider. Procalcitonin negative - recently afebrile - WBC had been trending down -> he's afebrile - CXR 9/10 with atelectasis/infitrate at R base (where endobronchial debris by recent CT) - CT abd/pelvis from 9/9 with atelectasis and/or consolidation of R lower lobe with bronchitic changes  Essential hypertension, uncontrolled -Currently on Norvasc 10 mg daily -continued on hydralazine to 50 units 3 times daily -remains stable at this time  Urinary Retention will start doxazosin, continue bethanecol  Failed TOV 9/9 - 9/10 -> continue foley   AKI, improved Creatinine recently starting to rise mildly, continue to monitor Renal function now improved Baseline appears to be around 1.2 Recheck bmet in AM  Diabetes type 2 with Hyperglycemia -Continue Accu-Cheks and sliding scale -NovoLog 2 units every 4 hours,continue bedtime Lantus -Glycemic trends currently stable  Dysphagia. S/p peg placement  Normocytic Anemia -Hemoglobin is currently stable  Hx of Left MCA stroke with residual aphasia and right hemiparesis  -Continue with Aspirin Plavix, Lipitor 80 mg daily as tolerated  Right arm swelling  -Likely from superficial thrombosis, recommend warm compresses  Abnormal LFT's -In the setting of acute illness, continue to monitor -stable at this time, will cont to follow for now  DVT prophylaxis: Lovenox subq Code Status: Full Family Communication: Pt in room, family not at bedside  Status is: Inpatient  Remains inpatient appropriate because:Unsafe d/c plan   Dispo: The patient is from: Home              Anticipated d/c is to: SNF              Anticipated d/c date is: 3 days  Patient currently is not medically stable to d/c.  Consultants:   PCCM  Neurology  Procedures:  PEG 9/7 ETT Trach 8/19  Antimicrobials: Anti-infectives (From admission,  onward)   Start     Dose/Rate Route Frequency Ordered Stop   11/10/19 1815  ceFAZolin (ANCEF) IVPB 2g/100 mL premix        2 g 200 mL/hr over 30 Minutes Intravenous On call 11/10/19 1808 11/10/19 2104   11/10/19 1551  ceFAZolin (ANCEF) 2-4 GM/100ML-% IVPB       Note to Pharmacy: Yolanda Manges   : cabinet override      11/10/19 1551 11/10/19 1555   11/10/19 0000  ceFAZolin (ANCEF) IVPB 2g/100 mL premix        2 g 200 mL/hr over 30 Minutes Intravenous Once 11/09/19 1220 11/10/19 0027   10/30/19 1400  cefTRIAXone (ROCEPHIN) 2 g in sodium chloride 0.9 % 100 mL IVPB  Status:  Discontinued        2 g 200 mL/hr over 30 Minutes Intravenous Every 24 hours 10/30/19 1251 11/04/19 1358   10/18/19 2300  vancomycin (VANCOREADY) IVPB 750 mg/150 mL  Status:  Discontinued        750 mg 150 mL/hr over 60 Minutes Intravenous Every 24 hours 10/18/19 1359 10/19/19 1217   10/18/19 0200  vancomycin (VANCOREADY) IVPB 500 mg/100 mL  Status:  Discontinued        500 mg 100 mL/hr over 60 Minutes Intravenous Every 12 hours 10/17/19 1242 10/18/19 1359   10/17/19 1245  vancomycin (VANCOCIN) IVPB 1000 mg/200 mL premix        1,000 mg 200 mL/hr over 60 Minutes Intravenous  Once 10/17/19 1242 10/17/19 1441   10/17/19 1245  piperacillin-tazobactam (ZOSYN) IVPB 3.375 g        3.375 g 12.5 mL/hr over 240 Minutes Intravenous Every 8 hours 10/17/19 1242 10/24/19 2359   10/17/19 0900  ampicillin-sulbactam (UNASYN) 1.5 g in sodium chloride 0.9 % 100 mL IVPB  Status:  Discontinued        1.5 g 200 mL/hr over 30 Minutes Intravenous Every 6 hours 10/17/19 0809 10/17/19 0850   10/17/19 0900  Ampicillin-Sulbactam (UNASYN) 3 g in sodium chloride 0.9 % 100 mL IVPB  Status:  Discontinued        3 g 200 mL/hr over 30 Minutes Intravenous Every 6 hours 10/17/19 0850 10/17/19 1242   10/14/19 0600  vancomycin (VANCOREADY) IVPB 500 mg/100 mL  Status:  Discontinued        500 mg 100 mL/hr over 60 Minutes Intravenous Every 12 hours  10/14/19 0057 10/16/19 1534   10/12/19 1600  ampicillin (OMNIPEN) 2 g in sodium chloride 0.9 % 100 mL IVPB  Status:  Discontinued        2 g 300 mL/hr over 20 Minutes Intravenous Every 6 hours 10/12/19 1400 10/14/19 0934   10/12/19 0000  vancomycin (VANCOREADY) IVPB 750 mg/150 mL  Status:  Discontinued        750 mg 150 mL/hr over 60 Minutes Intravenous Every 12 hours 10/11/19 1254 10/14/19 0046   10/11/19 1230  cefTRIAXone (ROCEPHIN) 2 g in sodium chloride 0.9 % 100 mL IVPB  Status:  Discontinued        2 g 200 mL/hr over 30 Minutes Intravenous Every 12 hours 10/11/19 1133 10/16/19 1534   10/11/19 1230  ampicillin (OMNIPEN) 2 g in sodium chloride 0.9 % 100 mL IVPB  Status:  Discontinued        2 g 300  mL/hr over 20 Minutes Intravenous Every 4 hours 10/11/19 1133 10/12/19 1400   10/11/19 1200  vancomycin (VANCOREADY) IVPB 1500 mg/300 mL        1,500 mg 150 mL/hr over 120 Minutes Intravenous  Once 10/11/19 1135 10/11/19 1419   10/11/19 1145  vancomycin (VANCOCIN) IVPB 1000 mg/200 mL premix  Status:  Discontinued        1,000 mg 200 mL/hr over 60 Minutes Intravenous  Once 10/11/19 1133 10/11/19 1135      Subjective: Denies abd pain. No complaints today. Reports feeling better  Objective: Vitals:   11/16/19 1115 11/16/19 1144 11/16/19 1342 11/16/19 1531  BP: 138/61  (!) 145/76 (!) 141/71  Pulse: 77   76  Resp: '16 18  20  ' Temp: 97.8 F (36.6 C)   98.3 F (36.8 C)  TempSrc: Oral   Oral  SpO2: 100%   100%  Weight:      Height:        Intake/Output Summary (Last 24 hours) at 11/16/2019 1643 Last data filed at 11/16/2019 1622 Gross per 24 hour  Intake 100 ml  Output 2225 ml  Net -2125 ml   Filed Weights   11/10/19 0326 11/11/19 0500 11/12/19 0500  Weight: 72.7 kg 71.6 kg 70.8 kg    Examination: General exam: Conversant, in no acute distress Respiratory system: normal chest rise, clear, no audible wheezing Cardiovascular system: regular rhythm, s1-s2 Gastrointestinal  system: Nondistended, nontender, pos BS, PEG in place Central nervous system: No seizures, no tremors Extremities: No cyanosis, no joint deformities Skin: No rashes, no pallor Psychiatry: Affect normal // no auditory hallucinations   Data Reviewed: I have personally reviewed following labs and imaging studies  CBC: Recent Labs  Lab 11/12/19 1016 11/13/19 0211 11/14/19 1008 11/15/19 0150 11/16/19 0250  WBC 14.3* 12.8* 10.2 7.0 10.5  NEUTROABS  --   --  7.7  --   --   HGB 10.2* 9.8* 8.8* 7.8* 9.4*  HCT 31.3* 29.8* 27.7* 24.9* 29.1*  MCV 91.8 92.8 94.2 92.9 93.9  PLT 410* 314 295 274 201   Basic Metabolic Panel: Recent Labs  Lab 11/10/19 1039 11/10/19 1039 11/11/19 0330 11/11/19 0330 11/12/19 1016 11/13/19 0211 11/14/19 1008 11/15/19 0150 11/16/19 0250  NA 140   < > 140   < > 144 141 140 136 138  K 3.6   < > 3.1*   < > 3.0* 3.3* 3.5 3.8 3.9  CL 101   < > 99   < > 104 102 106 103 105  CO2 27   < > 29   < > '25 26 24 22 22  ' GLUCOSE 178*   < > 97   < > 126* 174* 167* 89 58*  BUN 32*   < > 35*   < > 31* 29* 27* 27* 23*  CREATININE 1.27*   < > 1.43*   < > 1.46* 1.47* 1.38* 1.36* 1.26*  CALCIUM 9.5   < > 9.4   < > 9.1 8.7* 8.6* 8.2* 8.4*  MG 2.0  --  2.2  --  2.1 1.9 1.9  --   --   PHOS  --   --   --   --   --  2.5 3.2  --   --    < > = values in this interval not displayed.   GFR: Estimated Creatinine Clearance: 63.2 mL/min (A) (by C-G formula based on SCr of 1.26 mg/dL (H)). Liver Function Tests: Recent Labs  Lab 11/12/19 1016 11/13/19 0211 11/14/19 1008 11/15/19 0150 11/16/19 0250  AST 34 51* 52* 43* 35  ALT 32 40 48* 37 36  ALKPHOS 173* 165* 144* 129* 142*  BILITOT 0.5 0.5 0.4 0.2* 0.3  PROT 7.5 7.1 6.9 6.2* 7.0  ALBUMIN 3.0* 2.8* 2.8* 2.5* 2.8*   No results for input(s): LIPASE, AMYLASE in the last 168 hours. No results for input(s): AMMONIA in the last 168 hours. Coagulation Profile: No results for input(s): INR, PROTIME in the last 168 hours. Cardiac  Enzymes: No results for input(s): CKTOTAL, CKMB, CKMBINDEX, TROPONINI in the last 168 hours. BNP (last 3 results) No results for input(s): PROBNP in the last 8760 hours. HbA1C: No results for input(s): HGBA1C in the last 72 hours. CBG: Recent Labs  Lab 11/16/19 0334 11/16/19 0359 11/16/19 0731 11/16/19 1113 11/16/19 1530  GLUCAP 58* 95 139* 148* 121*   Lipid Profile: No results for input(s): CHOL, HDL, LDLCALC, TRIG, CHOLHDL, LDLDIRECT in the last 72 hours. Thyroid Function Tests: No results for input(s): TSH, T4TOTAL, FREET4, T3FREE, THYROIDAB in the last 72 hours. Anemia Panel: No results for input(s): VITAMINB12, FOLATE, FERRITIN, TIBC, IRON, RETICCTPCT in the last 72 hours. Sepsis Labs: No results for input(s): PROCALCITON, LATICACIDVEN in the last 168 hours.  No results found for this or any previous visit (from the past 240 hour(s)).   Radiology Studies: No results found.  Scheduled Meds: . amLODipine  10 mg Per Tube Daily  . aspirin  81 mg Per Tube Daily  . atorvastatin  80 mg Per Tube Daily  . bethanechol  15 mg Per Tube TID  . chlorhexidine gluconate (MEDLINE KIT)  15 mL Mouth Rinse BID  . Chlorhexidine Gluconate Cloth  6 each Topical Daily  . clopidogrel  75 mg Per Tube Daily  . doxazosin  1 mg Per Tube Daily  . enoxaparin (LOVENOX) injection  40 mg Subcutaneous Q24H  . famotidine  10 mg Per Tube Daily  . feeding supplement (PROSource TF)  45 mL Per Tube BID  . Gerhardt's butt cream   Topical Daily  . hydrALAZINE  50 mg Per Tube Q8H  . insulin aspart  0-15 Units Subcutaneous Q4H  . insulin aspart  2 Units Subcutaneous Q4H  . insulin glargine  12 Units Subcutaneous QHS  . lacosamide  150 mg Per Tube BID  . levETIRAcetam  1,000 mg Per Tube BID  . mouth rinse  15 mL Mouth Rinse q12n4p  . neomycin-bacitracin-polymyxin  1 application Topical Daily  . phenytoin  175 mg Per Tube TID  . QUEtiapine  50 mg Per Tube QHS  . scopolamine  1 patch Transdermal Q72H  .  sennosides  5 mL Per Tube BID   Continuous Infusions: . sodium chloride Stopped (11/14/19 0053)  . feeding supplement (OSMOLITE 1.5 CAL) 1,000 mL (11/16/19 1343)     LOS: 36 days   Marylu Lund, MD [facility] Pager On Amion  If 7PM-7AM, please contact night-coverage 11/16/2019, 4:43 PM

## 2019-11-17 ENCOUNTER — Encounter: Payer: BC Managed Care – PPO | Admitting: Occupational Therapy

## 2019-11-17 ENCOUNTER — Other Ambulatory Visit: Payer: Self-pay

## 2019-11-17 LAB — COMPREHENSIVE METABOLIC PANEL
ALT: 31 U/L (ref 0–44)
AST: 27 U/L (ref 15–41)
Albumin: 2.7 g/dL — ABNORMAL LOW (ref 3.5–5.0)
Alkaline Phosphatase: 147 U/L — ABNORMAL HIGH (ref 38–126)
Anion gap: 10 (ref 5–15)
BUN: 20 mg/dL (ref 6–20)
CO2: 25 mmol/L (ref 22–32)
Calcium: 8.6 mg/dL — ABNORMAL LOW (ref 8.9–10.3)
Chloride: 103 mmol/L (ref 98–111)
Creatinine, Ser: 1.21 mg/dL (ref 0.61–1.24)
GFR calc Af Amer: >60 mL/min (ref >60)
GFR calc non Af Amer: >60 mL/min (ref >60)
Glucose, Bld: 48 mg/dL — ABNORMAL LOW (ref 70–99)
Potassium: 3.4 mmol/L — ABNORMAL LOW (ref 3.5–5.1)
Sodium: 138 mmol/L (ref 135–145)
Total Bilirubin: 0.3 mg/dL (ref 0.3–1.2)
Total Protein: 6.9 g/dL (ref 6.5–8.1)

## 2019-11-17 LAB — CBC
HCT: 27.4 % — ABNORMAL LOW (ref 39.0–52.0)
Hemoglobin: 9.1 g/dL — ABNORMAL LOW (ref 13.0–17.0)
MCH: 30.5 pg (ref 26.0–34.0)
MCHC: 33.2 g/dL (ref 30.0–36.0)
MCV: 91.9 fL (ref 80.0–100.0)
Platelets: 345 10*3/uL (ref 150–400)
RBC: 2.98 MIL/uL — ABNORMAL LOW (ref 4.22–5.81)
RDW: 13.6 % (ref 11.5–15.5)
WBC: 9 10*3/uL (ref 4.0–10.5)
nRBC: 0 % (ref 0.0–0.2)

## 2019-11-17 LAB — GLUCOSE, CAPILLARY
Glucose-Capillary: 105 mg/dL — ABNORMAL HIGH (ref 70–99)
Glucose-Capillary: 134 mg/dL — ABNORMAL HIGH (ref 70–99)
Glucose-Capillary: 146 mg/dL — ABNORMAL HIGH (ref 70–99)
Glucose-Capillary: 156 mg/dL — ABNORMAL HIGH (ref 70–99)
Glucose-Capillary: 197 mg/dL — ABNORMAL HIGH (ref 70–99)
Glucose-Capillary: 203 mg/dL — ABNORMAL HIGH (ref 70–99)
Glucose-Capillary: 50 mg/dL — ABNORMAL LOW (ref 70–99)
Glucose-Capillary: 55 mg/dL — ABNORMAL LOW (ref 70–99)
Glucose-Capillary: 85 mg/dL (ref 70–99)

## 2019-11-17 LAB — HEMOGLOBIN A1C
Hgb A1c MFr Bld: 6.3 % — ABNORMAL HIGH (ref 4.8–5.6)
Mean Plasma Glucose: 134.11 mg/dL

## 2019-11-17 MED ORDER — LIDOCAINE-EPINEPHRINE 1 %-1:100000 IJ SOLN
INTRAMUSCULAR | Status: AC
  Filled 2019-11-17: qty 1

## 2019-11-17 MED ORDER — INSULIN GLARGINE 100 UNIT/ML ~~LOC~~ SOLN
6.0000 [IU] | Freq: Every day | SUBCUTANEOUS | Status: DC
  Administered 2019-11-17 – 2019-11-20 (×4): 6 [IU] via SUBCUTANEOUS
  Filled 2019-11-17 (×5): qty 0.06

## 2019-11-17 MED ORDER — DEXTROSE 50 % IV SOLN
INTRAVENOUS | Status: AC
  Administered 2019-11-17: 50 mL
  Filled 2019-11-17: qty 50

## 2019-11-17 MED ORDER — INSULIN ASPART 100 UNIT/ML ~~LOC~~ SOLN
0.0000 [IU] | SUBCUTANEOUS | Status: DC
  Administered 2019-11-17: 1 [IU] via SUBCUTANEOUS
  Administered 2019-11-17 – 2019-11-18 (×3): 2 [IU] via SUBCUTANEOUS
  Administered 2019-11-18: 1 [IU] via SUBCUTANEOUS
  Administered 2019-11-18 – 2019-11-20 (×12): 2 [IU] via SUBCUTANEOUS
  Administered 2019-11-21: 1 [IU] via SUBCUTANEOUS
  Administered 2019-11-21 (×3): 2 [IU] via SUBCUTANEOUS

## 2019-11-17 NOTE — Progress Notes (Signed)
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 Patient is living in a assisted living facility and can ambulate by himself. Due to concerns of seizure he was intubated for airway protection and taken to the ICU. Eventually had tracheostomy placed on 8/23. Since then he has had multiple swallowing evaluation which he continues to fail and progressing only very slowly. He's now s/p PEG on 9/7.  Plan for CIR at this time.  Assessment & Plan:   Principal Problem:   Status epilepticus ([place]) Active Problems:   Carotid artery stenosis   Seizure (HCC)   CVA (cerebrovascular accident due to intracerebral hemorrhage) (HCC)/ left frontal lobe   AKI (acute kidney injury) ([place])   Essential hypertension   Type 2 diabetes mellitus with hyperlipidemia (HCC)   Acute on chronic chronic respiratory failure with hypoxemia requiring tracheostomy tube placement and mechanical ventilatory support. -Currently has tracheostomy in place. Continue routine supportive care with frequent suctioning. Advised to use incentive spirometer, Mucinex twice daily, bronchodilators scheduled twice daily and every 6 hours as needed. -Continue with routine oral/tracheostomy care -Cont out of bed to chair as tolerated -Stable at this time  Nausea  Vomiting: Resolved Recent CT abdomen pelvis without acute intraabdominal abnormalities Continue with zofran prn  S/p PEG Placement:  Continue tube feed as tolerated -Pain surrounding PEG improved with analgesia -Overnight, pt with bleeding around PEG site. On exam, clots noted with blood soaked through dressing -Staff reports pt had been manipulating tube.  -Appreciate IR re-assessment. Per IR, tube  is OK to use. Recommendation to place binder to protect gastrostomy tube  Status epilepticus in the setting of recent large left MCA stroke -Continue Vimpat, Keppra and Dilantin. - pharmacy adjusting dilantin levels as needed  Fever, improvedlikely in the setting of pneumonia with concern for aspiration, resolved -Concerns of possible aspiration by previous provider. Procalcitonin negative - recently afebrile - WBC had been trending down -> he's afebrile - CXR 9/10 with atelectasis/infitrate at R base (where endobronchial debris by recent CT) - CT abd/pelvis from 9/9 with atelectasis and/or consolidation of R lower lobe with bronchitic changes  Essential hypertension, uncontrolled -Currently on Norvasc 10 mg daily -continued on hydralazine to 50 units 3 times daily -remains stable currently  Urinary Retention will start doxazosin, continue bethanecol  Failed TOV 9/9 - 9/10 -> continue foley   AKI, improved Creatinine recently starting to rise mildly, continue to monitor Renal function now improved Baseline appears to be around 1.2 Recheck bmet in AM  Diabetes type 2 with Hyperglycemia -Continue Accu-Cheks and sliding scale -NovoLog 2 units every 4 hours and bedtime Lantus 12 units -noted to be hypoglycemic overnight. Have decreased SSI coverage to sensitive scale and decreased lantus to 6 units  Dysphagia. S/p peg placement  Normocytic Anemia -Hemoglobin is currently stable  Hx of Left MCA stroke with residual aphasia and right hemiparesis  -Continue with Aspirin Plavix, Lipitor 80 mg daily as tolerated  Right arm swelling  -Likely from superficial thrombosis, recommend warm compresses  Abnormal LFT's -In the setting of acute illness, continue to monitor -stable at this time, will cont to follow for now  DVT prophylaxis: SCD's Code Status: Full Family Communication: Pt in room, family not at bedside  Status is: Inpatient  Remains inpatient  appropriate because:Unsafe d/c plan   Dispo: The patient is from: Home              Anticipated d/c is to: SNF              Anticipated d/c date is: 3 days              Patient currently is not medically stable to d/c.  Consultants:   PCCM  Neurology  Procedures:  PEG 9/7 ETT Trach 8/19  Antimicrobials: Anti-infectives (From admission, onward)   Start     Dose/Rate Route Frequency Ordered Stop   11/10/19 1815  ceFAZolin (ANCEF) IVPB 2g/100 mL premix        2 g 200 mL/hr over 30 Minutes Intravenous On call 11/10/19 1808 11/10/19 2104   11/10/19 1551  ceFAZolin (ANCEF) 2-4 GM/100ML-% IVPB       Note to Pharmacy: Yolanda Manges   : cabinet override      11/10/19 1551 11/10/19 1555   11/10/19 0000  ceFAZolin (ANCEF) IVPB 2g/100 mL premix        2 g 200 mL/hr over 30 Minutes Intravenous Once 11/09/19 1220 11/10/19 0027   10/30/19 1400  cefTRIAXone (ROCEPHIN) 2 g in sodium chloride 0.9 % 100 mL IVPB  Status:  Discontinued        2 g 200 mL/hr over 30 Minutes Intravenous Every 24 hours 10/30/19 1251 11/04/19 1358   10/18/19 2300  vancomycin (VANCOREADY) IVPB 750 mg/150 mL  Status:  Discontinued        750 mg 150 mL/hr over 60 Minutes Intravenous Every 24 hours 10/18/19 1359 10/19/19 1217   10/18/19 0200  vancomycin (VANCOREADY) IVPB 500 mg/100 mL  Status:  Discontinued        500 mg 100 mL/hr over 60 Minutes Intravenous Every 12 hours 10/17/19 1242 10/18/19 1359   10/17/19 1245  vancomycin (VANCOCIN) IVPB 1000 mg/200 mL premix        1,000 mg 200 mL/hr over 60 Minutes Intravenous  Once 10/17/19 1242 10/17/19 1441   10/17/19 1245  piperacillin-tazobactam (ZOSYN) IVPB 3.375 g        3.375 g 12.5 mL/hr over 240 Minutes Intravenous Every 8 hours 10/17/19 1242 10/24/19 2359   10/17/19 0900  ampicillin-sulbactam (UNASYN) 1.5 g in sodium chloride 0.9 % 100 mL IVPB  Status:  Discontinued        1.5 g 200 mL/hr over 30 Minutes Intravenous Every 6 hours 10/17/19 0809 10/17/19 0850    10/17/19 0900  Ampicillin-Sulbactam (UNASYN) 3 g in sodium chloride 0.9 % 100 mL IVPB  Status:  Discontinued        3 g 200 mL/hr over 30 Minutes Intravenous Every 6 hours 10/17/19 0850 10/17/19 1242   10/14/19 0600  vancomycin (VANCOREADY) IVPB 500 mg/100 mL  Status:  Discontinued        500 mg 100 mL/hr over 60 Minutes Intravenous Every 12 hours 10/14/19 0057 10/16/19 1534   10/12/19 1600  ampicillin (OMNIPEN) 2 g in sodium chloride 0.9 % 100 mL IVPB  Status:  Discontinued        2 g 300 mL/hr over 20 Minutes Intravenous Every 6 hours 10/12/19 1400 10/14/19 0934   10/12/19 0000  vancomycin (VANCOREADY) IVPB 750 mg/150 mL  Status:  Discontinued        750 mg 150 mL/hr over 60 Minutes Intravenous Every 12 hours 10/11/19 1254 10/14/19 0046   10/11/19 1230  cefTRIAXone (ROCEPHIN) 2 g in sodium chloride 0.9 %  100 mL IVPB  Status:  Discontinued        2 g 200 mL/hr over 30 Minutes Intravenous Every 12 hours 10/11/19 1133 10/16/19 1534   10/11/19 1230  ampicillin (OMNIPEN) 2 g in sodium chloride 0.9 % 100 mL IVPB  Status:  Discontinued        2 g 300 mL/hr over 20 Minutes Intravenous Every 4 hours 10/11/19 1133 10/12/19 1400   10/11/19 1200  vancomycin (VANCOREADY) IVPB 1500 mg/300 mL        1,500 mg 150 mL/hr over 120 Minutes Intravenous  Once 10/11/19 1135 10/11/19 1419   10/11/19 1145  vancomycin (VANCOCIN) IVPB 1000 mg/200 mL premix  Status:  Discontinued        1,000 mg 200 mL/hr over 60 Minutes Intravenous  Once 10/11/19 1133 10/11/19 1135      Subjective: Denies abd pain or sob. Staff reports bleeding at PEG site overnight  Objective: Vitals:   11/17/19 0343 11/17/19 0726 11/17/19 0743 11/17/19 1143  BP: (!) 132/59  (!) 167/72 (!) 144/64  Pulse: 81 82 76 76  Resp: '18  13 19  ' Temp: 97.9 F (36.6 C)  97.6 F (36.4 C) 98.2 F (36.8 C)  TempSrc: Axillary  Oral Oral  SpO2: 98% 100% 99% 98%  Weight:      Height:        Intake/Output Summary (Last 24 hours) at 11/17/2019  1455 Last data filed at 11/17/2019 5183 Gross per 24 hour  Intake --  Output 2600 ml  Net -2600 ml   Filed Weights   11/10/19 0326 11/11/19 0500 11/12/19 0500  Weight: 72.7 kg 71.6 kg 70.8 kg    Examination: General exam: Awake, laying in bed, in nad Respiratory system: Normal respiratory effort, no wheezing Cardiovascular system: regular rate, s1, s2 Gastrointestinal system: Soft, nondistended, positive BS, PEG site with blood soaked through gauze, clots noted underneath gauze Central nervous system: CN2-12 grossly intact, strength intact Extremities: Perfused, no clubbing Skin: Normal skin turgor, no notable skin lesions seen Psychiatry: Mood normal // no visual hallucinations   Data Reviewed: I have personally reviewed following labs and imaging studies  CBC: Recent Labs  Lab 11/13/19 0211 11/14/19 1008 11/15/19 0150 11/16/19 0250 11/17/19 0347  WBC 12.8* 10.2 7.0 10.5 9.0  NEUTROABS  --  7.7  --   --   --   HGB 9.8* 8.8* 7.8* 9.4* 9.1*  HCT 29.8* 27.7* 24.9* 29.1* 27.4*  MCV 92.8 94.2 92.9 93.9 91.9  PLT 314 295 274 331 358   Basic Metabolic Panel: Recent Labs  Lab 11/11/19 0330 11/11/19 0330 11/12/19 1016 11/12/19 1016 11/13/19 0211 11/14/19 1008 11/15/19 0150 11/16/19 0250 11/17/19 0347  NA 140   < > 144   < > 141 140 136 138 138  K 3.1*   < > 3.0*   < > 3.3* 3.5 3.8 3.9 3.4*  CL 99   < > 104   < > 102 106 103 105 103  CO2 29   < > 25   < > '26 24 22 22 25  ' GLUCOSE 97   < > 126*   < > 174* 167* 89 58* 48*  BUN 35*   < > 31*   < > 29* 27* 27* 23* 20  CREATININE 1.43*   < > 1.46*   < > 1.47* 1.38* 1.36* 1.26* 1.21  CALCIUM 9.4   < > 9.1   < > 8.7* 8.6* 8.2* 8.4* 8.6*  MG 2.2  --  2.1  --  1.9 1.9  --   --   --   PHOS  --   --   --   --  2.5 3.2  --   --   --    < > = values in this interval not displayed.   GFR: Estimated Creatinine Clearance: 65.8 mL/min (by C-G formula based on SCr of 1.21 mg/dL). Liver Function Tests: Recent Labs  Lab  11/13/19 0211 11/14/19 1008 11/15/19 0150 11/16/19 0250 11/17/19 0347  AST 51* 52* 43* 35 27  ALT 40 48* 37 36 31  ALKPHOS 165* 144* 129* 142* 147*  BILITOT 0.5 0.4 0.2* 0.3 0.3  PROT 7.1 6.9 6.2* 7.0 6.9  ALBUMIN 2.8* 2.8* 2.5* 2.8* 2.7*   No results for input(s): LIPASE, AMYLASE in the last 168 hours. No results for input(s): AMMONIA in the last 168 hours. Coagulation Profile: No results for input(s): INR, PROTIME in the last 168 hours. Cardiac Enzymes: No results for input(s): CKTOTAL, CKMB, CKMBINDEX, TROPONINI in the last 168 hours. BNP (last 3 results) No results for input(s): PROBNP in the last 8760 hours. HbA1C: Recent Labs    11/17/19 0347  HGBA1C 6.3*   CBG: Recent Labs  Lab 11/17/19 0414 11/17/19 0417 11/17/19 0510 11/17/19 0823 11/17/19 1239  GLUCAP 50* 55* 146* 105* 85   Lipid Profile: No results for input(s): CHOL, HDL, LDLCALC, TRIG, CHOLHDL, LDLDIRECT in the last 72 hours. Thyroid Function Tests: No results for input(s): TSH, T4TOTAL, FREET4, T3FREE, THYROIDAB in the last 72 hours. Anemia Panel: No results for input(s): VITAMINB12, FOLATE, FERRITIN, TIBC, IRON, RETICCTPCT in the last 72 hours. Sepsis Labs: No results for input(s): PROCALCITON, LATICACIDVEN in the last 168 hours.  No results found for this or any previous visit (from the past 240 hour(s)).   Radiology Studies: No results found.  Scheduled Meds: . amLODipine  10 mg Per Tube Daily  . aspirin  81 mg Per Tube Daily  . atorvastatin  80 mg Per Tube Daily  . bethanechol  15 mg Per Tube TID  . chlorhexidine gluconate (MEDLINE KIT)  15 mL Mouth Rinse BID  . Chlorhexidine Gluconate Cloth  6 each Topical Daily  . clopidogrel  75 mg Per Tube Daily  . doxazosin  1 mg Per Tube Daily  . famotidine  10 mg Per Tube Daily  . feeding supplement (PROSource TF)  45 mL Per Tube BID  . Gerhardt's butt cream   Topical Daily  . hydrALAZINE  50 mg Per Tube Q8H  . insulin aspart  0-9 Units  Subcutaneous Q4H  . insulin aspart  2 Units Subcutaneous Q4H  . insulin glargine  6 Units Subcutaneous QHS  . lacosamide  150 mg Per Tube BID  . levETIRAcetam  1,000 mg Per Tube BID  . lidocaine-EPINEPHrine      . mouth rinse  15 mL Mouth Rinse q12n4p  . phenytoin  175 mg Per Tube TID  . QUEtiapine  50 mg Per Tube QHS  . scopolamine  1 patch Transdermal Q72H  . sennosides  5 mL Per Tube BID   Continuous Infusions: . sodium chloride Stopped (11/14/19 0053)  . feeding supplement (OSMOLITE 1.5 CAL) 1,000 mL (11/17/19 1100)     LOS: 37 days   Marylu Lund, MD [facility] Pager On Amion  If 7PM-7AM, please contact night-coverage 11/17/2019, 2:55 PM

## 2019-11-17 NOTE — Progress Notes (Signed)
59 y.o. male inpatient. History of L MCA CVA  with residual aphasia,right sided hemiparesis and dysphagia s/p gastrostomy tube placement of 20 Fr pull through on 9.7.21. Patient seen at bedside for bleeding around exit side. Patient is on plavix and lovenox prophylactic dosage that is currently discontinued. No active bleeding noted at time of the assessment.  Tube is capped. Hgb 9.1.  As patient appears to be physically manipulating gastrostomy tube with his right arm recommend Team place a binder to protect the gastrostomy tube.   Gastrostomy tube is okay to use. Please call IR for any questions or concerns regarding the g-tube.

## 2019-11-18 DIAGNOSIS — R1310 Dysphagia, unspecified: Secondary | ICD-10-CM

## 2019-11-18 LAB — CBC
HCT: 28.1 % — ABNORMAL LOW (ref 39.0–52.0)
Hemoglobin: 9.2 g/dL — ABNORMAL LOW (ref 13.0–17.0)
MCH: 30.4 pg (ref 26.0–34.0)
MCHC: 32.7 g/dL (ref 30.0–36.0)
MCV: 92.7 fL (ref 80.0–100.0)
Platelets: 324 10*3/uL (ref 150–400)
RBC: 3.03 MIL/uL — ABNORMAL LOW (ref 4.22–5.81)
RDW: 13.6 % (ref 11.5–15.5)
WBC: 6.4 10*3/uL (ref 4.0–10.5)
nRBC: 0 % (ref 0.0–0.2)

## 2019-11-18 LAB — COMPREHENSIVE METABOLIC PANEL
ALT: 28 U/L (ref 0–44)
AST: 27 U/L (ref 15–41)
Albumin: 2.6 g/dL — ABNORMAL LOW (ref 3.5–5.0)
Alkaline Phosphatase: 134 U/L — ABNORMAL HIGH (ref 38–126)
Anion gap: 10 (ref 5–15)
BUN: 18 mg/dL (ref 6–20)
CO2: 24 mmol/L (ref 22–32)
Calcium: 8.6 mg/dL — ABNORMAL LOW (ref 8.9–10.3)
Chloride: 101 mmol/L (ref 98–111)
Creatinine, Ser: 1.22 mg/dL (ref 0.61–1.24)
GFR calc Af Amer: >60 mL/min (ref >60)
GFR calc non Af Amer: >60 mL/min (ref >60)
Glucose, Bld: 205 mg/dL — ABNORMAL HIGH (ref 70–99)
Potassium: 3.5 mmol/L (ref 3.5–5.1)
Sodium: 135 mmol/L (ref 135–145)
Total Bilirubin: 0.2 mg/dL — ABNORMAL LOW (ref 0.3–1.2)
Total Protein: 6.4 g/dL — ABNORMAL LOW (ref 6.5–8.1)

## 2019-11-18 LAB — GLUCOSE, CAPILLARY
Glucose-Capillary: 105 mg/dL — ABNORMAL HIGH (ref 70–99)
Glucose-Capillary: 130 mg/dL — ABNORMAL HIGH (ref 70–99)
Glucose-Capillary: 164 mg/dL — ABNORMAL HIGH (ref 70–99)
Glucose-Capillary: 165 mg/dL — ABNORMAL HIGH (ref 70–99)
Glucose-Capillary: 170 mg/dL — ABNORMAL HIGH (ref 70–99)
Glucose-Capillary: 173 mg/dL — ABNORMAL HIGH (ref 70–99)

## 2019-11-18 MED ORDER — POTASSIUM CHLORIDE 20 MEQ/15ML (10%) PO SOLN
20.0000 meq | Freq: Once | ORAL | Status: AC
  Administered 2019-11-18: 20 meq
  Filled 2019-11-18: qty 15

## 2019-11-18 NOTE — Progress Notes (Signed)
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 Patient is living in a assisted living facility and can ambulate by himself. Due to concerns of seizure he was intubated for airway protection and taken to the ICU. Eventually had tracheostomy placed on 8/23. Since then he has had multiple swallowing evaluation which he continues to fail and progressing only very slowly. He's now s/p PEG on 9/7.  Plan for CIR at this time.  Assessment & Plan:   Principal Problem:   Status epilepticus ([place]) Active Problems:   Carotid artery stenosis   Seizure (HCC)   CVA (cerebrovascular accident due to intracerebral hemorrhage) (HCC)/ left frontal lobe   AKI (acute kidney injury) ([place])   Essential hypertension   Type 2 diabetes mellitus with hyperlipidemia (HCC)   Acute on chronic chronic respiratory failure with hypoxemia requiring tracheostomy tube placement and mechanical ventilatory support. -Currently has tracheostomy in place. Continue routine supportive care with frequent suctioning. Advised to use incentive spirometer, Mucinex twice daily, bronchodilators scheduled twice daily and every 6 hours as needed. -Continue with routine oral/tracheostomy care.  It appears that patient was changed over to a #6 Shiley flexible cuffless trach on 8/30. -Cont out of bed to chair as tolerated  patient remains stable from a respiratory standpoint.  Nausea  Vomiting: Resolved Recent CT abdomen pelvis without acute intraabdominal abnormalities Continue with zofran prn  S/p PEG Placement:  Continue tube feed as tolerated -Pain surrounding PEG improved with analgesia -Yesterday patient noted to have bleeding around the PEG site.   Apparently he had some clots.  He reports that patient had been manipulating the tube.  Interventional radiology evaluated patient.  Tube was thought to be in the good position and okay to use.  A binder was placed to protect the tube. Hemoglobin remains stable.  Status epilepticus in the setting of recent large left MCA stroke -Continue Vimpat, Keppra and Dilantin. - pharmacy adjusting dilantin levels as needed No recent seizure activity.  Fever, improvedlikely in the setting of pneumonia with concern for aspiration, resolved -Concerns of possible aspiration by previous provider. Procalcitonin negative - recently afebrile - WBC had been trending down -> he's afebrile - CXR 9/10 with atelectasis/infitrate at R base (where endobronchial debris by recent CT) - CT abd/pelvis from 9/9 with atelectasis and/or consolidation of R lower lobe with bronchitic changes  Essential hypertension -Currently on Norvasc 10 mg daily -continued on hydralazine to 50 units 3 times daily Blood pressure seems to be reasonably well controlled.  Urinary Retention will start doxazosin, continue bethanecol  Failed voiding trial 9/9 - 9/10 -> continue foley   AKI, improved Seems to be improved.  Monitor urine output.  Could be related to urinary retention.  Diabetes type 2 with Hyperglycemia -Continue Accu-Cheks and sliding scale -NovoLog 2 units every 4 hours and bedtime Lantus 12 units Sliding scale coverage was adjusted due to hyperglycemia.    Dysphagia. S/p peg placement.  Speech therapy continues to work with the patient.  Normocytic Anemia -Hemoglobin is currently stable  Hx of Left MCA stroke with residual aphasia and right hemiparesis  -Continue with Aspirin Plavix, Lipitor 80 mg daily as tolerated  Right arm swelling  -Likely from superficial thrombosis, recommend warm compresses  Abnormal LFT's -In the setting of  acute illness.  LFTs were normal this morning.  Have been for  the last few days.  DVT prophylaxis: SCD's Code Status: Full Family Communication: Pt in room, family not at bedside Disposition: CIR When medically stable.  Status is: Inpatient  Remains inpatient appropriate because:Unsafe d/c plan   Dispo: The patient is from: Home              Anticipated d/c is to: SNF              Anticipated d/c date is: 3 days              Patient currently is not medically stable to d/c.  Consultants:   PCCM  Neurology  Procedures:  PEG 9/7 ETT Trach 8/19  Antimicrobials: Anti-infectives (From admission, onward)   Start     Dose/Rate Route Frequency Ordered Stop   11/10/19 1815  ceFAZolin (ANCEF) IVPB 2g/100 mL premix        2 g 200 mL/hr over 30 Minutes Intravenous On call 11/10/19 1808 11/10/19 2104   11/10/19 1551  ceFAZolin (ANCEF) 2-4 GM/100ML-% IVPB       Note to Pharmacy: Yolanda Manges   : cabinet override      11/10/19 1551 11/10/19 1555   11/10/19 0000  ceFAZolin (ANCEF) IVPB 2g/100 mL premix        2 g 200 mL/hr over 30 Minutes Intravenous Once 11/09/19 1220 11/10/19 0027   10/30/19 1400  cefTRIAXone (ROCEPHIN) 2 g in sodium chloride 0.9 % 100 mL IVPB  Status:  Discontinued        2 g 200 mL/hr over 30 Minutes Intravenous Every 24 hours 10/30/19 1251 11/04/19 1358   10/18/19 2300  vancomycin (VANCOREADY) IVPB 750 mg/150 mL  Status:  Discontinued        750 mg 150 mL/hr over 60 Minutes Intravenous Every 24 hours 10/18/19 1359 10/19/19 1217   10/18/19 0200  vancomycin (VANCOREADY) IVPB 500 mg/100 mL  Status:  Discontinued        500 mg 100 mL/hr over 60 Minutes Intravenous Every 12 hours 10/17/19 1242 10/18/19 1359   10/17/19 1245  vancomycin (VANCOCIN) IVPB 1000 mg/200 mL premix        1,000 mg 200 mL/hr over 60 Minutes Intravenous  Once 10/17/19 1242 10/17/19 1441   10/17/19 1245  piperacillin-tazobactam (ZOSYN) IVPB 3.375 g        3.375 g 12.5 mL/hr over 240 Minutes Intravenous Every 8 hours 10/17/19 1242 10/24/19 2359    10/17/19 0900  ampicillin-sulbactam (UNASYN) 1.5 g in sodium chloride 0.9 % 100 mL IVPB  Status:  Discontinued        1.5 g 200 mL/hr over 30 Minutes Intravenous Every 6 hours 10/17/19 0809 10/17/19 0850   10/17/19 0900  Ampicillin-Sulbactam (UNASYN) 3 g in sodium chloride 0.9 % 100 mL IVPB  Status:  Discontinued        3 g 200 mL/hr over 30 Minutes Intravenous Every 6 hours 10/17/19 0850 10/17/19 1242   10/14/19 0600  vancomycin (VANCOREADY) IVPB 500 mg/100 mL  Status:  Discontinued        500 mg 100 mL/hr over 60 Minutes Intravenous Every 12 hours 10/14/19 0057 10/16/19 1534   10/12/19 1600  ampicillin (OMNIPEN) 2 g in sodium chloride 0.9 % 100 mL IVPB  Status:  Discontinued        2 g 300 mL/hr over 20 Minutes Intravenous Every 6 hours 10/12/19 1400 10/14/19 0934   10/12/19 0000  vancomycin (VANCOREADY) IVPB 750 mg/150 mL  Status:  Discontinued        750 mg 150 mL/hr over 60 Minutes Intravenous Every 12 hours 10/11/19 1254 10/14/19 0046   10/11/19 1230  cefTRIAXone (ROCEPHIN) 2 g in sodium chloride 0.9 % 100 mL IVPB  Status:  Discontinued        2 g 200 mL/hr over 30 Minutes Intravenous Every 12 hours 10/11/19 1133 10/16/19 1534   10/11/19 1230  ampicillin (OMNIPEN) 2 g in sodium chloride 0.9 % 100 mL IVPB  Status:  Discontinued        2 g 300 mL/hr over 20 Minutes Intravenous Every 4 hours 10/11/19 1133 10/12/19 1400   10/11/19 1200  vancomycin (VANCOREADY) IVPB 1500 mg/300 mL        1,500 mg 150 mL/hr over 120 Minutes Intravenous  Once 10/11/19 1135 10/11/19 1419   10/11/19 1145  vancomycin (VANCOCIN) IVPB 1000 mg/200 mL premix  Status:  Discontinued        1,000 mg 200 mL/hr over 60 Minutes Intravenous  Once 10/11/19 1133 10/11/19 1135      Subjective: No events overnight per nursing staff.  Denies any complaints.  Objective: Vitals:   11/18/19 0500 11/18/19 0743 11/18/19 0904 11/18/19 1137  BP:  (!) 151/75    Pulse: 72 74 88 78  Resp: '16 16 20 ' (!) 24  Temp:  98.5 F  (36.9 C)    TempSrc:  Oral    SpO2: 98% 99% 98% 100%  Weight:      Height:        Intake/Output Summary (Last 24 hours) at 11/18/2019 1232 Last data filed at 11/18/2019 1200 Gross per 24 hour  Intake 4895 ml  Output 1600 ml  Net 3295 ml   Filed Weights   11/10/19 0326 11/11/19 0500 11/12/19 0500  Weight: 72.7 kg 71.6 kg 70.8 kg    Examination:  General appearance: Awake alert.  In no distress Resp: Clear to auscultation bilaterally.  Normal effort Cardio: S1-S2 is normal regular.  No S3-S4.  No rubs murmurs or bruit GI: PEG tube is noted.  Abdomen is soft. Extremities: No edema.  Full range of motion of lower extremities. Neurologic: No obvious focal deficits noted.   Data Reviewed: I have personally reviewed following labs and imaging studies  CBC: Recent Labs  Lab 11/14/19 1008 11/15/19 0150 11/16/19 0250 11/17/19 0347 11/18/19 0217  WBC 10.2 7.0 10.5 9.0 6.4  NEUTROABS 7.7  --   --   --   --   HGB 8.8* 7.8* 9.4* 9.1* 9.2*  HCT 27.7* 24.9* 29.1* 27.4* 28.1*  MCV 94.2 92.9 93.9 91.9 92.7  PLT 295 274 331 345 812   Basic Metabolic Panel: Recent Labs  Lab 11/12/19 1016 11/12/19 1016 11/13/19 0211 11/13/19 0211 11/14/19 1008 11/15/19 0150 11/16/19 0250 11/17/19 0347 11/18/19 0217  NA 144   < > 141   < > 140 136 138 138 135  K 3.0*   < > 3.3*   < > 3.5 3.8 3.9 3.4* 3.5  CL 104   < > 102   < > 106 103 105 103 101  CO2 25   < > 26   < > '24 22 22 25 24  ' GLUCOSE 126*   < > 174*   < > 167* 89 58* 48* 205*  BUN 31*   < > 29*   < > 27* 27* 23* 20 18  CREATININE 1.46*   < > 1.47*   < >  1.38* 1.36* 1.26* 1.21 1.22  CALCIUM 9.1   < > 8.7*   < > 8.6* 8.2* 8.4* 8.6* 8.6*  MG 2.1  --  1.9  --  1.9  --   --   --   --   PHOS  --   --  2.5  --  3.2  --   --   --   --    < > = values in this interval not displayed.   GFR: Estimated Creatinine Clearance: 65.3 mL/min (by C-G formula based on SCr of 1.22 mg/dL). Liver Function Tests: Recent Labs  Lab 11/14/19 1008  11/15/19 0150 11/16/19 0250 11/17/19 0347 11/18/19 0217  AST 52* 43* 35 27 27  ALT 48* 37 36 31 28  ALKPHOS 144* 129* 142* 147* 134*  BILITOT 0.4 0.2* 0.3 0.3 0.2*  PROT 6.9 6.2* 7.0 6.9 6.4*  ALBUMIN 2.8* 2.5* 2.8* 2.7* 2.6*   HbA1C: Recent Labs    11/17/19 0347  HGBA1C 6.3*   CBG: Recent Labs  Lab 11/17/19 1620 11/17/19 2047 11/17/19 2358 11/18/19 0408 11/18/19 0755  GLUCAP 134* 156* 197* 105* 130*     Radiology Studies: No results found.  Scheduled Meds: . amLODipine  10 mg Per Tube Daily  . aspirin  81 mg Per Tube Daily  . atorvastatin  80 mg Per Tube Daily  . bethanechol  15 mg Per Tube TID  . chlorhexidine gluconate (MEDLINE KIT)  15 mL Mouth Rinse BID  . Chlorhexidine Gluconate Cloth  6 each Topical Daily  . clopidogrel  75 mg Per Tube Daily  . doxazosin  1 mg Per Tube Daily  . famotidine  10 mg Per Tube Daily  . feeding supplement (PROSource TF)  45 mL Per Tube BID  . Gerhardt's butt cream   Topical Daily  . hydrALAZINE  50 mg Per Tube Q8H  . insulin aspart  0-9 Units Subcutaneous Q4H  . insulin aspart  2 Units Subcutaneous Q4H  . insulin glargine  6 Units Subcutaneous QHS  . lacosamide  150 mg Per Tube BID  . levETIRAcetam  1,000 mg Per Tube BID  . mouth rinse  15 mL Mouth Rinse q12n4p  . phenytoin  175 mg Per Tube TID  . QUEtiapine  50 mg Per Tube QHS  . scopolamine  1 patch Transdermal Q72H  . sennosides  5 mL Per Tube BID   Continuous Infusions: . sodium chloride Stopped (11/14/19 0053)  . feeding supplement (OSMOLITE 1.5 CAL) 1,000 mL (11/18/19 1123)     LOS: 38 days   Bonnielee Haff, MD [facility] Pager On [place]  If 7PM-7AM, please contact night-coverage 11/18/2019, 12:32 PM

## 2019-11-18 NOTE — Progress Notes (Signed)
Occupational Therapy Treatment Patient Details Name: Corey Bennett MRN: [id number] DOB: 1960-07-26 Today's Date: 11/18/2019    History of present illness 59 y.o. 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  He was intubated for airway protection on 10/10/19. Pt extubated on 10/14/19.  on 8/14, pt had continued twitching of Rt UE with AMS, and decreased 02 sats.  He was intubated.  Diagnosis: status epilepticus in setting of prior large Lt MCA infarct; acute kidney injury, possible acute meningitis.  He underwent tracheostomy on 8/19/2.  PMH includes: poorly controlled DM, dysphagia, HTN.  PEG  11/10/19.   OT comments  Pt demonstrates during session moments of frustration but with encouragement easily engages in task. Pt with R LE pain and R wrist pain noted throughout session. Pt tolerated PMV with session. Pt total +2 mod (A) for basic transfer with L bias. Continue to recommend CIR for d/c planning.   Follow Up Recommendations  Supervision/Assistance - 24 hour;CIR    Equipment Recommendations  Other (comment)    Recommendations for Other Services Rehab consult    Precautions / Restrictions Precautions Precautions: Fall Precaution Comments: trach, PEG (abdominal binder)       Mobility Bed Mobility Overal bed mobility: Needs Assistance Bed Mobility: Supine to Sit     Supine to sit: Supervision     General bed mobility comments: supervision for lines and safety; vc to complete task after stopping midway  Transfers Overall transfer level: Needs assistance   Transfers: Sit to/from Stand Sit to Stand: Mod assist (due to posterior LOB)         General transfer comment: stood up quickly with posterior sway requiring up to mod assist to recover due (no righting reaction)    Balance Overall balance assessment: Needs assistance Sitting-balance support: No upper extremity  supported;Feet supported Sitting balance-Leahy Scale: Good     Standing balance support: No upper extremity supported Standing balance-Leahy Scale: Poor Standing balance comment: standing at sink and removed LUE support from counter with pt immediately with posterior lean/LOB                           ADL either performed or assessed with clinical judgement   ADL Overall ADL's : Needs assistance/impaired Eating/Feeding: NPO   Grooming: Maximal assistance;Standing (total +2 mod (A) with posterior lean)                   Toilet Transfer: +2 for physical assistance;Moderate assistance Toilet Transfer Details (indicate cue type and reason): simualted           General ADL Comments: pt reports "no to alot of questions     Vision       Perception     Praxis      Cognition Arousal/Alertness: Awake/alert Behavior During Therapy:  (Frustrated/annoyed by communication difficulties) Overall Cognitive Status: Difficult to assess Area of Impairment: Following commands;Safety/judgement;Awareness;Problem solving                       Following Commands: Follows one step commands consistently Safety/Judgement: Decreased awareness of safety;Decreased awareness of deficits Awareness: Intellectual Problem Solving: Requires verbal cues;Requires tactile cues General Comments: pt follows one step commands consistently, reduced awareness of deficits (especially posterior LOB)        Exercises Exercises: Other exercises Other Exercises Other Exercises: Attempted to assess  cause of RLE pain during gait; pt responds "yes!" when asked if painful, yet could not point to where he hurt. In sitting, able to perform full PROM rt knee, ankle, toes. Removed heel protecting foam bandgage from rt heel and noted intact blister over posterior portion of calcaneous (not the WB'g surface).  Other Exercises: wear schedule for R wrist splint 4 hours on 2 hours off   Shoulder  Instructions       General Comments VSS ( PMV placed)    Pertinent Vitals/ Pain       Pain Assessment: Faces Faces Pain Scale: Hurts whole lot Pain Location: nods yes to pain in RLE but unable to clarify where ( Pain Descriptors / Indicators: Grimacing;Guarding Pain Intervention(s): Limited activity within patient's tolerance  Home Living                                          Prior Functioning/Environment              Frequency  Min 2X/week        Progress Toward Goals  OT Goals(current goals can now be found in the care plan section)  Progress towards OT goals: Progressing toward goals  Acute Rehab OT Goals Patient Stated Goal: "NO"  OT Goal Formulation: Patient unable to participate in goal setting Time For Goal Achievement: 12/02/19 Potential to Achieve Goals: Good ADL Goals Pt Will Perform Grooming: with min guard assist;standing Pt Will Perform Upper Body Bathing: with supervision;with set-up;sitting Pt Will Perform Lower Body Bathing: with min guard assist;sit to/from stand Pt Will Perform Upper Body Dressing: with set-up;with supervision;sitting Pt Will Perform Lower Body Dressing: with min guard assist;sit to/from stand Pt Will Transfer to Toilet: with min assist;ambulating;regular height toilet;bedside commode;grab bars Pt Will Perform Toileting - Clothing Manipulation and hygiene: with min guard assist;sit to/from stand Additional ADL Goal #1: Patient will demonstrate ability to complete SROM exercises to R UE with minimal cueing to decrease risk of further contractures.  Plan Discharge plan remains appropriate    Co-evaluation    PT/OT/SLP Co-Evaluation/Treatment: Yes Reason for Co-Treatment: Complexity of the patient's impairments (multi-system involvement);Necessary to address cognition/behavior during functional activity;For patient/therapist safety;To address functional/ADL transfers PT goals addressed during session:  Mobility/safety with mobility;Balance;Strengthening/ROM OT goals addressed during session: ADL's and self-care;Proper use of Adaptive equipment and DME;Strengthening/ROM      AM-PAC OT "6 Clicks" Daily Activity     Outcome Measure   Help from another person eating meals?: A Lot Help from another person taking care of personal grooming?: A Lot Help from another person toileting, which includes using toliet, bedpan, or urinal?: A Lot Help from another person bathing (including washing, rinsing, drying)?: A Lot Help from another person to put on and taking off regular upper body clothing?: A Lot Help from another person to put on and taking off regular lower body clothing?: A Lot 6 Click Score: 12    End of Session Equipment Utilized During Treatment: Gait belt;Rolling walker  OT Visit Diagnosis: Hemiplegia and hemiparesis;Cognitive communication deficit (R41.841);Other symptoms and signs involving cognitive function;Other abnormalities of gait and mobility (R26.89);Muscle weakness (generalized) (M62.81) Symptoms and signs involving cognitive functions: Cerebral infarction Hemiplegia - Right/Left: Right Hemiplegia - dominant/non-dominant: Dominant Hemiplegia - caused by: Cerebral infarction   Activity Tolerance Patient tolerated treatment well   Patient Left in chair;with call bell/phone within reach;with chair alarm set;Other (comment) (  SLP Kendal Hymen present with patient)   Nurse Communication Mobility status        Time: [phone] OT Time Calculation (min): 36 min  Charges: OT General Charges $OT Visit: 1 Visit OT Treatments $Self Care/Home Management : 8-22 mins   Brynn, OTR/L  Acute Rehabilitation Services Pager: [phone] Office: [phone] .    Mateo Flow 11/18/2019, 1:22 PM

## 2019-11-18 NOTE — Progress Notes (Signed)
Physical Therapy Treatment Patient Details Name: Corey Bennett MRN: [id number] DOB: 10-Oct-1960 Today's Date: 11/18/2019    History of Present Illness 59 y.o. 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  He was intubated for airway protection on 10/10/19. Pt extubated on 10/14/19.  on 8/14, pt had continued twitching of Rt UE with AMS, and decreased 02 sats.  He was intubated.  Diagnosis: status epilepticus in setting of prior large Lt MCA infarct; acute kidney injury, possible acute meningitis.  He underwent tracheostomy on 8/19/2.  PMH includes: poorly controlled DM, dysphagia, HTN.  PEG  11/10/19.    PT Comments    Patient initially frustrated with therapists as we attended to his lines/tubes (appeared he just wanted to get going!). Patient had brief medical decline after PEG inserted (with N/V, hooked tube to suction), however now able to fully participate with therapy. His balance was slightly worse than prior to PEG--more of a posterior lean/bias that pt required assist to correct. While walking, pt with very limited weight-bearing on RLE with antalgic appearance. Once seated, he stated "yes!" to having pain when walking, but could not point to location and could not discover issue with palpation and ROM of RLE in sitting. Will continue to monitor. Feel patient now doing better medically and appropriate for CIR level therapies.     Follow Up Recommendations  CIR (pt medically now able to fully participate)     Equipment Recommendations  Other (comment) (to be determined next venue)    Recommendations for Other Services       Precautions / Restrictions Precautions Precautions: Fall Precaution Comments: trach, PEG    Mobility  Bed Mobility Overal bed mobility: Needs Assistance Bed Mobility: Supine to Sit     Supine to sit: Supervision     General bed mobility comments:  supervision for lines and safety; vc to complete task after stopping midway  Transfers Overall transfer level: Needs assistance   Transfers: Sit to/from Stand Sit to Stand: Mod assist (due to posterior LOB)         General transfer comment: stood up quickly with posterior sway requiring up to mod assist to recover due (no righting reaction)  Ambulation/Gait Ambulation/Gait assistance: Mod assist;Min assist Gait Distance (Feet): 90 Feet Assistive device: 2 person hand held assist (Pt placed left hand/arm on PTs shoulder; OT on RUE for WB'g ) Gait Pattern/deviations: Decreased dorsiflexion - right;Decreased step length - right;Drifts right/left;Step-through pattern;Decreased weight shift to right;Scissoring;Narrow base of support Gait velocity: reduced   General Gait Details: pt able to widen steps (step RLE more laterally) with verbal and visual cues (provided by SLP who arrived to see pt as ambulating); patient developed antalgic gait (favoring RLE) with rt knee maintained in Flexion    Stairs             Wheelchair Mobility    Modified Rankin (Stroke Patients Only)       Balance Overall balance assessment: Needs assistance Sitting-balance support: No upper extremity supported;Feet supported Sitting balance-Leahy Scale: Good     Standing balance support: No upper extremity supported Standing balance-Leahy Scale: Poor Standing balance comment: standing at sink and removed LUE support from counter with pt immediately with posterior lean/LOB                            Cognition Arousal/Alertness: Awake/alert Behavior  During Therapy:  (Frustrated/annoyed by communication difficulties) Overall Cognitive Status: Difficult to assess Area of Impairment: Following commands;Safety/judgement;Awareness;Problem solving                       Following Commands: Follows one step commands consistently Safety/Judgement: Decreased awareness of  safety;Decreased awareness of deficits Awareness: Intellectual Problem Solving: Requires verbal cues;Requires tactile cues General Comments: pt follows one step commands consistently, reduced awareness of deficits (especially posterior LOB)      Exercises Other Exercises Other Exercises: Attempted to assess cause of RLE pain during gait; pt responds "yes!" when asked if painful, yet could not point to where he hurt. In sitting, able to perform full PROM rt knee, ankle, toes. Removed heel protecting foam bandgage from rt heel and noted intact blister over posterior portion of calcaneous (not the WB'g surface).     General Comments General comments (skin integrity, edema, etc.): VSS on RA (including when SLP placed PMV over trach while walking)      Pertinent Vitals/Pain Pain Assessment: Faces Faces Pain Scale: Hurts whole lot Pain Location: nods yes to pain in RLE but unable to clarify where ( Pain Descriptors / Indicators: Grimacing;Guarding Pain Intervention(s): Limited activity within patient's tolerance;Monitored during session;Repositioned    Home Living                      Prior Function            PT Goals (current goals can now be found in the care plan section) Acute Rehab PT Goals Patient Stated Goal: pt unable to state  Time For Goal Achievement: 11/25/19 Potential to Achieve Goals: Fair Progress towards PT goals: Not progressing toward goals - comment (had regression due to medical issues)    Frequency    Min 3X/week      PT Plan Discharge plan needs to be updated    Co-evaluation PT/OT/SLP Co-Evaluation/Treatment: Yes Reason for Co-Treatment: Complexity of the patient's impairments (multi-system involvement);For patient/therapist safety;To address functional/ADL transfers PT goals addressed during session: Mobility/safety with mobility;Balance;Strengthening/ROM        AM-PAC PT "6 Clicks" Mobility   Outcome Measure  Help needed turning from  your back to your side while in a flat bed without using bedrails?: A Little Help needed moving from lying on your back to sitting on the side of a flat bed without using bedrails?: A Little Help needed moving to and from a bed to a chair (including a wheelchair)?: A Lot Help needed standing up from a chair using your arms (e.g., wheelchair or bedside chair)?: A Lot Help needed to walk in hospital room?: A Lot Help needed climbing 3-5 steps with a railing? 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

## 2019-11-18 NOTE — Progress Notes (Signed)
Inpatient Rehab Admissions Coordinator:   Pt medically stable and therapy recommending CIR. Will start insurance auth tomorrow for possible admission pending approval and bed availability.   Estill Dooms, PT, DPT Admissions Coordinator [phone] 11/18/19  5:07 PM

## 2019-11-18 NOTE — Progress Notes (Signed)
  Speech Language Pathology Treatment: Dysphagia;Cognitive-Linquistic  Patient Details Name: Corey Bennett MRN: [id number] DOB: Sep 17, 1960 Today's Date: 11/18/2019 Time: 3419-6222 SLP Time Calculation (min) (ACUTE ONLY): 26 min  Assessment / Plan / Recommendation Clinical Impression  Pt seen initially during OT/PT session. Assisted with PMSV placement, auditory comprehension using visual, verbal and gestures in mobility tasks. Pt mad choice cues with visual reinforcement, though suspect at times, his verbal choice did not match his intention. He will often state yes or no, but then has to correct the result of his choice with gestures.  Pt had excellent tolerance of PMSV throughout session. Given that it aids in secretions management and verbal expression, pt is recommended to wear it all waking hours. Provided trial of nectar thick liquids via spoon, about 2 oz given with one delayed cough. Revised diet order to include nectar thick liquids teaspoon sips with staff. PMSV should be in place. Will continue efforts.    HPI HPI: Pt is a 59 yo male admitted from ALF with status epilepticus in the setting of recent large L MCA stroke, requiring intubation 8/8-8/11. BSE 8/12 recommended NPO with concern for reduced secretion management and pt was subsequently reintubated 8/14. [name] 8/19. Pt was evaluated by OP SLP 7/26 (after completing CIR in [place] and [facility] SLP), noting "receptive and expressive aphasia (likely severe) and some degree of verbal apraxia." He was awaiting a speech generating device. PMH: CVA with aphasia and hemiparesis, HTN, hypercholesterolemia, diabetes.  Plans for PEG placement 9/7      SLP Plan  Continue with current plan of care       Recommendations  Diet recommendations: Nectar-thick liquid Liquids provided via: Teaspoon Compensations: Clear throat intermittently      Patient may use Passy-Muir Speech Valve: During all waking hours (remove during sleep);During PO  intake/meals PMSV Supervision: Intermittent         Oral Care Recommendations: Oral care BID Follow up Recommendations: Skilled Nursing facility Plan: Continue with current plan of care       GO                Jalonda Antigua, Riley Nearing 11/18/2019, 1:33 PM

## 2019-11-19 ENCOUNTER — Encounter: Payer: BC Managed Care – PPO | Admitting: Occupational Therapy

## 2019-11-19 ENCOUNTER — Encounter: Payer: BC Managed Care – PPO | Admitting: Speech Pathology

## 2019-11-19 LAB — GLUCOSE, CAPILLARY
Glucose-Capillary: 154 mg/dL — ABNORMAL HIGH (ref 70–99)
Glucose-Capillary: 160 mg/dL — ABNORMAL HIGH (ref 70–99)
Glucose-Capillary: 168 mg/dL — ABNORMAL HIGH (ref 70–99)
Glucose-Capillary: 198 mg/dL — ABNORMAL HIGH (ref 70–99)
Glucose-Capillary: 109 mg/dL — ABNORMAL HIGH (ref 70–99)
Glucose-Capillary: 159 mg/dL — ABNORMAL HIGH (ref 70–99)

## 2019-11-19 NOTE — Progress Notes (Signed)
Inpatient Rehab Admissions Coordinator:   Met with pt at bedside.  He is ready for CIR and anxious to begin.  I let him know that insurance authorization process has been started and we will continue to follow.    Shann Medal, PT, DPT Admissions Coordinator [phone] 11/19/19  12:10 PM

## 2019-11-19 NOTE — Progress Notes (Signed)
Nutrition Follow-up  DOCUMENTATION CODES:   Not applicable  INTERVENTION:  Continue Tube Feeding via G-tube:  Osmolite 1.5at 9m/hr(1320ml per day) Prosource [id number]  Provides2060kcal, 105gm protein, 100646mree water daily   NUTRITION DIAGNOSIS:   Inadequate oral intake related to inability to eat as evidenced by NPO status.  Ongoing  GOAL:   Patient will meet greater than or equal to 90% of their needs  Met with TF  MONITOR:   Diet advancement, Labs, Weight trends, TF tolerance, Skin, I & O's  REASON FOR ASSESSMENT:   Ventilator    ASSESSMENT:   58 86o. 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  He was intubated for airway protection and status and PCCM consulted for admission  8/11- extubated 8/12- s/p BSE- recommend continue NPO 8/13- cortrak tube placed (tip of tube in stomach), TF initiated 8/14- re-intubated; seizure activity 8/17- rectal tube placed 8/20 - trach placed 8/31- s/p MBSS- recommend continue NPO 9/07- IR placed 92F G-tube 9/08- Episodes of emesis post G-tube 9/09- CT abdomen negative for acute process; TF resumed  Pt found to be manipulating PEG tube, causing it to bleed. Per IR, tube is OK to use and a binder was placed to protect the tube. SLP continues to work with pt. Pt pending d/c to CIR.   Current TF: Osmolite 1.5at 60m30m(1320ml per day) with Prosource [id number]. This provides2060kcal, 105gm protein, 1006ml 86m water daily  Labs: CBGs [id number] Medications: Pepcid, Novolog, Lantus, Senokot  Diet Order:   Diet Order            Diet NPO time specified  Diet effective midnight                 EDUCATION NEEDS:   No education needs have been identified at this time  Skin:  Skin Assessment: Reviewed RN Assessment  Last BM:  9/12  Height:   Ht Readings from Last 1 Encounters:   10/10/19 _0  (1.854 m)    Weight:   Wt Readings from Last 1 Encounters:  11/19/19 75 kg    Ideal Body Weight:  83.6 kg  BMI:  Body mass index is 21.81 kg/m.  Estimated Nutritional Needs:   Kcal:  1900-2100  Protein:  100-115 grams  Fluid:  > 2 L    AmandaLarkin InaRD, LDN RD pager number and weekend/on-call pager number located in Amion.[place]

## 2019-11-19 NOTE — Progress Notes (Signed)
PROGRESS NOTE    Corey Bennett  QXI:[id number] DOB: [birth year] DOA: 10/10/2019 PCP: Patient, No Pcp Per    Brief Narrative:  59 year old male with past medical history of 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 Patient is living in a assisted living facility and can ambulate by himself. Due to concerns of seizure he was intubated for airway protection and taken to the ICU. Eventually had tracheostomy placed on 8/23. Since then he has had multiple swallowing evaluation which he continues to fail and progressing only very slowly. He's now s/p PEG on 9/7.  Plan for CIR at this time.  Assessment & Plan:   Principal Problem:   Status epilepticus ([place]) Active Problems:   Carotid artery stenosis   Seizure (HCC)   CVA (cerebrovascular accident due to intracerebral hemorrhage) (HCC)/ left frontal lobe   AKI (acute kidney injury) ([place])   Essential hypertension   Type 2 diabetes mellitus with hyperlipidemia (HCC)   Acute on chronic chronic respiratory failure with hypoxemia requiring tracheostomy tube placement and mechanical ventilatory support. -Currently has tracheostomy in place. Continue to use incentive spirometer, Mucinex twice daily, bronchodilators scheduled twice daily and every 6 hours as needed. -Continue with routine oral/tracheostomy care.  It appears that patient was changed over to a #6 Shiley flexible cuffless trach on 8/30. Patient seems to be stable from a respiratory standpoint.  Nausea  Vomiting: Resolved Recent CT abdomen pelvis without acute intraabdominal abnormalities Continue with zofran prn  S/p PEG Placement:  -Pain surrounding PEG improved with analgesia -On 9/14 patient noted to have bleeding around the PEG site.  Apparently he had some clots.  Nursing reported that patient had been manipulating the tube.  Interventional radiology evaluated  patient.  Tube was thought to be in the good position and okay to use.  A binder was placed to protect the tube. No overt bleeding noted.  Hemoglobin has been stable.  Recheck labs tomorrow.  Status epilepticus in the setting of recent large left MCA stroke -Continue Vimpat, Keppra and Dilantin. No recent seizure activity.  Fever, improvedlikely in the setting of pneumonia with concern for aspiration, resolved CXR 9/10 with atelectasis/infitrate at R base (where endobronchial debris by recent CT). CT abd/pelvis from 9/9 with atelectasis and/or consolidation of R lower lobe with bronchitic changes. WBC is normal.  Patient remains afebrile.  Essential hypertension -Currently on Norvasc 10 mg daily -continued on hydralazine to 50 units 3 times daily Occasional high readings noted but blood pressure stable for the most part.  Urinary Retention Continue doxazosin and bethanechol.  Failed voiding trial 9/9 - 9/10 -> continue foley   AKI, improved Seems to be improved.  Monitor urine output.  Could be related to urinary retention.  Diabetes type 2 with Hyperglycemia HbA1c 6.3.  Continue monitoring CBGs.  Patient currently on SSI and Lantus coverage.  Reasonably well-controlled at this time.  Dysphagia. S/p peg placement.  Speech therapy continues to work with the patient.  Remains NPO.  Spoons of nectar thick liquids being given at times.  Normocytic Anemia Stable.  No overt bleeding except for bleeding around the PEG tube noticed a few days ago which has since resolved.  Hx of Left MCA stroke with residual aphasia and right hemiparesis  -Continue with Aspirin Plavix, Lipitor 80 mg daily as tolerated  Right arm swelling  -Likely from superficial thrombosis, recommend warm compresses  Abnormal LFT's -In the setting of acute illness.  LFTs  were normal when last checked.    DVT prophylaxis: SCD's Code Status: Full Family Communication: No family at bedside Disposition:  CIR When medically stable.  Status is: Inpatient  Remains inpatient appropriate because:Unsafe d/c plan   Dispo: The patient is from: Home              Anticipated d/c is to: CIR              Anticipated d/c date is: Insurance authorization in progress.              Patient currently is not medically stable to d/c.  Consultants:   PCCM  Neurology  Procedures:  PEG 9/7 ETT Trach 8/19  Antimicrobials: Anti-infectives (From admission, onward)   Start     Dose/Rate Route Frequency Ordered Stop   11/10/19 1815  ceFAZolin (ANCEF) IVPB 2g/100 mL premix        2 g 200 mL/hr over 30 Minutes Intravenous On call 11/10/19 1808 11/10/19 2104   11/10/19 1551  ceFAZolin (ANCEF) 2-4 GM/100ML-% IVPB       Note to Pharmacy: Yolanda Manges   : cabinet override      11/10/19 1551 11/10/19 1555   11/10/19 0000  ceFAZolin (ANCEF) IVPB 2g/100 mL premix        2 g 200 mL/hr over 30 Minutes Intravenous Once 11/09/19 1220 11/10/19 0027   10/30/19 1400  cefTRIAXone (ROCEPHIN) 2 g in sodium chloride 0.9 % 100 mL IVPB  Status:  Discontinued        2 g 200 mL/hr over 30 Minutes Intravenous Every 24 hours 10/30/19 1251 11/04/19 1358   10/18/19 2300  vancomycin (VANCOREADY) IVPB 750 mg/150 mL  Status:  Discontinued        750 mg 150 mL/hr over 60 Minutes Intravenous Every 24 hours 10/18/19 1359 10/19/19 1217   10/18/19 0200  vancomycin (VANCOREADY) IVPB 500 mg/100 mL  Status:  Discontinued        500 mg 100 mL/hr over 60 Minutes Intravenous Every 12 hours 10/17/19 1242 10/18/19 1359   10/17/19 1245  vancomycin (VANCOCIN) IVPB 1000 mg/200 mL premix        1,000 mg 200 mL/hr over 60 Minutes Intravenous  Once 10/17/19 1242 10/17/19 1441   10/17/19 1245  piperacillin-tazobactam (ZOSYN) IVPB 3.375 g        3.375 g 12.5 mL/hr over 240 Minutes Intravenous Every 8 hours 10/17/19 1242 10/24/19 2359   10/17/19 0900  ampicillin-sulbactam (UNASYN) 1.5 g in sodium chloride 0.9 % 100 mL IVPB  Status:   Discontinued        1.5 g 200 mL/hr over 30 Minutes Intravenous Every 6 hours 10/17/19 0809 10/17/19 0850   10/17/19 0900  Ampicillin-Sulbactam (UNASYN) 3 g in sodium chloride 0.9 % 100 mL IVPB  Status:  Discontinued        3 g 200 mL/hr over 30 Minutes Intravenous Every 6 hours 10/17/19 0850 10/17/19 1242   10/14/19 0600  vancomycin (VANCOREADY) IVPB 500 mg/100 mL  Status:  Discontinued        500 mg 100 mL/hr over 60 Minutes Intravenous Every 12 hours 10/14/19 0057 10/16/19 1534   10/12/19 1600  ampicillin (OMNIPEN) 2 g in sodium chloride 0.9 % 100 mL IVPB  Status:  Discontinued        2 g 300 mL/hr over 20 Minutes Intravenous Every 6 hours 10/12/19 1400 10/14/19 0934   10/12/19 0000  vancomycin (VANCOREADY) IVPB 750 mg/150 mL  Status:  Discontinued  750 mg 150 mL/hr over 60 Minutes Intravenous Every 12 hours 10/11/19 1254 10/14/19 0046   10/11/19 1230  cefTRIAXone (ROCEPHIN) 2 g in sodium chloride 0.9 % 100 mL IVPB  Status:  Discontinued        2 g 200 mL/hr over 30 Minutes Intravenous Every 12 hours 10/11/19 1133 10/16/19 1534   10/11/19 1230  ampicillin (OMNIPEN) 2 g in sodium chloride 0.9 % 100 mL IVPB  Status:  Discontinued        2 g 300 mL/hr over 20 Minutes Intravenous Every 4 hours 10/11/19 1133 10/12/19 1400   10/11/19 1200  vancomycin (VANCOREADY) IVPB 1500 mg/300 mL        1,500 mg 150 mL/hr over 120 Minutes Intravenous  Once 10/11/19 1135 10/11/19 1419   10/11/19 1145  vancomycin (VANCOCIN) IVPB 1000 mg/200 mL premix  Status:  Discontinued        1,000 mg 200 mL/hr over 60 Minutes Intravenous  Once 10/11/19 1133 10/11/19 1135      Subjective: Patient noted to be sitting up in the chair.  Denies any complaints.  No overnight issues per nursing staff.  Objective: Vitals:   11/19/19 0500 11/19/19 0757 11/19/19 0804 11/19/19 1008  BP:   (!) 150/68 137/78  Pulse:   81   Resp:   (!) 23   Temp:  98.4 F (36.9 C)    TempSrc:  Oral    SpO2:   98%   Weight: 75 kg      Height:        Intake/Output Summary (Last 24 hours) at 11/19/2019 1116 Last data filed at 11/19/2019 0500 Gross per 24 hour  Intake 630 ml  Output 775 ml  Net -145 ml   Filed Weights   11/11/19 0500 11/12/19 0500 11/19/19 0500  Weight: 71.6 kg 70.8 kg 75 kg    Examination:  General appearance: Awake alert.  In no distress Resp: Clear to auscultation bilaterally.  Normal effort Cardio: S1-S2 is normal regular.  No S3-S4.  No rubs murmurs or bruit GI: Abdomen is soft.  PEG tube is noted.  No bleeding noted around the site.  Abdomen is nontender.   Extremities: No edema.  Full range of motion of lower extremities. Neurologic: Mild right-sided deficit noted.     Data Reviewed: I have personally reviewed following labs and imaging studies  CBC: Recent Labs  Lab 11/14/19 1008 11/15/19 0150 11/16/19 0250 11/17/19 0347 11/18/19 0217  WBC 10.2 7.0 10.5 9.0 6.4  NEUTROABS 7.7  --   --   --   --   HGB 8.8* 7.8* 9.4* 9.1* 9.2*  HCT 27.7* 24.9* 29.1* 27.4* 28.1*  MCV 94.2 92.9 93.9 91.9 92.7  PLT 295 274 331 345 665   Basic Metabolic Panel: Recent Labs  Lab 11/13/19 0211 11/13/19 0211 11/14/19 1008 11/15/19 0150 11/16/19 0250 11/17/19 0347 11/18/19 0217  NA 141   < > 140 136 138 138 135  K 3.3*   < > 3.5 3.8 3.9 3.4* 3.5  CL 102   < > 106 103 105 103 101  CO2 26   < > '24 22 22 25 24  ' GLUCOSE 174*   < > 167* 89 58* 48* 205*  BUN 29*   < > 27* 27* 23* 20 18  CREATININE 1.47*   < > 1.38* 1.36* 1.26* 1.21 1.22  CALCIUM 8.7*   < > 8.6* 8.2* 8.4* 8.6* 8.6*  MG 1.9  --  1.9  --   --   --   --  PHOS 2.5  --  3.2  --   --   --   --    < > = values in this interval not displayed.   GFR: Estimated Creatinine Clearance: 69.2 mL/min (by C-G formula based on SCr of 1.22 mg/dL). Liver Function Tests: Recent Labs  Lab 11/14/19 1008 11/15/19 0150 11/16/19 0250 11/17/19 0347 11/18/19 0217  AST 52* 43* 35 27 27  ALT 48* 37 36 31 28  ALKPHOS 144* 129* 142* 147* 134*    BILITOT 0.4 0.2* 0.3 0.3 0.2*  PROT 6.9 6.2* 7.0 6.9 6.4*  ALBUMIN 2.8* 2.5* 2.8* 2.7* 2.6*   HbA1C: Recent Labs    11/17/19 0347  HGBA1C 6.3*   CBG: Recent Labs  Lab 11/18/19 1601 11/18/19 2010 11/18/19 2358 11/19/19 0417 11/19/19 0821  GLUCAP 173* 165* 164* 154* 160*     Radiology Studies: No results found.  Scheduled Meds: . amLODipine  10 mg Per Tube Daily  . aspirin  81 mg Per Tube Daily  . atorvastatin  80 mg Per Tube Daily  . bethanechol  15 mg Per Tube TID  . chlorhexidine gluconate (MEDLINE KIT)  15 mL Mouth Rinse BID  . Chlorhexidine Gluconate Cloth  6 each Topical Daily  . clopidogrel  75 mg Per Tube Daily  . doxazosin  1 mg Per Tube Daily  . famotidine  10 mg Per Tube Daily  . feeding supplement (PROSource TF)  45 mL Per Tube BID  . Gerhardt's butt cream   Topical Daily  . hydrALAZINE  50 mg Per Tube Q8H  . insulin aspart  0-9 Units Subcutaneous Q4H  . insulin aspart  2 Units Subcutaneous Q4H  . insulin glargine  6 Units Subcutaneous QHS  . lacosamide  150 mg Per Tube BID  . levETIRAcetam  1,000 mg Per Tube BID  . mouth rinse  15 mL Mouth Rinse q12n4p  . phenytoin  175 mg Per Tube TID  . QUEtiapine  50 mg Per Tube QHS  . scopolamine  1 patch Transdermal Q72H  . sennosides  5 mL Per Tube BID   Continuous Infusions: . sodium chloride Stopped (11/14/19 0053)  . feeding supplement (OSMOLITE 1.5 CAL) 1,000 mL (11/19/19 0740)     LOS: 35 days   Bonnielee Haff, MD [facility] Pager On Amion  If 7PM-7AM, please contact night-coverage 11/19/2019, 11:16 AM

## 2019-11-20 LAB — CBC
HCT: 27.7 % — ABNORMAL LOW (ref 39.0–52.0)
Hemoglobin: 8.9 g/dL — ABNORMAL LOW (ref 13.0–17.0)
MCH: 29.5 pg (ref 26.0–34.0)
MCHC: 32.1 g/dL (ref 30.0–36.0)
MCV: 91.7 fL (ref 80.0–100.0)
Platelets: 328 10*3/uL (ref 150–400)
RBC: 3.02 MIL/uL — ABNORMAL LOW (ref 4.22–5.81)
RDW: 13.8 % (ref 11.5–15.5)
WBC: 8.2 10*3/uL (ref 4.0–10.5)
nRBC: 0 % (ref 0.0–0.2)

## 2019-11-20 LAB — BASIC METABOLIC PANEL
Anion gap: 9 (ref 5–15)
BUN: 25 mg/dL — ABNORMAL HIGH (ref 6–20)
CO2: 27 mmol/L (ref 22–32)
Calcium: 8.7 mg/dL — ABNORMAL LOW (ref 8.9–10.3)
Chloride: 102 mmol/L (ref 98–111)
Creatinine, Ser: 1.12 mg/dL (ref 0.61–1.24)
GFR calc Af Amer: >60 mL/min (ref >60)
GFR calc non Af Amer: >60 mL/min (ref >60)
Glucose, Bld: 105 mg/dL — ABNORMAL HIGH (ref 70–99)
Potassium: 3.5 mmol/L (ref 3.5–5.1)
Sodium: 138 mmol/L (ref 135–145)

## 2019-11-20 LAB — GLUCOSE, CAPILLARY
Glucose-Capillary: 113 mg/dL — ABNORMAL HIGH (ref 70–99)
Glucose-Capillary: 154 mg/dL — ABNORMAL HIGH (ref 70–99)
Glucose-Capillary: 158 mg/dL — ABNORMAL HIGH (ref 70–99)
Glucose-Capillary: 169 mg/dL — ABNORMAL HIGH (ref 70–99)
Glucose-Capillary: 177 mg/dL — ABNORMAL HIGH (ref 70–99)
Glucose-Capillary: 197 mg/dL — ABNORMAL HIGH (ref 70–99)

## 2019-11-20 LAB — PHENYTOIN LEVEL, TOTAL: Phenytoin Lvl: 8.7 ug/mL — ABNORMAL LOW (ref 10.0–20.0)

## 2019-11-20 MED ORDER — POTASSIUM CHLORIDE 20 MEQ PO PACK
40.0000 meq | PACK | Freq: Once | ORAL | Status: DC
  Filled 2019-11-20: qty 2

## 2019-11-20 NOTE — H&P (Signed)
Physical Medicine and Rehabilitation Admission H&P    Chief Complaint  Patient presents with   Code Stroke  : HPI: Corey Bennett is a 59 year old right-handed male with history of diabetes mellitus, hypertension, hyperlipidemia, left MCA CVA 4/ 2021 while living in [place] underwent rehabilitation at [facility] with residual aphasia and right-sided weakness as well as left CEA 09/2019 who resides presently at assisted living facility[facility].  Presented 10/10/2019 with seizure-like activity requiring intubation.  CT/MRI showed large territory chronic hemorrhagic infarct in the left frontal lobe.  Ill-defined hyperintensities throughout the left basal ganglia and thalamus.  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  Patient remained on aspirin Plavix for CVA prophylaxis.    Keppra/Dilantin and Vimpat ordered for seizure disorder.  Patient did initially require intubation extubated 10/14/2019 however continued to have desaturations into the 80s 15 L nonrebreather mask requiring reintubation and tracheostomy performed 10/22/2019 presently with a #6 Shiley cuffless trach as of 11/02/2019.  Currently n.p.o. with gastrostomy tube placed 11/10/2019 per Dr. Loreta Ave of interventional radiology.  Patient bouts of urinary retention maintained on Urecholine.  Therapy evaluations completed and patient was admitted for a comprehensive rehab program. 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   Stroke [facility])    Past Surgical History:  Procedure Laterality Date   ENDARTERECTOMY Left 09/08/2019   Procedure: LEFT CAROTID ENDARTERECTOMY;  Surgeon: Chuck Hint, MD;  Location: [facility]  OR;  Service: Vascular;  Laterality: Left;   IR GASTROSTOMY TUBE MOD SED  11/10/2019   PATCH ANGIOPLASTY Left 09/08/2019   Procedure: PATCH ANGIOPLASTY USING XENOSURE BIOLOGIC PATCH 1x6cm;  Surgeon: Chuck Hint, MD;  Location[facility] OR;  Service: Vascular;  Laterality: Left;   TONSILLECTOMY     No family history on file. Social History:  reports that he has been smoking cigarettes. He has been smoking about 1.00 pack per day. He has never used smokeless tobacco. He reports current alcohol use. He reports that he does not use drugs. Allergies: No Known Allergies Medications Prior to Admission  Medication Sig Dispense Refill   amLODipine (NORVASC) 10 MG tablet Take 10 mg by mouth daily.      aspirin 81 MG chewable tablet Chew 81 mg by mouth daily.     chlorhexidine (PERIDEX) 0.12 % solution Use as directed 15 mLs in the mouth or throat 2 (two) times daily.     Cholecalciferol (VITAMIN D-3) 125 MCG (5000 UT) TABS Take 5,000 Units by mouth daily.      clopidogrel (PLAVIX) 75 MG tablet Take 1 tablet (75 mg total) by mouth daily. 30 tablet 11   HYDROcodone-acetaminophen (NORCO/VICODIN) 5-325 MG tablet Take 1 tablet by mouth every 6 (six) hours as needed for moderate pain.     insulin glargine (LANTUS) 100 UNIT/ML injection Inject 12 Units into the skin at bedtime.     insulin lispro (HUMALOG) 100 UNIT/ML injection Inject 5 Units into the skin 3 (three) times daily before meals. *HOLD INSULIN IF BLOOD GLUCOSE IS LESS THAN 120.*     losartan (COZAAR) 25 MG tablet Take 25 mg by mouth daily.      metFORMIN (GLUCOPHAGE) 1000 MG  tablet Take 1,000 mg by mouth 2 (two) times daily with a meal.      penicillin v potassium (VEETID) 500 MG tablet Take 500 mg by mouth 4 (four) times daily.     senna (SENOKOT) 8.6 MG TABS tablet Take 1 tablet by mouth daily.      Drug Regimen Review Drug regimen was reviewed and remains appropriate with no significant changes identified  Home: Home  Living Family/patient expects to be discharged to:: Unsure Type of Home: Assisted living Additional Comments: [facility].  Pt resided in [place] prior to stroke 06/2019   Functional History: Prior Function Level of Independence: Needs assistance Gait / Transfers Assistance Needed: walking without device at ALF ADL's / Homemaking Assistance Needed: Pt indicates he required some assistance with bathing, dressing, and that he was able to perform toileting mod I.  He also indicates he was able to self feed, and his meals were delivered to his room.  No family present to confirm accuracy of info.  Communication / Swallowing Assistance Needed: expressive and receptive aphasias at baseline per chart review  Functional Status:  Mobility: Bed Mobility Overal bed mobility: Needs Assistance Bed Mobility: Supine to Sit Rolling: Min assist Sidelying to sit: Mod assist Supine to sit: Supervision Sit to supine: Min guard General bed mobility comments: supervision for lines and safety; vc to complete task after stopping midway Transfers Overall transfer level: Needs assistance Equipment used: Rolling walker (2 wheeled) Transfer via Lift Equipment: Stedy Transfers: Sit to/from Stand Sit to Stand: Mod assist (due to posterior LOB) Stand pivot transfers: Mod assist, +2 physical assistance, +2 safety/equipment Squat pivot transfers: Mod assist General transfer comment: stood up quickly with posterior sway requiring up to mod assist to recover due (no righting reaction) Ambulation/Gait Ambulation/Gait assistance: Mod assist, Min assist Gait Distance (Feet): 90 Feet Assistive device: 2 person hand held assist (Pt placed left hand/arm on PTs shoulder; OT on RUE for WB'g ) Gait Pattern/deviations: Decreased dorsiflexion - right, Decreased step length - right, Drifts right/left, Step-through pattern, Decreased weight shift to right, Scissoring, Narrow base of support General Gait Details: pt able to  widen steps (step RLE more laterally) with verbal and visual cues (provided by SLP who arrived to see pt as ambulating); patient developed antalgic gait (favoring RLE) with rt knee maintained in Flexion  Gait velocity: reduced Gait velocity interpretation: <1.31 ft/sec, indicative of household ambulator    ADL: ADL Overall ADL's : Needs assistance/impaired Eating/Feeding: NPO Eating/Feeding Details (indicate cue type and reason): requires assist for thoroughness when washing face  Grooming: Maximal assistance, Standing (total +2 mod (A) with posterior lean) Grooming Details (indicate cue type and reason): sitting in bed with HOB elevated, washing face with setup assist but mod assist to wash hands  Upper Body Bathing: Moderate assistance, Sitting Lower Body Bathing: Moderate assistance, Sit to/from stand Upper Body Dressing : Moderate assistance, Sitting Lower Body Dressing: Moderate assistance, Sit to/from stand Lower Body Dressing Details (indicate cue type and reason): Pt able to don socks with min A to get sock started over toes and for balance  Toilet Transfer: +2 for physical assistance, Moderate assistance Toilet Transfer Details (indicate cue type and reason): simualted Toileting- Clothing Manipulation and Hygiene: Total assistance, Sit to/from stand Toileting - Clothing Manipulation Details (indicate cue type and reason): flexi seal leaking and pt was assisted with peri care in standing  Functional mobility during ADLs: Moderate assistance, +2 for physical assistance, +2 for safety/equipment General ADL Comments: pt reports "  no to alot of questions  Cognition: Cognition Overall Cognitive Status: Difficult to assess Arousal/Alertness: Awake/alert Orientation Level: Oriented to person Cognition Arousal/Alertness: Awake/alert Behavior During Therapy:  (Frustrated/annoyed by communication difficulties) Overall Cognitive Status: Difficult to assess Area of Impairment: Following  commands, Safety/judgement, Awareness, Problem solving Memory: Decreased short-term memory Following Commands: Follows one step commands consistently Safety/Judgement: Decreased awareness of safety, Decreased awareness of deficits Awareness: Intellectual Problem Solving: Requires verbal cues, Requires tactile cues General Comments: pt follows one step commands consistently, reduced awareness of deficits (especially posterior LOB) Difficult to assess due to: Tracheostomy  Physical Exam: Blood pressure (!) 153/64, pulse 84, temperature 98 F (36.7 C), temperature source Oral, resp. rate 17, height 6\' 1"  (1.854 m), weight 73 kg, SpO2 100 %. Physical Exam Neck:     Comments: Tracheostomy tube in place Abdominal:     Comments: Gastrostomy tube in place  Neurological:     Comments: Patient is alert.  Makes eye contact with examiner.  He was able to occlude his trach tube in state his name.  Follows simple verbal commands.     Results for orders placed or performed during the hospital encounter of 10/10/19 (from the past 48 hour(s))  Glucose, capillary     Status: Abnormal   Collection Time: 11/18/19 12:40 PM  Result Value Ref Range   Glucose-Capillary 170 (H) 70 - 99 mg/dL    Comment: Glucose reference range applies only to samples taken after fasting for at least 8 hours.   Comment 1 Notify RN    Comment 2 Document in Chart   Glucose, capillary     Status: Abnormal   Collection Time: 11/18/19  4:01 PM  Result Value Ref Range   Glucose-Capillary 173 (H) 70 - 99 mg/dL    Comment: Glucose reference range applies only to samples taken after fasting for at least 8 hours.   Comment 1 Notify RN    Comment 2 Document in Chart   Glucose, capillary     Status: Abnormal   Collection Time: 11/18/19  8:10 PM  Result Value Ref Range   Glucose-Capillary 165 (H) 70 - 99 mg/dL    Comment: Glucose reference range applies only to samples taken after fasting for at least 8 hours.  Glucose, capillary      Status: Abnormal   Collection Time: 11/18/19 11:58 PM  Result Value Ref Range   Glucose-Capillary 164 (H) 70 - 99 mg/dL    Comment: Glucose reference range applies only to samples taken after fasting for at least 8 hours.  Glucose, capillary     Status: Abnormal   Collection Time: 11/19/19  4:17 AM  Result Value Ref Range   Glucose-Capillary 154 (H) 70 - 99 mg/dL    Comment: Glucose reference range applies only to samples taken after fasting for at least 8 hours.  Glucose, capillary     Status: Abnormal   Collection Time: 11/19/19  8:21 AM  Result Value Ref Range   Glucose-Capillary 160 (H) 70 - 99 mg/dL    Comment: Glucose reference range applies only to samples taken after fasting for at least 8 hours.   Comment 1 Notify RN    Comment 2 Document in Chart   Glucose, capillary     Status: Abnormal   Collection Time: 11/19/19 12:05 PM  Result Value Ref Range   Glucose-Capillary 168 (H) 70 - 99 mg/dL    Comment: Glucose reference range applies only to samples taken after fasting for at least 8 hours.  Comment 1 Notify RN    Comment 2 Document in Chart   Glucose, capillary     Status: Abnormal   Collection Time: 11/19/19  3:57 PM  Result Value Ref Range   Glucose-Capillary 198 (H) 70 - 99 mg/dL    Comment: Glucose reference range applies only to samples taken after fasting for at least 8 hours.   Comment 1 Notify RN    Comment 2 Document in Chart   Glucose, capillary     Status: Abnormal   Collection Time: 11/19/19  7:18 PM  Result Value Ref Range   Glucose-Capillary 109 (H) 70 - 99 mg/dL    Comment: Glucose reference range applies only to samples taken after fasting for at least 8 hours.  Glucose, capillary     Status: Abnormal   Collection Time: 11/19/19 11:25 PM  Result Value Ref Range   Glucose-Capillary 159 (H) 70 - 99 mg/dL    Comment: Glucose reference range applies only to samples taken after fasting for at least 8 hours.   Comment 1 Notify RN    Comment 2 Document  in Chart   CBC     Status: Abnormal   Collection Time: 11/20/19  1:37 AM  Result Value Ref Range   WBC 8.2 4.0 - 10.5 K/uL   RBC 3.02 (L) 4.22 - 5.81 MIL/uL   Hemoglobin 8.9 (L) 13.0 - 17.0 g/dL   HCT 38.2 (L) 39 - 52 %   MCV 91.7 80.0 - 100.0 fL   MCH 29.5 26.0 - 34.0 pg   MCHC 32.1 30.0 - 36.0 g/dL   RDW 50.5 39.7 - 67.3 %   Platelets 328 150 - 400 K/uL   nRBC 0.0 0.0 - 0.2 %    Comment: Performed at [facility], [address], [place], [place] [address]  Basic metabolic panel     Status: Abnormal   Collection Time: 11/20/19  1:37 AM  Result Value Ref Range   Sodium 138 135 - 145 mmol/L   Potassium 3.5 3.5 - 5.1 mmol/L   Chloride 102 98 - 111 mmol/L   CO2 27 22 - 32 mmol/L   Glucose, Bld 105 (H) 70 - 99 mg/dL    Comment: Glucose reference range applies only to samples taken after fasting for at least 8 hours.   BUN 25 (H) 6 - 20 mg/dL   Creatinine, Ser 9.02 0.61 - 1.24 mg/dL   Calcium 8.7 (L) 8.9 - 10.3 mg/dL   GFR calc non Af Amer >60 >60 mL/min   GFR calc Af Amer >60 >60 mL/min   Anion gap 9 5 - 15    Comment: Performed at [facility], [address], [place], [place] [address]  Phenytoin level, total     Status: Abnormal   Collection Time: 11/20/19  1:37 AM  Result Value Ref Range   Phenytoin Lvl 8.7 (L) 10.0 - 20.0 ug/mL    Comment: Performed at [facility], [address] [place], [place] [address]  Glucose, capillary     Status: Abnormal   Collection Time: 11/20/19  3:53 AM  Result Value Ref Range   Glucose-Capillary 154 (H) 70 - 99 mg/dL    Comment: Glucose reference range applies only to samples taken after fasting for at least 8 hours.  Glucose, capillary     Status: Abnormal   Collection Time: 11/20/19  8:15 AM  Result Value Ref Range   Glucose-Capillary 113 (H) 70 - 99 mg/dL  Comment: Glucose reference range applies only to samples taken after fasting for at least 8 hours.   Comment 1 Notify RN    Comment 2 Document in Chart    No  results found.     Medical Problem List and Plan: 1.  Right hemiparesis with severe aphasia/dysphagia secondary to left MCA distribution infarct as well as history of left MCA infarct  -patient may  shower  -ELOS/Goals:  2.  Antithrombotics: -DVT/anticoagulation: SCDs  -antiplatelet therapy: Aspirin 81 mg daily and Plavix 75 mg daily 3. Pain Management: Tylenol as needed 4. Mood: Provide emotional support  -antipsychotic agents: Seroquel 50 mg nightly 5. Neuropsych: This patient is not capable of making decisions on his own behalf. 6. Skin/Wound Care: Routine skin checks 7. Fluids/Electrolytes/Nutrition: Routine in and outs with follow-up chemistries 8.  Seizure disorder.  Dilantin 175 mg 3 times daily, Keppra 1000 mg twice daily, Vimpat 150 mg twice daily.  Follow-up neurology services 9.  Diabetes mellitus.  Hemoglobin A1c 7.2.  Lantus insulin 6 units nightly, NovoLog 2 units every 4 hours. 10.  Dysphagia.  NPO.  Status post gastrostomy tube 11/10/2019 per interventional radiology.  Follow-up speech therapy 11.  Hypertension.  Hydralazine 50 mg every 8 hours, Cardura 1 mg daily, Norvasc 10 mg daily.  Monitor with increased mobility 12.  Acute on chronic respiratory failure.  Status post tracheostomy tube.  Presently with a #6 Shiley flexible cuffless trach as of 11/02/2019 followed by pulmonary services Dr. Levon Hedger 13.  Hyperlipidemia.  Lipitor 14.  Urinary retention.  Urecholine 50 mg 3 times daily.  Check PVR    Charlton Amor, PA-C 11/20/2019

## 2019-11-20 NOTE — Progress Notes (Signed)
MEDICATION RELATED CONSULT NOTE - FOLLOW UP   Pharmacy Consult for Phenytoin Indication: Seizures   No Known Allergies  Patient Measurements: Height: 6\' 1"  (185.4 cm) Weight: 73 kg (160 lb 15 oz) IBW/kg (Calculated) : 79.9  Vital Signs: Temp: 98 F (36.7 C) (09/17 0805) Temp Source: Oral (09/17 0805) BP: 176/79 (09/17 0815) Pulse Rate: 84 (09/17 0815) Intake/Output from previous day: 09/16 0701 - 09/17 0700 In: 190  Out: 1150 [Urine:1150] Intake/Output from this shift: No intake/output data recorded.  Labs: Recent Labs    11/18/19 0217 11/20/19 0137  WBC 6.4 8.2  HGB 9.2* 8.9*  HCT 28.1* 27.7*  PLT 324 328  CREATININE 1.22 1.12  ALBUMIN 2.6*  --   PROT 6.4*  --   AST 27  --   ALT 28  --   ALKPHOS 134*  --   BILITOT 0.2*  --    Estimated Creatinine Clearance: 73.3 mL/min (by C-G formula based on SCr of 1.12 mg/dL).    Assessment: 59 yo M admitted with seizures. Last seizure noted 10/17/19.  9/17 DPH level = 8.7, corrected 10.7 (alb 2.6) on dilantin suspension 175 mg per tube every 8 hours.   Goal phenytoin level 15-20 mcg/ml per MD's pharmacy consult order.  No recent seizure activity and current dilantin level of 10.17mcg/ml  is within the therapeutic range for dilantin.  Will continue dilantin at current dose.   Levels and dilantin dose adjustments this admission: - 8/7 loaded with DPH (goal 15-20) and Keppra - 8/9 Keppra dose reduced d/t renal fxn 750mg  IV q12h > 8/13 increased back to 1000mg  q12h  8/7 DPH 1500mg  IV x1 then 100mg  Q8H (~4 mg/kg/d based on TBW; IBW 80 kg) 8/8 DPH level (post-load): 20.1 >> no adjustments, cont current dosing 8/11 DPH 14.1, albumin 4.1 8/12 DPH 13.4.  Vimpat and Onfi added. 8/13 DPH 12.6, Albumin 2.5, corrected 21. Vimpat and Keppra increased d/t improved CrCl 8/15 DPH 7.5 > corrected = 12.5, free level ~0.95 >> 150mg  x1 then resume 100 Q8hr, renal fx worsening 8/17 DPH 4.3 (Corrects to 9.35 based on albumin from 8/18) -  continue current dose per MD  8/18: Phenytoin level 3 adjusts to 8.4 8/24: Switched from IV to PO  8/30: Phenytoin level 3.5 adjusted to 6 for albumin and renal function (goal 15-20)  9/4: Phenytoin level 3.8 adjusted to 5.9 for albumin and renal function  --Phenytoin level dropped significantly after switching to per tube administration due to binding by continuous tube feeds. Would be difficult to hold tube feeds 2hr before and after each dose. Could switch to nocturnal feeds but would have to increase TF rate significantly to 12ml/hr to get the same nutrition.  --9/4 update: Discussed with MD to increase dose vs switch back to IV.  Per MD, increase dose and recheck levels. Dose increased, no reported seizures.  Will follow levels  9/10 DPH 6.3>>corrected 9.5 alb 2.8. Error when convert to IV. small bolus (150 mg IV) and recheck level in a few days.   > got 100 -150-100 IV for about 2 days 9/11 DPH 175 PT q8h since 10pm on 9/10  9/17 DPH level = 8.7, corrected 10.7 (alb 2.6)  No recent seizure activity.   Goal of Therapy:  Phenytoin level 15-20 Seizure control  Plan:  Continue Phenytoin at current dose 175 mg per tube every 8 hours (~7mg /kg/day based on protocol )    11/4, RPh Clinical Pharmacist [place] Please utilize Amion for appropriate phone  number to reach the unit pharmacist [facility]) 11/20/2019 8:49 AM

## 2019-11-20 NOTE — Progress Notes (Signed)
Inpatient Rehab Admissions Coordinator:    I have received insurance authorization for CIR admission and have a bed available for Pt. Tomorrow. Rehab  MD will see Pt. In the morning to give final approval for admission. Acute RN may may call Rehab Charge RN at [phone] for report.   Megan Salon, MS, CCC-SLP Rehab Admissions Coordinator  [phone] (celll) [phone] (office)

## 2019-11-20 NOTE — Progress Notes (Signed)
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 Patient is living in a assisted living facility and can ambulate by himself. Due to concerns of seizure he was intubated for airway protection and taken to the ICU. Eventually had tracheostomy placed on 8/23. Since then he has had multiple swallowing evaluation which he continues to fail and progressing only very slowly. He's now s/p PEG on 9/7.  Plan for CIR at this time.  Assessment & Plan:   Principal Problem:   Status epilepticus ([place]) Active Problems:   Carotid artery stenosis   Seizure (HCC)   CVA (cerebrovascular accident due to intracerebral hemorrhage) (HCC)/ left frontal lobe   AKI (acute kidney injury) ([place])   Essential hypertension   Type 2 diabetes mellitus with hyperlipidemia (HCC)   Acute on chronic chronic respiratory failure with hypoxemia requiring tracheostomy tube placement and mechanical ventilatory support. -Currently has tracheostomy in place. Continue supportive care. Incentive spirometry, bronchodilators as needed. -Continue with routine oral/tracheostomy care.  It appears that patient was changed over to a #6 Shiley flexible cuffless trach on 8/30. Patient remains stable from a respiratory standpoint. Further management of tracheostomy per pulmonology.  Nausea  Vomiting: Resolved. Recent CT abdomen pelvis without acute intraabdominal abnormalities. Continue with zofran prn  S/p PEG Placement:  -Pain surrounding PEG improved with analgesia -On 9/14 patient noted to have bleeding around the PEG site.  Apparently he had some clots.  Nursing reported that patient had been manipulating the tube.  Interventional radiology evaluated patient.   Tube was thought to be in the good position and okay to use.  A binder was placed to protect the tube. Stable. No bleeding noted around the PEG tube site.  Status epilepticus in the setting of recent large left MCA stroke -Continue Vimpat, Keppra and Dilantin. No recent seizure activity.  Fever, improvedlikely in the setting of pneumonia with concern for aspiration, resolved CXR 9/10 with atelectasis/infitrate at R base (where endobronchial debris by recent CT). CT abd/pelvis from 9/9 with atelectasis and/or consolidation of R lower lobe with bronchitic changes. WBC is normal.  Patient remains afebrile.  Essential hypertension Continue amlodipine and hydralazine. Occasional high blood pressure readings noted.   Urinary Retention Continue doxazosin and bethanechol.  Failed voiding trial 9/9 - 9/10 -> continue foley. Voiding trial can be done at inpatient rehabilitation.  AKI, resolved Seems to be improved.  Monitor urine output.  Could be related to urinary retention.  Diabetes type 2 with Hyperglycemia HbA1c 6.3. CBGs are reasonably well controlled. Continue Lantus and SSI.   Dysphagia. S/p peg placement.  Speech therapy continues to work with the patient.  Remains NPO.  Spoons of nectar thick liquids being given at times.  Normocytic Anemia Stable.  No overt bleeding except for bleeding around the PEG tube noticed a few days ago which has since resolved. Hemoglobin stable for the most part.  Hx of Left MCA stroke with residual aphasia and right hemiparesis  -Continue with Aspirin Plavix, Lipitor 80 mg daily as tolerated  Right arm swelling  -Likely from superficial thrombosis, recommend warm compresses  Abnormal LFT's -In the setting of acute illness.  LFTs were normal when last checked.    Nutrition Continue tube feedings.  DVT prophylaxis: SCD's Code Status: Full Family Communication:  No family at bedside Disposition: Waiting on bed availability at  CIR  Status is: Inpatient  Remains inpatient appropriate because:Unsafe d/c plan   Dispo: The patient is from: Home              Anticipated d/c is to: CIR              Anticipated d/c date is: September 18              Patient currently is medically stable to d/c.  Consultants:   PCCM  Neurology  Procedures:  PEG 9/7 ETT Trach 8/19  Antimicrobials: Anti-infectives (From admission, onward)   Start     Dose/Rate Route Frequency Ordered Stop   11/10/19 1815  ceFAZolin (ANCEF) IVPB 2g/100 mL premix        2 g 200 mL/hr over 30 Minutes Intravenous On call 11/10/19 1808 11/10/19 2104   11/10/19 1551  ceFAZolin (ANCEF) 2-4 GM/100ML-% IVPB       Note to Pharmacy: Yolanda Manges   : cabinet override      11/10/19 1551 11/10/19 1555   11/10/19 0000  ceFAZolin (ANCEF) IVPB 2g/100 mL premix        2 g 200 mL/hr over 30 Minutes Intravenous Once 11/09/19 1220 11/10/19 0027   10/30/19 1400  cefTRIAXone (ROCEPHIN) 2 g in sodium chloride 0.9 % 100 mL IVPB  Status:  Discontinued        2 g 200 mL/hr over 30 Minutes Intravenous Every 24 hours 10/30/19 1251 11/04/19 1358   10/18/19 2300  vancomycin (VANCOREADY) IVPB 750 mg/150 mL  Status:  Discontinued        750 mg 150 mL/hr over 60 Minutes Intravenous Every 24 hours 10/18/19 1359 10/19/19 1217   10/18/19 0200  vancomycin (VANCOREADY) IVPB 500 mg/100 mL  Status:  Discontinued        500 mg 100 mL/hr over 60 Minutes Intravenous Every 12 hours 10/17/19 1242 10/18/19 1359   10/17/19 1245  vancomycin (VANCOCIN) IVPB 1000 mg/200 mL premix        1,000 mg 200 mL/hr over 60 Minutes Intravenous  Once 10/17/19 1242 10/17/19 1441   10/17/19 1245  piperacillin-tazobactam (ZOSYN) IVPB 3.375 g        3.375 g 12.5 mL/hr over 240 Minutes Intravenous Every 8 hours 10/17/19 1242 10/24/19 2359   10/17/19 0900  ampicillin-sulbactam (UNASYN) 1.5 g in sodium chloride 0.9 % 100 mL IVPB  Status:  Discontinued        1.5 g 200 mL/hr over 30 Minutes  Intravenous Every 6 hours 10/17/19 0809 10/17/19 0850   10/17/19 0900  Ampicillin-Sulbactam (UNASYN) 3 g in sodium chloride 0.9 % 100 mL IVPB  Status:  Discontinued        3 g 200 mL/hr over 30 Minutes Intravenous Every 6 hours 10/17/19 0850 10/17/19 1242   10/14/19 0600  vancomycin (VANCOREADY) IVPB 500 mg/100 mL  Status:  Discontinued        500 mg 100 mL/hr over 60 Minutes Intravenous Every 12 hours 10/14/19 0057 10/16/19 1534   10/12/19 1600  ampicillin (OMNIPEN) 2 g in sodium chloride 0.9 % 100 mL IVPB  Status:  Discontinued        2 g 300 mL/hr over 20 Minutes Intravenous Every 6 hours 10/12/19 1400 10/14/19 0934   10/12/19 0000  vancomycin (VANCOREADY) IVPB 750 mg/150 mL  Status:  Discontinued        750 mg 150 mL/hr over 60 Minutes Intravenous Every 12  hours 10/11/19 1254 10/14/19 0046   10/11/19 1230  cefTRIAXone (ROCEPHIN) 2 g in sodium chloride 0.9 % 100 mL IVPB  Status:  Discontinued        2 g 200 mL/hr over 30 Minutes Intravenous Every 12 hours 10/11/19 1133 10/16/19 1534   10/11/19 1230  ampicillin (OMNIPEN) 2 g in sodium chloride 0.9 % 100 mL IVPB  Status:  Discontinued        2 g 300 mL/hr over 20 Minutes Intravenous Every 4 hours 10/11/19 1133 10/12/19 1400   10/11/19 1200  vancomycin (VANCOREADY) IVPB 1500 mg/300 mL        1,500 mg 150 mL/hr over 120 Minutes Intravenous  Once 10/11/19 1135 10/11/19 1419   10/11/19 1145  vancomycin (VANCOCIN) IVPB 1000 mg/200 mL premix  Status:  Discontinued        1,000 mg 200 mL/hr over 60 Minutes Intravenous  Once 10/11/19 1133 10/11/19 1135      Subjective: Patient noted to be lying comfortably on the bed. He denies any complaints.  Objective: Vitals:   11/20/19 0400 11/20/19 0805 11/20/19 0815 11/20/19 0921  BP:  (!) 176/79 (!) 176/79 (!) 153/64  Pulse:  80 84   Resp:  11 17   Temp:  98 F (36.7 C)    TempSrc:  Oral    SpO2:  99% 100%   Weight: 73 kg     Height:        Intake/Output Summary (Last 24 hours) at  11/20/2019 1105 Last data filed at 11/20/2019 0507 Gross per 24 hour  Intake 190 ml  Output 1150 ml  Net -960 ml   Filed Weights   11/12/19 0500 11/19/19 0500 11/20/19 0400  Weight: 70.8 kg 75 kg 73 kg    Examination:  General appearance: Awake alert.  In no distress Tracheostomy noted Resp: Normal effort. Coarse breath sounds bilaterally. Few crackles at the bases. No wheezing or rhonchi. Cardio: S1-S2 is normal regular.  No S3-S4.  No rubs murmurs or bruit GI: Abdomen is soft.  Nontender nondistended.  Bowel sounds are present normal.  No masses organomegaly Extremities: No edema.  Neurologic: Awake alert. Mild right-sided deficits noted.    Data Reviewed: I have personally reviewed following labs and imaging studies  CBC: Recent Labs  Lab 11/14/19 1008 11/14/19 1008 11/15/19 0150 11/16/19 0250 11/17/19 0347 11/18/19 0217 11/20/19 0137  WBC 10.2   < > 7.0 10.5 9.0 6.4 8.2  NEUTROABS 7.7  --   --   --   --   --   --   HGB 8.8*   < > 7.8* 9.4* 9.1* 9.2* 8.9*  HCT 27.7*   < > 24.9* 29.1* 27.4* 28.1* 27.7*  MCV 94.2   < > 92.9 93.9 91.9 92.7 91.7  PLT 295   < > 274 331 345 324 328   < > = values in this interval not displayed.   Basic Metabolic Panel: Recent Labs  Lab 11/14/19 1008 11/14/19 1008 11/15/19 0150 11/16/19 0250 11/17/19 0347 11/18/19 0217 11/20/19 0137  NA 140   < > 136 138 138 135 138  K 3.5   < > 3.8 3.9 3.4* 3.5 3.5  CL 106   < > 103 105 103 101 102  CO2 24   < > _0 GLUCOSE 167*   < > 89 58* 48* 205* 105*  BUN 27*   < > 27* 23* 20 18 25*  CREATININE 1.38*   < >  1.36* 1.26* 1.21 1.22 1.12  CALCIUM 8.6*   < > 8.2* 8.4* 8.6* 8.6* 8.7*  MG 1.9  --   --   --   --   --   --   PHOS 3.2  --   --   --   --   --   --    < > = values in this interval not displayed.   GFR: Estimated Creatinine Clearance: 73.3 mL/min (by C-G formula based on SCr of 1.12 mg/dL). Liver Function Tests: Recent Labs  Lab 11/14/19 1008 11/15/19 0150  11/16/19 0250 11/17/19 0347 11/18/19 0217  AST 52* 43* 35 27 27  ALT 48* 37 36 31 28  ALKPHOS 144* 129* 142* 147* 134*  BILITOT 0.4 0.2* 0.3 0.3 0.2*  PROT 6.9 6.2* 7.0 6.9 6.4*  ALBUMIN 2.8* 2.5* 2.8* 2.7* 2.6*   CBG: Recent Labs  Lab 11/19/19 1557 11/19/19 1918 11/19/19 2325 11/20/19 0353 11/20/19 0815  GLUCAP 198* 109* 159* 154* 113*     Radiology Studies: No results found.  Scheduled Meds: . amLODipine  10 mg Per Tube Daily  . aspirin  81 mg Per Tube Daily  . atorvastatin  80 mg Per Tube Daily  . bethanechol  15 mg Per Tube TID  . chlorhexidine gluconate (MEDLINE KIT)  15 mL Mouth Rinse BID  . Chlorhexidine Gluconate Cloth  6 each Topical Daily  . clopidogrel  75 mg Per Tube Daily  . doxazosin  1 mg Per Tube Daily  . famotidine  10 mg Per Tube Daily  . feeding supplement (PROSource TF)  45 mL Per Tube BID  . Gerhardt's butt cream   Topical Daily  . hydrALAZINE  50 mg Per Tube Q8H  . insulin aspart  0-9 Units Subcutaneous Q4H  . insulin aspart  2 Units Subcutaneous Q4H  . insulin glargine  6 Units Subcutaneous QHS  . lacosamide  150 mg Per Tube BID  . levETIRAcetam  1,000 mg Per Tube BID  . mouth rinse  15 mL Mouth Rinse q12n4p  . phenytoin  175 mg Per Tube TID  . potassium chloride  40 mEq Per Tube Once  . QUEtiapine  50 mg Per Tube QHS  . scopolamine  1 patch Transdermal Q72H  . sennosides  5 mL Per Tube BID   Continuous Infusions: . sodium chloride Stopped (11/14/19 0053)  . feeding supplement (OSMOLITE 1.5 CAL) 1,000 mL (11/20/19 0507)     LOS: 40 days   Bonnielee Haff, MD [facility] Pager On Amion  If 7PM-7AM, please contact night-coverage 11/20/2019, 11:05 AM

## 2019-11-20 NOTE — Progress Notes (Signed)
Physical Therapy Treatment Patient Details Name: Corey Bennett MRN: [id number] DOB: 1960-12-08 Today's Date: 11/20/2019    History of Present Illness 59 y.o. 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  He was intubated for airway protection on 10/10/19. Pt extubated on 10/14/19.  on 8/14, pt had continued twitching of Rt UE with AMS, and decreased 02 sats.  He was intubated.  Diagnosis: status epilepticus in setting of prior large Lt MCA infarct; acute kidney injury, possible acute meningitis.  He underwent tracheostomy on 8/19/2.  PMH includes: poorly controlled DM, dysphagia, HTN.  PEG  11/10/19.    PT Comments    Pt agreeable, almost eager to get OOB and up on his feet.  Somewhat impulsive with standing, before therapist was ready.  Emphasis on standing balance and progressing gait stability and pattern.    Follow Up Recommendations  CIR     Equipment Recommendations  Other (comment) (TBA)    Recommendations for Other Services       Precautions / Restrictions Precautions Precautions: Fall Precaution Comments: trach, PEG (abdominal binder)    Mobility  Bed Mobility Overal bed mobility: Needs Assistance Bed Mobility: Supine to Sit     Supine to sit: Min guard;HOB elevated        Transfers Overall transfer level: Needs assistance   Transfers: Sit to/from Stand Sit to Stand: Mod assist Stand pivot transfers: Mod assist       General transfer comment: pt uses bed frame for balance, pulls up instead of pushing as cued  Ambulation/Gait Ambulation/Gait assistance: Mod assist Gait Distance (Feet): 100 Feet Assistive device: IV Pole;1 person hand held assist Gait Pattern/deviations: Step-to pattern Gait velocity: reduced Gait velocity interpretation: <1.31 ft/sec, indicative of household ambulator General Gait Details: paretic gait on the right with mild degradation  with fatigue.  working on toe off and contact in a more widening stance.  Generally places foot almost in tandem unless cued or facilitated otherwise.   Stairs             Wheelchair Mobility    Modified Rankin (Stroke Patients Only)       Balance Overall balance assessment: Needs assistance   Sitting balance-Leahy Scale: Good     Standing balance support: Single extremity supported Standing balance-Leahy Scale: Poor Standing balance comment: either UE support or LE's against bed frame.                            Cognition Arousal/Alertness: Awake/alert Behavior During Therapy: Flat affect Overall Cognitive Status: Difficult to assess                         Following Commands: Follows one step commands consistently   Awareness: Intellectual Problem Solving: Requires verbal cues;Requires tactile cues General Comments: pt follows one step commands consistently, reduced awareness of deficits (especially posterior LOB)      Exercises      General Comments General comments (skin integrity, edema, etc.): vss      Pertinent Vitals/Pain Pain Assessment: Faces Faces Pain Scale: Hurts little more Pain Location: ? RLE Pain Descriptors / Indicators: Grimacing;Guarding Pain Intervention(s): Monitored during session    Home Living                      Prior Function  PT Goals (current goals can now be found in the care plan section) Acute Rehab PT Goals PT Goal Formulation: With patient Time For Goal Achievement: 11/25/19 Potential to Achieve Goals: Fair Progress towards PT goals: Progressing toward goals    Frequency    Min 3X/week      PT Plan Current plan remains appropriate    Co-evaluation              AM-PAC PT "6 Clicks" Mobility   Outcome Measure  Help needed turning from your back to your side while in a flat bed without using bedrails?: A Little Help needed moving from lying on your back to  sitting on the side of a flat bed without using bedrails?: A Little Help needed moving to and from a bed to a chair (including a wheelchair)?: A Lot Help needed standing up from a chair using your arms (e.g., wheelchair or bedside chair)?: A Lot Help needed to walk in hospital room?: A Lot Help needed climbing 3-5 steps with a railing? : Total 6 Click Score: 13    End of Session   Activity Tolerance: Patient limited by pain;Patient tolerated treatment well Patient left: in chair;with call bell/phone within reach;with chair alarm set Nurse Communication: Mobility status PT Visit Diagnosis: Other abnormalities of gait and mobility (R26.89);Other symptoms and signs involving the nervous system (R29.898);Muscle weakness (generalized) (M62.81);Unsteadiness on feet (R26.81);Difficulty in walking, not elsewhere classified (R26.2) Hemiplegia - Right/Left: Right Hemiplegia - caused by: Cerebral infarction     Time: 0630-1601 PT Time Calculation (min) (ACUTE ONLY): 20 min  Charges:  $Gait Training: 8-22 mins                     11/20/2019  Jacinto Halim., PT Acute Rehabilitation Services [phone]  (pager) [phone]  (office)   Eliseo Gum Darci Lykins 11/20/2019, 5:17 PM

## 2019-11-20 NOTE — Progress Notes (Signed)
RT asked secretary to order box of inner cannulas for pt.

## 2019-11-21 ENCOUNTER — Inpatient Hospital Stay (HOSPITAL_COMMUNITY)
Admission: RE | Admit: 2019-11-21 | Discharge: 2019-12-15 | DRG: 057 | Disposition: A | Discharge status: Rehabilitation Facility | Payer: BC Managed Care – PPO | Source: Intra-hospital | Attending: Physical Medicine & Rehabilitation | Admitting: Physical Medicine & Rehabilitation

## 2019-11-21 ENCOUNTER — Other Ambulatory Visit: Payer: Self-pay

## 2019-11-21 ENCOUNTER — Encounter (HOSPITAL_COMMUNITY): Payer: Self-pay | Admitting: Physical Medicine & Rehabilitation

## 2019-11-21 DIAGNOSIS — I69391 Dysphagia following cerebral infarction: Secondary | ICD-10-CM | POA: Diagnosis not present

## 2019-11-21 DIAGNOSIS — Z1621 Resistance to vancomycin: Secondary | ICD-10-CM | POA: Diagnosis present

## 2019-11-21 DIAGNOSIS — E785 Hyperlipidemia, unspecified: Secondary | ICD-10-CM | POA: Diagnosis present

## 2019-11-21 DIAGNOSIS — Z79899 Other long term (current) drug therapy: Secondary | ICD-10-CM | POA: Diagnosis not present

## 2019-11-21 DIAGNOSIS — T17908A Unspecified foreign body in respiratory tract, part unspecified causing other injury, initial encounter: Secondary | ICD-10-CM

## 2019-11-21 DIAGNOSIS — I69320 Aphasia following cerebral infarction: Secondary | ICD-10-CM

## 2019-11-21 DIAGNOSIS — E1165 Type 2 diabetes mellitus with hyperglycemia: Secondary | ICD-10-CM | POA: Diagnosis present

## 2019-11-21 DIAGNOSIS — Z930 Tracheostomy status: Secondary | ICD-10-CM | POA: Diagnosis not present

## 2019-11-21 DIAGNOSIS — Z931 Gastrostomy status: Secondary | ICD-10-CM | POA: Diagnosis not present

## 2019-11-21 DIAGNOSIS — R1310 Dysphagia, unspecified: Secondary | ICD-10-CM | POA: Diagnosis present

## 2019-11-21 DIAGNOSIS — Z7902 Long term (current) use of antithrombotics/antiplatelets: Secondary | ICD-10-CM | POA: Diagnosis not present

## 2019-11-21 DIAGNOSIS — G40901 Epilepsy, unspecified, not intractable, with status epilepticus: Secondary | ICD-10-CM | POA: Diagnosis present

## 2019-11-21 DIAGNOSIS — F17210 Nicotine dependence, cigarettes, uncomplicated: Secondary | ICD-10-CM | POA: Diagnosis present

## 2019-11-21 DIAGNOSIS — Z681 Body mass index (BMI) 19 or less, adult: Secondary | ICD-10-CM

## 2019-11-21 DIAGNOSIS — E119 Type 2 diabetes mellitus without complications: Secondary | ICD-10-CM | POA: Diagnosis present

## 2019-11-21 DIAGNOSIS — R110 Nausea: Secondary | ICD-10-CM

## 2019-11-21 DIAGNOSIS — Z7982 Long term (current) use of aspirin: Secondary | ICD-10-CM | POA: Diagnosis not present

## 2019-11-21 DIAGNOSIS — I69351 Hemiplegia and hemiparesis following cerebral infarction affecting right dominant side: Secondary | ICD-10-CM | POA: Diagnosis present

## 2019-11-21 DIAGNOSIS — Z794 Long term (current) use of insulin: Secondary | ICD-10-CM

## 2019-11-21 DIAGNOSIS — I10 Essential (primary) hypertension: Secondary | ICD-10-CM | POA: Diagnosis present

## 2019-11-21 DIAGNOSIS — B952 Enterococcus as the cause of diseases classified elsewhere: Secondary | ICD-10-CM | POA: Diagnosis present

## 2019-11-21 DIAGNOSIS — J9611 Chronic respiratory failure with hypoxia: Secondary | ICD-10-CM | POA: Diagnosis present

## 2019-11-21 DIAGNOSIS — G40909 Epilepsy, unspecified, not intractable, without status epilepticus: Secondary | ICD-10-CM | POA: Diagnosis present

## 2019-11-21 DIAGNOSIS — R339 Retention of urine, unspecified: Secondary | ICD-10-CM | POA: Diagnosis present

## 2019-11-21 DIAGNOSIS — R1110 Vomiting, unspecified: Secondary | ICD-10-CM

## 2019-11-21 DIAGNOSIS — N390 Urinary tract infection, site not specified: Secondary | ICD-10-CM | POA: Diagnosis present

## 2019-11-21 DIAGNOSIS — E7800 Pure hypercholesterolemia, unspecified: Secondary | ICD-10-CM | POA: Diagnosis present

## 2019-11-21 DIAGNOSIS — I63512 Cerebral infarction due to unspecified occlusion or stenosis of left middle cerebral artery: Secondary | ICD-10-CM | POA: Diagnosis not present

## 2019-11-21 DIAGNOSIS — E440 Moderate protein-calorie malnutrition: Secondary | ICD-10-CM | POA: Diagnosis present

## 2019-11-21 DIAGNOSIS — I611 Nontraumatic intracerebral hemorrhage in hemisphere, cortical: Secondary | ICD-10-CM | POA: Diagnosis not present

## 2019-11-21 DIAGNOSIS — R0603 Acute respiratory distress: Secondary | ICD-10-CM

## 2019-11-21 DIAGNOSIS — K5900 Constipation, unspecified: Secondary | ICD-10-CM | POA: Diagnosis present

## 2019-11-21 LAB — GLUCOSE, CAPILLARY
Glucose-Capillary: 129 mg/dL — ABNORMAL HIGH (ref 70–99)
Glucose-Capillary: 199 mg/dL — ABNORMAL HIGH (ref 70–99)
Glucose-Capillary: 152 mg/dL — ABNORMAL HIGH (ref 70–99)
Glucose-Capillary: 192 mg/dL — ABNORMAL HIGH (ref 70–99)
Glucose-Capillary: 137 mg/dL — ABNORMAL HIGH (ref 70–99)
Glucose-Capillary: 133 mg/dL — ABNORMAL HIGH (ref 70–99)

## 2019-11-21 MED ORDER — GERHARDT'S BUTT CREAM
TOPICAL_CREAM | Freq: Every day | CUTANEOUS | Status: DC
  Administered 2019-11-24: 1 via TOPICAL
  Filled 2019-11-21: qty 1

## 2019-11-21 MED ORDER — PHENYTOIN 125 MG/5ML PO SUSP
175.0000 mg | Freq: Three times a day (TID) | ORAL | Status: DC
  Administered 2019-11-21 – 2019-11-28 (×21): 175 mg
  Filled 2019-11-21 (×22): qty 8

## 2019-11-21 MED ORDER — AMLODIPINE BESYLATE 10 MG PO TABS: 10.0000 mg | ORAL_TABLET | Freq: Every day | ORAL | Status: DC

## 2019-11-21 MED ORDER — SCOPOLAMINE 1 MG/3DAYS TD PT72
1.0000 | MEDICATED_PATCH | TRANSDERMAL | Status: DC
  Administered 2019-11-21 – 2019-12-12 (×8): 1.5 mg via TRANSDERMAL
  Filled 2019-11-21 (×8): qty 1

## 2019-11-21 MED ORDER — ACETAMINOPHEN 160 MG/5ML PO SOLN
650.0000 mg | Freq: Four times a day (QID) | ORAL | Status: DC | PRN
  Administered 2019-11-23 – 2019-12-15 (×21): 650 mg
  Filled 2019-11-21 (×21): qty 20.3

## 2019-11-21 MED ORDER — QUETIAPINE FUMARATE 50 MG PO TABS
50.0000 mg | ORAL_TABLET | Freq: Every day | ORAL | Status: DC
  Administered 2019-11-21 – 2019-12-14 (×24): 50 mg
  Filled 2019-11-21 (×24): qty 1

## 2019-11-21 MED ORDER — INSULIN GLARGINE 100 UNIT/ML ~~LOC~~ SOLN
6.0000 [IU] | Freq: Every day | SUBCUTANEOUS | Status: DC
  Administered 2019-11-21 – 2019-11-23 (×3): 6 [IU] via SUBCUTANEOUS
  Filled 2019-11-21 (×4): qty 0.06

## 2019-11-21 MED ORDER — AMLODIPINE BESYLATE 10 MG PO TABS
10.0000 mg | ORAL_TABLET | Freq: Every day | ORAL | Status: DC
  Administered 2019-11-22 – 2019-12-15 (×23): 10 mg
  Filled 2019-11-21 (×23): qty 1

## 2019-11-21 MED ORDER — DOXAZOSIN MESYLATE 1 MG PO TABS
1.0000 mg | ORAL_TABLET | Freq: Every day | ORAL | Status: DC
  Administered 2019-11-22 – 2019-11-26 (×5): 1 mg
  Filled 2019-11-21 (×5): qty 1

## 2019-11-21 MED ORDER — INSULIN ASPART 100 UNIT/ML ~~LOC~~ SOLN
0.0000 [IU] | SUBCUTANEOUS | Status: DC
  Administered 2019-11-21: 3 [IU] via SUBCUTANEOUS
  Administered 2019-11-21 (×2): 1 [IU] via SUBCUTANEOUS
  Administered 2019-11-22 – 2019-11-23 (×4): 2 [IU] via SUBCUTANEOUS
  Administered 2019-11-23: 3 [IU] via SUBCUTANEOUS
  Administered 2019-11-23: 1 [IU] via SUBCUTANEOUS
  Administered 2019-11-23 (×3): 2 [IU] via SUBCUTANEOUS
  Administered 2019-11-24 (×2): 1 [IU] via SUBCUTANEOUS
  Administered 2019-11-24 (×3): 2 [IU] via SUBCUTANEOUS
  Administered 2019-11-24: 1 [IU] via SUBCUTANEOUS
  Administered 2019-11-25: 2 [IU] via SUBCUTANEOUS
  Administered 2019-11-25 (×3): 1 [IU] via SUBCUTANEOUS
  Administered 2019-11-25: 3 [IU] via SUBCUTANEOUS
  Administered 2019-11-26 (×2): 2 [IU] via SUBCUTANEOUS
  Administered 2019-11-26: 3 [IU] via SUBCUTANEOUS
  Administered 2019-11-26: 1 [IU] via SUBCUTANEOUS
  Administered 2019-11-26: 3 [IU] via SUBCUTANEOUS
  Administered 2019-11-26 – 2019-11-27 (×4): 2 [IU] via SUBCUTANEOUS
  Administered 2019-11-27 (×3): 1 [IU] via SUBCUTANEOUS
  Administered 2019-11-28 (×3): 2 [IU] via SUBCUTANEOUS
  Administered 2019-11-28 – 2019-11-29 (×4): 1 [IU] via SUBCUTANEOUS
  Administered 2019-11-29: 2 [IU] via SUBCUTANEOUS
  Administered 2019-11-29: 3 [IU] via SUBCUTANEOUS
  Administered 2019-11-30: 1 [IU] via SUBCUTANEOUS
  Administered 2019-11-30: 2 [IU] via SUBCUTANEOUS
  Administered 2019-11-30: 3 [IU] via SUBCUTANEOUS
  Administered 2019-12-01: 4 [IU] via SUBCUTANEOUS
  Administered 2019-12-01 – 2019-12-02 (×5): 2 [IU] via SUBCUTANEOUS
  Administered 2019-12-02 (×2): 3 [IU] via SUBCUTANEOUS
  Administered 2019-12-02 – 2019-12-03 (×6): 2 [IU] via SUBCUTANEOUS
  Administered 2019-12-04: 1 [IU] via SUBCUTANEOUS
  Administered 2019-12-04: 5 [IU] via SUBCUTANEOUS
  Administered 2019-12-04 (×2): 2 [IU] via SUBCUTANEOUS
  Administered 2019-12-04 (×2): 1 [IU] via SUBCUTANEOUS
  Administered 2019-12-05 (×2): 2 [IU] via SUBCUTANEOUS
  Administered 2019-12-05: 3 [IU] via SUBCUTANEOUS
  Administered 2019-12-05: 1 [IU] via SUBCUTANEOUS
  Administered 2019-12-05 – 2019-12-06 (×3): 2 [IU] via SUBCUTANEOUS
  Administered 2019-12-06 (×2): 1 [IU] via SUBCUTANEOUS
  Administered 2019-12-06 (×2): 2 [IU] via SUBCUTANEOUS
  Administered 2019-12-06: 1 [IU] via SUBCUTANEOUS
  Administered 2019-12-07: 2 [IU] via SUBCUTANEOUS
  Administered 2019-12-07: 1 [IU] via SUBCUTANEOUS
  Administered 2019-12-07 (×2): 2 [IU] via SUBCUTANEOUS
  Administered 2019-12-07: 1 [IU] via SUBCUTANEOUS
  Administered 2019-12-08 (×2): 2 [IU] via SUBCUTANEOUS
  Administered 2019-12-08: 1 [IU] via SUBCUTANEOUS
  Administered 2019-12-08: 3 [IU] via SUBCUTANEOUS
  Administered 2019-12-09: 2 [IU] via SUBCUTANEOUS
  Administered 2019-12-09: 5 [IU] via SUBCUTANEOUS
  Administered 2019-12-10: 2 [IU] via SUBCUTANEOUS
  Administered 2019-12-10: 1 [IU] via SUBCUTANEOUS
  Administered 2019-12-10 (×2): 3 [IU] via SUBCUTANEOUS
  Administered 2019-12-10: 1 [IU] via SUBCUTANEOUS
  Administered 2019-12-10: 2 [IU] via SUBCUTANEOUS
  Administered 2019-12-11: 3 [IU] via SUBCUTANEOUS

## 2019-11-21 MED ORDER — LACOSAMIDE 10 MG/ML PO SOLN: 150.0000 mg | Freq: Two times a day (BID) | ORAL | Status: DC

## 2019-11-21 MED ORDER — DOCUSATE SODIUM 50 MG/5ML PO LIQD
100.0000 mg | Freq: Two times a day (BID) | ORAL | Status: DC | PRN
  Administered 2019-11-23 – 2019-11-24 (×2): 100 mg
  Filled 2019-11-21 (×2): qty 10

## 2019-11-21 MED ORDER — ASPIRIN 81 MG PO CHEW
81.0000 mg | CHEWABLE_TABLET | Freq: Every day | ORAL | Status: DC
  Administered 2019-11-22 – 2019-12-15 (×23): 81 mg
  Filled 2019-11-21 (×23): qty 1

## 2019-11-21 MED ORDER — GUAIFENESIN 100 MG/5ML PO SOLN: 5.0000 mL | ORAL | Status: DC | PRN

## 2019-11-21 MED ORDER — LACOSAMIDE 50 MG PO TABS
150.0000 mg | ORAL_TABLET | Freq: Two times a day (BID) | ORAL | Status: DC
  Administered 2019-11-21 – 2019-12-15 (×47): 150 mg
  Filled 2019-11-21 (×47): qty 3

## 2019-11-21 MED ORDER — FAMOTIDINE 20 MG PO TABS
10.0000 mg | ORAL_TABLET | Freq: Every day | ORAL | Status: DC
  Administered 2019-11-22 – 2019-12-15 (×23): 10 mg
  Filled 2019-11-21 (×24): qty 1

## 2019-11-21 MED ORDER — OSMOLITE 1.5 CAL PO LIQD
1000.0000 mL | ORAL | Status: DC
  Administered 2019-11-21 – 2019-11-22 (×5): 1000 mL
  Filled 2019-11-21 (×2): qty 1000

## 2019-11-21 MED ORDER — PROSOURCE TF PO LIQD
45.0000 mL | Freq: Two times a day (BID) | ORAL | Status: DC
  Administered 2019-11-21 – 2019-12-15 (×47): 45 mL
  Filled 2019-11-21 (×46): qty 45

## 2019-11-21 MED ORDER — IPRATROPIUM-ALBUTEROL 0.5-2.5 (3) MG/3ML IN SOLN
3.0000 mL | Freq: Four times a day (QID) | RESPIRATORY_TRACT | Status: DC | PRN
  Administered 2019-11-27: 3 mL via RESPIRATORY_TRACT
  Filled 2019-11-21 (×2): qty 3

## 2019-11-21 MED ORDER — INSULIN ASPART 100 UNIT/ML ~~LOC~~ SOLN
2.0000 [IU] | SUBCUTANEOUS | Status: DC
  Administered 2019-11-21 – 2019-12-07 (×93): 2 [IU] via SUBCUTANEOUS

## 2019-11-21 MED ORDER — CLOPIDOGREL BISULFATE 75 MG PO TABS
75.0000 mg | ORAL_TABLET | Freq: Every day | ORAL | Status: DC
  Administered 2019-11-22 – 2019-12-15 (×23): 75 mg
  Filled 2019-11-21 (×23): qty 1

## 2019-11-21 MED ORDER — BETHANECHOL CHLORIDE 10 MG PO TABS
15.0000 mg | ORAL_TABLET | Freq: Three times a day (TID) | ORAL | Status: DC
  Administered 2019-11-21 – 2019-11-29 (×24): 15 mg
  Filled 2019-11-21 (×24): qty 2

## 2019-11-21 MED ORDER — ATORVASTATIN CALCIUM 80 MG PO TABS
80.0000 mg | ORAL_TABLET | Freq: Every day | ORAL | Status: DC
  Administered 2019-11-22 – 2019-12-15 (×23): 80 mg
  Filled 2019-11-21 (×23): qty 1

## 2019-11-21 MED ORDER — LEVETIRACETAM 100 MG/ML PO SOLN
1000.0000 mg | Freq: Two times a day (BID) | ORAL | Status: DC
  Administered 2019-11-21 – 2019-12-15 (×47): 1000 mg
  Filled 2019-11-21 (×48): qty 10

## 2019-11-21 MED ORDER — POLYETHYLENE GLYCOL 3350 17 G PO PACK
17.0000 g | PACK | Freq: Every day | ORAL | Status: DC | PRN
  Administered 2019-11-23 – 2019-11-24 (×2): 17 g
  Filled 2019-11-21 (×3): qty 1

## 2019-11-21 MED ORDER — SENNOSIDES 8.8 MG/5ML PO SYRP
5.0000 mL | ORAL_SOLUTION | Freq: Two times a day (BID) | ORAL | Status: DC
  Administered 2019-11-21 – 2019-12-15 (×47): 5 mL
  Filled 2019-11-21 (×50): qty 5

## 2019-11-21 MED ORDER — HYDRALAZINE HCL 50 MG PO TABS
50.0000 mg | ORAL_TABLET | Freq: Three times a day (TID) | ORAL | Status: DC
  Administered 2019-11-21 – 2019-12-14 (×69): 50 mg
  Filled 2019-11-21 (×68): qty 1

## 2019-11-21 NOTE — Progress Notes (Addendum)
Inpatient Rehabilitation Medication Review by a Pharmacist  A complete drug regimen review was completed for this patient to identify any potential clinically significant medication issues.  Clinically significant medication issues were identified:  yes   Type of Medication Issue Identified Description of Issue Urgent (address now) Non-Urgent (address on AM team rounds) Plan Plan Accepted by Provider? (Yes / No / Pending AM Rounds)  Drug Interaction(s) (clinically significant)       Duplicate Therapy       Allergy       No Medication Administration End Date       Incorrect Dose       Additional Drug Therapy Needed       Other  PTA meds not ordered on admission: Cholecalciferol 5,000 units daily, losartan 25 mg daily, metformin 1000 mg daily Non urgent Will contact admitting provider to clarify plan for these medications Pending AM rounds    For non-urgent medication issues to be resolved on team rounds tomorrow morning a [facility] Secure Chat Handoff was sent to: Charlton Amor, PA-C   Pharmacist comments: none  Time spent performing this drug regimen review (minutes):  20   Dereck Ligas 11/21/2019 2:55 PM

## 2019-11-21 NOTE — Discharge Summary (Signed)
[facility]  Physician Discharge Summary   Patient ID: Debra Calabretta MRN: [id number] DOB/AGE: 10-04-1960 59 y.o.  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 CBC and basic metabolic panel every 2 to 3 days. 2. Monitor Dilantin levels. 3. Voiding trial at inpatient rehab in a few days.    Home Health: Patient go to CIR. Equipment/Devices: As per CIR  CODE STATUS: Full code  DISCHARGE CONDITION: fair  Diet recommendation: Currently on tube feedings  INITIAL HISTORY: 59 year old male male with past medical history of 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 Patient is living in a assisted living facility and can ambulate by himself. Due to concerns of seizure he was intubated for airway protection and taken to the ICU. Eventually had tracheostomy placed on 8/23. Since then he has had multiple swallowing evaluation which he continues to fail and progressing only very slowly. He's now s/p PEG on 9/7.Plan for CIR at this time.  Consultants:   PCCM  Neurology  Procedures:  PEG 9/7 ETT Trach 8/19   HOSPITAL COURSE:   Acute on chronic chronic respiratory failure with hypoxemia requiring tracheostomy tube placement and mechanical ventilatory support. -Currently has tracheostomy in place. Continue supportive care. Incentive spirometry, bronchodilators as needed. -Continue with routine oral/tracheostomy care.  It appears that patient was changed over to a  #6 Shiley flexible cuffless trach on 8/30. Patient remains stable from a respiratory standpoint. Further management of tracheostomy per pulmonology.  S/p PEG Placement: -Pain surrounding PEG improved with analgesia -On 9/14 patient noted to have bleeding around the PEG site.  Apparently he had some clots.  Nursing reported that patient had been manipulating the tube.  Interventional radiology evaluated patient.  Tube was thought to be in the good position and okay to use.  A binder was placed to protect the tube. No bleeding noted around the PEG tube site.  Hemoglobin has been stable.  Status epilepticus in the setting of recent large left MCA stroke -Continue Vimpat, Keppra and Dilantin. No recent seizure activity. Monitor phenytoin levels periodically.  It was 8.7 when last checked on 9/17.  Albumin was 2.6.  Corrected phenytoin level is 10.7.  Essential hypertension Continue amlodipine and hydralazine.  High blood pressure readings noted mainly in the morning hours.  Could go up on the dose of hydralazine for better control.  Urinary Retention Continue doxazosin and bethanechol.  Failed voiding trial 9/9 - 9/10 -> continue foley. Voiding trial can be done at inpatient rehabilitation.  Diabetes type 2 with Hyperglycemia HbA1c 6.3. CBGs are reasonably well controlled. Continue Lantus and SSI.   Dysphagia. S/p peg placement.  Speech therapy continues to work with the patient.  Remains NPO.  Spoons of nectar thick liquids being given at times.  Normocytic Anemia Stable.  No overt bleeding except for bleeding around the PEG tube noticed a few days ago which has since resolved. Hemoglobin stable for the most part.  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   LFTs were normal when last checked.    Fever, improvedlikely in the setting of pneumonia with concern for aspiration, resolved CXR 9/10 with atelectasis/infitrate at R base (where endobronchial debris by recent CT). CT abd/pelvis from 9/9 with atelectasis and/or consolidation of R lower lobe with bronchitic changes.  Stable currently.  Nausea  Vomiting: Resolved. Recent CT abdomen pelvis without acute intraabdominal abnormalities. Continue with zofran prn  AKI, resolved Seems to be improved.  Monitor urine output.  Could have been related to urinary retention.  Nutrition Continue tube feedings.  Overall patient is stable.  Okay for discharge/transfer to inpatient rehab today.   PERTINENT LABS:  The results of significant diagnostics from this hospitalization (including imaging, microbiology, ancillary and laboratory) are listed below for reference.    Labs:   Basic Metabolic Panel: Recent Labs  Lab 11/15/19 0150 11/16/19 0250 11/17/19 0347 11/18/19 0217 11/20/19 0137  NA 136 138 138 135 138  K 3.8 3.9 3.4* 3.5 3.5  CL 103 105 103 101 102  CO2 '22 22 25 24 27  ' GLUCOSE 89 58* 48* 205* 105*  BUN 27* 23* 20 18 25*  CREATININE 1.36* 1.26* 1.21 1.22 1.12  CALCIUM 8.2* 8.4* 8.6* 8.6* 8.7*   Liver Function Tests: Recent Labs  Lab 11/15/19 0150 11/16/19 0250 11/17/19 0347 11/18/19 0217  AST 43* 35 27 27  ALT 37 36 31 28  ALKPHOS 129* 142* 147* 134*  BILITOT 0.2* 0.3 0.3 0.2*  PROT 6.2* 7.0 6.9 6.4*  ALBUMIN 2.5* 2.8* 2.7* 2.6*   CBC: Recent Labs  Lab 11/15/19 0150 11/16/19 0250 11/17/19 0347 11/18/19 0217 11/20/19 0137  WBC 7.0 10.5 9.0 6.4 8.2  HGB 7.8* 9.4* 9.1* 9.2* 8.9*  HCT 24.9* 29.1* 27.4* 28.1* 27.7*  MCV 92.9 93.9 91.9 92.7 91.7  PLT 274 331 345 324 328   BNP: BNP (last 3 results) Recent Labs    11/08/19 0411  BNP 419.6*     CBG: Recent Labs  Lab 11/20/19 1603 11/20/19 1954 11/20/19 2330 11/21/19 0406 11/21/19 0901  GLUCAP  158* 177* 169* 129* 199*     IMAGING STUDIES CT ABDOMEN PELVIS WO CONTRAST  Result Date: 11/12/2019 CLINICAL DATA:  Nausea and vomiting, history type II diabetes mellitus, hypertension, stroke EXAM: CT ABDOMEN AND PELVIS WITHOUT CONTRAST TECHNIQUE: Multidetector CT imaging of the abdomen and pelvis was performed following the standard protocol without IV contrast. Sagittal and coronal MPR images reconstructed from axial data set. Dilute oral contrast was administered. COMPARISON:  10/20/2019 FINDINGS: Lower chest: Minimal atelectasis at LEFT base. Atelectasis and/or consolidation RIGHT lower lobe. Associated peribronchial thickening. Hepatobiliary: Beam hardening artifacts from arms traverse liver. No focal gallbladder or liver abnormalities. Pancreas: Normal appearance Spleen: Normal appearance within limitations of streak artifacts Adrenals/Urinary Tract: Adrenal glands and kidneys normal appearance. Foley catheter and air within urinary bladder. No urinary tract calcification or dilatation. Stomach/Bowel: Rectal tube. Normal appendix. Nasogastric tube in stomach. Gastrostomy tube. Bowel loops unremarkable. Vascular/Lymphatic: Atherosclerotic calcifications aorta and iliac arteries. Aorta normal caliber. Coronary arterial calcifications present. No adenopathy. Reproductive: Mild prostatic enlargement. Vas deferens calcification. Seminal vesicles unremarkable. Other: No free air or free fluid. No hernia or acute inflammatory process. Musculoskeletal: Unremarkable IMPRESSION: No acute intra-abdominal or intrapelvic abnormalities. Mild prostatic enlargement. Scattered atherosclerotic calcifications including coronary arteries. Atelectasis and/or consolidation RIGHT lower lobe with associated bronchitic changes. Aortic Atherosclerosis (ICD10-I70.0). Electronically Signed   By: Lavonia Dana M.D.   On:  11/12/2019 14:10   DG Abd 1 View  Result Date: 11/11/2019 CLINICAL DATA:  Shortness of breath, nausea,  vomiting. History of hypertension diabetes, stroke, paralysis EXAM: ABDOMEN - 1 VIEW COMPARISON:  X-ray abdomen 10/26/2019, x-ray IR percutaneous placement gastrostomy 11/10/2019 FINDINGS: Enteric tube noted coursing below the diaphragm with tip overlying the expected region of the gastric lumen. Gastrostomy tube with inflated balloon overlying the expected region of the gastric lumen. The shaped metallic density just superior to the gastrostomy tube balloon likely represents fasteners for the tube. The bowel gas pattern is normal. Stool throughout the colon. Possible trace free intraperitoneal gas overlying the right mid abdomen which is in keeping with recent percutaneous interventional radiology procedure. No radio-opaque calculi. Visualized osseous structures demonstrate no acute abnormality. Visualized bilateral lower hemi thoraces are unremarkable. IMPRESSION: Enteric tube and gastrostomy tube overlying the expected region of the gastric lumen. Nonobstructive bowel gas pattern with stool throughout the colon. Possible trace free intraperitoneal gas overlying the right mid abdomen which is in keeping with recent percutaneous interventional radiology procedure. Electronically Signed   By: Iven Finn M.D.   On: 11/11/2019 12:05   MR BRAIN WO CONTRAST  Result Date: 11/04/2019 CLINICAL DATA:  Cranial neuropathy (9). 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 COMPARISON:  10/11/2019 FINDINGS: Brain: Large remote left MCA branch infarct also extending to the parasagittal left frontal lobe, with chronic blood products. T2 hyperintensity and swelling at the left basal ganglia and upper thalamus has subsided with volume loss, also considered post ischemic. No acute infarct.  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 Vascular: Normal flow voids Skull and upper cervical spine: Normal marrow signal. Sinuses/Orbits: Bilateral cataract resection. Mild patchy opacification of the sinuses, improved. Bilateral mastoid opacification with negative nasopharynx, progressed and potentially related to intubation. IMPRESSION: 1. 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 2. Large remote left frontal infarct. Signal abnormality and swelling in the left deep gray nuclei has subsided since 10/11/2019, now with volume loss. Electronically Signed   By: Monte Fantasia M.D.   On: 11/04/2019 07:54   IR GASTROSTOMY TUBE MOD SED  Result Date: 11/10/2019 INDICATION: 59 year old male with dysphagia referred for image guided percutaneous gastrostomy EXAM: PERC PLACEMENT GASTROSTOMY MEDICATIONS: 2 g Ancef; Antibiotics were administered within 1 hour of the procedure. Scratch ANESTHESIA/SEDATION: Versed 1.0 mg IV; Fentanyl 50 mcg IV Moderate Sedation Time:  19 minutes The patient was continuously monitored during the procedure by the interventional radiology nurse under my direct supervision. CONTRAST:  10 cc into the lumen of the stomach FLUOROSCOPY TIME:  Fluoroscopy Time: 4 minutes 30 seconds (7 mGy). COMPLICATIONS: None PROCEDURE: Informed written consent was obtained from the patient and the patient's family after a thorough discussion of the procedural risks, benefits and alternatives. All questions were addressed. Maximal Sterile Barrier Technique was utilized including caps, mask, sterile gowns, sterile gloves, sterile drape, hand hygiene and skin antiseptic. A timeout was performed prior to the initiation of the procedure. The epigastrium was prepped with Betadine in a sterile fashion, and a sterile drape was applied covering the operative field. A sterile gown and sterile gloves were used for the procedure.  A 5-French orogastric tube is placed under fluoroscopic guidance. This accounted for the majority  of the fluoroscopic time. The stomach was distended with gas. Under fluoroscopic guidance, an 18 gauge needle was utilized to puncture the anterior wall of the body of the stomach. An Amplatz wire was advanced through the needle passing a T fastener into the lumen of the stomach. The T fastener was secured for gastropexy. A 9-French sheath was inserted. A snare was advanced through the 9-French sheath. A Britta Mccreedy was advanced through the orogastric tube. It was snared then pulled out the oral cavity, pulling the snare, as well. The leading edge of the gastrostomy was attached to the snare. It was then pulled down the esophagus and out the percutaneous site. As the catheter was withdrawn through the puncture site of the abdominal wall, the leading last so of the gastrostomy broke. This was then manually grasped and withdrawn the remainder of the catheter, which also displaced the T tack. Tube was secured in place. Contrast was injected. Patient tolerated the procedure well and remained hemodynamically stable throughout. No complications were encountered and no significant blood loss encountered. IMPRESSION: Status post fluoroscopic placed percutaneous gastrostomy tube, with 20 [place] pull-through. Signed, Dulcy Fanny. Earleen Newport, DO [facility] Electronically Signed   By: Corrie Mckusick D.O.   On: 11/10/2019 16:58   DG CHEST PORT 1 VIEW  Result Date: 11/13/2019 CLINICAL DATA:  Abnormal CT of the abdomen EXAM: PORTABLE CHEST 1 VIEW COMPARISON:  Two days ago.  Abdominal CT from yesterday FINDINGS: Interstitial coarsening that is generalized. Mild streaky density at the right base, more prominent on CT comparison. No edema, effusion, or pneumothorax. Tracheostomy tube in place. Normal heart size. IMPRESSION: Atelectasis/infiltrate at the right base where there was  extensive endobronchial debris by recent CT. Electronically Signed   By: Monte Fantasia M.D.   On: 11/13/2019 06:38   DG CHEST PORT 1 VIEW  Result Date: 11/11/2019 CLINICAL DATA:  Shortness of breath with nausea and vomiting. EXAM: PORTABLE CHEST 1 VIEW COMPARISON:  Chest x-ray dated November 07, 2019. FINDINGS: Unchanged tracheostomy and feeding tubes. The heart size and mediastinal contours are within normal limits. Normal pulmonary vascularity. Improved aeration at the lung bases. No focal consolidation, pleural effusion, or pneumothorax. No acute osseous abnormality. IMPRESSION: 1. Improved aeration at the lung bases. No active cardiopulmonary disease. Electronically Signed   By: Titus Dubin M.D.   On: 11/11/2019 12:03   DG Chest Port 1 View  Result Date: 11/07/2019 CLINICAL DATA:  Dyspnea. EXAM: PORTABLE CHEST 1 VIEW COMPARISON:  Radiograph 11/04/2019 FINDINGS: Tracheostomy tube tip at the thoracic inlet. Enteric tube in place with tip below the diaphragm not included in the field of view. Hazy airspace disease in both lung bases not significantly changed from prior exam. Stable heart size and mediastinal contours. No developing pulmonary edema. No pneumothorax or large pleural effusion. Stable osseous structures. IMPRESSION: 1. No change from exam 3 days ago. Unchanged hazy bibasilar airspace disease. 2. Tracheostomy tube and enteric tube remain in place. Electronically Signed   By: Keith Rake M.D.   On: 11/07/2019 17:39   DG CHEST PORT 1 VIEW  Result Date: 11/04/2019 CLINICAL DATA:  Shortness of breath. EXAM: PORTABLE CHEST 1 VIEW COMPARISON:  11/03/2019 FINDINGS: 0643 hours. Cardiopericardial silhouette is at upper limits of normal for size. Interstitial markings are diffusely coarsened with chronic features. Right greater than left basilar predominant hazy airspace disease is similar although aeration in the left base has improved in the interval. Tracheostomy tube again noted. A feeding  tube passes into the stomach although the distal tip position is not included on the film. The visualized bony structures of the thorax show no acute abnormality. Telemetry leads overlie the chest. IMPRESSION: Interval improvement in left basilar aeration. Otherwise stable exam. Electronically Signed   By: Misty Stanley M.D.   On: 11/04/2019 08:51   DG CHEST PORT 1 VIEW  Result Date: 11/03/2019 CLINICAL DATA:  Shortness of breath. EXAM: PORTABLE CHEST 1 VIEW COMPARISON:  11/02/2019. FINDINGS: Tracheostomy tube, feeding tube in stable position. Stable cardiomegaly. Bilateral pulmonary infiltrates/edema again noted without interim change. Right costophrenic angle incompletely imaged. No prominent pleural effusion. No pneumothorax IMPRESSION: 1.  Lines and tubes in stable position. 2. Bilateral pulmonary infiltrates/edema again noted without interim change. Electronically Signed   By: [place]   On: 11/03/2019 07:45   DG CHEST PORT 1 VIEW  Result Date: 11/02/2019 CLINICAL DATA:  Dyspnea. EXAM: PORTABLE CHEST 1 VIEW COMPARISON:  10/31/2019 FINDINGS: Tracheostomy tube and feeding tube remain in place. Heart size remains within normal limits. Pulmonary hyperinflation is again seen. Decreased airspace disease is seen in both lung bases. IMPRESSION: Decreased bibasilar airspace disease. Electronically Signed   By: Marlaine Hind M.D.   On: 11/02/2019 08:09   DG CHEST PORT 1 VIEW  Result Date: 10/31/2019 CLINICAL DATA:  Shortness of breath EXAM: PORTABLE CHEST 1 VIEW COMPARISON:  Portable exam 8768 hours compared to 10/30/2019 FINDINGS: Feeding tube extends into stomach. Tracheostomy tube stable. Normal heart size, mediastinal contours, and pulmonary vascularity. Atherosclerotic calcification aorta. BILATERAL pulmonary infiltrates increased since previous exam. Potential layered RIGHT pleural effusion. No pneumothorax or acute osseous findings. IMPRESSION: BILATERAL pulmonary infiltrates slightly greater on  RIGHT, increased since previous exam; this could represent pneumonia or less likely asymmetric edema. Questionable layered RIGHT pleural effusion. Electronically Signed   By: Lavonia Dana M.D.   On: 10/31/2019 13:12   DG CHEST PORT 1 VIEW  Result Date: 10/30/2019 CLINICAL DATA:  Shortness of breath. EXAM: PORTABLE CHEST 1 VIEW COMPARISON:  Chest x-ray 10/22/2019. FINDINGS: Interim removal of left PICC line. Tracheostomy tube and feeding tube in stable position. Cardiomegaly. Diffuse bilateral interstitial prominence consistent with interstitial edema and or pneumonitis. Small right pleural effusion. IMPRESSION: 1. Interim removal of left PICC line. Tracheostomy tube and feeding tube in stable position. 2.  Cardiomegaly. 3. Diffuse bilateral interstitial prominence noted on today's exam. Small right pleural effusion noted on today's exam. CHF could present in this fashion. Pneumonitis cannot be excluded. Electronically Signed   By: Marcello Moores  Register   On: 10/30/2019 07:56   DG Chest Port 1 View  Result Date: 10/22/2019 CLINICAL DATA:  59 year old male tracheostomy placement. EXAM: PORTABLE CHEST 1 VIEW COMPARISON:  Portable chest 10/20/2019 and earlier. FINDINGS: Portable AP semi upright view at 1445 hours. Endotracheal tube exchanged for tracheostomy tube which projects over the tracheal air column. No adverse features. Enteric feeding remains in place, courses to the abdomen with tip not included. Left PICC line also now in place. Tip projects at the cavoatrial junction level. Mediastinal contours remain normal. Stable lung volumes. Allowing for portable technique the lungs are clear. No acute osseous abnormality identified. IMPRESSION: 1. Tracheostomy tube placed with no adverse features. 2. Left PICC line placed, tip at the cavoatrial junction. Stable visible enteric feeding tube. 3. No acute cardiopulmonary abnormality. Electronically Signed   By: Genevie Ann M.D.   On: 10/22/2019 14:59   DG Abd Portable  1V  Result Date: 10/26/2019 CLINICAL DATA:  Advancement of  nasogastric tube. EXAM: PORTABLE ABDOMEN - 1 VIEW COMPARISON:  Abdominal CT 10/30/2019 FINDINGS: Weighted enteric tube with tip in the left upper quadrant in the region of the body of the stomach. The tube does not appear to be post pyloric. Air-filled but nondilated colon in the upper abdomen. No small bowel distension. Improving basilar opacities from prior CT. IMPRESSION: Weighted enteric tube with tip in the left upper quadrant in the region of the body of the stomach. Electronically Signed   By: Keith Rake M.D.   On: 10/26/2019 16:31   VAS [place] UPPER EXTREMITY VENOUS DUPLEX  Result Date: 10/30/2019 UPPER VENOUS STUDY  Indications: Swelling Comparison Study: 10-19-2019 Right Upper Extremity Venous study Performing Technologist: Darlin Coco  Examination Guidelines: A complete evaluation includes B-mode imaging, spectral Doppler, color Doppler, and power Doppler as needed of all accessible portions of each vessel. Bilateral testing is considered an integral part of a complete examination. Limited examinations for reoccurring indications may be performed as noted.  Right Findings: +----------+------------+---------+-----------+----------+-----------------+ RIGHT     CompressiblePhasicitySpontaneousProperties     Summary      +----------+------------+---------+-----------+----------+-----------------+ IJV           Full       Yes       Yes                                +----------+------------+---------+-----------+----------+-----------------+ Subclavian    Full       Yes       Yes                                +----------+------------+---------+-----------+----------+-----------------+ Axillary      Full       Yes       Yes                                +----------+------------+---------+-----------+----------+-----------------+ Brachial      Full       Yes       Yes                                 +----------+------------+---------+-----------+----------+-----------------+ Radial        Full       Yes       Yes                                +----------+------------+---------+-----------+----------+-----------------+ Ulnar         Full       Yes       Yes                                +----------+------------+---------+-----------+----------+-----------------+ Cephalic      None       No        No               Age Indeterminate +----------+------------+---------+-----------+----------+-----------------+ Basilic       None       No        No               Age Indeterminate +----------+------------+---------+-----------+----------+-----------------+  Left Findings: +----------+------------+---------+-----------+----------+-------+  LEFT      CompressiblePhasicitySpontaneousPropertiesSummary +----------+------------+---------+-----------+----------+-------+ Subclavian    Full       Yes       Yes                      +----------+------------+---------+-----------+----------+-------+  Summary:  Right: No evidence of deep vein thrombosis in the upper extremity. Findings consistent with acute superficial vein thrombosis involving the right basilic vein and right cephalic vein.  Left: No evidence of thrombosis in the subclavian.  *See table(s) above for measurements and observations.  Diagnosing physician: Servando Snare MD Electronically signed by Servando Snare MD on 10/30/2019 at 11:19:54 AM.    Final    US Abdomen Limited RUQ  Result Date: 10/31/2019 CLINICAL DATA:  59 year old male with abnormal LFT. EXAM: ULTRASOUND ABDOMEN LIMITED RIGHT UPPER QUADRANT COMPARISON:  CT abdomen pelvis dated 10/20/2019. FINDINGS: Gallbladder: No gallstones or wall thickening visualized. No sonographic Murphy sign noted by sonographer. Common bile duct: Diameter: 4 mm Liver: No focal lesion identified. Within normal limits in parenchymal echogenicity. Portal vein is patent on color Doppler  imaging with normal direction of blood flow towards the liver. Other: Partially visualized small right pleural effusion. IMPRESSION: 1. Unremarkable right upper quadrant. 2. Small right pleural effusion. Electronically Signed   By: Anner Crete M.D.   On: 10/31/2019 20:53    DISCHARGE EXAMINATION: Vitals:   11/21/19 0407 11/21/19 0500 11/21/19 0750 11/21/19 0828  BP: (!) 154/68  (!) 170/67 (!) 170/67  Pulse: 78  77 83  Resp: '18  20 16  ' Temp: 98.4 F (36.9 C)  98.2 F (36.8 C)   TempSrc: Oral  Axillary   SpO2: 98%  96% 98%  Weight:  74.5 kg    Height:       General appearance: Awake alert.  In no distress Tracheostomy noted Resp: Clear to auscultation bilaterally.  Normal effort Cardio: S1-S2 is normal regular.  No S3-S4.  No rubs murmurs or bruit GI: Abdomen is soft.  Nontender nondistended.  Bowel sounds are present normal.  No masses organomegaly.  PEG tube is noted.  No bleeding    DISPOSITION: CIR     Current Inpatient Medications:  Scheduled: . amLODipine  10 mg Per Tube Daily  . aspirin  81 mg Per Tube Daily  . atorvastatin  80 mg Per Tube Daily  . bethanechol  15 mg Per Tube TID  . chlorhexidine gluconate (MEDLINE KIT)  15 mL Mouth Rinse BID  . Chlorhexidine Gluconate Cloth  6 each Topical Daily  . clopidogrel  75 mg Per Tube Daily  . doxazosin  1 mg Per Tube Daily  . famotidine  10 mg Per Tube Daily  . feeding supplement (PROSource TF)  45 mL Per Tube BID  . Gerhardt's butt cream   Topical Daily  . hydrALAZINE  50 mg Per Tube Q8H  . insulin aspart  0-9 Units Subcutaneous Q4H  . insulin aspart  2 Units Subcutaneous Q4H  . insulin glargine  6 Units Subcutaneous QHS  . lacosamide  150 mg Per Tube BID  . levETIRAcetam  1,000 mg Per Tube BID  . mouth rinse  15 mL Mouth Rinse q12n4p  . phenytoin  175 mg Per Tube TID  . potassium chloride  40 mEq Per Tube Once  . QUEtiapine  50 mg Per Tube QHS  . scopolamine  1 patch Transdermal Q72H  . sennosides  5 mL Per  Tube BID   Continuous: . sodium chloride  Stopped (11/14/19 0053)  . feeding supplement (OSMOLITE 1.5 CAL) 1,000 mL (11/21/19 0322)   QKS:KSHNGI chloride, acetaminophen (TYLENOL) oral liquid 160 mg/5 mL, docusate, guaiFENesin, iohexol, ipratropium-albuterol, labetalol, LORazepam, ondansetron (ZOFRAN) IV, polyethylene glycol    TOTAL DISCHARGE TIME: 35 mins  [facility] [employer] on [email or web address]  11/21/2019, 10:21 AM

## 2019-11-21 NOTE — Progress Notes (Signed)
PMR Admission Coordinator Pre-Admission Assessment   Patient: Corey Bennett is an 59 y.o., male MRN: [id number] DOB: 04-17-1960 Height: '6\' 1"'  (185.4 cm) Weight: 73 kg                                                                                                                                                  Insurance Information HMO:     PPO: yes     PCP:      IPA:      80/20:      OTHER:  PRIMARY: [employer]     Policy#: [id number]      Subscriber: Patient CM Name: Jeannene Patella       Phone#:      Fax#: [phone] Clinical review/updates due 0/93/2355 Pre-Cert#: [id number] Phone: [phone]      Employer:   Benefits:  Phone #: [phone]   Name: Olene Floss. Date:  03/05/2017 - still active    Deduct: $100 ($100 met)      Out of Pocket Max: OOP Max: $4,000 ($2,658 met)      Life Max: n/a  CIR: $150/day co-pay for days 1-5, $0/day co-pay for days 6+    SNF: 80% coverage, 20% co-insurance; with a limit of 60 days/cal yr Outpatient: 80% coverage, 20% co-insurance; limited to 30 visits combined PT/OT rehabilitative visits Home Health: 100% coverage, 0% co-insurance; limited by medical necessity DME:  80% coverage, 20% co-insurance Providers: In network   SECONDARY:  N/a    The "Data Collection Information Summary" for patients in Inpatient Rehabilitation Facilities with attached "Privacy Act [place] Records" was provided and verbally reviewed with: N/A   Emergency Contact Information         Contact Information     Name Relation Home Work Mobile    Jarone, [place] Brother     [place] Mother     [phone]    Bralon, [place] Brother     [phone]       Current Medical History  Patient Admitting Diagnosis: CVA    History of Present Illness: Corey Bennett is a 59 year old male with past medical history of 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  Patient is living in a assisted living facility and can ambulate by himself.  Patient was found by staff to have a right sided twitching with right eye deviation.  He was given Versed and Dilantin and was intubated for airway protection. CT/MRI showed large territory chronic hemorrhagic infarct in the left frontal lobe.  Ill-defined hyperintensities throughout the left basal ganglia and thalamus.  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  Patient remained on  aspirin Plavix for CVA prophylaxis Patient was extubated on 8/11 and his seizures was controlled with Keppra, phenytoin, and Vimpat.  He was transferred to [address] for medical management On 8/14, patient continued to have seizure with twitching of the right upper extremity and  2 mg of lorazepam was given. Patient continued to desat in the 80s on 15 L of nonrebreather mask with altered mental status.  Patient was re-intubated to protect airway and transferred to the ICU. On 10/22/2019 he had a regular tracheostomy, has a cuffed shiley #6. Pt. Tolerating PMV on 5 L via trach collar.   Pt.  has had multiple MBS and clinical swallowing evaluations and has been recommended to remain NPO with occasional sips of nectar thick liquid. PEG placed on 9/7  per Dr. Earleen Newport of interventional radiology.  Patient bouts of urinary retention maintained on Urecholine.  Therapy evaluations completed and patient was admitted for a comprehensive rehab program.   Complete NIHSS TOTAL: 12 Glasgow Coma Scale Score: 15   Past Medical History      Past Medical History:  Diagnosis Date  . Carotid artery occlusion    . Diabetes ([place])    . Hypercholesterolemia    . Hypertension    . 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 Stroke [facility])        Family History  family history is not on file.   Prior  Rehab/Hospitalizations:  Has the patient had prior rehab or hospitalizations prior to admission? Yes   Has the patient had major surgery during 100 days prior to admission? Yes   Current Medications    Current Facility-Administered Medications:  .  0.9 %  sodium chloride infusion, , Intravenous, PRN, Kipp Brood, MD, Stopped at 11/14/19 0053 .  acetaminophen (TYLENOL) 160 MG/5ML solution 650 mg, 650 mg, Per Tube, Q6H PRN, Kipp Brood, MD, 650 mg at 11/19/19 1623 .  amLODipine (NORVASC) tablet 10 mg, 10 mg, Per Tube, Daily, Agarwala, Ravi, MD, 10 mg at 11/20/19 0921 .  aspirin chewable tablet 81 mg, 81 mg, Per Tube, Daily, Elodia Florence., MD, 81 mg at 11/20/19 0921 .  atorvastatin (LIPITOR) tablet 80 mg, 80 mg, Per Tube, Daily, Agarwala, Ravi, MD, 80 mg at 11/20/19 0922 .  bethanechol (URECHOLINE) tablet 15 mg, 15 mg, Per Tube, TID, Kipp Brood, MD, 15 mg at 11/20/19 0921 .  chlorhexidine gluconate (MEDLINE KIT) (PERIDEX) 0.12 % solution 15 mL, 15 mL, Mouth Rinse, BID, Agarwala, Ravi, MD, 15 mL at 11/19/19 1000 .  Chlorhexidine Gluconate Cloth 2 % PADS 6 each, 6 each, Topical, Daily, Elodia Florence., MD, 6 each at 11/19/19 1105 .  clopidogrel (PLAVIX) tablet 75 mg, 75 mg, Per Tube, Daily, Elodia Florence., MD, 75 mg at 11/20/19 6948 .  docusate (COLACE) 50 MG/5ML liquid 100 mg, 100 mg, Per Tube, BID PRN, Amin, Ankit Chirag, MD, 100 mg at 11/16/19 0932 .  doxazosin (CARDURA) tablet 1 mg, 1 mg, Per Tube, Daily, Elodia Florence., MD, 1 mg at 11/20/19 1132 .  famotidine (PEPCID) tablet 10 mg, 10 mg, Per Tube, Daily, Donne Hazel, MD, 10 mg at 11/20/19 5462 .  feeding supplement (OSMOLITE 1.5 CAL) liquid 1,000 mL, 1,000 mL, Per Tube, Continuous, Elodia Florence., MD, Last Rate: 55 mL/hr at 11/20/19 0507, 1,000 mL at 11/20/19 0507 .  feeding supplement (PROSource TF) liquid 45 mL, 45 mL, Per Tube, BID, Elodia Florence., MD,  45 mL at 11/20/19 0923 .   Gerhardt's butt cream, , Topical, Daily, Kipp Brood, MD, Given at 11/20/19 1045 .  guaiFENesin (ROBITUSSIN) 100 MG/5ML solution 100 mg, 5 mL, Per Tube, Q4H PRN, Dimple Nanas, RPH, 100 mg at 11/18/19 2114 .  hydrALAZINE (APRESOLINE) tablet 50 mg, 50 mg, Per Tube, Q8H, Donne Hazel, MD, 50 mg at 11/20/19 0505 .  insulin aspart (novoLOG) injection 0-9 Units, 0-9 Units, Subcutaneous, Q4H, Donne Hazel, MD, 2 Units at 11/20/19 [phone] .  insulin aspart (novoLOG) injection 2 Units, 2 Units, Subcutaneous, Q4H, Raiford Noble [place], [place], 2 Units at 11/20/19 7673 .  insulin glargine (LANTUS) injection 6 Units, 6 Units, Subcutaneous, QHS, Donne Hazel, MD, 6 Units at 11/19/19 2114 .  iohexol (OMNIPAQUE) 300 MG/ML solution 50 mL, 50 mL, Per Tube, Once PRN, Corrie Mckusick, DO .  ipratropium-albuterol (DUONEB) 0.5-2.5 (3) MG/3ML nebulizer solution 3 mL, 3 mL, Nebulization, Q6H PRN, Agarwala, Ravi, MD, 3 mL at 11/09/19 1211 .  labetalol (NORMODYNE) injection 5 mg, 5 mg, Intravenous, Q2H PRN, Kipp Brood, MD, 5 mg at 10/13/19 0304 .  lacosamide (VIMPAT) oral solution 150 mg, 150 mg, Per Tube, BID, Elodia Florence., MD, 150 mg at 11/20/19 0923 .  levETIRAcetam (KEPPRA) 100 MG/ML solution 1,000 mg, 1,000 mg, Per Tube, BID, Elodia Florence., MD, 1,000 mg at 11/20/19 4193 .  LORazepam (ATIVAN) injection 2 mg, 2 mg, Intravenous, PRN, Kipp Brood, MD, 2 mg at 10/17/19 0948 .  MEDLINE mouth rinse, 15 mL, Mouth Rinse, q12n4p, Agarwala, Ravi, MD, 15 mL at 11/19/19 1807 .  ondansetron (ZOFRAN) injection 4 mg, 4 mg, Intravenous, Q6H PRN, Elodia Florence., MD, 4 mg at 11/11/19 1124 .  phenytoin (DILANTIN) 125 MG/5ML suspension 175 mg, 175 mg, Per Tube, TID, Elodia Florence., MD, 175 mg at 11/20/19 1132 .  polyethylene glycol (MIRALAX / GLYCOLAX) packet 17 g, 17 g, Per Tube, Daily PRN, Kipp Brood, MD, 17 g at 11/14/19 0858 .  QUEtiapine (SEROQUEL) tablet 50 mg, 50 mg, Per Tube,  QHS, Agarwala, Ravi, MD, 50 mg at 11/19/19 2111 .  scopolamine (TRANSDERM-SCOP) 1 MG/3DAYS 1.5 mg, 1 patch, Transdermal, Q72H, Zierle-Ghosh, Asia B, DO, 1.5 mg at 11/18/19 2113 .  sennosides (SENOKOT) 8.8 MG/5ML syrup 5 mL, 5 mL, Per Tube, BID, Agarwala, Ravi, MD, 5 mL at 11/20/19 1133   Patients Current Diet:     Diet Order                      Diet NPO time specified  Diet effective midnight                      Precautions / Restrictions Precautions Precautions: Fall Precaution Comments: trach, PEG (abdominal binder) Restrictions Weight Bearing Restrictions: No    Has the patient had 2 or more falls or a fall with injury in the past year?Unknown   Prior Activity Level Household: Pt. was a resident in ALF   Prior Functional Level Prior Function Level of Independence: Needs assistance Gait / Transfers Assistance Needed: walking without device at ALF ADL's / Homemaking Assistance Needed: Pt indicates he required some assistance with bathing, dressing, and that he was able to perform toileting mod I.  He also indicates he was able to self feed, and his meals were delivered to his room.  No family present to confirm accuracy of info.  Communication / Swallowing Assistance Needed: expressive and receptive  aphasias at baseline per chart review   Self Care: Did the patient need help bathing, dressing, using the toilet or eating?  Independent   Indoor Mobility: Did the patient need assistance with walking from room to room (with or without device)? Independent   Stairs: Did the patient need assistance with internal or external stairs (with or without device)? Independent   Functional Cognition: Did the patient need help planning regular tasks such as shopping or remembering to take medications? Dependent   Home Assistive Devices / Equipment Home Assistive Devices/Equipment: None   Prior Device Use: Indicate devices/aids used by the patient prior to current illness,  exacerbation or injury? none   Current Functional Level Cognition   Arousal/Alertness: Awake/alert Overall Cognitive Status: Difficult to assess Difficult to assess due to: Tracheostomy Orientation Level: Oriented to person Following Commands: Follows one step commands consistently Safety/Judgement: Decreased awareness of safety, Decreased awareness of deficits General Comments: pt follows one step commands consistently, reduced awareness of deficits (especially posterior LOB)    Extremity Assessment (includes Sensation/Coordination)   Upper Extremity Assessment: RUE deficits/detail RUE Deficits / Details: no activation of hand or wrist noted. pt with flexion pattern at rest and painful with full extension. resting hand splint modified to allow neutral wrist instead of wrist extension. pt does not tolerate wrist extension RUE Sensation: decreased proprioception, decreased light touch RUE Coordination: decreased fine motor, decreased gross motor  Lower Extremity Assessment: Defer to PT evaluation RLE Deficits / Details: grossly 3/5, ROM WFL     ADLs   Overall ADL's : Needs assistance/impaired Eating/Feeding: NPO Eating/Feeding Details (indicate cue type and reason): requires assist for thoroughness when washing face  Grooming: Maximal assistance, Standing (total +2 mod (A) with posterior lean) Grooming Details (indicate cue type and reason): sitting in bed with HOB elevated, washing face with setup assist but mod assist to wash hands  Upper Body Bathing: Moderate assistance, Sitting Lower Body Bathing: Moderate assistance, Sit to/from stand Upper Body Dressing : Moderate assistance, Sitting Lower Body Dressing: Moderate assistance, Sit to/from stand Lower Body Dressing Details (indicate cue type and reason): Pt able to don socks with min A to get sock started over toes and for balance  Toilet Transfer: +2 for physical assistance, Moderate assistance Toilet Transfer Details (indicate  cue type and reason): simualted Toileting- Clothing Manipulation and Hygiene: Total assistance, Sit to/from stand Toileting - Clothing Manipulation Details (indicate cue type and reason): flexi seal leaking and pt was assisted with peri care in standing  Functional mobility during ADLs: Moderate assistance, +2 for physical assistance, +2 for safety/equipment General ADL Comments: pt reports "no to alot of questions     Mobility   Overal bed mobility: Needs Assistance Bed Mobility: Supine to Sit Rolling: Min assist Sidelying to sit: Mod assist Supine to sit: Supervision Sit to supine: Min guard General bed mobility comments: supervision for lines and safety; vc to complete task after stopping midway     Transfers   Overall transfer level: Needs assistance Equipment used: Rolling walker (2 wheeled) Transfer via Lift Equipment: Stedy Transfers: Sit to/from Stand Sit to Stand: Mod assist (due to posterior LOB) Stand pivot transfers: Mod assist, +2 physical assistance, +2 safety/equipment Squat pivot transfers: Mod assist General transfer comment: stood up quickly with posterior sway requiring up to mod assist to recover due (no righting reaction)     Ambulation / Gait / Stairs / Wheelchair Mobility   Ambulation/Gait Ambulation/Gait assistance: Mod assist, Min assist Gait Distance (Feet): 90 Feet Assistive  device: 2 person hand held assist (Pt placed left hand/arm on PTs shoulder; OT on RUE for WB'g ) Gait Pattern/deviations: Decreased dorsiflexion - right, Decreased step length - right, Drifts right/left, Step-through pattern, Decreased weight shift to right, Scissoring, Narrow base of support General Gait Details: pt able to widen steps (step RLE more laterally) with verbal and visual cues (provided by SLP who arrived to see pt as ambulating); patient developed antalgic gait (favoring RLE) with rt knee maintained in Flexion  Gait velocity: reduced Gait velocity interpretation: <1.31  ft/sec, indicative of household ambulator     Posture / Balance Dynamic Sitting Balance Sitting balance - Comments: reliant on LUE support of bed Balance Overall balance assessment: Needs assistance Sitting-balance support: No upper extremity supported, Feet supported Sitting balance-Leahy Scale: Good Sitting balance - Comments: reliant on LUE support of bed Standing balance support: No upper extremity supported Standing balance-Leahy Scale: Poor Standing balance comment: standing at sink and removed LUE support from counter with pt immediately with posterior lean/LOB     Special needs/care consideration Trach size #6 Shiley uncuffed with PMV,  Skin: abrasion to the medial chest  Diabetic management: pt. With DM2, managed with Lantus, Novolog Special service needs Pt. With trach, PEG        Previous Home Environment (from acute therapy documentation) Type of Home: Assisted living Home Care Services: No Additional Comments: [facility].  Pt resided in [place] prior to stroke 06/2019   Discharge Living Setting Plans for Discharge Living Setting: House Type of Home at Discharge: House Discharge Home Layout: One level Discharge Home Access: Level entry Discharge Bathroom Shower/Tub: Walk-in shower Discharge Bathroom Toilet: Standard Discharge Bathroom Accessibility: Yes How Accessible: Accessible via walker Does the patient have any problems obtaining your medications?: No   Social/Family/Support Systems Patient Roles: Other (Comment) Contact Information: [phone] Anticipated Caregiver: Kerwin Augustus  Anticipated Caregiver's Contact Information: [phone] Ability/Limitations of Caregiver:  (Plan to pay caregivers to come in the home for care) Caregiver Availability: 24/7 Discharge Plan Discussed with Primary Caregiver: Yes Is Caregiver In Agreement with Plan?: Yes Does Caregiver/Family have Issues with Lodging/Transportation while Pt is in Rehab?: No   I spoke with  pt.'s brother, Jori Moll, and the plan is not to return to [facility]. They plan to take patient back to his parent's home in [place], [place] and either pay for 24/7 nursing care or pay for a long term placement in a SNF. He is aware that insurance would not cover SNF after CIR and he states that he is fine with paying out of pocket.    Goals Patient/Family Goal for Rehab: PT/OT Min A, SLP Mod A Expected length of stay: 14-18 days Pt/Family Agrees to Admission and willing to participate: Yes Program Orientation Provided & Reviewed with Pt/Caregiver Including Roles  & Responsibilities: Yes  Barriers to Discharge: Trach, Lack of/limited family support, Insurance for SNF coverage, Nutrition means     Decrease burden of Care through IP rehab admission: Specialzed equipment needs, Decannulation, Diet advancement, Decrease number of caregivers and Patient/family education     Possible need for SNF placement upon discharge:Not anticipated     Patient Condition: This patient's medical and functional status has changed since the consult dated: 11/02/2019 in which the Rehabilitation Physician determined and documented that the patient's condition is appropriate for intensive rehabilitative care in an inpatient rehabilitation facility. See "History of Present Illness" (above) for medical update. Functional changes are: Pt. Now mod A-Min A with PT/OT, walking 90 ft with  mod-min A, tolerating small amounts of PO with SLP. Patient's medical and functional status update has been discussed with the Rehabilitation physician and patient remains appropriate for inpatient rehabilitation. Will plan  admit to inpatient rehab tomorrow when bed becomes available.   Preadmission Screen Completed By:  Genella Mech, CCC-SLP, 11/20/2019 12:18 PM ______________________________________________________________________   Discussed status with Dr. Naaman Plummer on 11/20/2019 at 52 and received approval for admission tomorrow, when  bed becomes available .   Admission Coordinator:  Genella Mech, time 1221/Date 9/17//21

## 2019-11-21 NOTE — Progress Notes (Signed)
Pt discharge education and instructions completed. Pt discharged to CIR and report called off to receiving nurse. Pt transported off unit via bed with belongings to the side and pt feeding intact and transfusing. Dionne Bucy RN

## 2019-11-21 NOTE — Progress Notes (Signed)
Brief Nutrition Note RD working remotely.   Consult received for enteral/tube feeding initiation and management.  Patient has PEG which was placed on 11/13/19. He is currently ordered Osmolite 1.5 @ 55 ml/hr with 45 ml Prosource TF BID. This regimen is providing 2060 kcal, 105 grams protein, and 1006 ml free water.  Adult Enteral Nutrition Protocol initiated. Full assessment to follow.  Admitting Dx: Left middle cerebral artery stroke (HCC) [I63.512]  Body mass index is 21.06 kg/m. Pt meets criteria for normal weight based on current BMI.  Labs:  Recent Labs  Lab 11/17/19 0347 11/18/19 0217 11/20/19 0137  NA 138 135 138  K 3.4* 3.5 3.5  CL 103 101 102  CO2 25 24 27   BUN 20 18 25*  CREATININE 1.21 1.22 1.12  CALCIUM 8.6* 8.6* 8.7*  GLUCOSE 48* 205* 105*       , MS, RD, LDN, CNSC Inpatient Clinical Dietitian RD pager # available in [facility]  After hours/weekend pager # available in [facility]

## 2019-11-21 NOTE — H&P (Signed)
Physical Medicine and Rehabilitation Admission H&P        Chief Complaint  Patient presents with  . Code Stroke  : HPI: Corey Bennett is a 59 year old right-handed male with history of diabetes mellitus, hypertension, hyperlipidemia, left MCA CVA 4/ 2021 while living in [place] underwent rehabilitation at [facility] with residual aphasia and right-sided weakness as well as left CEA 09/2019 who resides presently at assisted living facility[facility].  Presented 10/10/2019 with seizure-like activity requiring intubation.  CT/MRI showed large territory chronic hemorrhagic infarct in the left frontal lobe.  Ill-defined hyperintensities throughout the left basal ganglia and thalamus.  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  Patient remained on aspirin Plavix for CVA prophylaxis.    Keppra/Dilantin and Vimpat ordered for seizure disorder.  Patient did initially require intubation extubated 10/14/2019 however continued to have desaturations into the 80s 15 L nonrebreather mask requiring reintubation and tracheostomy performed 10/22/2019 presently with a #6 Shiley cuffless trach as of 11/02/2019.  Currently n.p.o. with gastrostomy tube placed 11/10/2019 per Dr. Loreta Ave of interventional radiology.  Patient bouts of urinary retention maintained on Urecholine.  Therapy evaluations completed and patient was admitted for a comprehensive rehab program. Review of Systems  Unable to perform ROS: Acuity of condition        Past Medical History:  Diagnosis Date  . Carotid artery occlusion    . Diabetes (HCC)    . Hypercholesterolemia    . Hypertension    . 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 Stroke [facility])           Past Surgical History:  Procedure Laterality Date  . ENDARTERECTOMY Left 09/08/2019    Procedure: LEFT CAROTID ENDARTERECTOMY;  Surgeon: Chuck Hint, MD;  Location: [facility] OR;  Service: Vascular;  Laterality: Left;  . IR GASTROSTOMY TUBE MOD SED   11/10/2019  . PATCH ANGIOPLASTY Left 09/08/2019    Procedure: PATCH ANGIOPLASTY USING XENOSURE BIOLOGIC PATCH 1x6cm;  Surgeon: Chuck Hint, MD;  Location: [facility] OR;  Service: Vascular;  Laterality: Left;  . TONSILLECTOMY        No family history on file. Social History:  reports that he has been smoking cigarettes. He has been smoking about 1.00 pack per day. He has never used smokeless tobacco. He reports current alcohol use. He reports that he does not use drugs. Allergies: No Known Allergies       Medications Prior to Admission  Medication Sig Dispense Refill  . amLODipine (NORVASC) 10 MG tablet Take 10 mg by mouth daily.       Marland Kitchen aspirin 81 MG chewable tablet Chew 81 mg by mouth daily.      . chlorhexidine (PERIDEX) 0.12 % solution Use as directed 15 mLs in the mouth or throat 2 (two) times daily.      . Cholecalciferol (VITAMIN D-3) 125 MCG (5000 UT) TABS Take 5,000 Units by mouth daily.       . clopidogrel (PLAVIX) 75 MG tablet Take 1 tablet (75 mg total) by mouth daily. 30 tablet 11  . HYDROcodone-acetaminophen (NORCO/VICODIN) 5-325 MG tablet Take 1 tablet by mouth every 6 (six) hours as needed for moderate pain.      Marland Kitchen insulin glargine (LANTUS) 100 UNIT/ML injection Inject 12 Units into the skin at bedtime.      . insulin lispro (HUMALOG) 100 UNIT/ML injection Inject  5 Units into the skin 3 (three) times daily before meals. *HOLD INSULIN IF BLOOD GLUCOSE IS LESS THAN 120.*      . losartan (COZAAR) 25 MG tablet Take 25 mg by mouth daily.       . metFORMIN (GLUCOPHAGE) 1000 MG tablet Take 1,000 mg by mouth 2 (two) times daily with a meal.       . penicillin v potassium (VEETID) 500 MG tablet Take 500 mg by mouth 4 (four) times daily.      Marland Kitchen senna (SENOKOT) 8.6 MG TABS tablet Take 1 tablet by mouth daily.          Drug Regimen Review  Drug regimen was reviewed and remains  appropriate with no significant issues identified   Home: Home Living Family/patient expects to be discharged to:: Unsure Type of Home: Assisted living Additional Comments: [facility].  [place] prior to stroke 06/2019   Functional History: Prior Function Level of Independence: Needs assistance Gait / Transfers Assistance Needed: walking without device at ALF ADL's / Homemaking Assistance Needed: Pt indicates he required some assistance with bathing, dressing, and that he was able to perform toileting mod I.  He also indicates he was able to self feed, and his meals were delivered to his room.  No family present to confirm accuracy of info.  Communication / Swallowing Assistance Needed: expressive and receptive aphasias at baseline per chart review   Functional Status:  Mobility: Bed Mobility Overal bed mobility: Needs Assistance Bed Mobility: Supine to Sit Rolling: Min assist Sidelying to sit: Mod assist Supine to sit: Supervision Sit to supine: Min guard General bed mobility comments: supervision for lines and safety; vc to complete task after stopping midway Transfers Overall transfer level: Needs assistance Equipment used: Rolling walker (2 wheeled) Transfer via Lift Equipment: Stedy Transfers: Sit to/from Stand Sit to Stand: Mod assist (due to posterior LOB) Stand pivot transfers: Mod assist, +2 physical assistance, +2 safety/equipment Squat pivot transfers: Mod assist General transfer comment: stood up quickly with posterior sway requiring up to mod assist to recover due (no righting reaction) Ambulation/Gait Ambulation/Gait assistance: Mod assist, Min assist Gait Distance (Feet): 90 Feet Assistive device: 2 person hand held assist (Pt placed left hand/arm on PTs shoulder; OT on RUE for WB'g ) Gait Pattern/deviations: Decreased dorsiflexion - right, Decreased step length - right, Drifts right/left, Step-through pattern, Decreased weight shift to right,  Scissoring, Narrow base of support General Gait Details: pt able to widen steps (step RLE more laterally) with verbal and visual cues (provided by SLP who arrived to see pt as ambulating); patient developed antalgic gait (favoring RLE) with rt knee maintained in Flexion  Gait velocity: reduced Gait velocity interpretation: <1.31 ft/sec, indicative of household ambulator   ADL: ADL Overall ADL's : Needs assistance/impaired Eating/Feeding: NPO Eating/Feeding Details (indicate cue type and reason): requires assist for thoroughness when washing face  Grooming: Maximal assistance, Standing (total +2 mod (A) with posterior lean) Grooming Details (indicate cue type and reason): sitting in bed with HOB elevated, washing face with setup assist but mod assist to wash hands  Upper Body Bathing: Moderate assistance, Sitting Lower Body Bathing: Moderate assistance, Sit to/from stand Upper Body Dressing : Moderate assistance, Sitting Lower Body Dressing: Moderate assistance, Sit to/from stand Lower Body Dressing Details (indicate cue type and reason): Pt able to don socks with min A to get sock started over toes and for balance  Toilet Transfer: +2 for physical assistance, Moderate assistance Toilet Transfer  Details (indicate cue type and reason): simualted Toileting- Clothing Manipulation and Hygiene: Total assistance, Sit to/from stand Toileting - Clothing Manipulation Details (indicate cue type and reason): flexi seal leaking and pt was assisted with peri care in standing  Functional mobility during ADLs: Moderate assistance, +2 for physical assistance, +2 for safety/equipment General ADL Comments: pt reports "no to alot of questions   Cognition: Cognition Overall Cognitive Status: Difficult to assess Arousal/Alertness: Awake/alert Orientation Level: Oriented to person Cognition Arousal/Alertness: Awake/alert Behavior During Therapy:  (Frustrated/annoyed by communication difficulties) Overall  Cognitive Status: Difficult to assess Area of Impairment: Following commands, Safety/judgement, Awareness, Problem solving Memory: Decreased short-term memory Following Commands: Follows one step commands consistently Safety/Judgement: Decreased awareness of safety, Decreased awareness of deficits Awareness: Intellectual Problem Solving: Requires verbal cues, Requires tactile cues General Comments: pt follows one step commands consistently, reduced awareness of deficits (especially posterior LOB) Difficult to assess due to: Tracheostomy   Physical Exam: Blood pressure (!) 153/64, pulse 84, temperature 98 F (36.7 C), temperature source Oral, resp. rate 17, height 6\' 1"  (1.854 m), weight 73 kg, SpO2 100 %. Physical Exam Neck:     Comments: Tracheostomy tube in place Abdominal:     Comments: Gastrostomy tube in place  Neurological:     Comments: Patient is alert.  Makes eye contact with examiner.  He was able to occlude his trach tube in state his name.  Follows simple verbal commands.     General: No acute distress Mood and affect are flat Heart: Regular rate and rhythm no rubs murmurs or extra sounds Lungs: Clear to auscultation, breathing unlabored, no rales or wheezes Abdomen: Positive bowel sounds, soft nontender to palpation, nondistended Extremities: No clubbing, cyanosis, or edema Skin: No evidence of breakdown, no evidence of rash Neurologic: Cranial nerves II through XII intact, motor strength is 3/5 right deltoid bicep tricep grip hip flexor knee extensor ankle dorsiflexor, 5/5 in Left deltoid, bicep, tricep, grip, hip flexor, knee extensors, ankle dorsiflexor and plantar flexor Sensory exam difficult to assess secondary to communication Musculoskeletal: Full range of motion in all 4 extremities. No joint swelling    Lab Results Last 48 Hours  Results for orders placed or performed during the hospital encounter of 10/10/19 (from the past 48 hour(s))  Glucose, capillary      Status: Abnormal    Collection Time: 11/18/19 12:40 PM  Result Value Ref Range    Glucose-Capillary 170 (H) 70 - 99 mg/dL      Comment: Glucose reference range applies only to samples taken after fasting for at least 8 hours.    Comment 1 Notify RN      Comment 2 Document in Chart    Glucose, capillary     Status: Abnormal    Collection Time: 11/18/19  4:01 PM  Result Value Ref Range    Glucose-Capillary 173 (H) 70 - 99 mg/dL      Comment: Glucose reference range applies only to samples taken after fasting for at least 8 hours.    Comment 1 Notify RN      Comment 2 Document in Chart    Glucose, capillary     Status: Abnormal    Collection Time: 11/18/19  8:10 PM  Result Value Ref Range    Glucose-Capillary 165 (H) 70 - 99 mg/dL      Comment: Glucose reference range applies only to samples taken after fasting for at least 8 hours.  Glucose, capillary     Status: Abnormal  Collection Time: 11/18/19 11:58 PM  Result Value Ref Range    Glucose-Capillary 164 (H) 70 - 99 mg/dL      Comment: Glucose reference range applies only to samples taken after fasting for at least 8 hours.  Glucose, capillary     Status: Abnormal    Collection Time: 11/19/19  4:17 AM  Result Value Ref Range    Glucose-Capillary 154 (H) 70 - 99 mg/dL      Comment: Glucose reference range applies only to samples taken after fasting for at least 8 hours.  Glucose, capillary     Status: Abnormal    Collection Time: 11/19/19  8:21 AM  Result Value Ref Range    Glucose-Capillary 160 (H) 70 - 99 mg/dL      Comment: Glucose reference range applies only to samples taken after fasting for at least 8 hours.    Comment 1 Notify RN      Comment 2 Document in Chart    Glucose, capillary     Status: Abnormal    Collection Time: 11/19/19 12:05 PM  Result Value Ref Range    Glucose-Capillary 168 (H) 70 - 99 mg/dL      Comment: Glucose reference range applies only to samples taken after fasting for at least 8 hours.     Comment 1 Notify RN      Comment 2 Document in Chart    Glucose, capillary     Status: Abnormal    Collection Time: 11/19/19  3:57 PM  Result Value Ref Range    Glucose-Capillary 198 (H) 70 - 99 mg/dL      Comment: Glucose reference range applies only to samples taken after fasting for at least 8 hours.    Comment 1 Notify RN      Comment 2 Document in Chart    Glucose, capillary     Status: Abnormal    Collection Time: 11/19/19  7:18 PM  Result Value Ref Range    Glucose-Capillary 109 (H) 70 - 99 mg/dL      Comment: Glucose reference range applies only to samples taken after fasting for at least 8 hours.  Glucose, capillary     Status: Abnormal    Collection Time: 11/19/19 11:25 PM  Result Value Ref Range    Glucose-Capillary 159 (H) 70 - 99 mg/dL      Comment: Glucose reference range applies only to samples taken after fasting for at least 8 hours.    Comment 1 Notify RN      Comment 2 Document in Chart    CBC     Status: Abnormal    Collection Time: 11/20/19  1:37 AM  Result Value Ref Range    WBC 8.2 4.0 - 10.5 K/uL    RBC 3.02 (L) 4.22 - 5.81 MIL/uL    Hemoglobin 8.9 (L) 13.0 - 17.0 g/dL    HCT 16.1 (L) 39 - 52 %    MCV 91.7 80.0 - 100.0 fL    MCH 29.5 26.0 - 34.0 pg    MCHC 32.1 30.0 - 36.0 g/dL    RDW 09.6 04.5 - 40.9 %    Platelets 328 150 - 400 K/uL    nRBC 0.0 0.0 - 0.2 %      Comment: Performed at [facility], [address] [place], [place] [address]  Basic metabolic panel     Status: Abnormal    Collection Time: 11/20/19  1:37 AM  Result Value Ref Range  Sodium 138 135 - 145 mmol/L    Potassium 3.5 3.5 - 5.1 mmol/L    Chloride 102 98 - 111 mmol/L    CO2 27 22 - 32 mmol/L    Glucose, Bld 105 (H) 70 - 99 mg/dL      Comment: Glucose reference range applies only to samples taken after fasting for at least 8 hours.    BUN 25 (H) 6 - 20 mg/dL    Creatinine, Ser 4.091.12 0.61 - 1.24 mg/dL    Calcium 8.7 (L) 8.9 - 10.3 mg/dL    GFR calc non Af Amer >60 >60  mL/min    GFR calc Af Amer >60 >60 mL/min    Anion gap 9 5 - 15      Comment: Performed at [facility], [address] [place], [place] [address]  Phenytoin level, total     Status: Abnormal    Collection Time: 11/20/19  1:37 AM  Result Value Ref Range    Phenytoin Lvl 8.7 (L) 10.0 - 20.0 ug/mL      Comment: Performed at [facility], [address] [place], [place] [address]  Glucose, capillary     Status: Abnormal    Collection Time: 11/20/19  3:53 AM  Result Value Ref Range    Glucose-Capillary 154 (H) 70 - 99 mg/dL      Comment: Glucose reference range applies only to samples taken after fasting for at least 8 hours.  Glucose, capillary     Status: Abnormal    Collection Time: 11/20/19  8:15 AM  Result Value Ref Range    Glucose-Capillary 113 (H) 70 - 99 mg/dL      Comment: Glucose reference range applies only to samples taken after fasting for at least 8 hours.    Comment 1 Notify RN      Comment 2 Document in Chart        Imaging Results (Last 48 hours)  No results found.           Medical Problem List and Plan: 1.  Right hemiparesis with severe aphasia/dysphagia secondary to left MCA distribution infarct as well as history of left MCA infarct             -patient may not shower until decannulated             -ELOS/Goals: 21-24d 2.  Antithrombotics: -DVT/anticoagulation: SCDs             -antiplatelet therapy: Aspirin 81 mg daily and Plavix 75 mg daily 3. Pain Management: Tylenol as needed 4. Mood: Provide emotional support             -antipsychotic agents: Seroquel 50 mg nightly 5. Neuropsych: This patient is not capable of making decisions on his own behalf. 6. Skin/Wound Care: Routine skin checks 7. Fluids/Electrolytes/Nutrition: Routine in and outs with follow-up chemistries 8.  Seizure disorder.  Dilantin 175 mg 3 times daily, Keppra 1000 mg twice daily, Vimpat 150 mg twice daily.  Follow-up neurology services 9.  Diabetes mellitus.  Hemoglobin  A1c 7.2.  Lantus insulin 6 units nightly, NovoLog 2 units every 4 hours. 10.  Dysphagia.  NPO.  Status post gastrostomy tube 11/10/2019 per interventional radiology.  Follow-up speech therapy 11.  Hypertension.  Hydralazine 50 mg every 8 hours, Cardura 1 mg daily, Norvasc 10 mg daily.  Monitor with increased mobility 12.  Acute on chronic respiratory failure.  Status post tracheostomy tube.  Presently with a #6 Shiley flexible  cuffless trach as of 11/02/2019 followed by pulmonary services Dr. Levon Hedger 13.  Hyperlipidemia.  Lipitor 14.  Urinary retention.  Urecholine 50 mg 3 times daily.  Check PVR        Charlton Amor, PA-C 11/20/2019 "I have personally performed a face to face diagnostic evaluation of this patient.  Additionally, I have reviewed and concur with the physician assistant's documentation above." Erick Colace M.D. [facility] FAAPM&R (Neuromuscular Med) Diplomate Am Board of Electrodiagnostic Med Fellow Am Board of Interventional Pain Dr. Gwendalyn Ege

## 2019-11-22 ENCOUNTER — Inpatient Hospital Stay (HOSPITAL_COMMUNITY): Payer: BC Managed Care – PPO | Admitting: Physical Therapy

## 2019-11-22 ENCOUNTER — Inpatient Hospital Stay (HOSPITAL_COMMUNITY): Payer: BC Managed Care – PPO | Admitting: Occupational Therapy

## 2019-11-22 ENCOUNTER — Encounter (HOSPITAL_COMMUNITY): Payer: Self-pay | Admitting: Physical Medicine & Rehabilitation

## 2019-11-22 ENCOUNTER — Inpatient Hospital Stay (HOSPITAL_COMMUNITY): Payer: BC Managed Care – PPO | Admitting: Speech Pathology

## 2019-11-22 LAB — GLUCOSE, CAPILLARY
Glucose-Capillary: 101 mg/dL — ABNORMAL HIGH (ref 70–99)
Glucose-Capillary: 120 mg/dL — ABNORMAL HIGH (ref 70–99)
Glucose-Capillary: 177 mg/dL — ABNORMAL HIGH (ref 70–99)
Glucose-Capillary: 178 mg/dL — ABNORMAL HIGH (ref 70–99)
Glucose-Capillary: 183 mg/dL — ABNORMAL HIGH (ref 70–99)
Glucose-Capillary: 197 mg/dL — ABNORMAL HIGH (ref 70–99)

## 2019-11-22 MED ORDER — TRAZODONE HCL 50 MG PO TABS
50.0000 mg | ORAL_TABLET | Freq: Every evening | ORAL | Status: DC | PRN
  Administered 2019-11-22: 50 mg via ORAL
  Filled 2019-11-22: qty 1

## 2019-11-22 MED ORDER — TRAZODONE HCL 50 MG PO TABS
50.0000 mg | ORAL_TABLET | Freq: Every evening | ORAL | Status: DC | PRN
  Filled 2019-11-22 (×2): qty 1

## 2019-11-22 NOTE — Evaluation (Signed)
Occupational Therapy Assessment and Plan  Patient Details  Name: Corey Bennett MRN: [id number] Date of Birth: [birth year]  OT Diagnosis: acute pain, cognitive deficits, hemiplegia affecting dominant side and muscle weakness (generalized) Rehab Potential: Rehab Potential (ACUTE ONLY): Good ELOS: ~14-18 days   Today's Date: 11/22/2019 OT Individual Time: 4098-1191 OT Individual Time Calculation (min): 60 min     Hospital Problem: Active Problems:   Left middle cerebral artery stroke [facility])   Past Medical History:  Past Medical History:  Diagnosis Date  . Carotid artery occlusion   . Diabetes ([place])   . Hypercholesterolemia   . Hypertension   . 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 Stroke [facility])    Past Surgical History:  Past Surgical History:  Procedure Laterality Date  . ENDARTERECTOMY Left 09/08/2019   Procedure: LEFT CAROTID ENDARTERECTOMY;  Surgeon: Angelia Mould, MD;  Location: [facility] OR;  Service: Vascular;  Laterality: Left;  . IR GASTROSTOMY TUBE MOD SED  11/10/2019  . PATCH ANGIOPLASTY Left 09/08/2019   Procedure: PATCH ANGIOPLASTY USING XENOSURE BIOLOGIC PATCH 1x6cm;  Surgeon: Angelia Mould, MD;  Location: [facility] OR;  Service: Vascular;  Laterality: Left;  . TONSILLECTOMY      Assessment & Plan Clinical Impression: Patient is a 59 y.o. year old male right-handed male with history of diabetes mellitus, hypertension, hyperlipidemia, left MCA CVA4/2021 while living in [place] underwent rehabilitation at [facility] with residual aphasia and right-sided weakness as well as left CEA 09/2019 who resides presently at assisted living facility[facility]. Presented 10/10/2019 with seizure-like activity requiring intubation. CT/MRI showed large territory chronic hemorrhagic infarct in the left frontal lobe. Ill-defined hyperintensities throughout the left basal ganglia and thalamus. 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 Patient remained on aspirin Plavix for CVA prophylaxis. Keppra/Dilantin and Vimpat ordered for seizure disorder. Patient did initially require intubation extubated 10/14/2019 however continued to have desaturations into the 80s 15 L nonrebreather mask requiring reintubation and tracheostomy performed 10/22/2019 presently with a #6 Shiley cuffless trach as of 11/02/2019. Currently n.p.o. with gastrostomy tube placed 11/10/2019 per Dr. Earleen Newport of interventional radiology. Patient bouts of urinary retention maintained on UrecholinePatient transferred to CIR on 11/21/2019 .    Patient currently requires max with basic self-care skills and mod A for basic mobility secondary to muscle weakness and acute pain all over, decreased cardiorespiratoy endurance and decreased oxygen support, abnormal tone, unbalanced muscle activation and decreased coordination, decreased attention, decreased awareness, decreased problem solving, decreased safety awareness and decreased memory and decreased sitting balance, decreased standing balance, decreased postural control, hemiplegia, decreased balance strategies and decr functional communication/ language.  Prior to hospitalization, patient could complete ADL with min.  Patient will benefit from skilled intervention to decrease level of assist with basic self-care skills and increase independence with basic self-care skills prior to discharge home with care partner.  Anticipate patient will require 24 hour supervision and follow up home health.  OT - End of Session Activity Tolerance: Tolerates < 10 min activity with changes in vital signs Endurance Deficit: Yes OT Assessment Rehab Potential (ACUTE ONLY): Good OT Barriers to Discharge: Home environment access/layout;New oxygen;Trach (unclear about environment) OT Barriers to Discharge Comments: concerned about pt traveling out of  state (long distance) to get to d/c location OT Patient demonstrates impairments in the following area(s): Balance;Skin Integrity;Sensory;Cognition;Endurance;Motor;Nutrition;Pain;Perception;Safety OT Basic ADL's Functional Problem(s): Eating;Grooming;Bathing;Dressing;Toileting OT Transfers Functional Problem(s): Toilet;Tub/Shower  OT Additional Impairment(s): Fuctional Use of Upper Extremity OT Plan OT Intensity: Minimum of 1-2 x/day, 45 to 90 minutes OT Frequency: 5 out of 7 days OT Duration/Estimated Length of Stay: ~14-18 days OT Treatment/Interventions: Balance/vestibular training;Disease mangement/prevention;Neuromuscular re-education;Self Care/advanced ADL retraining;Cognitive remediation/compensation;DME/adaptive equipment instruction;Pain management;Skin care/wound managment;Community reintegration;Patient/family education;Splinting/orthotics;Functional electrical stimulation;Discharge planning;Functional mobility training;Psychosocial support;Therapeutic Activities;Visual/perceptual remediation/compensation;UE/LE Coordination activities;UE/LE Strength taining/ROM;Therapeutic Exercise;Wheelchair propulsion/positioning OT Self Feeding Anticipated Outcome(s): TBD OT Basic Self-Care Anticipated Outcome(s): supervision to min A OT Toileting Anticipated Outcome(s): min A OT Bathroom Transfers Anticipated Outcome(s): min A OT Recommendation Recommendations for Other Services: Therapeutic Recreation consult Patient destination: Home Follow Up Recommendations: 24 hour supervision/assistance;Home health OT Equipment Recommended: To be determined   OT Evaluation Precautions/Restrictions  Precautions Precautions: Fall Precaution Comments: trach, PEG, trach collar at 35% Restrictions Weight Bearing Restrictions: No General Chart Reviewed: Yes Family/Caregiver Present: No Vital Signs Therapy Vitals Pulse Rate: 83 Resp: 18 Patient Position (if appropriate): Lying Oxygen Therapy SpO2: 96  % O2 Device: Tracheostomy Collar O2 Flow Rate (L/min): 5 L/min FiO2 (%): 28 % Pain   Home Living/Prior Functioning Home Living Family/patient expects to be discharged to:: Unsure Available Help at Discharge: Available 24 hours/day (going to family's home in [place]) Type of Home: Assisted living Home Access:  (unsure about home environment in [place])  Lives With: Other (Comment) (alone at ALF) Vision Baseline Vision/History: Wears glasses Wears Glasses: At all times Vision Assessment?: No apparent visual deficits Additional Comments: can track in all quadrants- unclear whether just wears glasses for reading or all the time. Will continue to assess Perception  Perception: Impaired Inattention/Neglect: Does not attend to right side of body Praxis Praxis: Intact Cognition Overall Cognitive Status: Difficult to assess Arousal/Alertness: Awake/alert Orientation Level: Person;Nonverbal/unable to assess Year:  (unable to fully asess due to language and communication impairements) Month:  (unable to fully asess due to language and communication impairements) Day of Week:  (unable to fully asess due to language and communication impairements) Memory:  (unable to fully asess due to language and communication impairements) Immediate Memory Recall:  (unable to fully asess due to language and communication impairements) Memory Recall Sock:  (unable to fully asess due to language and communication impairements) Memory Recall Blue:  (unable to fully asess due to language and communication impairements) Memory Recall Bed:  (unable to fully asess due to language and communication impairements) Attention: Sustained Sustained Attention: Appears intact Awareness: Impaired Awareness Impairment: Emergent impairment Safety/Judgment: Impaired Sensation Sensation Light Touch: Impaired Detail Light Touch Impaired Details: Impaired RUE Proprioception: Impaired Detail Proprioception Impaired  Details: Impaired RUE Coordination Gross Motor Movements are Fluid and Coordinated: No Fine Motor Movements are Fluid and Coordinated: No Coordination and Movement Description: movement and coordination in right UE impaired Finger Nose Finger Test: unable to perform on the right Motor  Motor Motor: Hemiplegia;Abnormal tone;Abnormal postural alignment and control Motor - Skilled Clinical Observations: generalized weakness and reports pain all over  Trunk/Postural Assessment  Cervical Assessment Cervical Assessment:  (forward flexed) Thoracic Assessment Thoracic Assessment:  (forward flexed) Lumbar Assessment Lumbar Assessment:  (posterior pelvic tilt) Postural Control Postural Control: Deficits on evaluation Trunk Control: poors- falls to the right with decr awarness and requires cues for correction Righting Reactions: delayed Protective Responses: delayed  Balance Balance Balance Assessed: Yes Static Sitting Balance Static Sitting - Balance Support: Left upper extremity supported;Feet supported Static Sitting - Level of Assistance: 4: Min assist Dynamic Sitting Balance Dynamic Sitting - Balance Support: During functional activity Dynamic Sitting - Level of Assistance: 4:  Min Insurance risk surveyor Standing - Balance Support: During functional activity Static Standing - Level of Assistance: 4: Min assist;3: Mod assist Extremity/Trunk Assessment RUE Assessment RUE Assessment: Exceptions to [facility] RUE Body System: Neuro Brunstrum levels for arm and hand: Arm;Hand Brunstrum level for arm: Stage III Synergy is performed voluntarily Brunstrum level for hand: Stage II Synergy is developing RUE Tone RUE Tone: Modified Ashworth Body Part - Modified Ashworth Scale: Elbow;Wrist;Thumb Elbow - Modified Ashworth Scale for Grading Hypertonia RUE: More marked increase in muscle tone through most of the ROM, but affected part(s) easily moved Wrist - Modified Ashworth Scale for  Grading Hypertonia RUE: More marked increase in muscle tone through most of the ROM, but affected part(s) easily moved Thumb - Modified Ashworth Scale for Grading Hypertonia RUE: No increase in muscle tone RUE Tone Comments: has had previous stroke and impaired right side; more impaired now LUE Assessment LUE Assessment: Within Functional Limits  Care Tool Care Tool Self Care Eating Eating activity did not occur: Safety/medical concerns      Oral Care    Oral Care Assist Level: Dependent - Patient 0%)    Bathing   Body parts bathed by patient: Chest;Abdomen;Front perineal area;Face Body parts bathed by helper: Right arm;Left arm;Buttocks;Right upper leg;Left upper leg;Right lower leg;Left lower leg   Assist Level: Maximal Assistance - Patient 24 - 49%    Upper Body Dressing(including orthotics)   What is the patient wearing?: Pull over shirt   Assist Level: Maximal Assistance - Patient 25 - 49%    Lower Body Dressing (excluding footwear)   What is the patient wearing?: Pants;Incontinence brief Assist for lower body dressing: Total Assistance - Patient < 25%    Putting on/Taking off footwear   What is the patient wearing?: Non-skid slipper socks;Ted hose Assist for footwear: Total Assistance - Patient < 25%       Care Tool Toileting Toileting activity Toileting Activity did not occur (Clothing management and hygiene only): N/A (no void or bm) Assist for toileting:  (foley)     Care Tool Bed Mobility Roll left and right activity        Sit to lying activity   Sit to lying assist level: Minimal Assistance - Patient > 75%    Lying to sitting edge of bed activity   Lying to sitting edge of bed assist level: Minimal Assistance - Patient > 75%     Care Tool Transfers Sit to stand transfer   Sit to stand assist level: Moderate Assistance - Patient 50 - 74%    Chair/bed transfer   Chair/bed transfer assist level: Moderate Assistance - Patient 50 - 74%     Toilet  transfer Toilet transfer activity did not occur: Safety/medical concerns (no O2 trach adapter, fatigue  and no need)       Care Tool Cognition Expression of Ideas and Wants Expression of Ideas and Wants: Rarely/Never expressess or very difficult - rarely/never expresses self or speech is very difficult to understand   Understanding Verbal and Non-Verbal Content Understanding Verbal and Non-Verbal Content: Usually understands - understands most conversations, but misses some part/intent of message. Requires cues at times to understand   Memory/Recall Ability *first 3 days only      Refer to Care Plan for Long Term Goals  SHORT TERM GOAL WEEK 1 OT Short Term Goal 1 (Week 1): Pt will tolerate therapeutic activity for ~15 min in un supported sitting to improved cardiopulmonary endurance OT Short Term Goal 2 (  Week 1): Pt will perform sit to stands inADL tasks with contact guard OT Short Term Goal 3 (Week 1): Pt will don shirt with hemi dressing techniques with min A OT Short Term Goal 4 (Week 1): Pt will perform functional transfer (stand pivot with min A )  Recommendations for other services: Therapeutic Recreation  Stress management   Skilled Therapeutic Intervention 1:1 OT eval initiated with OT purpose,role and goals discussed with the pt. Pt with difficulty with expression of his thoughts though out session; sometimes the wrong word is use and/ or very diarthric. Pt expressed he had pain all over but not able to elaborate more. Noted pain with passive movement of his fingers. Pt came to EOB with min  To mod A. Initially with significant lean to the right without attempt to self correct with max multimodal cues pt able to self correct. Pt performed stand pivot transfer into w/c. Pt with PSMV donned in session. Pt engaged in therapeutic bath at the sink however with activity pt desat to 75% - PMSV doffed and returned to high 90s. Thought out session pt able to maintain O2 sats with or without  trach collar without PSMV donned during activity. Pt very fatigued and requires frequent rest breaks and expresses it is difficult to breathe. Rn nofitifed and came in and assessed but pt's vitals were okay BP 161/78, HR 86 and O2 on trach collar 98%. Pt required A with hemi dressing techniques and more help due to fatigue. PT able to perform stand pivot transfers with mod A.   ADL ADL Eating: NPO Upper Body Bathing: Moderate assistance Where Assessed-Upper Body Bathing: Sitting at sink Lower Body Bathing: Maximal assistance Where Assessed-Lower Body Bathing: Sitting at sink Upper Body Dressing: Maximal assistance Where Assessed-Upper Body Dressing: Sitting at sink Lower Body Dressing:  (total A) Where Assessed-Lower Body Dressing: Sitting at sink Toileting: Not assessed (foley) Mobility  Transfers Sit to Stand: Minimal Assistance - Patient > 75% Stand to Sit: Minimal Assistance - Patient > 75%   Discharge Criteria: Patient will be discharged from OT if patient refuses treatment 3 consecutive times without medical reason, if treatment goals not met, if there is a change in medical status, if patient makes no progress towards goals or if patient is discharged from hospital.  The above assessment, treatment plan, treatment alternatives and goals were discussed and mutually agreed upon: by patient  Nicoletta Ba 11/22/2019, 11:24 AM

## 2019-11-22 NOTE — Evaluation (Signed)
Physical Therapy Assessment and Plan  Patient Details  Name: Corey Bennett MRN: [id number] Date of Birth: 12-26-1960  PT Diagnosis: Abnormal posture, Abnormality of gait, Cognitive deficits, Difficulty walking, Hemiplegia dominant, Impaired cognition and Muscle weakness Rehab Potential: Fair ELOS: 18-21 days   Today's Date: 11/22/2019 PT Individual Time: 1415-1530 PT Individual Time Calculation (min): 75 min    Hospital Problem: Active Problems:   Left middle cerebral artery stroke [facility])   Past Medical History:  Past Medical History:  Diagnosis Date   Carotid artery occlusion    Diabetes ([place])    Hypercholesterolemia    Hypertension    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   Stroke [facility])    Past Surgical History:  Past Surgical History:  Procedure Laterality Date   ENDARTERECTOMY Left 09/08/2019   Procedure: LEFT CAROTID ENDARTERECTOMY;  Surgeon: Angelia Mould, MD;  Location: [facility] OR;  Service: Vascular;  Laterality: Left;   IR GASTROSTOMY TUBE MOD SED  11/10/2019   PATCH ANGIOPLASTY Left 09/08/2019   Procedure: PATCH ANGIOPLASTY USING XENOSURE BIOLOGIC PATCH 1x6cm;  Surgeon: Angelia Mould, MD;  Location: [facility] OR;  Service: Vascular;  Laterality: Left;   TONSILLECTOMY      Assessment & Plan Clinical Impression:  Corey Bennett is a 59 year old right-handed male with history of diabetes mellitus, hypertension, hyperlipidemia, left MCA CVA4/2021 while living in [place] underwent rehabilitation at [facility] with residual aphasia and right-sided weakness as well as left CEA 09/2019 who resides presently at assisted living facility/carriage house. Presented 10/10/2019 with seizure-like activity requiring intubation. CT/MRI showed large territory chronic hemorrhagic infarct in the left frontal lobe. Ill-defined hyperintensities throughout the left basal ganglia and thalamus. 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 Patient remained on aspirin Plavix for CVA prophylaxis. Keppra/Dilantin and Vimpat ordered for seizure disorder. Patient did initially require intubation extubated 10/14/2019 however continued to have desaturations into the 80s 15 L nonrebreather mask requiring reintubation and tracheostomy performed 10/22/2019 presently with a #6 Shiley cuffless trach as of 11/02/2019. Currently n.p.o. with gastrostomy tube placed 11/10/2019 per Dr. Earleen Newport of interventional radiology. Patient bouts of urinary retention maintained on Urecholine. Therapy evaluations completed and patient was admitted for a comprehensive rehab program. Patient transferred to CIR on 11/21/2019 .   Patient currently requires mod with mobility secondary to muscle weakness, decreased cardiorespiratoy endurance and decreased oxygen support, abnormal tone, unbalanced muscle activation, decreased coordination and decreased motor planning, decreased attention to right, decreased initiation, decreased attention, decreased awareness, decreased problem solving, decreased safety awareness, decreased memory and delayed processing and decreased sitting balance, decreased standing balance, decreased postural control, hemiplegia and decreased balance strategies.  Prior to hospitalization, patient was independent  with mobility and lived with Other (Comment) (ALF) in a Assisted living home.  Home access is  Level entry.  Patient will benefit from skilled PT intervention to maximize safe functional mobility, minimize fall risk and decrease caregiver burden for planned discharge home with 24 hour assist.  Anticipate patient will benefit from follow up [facility] at discharge.  PT - End of Session Activity Tolerance: Tolerates 10 - 20 min activity with multiple rests Endurance Deficit: Yes Endurance Deficit Description: frequent rest breaks during session, poor OOB  tolerance PT Assessment Rehab Potential (ACUTE/IP ONLY): Fair PT Barriers to Discharge: Trach;Nutrition means;New oxygen PT Patient demonstrates impairments in the following area(s): Balance;Endurance;Motor;Pain;Perception;Safety;Sensory PT Transfers Functional Problem(s): Bed Mobility;Bed to Chair;Car;Furniture;Floor  PT Locomotion Functional Problem(s): Ambulation;Wheelchair Mobility;Stairs PT Plan PT Intensity: Minimum of 1-2 x/day ,45 to 90 minutes PT Frequency: 5 out of 7 days PT Duration Estimated Length of Stay: 18-21 days PT Treatment/Interventions: Ambulation/gait training;Balance/vestibular training;Cognitive remediation/compensation;Community reintegration;Discharge planning;Disease management/prevention;DME/adaptive equipment instruction;Functional electrical stimulation;Functional mobility training;Neuromuscular re-education;Pain management;Patient/family education;Psychosocial support;Splinting/orthotics;Therapeutic Activities;Therapeutic Exercise;UE/LE Strength taining/ROM;UE/LE Coordination activities;Visual/perceptual remediation/compensation;Wheelchair propulsion/positioning PT Transfers Anticipated Outcome(s): Supervision PT Locomotion Anticipated Outcome(s): Supervision with LRAD PT Recommendation Follow Up Recommendations: Home health PT;24 hour supervision/assistance Patient destination: Assisted Living Equipment Recommended: To be determined Equipment Details: TBD pending progress   PT Evaluation Precautions/Restrictions Precautions Precautions: Fall Precaution Comments: trach, PEG, trach collar at 35% Restrictions Weight Bearing Restrictions: No Pain Pain Assessment Pain Scale: 0-10 Pain Score: 0-No pain Home Living/Prior Functioning Home Living Available Help at Discharge: Available 24 hours/day Type of Home: Assisted living Home Access: Level entry Home Layout: One level Additional Comments: resided in ALF; per brother plan to d/c back to ALF  Lives  With: Other (Comment) (ALF) Prior Function Level of Independence: Independent with gait;Independent with transfers Vision/Perception  Perception Perception: Impaired Inattention/Neglect: Does not attend to right side of body Praxis Praxis: Intact  Cognition Overall Cognitive Status: Impaired/Different from baseline Arousal/Alertness: Awake/alert Orientation Level: Oriented to person;Oriented to place Attention: Sustained Sustained Attention: Appears intact Memory:  (unable to fully assess d/t language and communication impair) Awareness: Impaired Awareness Impairment: Emergent impairment Behaviors: Poor frustration tolerance Safety/Judgment: Impaired Sensation Sensation Light Touch: Impaired Detail Light Touch Impaired Details: Impaired RUE;Impaired RLE (N/T at baseline per patient) Proprioception: Impaired Detail Proprioception Impaired Details: Impaired RUE;Impaired RLE Coordination Gross Motor Movements are Fluid and Coordinated: No Fine Motor Movements are Fluid and Coordinated: No Coordination and Movement Description: R hemi Motor  Motor Motor: Hemiplegia;Abnormal tone;Abnormal postural alignment and control Motor - Skilled Clinical Observations: R hemi; generalized weakness  Trunk/Postural Assessment  Cervical Assessment Cervical Assessment: Exceptions to [facility] (forward head) Thoracic Assessment Thoracic Assessment: Exceptions to [facility] (kyphotic) Lumbar Assessment Lumbar Assessment: Exceptions to [facility] (posterior pelvic tilt) Postural Control Postural Control: Deficits on evaluation Trunk Control: poors- falls to the right with decr awarness and requires cues for correction Righting Reactions: delayed Protective Responses: delayed  Balance Balance Balance Assessed: Yes Static Sitting Balance Static Sitting - Balance Support: No upper extremity supported;Feet supported Static Sitting - Level of Assistance: 4: Min assist Dynamic Sitting Balance Dynamic Sitting -  Balance Support: No upper extremity supported;Feet supported;During functional activity Dynamic Sitting - Level of Assistance: 4: Min Insurance risk surveyor Standing - Balance Support: Bilateral upper extremity supported;During functional activity Static Standing - Level of Assistance: 4: Min assist Dynamic Standing Balance Dynamic Standing - Balance Support: Bilateral upper extremity supported;During functional activity Dynamic Standing - Level of Assistance: 3: Mod assist Extremity Assessment   RLE Assessment RLE Assessment: Exceptions to [facility] General Strength Comments: 3 to 4/5 grossly LLE Assessment LLE Assessment: Within Functional Limits General Strength Comments: 4 to 5/5 grossly  Care Tool Care Tool Bed Mobility Roll left and right activity   Roll left and right assist level: Minimal Assistance - Patient > 75%    Sit to lying activity   Sit to lying assist level: Minimal Assistance - Patient > 75%    Lying to sitting edge of bed activity   Lying to sitting edge of bed assist level: Minimal Assistance - Patient > 75%     Care Tool Transfers Sit to stand transfer   Sit to stand assist level: Moderate Assistance - Patient 50 -  74%    Chair/bed transfer   Chair/bed transfer assist level: Moderate Assistance - Patient 50 - 74%     Psychologist, counselling transfer activity did not occur: Safety/medical concerns        Care Tool Locomotion Ambulation Ambulation activity did not occur: Safety/medical concerns        Walk 10 feet activity Walk 10 feet activity did not occur: Safety/medical concerns       Walk 50 feet with 2 turns activity Walk 50 feet with 2 turns activity did not occur: Safety/medical concerns      Walk 150 feet activity Walk 150 feet activity did not occur: Safety/medical concerns      Walk 10 feet on uneven surfaces activity Walk 10 feet on uneven surfaces activity did not occur: Safety/medical concerns       Stairs Stair activity did not occur: Safety/medical concerns        Walk up/down 1 step activity Walk up/down 1 step or curb (drop down) activity did not occur: Safety/medical concerns     Walk up/down 4 steps activity did not occuR: Safety/medical concerns  Walk up/down 4 steps activity      Walk up/down 12 steps activity Walk up/down 12 steps activity did not occur: Safety/medical concerns      Pick up small objects from floor Pick up small object from the floor (from standing position) activity did not occur: Safety/medical concerns      Wheelchair Will patient use wheelchair at discharge?: Yes Type of Wheelchair: Manual Wheelchair activity did not occur: Safety/medical concerns      Wheel 50 feet with 2 turns activity Wheelchair 50 feet with 2 turns activity did not occur: Safety/medical concerns    Wheel 150 feet activity Wheelchair 150 feet activity did not occur: Safety/medical concerns      Refer to Care Plan for Long Term Goals  SHORT TERM GOAL WEEK 1 PT Short Term Goal 1 (Week 1): Pt will perform least restrictive transfer with min A consistently PT Short Term Goal 2 (Week 1): Pt will initiate gait training PT Short Term Goal 3 (Week 1): Pt will tolerate sitting out of bed x 1 hour  Recommendations for other services: None   Skilled Therapeutic Intervention Evaluation completed (see details above and below) with education on PT POC and goals and individual treatment initiated with focus on setting pt up with appropriate equipment to be used during rehab stay, assessment of functional mobility, and orientation to rehab unit and schedule. Pt received seated in bed, agreeable to PT eval. Pt on continuous feeding via PEG tube, paused during therapy session. No complaints of pain. Bed mobility with min A. Sit to stand with min to mod A to RW. Pt exhibits posterior lean in standing and requires mod A to maintain standing balance. Stand pivot transfer bed to TIS w/c.  Adjusted w/c back for improved pt fit and comfort. Demonstrated tilt feature of chair. Pt declines to spend any more time spent sitting in chair out of bed. Assisted pt back to bed. Sit to supine min A. Pt left semi-reclined in bed while R hand splint obtained. Pt then agreeable to attempt OOB mobility again. Placed R hand splint on RW as pt exhibits decreased grip with R hand and decreased awareness of RUE. Pt exhibits improved safety with use of RW with R hand splint. Pt able to transfer again to TIS w/c in similar manner as above with  assist still needed for RW management and cues for stepping with RLE. Pt initially on 5L O2 via trach collar, on room air throughout session and SpO2 remains at 99% and higher though pt reports feeling SOA at times. Pt returned to 5L O2 via trach collar at end of session. Pt tolerates sitting up x 15 min at most then requests to return to bed. Pt's brother [name] present during session and able to assist with providing background information and PLOF. Pt's brother with questions regarding rehab schedule, therapy team, social work, Social research officer, government. Oriented pt and his brother to rehab unit regarding schedule, therapists, team conference, when [place] will be in contact, etc. Pt exhibits significant endurance deficits as well as impaired control and awareness of R hemibody and will benefit from skilled therapy services at CIR level. Pt left semi-reclined in bed with HOB elevated to 30 degrees, needs in reach, bed alarm in place, feeding tube restarted, brother present at end of session.  Mobility Bed Mobility Bed Mobility: Rolling Right;Rolling Left;Supine to Sit;Sit to Supine Rolling Right: Minimal Assistance - Patient > 75% Rolling Left: Minimal Assistance - Patient > 75% Supine to Sit: Minimal Assistance - Patient > 75% Sit to Supine: Minimal Assistance - Patient > 75% Transfers Transfers: Sit to Stand;Stand to Sit;Stand Pivot Transfers Sit to Stand: Moderate Assistance - Patient  50-74% Stand to Sit: Moderate Assistance - Patient 50-74% Stand Pivot Transfers: Moderate Assistance - Patient 50 - 74% Stand Pivot Transfer Details: Tactile cues for initiation;Tactile cues for sequencing;Tactile cues for placement;Verbal cues for sequencing;Verbal cues for technique;Verbal cues for precautions/safety;Verbal cues for safe use of DME/AE;Manual facilitation for weight shifting;Manual facilitation for placement Transfer (Assistive device): Rolling walker Locomotion  Stairs / Additional Locomotion Stairs: No Wheelchair Mobility Wheelchair Mobility: No   Discharge Criteria: Patient will be discharged from PT if patient refuses treatment 3 consecutive times without medical reason, if treatment goals not met, if there is a change in medical status, if patient makes no progress towards goals or if patient is discharged from hospital.  The above assessment, treatment plan, treatment alternatives and goals were discussed and mutually agreed upon: by patient and by family   Excell Seltzer, PT, DPT 11/22/2019, 5:09 PM

## 2019-11-22 NOTE — Progress Notes (Signed)
[place] PHYSICAL MEDICINE & REHABILITATION PROGRESS NOTE   Subjective/Complaints:    Objective:   No results found. Recent Labs    11/20/19 0137  WBC 8.2  HGB 8.9*  HCT 27.7*  PLT 328   Recent Labs    11/20/19 0137  NA 138  K 3.5  CL 102  CO2 27  GLUCOSE 105*  BUN 25*  CREATININE 1.12  CALCIUM 8.7*    Intake/Output Summary (Last 24 hours) at 11/22/2019 1132 Last data filed at 11/21/2019 2335 Gross per 24 hour  Intake --  Output 825 ml  Net -825 ml        Physical Exam: Vital Signs Blood pressure (!) 149/59, pulse 83, temperature 98.9 F (37.2 C), resp. rate 18, height 6\' 1"  (1.854 m), weight 75.4 kg, SpO2 96 %.   General: No acute distress Mood and affect are appropriate Heart: Regular rate and rhythm no rubs murmurs or extra sounds Lungs: Clear to auscultation, breathing unlabored, no rales or wheezes Abdomen: Positive bowel sounds, soft nontender to palpation, nondistended Extremities: No clubbing, cyanosis, or edema Skin: No evidence of breakdown, no evidence of rash Neurologic: Cranial nerves II through XII intact, motor strength is 5/5 inleft deltoid, bicep, tricep, grip, hip flexor, knee extensors, ankle dorsiflexor and plantar flexor 3- RIght delt bi, tri, grip, 4/5 Right HF, KE, ADF   Musculoskeletal: Full range of motion in all 4 extremities. No joint swelling  Assessment/Plan: 1. 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  Physiatrist is providing close team supervision and 24 hour management of active medical problems listed below.  Physiatrist and rehab team continue to assess barriers to discharge/monitor patient progress toward functional and medical goals  Care Tool:  Bathing    Body parts bathed by patient: Chest, Abdomen, Front perineal area, Face   Body parts bathed by helper: Right arm, Left arm, Buttocks, Right  upper leg, Left upper leg, Right lower leg, Left lower leg     Bathing assist Assist Level: Maximal Assistance - Patient 24 - 49%     Upper Body Dressing/Undressing Upper body dressing   What is the patient wearing?: Pull over shirt    Upper body assist Assist Level: Maximal Assistance - Patient 25 - 49%    Lower Body Dressing/Undressing Lower body dressing      What is the patient wearing?: Pants, Incontinence brief     Lower body assist Assist for lower body dressing: Total Assistance - Patient < 25%     Toileting Toileting Toileting Activity did not occur and hygiene only): N/A (no void or bm)  Toileting assist Assist for toileting:  (foley)     Transfers Chair/bed transfer  Transfers assist     Chair/bed transfer assist level: Moderate Assistance - Patient 50 - 74%     Locomotion Ambulation   Ambulation assist              Walk 10 feet activity   Assist           Walk 50 feet activity   Assist           Walk 150 feet activity   Assist           Walk 10 feet on uneven surface  activity   Assist           Wheelchair     Assist  Wheelchair 50 feet with 2 turns activity    Assist            Wheelchair 150 feet activity     Assist          Blood pressure (!) 149/59, pulse 83, temperature 98.9 F (37.2 C), resp. rate 18, height 6\' 1"  (1.854 m), weight 75.4 kg, SpO2 96 %.    Medical Problem List and Plan: 1.Right hemiparesis with severe aphasia/dysphagiasecondary to left MCA distribution infarct as well as history of left MCA infarct -patient may not shower until decannulated -ELOS/Goals: 21-24d 2. Antithrombotics: -DVT/anticoagulation:SCDs -antiplatelet therapy: Aspirin 81 mg daily and Plavix 75 mg daily 3. Pain Management:Tylenol as needed 4. Mood:Provide emotional support -antipsychotic agents:  Seroquel 50 mg nightly 5. Neuropsych: This patientis notcapable of making decisions on hisown behalf. 6. Skin/Wound Care:Routine skin checks 7. Fluids/Electrolytes/Nutrition:Routine in and outs with follow-up chemistries 8. Seizure disorder. Dilantin 175 mg 3 times daily, Keppra 1000 mg twice daily, Vimpat 150 mg twice daily. Follow-up neurology services 9. Diabetes mellitus. Hemoglobin A1c 7.2. Lantus insulin 6 units nightly, NovoLog 2 units every 4 hours. CBG (last 3)  Recent Labs    11/21/19 2325 11/22/19 0351 11/22/19 0758  GLUCAP 133* 101* 120*  well controlled 9/19  10. Dysphagia. NPO. Status post gastrostomy tube 11/10/2019 per interventional radiology. Follow-up speech therapy 11. Hypertension. Hydralazine 50 mg every 8 hours, Cardura 1 mg daily, Norvasc 10 mg daily. Monitor with increased mobility Vitals:   11/22/19 0805 11/22/19 1122  BP:    Pulse: 87 83  Resp: 18 18  Temp:    SpO2: 96% 96%    12. Acute on chronic respiratory failure. Status post tracheostomy tube. Presently with a #6 Shiley flexible cuffless trach as of 11/02/2019 followed by pulmonary services Dr. 11/04/2019 13. Hyperlipidemia. Lipitor 14. Urinary retention. Urecholine 50 mg 3 times daily. Check PVR  LOS: 1 days A FACE TO FACE EVALUATION WAS PERFORMED  Corey Bennett 11/22/2019, 11:32 AM

## 2019-11-22 NOTE — Progress Notes (Signed)
Physical Therapy Note  Patient Details  Name: Wood Novacek MRN: [id number] Date of Birth: 1960/08/24 Today's Date: 11/22/2019    Attempted to see patient for second scheduled PM session. Pt declines any participation at this time, just states "no". Pt missed 30 min of scheduled therapy session due to refusal. Will follow up per POC.    Peter Congo, PT, DPT  11/22/2019, 5:20 PM

## 2019-11-22 NOTE — Evaluation (Signed)
Speech Language Pathology Assessment and Plan  Patient Details  Name: Corey Bennett MRN: [id number] Date of Birth: 1960/06/22  SLP Diagnosis: Aphasia;Apraxia;Dysphagia  Rehab Potential: Good ELOS: 14-18 days    Today's Date: 11/22/2019 SLP Individual Time: 1305-1400 SLP Individual Time Calculation (min): 55 min   Hospital Problem: Active Problems:   Left middle cerebral artery stroke [facility])  Past Medical History:  Past Medical History:  Diagnosis Date  . Carotid artery occlusion   . Diabetes ([place])   . Hypercholesterolemia   . Hypertension   . 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 Stroke [facility])    Past Surgical History:  Past Surgical History:  Procedure Laterality Date  . ENDARTERECTOMY Left 09/08/2019   Procedure: LEFT CAROTID ENDARTERECTOMY;  Surgeon: Angelia Mould, MD;  Location: [facility] OR;  Service: Vascular;  Laterality: Left;  . IR GASTROSTOMY TUBE MOD SED  11/10/2019  . PATCH ANGIOPLASTY Left 09/08/2019   Procedure: PATCH ANGIOPLASTY USING XENOSURE BIOLOGIC PATCH 1x6cm;  Surgeon: Angelia Mould, MD;  Location: [facility] OR;  Service: Vascular;  Laterality: Left;  . TONSILLECTOMY      Assessment / Plan / Recommendation Clinical Impression   Corey Bennett is a 59 year old right-handed male with history of diabetes mellitus, hypertension, hyperlipidemia, left MCA CVA4/2021 while living in [place] underwent rehabilitation at [facility] with residual aphasia and right-sided weakness as well as left CEA 09/2019 who resides presently at assisted living facility[facility]. Presented 10/10/2019 with seizure-like activity requiring intubation. CT/MRI showed large territory chronic hemorrhagic infarct in the left frontal lobe. Ill-defined hyperintensities throughout the left basal ganglia and thalamus. 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 Patient remained on aspirin Plavix for CVA prophylaxis. Keppra/Dilantin and Vimpat ordered for seizure disorder. Patient did initially require intubation extubated 10/14/2019 however continued to have desaturations into the 80s 15 L nonrebreather mask requiring reintubation and tracheostomy performed 10/22/2019 presently with a #6 Shiley cuffless trach as of 11/02/2019. Currently n.p.o. with gastrostomy tube placed 11/10/2019 per Dr. Earleen Newport of interventional radiology. Patient bouts of urinary retention maintained on Urecholine. Therapy evaluations completed and patient was admitted for a comprehensive rehab program.  SLP evaluation completed on 11/22/2019 with results as follows:  Passy Muir Speaking Valve Evaluation: Pt presents with good toleration of Passy Muir speaking valve without changes in vital signs or complaints of discomfort over 50+ minutes.  Pt's vocal quality was intermittently wet throughout therapy session due to decreased management of secretions and vocal intensity was decreased due to debility following prolonged intubation.  Pt was not wearing valve upon therapist's arrival and requested to remove valve as therapist was leaving.  Therapist encouraged pt to wear valve as much as possible in order to improve cough strength and swallowing function.  Pt indicated understanding by nodding in agreement with therapist.    Cognitive-linguistic Evaluation : Pt also presents with a moderately severe expressive aphasia further exacerbated by apraxia.  Pt can answer basic to semi-complex yes/no questions with min assist but struggles to carry out 2 step commands due to motor planning deficits.  He seems to have decreased awareness of motor errors with no attempts but has more awareness of verbal errors as evidenced by increased frustration and attempts to correct when challenged during confrontational naming tasks.  Pt can repeat at the word level but breakdown occurs with short  phrases.  Pt's speech intelligibility is currently reduced  in the context of decreased vocal intensity and imprecise articulation of consonants.  Currently pt requires max assist for functional communication at this time and will likely need a multimodal communication system for long term needs given that he was using a speech generating device at baseline.    Bedside Swallow Evaluation: Prior to PO trials, pt presented with intermittently wet vocal quality as mentioned above which did not appear to change in frequency or intensity with intake.  Pt consumed nectar thick liquids and thin liquids via teaspoon, exhibiting decreased lingual coordination and manipulation of boluses resulting in prolonged oral phase and suspected delay in swallow initiation.  No overt s/s of aspiration were evident with thin or thickened liquids.  Recommend that pt continue trials of thin and nectar thick liquids with SLP to continue working towards repeat instrumental assessment and diet advancement.  Given the abovementioned deficits, pt would benefit from skilled ST while inpatient in order to maximize functional independence and reduce burden of care prior to discharge.  Anticipate that pt will need 24/7 supervision at discharge in addition to [place] follow up at next level of care.      Skilled Therapeutic Interventions          Cognitive-linguistic, bedside swallow, and PMSV evaluation completed with results and recommendations reviewed with patient.     SLP Assessment  Patient will need skilled Speech Lanaguage Pathology Services during CIR admission    Recommendations  Patient may use Passy-Muir Speech Valve: During all waking hours (remove during sleep);During PO intake/meals PMSV Supervision: Intermittent SLP Diet Recommendations: NPO;Nectar Liquid Administration via: Spoon Medication Administration: Via alternative means Supervision: Staff to assist with self feeding;Full supervision/cueing for compensatory  strategies Compensations: Clear throat intermittently Oral Care Recommendations: Oral care BID Patient destination: Home (family's home in [place]) Follow up Recommendations: Home Health SLP;24 hour supervision/assistance Equipment Recommended: To be determined    SLP Frequency 3 to 5 out of 7 days   SLP Duration  SLP Intensity  SLP Treatment/Interventions 14-18 days  Minumum of 1-2 x/day, 30 to 90 minutes  Cueing hierarchy;Dysphagia/aspiration precaution training;Environmental controls;Patient/family education;Speech/Language facilitation;Internal/external aids;Multimodal communication approach    Pain Pain Assessment Pain Scale: 0-10 Pain Score: 0-No pain  Prior Functioning Cognitive/Linguistic Baseline: Baseline deficits Baseline deficit details: baseline aphasia and 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 4/21 Type of Home: Assisted living  Lives With: Other (Comment) (ALF) Available Help at Discharge: Available 24 hours/day  SLP Evaluation Cognition Overall Cognitive Status: Impaired/Different from baseline Arousal/Alertness: Awake/alert Orientation Level: Oriented to person;Oriented to place Attention: Sustained Sustained Attention: Appears intact Awareness: Impaired Awareness Impairment: Emergent impairment Behaviors: Poor frustration tolerance Safety/Judgment: Impaired  Comprehension Auditory Comprehension Overall Auditory Comprehension: Impaired Yes/No Questions: Impaired Basic Biographical Questions: 26-50% accurate Basic Immediate Environment Questions: 50-74% accurate Commands: Impaired One Step Basic Commands: 25-49% accurate Expression Expression Primary Mode of Expression: Verbal Verbal Expression Overall Verbal Expression: Impaired Initiation: Impaired Level of Generative/Spontaneous Verbalization: Word Repetition: Impaired Level of Impairment: Phrase level Naming: Impairment Confrontation: Impaired Non-Verbal Means of Communication: Gestures Oral Motor Oral  Motor/Sensory Function Overall Oral Motor/Sensory Function: Severe impairment Facial ROM: Reduced right;Suspected CN VII (facial) dysfunction Facial Symmetry: Abnormal symmetry right;Suspected CN VII (facial) dysfunction Facial Strength: Reduced right;Suspected CN VII (facial) dysfunction Lingual ROM: Reduced right;Reduced left;Suspected CN XII (hypoglossal) dysfunction Lingual Symmetry: Abnormal symmetry right;Suspected CN XII (hypoglossal) dysfunction Lingual Strength: Reduced;Suspected CN XII (hypoglossal) dysfunction Motor Speech Overall Motor Speech: Impaired Intelligibility: Intelligibility reduced Word: 75-100% accurate Phrase: 0-24% accurate Motor Planning: Impaired Level of Impairment:  Phrase  Care Tool Care Tool Cognition Expression of Ideas and Wants Expression of Ideas and Wants: Rarely/Never expressess or very difficult - rarely/never expresses self or speech is very difficult to understand   Understanding Verbal and Non-Verbal Content Understanding Verbal and Non-Verbal Content: Sometimes understands - understands only basic conversations or simple, direct phrases. Frequently requires cues to understand   Memory/Recall Ability *first 3 days only Memory/Recall Ability *first 3 days only: That he or she is in a hospital/hospital unit     PMSV Assessment  PMSV Trial PMSV was placed for: 50 Able to redirect subglottic air through upper airway: Yes Able to Attain Phonation: Yes Voice Quality: Normal Able to Expectorate Secretions: No attempts Level of Secretion Expectoration with PMSV: Not observed Breath Support for Phonation: Mildly decreased Intelligibility: Intelligibility reduced Word: 75-100% accurate Phrase: 0-24% accurate Respirations During Trial:  (WFL) SpO2 During Trial: 98 % Pulse During Trial:  (80s) Behavior: Alert;Cooperative  Bedside Swallowing Assessment General Previous Swallow Assessment: MBS 8/31 Diet Prior to this Study: NPO;PEG  tube;Nectar-thick liquids Temperature Spikes Noted: No Respiratory Status: Trach Trach Size and Type: #6;Uncuffed;With PMSV in place History of Recent Intubation: Yes Length of Intubations (days): 7 days Date extubated:  (trach 8/19) Behavior/Cognition: Alert;Cooperative Oral Cavity - Dentition: Poor condition;Missing dentition Self-Feeding Abilities: Total assist Vision: Functional for self-feeding Patient Positioning: Upright in bed Baseline Vocal Quality: Normal;Low vocal intensity Volitional Cough: Cognitively unable to elicit Volitional Swallow: Able to elicit  Oral Care Assessment   Ice Chips   Thin Liquid Thin Liquid: Impaired Presentation: Spoon Oral Phase Impairments: Reduced lingual movement/coordination Oral Phase Functional Implications: Prolonged oral transit Pharyngeal  Phase Impairments: Suspected delayed Swallow Nectar Thick Nectar Thick Liquid: Impaired Presentation: Spoon Oral Phase Impairments: Reduced lingual movement/coordination Oral phase functional implications: Prolonged oral transit Pharyngeal Phase Impairments: Multiple swallows Honey Thick   Puree   Solid   BSE Assessment Risk for Aspiration Impact on safety and function: Severe aspiration risk Other Related Risk Factors: Previous CVA;Decreased management of secretions;Deconditioning  Short Term Goals: Week 1: SLP Short Term Goal 1 (Week 1): Pt will consume therapeutic trials of thin liquids via teaspoon with minimal overt s/s of aspiration and mod assist verbal cues for use of swallowing precautions. SLP Short Term Goal 2 (Week 1): Pt will use multimodal means of communication (picture exchange system, gestures, verbalization) to convey immediate, basic needs and wants with mod assist multimodal cues. SLP Short Term Goal 3 (Week 1): Pt will repeat 2-3 word functional phrases with max assist multimodal cues to recognize and correct verbal errors. SLP Short Term Goal 4 (Week 1): Pt will wear  speaking valve during all waking hours without reports of changes in vital signs or discomfort with mod I.  Refer to Care Plan for Long Term Goals  Recommendations for other services: None   Discharge Criteria: Patient will be discharged from SLP if patient refuses treatment 3 consecutive times without medical reason, if treatment goals not met, if there is a change in medical status, if patient makes no progress towards goals or if patient is discharged from hospital.  The above assessment, treatment plan, treatment alternatives and goals were discussed and mutually agreed upon: by patient  Emilio Math 11/22/2019, 4:20 PM

## 2019-11-23 ENCOUNTER — Inpatient Hospital Stay (HOSPITAL_COMMUNITY): Payer: BC Managed Care – PPO | Admitting: Occupational Therapy

## 2019-11-23 ENCOUNTER — Inpatient Hospital Stay (HOSPITAL_COMMUNITY): Payer: BC Managed Care – PPO

## 2019-11-23 ENCOUNTER — Inpatient Hospital Stay (HOSPITAL_COMMUNITY): Payer: BC Managed Care – PPO | Admitting: Speech Pathology

## 2019-11-23 DIAGNOSIS — I69391 Dysphagia following cerebral infarction: Secondary | ICD-10-CM

## 2019-11-23 DIAGNOSIS — R339 Retention of urine, unspecified: Secondary | ICD-10-CM

## 2019-11-23 DIAGNOSIS — Z930 Tracheostomy status: Secondary | ICD-10-CM

## 2019-11-23 LAB — COMPREHENSIVE METABOLIC PANEL
ALT: 42 U/L (ref 0–44)
AST: 38 U/L (ref 15–41)
Albumin: 2.9 g/dL — ABNORMAL LOW (ref 3.5–5.0)
Alkaline Phosphatase: 164 U/L — ABNORMAL HIGH (ref 38–126)
Anion gap: 10 (ref 5–15)
BUN: 26 mg/dL — ABNORMAL HIGH (ref 6–20)
CO2: 26 mmol/L (ref 22–32)
Calcium: 8.8 mg/dL — ABNORMAL LOW (ref 8.9–10.3)
Chloride: 101 mmol/L (ref 98–111)
Creatinine, Ser: 1.23 mg/dL (ref 0.61–1.24)
GFR calc Af Amer: >60 mL/min (ref >60)
GFR calc non Af Amer: >60 mL/min (ref >60)
Glucose, Bld: 163 mg/dL — ABNORMAL HIGH (ref 70–99)
Potassium: 3.7 mmol/L (ref 3.5–5.1)
Sodium: 137 mmol/L (ref 135–145)
Total Bilirubin: 0.2 mg/dL — ABNORMAL LOW (ref 0.3–1.2)
Total Protein: 6.8 g/dL (ref 6.5–8.1)

## 2019-11-23 LAB — CBC WITH DIFFERENTIAL/PLATELET
Abs Immature Granulocytes: 0.02 10*3/uL (ref 0.00–0.07)
Basophils Absolute: 0.1 10*3/uL (ref 0.0–0.1)
Basophils Relative: 1 %
Eosinophils Absolute: 0.4 10*3/uL (ref 0.0–0.5)
Eosinophils Relative: 6 %
HCT: 27.6 % — ABNORMAL LOW (ref 39.0–52.0)
Hemoglobin: 9.1 g/dL — ABNORMAL LOW (ref 13.0–17.0)
Immature Granulocytes: 0 %
Lymphocytes Relative: 20 %
Lymphs Abs: 1.3 10*3/uL (ref 0.7–4.0)
MCH: 30.5 pg (ref 26.0–34.0)
MCHC: 33 g/dL (ref 30.0–36.0)
MCV: 92.6 fL (ref 80.0–100.0)
Monocytes Absolute: 0.5 10*3/uL (ref 0.1–1.0)
Monocytes Relative: 7 %
Neutro Abs: 4.4 10*3/uL (ref 1.7–7.7)
Neutrophils Relative %: 66 %
Platelets: 303 10*3/uL (ref 150–400)
RBC: 2.98 MIL/uL — ABNORMAL LOW (ref 4.22–5.81)
RDW: 13.8 % (ref 11.5–15.5)
WBC: 6.7 10*3/uL (ref 4.0–10.5)
nRBC: 0 % (ref 0.0–0.2)

## 2019-11-23 LAB — GLUCOSE, CAPILLARY
Glucose-Capillary: 132 mg/dL — ABNORMAL HIGH (ref 70–99)
Glucose-Capillary: 152 mg/dL — ABNORMAL HIGH (ref 70–99)
Glucose-Capillary: 169 mg/dL — ABNORMAL HIGH (ref 70–99)
Glucose-Capillary: 189 mg/dL — ABNORMAL HIGH (ref 70–99)
Glucose-Capillary: 216 mg/dL — ABNORMAL HIGH (ref 70–99)

## 2019-11-23 MED ORDER — CHLORHEXIDINE GLUCONATE CLOTH 2 % EX PADS: 6.0000 | MEDICATED_PAD | Freq: Every day | CUTANEOUS | Status: DC

## 2019-11-23 MED ORDER — SORBITOL 70 % SOLN: 30.0000 mL | Freq: Every day | Status: DC | PRN

## 2019-11-23 MED ORDER — CHLORHEXIDINE GLUCONATE CLOTH 2 % EX PADS
6.0000 | MEDICATED_PAD | Freq: Two times a day (BID) | CUTANEOUS | Status: DC
  Administered 2019-11-23 – 2019-12-15 (×19): 6 via TOPICAL

## 2019-11-23 MED ORDER — OSMOLITE 1.5 CAL PO LIQD
1000.0000 mL | ORAL | Status: DC
  Administered 2019-11-23 – 2019-12-07 (×12): 1000 mL
  Filled 2019-11-23 (×21): qty 1000

## 2019-11-23 MED ORDER — ALPRAZOLAM 0.25 MG PO TABS
0.2500 mg | ORAL_TABLET | Freq: Three times a day (TID) | ORAL | Status: DC | PRN
  Administered 2019-11-24: 0.25 mg via ORAL
  Filled 2019-11-23: qty 1

## 2019-11-23 NOTE — Progress Notes (Signed)
[place] PHYSICAL MEDICINE & REHABILITATION PROGRESS NOTE   Subjective/Complaints: Pt up in bed. Alert. Doesn't indicate pain. Reasonable night per nursing  ROS: Patient denies fever, rash, sore throat, blurred vision, nausea, vomiting, diarrhea, cough, shortness of breath or chest pain, joint or back pain, headache, or mood change.    Objective:   No results found. No results for input(s): WBC, HGB, HCT, PLT in the last 72 hours. No results for input(s): NA, K, CL, CO2, GLUCOSE, BUN, CREATININE, CALCIUM in the last 72 hours.  Intake/Output Summary (Last 24 hours) at 11/23/2019 1212 Last data filed at 11/23/2019 0430 Gross per 24 hour  Intake --  Output 1650 ml  Net -1650 ml        Physical Exam: Vital Signs Blood pressure (!) 134/56, pulse 81, temperature 98.2 F (36.8 C), temperature source Oral, resp. rate 16, height 6\' 1"  (1.854 m), weight 73 kg, SpO2 100 %.   Constitutional: No distress . Vital signs reviewed. HEENT: EOMI, oral membranes moist Neck: supple, trach with PMV, wet voice Cardiovascular: RRR without murmur. No JVD    Respiratory/Chest:scattered rhonchi, non labored    GI/Abdomen: BS +, non-tender, non-distended, PEG snug Ext: no clubbing, cyanosis, or edema Psych: pleasant and cooperative Skin: No evidence of breakdown, no evidence of rash Neurologic: garbled speech, expressive langauge deficits, mild receptive deficits. Cranial nerves II through XII intact, motor strength is 5/5 inleft deltoid, bicep, tricep, grip, hip flexor, knee extensors, ankle dorsiflexor and plantar flexor 3- RIght delt bi, tri, grip, 4- to 4/5 Right HF, KE, ADF   Musculoskeletal: Full range of motion in all 4 extremities. No joint swelling  Assessment/Plan: 1. 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  Physiatrist is providing close team supervision and 24 hour  management of active medical problems listed below.  Physiatrist and rehab team continue to assess barriers to discharge/monitor patient progress toward functional and medical goals  Care Tool:  Bathing    Body parts bathed by patient: Chest, Abdomen, Front perineal area, Face   Body parts bathed by helper: Right arm, Left arm, Buttocks, Right upper leg, Left upper leg, Right lower leg, Left lower leg     Bathing assist Assist Level: Maximal Assistance - Patient 24 - 49%     Upper Body Dressing/Undressing Upper body dressing   What is the patient wearing?: Pull over shirt    Upper body assist Assist Level: Maximal Assistance - Patient 25 - 49%    Lower Body Dressing/Undressing Lower body dressing      What is the patient wearing?: Pants, Incontinence brief     Lower body assist Assist for lower body dressing: Total Assistance - Patient < 25%     Toileting Toileting Toileting Activity did not occur and hygiene only): N/A (no void or bm)  Toileting assist Assist for toileting:  (foley)     Transfers Chair/bed transfer  Transfers assist     Chair/bed transfer assist level: Moderate Assistance - Patient 50 - 74%     Locomotion Ambulation   Ambulation assist   Ambulation activity did not occur: Safety/medical concerns          Walk 10 feet activity   Assist  Walk 10 feet activity did not occur: Safety/medical concerns        Walk 50 feet activity   Assist Walk 50 feet with 2 turns activity did not occur:  Safety/medical concerns         Walk 150 feet activity   Assist Walk 150 feet activity did not occur: Safety/medical concerns         Walk 10 feet on uneven surface  activity   Assist Walk 10 feet on uneven surfaces activity did not occur: Safety/medical concerns         Wheelchair     Assist Will patient use wheelchair at discharge?: Yes Type of Wheelchair: Manual Wheelchair activity did not occur:  Safety/medical concerns         Wheelchair 50 feet with 2 turns activity    Assist    Wheelchair 50 feet with 2 turns activity did not occur: Safety/medical concerns       Wheelchair 150 feet activity     Assist  Wheelchair 150 feet activity did not occur: Safety/medical concerns       Blood pressure (!) 134/56, pulse 81, temperature 98.2 F (36.8 C), temperature source Oral, resp. rate 16, height 6\' 1"  (1.854 m), weight 73 kg, SpO2 100 %.    Medical Problem List and Plan: 1.Right hemiparesis with severe aphasia/dysphagiasecondary to left MCA distribution infarct as well as history of left MCA infarct -patient may shower   -ELOS/Goals: 21-24d 2. Antithrombotics: -DVT/anticoagulation:SCDs -antiplatelet therapy: Aspirin 81 mg daily and Plavix 75 mg daily 3. Pain Management:Tylenol as needed 4. Mood:Provide emotional support -antipsychotic agents: Seroquel 50 mg nightly 5. Neuropsych: This patientis notcapable of making decisions on hisown behalf. 6. Skin/Wound Care:Routine skin checks 7. Fluids/Electrolytes/Nutrition:Routine in and outs with follow-up chemistries 8. Seizure disorder. Dilantin 175 mg 3 times daily, Keppra 1000 mg twice daily, Vimpat 150 mg twice daily. Follow-up neurology services 9. Diabetes mellitus. Hemoglobin A1c 7.2. Lantus insulin 6 units nightly, NovoLog 2 units every 4 hours. CBG (last 3)  Recent Labs    11/23/19 0415 11/23/19 0740 11/23/19 1131  GLUCAP 132* 152* 169*  well controlled 9/20  10. Dysphagia. NPO except for trials with SLP. Status post gastrostomy tube 11/10/2019 per interventional radiology. Follow-up speech therapy 11. Hypertension. Hydralazine 50 mg every 8 hours, Cardura 1 mg daily, Norvasc 10 mg daily.    -controlled today Vitals:   11/23/19 0848 11/23/19 1148  BP:    Pulse: 78 81  Resp: 18 16  Temp:    SpO2: 98% 100%    12. Acute on  chronic respiratory failure. Status post tracheostomy tube. Presently with a #6 Shiley flexible cuffless trach as of 11/02/2019 followed by pulmonary services Dr. 11/04/2019  -plan is to continue trach and f/u with pulmonary in trach clinic 4-6 weeks  13. Hyperlipidemia. Lipitor 14. Urinary retention. Urecholine 15 mg 3 times daily. Check PVR   9/20 remove foley LOS: 2 days A FACE TO FACE EVALUATION WAS PERFORMED  10/20 11/23/2019, 12:12 PM

## 2019-11-23 NOTE — Progress Notes (Signed)
Physical Therapy Session Note  Patient Details  Name: Corey Bennett MRN: [id number] Date of Birth: [birth year]  Today's Date: 11/23/2019 PT Individual Time: 1300-1330 PT Individual Time Calculation (min): 30 min   Short Term Goals: Week 1:  PT Short Term Goal 1 (Week 1): Pt will perform least restrictive transfer with min A consistently PT Short Term Goal 2 (Week 1): Pt will initiate gait training PT Short Term Goal 3 (Week 1): Pt will tolerate sitting out of bed x 1 hour  Skilled Therapeutic Interventions/Progress Updates:     Pt received seated in TIS and requesting to go back to bed, but is agreeable to therapy with encouragement. Indicates pain in RUE. Number not provided. PT provides repositioning to address pain. Pt performs sit to stand with minA, standing abruptly and requiring stability at hips and shoulders for safety. PT guides pt's hand to R hand splint on RW and pt marches in place ~30 seconds. After rest break, pt adamantly insisting on going back to bed, despite PT encouraging participation. Pt performs stand step transfer back to bed with modA and sit to supine with minA. Left supine in bed with alarm intact and all needs within reach. Pt misses 30 minutes due to refusal to participate.  Therapy Documentation Precautions:  Precautions Precautions: Fall Precaution Comments: trach, PEG, trach collar at 35% Restrictions Weight Bearing Restrictions: No    Therapy/Group: Individual Therapy  Beau Fanny, PT, DPT 11/23/2019, 3:54 PM

## 2019-11-23 NOTE — Progress Notes (Signed)
Speech Language Pathology Daily Session Note  Patient Details  Name: Sem Mccaughey MRN: [id number] Date of Birth: [birth year]  Today's Date: 11/23/2019 SLP Individual Time: 4765-4650 SLP Individual Time Calculation (min): 60 min  Short Term Goals: Week 1: SLP Short Term Goal 1 (Week 1): Pt will consume therapeutic trials of thin liquids via teaspoon with minimal overt s/s of aspiration and mod assist verbal cues for use of swallowing precautions. SLP Short Term Goal 2 (Week 1): Pt will use multimodal means of communication (picture exchange system, gestures, verbalization) to convey immediate, basic needs and wants with mod assist multimodal cues. SLP Short Term Goal 3 (Week 1): Pt will repeat 2-3 word functional phrases with max assist multimodal cues to recognize and correct verbal errors. SLP Short Term Goal 4 (Week 1): Pt will wear speaking valve during all waking hours without reports of changes in vital signs or discomfort with mod I.  Skilled Therapeutic Interventions: Skilled treatment session focused on communication and swallowing goals. SLP facilitated session by donning the PMSV. Patient wore the PMSV throughout the entirety of the session with all vitals remaining within functional limits. Patient without evidence of breath stacking and was able to achieve adequate phonation. Patient had an intermittent wet vocal quality that he cleared with cued coughs but was unable to orally expectorate secretions. SLP provided oral care via the suction toothbrush with patient becoming visibly anxious. Patient also performed 3 repetitions of EMST and had to stop due to what appeared to be anxiety and when asked, patient endorsed. Patient also declined trials of liquids.  SLP provided support and made physician aware.  Patient was able to read aloud with extra time and overall Min A verbal cues at the word and phrase level. Therefore, SLP created a simple list of words that patient can utilize for basic  communication of wants/needs. Patient also answered yes/no questions with 100% accuracy. Patient left upright in bed with alarm on, PMSV in place and all needs within reach. Continue with current plan of care.      Pain Pain Assessment Faces Pain Scale: Hurts a little bit  Therapy/Group: Individual Therapy  Lesbia Ottaway 11/23/2019, 12:15 PM

## 2019-11-23 NOTE — Care Management (Signed)
[facility]patient Rehabilitation Center Individual Statement of Services  Patient Name:  Corey Bennett  Date:  11/23/2019  Welcome to the Inpatient Rehabilitation Center.  Our goal is to provide you with an individualized program based on your diagnosis and situation, designed to meet your specific needs.  With this comprehensive rehabilitation program, you will be expected to participate in at least 3 hours of rehabilitation therapies Monday-Friday, with modified therapy programming on the weekends.  Your rehabilitation program will include the following services:  Physical Therapy (PT), Occupational Therapy (OT), Speech Therapy (ST), 24 hour per day rehabilitation nursing, Therapeutic Recreaction (TR), Psychology, Neuropsychology, Care Coordinator, Rehabilitation Medicine, Nutrition Services, Pharmacy Services and Other  Weekly team conferences will be held on Tuesdays to discuss your progress.  Your Inpatient Rehabilitation Care Coordinator will talk with you frequently to get your input and to update you on team discussions.  Team conferences with you and your family in attendance may also be held.  Expected length of stay: 14-21 days  Overall anticipated outcome: Minimal Assistance  Depending on your progress and recovery, your program may change. Your Inpatient Rehabilitation Care Coordinator will coordinate services and will keep you informed of any changes. Your Inpatient Rehabilitation Care Coordinator's name and contact numbers are listed  below.  The following services may also be recommended but are not provided by the Inpatient Rehabilitation Center:   Driving Evaluations  Home Health Rehabiltiation Services  Outpatient Rehabilitation Services  Vocational Rehabilitation   Arrangements will be made to provide these services after discharge if needed.  Arrangements include referral to agencies that provide these services.  Your insurance has been verified to be:  BCBS  Your primary  doctor is:  No PCP  Pertinent information will be shared with your doctor and your insurance company.  Inpatient Rehabilitation Care Coordinator:  Susie Cassette [phone] or ([phone]  Information discussed with and copy given to patient by: Gretchen Short, 11/23/2019, 12:01 PM

## 2019-11-23 NOTE — Progress Notes (Signed)
Initial Nutrition Assessment  DOCUMENTATION CODES:   Non-severe (moderate) malnutrition in context of chronic illness  INTERVENTION:  Continue tube feeding via PEG: - Osmolite 1.5 @ 70 ml/hr to run over 20 hours (tube feeds can be held for up to 4 hours for therapies) - ProSource TF 45 ml BID - Free water flushes per MD/PA  Tube feeding regimen provides 2180 kcal, 110 grams of protein, and 1067 ml of H2O.   NUTRITION DIAGNOSIS:   Moderate Malnutrition related to chronic illness (stroke with dysphagia) as evidenced by mild fat depletion, moderate fat depletion, mild muscle depletion, moderate muscle depletion.  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 Presented 10/10/19 with seizure-like activity requiring intubation. CT/MRI showed large territory chronic hemorrhagic infarct in the left frontal lobe. Pt extubated 10/14/19 but required reintubation and tracheostomy performed 10/22/19. Currently NPO with G-tube placed 11/10/19. Admitted to CIR on 9/18.   Met with pt briefly at bedside. Pt endorses losing weight but unable to report his UBW.  RD will adjust TF such that TF can be held for up to 4 hours for therapies.  Weight history in chart is limited. Current weight is consistent with weight from 3 months ago. Pt's weight has fluctuated between 68-75 kg over the last 3 moths. Will monitor trends during CIR admit.  Current TF: Osmolite 1.5 @ 55 ml/hr, ProSource TF 45 ml BID  Medications reviewed and include: pepcid, SSI q 4 hours, Novolog 2 units q 4 hours, Lantus 6 units daily, dilantin per tube, senokot  Labs reviewed: hemoglobin 8.9 CBG's:  132-197 x 24 hours  UOP: 1650 ml x 24 hours  NUTRITION - FOCUSED PHYSICAL EXAM:    Most Recent Value  Orbital Region Mild depletion  Upper Arm Region Moderate depletion  Thoracic and Lumbar Region Mild depletion  Buccal Region Mild depletion  Temple Region Mild depletion  Clavicle Bone Region Moderate depletion  Clavicle and Acromion Bone Region Moderate depletion  Scapular Bone Region Mild depletion  Dorsal Hand Mild depletion  Patellar Region Moderate depletion  Anterior Thigh Region Moderate depletion  Posterior Calf Region Moderate depletion  Edema (RD Assessment) None  Hair Reviewed  Eyes Reviewed  Mouth Reviewed  Skin Reviewed  Nails Reviewed       Diet Order:   Diet Order            Diet NPO time specified  Diet effective midnight                 EDUCATION NEEDS:   No education needs have been identified at this time  Skin:  Skin Assessment: Reviewed RN Assessment  Last BM:  11/16/19  Height:   Ht Readings from Last 1 Encounters:  11/21/19 _0  (1.854 m)    Weight:   Wt Readings from Last 1 Encounters:  11/23/19 73 kg    Ideal Body Weight:  83.6 kg  BMI:  Body mass index is 21.23 kg/m.  Estimated Nutritional Needs:   Kcal:  2000-2200  Protein:  100-115 grams  Fluid:  >/= 2.0 L    Gaynell Face, MS, RD, LDN Inpatient Clinical Dietitian Please  see AMiON for contact information.

## 2019-11-23 NOTE — Progress Notes (Signed)
Occupational Therapy Session Note  Patient Details  Name: Corey Bennett MRN: [id number] Date of Birth: May 13, 1960  Today's Date: 11/23/2019 OT Individual Time: 1000-1100 OT Individual Time Calculation (min): 60 min    Short Term Goals: Week 1:  OT Short Term Goal 1 (Week 1): Pt will tolerate therapeutic activity for ~15 min in un supported sitting to improved cardiopulmonary endurance OT Short Term Goal 2 (Week 1): Pt will perform sit to stands inADL tasks with contact guard OT Short Term Goal 3 (Week 1): Pt will don shirt with hemi dressing techniques with min A OT Short Term Goal 4 (Week 1): Pt will perform functional transfer (stand pivot with min A )  Skilled Therapeutic Interventions/Progress Updates:    Pt received in bed, pt awake but not appearing to engage well this session. He expressed frustration and clearly said no to TED hose, hand splint.  He participated in some self care tasks but frequently c/o R arm pain with grimacing whenever this therapist moved or adjusted his arm.    ADL Retraining: Bed level for LB with pt actively flexing his legs to be able to wash his own legs and helping with hip bridging to don over hips. He has a cath bag so donning over R leg difficult for pt.  In wc for UB self care with mod A overall with b/d.  Refused to do oral care at sink.    Transfers:  Min to sit to EOB and stand, mod stand pivot to wc  Balance: sat EOB with close S for 3-4 minutes  Neuromuscular Re-Education:  Attempted some retrograde massage, PROM, application of splint to R hand but pt frequently grunting, expressing discomfort.  Positioned arm with pillow in TIS wc.  Pt resting in TIS chair with belt alarm on and all needs met.    Therapy Documentation Precautions:  Precautions Precautions: Fall Precaution Comments: trach, PEG, trach collar at 35% Restrictions Weight Bearing Restrictions: No    Vital Signs: Therapy Vitals Pulse Rate: 78 Resp: 18 Patient Position  (if appropriate): Lying Oxygen Therapy SpO2: 98 % O2 Device: Room Air Pain: Pain Assessment Pain Scale: Faces Faces Pain Scale: Hurts little more Pain Type: Acute pain Pain Location: Neck Pain Orientation: Left Pain Intervention(s): Medication (See eMAR)   Therapy/Group: Individual Therapy  [place] 11/23/2019, 9:36 AM

## 2019-11-23 NOTE — Progress Notes (Signed)
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   Stroke [facility])    Past Surgical History:  Past Surgical History:  Procedure Laterality Date   ENDARTERECTOMY Left 09/08/2019   Procedure: LEFT CAROTID ENDARTERECTOMY;  Surgeon: Angelia Mould, MD;  Location: [facility] OR;  Service: Vascular;  Laterality: Left;   IR GASTROSTOMY TUBE MOD SED  11/10/2019   PATCH ANGIOPLASTY Left 09/08/2019   Procedure: PATCH ANGIOPLASTY USING XENOSURE BIOLOGIC PATCH 1x6cm;  Surgeon: Angelia Mould, MD;  Location: [facility] OR;  Service: Vascular;  Laterality: Left;   TONSILLECTOMY     Social History:  reports that he has been smoking cigarettes. He has been smoking about 1.00 pack per day. He has never used smokeless tobacco. He reports current alcohol use. He reports that he does not use drugs.  Family / Support Systems Marital Status: Single Spouse/Significant Other: N/A Children: N/A Other Supports: Mother Anne Ng and brothers: Phill and Jori Moll Anticipated Caregiver: Mother and brother Abbe [place] Ability/Limitations of Caregiver: Mother is getting older; home needs to be renovated to accept him for care needs "he may" require. Caregiver Availability: Other (Comment) Family Dynamics: Pt is single; lives alone with no children. He has brother Jori Moll that lives in [place], [place]; and his mother and brother Phill both live in [place] History Preferred language: English Religion: Catholic Cultural Background: Pt has been working under a Designer, multimedia. Prior to his initial stroke this year  he was working in [place], [place] and once he had a stroke transferred here to  [facility] (ALF in [place]) for futher rehabilitation. Education: college Read: Yes Write: Yes Employment Status: Employed Name of Employer: [employer] Return to Work Plans: Pt is currently on LTD (brother Jori Moll reports up to 24 mos covered) Public relations account executive Issues: Denies Guardian/Conservator: N/A   Abuse/Neglect Abuse/Neglect Assessment Can Be Completed: Unable to assess, patient is non-responsive or altered mental status  Emotional Status Pt's affect, behavior and adjustment status: Pt appeared to be in good spirits at time of visit. Pt had limited speech ability, and used non-verbal communication (head nods) to answer SW questions. Recent Psychosocial Issues: Unable to assess Psychiatric History: Unable to assess Substance Abuse History: Denies  Patient / Family Perceptions, Expectations & Goals Pt/Family understanding of illness & functional limitations: Pt family have a general understanding of his care needs Premorbid pt/family roles/activities: independent Anticipated changes in roles/activities/participation: Assistance with ADLs/IADLs  Education officer, environmental Agencies: None Premorbid Home Care/DME Agencies: None Transportation available at discharge: TBD  Discharge Planning Living Arrangements: Other (Comment) (TBD) Support Systems: Parent, Other relatives Type of Residence: Other (Comment) (TBD) [place] Name: Other (enter name of facility below) Chief Executive Officer) [place] Name: [place] Resources: Multimedia programmer (specify) Nurse, mental health) Financial Resources: Employment Financial Screen Referred: No Living Expenses: Own Money Management: Family Does the patient have any problems obtaining your medications?: No Care Coordinator Anticipated Follow Up Needs: Other (comment) (TBD) Expected length of stay: 14-21 days  Clinical Impression SW met with pt in room to introduce self, explain role, and discuss  discharge process. Pt used more noverbal communication (head nods) when  SW asked questions. SW made pt aware there will be follow-up with his brother Jori Moll. SW spoke wit Jori Moll [phone]) to discuss d/c plan. Reports the plan is for pt to d/c to [place] where their mother and older brother- Phill are located. States they are working on ALF or SNF locations for pt. Pt has the potential to d/c to home however it would be after further rehabilitation and the home would require some renovations. States their mother and brother Phill are his HCPOA/POA and he is assisting with coordinating care since pt resides here in [place]. SW discussed exploring if pt has any VA benefits, and establishing a PCP in local area, and possible d/c with PMV, portable sux, and TF. SW informed on ELOS 14-21 days, and SW will follow-up after team conference with updates.   Keshun Berrett A Tameria Patti 11/23/2019, 12:18 PM

## 2019-11-24 ENCOUNTER — Inpatient Hospital Stay (HOSPITAL_COMMUNITY): Payer: BC Managed Care – PPO | Admitting: Occupational Therapy

## 2019-11-24 ENCOUNTER — Inpatient Hospital Stay (HOSPITAL_COMMUNITY): Payer: BC Managed Care – PPO

## 2019-11-24 ENCOUNTER — Encounter: Payer: BC Managed Care – PPO | Admitting: Occupational Therapy

## 2019-11-24 ENCOUNTER — Encounter: Payer: BC Managed Care – PPO | Admitting: Speech Pathology

## 2019-11-24 DIAGNOSIS — E440 Moderate protein-calorie malnutrition: Secondary | ICD-10-CM | POA: Insufficient documentation

## 2019-11-24 LAB — GLUCOSE, CAPILLARY
Glucose-Capillary: 133 mg/dL — ABNORMAL HIGH (ref 70–99)
Glucose-Capillary: 140 mg/dL — ABNORMAL HIGH (ref 70–99)
Glucose-Capillary: 142 mg/dL — ABNORMAL HIGH (ref 70–99)
Glucose-Capillary: 169 mg/dL — ABNORMAL HIGH (ref 70–99)
Glucose-Capillary: 182 mg/dL — ABNORMAL HIGH (ref 70–99)
Glucose-Capillary: 197 mg/dL — ABNORMAL HIGH (ref 70–99)

## 2019-11-24 MED ORDER — ALPRAZOLAM 0.25 MG PO TABS
0.2500 mg | ORAL_TABLET | Freq: Three times a day (TID) | ORAL | Status: DC | PRN
  Administered 2019-11-24 – 2019-12-15 (×11): 0.25 mg
  Filled 2019-11-24 (×13): qty 1

## 2019-11-24 MED ORDER — SORBITOL 70 % SOLN
30.0000 mL | Freq: Every day | Status: DC | PRN
  Administered 2019-11-24 – 2019-12-10 (×2): 30 mL
  Filled 2019-11-24 (×2): qty 30

## 2019-11-24 MED ORDER — GABAPENTIN 250 MG/5ML PO SOLN
100.0000 mg | Freq: Three times a day (TID) | ORAL | Status: DC
  Administered 2019-11-24 – 2019-12-07 (×39): 100 mg
  Filled 2019-11-24 (×41): qty 2

## 2019-11-24 MED ORDER — LIDOCAINE HCL URETHRAL/MUCOSAL 2 % EX GEL
1.0000 "application " | CUTANEOUS | Status: DC | PRN
  Administered 2019-11-27: 1 via URETHRAL
  Filled 2019-11-24 (×7): qty 5
  Filled 2019-11-24: qty 15
  Filled 2019-11-24 (×3): qty 5
  Filled 2019-11-24: qty 20

## 2019-11-24 MED ORDER — INSULIN GLARGINE 100 UNIT/ML ~~LOC~~ SOLN
4.0000 [IU] | Freq: Two times a day (BID) | SUBCUTANEOUS | Status: DC
  Administered 2019-11-24 – 2019-11-25 (×2): 4 [IU] via SUBCUTANEOUS
  Filled 2019-11-24 (×5): qty 0.04

## 2019-11-24 NOTE — Progress Notes (Signed)
Physical Therapy Session Note  Patient Details  Name: Corey Bennett MRN: [id number] Date of Birth: Oct 09, 1960  Today's Date: 11/24/2019 PT Individual Time: 1017-5102 PT Individual Time Calculation (min): 40 min   Short Term Goals: Week 1:  PT Short Term Goal 1 (Week 1): Pt will perform least restrictive transfer with min A consistently PT Short Term Goal 2 (Week 1): Pt will initiate gait training PT Short Term Goal 3 (Week 1): Pt will tolerate sitting out of bed x 1 hour  Skilled Therapeutic Interventions/Progress Updates:     Pt received supine in bed and agrees to therapy. No complaint of pain. Supine to sit with minA. Stand pivot transfer to [facility] with minA. WC transport to gym for time management. Pt performs sit to stand with minA and PT assist pt to place RUE in hand splint on RW. Pt ambualtes 25' with modA +1 and +2 WC follow. Forward flexed posture and very short bilateral stride lengths, with R knee remaining in slight flexion throughout, but no buckling. Pt ambulates additional bout of 40' with same assist level. Pt reports  SOB and vitals taken with O2 sats >98% and HR in 90s. Pt performs multiple reps of sit to stand in // bars with CGA, for transfer training, then performs lateral weight shifting with PT providing tactile cuing at R quad for improved contraction. No overt buckling noted but with some difficulty achieving terminal knee extension consistently. Pt left seated in WC with alarm intact and all needs within reach.  Therapy Documentation Precautions:  Precautions Precautions: Fall Precaution Comments: trach, PEG, trach collar at 35% Restrictions Weight Bearing Restrictions: No    Therapy/Group: Individual Therapy  Beau Fanny, PT, DPT 11/24/2019, 3:41 PM

## 2019-11-24 NOTE — Progress Notes (Signed)
Occupational Therapy Session Note  Patient Details  Name: Corey Bennett MRN: [id number] Date of Birth: 1960-04-06  Today's Date: 11/24/2019 OT Individual Time: 1105-1200 OT Individual Time Calculation (min): 55 min    Short Term Goals: Week 1:  OT Short Term Goal 1 (Week 1): Pt will tolerate therapeutic activity for ~15 min in un supported sitting to improved cardiopulmonary endurance OT Short Term Goal 2 (Week 1): Pt will perform sit to stands inADL tasks with contact guard OT Short Term Goal 3 (Week 1): Pt will don shirt with hemi dressing techniques with min A OT Short Term Goal 4 (Week 1): Pt will perform functional transfer (stand pivot with min A )  Skilled Therapeutic Interventions/Progress Updates:    Pt received in bed and initially shaking head no as far as getting out of bed.  Informed pt of the plan to have him be in rehab for 3 weeks to build strength and increase independence so he can return to [place] via car or plane.  Pt continuing to say no, then told pt he has to participate as much as possible to stay in rehab. If he refuses to often, insurance will not support his stay. Pt then agreed to get up.  He put in a great deal more physical effort today and was able to sit to EOB, stand up and then stand pivot from bed to regular wc all with MINIMAL A.  No R lean or pushing posteriorly today. Pt used BLE actively to push up well.   Supported R arm with transfers. Pt transferred to regular wc and sat at sink to complete B/D with MAX cues and encouragement as pt kept shaking head no.  He was able to bathe and dress UB with mod A, washed his legs and donned pants with max A.   Pt then transferred back to bed to sit EOB with S as therapist worked on retrograde massage to his hand, aarom for elbow with trace movement. Moved to supine, to work on [employer] for [employer] with PNF patterns. Pt only putting in min effort but 2x observed pt actively lifting arm towards side during b/d.    Pt set  up in bed with pillows to support his arm.  Bed alarm set and all needs met.    Therapy Documentation Precautions:  Precautions Precautions: Fall Precaution Comments: trach, PEG, trach collar at 35% Restrictions Weight Bearing Restrictions: No    Vital Signs: Therapy Vitals Pulse Rate: 80 Resp: 18 Patient Position (if appropriate): Lying Oxygen Therapy SpO2: 100 % O2 Device: Room Air Pain: Pain Assessment Pain Scale: Faces Faces Pain Scale: Hurts little more Pain Type: Neuropathic pain Pain Location: Arm Pain Orientation: Right Pain Descriptors / Indicators: Guarding Pain Onset: With Activity Pain Intervention(s): Repositioned    Therapy/Group: Individual Therapy  Primrose Oler 11/24/2019, 12:42 PM

## 2019-11-24 NOTE — Plan of Care (Signed)
  Problem: Consults Goal: RH STROKE PATIENT EDUCATION Description: See Patient Education module for education specifics  Outcome: Progressing   Problem: RH BLADDER ELIMINATION Goal: RH STG MANAGE BLADDER WITH MEDICATION WITH ASSISTANCE Description: STG Manage Bladder With Medication With min Assistance. Outcome: Progressing   Problem: RH SKIN INTEGRITY Goal: RH STG SKIN FREE OF INFECTION/BREAKDOWN Description: No new skin breakdown this admission Outcome: Progressing Goal: RH STG MAINTAIN SKIN INTEGRITY WITH ASSISTANCE Description: STG Maintain Skin Integrity With min Assistance. Outcome: Progressing   Problem: RH SAFETY Goal: RH STG ADHERE TO SAFETY PRECAUTIONS W/ASSISTANCE/DEVICE Description: STG Adhere to Safety Precautions With min Assistance/Device. Outcome: Progressing Goal: RH STG DECREASED RISK OF FALL WITH ASSISTANCE Description: STG Decreased Risk of Fall With min Assistance. Outcome: Progressing   Problem: RH PAIN MANAGEMENT Goal: RH STG PAIN MANAGED AT OR BELOW PT'S PAIN GOAL Description: Less than 3 out of 10 Outcome: Progressing   Problem: RH KNOWLEDGE DEFICIT Goal: RH STG INCREASE KNOWLEDGE OF DIABETES Description: Patient will demonstrate knowledge of diabetes medications, diabetes diet, blood sugar parameters, and follow up care with the MD with min assist from the CIR staff.  Outcome: Progressing Goal: RH STG INCREASE KNOWLEDGE OF HYPERTENSION Description: Patient will demonstrate knowledge of HTN medications, HTN diet, blood pressure parameters, and follow up care with the MD with min assist from the CIR staff.   Outcome: Progressing Goal: RH STG INCREASE KNOWLEDGE OF DYSPHAGIA/FLUID INTAKE Description: Patient and family will be able to manage Dysphagia with dietary restriction using handouts and educational materials with min assist  Outcome: Progressing Goal: RH STG INCREASE KNOWLEGDE OF HYPERLIPIDEMIA Description: Patient will demonstrate knowledge  of HLD medications, and follow up care with the MD with min assist from the CIR staff. Outcome: Progressing Goal: RH STG INCREASE KNOWLEDGE OF STROKE PROPHYLAXIS Description: Patient will demonstrate knowledge of secondary medications for prevention of future strokes, and follow up care with the MD with min assist from the CIR staff.  Outcome: Progressing

## 2019-11-24 NOTE — IPOC Note (Signed)
Overall Plan of Care [facility]) Patient Details Name: Corey Bennett MRN: [id number] DOB: [birth year]  Admitting Diagnosis: Left middle cerebral artery stroke [facility])  Hospital Problems: Principal Problem:   Left middle cerebral artery stroke (HCC) Active Problems:   Malnutrition of moderate degree     Functional Problem List: Nursing Bladder, Bowel, Medication Management, Nutrition, Skin Integrity  PT Balance, Endurance, Motor, Pain, Perception, Safety, Sensory  OT Balance, Skin Integrity, Sensory, Cognition, Endurance, Motor, Nutrition, Pain, Perception, Safety  SLP Linguistic, Motor, Nutrition  TR         Basic ADL's: OT Eating, Grooming, Bathing, Dressing, Toileting     Advanced  ADL's: OT       Transfers: PT Bed Mobility, Bed to Chair, Car, Furniture, Floor  OT Toilet, Research scientist (life sciences): PT Ambulation, Psychologist, prison and probation services, Stairs     Additional Impairments: OT Fuctional Use of Upper Extremity  SLP Swallowing, Communication comprehension, expression    TR      Anticipated Outcomes Item Anticipated Outcome  Self Feeding TBD  Swallowing  Min assist   Basic self-care  supervision to min A  Toileting  min A   Bathroom Transfers min A  Bowel/Bladder  min assist  Transfers  Supervision  Locomotion  Supervision with LRAD  Communication  Min assist  Cognition     Pain  less than 3 out of 10  Safety/Judgment  min assist   Therapy Plan: PT Intensity: Minimum of 1-2 x/day ,45 to 90 minutes PT Frequency: 5 out of 7 days PT Duration Estimated Length of Stay: 18-21 days OT Intensity: Minimum of 1-2 x/day, 45 to 90 minutes OT Frequency: 5 out of 7 days OT Duration/Estimated Length of Stay: ~14-18 days SLP Intensity: Minumum of 1-2 x/day, 30 to 90 minutes SLP Frequency: 3 to 5 out of 7 days SLP Duration/Estimated Length of Stay: 14-18 days   Due to the current state of emergency, patients may not be receiving their 3-hours of Medicare-mandated  therapy.   Team Interventions: Nursing Interventions Patient/Family Education, Bladder Management, Bowel Management, Medication Management, Skin Care/Wound Management, Dysphagia/Aspiration Precaution Training, Discharge Planning  PT interventions Ambulation/gait training, Balance/vestibular training, Cognitive remediation/compensation, Community reintegration, Discharge planning, Disease management/prevention, DME/adaptive equipment instruction, Functional electrical stimulation, Functional mobility training, Neuromuscular re-education, Pain management, Patient/family education, Psychosocial support, Splinting/orthotics, Therapeutic Activities, Therapeutic Exercise, UE/LE Strength taining/ROM, UE/LE Coordination activities, Visual/perceptual remediation/compensation, Wheelchair propulsion/positioning  OT Interventions Balance/vestibular training, Disease mangement/prevention, Neuromuscular re-education, Self Care/advanced ADL retraining, Cognitive remediation/compensation, DME/adaptive equipment instruction, Pain management, Skin care/wound managment, Community reintegration, Patient/family education, Splinting/orthotics, Functional electrical stimulation, Discharge planning, Functional mobility training, Psychosocial support, Therapeutic Activities, Visual/perceptual remediation/compensation, UE/LE Coordination activities, UE/LE Strength taining/ROM, Therapeutic Exercise, Wheelchair propulsion/positioning  SLP Interventions Cueing hierarchy, Dysphagia/aspiration precaution training, Environmental controls, Patient/family education, Speech/Language facilitation, Internal/external aids, Multimodal communication approach  TR Interventions    SW/CM Interventions Discharge Planning, Psychosocial Support, Patient/Family Education   Barriers to Discharge MD  Medical stability  Nursing Incontinence, Inaccessible home environment, Decreased caregiver support, Home environment access/layout, Lack of/limited  family support, Medication compliance    PT Trach, Nutrition means, New oxygen    OT Home environment access/layout, New oxygen, Trach (unclear about environment) concerned about pt traveling out of state (long distance) to get to d/c location  SLP      SW       Team Discharge Planning: Destination: PT-Assisted Living ,OT- Home , SLP-Home (family's home in [place]) Projected Follow-up: PT-Home health PT, 24 hour supervision/assistance, OT-  24 hour supervision/assistance,  Home health OT, SLP-Home Health SLP, 24 hour supervision/assistance Projected Equipment Needs: PT-To be determined, OT- To be determined, SLP-To be determined Equipment Details: PT-TBD pending progress, OT-  Patient/family involved in discharge planning: PT- Patient, Family member/caregiver,  OT-Patient, SLP-Patient  MD ELOS: 3 weeks Medical Rehab Prognosis:  Good Assessment: The patient has been admitted for CIR therapies with the diagnosis of left mca infarct complicated by respiratory failure and PEG dependent dysphagia. The team will be addressing functional mobility, strength, stamina, balance, safety, adaptive techniques and equipment, self-care, bowel and bladder mgt, patient and caregiver education, NMR, tone and pain mgt, language, swallowing, cognition, trach mgt. Goals have been set at min assist with self-care and communication/swallowing and supervision with mobility.   Due to the current state of emergency, patients may not be receiving their 3 hours per day of Medicare-mandated therapy.    Ranelle Oyster, MD, FAAPMR      See Team Conference Notes for weekly updates to the plan of care

## 2019-11-24 NOTE — Progress Notes (Signed)
Occupational Therapy Session Note  Patient Details  Name: Corey Bennett MRN: [id number] Date of Birth: [birth year]  Today's Date: 11/24/2019 OT Individual Time: 1415-1500 OT Individual Time Calculation (min): 45 min    Short Term Goals: Week 1:  OT Short Term Goal 1 (Week 1): Pt will tolerate therapeutic activity for ~15 min in un supported sitting to improved cardiopulmonary endurance OT Short Term Goal 2 (Week 1): Pt will perform sit to stands inADL tasks with contact guard OT Short Term Goal 3 (Week 1): Pt will don shirt with hemi dressing techniques with min A OT Short Term Goal 4 (Week 1): Pt will perform functional transfer (stand pivot with min A )  Skilled Therapeutic Interventions/Progress Updates:    Patient alert and cooperative.  Expressive aphasia limits ability to express wants and needs.  He appears uncomfortable at times relieved with repositioning, he also appeared frustrated or anxious at times but seems calmed with conversation related to current situation.  AAROM/inhibition of flexors and facilitation of extensors proximally with good tolerance, no wrist or hand volitional movement noted at this time.  hand with mild edema - elevated at close of session.  Sit to stand from w/c x 2 with min A - tactile cues for weight shift/L LE control/positioning.  SPT w/c to bed with RW min/mod A, CS for unsupported sitting.  Sit to supine min A.  Completed left UE AROM activities with rest breaks between.  Provided foam sponge for left hand conditioning.  Patient remained in bed - HOB >30, bed alarm set and call bell in hand at close of session.    Therapy Documentation Precautions:  Precautions Precautions: Fall Precaution Comments: trach, PEG, trach collar at 35% Restrictions Weight Bearing Restrictions: No   Therapy/Group: Individual Therapy  Barrie Lyme 11/24/2019, 7:42 AM

## 2019-11-24 NOTE — Progress Notes (Signed)
[facility] PHYSICAL MEDICINE & REHABILITATION PROGRESS NOTE   Subjective/Complaints: Sitting up in bed. SLP at beside working on language problems with him.   ROS: limited due to language/communication   Objective:   No results found. Recent Labs    11/23/19 1808  WBC 6.7  HGB 9.1*  HCT 27.6*  PLT 303   Recent Labs    11/23/19 1808  NA 137  K 3.7  CL 101  CO2 26  GLUCOSE 163*  BUN 26*  CREATININE 1.23  CALCIUM 8.8*    Intake/Output Summary (Last 24 hours) at 11/24/2019 1152 Last data filed at 11/24/2019 1000 Gross per 24 hour  Intake 2514.75 ml  Output 3700 ml  Net -1185.25 ml        Physical Exam: Vital Signs Blood pressure 131/66, pulse 80, temperature 98 F (36.7 C), resp. rate 18, height 6\' 1"  (1.854 m), weight 71.4 kg, SpO2 100 %.   Constitutional: No distress . Vital signs reviewed. HEENT: EOMI, oral membranes moist Neck: #6 trach Cardiovascular: RRR without murmur. No JVD    Respiratory/Chest: normal effort. Upper airway sounds GI/Abdomen: BS +, non-tender, non-distended, PEG snug agst belly, tender Ext: no clubbing, cyanosis, or edema Psych: pleasant and generally cooperative Skin: No evidence of breakdown, no evidence of rash Neurologic: expressive>receptive language deficits. Improved phonation. Unable to cough but more a language thing.  Cranial nerves II through XII intact, motor strength is 5/5 inleft deltoid, bicep, tricep, grip, hip flexor, knee extensors, ankle dorsiflexor and plantar flexor 3- RIght delt bi, tri, grip, 4- to 4/5 Right HF, KE, ADF, resting flexor tone RUE  Musculoskeletal: right shoulder Tender with ROM. RUE sensitive to touch.  Assessment/Plan: 1. 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  Physiatrist is providing close team supervision and 24 hour management of active medical problems listed  below.  Physiatrist and rehab team continue to assess barriers to discharge/monitor patient progress toward functional and medical goals  Care Tool:  Bathing    Body parts bathed by patient: Chest, Abdomen, Face, Right upper leg, Left upper leg, Right lower leg, Left lower leg   Body parts bathed by helper: Right arm, Left arm, Front perineal area, Buttocks     Bathing assist Assist Level: Moderate Assistance - Patient 50 - 74%     Upper Body Dressing/Undressing Upper body dressing   What is the patient wearing?: Pull over shirt    Upper body assist Assist Level: Moderate Assistance - Patient 50 - 74%    Lower Body Dressing/Undressing Lower body dressing      What is the patient wearing?: Incontinence brief     Lower body assist Assist for lower body dressing: Maximal Assistance - Patient 25 - 49%     Toileting Toileting Toileting Activity did not occur (Clothing management and hygiene only): N/A (no void or bm)  Toileting assist Assist for toileting: Total Assistance - Patient < 25%     Transfers Chair/bed transfer  Transfers assist     Chair/bed transfer assist level: Minimal Assistance - Patient > 75%     Locomotion Ambulation   Ambulation assist   Ambulation activity did not occur: Safety/medical concerns          Walk 10 feet activity   Assist  Walk 10 feet activity did not occur: Safety/medical concerns        Walk 50 feet activity   Assist Walk  50 feet with 2 turns activity did not occur: Safety/medical concerns         Walk 150 feet activity   Assist Walk 150 feet activity did not occur: Safety/medical concerns         Walk 10 feet on uneven surface  activity   Assist Walk 10 feet on uneven surfaces activity did not occur: Safety/medical concerns         Wheelchair     Assist Will patient use wheelchair at discharge?: Yes Type of Wheelchair: Manual Wheelchair activity did not occur: Safety/medical  concerns         Wheelchair 50 feet with 2 turns activity    Assist    Wheelchair 50 feet with 2 turns activity did not occur: Safety/medical concerns       Wheelchair 150 feet activity     Assist  Wheelchair 150 feet activity did not occur: Safety/medical concerns       Blood pressure 131/66, pulse 80, temperature 98 F (36.7 C), resp. rate 18, height 6\' 1"  (1.854 m), weight 71.4 kg, SpO2 100 %.    Medical Problem List and Plan: 1.Right hemiparesis with severe aphasia/dysphagiasecondary to left MCA distribution infarct as well as history of left MCA infarct -patient may shower   -ELOS/Goals: 21-24d 2. Antithrombotics: -DVT/anticoagulation:SCDs -antiplatelet therapy: Aspirin 81 mg daily and Plavix 75 mg daily 3. Pain Management:Tylenol as needed  -add gabapentin 100mg  tid for neuropathic pain RUE  -support hemiplegic shoulder  -tone mgt 4. Mood:Provide emotional support -antipsychotic agents: Seroquel 50 mg nightly 5. Neuropsych: This patientis notcapable of making decisions on hisown behalf. 6. Skin/Wound Care:Routine skin checks 7. Fluids/Electrolytes/Nutrition:Routine in and outs with follow-up chemistries 8. Seizure disorder. Dilantin 175 mg 3 times daily, Keppra 1000 mg twice daily, Vimpat 150 mg twice daily. Follow-up neurology services 9. Diabetes mellitus. Hemoglobin A1c 7.2. Lantus insulin 6 units nightly, NovoLog 2 units every 4 hours. CBG (last 3)  Recent Labs    11/24/19 0414 11/24/19 0734 11/24/19 1133  GLUCAP 169* 133* 197*  fair to poor control 9/21. adjust lantus to 4u bid  10. Dysphagia. NPO except for trials with SLP. Status post gastrostomy tube 11/10/2019 per interventional radiology. advance per SLP 11. Hypertension. Hydralazine 50 mg every 8 hours, Cardura 1 mg daily, Norvasc 10 mg daily.    -controlled today Vitals:   11/24/19 0834 11/24/19 1121  BP:    Pulse:  78 80  Resp: 18 18  Temp:    SpO2: 100% 100%    12. Acute on chronic respiratory failure. Status post tracheostomy tube. Presently with a #6 Shiley flexible cuffless trach as of 11/02/2019 followed by pulmonary services Dr. 11/26/19  -plan is to continue trach and f/u with pulmonary in trach clinic 4-6 weeks  13. Hyperlipidemia. Lipitor 14. Urinary retention. Urecholine 15 mg 3 times daily. Check PVR   9/20 removed foley   9/21 continue voiding trial, I/O cath prn LOS: 3 days A FACE TO FACE EVALUATION WAS PERFORMED  10/20 11/24/2019, 11:52 AM

## 2019-11-24 NOTE — Patient Care Conference (Signed)
Inpatient RehabilitationTeam Conference and Plan of Care Update Date: 11/24/2019   Time: 10:38 AM    Patient Name: Corey Bennett      Medical Record Number: [id number]  Date of Birth: 05-Jan-1961 Sex: Male         Room/Bed: 4W25C/4W25C-01 Payor Info: Payor: BLUE CROSS BLUE SHIELD / Plan: BCBS COMM PPO / Product Type: *No Product type* /    Admit Date/Time:  11/21/2019  2:14 PM  Primary Diagnosis:  Left middle cerebral artery stroke [facility])  Hospital Problems: Principal Problem:   Left middle cerebral artery stroke (HCC) Active Problems:   Malnutrition of moderate degree    Expected Discharge Date: Expected Discharge Date:  (3 weeks)  Team Members Present: Physician leading conference: Dr. Faith Rogue Care Coodinator Present: Cecile Sheerer, LCSWA;Garet Hooton Marlyne Beards, RN, BSN, CRRN Nurse Present: Margot Ables, LPN PT Present: Malachi Pro, PT OT Present: Primitivo Gauze, OT SLP Present: Feliberto Gottron, SLP PPS Coordinator present : Edson Snowball, Park Breed, SLP     Current Status/Progress Goal Weekly Team Focus  Bowel/Bladder   incontinent of b/b; LBM: 09/13 new prn sorbitol will be given in the AM  gain regular bowl pattern  assist with toileting needs prn   Swallow/Nutrition/ Hydration   NPO with PEG  Min A  trials of nectar-thick liquids via tsp, EMST, pharyngeal strengthening exercises   ADL's   min A sit to stand, mod A stand pivot, max A LB self care, mod  A UB self care, minimal active movement in RUE with increased tone in elbow flexors, c/o Rarm pain  Supervision with UB dressing, toilet transfers, sit to stand, standing balance; min A LB dressing, toileting, bathing, shower transfers,  functional mobility, postural control, RUE NMR, balance, self care training, pt education   Mobility   minA bed mobility, minA sit to stand, min/modA stand pivot transfer. very limited activity tolerance.  supervision  balance, transfers, initiation of gait training, activity  tolerance   Communication   Max-Total A  Min A  use of multimodal communicaton   Safety/Cognition/ Behavioral Observations  Mod A  Min A  emergent awareness   Pain   c/o pain to peg tube site; prn tylenol  pain level <5/10  asssess pain level QS and prn   Skin   Peg LUQ; trach uncuffed  remain free of new skin break/infection  assess skin QS and prn     Discharge Planning:  Plans to d/c to [place]. Location TBD as unsure if it will be an ALF or SNF.   Team Discussion: Foley dc'd 11/23/2019, having to In & Out cath patient. OT reports RUE tone and pain, came to EOB, performed a stand pivot to stand. Has min assist goals. PT reports min assist for bed mobility. SLP reports tolerating the PMSV well, and tsp's of Nectar liquids well. Has lots of anxiety. Patient on target to meet rehab goals: Lessen burden of care goals.  *See Care Plan and progress notes for long and short-term goals.   Revisions to Treatment Plan:  None at this time. Teaching Needs: Continue family education when needed.  Current Barriers to Discharge: Inaccessible home environment, Decreased caregiver support, Home enviroment access/layout, Trach, Incontinence, Lack of/limited family support and Nutritional means  Possible Resolutions to Barriers: Teach trach management, timed toileting, nutrition support, diabetes education, and family education when needed.     Medical Summary Current Status: left MCA infarct with dense right HP. signficant aphasia and dysphagia. +Trach and PEG. NPO. pain in RUE  as well as abdomen  Barriers to Discharge: Medical stability   Possible Resolutions to Barriers/Weekly Focus: regular trach mgt, optimizing nutrition/TF, improve control of DM. regular assessment of labs and vs   Continued Need for Acute Rehabilitation Level of Care: The patient requires daily medical management by a physician with specialized training in physical medicine and rehabilitation for the following  reasons: Direction of a multidisciplinary physical rehabilitation program to maximize functional independence : Yes Medical management of patient stability for increased activity during participation in an intensive rehabilitation regime.: Yes Analysis of laboratory values and/or radiology reports with any subsequent need for medication adjustment and/or medical intervention. : Yes   I attest that I was present, lead the team conference, and concur with the assessment and plan of the team.   Tennis Must 11/24/2019, 2:35 PM

## 2019-11-24 NOTE — Progress Notes (Signed)
Speech Language Pathology Daily Session Note  Patient Details  Name: Corey Bennett MRN: [id number] Date of Birth: 01/20/1961  Today's Date: 11/24/2019 SLP Individual Time: 2979-8921 SLP Individual Time Calculation (min): 55 min  Short Term Goals: Week 1: SLP Short Term Goal 1 (Week 1): Pt will consume therapeutic trials of thin liquids via teaspoon with minimal overt s/s of aspiration and mod assist verbal cues for use of swallowing precautions. SLP Short Term Goal 2 (Week 1): Pt will use multimodal means of communication (picture exchange system, gestures, verbalization) to convey immediate, basic needs and wants with mod assist multimodal cues. SLP Short Term Goal 3 (Week 1): Pt will repeat 2-3 word functional phrases with max assist multimodal cues to recognize and correct verbal errors. SLP Short Term Goal 4 (Week 1): Pt will wear speaking valve during all waking hours without reports of changes in vital signs or discomfort with mod I.  Skilled Therapeutic Interventions: Skilled SLP intervention focused on dysphagia and communication with PMV.Oral care completed at beginning of session.SLP placed PMV and pt tolerated for entire 60 mon session with spo2 remaining 96-99% for session. Pt consumed 8 tsp of thin liquid via tsp and tolerated with no overt s/sx of aspiration or penetration.  Pt repeated 2-3 word phrases with adequate loudness level. Mod-max a with visual cues to increase awareness of errors. Automatic speech tasks completed with Mod A with pt and SLP saying days of the week and months in unison. Pt left seated upright in bed with bed alarm set and all needs within reach. PMV left in place. RN notified. Cont with therapy per plan of care.      Pain Pain Assessment Pain Scale: Faces Faces Pain Scale: No hurt Pain Type: Acute pain Pain Location: Shoulder Pain Orientation: Right Pain Intervention(s): Medication (See eMAR)  Therapy/Group: Individual Therapy  Carlean Jews  Tabbatha Bordelon 11/24/2019, 9:53 AM

## 2019-11-24 NOTE — Progress Notes (Signed)
Physical Therapy Session Note  Patient Details  Name: Corey Bennett MRN: [id number] Date of Birth: [birth year]  Today's Date: 11/24/2019 PT Individual Time: 7408-1448 PT Individual Time Calculation (min): 25 min   Short Term Goals: Week 1:  PT Short Term Goal 1 (Week 1): Pt will perform least restrictive transfer with min A consistently PT Short Term Goal 2 (Week 1): Pt will initiate gait training PT Short Term Goal 3 (Week 1): Pt will tolerate sitting out of bed x 1 hour  Skilled Therapeutic Interventions/Progress Updates:    Pt received seated in bed, agreeable to participate in make up therapy session. Pt reports pain at rest, unable to indicate location. Nursing in room to assess pain and pt then indicates he has no pain. Pt on continuous feeding via PEG tube, paused feeding during therapy session. Semi-reclined to sitting EOB with min A for some trunk control, pt requires cues to attend to RUE with transfer. Pt's penis found to be bleeding in his brief, nursing notified and able to visually assess. Per nursing no intervention needed at this time and likely due to I/O cathing. Sit to stand with min A to RW with cues for safe transfer technique. Assisted pt with doffing brief and donning a new, clean depends. Pt encouraged to transfer to TIS and spend some time sitting out of bed. Pt refuses and indicates he wishes to lay back down. Sit to semi-reclined in bed with min A for RLE management. Pt left semi-reclined in bed with needs in reach, bed alarm in place. Restarted feed via PEG at end of session. Pt on room air throughout session, SpO2 remains at 98% and higher.  Therapy Documentation Precautions:  Precautions Precautions: Fall Precaution Comments: trach, PEG, trach collar at 35% Restrictions Weight Bearing Restrictions: No    Therapy/Group: Individual Therapy   Peter Congo, PT, DPT  11/24/2019, 10:00 AM

## 2019-11-24 NOTE — Progress Notes (Signed)
Performed I&O cath on patient, patient did not tolerate well. Extremely painful and urine was bloody with blood clots. Patient does not have lidocaine ordered for I&Os. Patient screaming entire time and trying to push away hand of writer while performing I&O

## 2019-11-24 NOTE — Progress Notes (Signed)
Patient ID: Corey Bennett, male   DOB: [birth year], 59 y.o.   MRN: [id number]  SW made efforts to meet with pt to inform on d/c date but pt sleeping. SW will continue to make efforts.   SW called pt brother [name] ([phone]) to provide updates form team conference, and d/c date within 3 weeks. SW informed that pt will d/c with trach and peg tube. He has a lot of questions surrounding why the trach needs to remain in place. SW did reiterate that pt will need SNF placement if still going ot [place].  SW explained SNF placement process.  Plan is to private pay for SNF placement and not use insurance. SW asked medical team to follow-up with pt brother to address medical concerns.   Cecile Sheerer, MSW, LCSWA Office: [phone] Cell: [phone] Fax: [phone]

## 2019-11-25 ENCOUNTER — Inpatient Hospital Stay (HOSPITAL_COMMUNITY): Payer: BC Managed Care – PPO

## 2019-11-25 ENCOUNTER — Inpatient Hospital Stay (HOSPITAL_COMMUNITY): Payer: BC Managed Care – PPO | Admitting: Physical Therapy

## 2019-11-25 ENCOUNTER — Inpatient Hospital Stay (HOSPITAL_COMMUNITY): Payer: BC Managed Care – PPO | Admitting: Speech Pathology

## 2019-11-25 ENCOUNTER — Encounter (HOSPITAL_COMMUNITY): Payer: BC Managed Care – PPO

## 2019-11-25 LAB — GLUCOSE, CAPILLARY
Glucose-Capillary: 106 mg/dL — ABNORMAL HIGH (ref 70–99)
Glucose-Capillary: 138 mg/dL — ABNORMAL HIGH (ref 70–99)
Glucose-Capillary: 146 mg/dL — ABNORMAL HIGH (ref 70–99)
Glucose-Capillary: 178 mg/dL — ABNORMAL HIGH (ref 70–99)
Glucose-Capillary: 196 mg/dL — ABNORMAL HIGH (ref 70–99)
Glucose-Capillary: 221 mg/dL — ABNORMAL HIGH (ref 70–99)

## 2019-11-25 MED ORDER — INSULIN GLARGINE 100 UNIT/ML ~~LOC~~ SOLN
5.0000 [IU] | Freq: Two times a day (BID) | SUBCUTANEOUS | Status: DC
  Administered 2019-11-25 – 2019-12-15 (×39): 5 [IU] via SUBCUTANEOUS
  Filled 2019-11-25 (×42): qty 0.05

## 2019-11-25 NOTE — Progress Notes (Signed)
Modified Barium Swallow Progress Note  Patient Details  Name: Corey Bennett MRN: [id number] Date of Birth: 23-Mar-1960  Today's Date: 11/25/2019  Modified Barium Swallow completed.  Full report located under Chart Review in the Imaging Section.  Brief recommendations include the following:  Clinical Impression    Pt presents with severe oropharyngeal dysphagia due to a multitude of deficits that remained fairly consistent from most recent MBS on 11/03/19. Pt's PMV was in place during the study. Pt demonstrated frank sensed aspiration before, during and after the swallow of liquids (honey thick, nectar thick and thin liquids) via TSP. Trace silent aspiration occurred after the swallow secondary to residue. Pt's oral phase was marked by mild anterior spillage, oral residue (which required suctioning by SLP), limited lingual range of motion, piecemeal swallowing and premature spillage. Pharyngeal phase characterized by reduced base of tongue approximation to pharyngeal wall, pharyngeal swallow initiation at the pyriform sinuses, reduced pharyngeal peristalsis and reduced mobility of larynx contributing to incomplete epiglottic inversion.  Pt's cough was ineffective in clearing material from larynx. Chin tuck with nectar thick liquids via TSP was effective in improving laryngeal vestibule closure and no aspiration noted . Chin tuck was not beneficial with any other consistencies. Due to severity of dysphagia, SLP recommends to continue NPO status. Pt will continue with trials of nectar thick liquids via TSP utilizing chin tuck with SLP only. SLP should continue RMT to increase cough efficiency and will ice chips therapeutically to address goals. Recommends repeating MBS prior to initiating diet due conflicting s/s aspiration at bedside and during today's instrumental assessment.   Swallow Evaluation Recommendations   Recommended Consults: Consider ENT evaluation (question vocal cord paralysis)   SLP Diet  Recommendations: NPO;Alternative means - long-term       Medication Administration: Via alternative means       Compensations: Clear throat intermittently;Chin tuck (Nectar thick TSP trials using chin tuck with SLP only)   Postural Changes: Seated upright at 90 degrees   Oral Care Recommendations: Oral care QID   Other Recommendations: Have oral suction available    Diani Jillson 11/25/2019,12:56 PM

## 2019-11-25 NOTE — Progress Notes (Signed)
Occupational Therapy Session Note  Patient Details  Name: Corey Bennett MRN: [id number] Date of Birth: 12-08-1960  Today's Date: 11/25/2019 OT Individual Time: 0730-0800 OT Individual Time Calculation (min): 30 min   Session 2: OT Individual Time: 7564-3329 OT Individual Time Calculation (min): 73 min    Short Term Goals: Week 1:  OT Short Term Goal 1 (Week 1): Pt will tolerate therapeutic activity for ~15 min in un supported sitting to improved cardiopulmonary endurance OT Short Term Goal 2 (Week 1): Pt will perform sit to stands inADL tasks with contact guard OT Short Term Goal 3 (Week 1): Pt will don shirt with hemi dressing techniques with min A OT Short Term Goal 4 (Week 1): Pt will perform functional transfer (stand pivot with min A )  Skilled Therapeutic Interventions/Progress Updates:    Pt received supine, nodding yes to participation in OT session. Pt on RA and not on any supplemental O2/ humidified air- clarified with respiratory therapist need to remain on humidified air. PMSV applied. Pt required min A to transition from supine to EOB, with mod facilitation/cueing for attention to RUE. Pt sat EOB with good static sitting balance while OT briefly disconnected peg line from feed. Pt completed sit > stand with CGA with no AD and completed stand pivot transfer to the w/c with CGA. Pt refused to complete oral care at the sink- OT handed him toothbrush and he set it down shaking his head. He did brush his hair and wash his face. With supervision and cueing for double swallow (wet vocal quality throughout session) pt consumed 4 teaspoons of nectar thick water. Pt was left sitting up in his w/c on trach collar 5L O2 28% FiO2, chair alarm set.   Session 2: Pt received supine sleeping, easily awoken to vc. Pt shaking head yes to participation. PMSV on and pt off humidified air. Pt declined any ADLs. Min A for bed mobility to transition to EOB. He completed stand pivot transfer to the w/c with  min A. Pt was taken via w/c to the dayroom. He completed 5 ft of functional mobility with min A overall, poor RW management during turn to sit on mat and required increased assist to safely stand > sit. Pt sat EOM with kyphotic posture, requiring frequent cueing for sitting upright. He completed RUE NMR with focus on scapular retraction/protraction and elbow flex/ext. Pt then completed 2x 15 ft of functional mobility with min A and cueing required for RLE lifting and follow through, as well as RW management. Pt with increased fatigue and requiring extended rest break however all VSS. Pt with provided with an active rest break during which OT positioned therapy ball posteriorly and facilitated chest opening for increased lung and diaphragm expansion with cueing provided for deep breathing. Pt then completed standing throwing game with RUE secured on RW orthosis and midline crossing reach incorporated to encourage RUE/LE weightbearing. When standing pt often weightbearing almost completely on LLE with R knee flexed and offloaded. Pt attempting to communicate with OT, unable to write or verbalize request but after offering several suggestions he said "yes" to going back to his room. Pt completed stand pivot with min A to w/c. He was taken back to his room. He was left supine with trach collar on 5L O2 28% FiO2, bed alarm set.  Therapy Documentation Precautions:  Precautions Precautions: Fall Precaution Comments: trach, PEG, trach collar at 35% Restrictions Weight Bearing Restrictions: No   Therapy/Group: Individual Therapy  Crissie Reese 11/25/2019, 7:20  AM

## 2019-11-25 NOTE — Progress Notes (Signed)
[facility] PHYSICAL MEDICINE & REHABILITATION PROGRESS NOTE   Subjective/Complaints: No complaints  ROS: limited due to language/communication   Objective:   No results found. Recent Labs    11/23/19 1808  WBC 6.7  HGB 9.1*  HCT 27.6*  PLT 303   Recent Labs    11/23/19 1808  NA 137  K 3.7  CL 101  CO2 26  GLUCOSE 163*  BUN 26*  CREATININE 1.23  CALCIUM 8.8*    Intake/Output Summary (Last 24 hours) at 11/25/2019 1627 Last data filed at 11/25/2019 1203 Gross per 24 hour  Intake --  Output 2150 ml  Net -2150 ml        Physical Exam: Vital Signs Blood pressure 138/71, pulse 74, temperature 98.4 F (36.9 C), resp. rate 16, height 6\' 1"  (1.854 m), weight 71.4 kg, SpO2 99 %.  General: Alert, No apparent distress HEENT: Head is normocephalic, #6 trach Neck: Supple without JVD or lymphadenopathy Heart: Reg rate and rhythm. No murmurs rubs or gallops Chest: CTA bilaterally without wheezes, rales, or rhonchi; no distress Abdomen: non-distended, PEG snug agst belly, tender Extremities: No clubbing, cyanosis, or edema. Pulses are 2+ Skin: No evidence of breakdown, no evidence of rash Neurologic: expressive>receptive language deficits. Improved phonation. Unable to cough but more a language thing.  Cranial nerves II through XII intact, motor strength is 5/5 inleft deltoid, bicep, tricep, grip, hip flexor, knee extensors, ankle dorsiflexor and plantar flexor 3- RIght delt bi, tri, grip, 4- to 4/5 Right HF, KE, ADF, resting flexor tone RUE  Musculoskeletal: right shoulder Tender with ROM. RUE sensitive to touch.    Assessment/Plan: 1. 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  Physiatrist is providing close team supervision and 24 hour management of active medical problems listed below.  Physiatrist and rehab team continue to assess barriers to  discharge/monitor patient progress toward functional and medical goals  Care Tool:  Bathing    Body parts bathed by patient: Chest, Abdomen, Face, Right upper leg, Left upper leg, Right lower leg, Left lower leg, Right arm   Body parts bathed by helper: Front perineal area, Buttocks, Left arm     Bathing assist Assist Level: Moderate Assistance - Patient 50 - 74%     Upper Body Dressing/Undressing Upper body dressing   What is the patient wearing?: Pull over shirt    Upper body assist Assist Level: Moderate Assistance - Patient 50 - 74%    Lower Body Dressing/Undressing Lower body dressing      What is the patient wearing?: Underwear/pull up, Pants     Lower body assist Assist for lower body dressing: Maximal Assistance - Patient 25 - 49%     Toileting Toileting Toileting Activity did not occur (Clothing management and hygiene only): N/A (no void or bm)  Toileting assist Assist for toileting: Total Assistance - Patient < 25%     Transfers Chair/bed transfer  Transfers assist     Chair/bed transfer assist level: Minimal Assistance - Patient > 75%     Locomotion Ambulation   Ambulation assist   Ambulation activity did not occur: Safety/medical concerns  Assist level: 2 helpers Assistive device: Walker-rolling (+2 WC Follow) Max distance: 40'   Walk 10 feet activity   Assist  Walk 10 feet activity did not occur: Safety/medical concerns  Assist level: 2 helpers Assistive device: Walker-rolling (+2 WC follow)   Walk 50 feet activity  Assist Walk 50 feet with 2 turns activity did not occur: Safety/medical concerns         Walk 150 feet activity   Assist Walk 150 feet activity did not occur: Safety/medical concerns         Walk 10 feet on uneven surface  activity   Assist Walk 10 feet on uneven surfaces activity did not occur: Safety/medical concerns         Wheelchair     Assist Will patient use wheelchair at discharge?:  Yes Type of Wheelchair: Manual Wheelchair activity did not occur: Safety/medical concerns         Wheelchair 50 feet with 2 turns activity    Assist    Wheelchair 50 feet with 2 turns activity did not occur: Safety/medical concerns       Wheelchair 150 feet activity     Assist  Wheelchair 150 feet activity did not occur: Safety/medical concerns       Blood pressure 138/71, pulse 74, temperature 98.4 F (36.9 C), resp. rate 16, height 6\' 1"  (1.854 m), weight 71.4 kg, SpO2 99 %.    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 Antithrombotics: -DVT/anticoagulation:SCDs -antiplatelet therapy: Aspirin 81 mg daily and Plavix 75 mg daily 3. Pain Management:Tylenol as needed  -add gabapentin 100mg  tid for neuropathic pain RUE  -support hemiplegic shoulder  -tone mgt  -well controlled 4. Mood:Provide emotional support -antipsychotic agents: Seroquel 50 mg nightly 5. Neuropsych: This patientis notcapable of making decisions on hisown behalf. 6. Skin/Wound Care:Routine skin checks 7. Fluids/Electrolytes/Nutrition:Routine in and outs with follow-up chemistries 8. Seizure disorder. Dilantin 175 mg 3 times daily, Keppra 1000 mg twice daily, Vimpat 150 mg twice daily. Follow-up neurology services 9. Diabetes mellitus. Hemoglobin A1c 7.2. Lantus insulin 6 units nightly, NovoLog 2 units every 4 hours. CBG (last 3)  Recent Labs    11/25/19 0841 11/25/19 1118 11/25/19 1555  GLUCAP 196* 178* 138*  9/22: Uncontrolled: increase lantus to 5U BID 10. Dysphagia. NPO except for trials with SLP. Status post gastrostomy tube 11/10/2019 per interventional radiology. advance per SLP 11. Hypertension. Hydralazine 50 mg every 8 hours, Cardura  1 mg daily, Norvasc 10 mg daily.    -well controlled Vitals:   11/25/19 1312 11/25/19 1539  BP: 138/71   Pulse: 77 74  Resp: 19 16  Temp: 98.4 F (36.9 C)   SpO2: 99% 99%    12. Acute on chronic respiratory failure. Status post tracheostomy tube. Presently with a #6 Shiley flexible cuffless trach as of 11/02/2019 followed by pulmonary services Dr. 11/27/19  -plan is to continue trach and f/u with pulmonary in trach clinic 4-6 weeks  13. Hyperlipidemia. Lipitor 14. Urinary retention. Urecholine 15 mg 3 times daily. Check PVR   9/20 removed foley   9/21 continue voiding trial, I/O cath prn LOS: 4 days A FACE TO FACE EVALUATION WAS PERFORMED  10/20 Kennita Pavlovich 11/25/2019, 4:27 PM

## 2019-11-25 NOTE — Progress Notes (Signed)
Speech Language Pathology Daily Session Note  Patient Details  Name: Corey Bennett MRN: [id number] Date of Birth: 07-14-1960  Today's Date: 11/25/2019 SLP Individual Time: 1030-1055 SLP Individual Time Calculation (min): 25 min  Short Term Goals: Week 1: SLP Short Term Goal 1 (Week 1): Pt will consume therapeutic trials of thin liquids via teaspoon with minimal overt s/s of aspiration and mod assist verbal cues for use of swallowing precautions. SLP Short Term Goal 2 (Week 1): Pt will use multimodal means of communication (picture exchange system, gestures, verbalization) to convey immediate, basic needs and wants with mod assist multimodal cues. SLP Short Term Goal 3 (Week 1): Pt will repeat 2-3 word functional phrases with max assist multimodal cues to recognize and correct verbal errors. SLP Short Term Goal 4 (Week 1): Pt will wear speaking valve during all waking hours without reports of changes in vital signs or discomfort with mod I.  Skilled Therapeutic Interventions: Skilled treatment session focused on speech goals. Upon arrival, patient was awake but appeared lethargic while upright in the wheelchair. Patient's PMSV was in place and all vitals remained WFL. Patient demonstrated an intermittent wet cough, suspect due to recent MBS, therefore, RT was called for deep suctioning. Patient declined trials as well as working on communication and consistently kept gesturing to the bed. Patient transferred to the bed with overall Min A. Patient left upright in bed with alarm on and all needs within reach. Continue with current plan of care.      Pain No indications of pain  Therapy/Group: Individual Therapy  Estefania Kamiya 11/25/2019, 12:51 PM

## 2019-11-25 NOTE — Progress Notes (Signed)
Physical Therapy Session Note  Patient Details  Name: Corey Bennett MRN: [id number] Date of Birth: [birth year]  Today's Date: 11/25/2019 PT Individual Time: 1615-1700 PT Individual Time Calculation (min): 45 min   Short Term Goals: Week 1:  PT Short Term Goal 1 (Week 1): Pt will perform least restrictive transfer with min A consistently PT Short Term Goal 2 (Week 1): Pt will initiate gait training PT Short Term Goal 3 (Week 1): Pt will tolerate sitting out of bed x 1 hour  Skilled Therapeutic Interventions/Progress Updates:    Pt received seated in bed asleep, arousable and agreeable to PT session. Pt has pain in RUE with mobility, no other complaints of pain and no pain at rest. Bed mobility Supervision with increased time needed. Stand pivot transfer bed to/from w/c with RW and min A. Dependent transport via w/c to/from therapy gym for time conservation. Standing alt L/R 4" step-taps progressing to step up s with RW and min A for balance. Pt able to perform 2 x 7 reps to fatigue. Pt on room air throughout session, SpO2 at 95% and above. Pt requests to return to bed at end of session. Pt left seated in bed with needs in reach, bed alarm in place.  Therapy Documentation Precautions:  Precautions Precautions: Fall Precaution Comments: trach, PEG, trach collar at 35% Restrictions Weight Bearing Restrictions: No    Therapy/Group: Individual Therapy   Peter Congo, PT, DPT  11/25/2019, 5:42 PM

## 2019-11-26 ENCOUNTER — Inpatient Hospital Stay (HOSPITAL_COMMUNITY): Payer: BC Managed Care – PPO | Admitting: Physical Therapy

## 2019-11-26 ENCOUNTER — Inpatient Hospital Stay (HOSPITAL_COMMUNITY): Payer: BC Managed Care – PPO | Admitting: Speech Pathology

## 2019-11-26 ENCOUNTER — Encounter: Payer: BC Managed Care – PPO | Admitting: Occupational Therapy

## 2019-11-26 ENCOUNTER — Inpatient Hospital Stay (HOSPITAL_COMMUNITY): Payer: BC Managed Care – PPO | Admitting: Occupational Therapy

## 2019-11-26 LAB — GLUCOSE, CAPILLARY
Glucose-Capillary: 148 mg/dL — ABNORMAL HIGH (ref 70–99)
Glucose-Capillary: 160 mg/dL — ABNORMAL HIGH (ref 70–99)
Glucose-Capillary: 175 mg/dL — ABNORMAL HIGH (ref 70–99)
Glucose-Capillary: 180 mg/dL — ABNORMAL HIGH (ref 70–99)
Glucose-Capillary: 205 mg/dL — ABNORMAL HIGH (ref 70–99)
Glucose-Capillary: 211 mg/dL — ABNORMAL HIGH (ref 70–99)
Glucose-Capillary: 78 mg/dL (ref 70–99)

## 2019-11-26 MED ORDER — DOXAZOSIN MESYLATE 2 MG PO TABS
2.0000 mg | ORAL_TABLET | Freq: Every day | ORAL | Status: DC
  Administered 2019-11-27 – 2019-12-03 (×7): 2 mg
  Filled 2019-11-26 (×7): qty 1

## 2019-11-26 NOTE — Progress Notes (Signed)
Physical Therapy Session Note  Patient Details  Name: Corey Bennett MRN: [id number] Date of Birth: 1960-03-19  Today's Date: 11/26/2019 PT Individual Time:1500-1545   45 min   Short Term Goals: Week 1:  PT Short Term Goal 1 (Week 1): Pt will perform least restrictive transfer with min A consistently PT Short Term Goal 2 (Week 1): Pt will initiate gait training PT Short Term Goal 3 (Week 1): Pt will tolerate sitting out of bed x 1 hour Week 2:     Skilled Therapeutic Interventions/Progress Updates:   Pt received supine in bed and agreeable to PT. At bed level.  Supine NMR: SLRx 8, heel slide x 10 , hip abduction x 10. isometricHip adduction x 12 , SAQ x 10, ankle PF/DF x 20. Clam shells x 12 with level 2 tband; cues for full ROM and decreased eccentric speed throughout. PT attempted to have pt perform ankle PF with tband, but unable to coordinate movement.   PT performed prolonged stretch to R side wrist and digit flexors into extension 2 x 2 min hold each with increasing overpressure to end rang throughout. Noted 20 degree improvement in passive wrist extension following treatment. PT repositioned R arm to improve resting position to reduce tone.   Pt declined to perform any OOB activity at this time, requesting to rest. Left supine in bed with call bell in reach and all needs met        Therapy Documentation Precautions:  Precautions Precautions: Fall Precaution Comments: trach, PEG, trach collar at 35% Restrictions Weight Bearing Restrictions: No General:  missed time. 30 min. Pt fatigue. Unwilling to participate Vital Signs: Therapy Vitals Temp: 98.1 F (36.7 C) Pulse Rate: 73 Resp: 18 BP: (!) 142/60 Patient Position (if appropriate): Sitting Oxygen Therapy SpO2: 100 % O2 Device: Tracheostomy Collar Pain: Pain Assessment Pain Scale: Faces Faces Pain Scale: No hurt Pain Type: Neuropathic pain Pain Location: Arm Pain Orientation: Right Pain Descriptors / Indicators:  Guarding Pain Onset: With Activity Pain Intervention(s): Repositioned    Therapy/Group: Individual Therapy  Corey Bennett 11/26/2019, 3:25 PM

## 2019-11-26 NOTE — Progress Notes (Signed)
Speech Language Pathology Daily Session Note  Patient Details  Name: Corey Bennett MRN: [id number] Date of Birth: 1960/08/22  Today's Date: 11/26/2019 SLP Individual Time: 0805-0900 SLP Individual Time Calculation (min): 55 min  Short Term Goals: Week 1: SLP Short Term Goal 1 (Week 1): Pt will consume therapeutic trials of thin liquids via teaspoon with minimal overt s/s of aspiration and mod assist verbal cues for use of swallowing precautions. SLP Short Term Goal 2 (Week 1): Pt will use multimodal means of communication (picture exchange system, gestures, verbalization) to convey immediate, basic needs and wants with mod assist multimodal cues. SLP Short Term Goal 3 (Week 1): Pt will repeat 2-3 word functional phrases with max assist multimodal cues to recognize and correct verbal errors. SLP Short Term Goal 4 (Week 1): Pt will wear speaking valve during all waking hours without reports of changes in vital signs or discomfort with mod I.  Skilled Therapeutic Interventions:   Patient seen for skilled ST treatment with focus on dysphagia and language goals. Patient tolerated PMV without difficulty for entire session and is able to achieve consistent voicing and speak at word level with low vocal intensity. During dysphagia treatment, focus was on nectar thick liquids via spoon sips. Patient did not perform chin tuck posture (recommended on recent MBS) consistently. No cough, throat clearing or other overt s/s consistent with penetration were observed. Of note, patient's vocal quality was wet and gurgled prior to oral care completed by SLP as well as respiratory therapist who arrived during session. Wet vocal quality was not observed after oral care.  During speech and language therapy, patient was able to sustain vowel sounds when cued to do so for durations of 4-6 seconds, however vocal quality was mainly glottal. Patient performed automatic speech tasks of saying days of week, months of year, however  he had difficulty transitioning between different tasks and required moderate cues to redirect. Patient able to say or point to words/numbers printed to demonstrate orientation to current month and his birth year. He repeated 2-word phrases when printed on paper as well as modeled by SLP with min-mod A cues. After orientating patient to list of words to choose based on how he felt, he chose "tired" from field of 4. Patient continues to benefit from skilled SLP intervention to maximize swallow function and speech-language function prior to discharge.  Pain Pain Assessment Pain Scale: Faces Faces Pain Scale: No hurt Pain Type: Neuropathic pain Pain Location: Arm Pain Orientation: Right Pain Descriptors / Indicators: Guarding Pain Onset: With Activity Pain Intervention(s): Repositioned  Therapy/Group: Individual Therapy   Angela Nevin, MA, CCC-SLP 11/26/19 12:52 PM

## 2019-11-26 NOTE — Progress Notes (Addendum)
[place] PHYSICAL MEDICINE & REHABILITATION PROGRESS NOTE   Subjective/Complaints: Pt reports no new problems. "par for course"  ROS: limited due to language/communication   Objective:   No results found. Recent Labs    11/23/19 1808  WBC 6.7  HGB 9.1*  HCT 27.6*  PLT 303   Recent Labs    11/23/19 1808  NA 137  K 3.7  CL 101  CO2 26  GLUCOSE 163*  BUN 26*  CREATININE 1.23  CALCIUM 8.8*    Intake/Output Summary (Last 24 hours) at 11/26/2019 1118 Last data filed at 11/26/2019 0400 Gross per 24 hour  Intake --  Output 1300 ml  Net -1300 ml        Physical Exam: Vital Signs Blood pressure (!) 149/59, pulse 72, temperature 97.6 F (36.4 C), resp. rate 17, height 6\' 1"  (1.854 m), weight 70.7 kg, SpO2 100 %.  Constitutional: No distress . Vital signs reviewed. HEENT: EOMI, oral membranes moist Neck: supple Cardiovascular: RRR without murmur. No JVD    Respiratory/Chest: CTA Bilaterally without wheezes or rales. Normal effort    GI/Abdomen: BS +, non-tender, non-distended Ext: no clubbing, cyanosis, or edema Psych: pleasant and cooperative Skin: No evidence of breakdown, no evidence of rash Neurologic: expressive>receptive language deficits. fair phonation. Apraxic.  Cranial nerves II through XII intact, motor strength is 5/5 inleft deltoid, bicep, tricep, grip, hip flexor, knee extensors, ankle dorsiflexor and plantar flexor 3- RIght delt bi, tri, grip, 4- to 4/5 Right HF, KE, ADF, resting flexor tone RUE  Musculoskeletal: right shoulder Tender with ROM. RUE appears much less sensitive to touch.    Assessment/Plan: 1. 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  Physiatrist is providing close team supervision and 24 hour management of active medical problems listed below.  Physiatrist and rehab team continue to assess barriers to  discharge/monitor patient progress toward functional and medical goals  Care Tool:  Bathing    Body parts bathed by patient: Chest, Abdomen, Face, Right upper leg, Left upper leg, Right lower leg, Left lower leg, Right arm   Body parts bathed by helper: Front perineal area, Buttocks, Left arm     Bathing assist Assist Level: Moderate Assistance - Patient 50 - 74%     Upper Body Dressing/Undressing Upper body dressing   What is the patient wearing?: Pull over shirt    Upper body assist Assist Level: Moderate Assistance - Patient 50 - 74%    Lower Body Dressing/Undressing Lower body dressing      What is the patient wearing?: Underwear/pull up, Pants     Lower body assist Assist for lower body dressing: Maximal Assistance - Patient 25 - 49%     Toileting Toileting Toileting Activity did not occur (Clothing management and hygiene only): N/A (no void or bm)  Toileting assist Assist for toileting: Total Assistance - Patient < 25%     Transfers Chair/bed transfer  Transfers assist     Chair/bed transfer assist level: Minimal Assistance - Patient > 75%     Locomotion Ambulation   Ambulation assist   Ambulation activity did not occur: Safety/medical concerns  Assist level: 2 helpers Assistive device: Walker-rolling (+2 WC Follow) Max distance: 40'   Walk 10 feet activity   Assist  Walk 10 feet activity did not occur: Safety/medical concerns  Assist level: 2 helpers Assistive device: Walker-rolling (+2 WC follow)   Walk 50 feet activity  Assist Walk 50 feet with 2 turns activity did not occur: Safety/medical concerns         Walk 150 feet activity   Assist Walk 150 feet activity did not occur: Safety/medical concerns         Walk 10 feet on uneven surface  activity   Assist Walk 10 feet on uneven surfaces activity did not occur: Safety/medical concerns         Wheelchair     Assist Will patient use wheelchair at discharge?:  Yes Type of Wheelchair: Manual Wheelchair activity did not occur: Safety/medical concerns         Wheelchair 50 feet with 2 turns activity    Assist    Wheelchair 50 feet with 2 turns activity did not occur: Safety/medical concerns       Wheelchair 150 feet activity     Assist  Wheelchair 150 feet activity did not occur: Safety/medical concerns       Blood pressure (!) 149/59, pulse 72, temperature 97.6 F (36.4 C), resp. rate 17, height 6\' 1"  (1.854 m), weight 70.7 kg, SpO2 100 %.    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 Antithrombotics: -DVT/anticoagulation:SCDs -antiplatelet therapy: Aspirin 81 mg daily and Plavix 75 mg daily 3. Pain Management:Tylenol as needed  -  gabapentin 100mg  tid for neuropathic pain RUE very beneficial  -support hemiplegic shoulder  -tone mgt  -well controlled 4. Mood:Provide emotional support -antipsychotic agents: Seroquel 50 mg nightly 5. Neuropsych: This patientis notcapable of making decisions on hisown behalf. 6. Skin/Wound Care:Routine skin checks 7. Fluids/Electrolytes/Nutrition:Routine in and outs with follow-up chemistries 8. Seizure disorder. Dilantin 175 mg 3 times daily, Keppra 1000 mg twice daily, Vimpat 150 mg twice daily. outpt neuro f/u 9. Diabetes mellitus. Hemoglobin A1c 7.2. Lantus insulin 6 units nightly, NovoLog 2 units every 4 hours. CBG (last 3)  Recent Labs    11/26/19 0000 11/26/19 0401 11/26/19 0904  GLUCAP 148* 180* 175*  9/23: control improving: increase lantus to 6U BID 10. Dysphagia. NPO except for trials with SLP. Status post gastrostomy tube 11/10/2019 per interventional radiology. advance per SLP 11. Hypertension. Hydralazine 50 mg every 8  hours, Cardura 1 mg daily, Norvasc 10 mg daily.    -reasonable control, increase doxasosin as below Vitals:   11/26/19 0328 11/26/19 0404  BP:  (!) 149/59  Pulse: 75 72  Resp: 20 17  Temp:  97.6 F (36.4 C)  SpO2: 99% 100%    12. Acute on chronic respiratory failure. Status post tracheostomy tube. Presently with a #6 Shiley flexible cuffless trach as of 11/02/2019 followed by pulmonary services Dr. 11/28/19  -plan is to continue trach and f/u with pulmonary in trach clinic 4-6 weeks   -PA spoke with brother who had questions in this regard 13. Hyperlipidemia. Lipitor 14. Urinary retention. Urecholine 15 mg 3 times daily. Check PVR   9/20 removed foley   9/21 continue voiding trial, I/O cath prn   9/23 increase doxasosin to 2mg  LOS: 5 days A FACE TO FACE EVALUATION WAS PERFORMED  10/21 11/26/2019, 11:18 AM

## 2019-11-26 NOTE — Progress Notes (Signed)
Occupational Therapy Session Note  Patient Details  Name: Corey Bennett MRN: [id number] Date of Birth: 1960-04-02  Today's Date: 11/26/2019 OT Individual Time: 1100-1205 OT Individual Time Calculation (min): 65 min    Short Term Goals: Week 1:  OT Short Term Goal 1 (Week 1): Pt will tolerate therapeutic activity for ~15 min in un supported sitting to improved cardiopulmonary endurance OT Short Term Goal 2 (Week 1): Pt will perform sit to stands inADL tasks with contact guard OT Short Term Goal 3 (Week 1): Pt will don shirt with hemi dressing techniques with min A OT Short Term Goal 4 (Week 1): Pt will perform functional transfer (stand pivot with min A )  Skilled Therapeutic Interventions/Progress Updates:    Pt received in bed and agreeable to therapy. He indicated he did want to sit on BSC.  He demonstrated good use of core musculature to move to sitting EOB with S, stood with CGA and then completed 2-3 steps to [facility].  Pt sat on [facility] for a considerable amount of time to try to toilet. During that time, he worked on B/d demonstrating good sitting balance as he actively reached to the floor and when crossing his legs.  Due to severe weakness and pain in RUE he is requiring mod A overall. Also pt has abdominal g tube, trach humidity support, continuous pulse ox on L hand so many lines to work around.   Pt tolerated sitting for 30 minutes but was unable to void.   Transferred back to EOB to work on RUE with retrograde massage (edema is significantly better), PROM for wrist and fingers, a/arom using UE Ranger. Pt tolerated more movement today. Pt requested to sit in w.c vs staying in bed. Stand pivot min A to standard chair.  Provided a R lap tray to support RUE.   Much improved participation and effort today.   Pt resting with all needs met.  Belt alarm on.   Therapy Documentation Precautions:  Precautions Precautions: Fall Precaution Comments: trach, PEG, trach collar at  35% Restrictions Weight Bearing Restrictions: No   Pain: Pain Assessment Faces Pain Scale: Hurts little more Pain Type: Neuropathic pain Pain Location: Arm Pain Orientation: Right Pain Descriptors / Indicators: Guarding Pain Onset: With Activity Pain Intervention(s): Repositioned ADL: ADL Eating: NPO Upper Body Bathing: Moderate assistance Where Assessed-Upper Body Bathing: Sitting at sink Lower Body Bathing: Moderate assistance Where Assessed-Lower Body Bathing: Sitting at sink Upper Body Dressing: Moderate assistance Where Assessed-Upper Body Dressing: Sitting at sink Lower Body Dressing: Maximal assistance Where Assessed-Lower Body Dressing: Sitting at sink Toileting: Maximal assistance Where Assessed-Toileting: Bedside Commode Toilet Transfer: Minimal assistance Toilet Transfer Method: Stand pivot Toilet Transfer Equipment: Bedside commode  Therapy/Group: Individual Therapy  [place] 11/26/2019, 12:28 PM

## 2019-11-27 ENCOUNTER — Inpatient Hospital Stay (HOSPITAL_COMMUNITY): Payer: BC Managed Care – PPO | Admitting: Speech Pathology

## 2019-11-27 ENCOUNTER — Inpatient Hospital Stay (HOSPITAL_COMMUNITY): Payer: BC Managed Care – PPO | Admitting: Occupational Therapy

## 2019-11-27 ENCOUNTER — Inpatient Hospital Stay (HOSPITAL_COMMUNITY): Payer: BC Managed Care – PPO | Admitting: Physical Therapy

## 2019-11-27 LAB — GLUCOSE, CAPILLARY
Glucose-Capillary: 121 mg/dL — ABNORMAL HIGH (ref 70–99)
Glucose-Capillary: 123 mg/dL — ABNORMAL HIGH (ref 70–99)
Glucose-Capillary: 137 mg/dL — ABNORMAL HIGH (ref 70–99)
Glucose-Capillary: 170 mg/dL — ABNORMAL HIGH (ref 70–99)
Glucose-Capillary: 171 mg/dL — ABNORMAL HIGH (ref 70–99)

## 2019-11-27 NOTE — Progress Notes (Signed)
Occupational Therapy Session Note  Patient Details  Name: Corey Bennett MRN: [id number] Date of Birth: June 10, 1960  Today's Date: 11/27/2019 OT Individual Time: 1419-1530 OT Individual Time Calculation (min): 71 min    Short Term Goals: Week 1:  OT Short Term Goal 1 (Week 1): Pt will tolerate therapeutic activity for ~15 min in un supported sitting to improved cardiopulmonary endurance OT Short Term Goal 2 (Week 1): Pt will perform sit to stands inADL tasks with contact guard OT Short Term Goal 3 (Week 1): Pt will don shirt with hemi dressing techniques with min A OT Short Term Goal 4 (Week 1): Pt will perform functional transfer (stand pivot with min A )  Skilled Therapeutic Interventions/Progress Updates:    Pt completed supine to sit EOB with supervision and then transferred to the wheelchair with min assist.  He was taken down the therapy gym where he transferred to the therapy mat at min assist level.  Therapist focused on RUE and posture during session.  Therapist began with scapular mobilizations in sitting with progression down to digit mobilizations as well secondary to tightness with attempted stretching of digit flexors.  Therapist also facilitated bilateral scapular adduction as pt demonstrates severe rounding posture and cervical flexion.  Placed the RUE in weightbearing on the mat with pt instructed to reach across his body to the right to pick up and place clothespins with the LUE.  Mod assist for balance at times with pt grimacing and shaking his head when he was asked if he was having pain in his arm.  He responds to palpitation at the [facility] joint.  Transitioned to working on active movements of shoulder flexion on a tilted stool but this was too painful as well.  Therapist placed therapy ball under pt's right forearm for work on simple shoulder flexion movements with the arm more supported.  He was able to exhibit some movement on the ball but still with some improved pain but decreased.   Finished session with transfer back to the wheelchair and then back to the room.  He transferred back to the bed with min assist to complete session.  Bed alarm in place and call button and phone in reach.    Therapy Documentation Precautions:  Precautions Precautions: Fall Precaution Comments: trach, PEG, trach collar at 28% Restrictions Weight Bearing Restrictions: No  Pain: Pain Assessment Pain Scale: Faces Faces Pain Scale: Hurts little more Pain Type: Chronic pain Pain Location: Arm Pain Orientation: Right Pain Descriptors / Indicators: Aching;Discomfort;Grimacing Pain Onset: With Activity Pain Intervention(s): Repositioned;Emotional support ADL: See Care Tool Section for some details of mobility and selfcare  Therapy/Group: Individual Therapy  Antonio Creswell OTR/L 11/27/2019, 6:12 PM

## 2019-11-27 NOTE — Progress Notes (Signed)
Physical Therapy Session Note  Patient Details  Name: Corey Bennett MRN: [id number] Date of Birth: 08/06/1960  Today's Date: 11/27/2019 PT Individual Time: 1007-1050 PT Individual Time Calculation (min): 43 min   Short Term Goals: Week 1:  PT Short Term Goal 1 (Week 1): Pt will perform least restrictive transfer with min A consistently PT Short Term Goal 2 (Week 1): Pt will initiate gait training PT Short Term Goal 3 (Week 1): Pt will tolerate sitting out of bed x 1 hour  Skilled Therapeutic Interventions/Progress Updates: Pt presents semi-reclined in bed, receiving care from RN.  Pt reluctantly agrees to participate in therapy, but thankful at conclusion.  Pt required assist to thread pants over feet, ut then pulls them up to hips and bridges to pull over hips.  Pt transfers sup to sit w/ supervision using siderail and scoots to EOB.  P transfers sit to stand w/ min to CGA w/ bed elevated. Pt assists w/ placement of R hand in strap on RW.  Pt performed standing marching w/ noted retropulsion and L lean.  Pt encouraged to weight-shift to R LE.  Pt  Ambulated multiple trials forward and back 3-5' w/ RW and mod A, verbal cues for sequencing and placement of RLE, as well as to [facility].  Pt required seated rest breaks between trials of standing/balance.  Pt sat EOB w/o UE support.  Pt performed seated calf raises, LAQ w/ stretch at end range of knee extension to increase ROM.  Pt states pain/tightness to HS.  Pt unable to continue OOB/EOB activity 2/2 fatigue.  O2 sats decreased during activity to 92% on 5 LPM through trach mask, but quickly recovered to 98% .  Discussed need for rest during therapy, but to keep pushing, appears to understand.  Pt transferred sit to supine w/ supervision.  Bed alarm on and all needs in reach.     Therapy Documentation Precautions:  Precautions Precautions: Fall Precaution Comments: trach, PEG, trach collar at 35% Restrictions Weight Bearing Restrictions: No General: PT  Amount of Missed Time (min): 17 Minutes PT Missed Treatment Reason: Patient fatigue Vital Signs: Therapy Vitals Pulse Rate: 72 Resp: 17 Patient Position (if appropriate): Lying Oxygen Therapy SpO2: 99 % O2 Device: Tracheostomy Collar O2 Flow Rate (L/min): 5 L/min FiO2 (%): 21 % Pain: unsure of pain, although winces w/ PEG tube movement.    Therapy/Group: Individual Therapy  Lucio Edward 11/27/2019, 10:56 AM

## 2019-11-27 NOTE — Progress Notes (Signed)
[place] PHYSICAL MEDICINE & REHABILITATION PROGRESS NOTE   Subjective/Complaints: Up in bed. Denies any new complaints.   ROS: limited due to language/communication   Objective:   No results found. No results for input(s): WBC, HGB, HCT, PLT in the last 72 hours. No results for input(s): NA, K, CL, CO2, GLUCOSE, BUN, CREATININE, CALCIUM in the last 72 hours.  Intake/Output Summary (Last 24 hours) at 11/27/2019 1205 Last data filed at 11/26/2019 2339 Gross per 24 hour  Intake --  Output 600 ml  Net -600 ml        Physical Exam: Vital Signs Blood pressure 139/64, pulse 73, temperature 97.9 F (36.6 C), resp. rate 17, height 6\' 1"  (1.854 m), weight 71.3 kg, SpO2 99 %.  Constitutional: No distress . Vital signs reviewed. HEENT: EOMI, oral membranes moist Neck: supple Cardiovascular: RRR without murmur. No JVD    Respiratory/Chest: CTA Bilaterally without wheezes or rales. Normal effort    GI/Abdomen: BS +, non-tender, non-distended Ext: no clubbing, cyanosis, or edema Psych: pleasant and cooperative Skin: No evidence of breakdown, no evidence of rash Neurologic: expressive>receptive language deficits. fair phonation. Apraxic.  Cranial nerves II through XII intact, motor strength is 5/5 inleft deltoid, bicep, tricep, grip, hip flexor, knee extensors, ankle dorsiflexor and plantar flexor 3- RIght delt bi, tri, grip, 4- to 4/5 Right HF, KE, ADF, resting flexor tone RUE  Musculoskeletal: RUE much less tender. No substantial subluxation appreciated   Assessment/Plan: 1. 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  Physiatrist is providing close team supervision and 24 hour management of active medical problems listed below.  Physiatrist and rehab team continue to assess barriers to discharge/monitor patient progress toward functional and medical goals  Care  Tool:  Bathing    Body parts bathed by patient: Chest, Abdomen, Face, Right upper leg, Left upper leg, Right lower leg, Left lower leg, Right arm   Body parts bathed by helper: Front perineal area, Buttocks, Left arm     Bathing assist Assist Level: Moderate Assistance - Patient 50 - 74%     Upper Body Dressing/Undressing Upper body dressing   What is the patient wearing?: Pull over shirt    Upper body assist Assist Level: Moderate Assistance - Patient 50 - 74%    Lower Body Dressing/Undressing Lower body dressing      What is the patient wearing?: Underwear/pull up, Pants     Lower body assist Assist for lower body dressing: Maximal Assistance - Patient 25 - 49%     Toileting Toileting Toileting Activity did not occur (Clothing management and hygiene only): N/A (no void or bm)  Toileting assist Assist for toileting: Maximal Assistance - Patient 25 - 49%     Transfers Chair/bed transfer  Transfers assist     Chair/bed transfer assist level: Minimal Assistance - Patient > 75%     Locomotion Ambulation   Ambulation assist   Ambulation activity did not occur: Safety/medical concerns  Assist level: Moderate Assistance - Patient 50 - 74% Assistive device: Walker-rolling Max distance: 10   Walk 10 feet activity   Assist  Walk 10 feet activity did not occur: Safety/medical concerns  Assist level: Moderate Assistance - Patient - 50 - 74% Assistive device: Walker-rolling   Walk 50 feet activity   Assist Walk 50 feet with 2 turns activity did not occur: Safety/medical concerns         Walk  150 feet activity   Assist Walk 150 feet activity did not occur: Safety/medical concerns         Walk 10 feet on uneven surface  activity   Assist Walk 10 feet on uneven surfaces activity did not occur: Safety/medical concerns         Wheelchair     Assist Will patient use wheelchair at discharge?: Yes Type of Wheelchair: Manual Wheelchair  activity did not occur: Safety/medical concerns         Wheelchair 50 feet with 2 turns activity    Assist    Wheelchair 50 feet with 2 turns activity did not occur: Safety/medical concerns       Wheelchair 150 feet activity     Assist  Wheelchair 150 feet activity did not occur: Safety/medical concerns       Blood pressure 139/64, pulse 73, temperature 97.9 F (36.6 C), resp. rate 17, height 6\' 1"  (1.854 m), weight 71.3 kg, SpO2 99 %.    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 Antithrombotics: -DVT/anticoagulation:SCDs -antiplatelet therapy: Aspirin 81 mg daily and Plavix 75 mg daily 3. Pain Management:Tylenol as needed  -  gabapentin 100mg  tid for neuropathic pain RUE very beneficial  -support hemiplegic shoulder  -tone mgt  -improved control presently 4. Mood:Provide emotional support -antipsychotic agents: Seroquel 50 mg nightly 5. Neuropsych: This patientis notcapable of making decisions on hisown behalf. 6. Skin/Wound Care:Routine skin checks 7. Fluids/Electrolytes/Nutrition:Routine in and outs with follow-up chemistries 8. Seizure disorder. Dilantin 175 mg 3 times daily, Keppra 1000 mg twice daily, Vimpat 150 mg twice daily. outpt neuro f/u  -dilantin level ordered for tomorrow 9. Diabetes mellitus. Hemoglobin A1c 7.2. Lantus insulin 6 units nightly, NovoLog 2 units every 4 hours. CBG (last 3)  Recent Labs    11/26/19 2338 11/27/19 0413 11/27/19 1134  GLUCAP 205* 170* 123*  9/24 control gradually improving: increase lantus to 6U BID yesterday--observe for results 10. Dysphagia. NPO except for trials with SLP. Status post gastrostomy tube 11/10/2019 per interventional radiology. advance per SLP 11.  Hypertension. Hydralazine 50 mg every 8 hours, Cardura 1 mg daily, Norvasc 10 mg daily.    -good control, increase doxasosin as below Vitals:   11/27/19 0734 11/27/19 1129  BP:    Pulse: 72 73  Resp: 17 17  Temp:    SpO2: 99% 99%    12. Acute on chronic respiratory failure. Status post tracheostomy tube. Presently with a #6 Shiley flexible cuffless trach as of 11/02/2019 followed by pulmonary services Dr. 11/29/19  -plan is to continue trach and f/u with pulmonary in trach clinic 4-6 weeks   -PA spoke with brother who had questions in this regard 13. Hyperlipidemia. Lipitor 14. Urinary retention. Urecholine 15 mg 3 times daily.      9/24 continue voiding trial, still some larger volumes       -doxasosin  2mg    -I/o cath prn LOS: 6 days A FACE TO FACE EVALUATION WAS PERFORMED  Levon Hedger 11/27/2019, 12:05 PM

## 2019-11-27 NOTE — Progress Notes (Signed)
Speech Language Pathology Weekly Progress and Session Note  Patient Details  Name: Corey Bennett MRN: [id number] Date of Birth: [birth year]  Beginning of progress report period: November 22, 2019 End of progress report period: November 27, 2019  Today's Date: 11/27/2019 SLP Individual Time: 0805-0900 SLP Individual Time Calculation (min): 55 min  Short Term Goals: Week 1: SLP Short Term Goal 1 (Week 1): Pt will consume therapeutic trials of thin liquids via teaspoon with minimal overt s/s of aspiration and mod assist verbal cues for use of swallowing precautions. SLP Short Term Goal 1 - Progress (Week 1): Progressing toward goal SLP Short Term Goal 2 (Week 1): Pt will use multimodal means of communication (picture exchange system, gestures, verbalization) to convey immediate, basic needs and wants with mod assist multimodal cues. SLP Short Term Goal 2 - Progress (Week 1): Met SLP Short Term Goal 3 (Week 1): Pt will repeat 2-3 word functional phrases with max assist multimodal cues to recognize and correct verbal errors. SLP Short Term Goal 3 - Progress (Week 1): Met SLP Short Term Goal 4 (Week 1): Pt will wear speaking valve during all waking hours without reports of changes in vital signs or discomfort with mod I. SLP Short Term Goal 4 - Progress (Week 1): Met    New Short Term Goals: Week 2: SLP Short Term Goal 1 (Week 2): Patient will tolerate teaspoon size nectar thick liquid trials without overt s/s of aspiration/penetration with supervisionA SLP Short Term Goal 2 (Week 2): Patient will imitate to produce 3-4 word functional phrases with modA. SLP Short Term Goal 3 (Week 2): Patient will utilize 4-6 cell picture/word communication board to comment/request during structured tasks with SLP with minA SLP Short Term Goal 4 (Week 2): Patient will sustain phonation at phoneme level for 3-4 seconds duration with minA SLP Short Term Goal 5 (Week 2): Patient will verb/action photos/pictures in  field of two with 80% accuracy.  Weekly Progress Updates:  Patient met 3/4 STG's and was making progress towards goal he did not meet. He continues to be NPO with trials of nectar thick liquids via spoon sips with SLP only. He is tolerating PMV during sessions without difficulty. He has demonstrated improved accuracy with pointing object pictures when named in fields of 2-3 and has demonstrated some progress with pointing to object pictures when function described. He is able to phonate, respond verbally with yeah/yes and produce automatic speech such as days of week and months of year when given initial word prompt. Affect is flat and it is difficult to determine his mood, though he does show signs of frustration at times by looking away, responding quickly. He continues to benefit from skilled SLP intervention to maximize progress and work towards LTG's.   Intensity: Minumum of 1-2 x/day, 30 to 90 minutes Frequency: 3 to 5 out of 7 days Duration/Length of Stay: 3 weeks Treatment/Interventions: English as a second language teacher;Dysphagia/aspiration precaution training;Environmental controls;Patient/family education;Speech/Language facilitation;Internal/external aids;Multimodal communication approach   Daily Session  Skilled Therapeutic Interventions: Patient seen to address speech-language and dysphagia goals. He demonstrated swallow initiation with teaspoon sips of nectar thick liquids and consumed approximately 7-8 sips, however he did appear with congestion and SLP requested RN to tracheal suction him, which helped. Patient vocalized when cued/prompted but not spontaneously. He was able to point to object pictures when named in field of 3 with 100% accuracy, object function with 70% accuracy and verb/actions with 70% accuracy. He pointed to object words when named with 85% accuracy and pointed to  words to request for TV on/off, bed up/down. He started to appear frustrated at end of session, with participation  decreased somewhat and patient turning head away.    General    Pain Pain Assessment Pain Scale: 0-10 Pain Score: 0-No pain  Therapy/Group: Individual Therapy  Sonia Baller, MA, CCC-SLP 11/27/19 4:36 PM

## 2019-11-28 ENCOUNTER — Inpatient Hospital Stay (HOSPITAL_COMMUNITY): Payer: BC Managed Care – PPO | Admitting: Physical Therapy

## 2019-11-28 LAB — BASIC METABOLIC PANEL
Anion gap: 10 (ref 5–15)
BUN: 27 mg/dL — ABNORMAL HIGH (ref 6–20)
CO2: 27 mmol/L (ref 22–32)
Calcium: 8.7 mg/dL — ABNORMAL LOW (ref 8.9–10.3)
Chloride: 102 mmol/L (ref 98–111)
Creatinine, Ser: 1.26 mg/dL — ABNORMAL HIGH (ref 0.61–1.24)
GFR calc Af Amer: >60 mL/min (ref >60)
GFR calc non Af Amer: >60 mL/min (ref >60)
Glucose, Bld: 122 mg/dL — ABNORMAL HIGH (ref 70–99)
Potassium: 3.4 mmol/L — ABNORMAL LOW (ref 3.5–5.1)
Sodium: 139 mmol/L (ref 135–145)

## 2019-11-28 LAB — GLUCOSE, CAPILLARY
Glucose-Capillary: 139 mg/dL — ABNORMAL HIGH (ref 70–99)
Glucose-Capillary: 157 mg/dL — ABNORMAL HIGH (ref 70–99)
Glucose-Capillary: 158 mg/dL — ABNORMAL HIGH (ref 70–99)
Glucose-Capillary: 94 mg/dL (ref 70–99)
Glucose-Capillary: 153 mg/dL — ABNORMAL HIGH (ref 70–99)

## 2019-11-28 LAB — PHENYTOIN LEVEL, TOTAL: Phenytoin Lvl: 6.2 ug/mL — ABNORMAL LOW (ref 10.0–20.0)

## 2019-11-28 LAB — ALBUMIN: Albumin: 3 g/dL — ABNORMAL LOW (ref 3.5–5.0)

## 2019-11-28 MED ORDER — PHENYTOIN 125 MG/5ML PO SUSP
200.0000 mg | Freq: Three times a day (TID) | ORAL | Status: DC
  Administered 2019-11-28 – 2019-12-07 (×26): 200 mg
  Filled 2019-11-28 (×27): qty 8

## 2019-11-28 MED ORDER — PHENYTOIN 125 MG/5ML PO SUSP
300.0000 mg | Freq: Once | ORAL | Status: AC
  Administered 2019-11-28: 300 mg via ORAL
  Filled 2019-11-28: qty 12

## 2019-11-28 NOTE — Progress Notes (Signed)
MEDICATION RELATED CONSULT NOTE - FOLLOW UP  Pharmacy Consult for Phenytoin Indication: Seizures   No Known Allergies  Patient Measurements: Height: 6\' 1"  (185.4 cm) Weight: 72.9 kg (160 lb 11.5 oz) IBW/kg (Calculated) : 79.9  Vital Signs: Temp: 97.8 F (36.6 C) (09/25 0356) BP: 149/64 (09/25 0356) Pulse Rate: 71 (09/25 0759)   Intake/Output from previous day: 09/24 0701 - 09/25 0700 In: -  Out: 1400 [Urine:1400]   Intake/Output from this shift: No intake/output data recorded.  Labs: Recent Labs    11/28/19 0650 11/28/19 0715  CREATININE 1.26*  --   ALBUMIN  --  3.0*   Estimated Creatinine Clearance: 65.1 mL/min (A) (by C-G formula based on SCr of 1.26 mg/dL (H)).    Assessment: 59 yo male admitted with seizures. Last seizure noted 10/17/19. Previous 9/17 DPH level = 8.7, corrected 10.7 (alb 2.6) on dilantin suspension 175 mg per tube TID. Most recent DPH level on 9/25 resulted at 6.2, corrected to 8.9 (alb 3.0).   Goal phenytoin level 15-20 per MD's pharmacy consult order. No recent seizure activity and current DPH level of 8.9 mcg/ml is subtherapeutic. Will give a 300 mg loading dose once, then increase maintenance dose to 200 mg TID per tube.   Levels and dilantin dose adjustments this admission: 8/7 loaded with DPH (goal 15-20) and Keppra 8/7 DPH 1500mg  IV x1 then 100mg  Q8H (~4 mg/kg/d based on TBW; IBW 80 kg) 8/8 DPH level (post-load): 20.1 >> no adjustments, cont current dosing 8/9 Keppra dose reduced d/t renal fxn 750mg  IV q12h > 8/13 increased back to 1000mg  q12h 8/11 DPH 14.1, albumin 4.1 8/12 DPH 13.4.  Vimpat and Onfi added. 8/13 DPH 12.6, Albumin 2.5, corrected 21. Vimpat and Keppra increased d/t improved CrCl 8/15 DPH 7.5 > corrected = 12.5, free level ~0.95 >> 150mg  x1 then resume 100 Q8hr, renal fx worsening 8/17 DPH 4.3 (Corrects to 9.35 based on albumin from 8/18) - continue current dose per MD  8/18 Phenytoin level 3 adjusts to 8.4 8/24 Switched  from IV to PO  8/30 Phenytoin level 3.5 adjusted to 6 for albumin and renal function (goal 15-20)  9/4 Phenytoin level 3.8 adjusted to 5.9 for albumin and renal function  --Phenytoin level dropped significantly after switching to per tube administration due to binding by continuous tube feeds. Would be difficult to hold tube feeds 2hr before and after each dose. Could switch to nocturnal feeds but would have to increase TF rate significantly to 110 mL/hr to get the same nutrition.   --9/4 update: Discussed with MD to increase dose vs switch back to IV.  Per MD, increase dose and recheck levels. Dose increased, no reported seizures.  Will follow levels 9/10 DPH 6.3, corrected 9.5 (alb 2.8). Error when convert to IV. small bolus (150 mg IV) and recheck level in a few days > got 100 -150-100 IV for about 2 days 9/11 DPH 175 PT q8h since 10 pm on 9/10 9/17 DPH level = 8.7, corrected 10.7 (alb 2.6)  9/25 DPH level 6.2, corrected 8.9 (alb 3)  Goal of Therapy:  Phenytoin level 15-20 mcg/mL per MD's consult order Maintain seizure control  Plan:  - Give phenytoin 300 mg loading dose per tube x1 dose - 8 hours after loading dose, give increased maintenance dose at 200 mg TID per tube - Monitor for seizure occurrence  - Obtain further phenytoin levels when appropriate  11/4, PharmD, RPh  PGY-1 Pharmacy Resident 11/28/2019 10:24 AM  Please check  [email or web address] for unit-specific pharmacy phone numbers.

## 2019-11-29 ENCOUNTER — Inpatient Hospital Stay (HOSPITAL_COMMUNITY): Payer: BC Managed Care – PPO

## 2019-11-29 LAB — GLUCOSE, CAPILLARY
Glucose-Capillary: 128 mg/dL — ABNORMAL HIGH (ref 70–99)
Glucose-Capillary: 129 mg/dL — ABNORMAL HIGH (ref 70–99)
Glucose-Capillary: 130 mg/dL — ABNORMAL HIGH (ref 70–99)
Glucose-Capillary: 159 mg/dL — ABNORMAL HIGH (ref 70–99)
Glucose-Capillary: 248 mg/dL — ABNORMAL HIGH (ref 70–99)
Glucose-Capillary: 73 mg/dL (ref 70–99)

## 2019-11-29 MED ORDER — BETHANECHOL CHLORIDE 25 MG PO TABS
25.0000 mg | ORAL_TABLET | Freq: Three times a day (TID) | ORAL | Status: DC
  Administered 2019-11-29 – 2019-12-07 (×24): 25 mg
  Filled 2019-11-29 (×24): qty 1

## 2019-11-29 MED ORDER — POTASSIUM CHLORIDE 20 MEQ/15ML (10%) PO SOLN
20.0000 meq | Freq: Every day | ORAL | Status: DC
  Administered 2019-11-29 – 2019-12-15 (×15): 20 meq
  Filled 2019-11-29 (×17): qty 15

## 2019-11-29 NOTE — Progress Notes (Signed)
Physical Therapy Note  Patient Details  Name: Corey Bennett MRN: [id number] Date of Birth: 07-07-1960 Today's Date: 11/29/2019    Patient in bed asleep in the room upon PT arrival. Patient easily aroused to verbal and tactile stimulation. Patient required increased time to communicate due to speech deficits, however was able to communicate that he was very fatigued and did not feel up to participating in therapy this afternoon. Unable to provide a clear reason, however, he did manage to say that he was "sad and tired." Attempted to engage patient in conversation about emotions following stroke, however, patient declined discussing this at this time, asking to rest. Offered bed level exercises or getting OOB for toileting or improved sitting tolerance. Patient declined all offers at this time due to fatigue. Patient missed 60 min of skilled PT due to fatigue, RN made aware. Will attempt to make-up missed time as able. Patient in bed with breaks locked, bed alarm set, and all need in reach upon PT departure.     Shawnie Nicole L Chandra Asher PT, DPT  11/29/2019, 2:45 PM

## 2019-11-29 NOTE — Progress Notes (Signed)
Physical Therapy Session Note  Patient Details  Name: Corey Bennett MRN: [id number] Date of Birth: 08/29/1960  Today's Date: 11/28/2019 PT Individual Time:1300-1345   45 min   Short Term Goals: Week 1:  PT Short Term Goal 1 (Week 1): Pt will perform least restrictive transfer with min A consistently PT Short Term Goal 2 (Week 1): Pt will initiate gait training PT Short Term Goal 3 (Week 1): Pt will tolerate sitting out of bed x 1 hour  Skilled Therapeutic Interventions/Progress Updates:   Pt received supine in bed and agreeable to PT, but resistant. Supine>sit transfer with supervision assist and cues for awareness of the RUE. Pt performed 5xSTS: 28 sec (>15 sec indicates increased fall risk). Forward/reverse gait 2 x 5ft with min assist from PT and cues for proper step width on the R due to ataxia. Forward and lateral foot tap to 4in step with  BUE support on RW. Multiple prolonged rest break requested by Pt throughout session. Sit>supine completed with supervision and left supine in bed with call bell in reach and all needs met. Pt noted to take large exhale when performing 5xSTS and PMV fell on floor from pressure. Pt cleaned PMV with soap and water and left sitting on side table to air dry.         Therapy Documentation Precautions:  Precautions Precautions: Fall Precaution Comments: trach, PEG, trach collar at 28% Restrictions Weight Bearing Restrictions: No    Vital Signs: Therapy Vitals Temp: 98.1 F (36.7 C) Pulse Rate: 73 Resp: 18 BP: (!) 126/50 Patient Position (if appropriate): Lying Oxygen Therapy SpO2: 97 % O2 Device: Room Air Pain:   faces: no pain   Therapy/Group: Individual Therapy   E  11/29/2019, 5:08 AM  

## 2019-11-29 NOTE — Progress Notes (Signed)
[place] PHYSICAL MEDICINE & REHABILITATION PROGRESS NOTE   Subjective/Complaints: Pt comfortable in bed. Slept well. No new issues reported  ROS: limited due to language/communication   Objective:   No results found. No results for input(s): WBC, HGB, HCT, PLT in the last 72 hours. Recent Labs    11/28/19 0650  NA 139  K 3.4*  CL 102  CO2 27  GLUCOSE 122*  BUN 27*  CREATININE 1.26*  CALCIUM 8.7*    Intake/Output Summary (Last 24 hours) at 11/29/2019 1152 Last data filed at 11/29/2019 0430 Gross per 24 hour  Intake --  Output 1000 ml  Net -1000 ml        Physical Exam: Vital Signs Blood pressure (!) 126/50, pulse 73, temperature 98.1 F (36.7 C), resp. rate 18, height 6\' 1"  (1.854 m), weight 71.7 kg, SpO2 100 %.  Constitutional: No distress . Vital signs reviewed. HEENT: EOMI, oral membranes moist Neck: supple Cardiovascular: RRR without murmur. No JVD    Respiratory/Chest: CTA Bilaterally without wheezes or rales. Normal effort    GI/Abdomen: BS +, non-tender, non-distended Ext: no clubbing, cyanosis, or edema Psych: cooperative Skin: No evidence of breakdown, no evidence of rash Neurologic: phonates with cueing, expressive language deficits. . Apraxic.    motor strength is 5/5 inleft deltoid, bicep, tricep, grip, hip flexor, knee extensors, ankle dorsiflexor and plantar flexor 3- RIght delt bi, tri, grip, 4- to 4/5 Right HF, KE, ADF, mild resting flexor tone RUE  Musculoskeletal: RUE much less tender. No substantial subluxation appreciated   Assessment/Plan: 1. 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  Physiatrist is providing close team supervision and 24 hour management of active medical problems listed below.  Physiatrist and rehab team continue to assess barriers to discharge/monitor patient progress toward functional and medical  goals  Care Tool:  Bathing    Body parts bathed by patient: Chest, Abdomen, Face, Right upper leg, Left upper leg, Right lower leg, Left lower leg, Right arm   Body parts bathed by helper: Front perineal area, Buttocks, Left arm     Bathing assist Assist Level: Moderate Assistance - Patient 50 - 74%     Upper Body Dressing/Undressing Upper body dressing   What is the patient wearing?: Pull over shirt    Upper body assist Assist Level: Moderate Assistance - Patient 50 - 74%    Lower Body Dressing/Undressing Lower body dressing      What is the patient wearing?: Underwear/pull up, Pants     Lower body assist Assist for lower body dressing: Maximal Assistance - Patient 25 - 49%     Toileting Toileting Toileting Activity did not occur (Clothing management and hygiene only): N/A (no void or bm)  Toileting assist Assist for toileting: Maximal Assistance - Patient 25 - 49%     Transfers Chair/bed transfer  Transfers assist     Chair/bed transfer assist level: Minimal Assistance - Patient > 75%     Locomotion Ambulation   Ambulation assist   Ambulation activity did not occur: Safety/medical concerns  Assist level: Moderate Assistance - Patient 50 - 74% Assistive device: Walker-rolling Max distance: 10   Walk 10 feet activity   Assist  Walk 10 feet activity did not occur: Safety/medical concerns  Assist level: Moderate Assistance - Patient - 50 - 74% Assistive device: Walker-rolling   Walk 50 feet activity   Assist Walk 50 feet with 2  turns activity did not occur: Safety/medical concerns         Walk 150 feet activity   Assist Walk 150 feet activity did not occur: Safety/medical concerns         Walk 10 feet on uneven surface  activity   Assist Walk 10 feet on uneven surfaces activity did not occur: Safety/medical concerns         Wheelchair     Assist Will patient use wheelchair at discharge?: Yes Type of Wheelchair:  Manual Wheelchair activity did not occur: Safety/medical concerns         Wheelchair 50 feet with 2 turns activity    Assist    Wheelchair 50 feet with 2 turns activity did not occur: Safety/medical concerns       Wheelchair 150 feet activity     Assist  Wheelchair 150 feet activity did not occur: Safety/medical concerns       Blood pressure (!) 126/50, pulse 73, temperature 98.1 F (36.7 C), resp. rate 18, height 6\' 1"  (1.854 m), weight 71.7 kg, SpO2 100 %.    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 Antithrombotics: -DVT/anticoagulation:SCDs -antiplatelet therapy: Aspirin 81 mg daily and Plavix 75 mg daily 3. Pain Management:Tylenol as needed  -  gabapentin 100mg  tid for neuropathic pain RUE very beneficial  -support hemiplegic shoulder  -tone mgt  -improved pain control presently 4. Mood:Provide emotional support -antipsychotic agents: Seroquel 50 mg nightly 5. Neuropsych: This patientis notcapable of making decisions on hisown behalf. 6. Skin/Wound Care:Routine skin checks 7. Fluids/Electrolytes/Nutrition:Routine in and outs with follow-up chemistries 8. Seizure disorder. Dilantin 175 mg 3 times daily, Keppra 1000 mg twice daily, Vimpat 150 mg twice daily. outpt neuro f/u  -dilantin level still corrects to slightly below range  -appreciate pharmacy f/u  -dilantin dose increased to 200mg  tid with loading dose of 300mg  x 1 9/25 9. Diabetes mellitus. Hemoglobin A1c 7.2. Lantus insulin 6 units nightly, NovoLog 2 units every 4 hours. CBG (last 3)  Recent Labs    11/29/19 0401 11/29/19 0837 11/29/19 1137  GLUCAP 130* 129* 248*  9/26 control gradually improving: increase lantus to 7U BID today 10. Dysphagia. NPO  except for trials with SLP. Status post gastrostomy tube 11/10/2019 per interventional radiology. advance per SLP 11. Hypertension. Hydralazine 50 mg every 8 hours, Cardura 1 mg daily, Norvasc 10 mg daily.    -9/26 good control, increased doxasosin as below Vitals:   11/29/19 0409 11/29/19 0827  BP: (!) 126/50   Pulse: 73   Resp: 18   Temp: 98.1 F (36.7 C)   SpO2: 97% 100%    12. Acute on chronic respiratory failure. Status post tracheostomy tube. Presently with a #6 Shiley flexible cuffless trach as of 11/02/2019 followed by pulmonary services Dr. 12-18-1989  -plan is to continue trach and f/u with pulmonary in trach clinic 4-6 weeks   -PA spoke with brother last week who had questions in this regard 13. Hyperlipidemia. Lipitor 14. Urinary retention. Urecholine 15 mg 3 times daily.     -doxasosin  2mg    -I/o cath prn   9/26 volumes still high. Increase urecholine to 25mg  tid LOS: 8 days A FACE TO FACE EVALUATION WAS PERFORMED  12/01/19 11/29/2019, 11:52 AM

## 2019-11-30 ENCOUNTER — Inpatient Hospital Stay (HOSPITAL_COMMUNITY): Payer: BC Managed Care – PPO | Admitting: Physical Therapy

## 2019-11-30 ENCOUNTER — Inpatient Hospital Stay (HOSPITAL_COMMUNITY): Payer: BC Managed Care – PPO | Admitting: Occupational Therapy

## 2019-11-30 ENCOUNTER — Inpatient Hospital Stay (HOSPITAL_COMMUNITY): Payer: BC Managed Care – PPO

## 2019-11-30 LAB — GLUCOSE, CAPILLARY
Glucose-Capillary: 143 mg/dL — ABNORMAL HIGH (ref 70–99)
Glucose-Capillary: 164 mg/dL — ABNORMAL HIGH (ref 70–99)
Glucose-Capillary: 170 mg/dL — ABNORMAL HIGH (ref 70–99)
Glucose-Capillary: 230 mg/dL — ABNORMAL HIGH (ref 70–99)
Glucose-Capillary: 75 mg/dL (ref 70–99)
Glucose-Capillary: 78 mg/dL (ref 70–99)

## 2019-11-30 NOTE — Progress Notes (Addendum)
Physical Therapy Session Note  Patient Details  Name: Corey Bennett MRN: [id number] Date of Birth: Feb 08, 1961  Today's Date: 11/30/2019 PT Individual Time: 2725-3664 PT Individual Time Calculation (min): 45 min   Short Term Goals: Week 1:  PT Short Term Goal 1 (Week 1): Pt will perform least restrictive transfer with min A consistently PT Short Term Goal 2 (Week 1): Pt will initiate gait training PT Short Term Goal 3 (Week 1): Pt will tolerate sitting out of bed x 1 hour  Skilled Therapeutic Interventions/Progress Updates:  Pt received in bed & slowly awakened & agreeable to tx. PT dons PMSV but when pt has coughing episode PMSV comes off & nurse washes it with soap & water & speaking valve left on tray table to dry. Pt on room air throughout session & SpO2 >90% throughout, HR in 70s during session. Pt completes supine>sit with hospital bed features & mod assist to upright trunk, cuing to scoot hips to EOB. Pt incontinent of BM & transfers sit<>stand with min assist with PT providing max assist for managing R hand on hand orthosis on RW. Pt stands with CGA<>close supervision to allow pt to change briefs, perform peri hygiene dependently as pt declines to attempt. PT assists with threading & donning pants sitting/standing EOB. Short distance gait (~3 ft) bed>w/c with RW & min/mod assist with max cuing for sequencing turning & for increased safety. Gait x 15 ft within room with RW & min assist with PT providing anterior pelvic rotation RLE with cuing for increased step length RLE with pt demonstrating decreased RLE dorsiflexion during swing phase, decreased heel strike, inability to fully extend R knee in stance but no buckling noted. Pt noted anxiety during session & nurse administered meds but PEG tube leaking fluids & crushed meds during session - nurse made aware. Pt then begins leaning forward & making grimace, noting chest pain but HR 78 bpm, SpO2 >90% on room air & nurse entered & made aware of pt's  c/o. Pt reports to nurse that walking/activity was a lot. Pt declines further participation so pt left in w/c in care of nurse, for nursing staff to assist pt back to bed after they change bed sheets.  Addendum: PT provides max assist to don B thigh high ted hose & tennis shoes.  Therapy Documentation Precautions:  Precautions Precautions: Fall Precaution Comments: trach, PEG, trach collar at 28% Restrictions Weight Bearing Restrictions: No   General: PT Amount of Missed Time (min): 30 Minutes PT Missed Treatment Reason: Patient unwilling to participate;Patient fatigue   Therapy/Group: Individual Therapy  Sandi Mariscal 11/30/2019, 11:53 AM

## 2019-11-30 NOTE — Progress Notes (Signed)
Physical Therapy Note  Patient Details  Name: Corey Bennett MRN: [id number] Date of Birth: 1960/09/28 Today's Date: 11/30/2019    Patient in bed upon PT arrival. Attempted to make-up missed time from yesterday. Patient stated he was tired using PMSV, required total A to don/doff. Patient declined skilled interventions offered, including getting OOB, toileting, bed level exercises, gait training, and repositioning. patient requesting to rest at this time due to fatigue. Will continue to attempt to make-up missed time as able when patient is less fatigued.   Harleigh Civello L Fonnie Crookshanks PT, DPT  11/30/2019, 12:12 PM

## 2019-11-30 NOTE — Progress Notes (Signed)
Pt refused wrist splint this evening. He was educated on importance of wearing wrist splint while in bed. No other concerns to report.

## 2019-11-30 NOTE — Progress Notes (Signed)
Occupational Therapy Session Note  Patient Details  Name: Corey Bennett MRN: [id number] Date of Birth: 17-Jun-1960  Today's Date: 11/30/2019 OT Individual Time: 1400-1457 OT Individual Time Calculation (min): 57 min    Short Term Goals: Week 1:  OT Short Term Goal 1 (Week 1): Pt will tolerate therapeutic activity for ~15 min in un supported sitting to improved cardiopulmonary endurance OT Short Term Goal 2 (Week 1): Pt will perform sit to stands inADL tasks with contact guard OT Short Term Goal 3 (Week 1): Pt will don shirt with hemi dressing techniques with min A OT Short Term Goal 4 (Week 1): Pt will perform functional transfer (stand pivot with min A )   Skilled Therapeutic Interventions/Progress Updates:    Pt greeted at time of session reclined in bed resting comfortably, sleepy but agreeable to OT session. Minimal verbal communication throughout session, mostly communicating via hand gestures and simple yes/no phrases as his valve had been cleaned and was drying. Supine to sit EOB Min A with HOB elevated, donned shoes with Max A and difficulty with figure four d/t lack of flexibility but able to progress with static stretch, assist to tie laces. Seated endurance activity for reciprocal bike in room set up on his table for 3 rounds of 4 minute intervals with rest breaks in between to improve endurance. Sit to stands x2 trials with Min A, static standing for 45 sec-1 min intervals with some lateral weight shifting and cues to prevent R knee flexion. Seated AAROM/PROM techniques for patient's RUE at shoulder with scapular assist for flexion/extension, horizontal abd/add, elbow flex/ext, wrist and digit ROM as well with pt ed for performing self ROM in room and sponge squeeze/release, unclear carryover at this time but pt noted to perform sponge squeezes for strengthening when leaving the room. Pt seated scoot toward HOB with CGA, sit to supine CGA. Alarm on, call bell in reach.    Therapy  Documentation Precautions:  Precautions Precautions: Fall Precaution Comments: trach, PEG, trach collar at 28% Restrictions Weight Bearing Restrictions: No     Therapy/Group: Individual Therapy  [name] Score 11/30/2019, 2:38 PM

## 2019-11-30 NOTE — Progress Notes (Signed)
[facility] PHYSICAL MEDICINE & REHABILITATION PROGRESS NOTE   Subjective/Complaints: Patient appears more fatigued than when I last saw him. Sleeping but easily arousable. Missed some of PT this morning due to fatigue. Medications reviewed for sedating medications- in low dose Gabapentin, did not receive Seroquel or Xanax last night  ROS: limited due to language/communication   Objective:   No results found. No results for input(s): WBC, HGB, HCT, PLT in the last 72 hours. Recent Labs    11/28/19 0650  NA 139  K 3.4*  CL 102  CO2 27  GLUCOSE 122*  BUN 27*  CREATININE 1.26*  CALCIUM 8.7*    Intake/Output Summary (Last 24 hours) at 11/30/2019 1259 Last data filed at 11/30/2019 0010 Gross per 24 hour  Intake 120 ml  Output 1500 ml  Net -1380 ml        Physical Exam: Vital Signs Blood pressure (!) 154/63, pulse 78, temperature (!) 97.5 F (36.4 C), temperature source Oral, resp. rate 18, height 6\' 1"  (1.854 m), weight 71 kg, SpO2 99 %. General: Alert and oriented x 3, No apparent distress HEENT: Head is normocephalic, atraumatic, PERRLA, EOMI, sclera anicteric, oral mucosa pink and moist, dentition intact, ext ear canals clear,  Neck: Supple without JVD or lymphadenopathy Heart: Reg rate and rhythm. No murmurs rubs or gallops Chest: CTA bilaterally without wheezes, rales, or rhonchi; no distress Abdomen: Soft, non-tender, non-distended, bowel sounds positive. Extremities: No clubbing, cyanosis, or edema. Pulses are 2+ Psych: cooperative Skin: No evidence of breakdown, no evidence of rash Neurologic: phonates with cueing, expressive language deficits. . Apraxic.    motor strength is 5/5 inleft deltoid, bicep, tricep, grip, hip flexor, knee extensors, ankle dorsiflexor and plantar flexor 3- RIght delt bi, tri, grip, 4- to 4/5 Right HF, KE, ADF, mild resting flexor tone RUE  Musculoskeletal: RUE much less tender. No substantial subluxation  appreciated    Assessment/Plan: 1. 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  Physiatrist is providing close team supervision and 24 hour management of active medical problems listed below.  Physiatrist and rehab team continue to assess barriers to discharge/monitor patient progress toward functional and medical goals  Care Tool:  Bathing    Body parts bathed by patient: Chest, Abdomen, Face, Right upper leg, Left upper leg, Right lower leg, Left lower leg, Right arm   Body parts bathed by helper: Front perineal area, Buttocks, Left arm     Bathing assist Assist Level: Moderate Assistance - Patient 50 - 74%     Upper Body Dressing/Undressing Upper body dressing   What is the patient wearing?: Pull over shirt    Upper body assist Assist Level: Moderate Assistance - Patient 50 - 74%    Lower Body Dressing/Undressing Lower body dressing      What is the patient wearing?: Underwear/pull up, Pants     Lower body assist Assist for lower body dressing: Maximal Assistance - Patient 25 - 49%     Toileting Toileting Toileting Activity did not occur (Clothing management and hygiene only): N/A (no void or bm)  Toileting assist Assist for toileting: Maximal Assistance - Patient 25 - 49%     Transfers Chair/bed transfer  Transfers assist     Chair/bed transfer assist level: Minimal Assistance - Patient > 75%     Locomotion Ambulation   Ambulation assist   Ambulation activity did not occur: Safety/medical concerns  Assist  level: Minimal Assistance - Patient > 75% Assistive device: Walker-rolling Max distance: 15 ft   Walk 10 feet activity   Assist  Walk 10 feet activity did not occur: Safety/medical concerns  Assist level: Minimal Assistance - Patient > 75% Assistive device: Walker-rolling   Walk 50 feet activity   Assist Walk 50 feet with 2  turns activity did not occur: Safety/medical concerns         Walk 150 feet activity   Assist Walk 150 feet activity did not occur: Safety/medical concerns         Walk 10 feet on uneven surface  activity   Assist Walk 10 feet on uneven surfaces activity did not occur: Safety/medical concerns         Wheelchair     Assist Will patient use wheelchair at discharge?: Yes Type of Wheelchair: Manual Wheelchair activity did not occur: Safety/medical concerns         Wheelchair 50 feet with 2 turns activity    Assist    Wheelchair 50 feet with 2 turns activity did not occur: Safety/medical concerns       Wheelchair 150 feet activity     Assist  Wheelchair 150 feet activity did not occur: Safety/medical concerns       Blood pressure (!) 154/63, pulse 78, temperature (!) 97.5 F (36.4 C), temperature source Oral, resp. rate 18, height 6\' 1"  (1.854 m), weight 71 kg, SpO2 99 %.    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 Antithrombotics: -DVT/anticoagulation:SCDs -antiplatelet therapy: Aspirin 81 mg daily and Plavix 75 mg daily 3. Pain Management:Tylenol as needed  -  gabapentin 100mg  tid for neuropathic pain RUE very beneficial  -support hemiplegic shoulder  -tone mgt  -pain appears to be well controlled.  4. Mood:Provide emotional support -antipsychotic agents: Seroquel 50 mg nightly 5. Neuropsych: This patientis notcapable of making decisions on hisown behalf. 6. Skin/Wound Care:Routine skin checks 7. Fluids/Electrolytes/Nutrition:Routine in and outs with follow-up chemistries 8. Seizure disorder. Dilantin 175 mg 3 times daily, Keppra 1000 mg twice daily, Vimpat 150 mg twice daily. outpt neuro  f/u  -dilantin level still corrects to slightly below range  -appreciate pharmacy f/u  -dilantin dose increased to 200mg  tid with loading dose of 300mg  x 1 9/25 9. Diabetes mellitus. Hemoglobin A1c 7.2. Lantus insulin 6 units nightly, NovoLog 2 units every 4 hours. CBG (last 3)  Recent Labs    11/30/19 0432 11/30/19 0813 11/30/19 1145  GLUCAP 75 164* 230*  9/26 control gradually improving: increase lantus to 7U BID today 9/27: labile- continue to monitor.  10. Dysphagia. NPO except for trials with SLP. Status post gastrostomy tube 11/10/2019 per interventional radiology. advance per SLP 11. Hypertension. Hydralazine 50 mg every 8 hours, Cardura 1 mg daily, Norvasc 10 mg daily.    -9/26 good control, increased doxasosin as below Vitals:   11/30/19 0809 11/30/19 1201  BP:    Pulse: 80 78  Resp: 18 18  Temp:    SpO2: 98% 99%    12. Acute on chronic respiratory failure. Status post tracheostomy tube. Presently with a #6 Shiley flexible cuffless trach as of 11/02/2019 followed by pulmonary services Dr. 12-18-1989  -plan is to continue trach and f/u with pulmonary in trach clinic 4-6 weeks   -PA spoke with brother last week who had questions in this  regard 13. Hyperlipidemia. Lipitor 14. Urinary retention. Urecholine 15 mg 3 times daily.     -doxasosin  2mg    -I/o cath prn   9/26 volumes still high. Increase urecholine to 25mg  tid 15. Fatigue: ordered sleep chart.  LOS: 9 days A FACE TO FACE EVALUATION WAS PERFORMED  10/26 Jasmin Winberry 11/30/2019, 12:59 PM

## 2019-11-30 NOTE — Progress Notes (Signed)
Nutrition Follow-up  DOCUMENTATION CODES:   Non-severe (moderate) malnutrition in context of chronic illness  INTERVENTION:   Continue tube feeding via PEG: - Osmolite 1.5 @ 70 ml/hr to run over 20 hours (tube feeds can be held for up to 4 hours for therapies) - ProSource TF 45 ml BID - Free water flushes per MD/PA  Tube feeding regimen provides 2180 kcal, 110 grams of protein, and 1067 ml of H2O.   NUTRITION DIAGNOSIS:   Moderate Malnutrition related to chronic illness (stroke with dysphagia) as evidenced by mild fat depletion, moderate fat depletion, mild muscle depletion, moderate muscle depletion.  Ongoing  GOAL:   Patient will meet greater than or equal to 90% of their needs  Met via TF  MONITOR:   Diet advancement, Labs, Weight trends, TF tolerance, Skin, I & O's  REASON FOR ASSESSMENT:   Consult Enteral/tube feeding initiation and management  ASSESSMENT:   59 year old male with PMH of DM, HTN, HLD, left MCA CVA 06/2019 with residual aphasia and right-sided weakness as well as left CEA 09/2019. Presented 10/10/19 with seizure-like activity requiring intubation. CT/MRI showed large territory chronic hemorrhagic infarct in the left frontal lobe. Pt extubated 10/14/19 but required reintubation and tracheostomy performed 10/22/19. Currently NPO with G-tube placed 11/10/19. Admitted to CIR on 9/18.  9/22 - MBS with recommendations for NPO  Pt remains NPO with trials of nectar-thick liquids via spoon with SLP only.  Pt sleeping at time of RD visit. TF infusing as ordered.  Discussed pt with RN. RN reports pt tolerating TF well but occasionally having output from G-tube when it is disconnected from tube feeding for medication administration. RN reports chunky looking output from G-tube resembling curdled tube feeding formula. Will monitor this. Pt having bowel movements.  Weight fairly stable over the last week. Will continue to monitor for trends.  Medications reviewed and  include: pepcid, SSI q 4 hours, Novolog 2 units q 4 hours, lantus 5 units BID, dilantin, KCl 20 mEq daily, senokot  Labs reviewed: potassium 3.4, hemoglobin 9.1 CBG's: 75-230 x 24 hours  UOP: 1500 ml x 24 hours  Diet Order:   Diet Order            Diet NPO time specified  Diet effective midnight                 EDUCATION NEEDS:   No education needs have been identified at this time  Skin:  Skin Assessment: Reviewed RN Assessment  Last BM:  11/30/19  Height:   Ht Readings from Last 1 Encounters:  11/21/19 '6\' 1"'  (1.854 m)    Weight:   Wt Readings from Last 1 Encounters:  11/30/19 71 kg    Ideal Body Weight:  83.6 kg  BMI:  Body mass index is 20.65 kg/m.  Estimated Nutritional Needs:   Kcal:  2000-2200  Protein:  100-115 grams  Fluid:  >/= 2.0 L    Gaynell Face, MS, RD, LDN Inpatient Clinical Dietitian Please see [facility] for contact information.

## 2019-11-30 NOTE — Progress Notes (Signed)
Speech Language Pathology Daily Session Note  Patient Details  Name: Corey Bennett MRN: [id number] Date of Birth: 12-Sep-1960  Today's Date: 11/30/2019 SLP Individual Time: 0805-0900 SLP Individual Time Calculation (min): 55 min  Short Term Goals: Week 2: SLP Short Term Goal 1 (Week 2): Patient will tolerate teaspoon size nectar thick liquid trials without overt s/s of aspiration/penetration with supervisionA SLP Short Term Goal 2 (Week 2): Patient will imitate to produce 3-4 word functional phrases with modA. SLP Short Term Goal 3 (Week 2): Patient will utilize 4-6 cell picture/word communication board to comment/request during structured tasks with SLP with minA SLP Short Term Goal 4 (Week 2): Patient will sustain phonation at phoneme level for 3-4 seconds duration with minA SLP Short Term Goal 5 (Week 2): Patient will verb/action photos/pictures in field of two with 80% accuracy.  Skilled Therapeutic Interventions:Skilled ST services focused on swallow and language skills. SLP donned on/off PMSV throughout session due to O2/heart rate monitor demonstrating difficulty picking up sensor, SLP notified nurse. No air stacking noted. SLP preformed oral care prior to trials of nectar thick liquid via TSP. Pt required initial tactile cue to preform chin tuck, but faded to mod A verbal cues. Pt demonstrated delayed cough on initial trial, suggest due to pt moving from chin tuck position during second/piecemeal swallowing. Pt consumed x8 more trials with no overt s/s aspiration noted when chin tuck was used till the completion of the swallow. SLP facilitated receptive language skills, pt demonstrated ability to identify photo of common object in a field of 2 by named action in 2 out 5 opportunities and in 4 out 5 opportunities with max A demonstration cues of action. Pt demonstrated ability to point to named photo in a field of 2 in 9 out 10 opportunities, once comprehension was established. Pt expressed " I  hot one" point to blanket when SLP presented choice board. Pt demonstrated ability to communicate at word level " up", "off", "ok" when given 2 choices. Pt was left in room with call bell within reach and bed alarm set. SLP recommends to continue skilled services.     Pain Pain Assessment Pain Score: 0-No pain  Therapy/Group: Individual Therapy  Christy Ehrsam  [facility] 11/30/2019, 3:24 PM

## 2019-11-30 NOTE — Progress Notes (Signed)
Physical Therapy Weekly Progress Note  Patient Details  Name: Corey Bennett MRN: [id number] Date of Birth: 09/11/1960  Beginning of progress report period: November 22, 2019 End of progress report period: November 30, 2019  Today's Date: 11/30/2019  Patient has met 2 of 3 short term goals.  Pt is making slow progress towards LTG's. Pt currently requires min/mod assist for bed mobility with hospital bed features, transfers with min assist, short distance gait with RW & min assist. Pt demonstrates impaired neuromuscular control of trunk & RUE/RLE, impaired balance, impaired RLE/RUE strength, and impaired awareness. Pt with significantly decreased activity tolerance but on room air during sessions. Pt would benefit from continued skilled PT treatment to focus on deficits noted above/below, to increase independence prior to d/c.   Patient continues to demonstrate the following deficits muscle weakness, decreased cardiorespiratoy endurance and decreased oxygen support, decreased coordination and decreased motor planning, decreased attention, decreased awareness, decreased problem solving, decreased safety awareness and decreased memory, and decreased standing balance, decreased postural control, decreased balance strategies and R hemiparesis and therefore will continue to benefit from skilled PT intervention to increase functional independence with mobility.  Patient progressing toward long term goals..  Continue plan of care.  PT Short Term Goals Week 1:  PT Short Term Goal 1 (Week 1): Pt will perform least restrictive transfer with min A consistently PT Short Term Goal 1 - Progress (Week 1): Met PT Short Term Goal 2 (Week 1): Pt will initiate gait training PT Short Term Goal 2 - Progress (Week 1): Met PT Short Term Goal 3 (Week 1): Pt will tolerate sitting out of bed x 1 hour PT Short Term Goal 3 - Progress (Week 1): Progressing toward goal Week 2:  PT Short Term Goal 1 (Week 2): Pt will  consistently complete bed mobility with CGA. PT Short Term Goal 2 (Week 2): Pt will complete transfers with LRAD & CGA. PT Short Term Goal 3 (Week 2): Pt will ambulate 50 ft with LRAD & CGA.   Therapy Documentation Precautions:  Precautions Precautions: Fall Precaution Comments: trach, PEG, trach collar at 28% Restrictions Weight Bearing Restrictions: No  Therapy/Group: Individual Therapy  Waunita Schooner 11/30/2019, 11:57 AM

## 2019-12-01 ENCOUNTER — Inpatient Hospital Stay (HOSPITAL_COMMUNITY): Payer: BC Managed Care – PPO

## 2019-12-01 ENCOUNTER — Encounter: Payer: BC Managed Care – PPO | Admitting: Occupational Therapy

## 2019-12-01 ENCOUNTER — Inpatient Hospital Stay (HOSPITAL_COMMUNITY): Payer: BC Managed Care – PPO | Admitting: Occupational Therapy

## 2019-12-01 ENCOUNTER — Inpatient Hospital Stay (HOSPITAL_COMMUNITY): Payer: BC Managed Care – PPO | Admitting: Physical Therapy

## 2019-12-01 LAB — GLUCOSE, CAPILLARY
Glucose-Capillary: 109 mg/dL — ABNORMAL HIGH (ref 70–99)
Glucose-Capillary: 157 mg/dL — ABNORMAL HIGH (ref 70–99)
Glucose-Capillary: 160 mg/dL — ABNORMAL HIGH (ref 70–99)
Glucose-Capillary: 169 mg/dL — ABNORMAL HIGH (ref 70–99)
Glucose-Capillary: 174 mg/dL — ABNORMAL HIGH (ref 70–99)
Glucose-Capillary: 183 mg/dL — ABNORMAL HIGH (ref 70–99)

## 2019-12-01 MED ORDER — ESCITALOPRAM OXALATE 10 MG PO TABS
5.0000 mg | ORAL_TABLET | Freq: Every day | ORAL | Status: DC
  Administered 2019-12-01 – 2019-12-05 (×5): 5 mg via ORAL
  Filled 2019-12-01 (×5): qty 1

## 2019-12-01 NOTE — Progress Notes (Signed)
Occupational Therapy Weekly Progress Note  Patient Details  Name: Corey Bennett MRN: [id number] Date of Birth: 09/12/1960  Beginning of progress report period: November 22, 2019 End of progress report period: December 01, 2019  Today's Date: 12/01/2019 OT Individual Time: 4098-1191 OT Individual Time Calculation (min): 71 min    Patient has met 2 of 4 short term goals.  Pt is slowly progressing toward OT goals with inconsistent skill performance during sessions d/t fatigue and pain in RUE. Pt is able to complete UB dressing Min A with cues for hemidressing techniques, sit to stands and stand pivot transfers with Min A with RW. LB dressing/bathing is inconsistent d/t decreased desire to perform at this time. Cardiopulmonary endurance is slowly improving. On room air via trach collar for the majority of sessions with O2 sats remaining above 95% throughout.   Patient continues to demonstrate the following deficits: muscle weakness, decreased cardiorespiratoy endurance, impaired timing and sequencing, abnormal tone, unbalanced muscle activation, decreased coordination and decreased motor planning, decreased initiation, decreased attention, decreased awareness, decreased problem solving and decreased safety awareness and decreased sitting balance, decreased standing balance, decreased postural control and decreased balance strategies and therefore will continue to benefit from skilled OT intervention to enhance overall performance with BADL.  Patient progressing toward long term goals..  Continue plan of care.  OT Short Term Goals Week 1:  OT Short Term Goal 1 (Week 1): Pt will tolerate therapeutic activity for ~15 min in un supported sitting to improved cardiopulmonary endurance OT Short Term Goal 1 - Progress (Week 1): Progressing toward goal OT Short Term Goal 2 (Week 1): Pt will perform sit to stands inADL tasks with contact guard OT Short Term Goal 2 - Progress (Week 1): Progressing toward  goal OT Short Term Goal 3 (Week 1): Pt will don shirt with hemi dressing techniques with min A OT Short Term Goal 3 - Progress (Week 1): Met OT Short Term Goal 4 (Week 1): Pt will perform functional transfer (stand pivot with min A ) OT Short Term Goal 4 - Progress (Week 1): Met Week 2:  OT Short Term Goal 1 (Week 2): Pt will tolerate therapeutic activity for ~15 min in un supported sitting to improved cardiopulmonary endurance OT Short Term Goal 2 (Week 2): Pt will perform sit to stands in ADL tasks with contact guard OT Short Term Goal 3 (Week 2): Pt will perform stand pivot transfers to/from BSC/toilet with CGA OT Short Term Goal 4 (Week 2): Pt will perform LB dress with Min A with sit to stand to don over hips  Skilled Therapeutic Interventions/Progress Updates:    Pt greeted at time of session reclined in bed supine resting but agreeable to OT session with encouragement but fatigued. Originally agreeable to wash up at sink but when therapist tried to don shoes with regular socks or gripper socks, pt refused and then brought materials to bed side table. Pt performed UB bathing Min A for washing LUE d/t decreased functional use of R, and LB bathing Mod but pt declined bathing/changing brief and pants so unable to wash buttocks and periarea at this time. Pt utilized figure four to wash BLEs past knee level and feet with encouragement. Pt required extensive cues for breathing techniques, rest breaks for fatigue, and declined any standing activity today. Donned new shirt with cues for hemidressing techniques with Min A seated EOB, donned socks with Min A with figure four. Participated in ADL activity for approx 45 mins with BUE support, fatigued  but rest breaks provided. Respiratory entered and changed trach during rest break. Therapist performed RUE AAROM/PROM for shoulder flex/ext, abd/add, elbow flex/ext, forearm sup/pro, wrist extension, and extension of all digits composite and individually within  his pain tolerance, with assist at scapula when appropriate to prevent contracture development. Alarm on, call bell in reach.   Therapy Documentation Precautions:  Precautions Precautions: Fall Precaution Comments: trach, PEG, trach collar at 28% Restrictions Weight Bearing Restrictions: No    Therapy/Group: Individual Therapy  Viona Gilmore 12/01/2019, 1:32 PM

## 2019-12-01 NOTE — Progress Notes (Signed)
Physical Therapy Session Note  Patient Details  Name: Corey Bennett MRN: [id number] Date of Birth: February 11, 1961  Today's Date: 12/01/2019 PT Individual Time: 1100-1140 PT Individual Time Calculation (min): 40 min   Short Term Goals: Week 2:  PT Short Term Goal 1 (Week 2): Pt will consistently complete bed mobility with CGA. PT Short Term Goal 2 (Week 2): Pt will complete transfers with LRAD & CGA. PT Short Term Goal 3 (Week 2): Pt will ambulate 50 ft with LRAD & CGA.  Skilled Therapeutic Interventions/Progress Updates:  Pt received in handoff from PT & pt agreeable to tx. Dependent w/c transport to/from gym for time management. Stand pivot w/c>EOM with min assist with cuing for sequencing and managing RUE on hand orthosis with ongoing assist throughout session. Pt performs LLE 3" step taps with BUE support on RW & PT providing tactile cuing at R knee for full knee extension in stance with task focusing on RLE strengthening. Attempted to have pt perform task without LUE support but pt unable & with difficulty following commands. Sit<>stand with min assist with LUE on RLE to facilitate increased RLE use and focus on full knee extension in stance phase with pt able to do so. Gait x 20 ft with RW & min assist with PT providing tactile cuing at R knee for full extension in stance phase but pt unable. Pt also demonstrates impaired coordination & placement of RLE during stepping. Pt then reports fatigue & requests to end session. Stand pivot w/c>bed with min assist and sit>supine with supervision & extra time to elevate RLE onto bed. Pt left in bed with alarm set, HOB locked out at 30 degrees, call bell in reach.   Therapy Documentation Precautions:  Precautions Precautions: Fall Precaution Comments: trach, PEG, trach collar at 28% Restrictions Weight Bearing Restrictions: No   General: PT Amount of Missed Time (min): 20 Minutes PT Missed Treatment Reason: Patient fatigue   Vital Signs: Pt on room  air during session, PT dons trach collar at end of session. SpO2 >90% throughout, HR in the 70s. Pain: Pt grimacing with RUE movement - repositioning provided for increased comfort; pt declines asking for pain medication.    Therapy/Group: Individual Therapy  Sandi Mariscal 12/01/2019, 12:17 PM

## 2019-12-01 NOTE — Progress Notes (Signed)
Physical Therapy Session Note  Patient Details  Name: Corey Bennett MRN: [id number] Date of Birth: 06/06/1960  Today's Date: 12/01/2019 PT Individual Time: 1030-1100 PT Individual Time Calculation (min): 30 min   Short Term Goals: Week 2:  PT Short Term Goal 1 (Week 2): Pt will consistently complete bed mobility with CGA. PT Short Term Goal 2 (Week 2): Pt will complete transfers with LRAD & CGA. PT Short Term Goal 3 (Week 2): Pt will ambulate 50 ft with LRAD & CGA.  Skilled Therapeutic Interventions/Progress Updates:    Pt received supine in bed asleep, arousable and agreeable to participate in therapy make up session. No complaints of pain at rest, does have onset of R shoulder pain with mobility that subsides at rest. Bed mobility CGA. Assisted pt with donning shorts, shoes, and socks while seated EOB. Stand pivot transfer bed to w/c with RW and min A, assist for RLE management on R hand splint. Pt initially on 4L O2 via trach collar at rest, does well during session on room air with SpO2 at 96% and higher. Seated RUE PROM to hand, wrist, elbow, and shoulder in available ROM to prevent contracture. Pt exhibits decreased shoulder flexion and abduction PROM and experiences pain with this ROM. Pt handed off to next PT for continued therapy session.  Therapy Documentation Precautions:  Precautions Precautions: Fall Precaution Comments: trach, PEG, trach collar at 28% Restrictions Weight Bearing Restrictions: No    Therapy/Group: Individual Therapy   Peter Congo, PT, DPT  12/01/2019, 11:07 AM

## 2019-12-01 NOTE — Patient Care Conference (Signed)
Inpatient RehabilitationTeam Conference and Plan of Care Update Date: 12/01/2019   Time: 10:42 AM    Patient Name: Corey Bennett      Medical Record Number: [id number]  Date of Birth: May 17, 1960 Sex: Male         Room/Bed: 4W25C/4W25C-01 Payor Info: Payor: BLUE CROSS BLUE SHIELD / Plan: BCBS COMM PPO / Product Type: *No Product type* /    Admit Date/Time:  11/21/2019  2:14 PM  Primary Diagnosis:  Left middle cerebral artery stroke [facility])  Hospital Problems: Principal Problem:   Left middle cerebral artery stroke [facility]) Active Problems:   Malnutrition of moderate degree    Expected Discharge Date: Expected Discharge Date: 12/16/19  Team Members Present: Physician leading conference: Dr. Faith Rogue Care Coodinator Present: Cecile Sheerer, LCSWA;Delonda Coley Marlyne Beards, RN, BSN, CRRN Nurse Present: Margot Ables, LPN PT Present: Aleda Grana, PT OT Present: Primitivo Gauze, OT SLP Present: Feliberto Gottron, SLP PPS Coordinator present : Edson Snowball, Park Breed, SLP     Current Status/Progress Goal Weekly Team Focus  Bowel/Bladder   pt incontinent of b and b, in and out cath q8 with no void. lbm- 11/30/19  become continent of bowel and begin emptying bladder w/o in and out cath  assess q shif and pn, encourage using the bathroom, in and out cath q 8 with no void   Swallow/Nutrition/ Hydration   NPO with PEG, trials of nectar thick via TSP with chin tuck SLP ONLY  Min A  Trials of nectar thick via TSP with chin tuck, carryover of chin tuck, EMST and pharyngeal strengthening exercises   ADL's   min -mod A with transfers; mod A overall with self care; tolerating RUE therapy slightly more than last week; continues to c/o RUE pain  Supervision with UB dressing, toilet transfers, sit to stand, standing balance; min A LB dressing, toileting, bathing, shower transfers,  functional mobility, postural control, RUE NMR, balance, self care training, pt education   Mobility   min/mod assist  bed mobility, min assist sit<>stand, gait x 15 ft with RW & min assist, limited activity tolerance  supervision  balance, transfers, gait, endurance, strengthening, cognition, pt education   Communication   Max A  Min A  use of multimodal communication, idenitfying objects by name/ action   Safety/Cognition/ Behavioral Observations  Mod A?  Min A  emergent awareness   Pain   pt has no complaints of pain at this time  remain pain free  assess q shift and prn   Skin   peg tube in abdomen and trach  prevent any breakdown or infecion  assess q shift and prn     Discharge Planning:  Plans to d/c to [place]. Location TBD as unsure if it will be an ALF or SNF.   Team Discussion: I&O cathing. OT mobility is progressing, mod assist level. PT min/mod overall with supervision goals. Peg will stay in patient after discharge. Patient on target to meet rehab goals: yes  *See Care Plan and progress notes for long and short-term goals.   Revisions to Treatment Plan:  MD adding medication for depression.  Teaching Needs: Continue with family education.  Current Barriers to Discharge: Decreased caregiver support, Trach, Incontinence and I/O caths.  Possible Resolutions to Barriers: Teach trach care, timed toileting and bladder training to reduce I/O caths, continue medication regimen.     Medical Summary Current Status: right spastic hemiparesis. pain mgt, trach ongoing---will go home with. retaining urine  Barriers to Discharge: Medical stability  Possible Resolutions to [employer]: bladder emptying, I/O caths, trach mgt, daily VS and lab review   Continued Need for Acute Rehabilitation Level of Care: The patient requires daily medical management by a physician with specialized training in physical medicine and rehabilitation for the following reasons: Direction of a multidisciplinary physical rehabilitation program to maximize functional independence : Yes Medical management  of patient stability for increased activity during participation in an intensive rehabilitation regime.: Yes Analysis of laboratory values and/or radiology reports with any subsequent need for medication adjustment and/or medical intervention. : Yes   I attest that I was present, lead the team conference, and concur with the assessment and plan of the team.   Tennis Must 12/01/2019, 3:18 PM

## 2019-12-01 NOTE — Progress Notes (Signed)
Speech Language Pathology Daily Session Note  Patient Details  Name: Corey Bennett MRN: [id number] Date of Birth: 1960/09/11  Today's Date: 12/01/2019 SLP Individual Time: 0903-1000 SLP Individual Time Calculation (min): 57 min  Short Term Goals: Week 2: SLP Short Term Goal 1 (Week 2): Patient will tolerate teaspoon size nectar thick liquid trials without overt s/s of aspiration/penetration with supervisionA SLP Short Term Goal 2 (Week 2): Patient will imitate to produce 3-4 word functional phrases with modA. SLP Short Term Goal 3 (Week 2): Patient will utilize 4-6 cell picture/word communication board to comment/request during structured tasks with SLP with minA SLP Short Term Goal 4 (Week 2): Patient will sustain phonation at phoneme level for 3-4 seconds duration with minA SLP Short Term Goal 5 (Week 2): Patient will verb/action photos/pictures in field of two with 80% accuracy.  Skilled Therapeutic Interventions:Skilled ST services focused on swallow and language skills. SLP donned PMSV. SLP completed oral care prior to trials of nectar thick liquids via TSP, pt demonstrated x2 delayed cough when consuming 3oz and required min A fade to supervision A verbal/visual cues for accurate completion of chin tuck during piecemeal swallowing. Pt's O2 status were 99-97 and heart rate status 76-79 throughout session. Pt spontaneously read "v[place]" off of while board posted in room. SLP wrote down 3 word functional phrases on whiteboard, pt demonstrated ability to repeat phrases in 12 out 23 opportunities given written cues and fading from repetition in unison cues. Pt demonstrated ability to communicate at the 3 word phrase level with 2 out 3 were without phonemic/semantic errors. Pt demonstrated moderate awareness of verbal errors and ability to correct with max A multimodal cues in 70% of opportunities. Pt also demonstrated ability to express wants/needs and clarify communication in response to yes/no  questions with 80% accuracy. Pt demonstrated difficulty switching from expressive to receptive language tasks, perseverating on imitating speech verse following commands despite hand over hand cues. Pt demonstrated 0/5 accuracy matching objects in a field of 2 to word and 0/2 matching objects in a field of 2 to black/white action drawing, however demonstrated reading accuracy at word level in 4 out 5 opportunities. Pt was left in room with call bell within reach and bed alarm set. SLP recommends to continue skilled services.     Pain Pain Assessment Pain Score: 0-No pain  Therapy/Group: Individual Therapy  [name]  [facility] 12/01/2019, 12:49 PM

## 2019-12-01 NOTE — Progress Notes (Signed)
Pt trach changed from #6 shiley cfls to #4 shiley cfls without any complications.

## 2019-12-01 NOTE — Progress Notes (Signed)
[place] PHYSICAL MEDICINE & REHABILITATION PROGRESS NOTE   Subjective/Complaints: Up in bed. No new issues this morning. Still refuses splints/activity at times. Denies pain today  ROS: Limited due to cognitive/behavioral    Objective:   No results found. No results for input(s): WBC, HGB, HCT, PLT in the last 72 hours. No results for input(s): NA, K, CL, CO2, GLUCOSE, BUN, CREATININE, CALCIUM in the last 72 hours.  Intake/Output Summary (Last 24 hours) at 12/01/2019 1300 Last data filed at 11/30/2019 2345 Gross per 24 hour  Intake 50 ml  Output 1300 ml  Net -1250 ml        Physical Exam: Vital Signs Blood pressure (!) 147/58, pulse 73, temperature 97.6 F (36.4 C), temperature source Oral, resp. rate 18, height 6\' 1"  (1.854 m), weight 71.2 kg, SpO2 98 %. Constitutional: No distress . Vital signs reviewed. HEENT: EOMI, oral membranes moist Neck: supple, #6 trach, phonates around it, PMV not in Cardiovascular: RRR without murmur. No JVD    Respiratory/Chest: CTA Bilaterally without wheezes or rales. Normal effort    GI/Abdomen: BS +, non-tender, non-distended Ext: no clubbing, cyanosis, or edema Psych:  cooperative Skin: No evidence of breakdown, no evidence of rash Neurologic: apraxic    motor strength is 5/5 inleft deltoid, bicep, tricep, grip, hip flexor, knee extensors, ankle dorsiflexor and plantar flexor 3- RIght delt bi, tri, grip.  4- to 4/5 Right HF, KE, ADF. Keeps right knee bent.  mild resting flexor tone RUE  Musculoskeletal: RUE  less tender. No substantial subluxation appreciated    Assessment/Plan: 1. 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  Physiatrist is providing close team supervision and 24 hour management of active medical problems listed below.  Physiatrist and rehab team continue to assess barriers to discharge/monitor patient  progress toward functional and medical goals  Care Tool:  Bathing    Body parts bathed by patient: Chest, Abdomen, Face, Right upper leg, Left upper leg, Right lower leg, Left lower leg, Right arm   Body parts bathed by helper: Front perineal area, Buttocks, Left arm     Bathing assist Assist Level: Moderate Assistance - Patient 50 - 74%     Upper Body Dressing/Undressing Upper body dressing   What is the patient wearing?: Pull over shirt    Upper body assist Assist Level: Moderate Assistance - Patient 50 - 74%    Lower Body Dressing/Undressing Lower body dressing      What is the patient wearing?: Underwear/pull up, Pants     Lower body assist Assist for lower body dressing: Maximal Assistance - Patient 25 - 49%     Toileting Toileting Toileting Activity did not occur (Clothing management and hygiene only): N/A (no void or bm)  Toileting assist Assist for toileting: Maximal Assistance - Patient 25 - 49%     Transfers Chair/bed transfer  Transfers assist     Chair/bed transfer assist level: Minimal Assistance - Patient > 75%     Locomotion Ambulation   Ambulation assist   Ambulation activity did not occur: Safety/medical concerns  Assist level: Minimal Assistance - Patient > 75% Assistive device: Walker-rolling Max distance: 20 ft   Walk 10 feet activity   Assist  Walk 10 feet activity did not occur: Safety/medical concerns  Assist level: Minimal Assistance - Patient > 75% Assistive device: Walker-rolling   Walk 50 feet activity   Assist Walk 50 feet with  2 turns activity did not occur: Safety/medical concerns         Walk 150 feet activity   Assist Walk 150 feet activity did not occur: Safety/medical concerns         Walk 10 feet on uneven surface  activity   Assist Walk 10 feet on uneven surfaces activity did not occur: Safety/medical concerns         Wheelchair     Assist Will patient use wheelchair at discharge?:  Yes Type of Wheelchair: Manual Wheelchair activity did not occur: Safety/medical concerns         Wheelchair 50 feet with 2 turns activity    Assist    Wheelchair 50 feet with 2 turns activity did not occur: Safety/medical concerns       Wheelchair 150 feet activity     Assist  Wheelchair 150 feet activity did not occur: Safety/medical concerns       Blood pressure (!) 147/58, pulse 73, temperature 97.6 F (36.4 C), temperature source Oral, resp. rate 18, height 6\' 1"  (1.854 m), weight 71.2 kg, SpO2 98 %.    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 Antithrombotics: -DVT/anticoagulation:SCDs -antiplatelet therapy: Aspirin 81 mg daily and Plavix 75 mg daily 3. Pain Management:Tylenol as needed  -  gabapentin 100mg  tid for neuropathic pain RUE very beneficial  -support hemiplegic shoulder  -tone mgt  -pain appears to be well controlled. Not sedated from gaba 4. Mood:Provide emotional support -antipsychotic agents: Seroquel 50 mg nightly 5. Neuropsych: This patientis notcapable of making decisions on hisown behalf.  -continue sleep chart to measure sleep hygiene 6. Skin/Wound Care:Routine skin checks 7. Fluids/Electrolytes/Nutrition:Routine in and outs with follow-up chemistries 8. Seizure disorder. Dilantin 175 mg 3 times daily, Keppra 1000 mg twice daily, Vimpat 150 mg twice daily. outpt neuro f/u  -dilantin level still corrects to slightly below range  -appreciate pharmacy f/u  -dilantin dose increased to 200mg  tid with loading dose of 300mg  x 1 9/25 9. Diabetes mellitus. Hemoglobin A1c 7.2. Lantus insulin 6 units nightly, NovoLog 2 units every 4 hours. CBG (last 3)  Recent Labs    12/01/19 0434 12/01/19 0800  12/01/19 1208  GLUCAP 174* 160* 169*  9/26 control gradually improving: increase lantus to 7U BID today 9/27: labile- continue to monitor.  10. Dysphagia. NPO except for trials with SLP. Status post gastrostomy tube 11/10/2019 per interventional radiology. advance per SLP 11. Hypertension. Hydralazine 50 mg every 8 hours, Cardura 2 mg daily, Norvasc 10 mg daily.    -9/28 good control  Vitals:   12/01/19 0753 12/01/19 1158  BP:    Pulse: 76 73  Resp: 18 18  Temp:    SpO2: 95% 98%    12. Acute on chronic respiratory failure. Status post tracheostomy tube. Presently with a #6 Shiley flexible cuffless trach as of 11/02/2019 followed by pulmonary services Dr. 10/28  -plan is to continue trach and f/u with pulmonary in trach clinic 4-6 weeks   -PA spoke with brother last week who had questions in this regard  -spoke with pulmonary med about downsizing to #4. Will proceed today with capping trial later this week 13. Hyperlipidemia. Lipitor 14. Urinary retention. Urecholine 15 mg 3 times daily.     -doxasosin  2mg    -I/o cath prn   9/28 volumes still high.   -  continue urecholine 25mg  tid   LOS: 10 days A FACE TO FACE EVALUATION WAS PERFORMED  12/01/2019, 1:00 PM

## 2019-12-01 NOTE — Progress Notes (Signed)
Patient ID: Corey Bennett, male   DOB: 1960-06-20, 59 y.o.   MRN: [id number]  SW left message for pt brother [name] ([phone]) to provide updates from team conference. SW requested return phone call.   Cecile Sheerer, MSW, LCSWA Office: [phone] Cell: [phone] Fax: [phone]

## 2019-12-01 NOTE — Progress Notes (Signed)
Case reviewed with primary, okay to downsize to 4 cuffless and we will see later this week to consider capping.  Myrla Halsted MD PCCM

## 2019-12-02 ENCOUNTER — Inpatient Hospital Stay (HOSPITAL_COMMUNITY): Payer: BC Managed Care – PPO

## 2019-12-02 ENCOUNTER — Inpatient Hospital Stay (HOSPITAL_COMMUNITY): Payer: BC Managed Care – PPO | Admitting: Physical Therapy

## 2019-12-02 ENCOUNTER — Inpatient Hospital Stay (HOSPITAL_COMMUNITY): Payer: BC Managed Care – PPO | Admitting: Speech Pathology

## 2019-12-02 LAB — GLUCOSE, CAPILLARY
Glucose-Capillary: 111 mg/dL — ABNORMAL HIGH (ref 70–99)
Glucose-Capillary: 115 mg/dL — ABNORMAL HIGH (ref 70–99)
Glucose-Capillary: 157 mg/dL — ABNORMAL HIGH (ref 70–99)
Glucose-Capillary: 178 mg/dL — ABNORMAL HIGH (ref 70–99)
Glucose-Capillary: 210 mg/dL — ABNORMAL HIGH (ref 70–99)
Glucose-Capillary: 223 mg/dL — ABNORMAL HIGH (ref 70–99)

## 2019-12-02 NOTE — Progress Notes (Signed)
Physical Therapy Session Note  Patient Details  Name: Corey Bennett MRN: [id number] Date of Birth: 1960/04/18  Today's Date: 12/02/2019 PT Individual Time: 1450-1530 PT Individual Time Calculation (min): 40 min   Short Term Goals: Week 2:  PT Short Term Goal 1 (Week 2): Pt will consistently complete bed mobility with CGA. PT Short Term Goal 2 (Week 2): Pt will complete transfers with LRAD & CGA. PT Short Term Goal 3 (Week 2): Pt will ambulate 50 ft with LRAD & CGA.  Skilled Therapeutic Interventions/Progress Updates:     Pt received supine in bed and agreeable to therapy. Throughout session pt winces when RUE moved, but does not indicate where specifically pain is focused. PT provides repositioning to address pain.  Supine to sit with minA. Bed to chair without AD and with modA and stand step technique. Pt ambulates 41' with RW and R hand splint, with PT providing min/modA at hips for stability and to facilitate lateral weight shifting and progression of RLE, which tends to drag, especially with fatigue. Pt takes extended seated rest break following bout of ambulation.  Pt performs x5 and x10 minisquats in // bars with PT providing CGA and tactile cuing at R quad for improved contraction and full knee extension.   Pt ambulates 21' with RW and minA/ +1 and +2 WC follow for safety. Pt left seated in WC with alarm intact and all needs within reach.  Therapy Documentation Precautions:  Precautions Precautions: Fall Precaution Comments: trach, PEG, trach collar at 28% Restrictions Weight Bearing Restrictions: No   Therapy/Group: Individual Therapy  Beau Fanny, PT, DPT 12/02/2019, 3:36 PM

## 2019-12-02 NOTE — Progress Notes (Signed)
Occupational Therapy Session Note  Patient Details  Name: Corey Bennett MRN: [id number] Date of Birth: 05/02/1960  Today's Date: 12/02/2019 OT Individual Time: 0301-3143 OT Individual Time Calculation (min): 27 min    Short Term Goals: Week 1:  OT Short Term Goal 1 (Week 1): Pt will tolerate therapeutic activity for ~15 min in un supported sitting to improved cardiopulmonary endurance OT Short Term Goal 1 - Progress (Week 1): Progressing toward goal OT Short Term Goal 2 (Week 1): Pt will perform sit to stands inADL tasks with contact guard OT Short Term Goal 2 - Progress (Week 1): Progressing toward goal OT Short Term Goal 3 (Week 1): Pt will don shirt with hemi dressing techniques with min A OT Short Term Goal 3 - Progress (Week 1): Met OT Short Term Goal 4 (Week 1): Pt will perform functional transfer (stand pivot with min A ) OT Short Term Goal 4 - Progress (Week 1): Met  Skilled Therapeutic Interventions/Progress Updates:    1;1. Pt received in w/c requiring A to apply PMSV to trach. Pt O remains >95% throughout session. Pt adamant about getting back to bed pt able to direct OT in sequence of applying bed sheets appropriately for sequencing in a field of 2  By pointingand pt stand pivo ttransfer with MIN A to return to bed. Short gentle PROM completed on RUE and K tape applied to dorsal side of RUE to improve extension activation and edema in hand. Exited session wihtpt seated in bed, exit alarm on and pt missing 33 min of skilled OT. Will follow up as able   Therapy Documentation Precautions:  Precautions Precautions: Fall Precaution Comments: trach, PEG, trach collar at 28% Restrictions Weight Bearing Restrictions: No General: General PT Missed Treatment Reason: Toileting Vital Signs: Therapy Vitals Pulse Rate: 74 Resp: 18 Patient Position (if appropriate): Lying Oxygen Therapy SpO2: 98 % O2 Device: Tracheostomy Collar FiO2 (%): 21 % Pain: Pain Assessment Pain Scale:  Faces Pain Score: 0-No pain ADL: ADL Eating: NPO Upper Body Bathing: Moderate assistance Where Assessed-Upper Body Bathing: Sitting at sink Lower Body Bathing: Moderate assistance Where Assessed-Lower Body Bathing: Sitting at sink Upper Body Dressing: Moderate assistance Where Assessed-Upper Body Dressing: Sitting at sink Lower Body Dressing: Maximal assistance Where Assessed-Lower Body Dressing: Sitting at sink Toileting: Maximal assistance Where Assessed-Toileting: Bedside Commode Toilet Transfer: Minimal assistance Toilet Transfer Method: Stand pivot Toilet Transfer Equipment: Bedside commode Vision   Perception    Praxis   Exercises:   Other Treatments:     Therapy/Group: Individual Therapy  Tonny Branch 12/02/2019, 10:18 AM

## 2019-12-02 NOTE — Progress Notes (Signed)
Physical Therapy Session Note  Patient Details  Name: Corey Bennett MRN: [id number] Date of Birth: 11-Feb-1961  Today's Date: 12/02/2019 PT Individual Time: 7106-2694 PT Individual Time Calculation (min): 24 min   Short Term Goals: Week 2:  PT Short Term Goal 1 (Week 2): Pt will consistently complete bed mobility with CGA. PT Short Term Goal 2 (Week 2): Pt will complete transfers with LRAD & CGA. PT Short Term Goal 3 (Week 2): Pt will ambulate 50 ft with LRAD & CGA.  Skilled Therapeutic Interventions/Progress Updates:  Pt received in bed asleep, requiring extra time & tactile/verbal stimulation to awaken. PT observed pt to be incontinent of BM & encouraged pt to get cleaned up with PT but pt still drowsy & becoming irritated at therapist. PT left pt in care of nursing staff for peri hygiene. PT returned later & pt sitting in w/c & agreeable to tx. Pt on room air during session, HR = max of 81 bpm after gait. Sit<>stand with min assist with PT providing multimodal cuing for hand placement and assist with managing RUE on hand orthosis. gait x 15 ft + 30 ft with RW & min assist with PT providing tactile cuing at R knee for increased quad activation & knee extension in stance phase with pt demonstrating more awareness of need for this. Pt demonstrates RLE & overall fatigue as gait distances increases and decreased balance when turning. At end of session pt left in w/c with chair alarm donned, call bell in lap, all needs in reach.  Pt appears to grimace in pain when RUE/RLE is touched with repositioning/rest provided for increased comfort & RN made aware of behaviors demonstrating pain.    Therapy Documentation Precautions:  Precautions Precautions: Fall Precaution Comments: trach, PEG, trach collar at 28% Restrictions Weight Bearing Restrictions: No General: PT Amount of Missed Time (min): 36 Minutes PT Missed Treatment Reason: Toileting    Therapy/Group: Individual Therapy  Sandi Mariscal 12/02/2019, 9:19 AM

## 2019-12-02 NOTE — Progress Notes (Signed)
Speech Language Pathology Daily Session Note  Patient Details  Name: Corey Bennett MRN: [id number] Date of Birth: [birth year]  Today's Date: 12/02/2019 SLP Individual Time: 1300-1345 SLP Individual Time Calculation (min): 45 min  Short Term Goals: Week 2: SLP Short Term Goal 1 (Week 2): Patient will tolerate teaspoon size nectar thick liquid trials without overt s/s of aspiration/penetration with supervisionA SLP Short Term Goal 2 (Week 2): Patient will imitate to produce 3-4 word functional phrases with modA. SLP Short Term Goal 3 (Week 2): Patient will utilize 4-6 cell picture/word communication board to comment/request during structured tasks with SLP with minA SLP Short Term Goal 4 (Week 2): Patient will sustain phonation at phoneme level for 3-4 seconds duration with minA SLP Short Term Goal 5 (Week 2): Patient will verb/action photos/pictures in field of two with 80% accuracy.  Skilled Therapeutic Interventions:   Patient seen for skilled ST intervention with focus on speech and swallow function. Patient able to voice without PMV on after having trach downsized to #4 from #6. He self fed with teaspoon sips of nectar thick liquids with cues for chin tuck posture and exhibited immediate or mild delayed cough response approximately 25% of the time with most events occurring towards beginning of PO trials. Patient more expressive non-verbally and seemed to be frustrated and upset at beginning of session. He was able to verbalize at one-word level with SLP providing choice cues, visual cues, verbal cues and choice cues. (verbal and or written) to request "up" (head of bed), etc. He verbalized "I want out" and "I want to" but SLP was not able to determine what he was trying to communicate, even with written choice cues. Patient read aloud choices, such as "I want to sleep", "I want to wash", but did not indicate that any of these were close to what he was asking. At end of session, patient reached out  hand to shake SLP and seemed to be pleased with the session and assistance. Patient continues to benefit from skilled SLP intervention to maximize swallow, speech and language function prior to discharge.   Pain Pain Assessment Pain Scale: 0-10 Pain Score: 0-No pain  Therapy/Group: Individual Therapy   Angela Nevin, MA, CCC-SLP Speech Therapy

## 2019-12-02 NOTE — Progress Notes (Addendum)
Patient ID: Corey Bennett, male   DOB: 09-01-1960, 59 y.o.   MRN: [id number]   SW returned phone call/message to [name]/RN Coordinator with [facility] [phone]: [phone]) who was calling to get more insight on pt care needs after receiving phone call from his mother Corey Bennett. SW waiting on follow-up.  SW spoke with pt brother Corey Bennett [phone]) to provide updates from team conference, pt will d/c with trach and PEG, and d/c date 10/13. SW informed on above. SW discussed if IPR is not an option, were there any SNFs considered. He intends to get information from his mother so SW can begin to contacting locations.  *SW spoke with [name]/RN Coordinator with [facility] (p:[phone]/[phone]) to discuss pt current situation. Requested referral so they can follow-up with insurance to see about options. SW informed on pt current d/c date. SW faxed referral.   SW received updates from pt brother Corey Bennett about Preferred SNFs: 1) [facility] (SNF/[phone]/f:[phone]) - SW spoke with [place] [facility]/Admissions who reported they currently do not have any male beds and having a waiting list. SW faxed referral and;  2) [facility] [phone])- SW spoke with [facility]/Admissions who reported they do not accept trach patients, and not currently accepting anyone with TF.  Pt PCP will be Dr. Marijean Niemann [phone]). They are working on scheduling an appointment.   SW left message for Corey Bennett with above updates on SNF.  SW requested COVID vaccine.  Cecile Sheerer, MSW, LCSWA Office: [phone] Cell: [phone] Fax: [phone]

## 2019-12-03 ENCOUNTER — Inpatient Hospital Stay (HOSPITAL_COMMUNITY): Payer: BC Managed Care – PPO | Admitting: Speech Pathology

## 2019-12-03 ENCOUNTER — Inpatient Hospital Stay (HOSPITAL_COMMUNITY): Payer: BC Managed Care – PPO

## 2019-12-03 ENCOUNTER — Encounter: Payer: BC Managed Care – PPO | Admitting: Occupational Therapy

## 2019-12-03 ENCOUNTER — Inpatient Hospital Stay: Payer: BC Managed Care – PPO

## 2019-12-03 DIAGNOSIS — Z23 Encounter for immunization: Secondary | ICD-10-CM

## 2019-12-03 LAB — GLUCOSE, CAPILLARY
Glucose-Capillary: 193 mg/dL — ABNORMAL HIGH (ref 70–99)
Glucose-Capillary: 161 mg/dL — ABNORMAL HIGH (ref 70–99)
Glucose-Capillary: 183 mg/dL — ABNORMAL HIGH (ref 70–99)
Glucose-Capillary: 188 mg/dL — ABNORMAL HIGH (ref 70–99)
Glucose-Capillary: 192 mg/dL — ABNORMAL HIGH (ref 70–99)

## 2019-12-03 MED ORDER — DOXAZOSIN MESYLATE 4 MG PO TABS
4.0000 mg | ORAL_TABLET | Freq: Every day | ORAL | Status: DC
  Administered 2019-12-04 – 2019-12-11 (×7): 4 mg
  Filled 2019-12-03 (×9): qty 1

## 2019-12-03 MED ORDER — DOXAZOSIN MESYLATE 2 MG PO TABS
2.0000 mg | ORAL_TABLET | Freq: Once | ORAL | Status: AC
  Administered 2019-12-03: 2 mg via ORAL
  Filled 2019-12-03: qty 1

## 2019-12-03 NOTE — Progress Notes (Signed)
Occupational Therapy Session Note  Patient Details  Name: Corey Bennett MRN: [id number] Date of Birth: 02-Mar-1961  Today's Date: 12/03/2019 OT Individual Time: 0915-1000 OT Individual Time Calculation (min): 45 min  and Today's Date: 12/03/2019 OT Missed Time: 15 Minutes Missed Time Reason: Nursing care (cathing)   Short Term Goals: Week 1:  OT Short Term Goal 1 (Week 1): Pt will tolerate therapeutic activity for ~15 min in un supported sitting to improved cardiopulmonary endurance OT Short Term Goal 1 - Progress (Week 1): Progressing toward goal OT Short Term Goal 2 (Week 1): Pt will perform sit to stands inADL tasks with contact guard OT Short Term Goal 2 - Progress (Week 1): Progressing toward goal OT Short Term Goal 3 (Week 1): Pt will don shirt with hemi dressing techniques with min A OT Short Term Goal 3 - Progress (Week 1): Met OT Short Term Goal 4 (Week 1): Pt will perform functional transfer (stand pivot with min A ) OT Short Term Goal 4 - Progress (Week 1): Met Week 2:  OT Short Term Goal 1 (Week 2): Pt will tolerate therapeutic activity for ~15 min in un supported sitting to improved cardiopulmonary endurance OT Short Term Goal 2 (Week 2): Pt will perform sit to stands in ADL tasks with contact guard OT Short Term Goal 3 (Week 2): Pt will perform stand pivot transfers to/from BSC/toilet with CGA OT Short Term Goal 4 (Week 2): Pt will perform LB dress with Min A with sit to stand to don over hips  Skilled Therapeutic Interventions/Progress Updates:    1;1. Upon initial arrival pt being cathed by RN/NT. Pt missed 15 min tx time. Pt received in bed with pain any time RUE moved indicated by wincing, however pt unable to rate. K tape still in place from yesterday with no adverse reaction to skin. Pt agreeable to donning pants and going outside. Pt completes all stand pivot transfers with MIN A to w/c with RW and A for RUE placement into hand splint. Pt completes threading BLE into pants  and requires A to advance pants past hips and fasten. Pt escorted to [place] for mood support and to practice uneven surface mobility. Pt completes 2x25' functional mobility with RW negotiating turns around objects with min A for steering RW and VC for heel strike with R foot. Pt smiling at end of session and shaking OT hand "thank you, outisde." Exited session wih tpt returned to bed, exit alarm on and call light in reach  Therapy Documentation Precautions:  Precautions Precautions: Fall Precaution Comments: trach, PEG, trach collar at 28% Restrictions Weight Bearing Restrictions: No General:   Vital Signs: Therapy Vitals Pulse Rate: 81 Resp: 18 Patient Position (if appropriate): Lying Oxygen Therapy SpO2: 98 % O2 Device: Tracheostomy Collar O2 Flow Rate (L/min): 5 L/min FiO2 (%): 21 % Pain:   ADL: ADL Eating: NPO Upper Body Bathing: Moderate assistance Where Assessed-Upper Body Bathing: Sitting at sink Lower Body Bathing: Moderate assistance Where Assessed-Lower Body Bathing: Sitting at sink Upper Body Dressing: Moderate assistance Where Assessed-Upper Body Dressing: Sitting at sink Lower Body Dressing: Maximal assistance Where Assessed-Lower Body Dressing: Sitting at sink Toileting: Maximal assistance Where Assessed-Toileting: Bedside Commode Toilet Transfer: Minimal assistance Toilet Transfer Method: Stand pivot Toilet Transfer Equipment: Bedside commode Vision   Perception    Praxis   Exercises:   Other Treatments:     Therapy/Group: Individual Therapy  Tonny Branch 12/03/2019, 9:00 AM

## 2019-12-03 NOTE — Progress Notes (Signed)
Patient ID: Nora Rooke, male   DOB: 05-Jul-1960, 59 y.o.   MRN: [id number]  SW spoke with [place] [facility]/Admissions at [facility] ([phone]/f:[phone]) to confirm if referral received. Received referral, but remains with no male beds. Pt will be placed on wait list and there will be follow-up if able to accept pt when beds available. SW informed pt will have COVID vaccine while here in hospital.   SW left message for [name]/RN Coordinator with [facility] ([phone]/f:[phone]) to referral and if received. SW waiting on follow-up.  *reports referral has been received, will review, and will speak with insurance to get updates on IPR to IPR transition. Reports they are currently full, and if able to accept it would potentially be next week.   SW sent out referral to local SNFs.  *SW received updates from [employer] [facility] declining referral as they do not accept trach patients.   Cecile Sheerer, MSW, LCSWA Office: [phone] Cell: [phone] Fax: [phone]

## 2019-12-03 NOTE — Progress Notes (Signed)
Order is in for patient to downsize to a #4 cfls trach. Patient was downsized to a #4 cfls trach on 9/28.

## 2019-12-03 NOTE — NC FL2 (Addendum)
[place] MEDICAID FL2 LEVEL OF CARE SCREENING TOOL     IDENTIFICATION  Patient Name: Corey Bennett Birthdate: 10-07-1960 Sex: male Admission Date (Current Location): 11/21/2019  [facility] and [place] Number:  Producer, television/film/video and Address:  [facility], [address], [place], [place] [address]      Provider Number: [id number]  Attending Physician Name and Address:  Ranelle Oyster, MD  Relative Name and Phone Number:       Current Level of Care: Hospital Recommended Level of Care: Skilled Nursing Facility Prior Approval Number:    Date Approved/Denied:   PASRR Number:    Discharge Plan: SNF    Current Diagnoses: Patient Active Problem List   Diagnosis Date Noted  . Malnutrition of moderate degree 11/24/2019  . Left middle cerebral artery stroke (HCC) 11/21/2019  . 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 AKI (acute kidney injury) (HCC) 10/16/2019  . Essential hypertension 10/16/2019  . Type 2 diabetes mellitus with hyperlipidemia (HCC) 10/16/2019  . Seizure (HCC) 10/11/2019  . Altered mental status   . Status epilepticus (HCC)   . Carotid artery stenosis 09/08/2019    Orientation RESPIRATION BLADDER Height & Weight     Self  Tracheostomy, O2 (92mm Shiley; PMV, 8L) Incontinent Weight: 161 lb 9.6 oz (73.3 kg) Height:  6\' 1"  (185.4 cm)  BEHAVIORAL SYMPTOMS/MOOD NEUROLOGICAL BOWEL NUTRITION STATUS    Convulsions/Seizures Incontinent Diet, Feeding tube (Osmolite 1.5 @ 21ml/hr)  AMBULATORY STATUS COMMUNICATION OF NEEDS Skin   Limited Assist Non-Verbally Normal                       Personal Care Assistance Level of Assistance  Bathing, Feeding, Dressing Bathing Assistance: Limited assistance Feeding assistance: Limited assistance Dressing Assistance: Limited assistance     Functional Limitations Info  Speech     Speech Info: Impaired    SPECIAL CARE FACTORS  FREQUENCY  PT (By licensed PT), OT (By licensed OT), Speech therapy     PT Frequency: 5xs per week OT Frequency: 5xs per week     Speech Therapy Frequency: 5xs per week      Contractures Contractures Info: Not present    Additional Factors Info  Code Status, Allergies, Psychotropic, Insulin Sliding Scale Code Status Info: Full Allergies Info: NKA Psychotropic Info: Lexapro and Xanax Insulin Sliding Scale Info: Lantus 5units BID; Novolog 0-9 units every 4 hrs       Current Medications (12/03/2019):  This is the current hospital active medication list Current Facility-Administered Medications  Medication Dose Route Frequency Provider Last Rate Last Admin  . acetaminophen (TYLENOL) 160 MG/5ML solution 650 mg  650 mg Per Tube Q6H PRN 12/05/2019, PA-C   650 mg at 12/02/19 0912  . ALPRAZolam 12/04/19) tablet 0.25 mg  0.25 mg Per Tube TID PRN Prudy Feeler, MD   0.25 mg at 12/01/19 2240  . amLODipine (NORVASC) tablet 10 mg  10 mg Per Tube Daily 12/03/19, MD   10 mg at 12/03/19 [phone]  . aspirin chewable tablet 81 mg  81 mg Per Tube Daily 8527, PA-C   81 mg at 12/03/19 0831  . atorvastatin (LIPITOR) tablet 80 mg  80 mg Per Tube Daily 12/05/19, PA-C   80 mg at 12/03/19 0831  . bethanechol (URECHOLINE) tablet 25 mg  25 mg Per Tube TID 12/05/19, MD   25 mg  at 12/03/19 4098  . Chlorhexidine Gluconate Cloth 2 % PADS 6 each  6 each Topical BID Ranelle Oyster, MD   6 each at 12/02/19 1638  . clopidogrel (PLAVIX) tablet 75 mg  75 mg Per Tube Daily Charlton Amor, PA-C   75 mg at 12/03/19 1191  . docusate (COLACE) 50 MG/5ML liquid 100 mg  100 mg Per Tube BID PRN Charlton Amor, PA-C   100 mg at 11/24/19 1732  . doxazosin (CARDURA) tablet 2 mg  2 mg Per Tube Daily Ranelle Oyster, MD   2 mg at 12/03/19 0831  . escitalopram (LEXAPRO) tablet 5 mg  5 mg Oral QHS Faith Rogue T, MD   5 mg at 12/02/19 2226  . famotidine (PEPCID) tablet 10  mg  10 mg Per Tube Daily Charlton Amor, PA-C   10 mg at 12/03/19 0831  . feeding supplement (OSMOLITE 1.5 CAL) liquid 1,000 mL  1,000 mL Per Tube Continuous Ranelle Oyster, MD 70 mL/hr at 11/28/19 1158 1,000 mL at 11/28/19 1158  . feeding supplement (PROSource TF) liquid 45 mL  45 mL Per Tube BID Charlton Amor, PA-C   45 mL at 12/03/19 0830  . gabapentin (NEURONTIN) 250 MG/5ML solution 100 mg  100 mg Per Tube Q8H Ranelle Oyster, MD   100 mg at 12/03/19 0526  . Gerhardt's butt cream   Topical Daily Charlton Amor, PA-C   Given at 12/02/19 0913  . guaiFENesin (ROBITUSSIN) 100 MG/5ML solution 100 mg  5 mL Per Tube Q4H PRN Angiulli, Mcarthur Rossetti, PA-C      . hydrALAZINE (APRESOLINE) tablet 50 mg  50 mg Per Tube Q8H Angiulli, Mcarthur Rossetti, PA-C   50 mg at 12/03/19 0526  . insulin aspart (novoLOG) injection 0-9 Units  0-9 Units Subcutaneous Q4H Charlton Amor, PA-C   2 Units at 12/03/19 4782  . insulin aspart (novoLOG) injection 2 Units  2 Units Subcutaneous Q4H Charlton Amor, PA-C   2 Units at 12/03/19 9562  . insulin glargine (LANTUS) injection 5 Units  5 Units Subcutaneous BID Raulkar, Drema Pry, MD   5 Units at 12/03/19 [phone]  . ipratropium-albuterol (DUONEB) 0.5-2.5 (3) MG/3ML nebulizer solution 3 mL  3 mL Nebulization Q6H PRN Charlton Amor, PA-C   3 mL at 11/27/19 2149  . lacosamide (VIMPAT) tablet 150 mg  150 mg Per Tube BID Ranelle Oyster, MD   150 mg at 12/03/19 0831  . levETIRAcetam (KEPPRA) 100 MG/ML solution 1,000 mg  1,000 mg Per Tube BID Charlton Amor, PA-C   1,000 mg at 12/03/19 0830  . lidocaine (XYLOCAINE) 2 % jelly 1 application  1 application Urethral PRN Charlton Amor, PA-C   1 application at 11/27/19 1738  . phenytoin (DILANTIN) 125 MG/5ML suspension 200 mg  200 mg Per Tube TID Phillips Hay, RPH   200 mg at 12/03/19 0831  . polyethylene glycol (MIRALAX / GLYCOLAX) packet 17 g  17 g Per Tube Daily PRN Charlton Amor, PA-C   17 g at 11/24/19  1732  . potassium chloride 20 MEQ/15ML (10%) solution 20 mEq  20 mEq Per Tube Daily Ranelle Oyster, MD   20 mEq at 12/03/19 0831  . QUEtiapine (SEROQUEL) tablet 50 mg  50 mg Per Tube QHS AngiulliMcarthur Rossetti, PA-C   50 mg at 12/02/19 2226  . scopolamine (TRANSDERM-SCOP) 1 MG/3DAYS 1.5 mg  1 patch Transdermal Q72H Angiulli, Mcarthur Rossetti, PA-C  1.5 mg at 11/30/19 2111  . sennosides (SENOKOT) 8.8 MG/5ML syrup 5 mL  5 mL Per Tube BID AngiulliMcarthur Rossetti, PA-C   5 mL at 12/03/19 0830  . sorbitol 70 % solution 30 mL  30 mL Per Tube Daily PRN Ranelle Oyster, MD   30 mL at 11/24/19 [phone]  . traZODone (DESYREL) tablet 50 mg  50 mg Per Tube QHS PRN Ranelle Oyster, MD         Discharge Medications: Please see discharge summary for a list of discharge medications.  Relevant Imaging Results:  Relevant Lab Results:   Additional Information SS#: [id number]   Continue tube feeding via PEG (placed on 11/10/19): - Osmolite 1.5 @ 70 ml/hr to run over 20 hours (tube feeds can be held for up to 4 hours for therapies) - ProSource TF 45 ml BID - Free water flushes per MD/PA  Tube feeding regimen provides2180kcal, 110grams of protein, and of H2O  *Trach placed on 8/19  Gretchen Short, LCSW

## 2019-12-03 NOTE — Progress Notes (Addendum)
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 Patient is living in a assisted living facility and can ambulate by himself. Patient was found by staff to have a right sided twitching with right eye deviation. He was given Versed and Dilantin and was intubated for airway protection.  Patient was extubated on 8/11 and his seizures was controlled with Keppra, phenytoin, and Vimpat.  He was transferred to 3W. for medical management.  On 8/14, patient continued to have seizure with twitching of the right upper extremity.  2 mg of lorazepam was given.  Patient continued to desat in the 80s on 15 L of nonrebreather mask with altered mental status.  Patient was intubated to protect airway and transferred to the ICU.  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 Large area of encephalomalacia throughout the left MCA territory, consistent with remote infarct. UDS 8/8 >> + benzo (but given versed in ED); o/w negative MRI of brain8/8 >>large territory chronic hemorrhagic infarct in the left frontal lobe.Ill-defined hyperintensity  throughout the left basal ganglia and left thalamus with mild enhancement in dobutamine likely secondary to encephalitis, infiltrating tumor or atypical subacute infarction MRI of face and trigeminal region8/8 >>rules out soft tissue abscess CXR 8/17 >>improved aeration to the right lung base  CT Abd w/o contrast 8/17 >> cholelithiasis/trace ascites/aorthic atherosclerosis  Micro Data:  8/8 Sars-CoV-2 >> negative 8/8 BCx2 >> no growth 8/8 UC >>Klebsiella pneumonia 08/14 BCx >> negative 08/14 Trach Cx >> gram positive rods and cocci in pairs 08/17 Trach Cx >> negative   Antimicrobials:  Vancomycin 8/8 >> 8/12 Ceftriaxone 8/8 >> 8/12 Ampicillin 8/8 >> 8/11 Zosyn 8/14 >> 08/21 Vancomycin 08/14 >> 08/16  Interim history/subjective:   Tolerating downsize of the trach well Has minimal secretions. Continues to work with speech and swallow.  Able to phonate.  Objective   Blood pressure (!) 152/67, pulse 81, temperature 97.9 F (36.6 C), resp. rate 18, height 6\' 1"  (1.854 m), weight 73.3 kg, SpO2 98 %.    FiO2 (%):  [21 %] 21 %   Intake/Output Summary (Last 24 hours) at 12/03/2019 0926 Last data filed at 12/03/2019 0026 Gross per 24 hour  Intake 120 ml  Output 600 ml  Net -480 ml   Filed Weights   12/01/19 0439 12/02/19 0444 12/03/19 0359  Weight: 71.2 kg 71.1 kg 73.3 kg    Examination: Gen:      Frail HEENT:  EOMI, sclera anicteric Neck:     No masses; no thyromegaly Lungs:    Clear to auscultation bilaterally; normal respiratory effort CV:         Regular rate and rhythm; no murmurs Abd:      + bowel  sounds; soft, non-tender; no palpable masses, no distension Ext:    No edema; adequate peripheral perfusion Skin:      Warm and dry; no rash Neuro: Awake, oriented.  No new labs or imaging  Assessment & Plan:   Chronic respiratory failure with hypoxemia requiring tracheostomy tube placement, mechanical ventilatory support in setting of large LEFT MCA CVA with status  epilepticus. -continue trach collar with O2 to support saturations >90% -pulmonary hygiene -mobilize, coughing / deep breathing  Has tolerated downsize of trach on 9/28 Has minimal secretions Cap trach today and reassess tomorrow.  If he is doing well then we may be able to decannulate  Dysphagia.  SLP following  Status epilepticus in the setting of recent large left MCA stroke Hypertension Urinary Retention Diabetes type 2 with hyperglycemia  -per primary team   Best practice:  Per primary team  Chilton Greathouse MD [facility] Please see [email or web address] for pager details.  12/03/2019, 11:44 AM

## 2019-12-03 NOTE — Progress Notes (Signed)
Speech Language Pathology Daily Session Note  Patient Details  Name: Corey Bennett MRN: [id number] Date of Birth: Jul 10, 1960  Today's Date: 12/03/2019 SLP Individual Time: 1300-1345 SLP Individual Time Calculation (min): 45 min  Short Term Goals: Week 2: SLP Short Term Goal 1 (Week 2): Patient will tolerate teaspoon size nectar thick liquid trials without overt s/s of aspiration/penetration with supervisionA SLP Short Term Goal 2 (Week 2): Patient will imitate to produce 3-4 word functional phrases with modA. SLP Short Term Goal 3 (Week 2): Patient will utilize 4-6 cell picture/word communication board to comment/request during structured tasks with SLP with minA SLP Short Term Goal 4 (Week 2): Patient will sustain phonation at phoneme level for 3-4 seconds duration with minA SLP Short Term Goal 5 (Week 2): Patient will verb/action photos/pictures in field of two with 80% accuracy.  Skilled Therapeutic Interventions:   Patient seen for skilled ST session with focus on speech-language and swallow function goals. Of note, patient had his trach capped today and is tolerating well on room air with SpO2 at 97-99%. After oral care, patient consumed approximately 8 teaspoon sips of nectar thick juice with delayed mild cough on 1/8 trials. Patient continues to require cues for full chin tuck, however he does independently perform approximated chin tuck and swallows twice with each sip.  Patient also seen for speech-language goals. He pointed to object photos in field of two with 80% accuracy when objects were not similar in shape. He continues to have difficulty differentiating between objects and/or object photos for similar shape, such as paintbrush, spoon, knife, pen. He read aloud at 2-3 phrase level accurate and with approximately 65% intelligibility overall. He pointed to object photos when function described with 70% accuracy after multiple trials.  Patient continues to benefit from skilled SLP  intervention to maximize swallow function and speech-language function prior to discharge. Pain Pain Assessment Pain Scale: Faces Faces Pain Scale: No hurt  Therapy/Group: Individual Therapy  Angela Nevin, MA, CCC-SLP Speech Therapy

## 2019-12-03 NOTE — Plan of Care (Signed)
  Problem: RH BLADDER ELIMINATION Goal: RH STG MANAGE BLADDER WITH ASSISTANCE Description: STG Manage Bladder With min Assistance Outcome: Progressing Goal: RH STG MANAGE BLADDER WITH MEDICATION WITH ASSISTANCE Description: STG Manage Bladder With Medication With min Assistance. Outcome: Progressing   Problem: RH SKIN INTEGRITY Goal: RH STG SKIN FREE OF INFECTION/BREAKDOWN Description: No new skin breakdown this admission Outcome: Progressing Goal: RH STG MAINTAIN SKIN INTEGRITY WITH ASSISTANCE Description: STG Maintain Skin Integrity With min Assistance. Outcome: Progressing

## 2019-12-03 NOTE — Progress Notes (Signed)
[place] PHYSICAL MEDICINE & REHABILITATION PROGRESS NOTE   Subjective/Complaints: Up at [facility] emptying bowels when I came in this morning. No new problems. Tolerating #4 trach without issue  ROS: Patient denies fever, rash, sore throat, blurred vision, nausea, vomiting, diarrhea, cough, shortness of breath or chest pain, headache, or mood change.    Objective:   No results found. No results for input(s): WBC, HGB, HCT, PLT in the last 72 hours. No results for input(s): NA, K, CL, CO2, GLUCOSE, BUN, CREATININE, CALCIUM in the last 72 hours.  Intake/Output Summary (Last 24 hours) at 12/03/2019 1039 Last data filed at 12/03/2019 0026 Gross per 24 hour  Intake 120 ml  Output 600 ml  Net -480 ml        Physical Exam: Vital Signs Blood pressure (!) 152/67, pulse 81, temperature 97.9 F (36.6 C), resp. rate 18, height 6\' 1"  (1.854 m), weight 73.3 kg, SpO2 98 %. Constitutional: No distress . Vital signs reviewed. HEENT: EOMI, oral membranes moist Neck: #4 trach, phonates around Cardiovascular: RRR without murmur. No JVD    Respiratory/Chest: CTA Bilaterally without wheezes or rales. Normal effort    GI/Abdomen: BS +, non-tender, non-distended Ext: no clubbing, cyanosis, or edema Psych: pleasant and cooperative Skin: No evidence of breakdown, no evidence of rash Neurologic: remains apraxic   motor strength is 5/5 inleft deltoid, bicep, tricep, grip, hip flexor, knee extensors, ankle dorsiflexor and plantar flexor 3- RIght delt bi, tri, grip.  4- to 4/5 Right HF, KE, ADF. RUE flexor tone Musculoskeletal: RUE still sl tender. No substantial subluxation appreciated    Assessment/Plan: 1. 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  Physiatrist is providing close team supervision and 24 hour management of active medical problems listed below.  Physiatrist and rehab team  continue to assess barriers to discharge/monitor patient progress toward functional and medical goals  Care Tool:  Bathing    Body parts bathed by patient: Chest, Abdomen, Face, Right upper leg, Left upper leg, Right lower leg, Left lower leg, Right arm   Body parts bathed by helper: Left arm (declined washing under brief for buttocks and peri)     Bathing assist Assist Level: Moderate Assistance - Patient 50 - 74%     Upper Body Dressing/Undressing Upper body dressing   What is the patient wearing?: Pull over shirt    Upper body assist Assist Level: Minimal Assistance - Patient > 75%    Lower Body Dressing/Undressing Lower body dressing      What is the patient wearing?: Underwear/pull up, Pants     Lower body assist Assist for lower body dressing: Maximal Assistance - Patient 25 - 49%     Toileting Toileting Toileting Activity did not occur (Clothing management and hygiene only): N/A (no void or bm)  Toileting assist Assist for toileting: Maximal Assistance - Patient 25 - 49%     Transfers Chair/bed transfer  Transfers assist     Chair/bed transfer assist level: Moderate Assistance - Patient 50 - 74%     Locomotion Ambulation   Ambulation assist   Ambulation activity did not occur: Safety/medical concerns  Assist level: Minimal Assistance - Patient > 75% Assistive device: Walker-rolling Max distance: 50'   Walk 10 feet activity   Assist  Walk 10 feet activity did not occur: Safety/medical concerns  Assist level: Minimal Assistance - Patient > 75% Assistive device: Walker-rolling   Walk 50  feet activity   Assist Walk 50 feet with 2 turns activity did not occur: Safety/medical concerns  Assist level: Minimal Assistance - Patient > 75% Assistive device: Walker-rolling    Walk 150 feet activity   Assist Walk 150 feet activity did not occur: Safety/medical concerns         Walk 10 feet on uneven surface  activity   Assist Walk 10  feet on uneven surfaces activity did not occur: Safety/medical concerns         Wheelchair     Assist Will patient use wheelchair at discharge?: Yes Type of Wheelchair: Manual Wheelchair activity did not occur: Safety/medical concerns         Wheelchair 50 feet with 2 turns activity    Assist    Wheelchair 50 feet with 2 turns activity did not occur: Safety/medical concerns       Wheelchair 150 feet activity     Assist  Wheelchair 150 feet activity did not occur: Safety/medical concerns       Blood pressure (!) 152/67, pulse 81, temperature 97.9 F (36.6 C), resp. rate 18, height 6\' 1"  (1.854 m), weight 73.3 kg, SpO2 98 %.    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 Antithrombotics: -DVT/anticoagulation:SCDs -antiplatelet therapy: Aspirin 81 mg daily and Plavix 75 mg daily 3. Pain Management:Tylenol as needed  -  gabapentin 100mg  tid for neuropathic pain RUE very beneficial  -support hemiplegic shoulder  -tone mgt  -pain appears to be well controlled. Not sedated from gabapentin 4. Mood:Provide emotional support -antipsychotic agents: Seroquel 50 mg nightly 5. Neuropsych: This patientis notcapable of making decisions on hisown behalf.  -continue sleep chart to measure sleep hygiene 6. Skin/Wound Care:Routine skin checks 7. Fluids/Electrolytes/Nutrition:Routine in and outs with follow-up chemistries 8. Seizure disorder. Dilantin 175 mg 3 times daily, Keppra 1000 mg twice daily, Vimpat 150 mg twice daily. outpt neuro f/u  -dilantin level low  -appreciate pharmacy f/u  -dilantin dose increased to 200mg  tid with loading dose of 300mg  x 1 9/25  -recheck next week 9. Diabetes mellitus. Hemoglobin A1c 7.2. Lantus  insulin 6 units nightly, NovoLog 2 units every 4 hours. CBG (last 3)  Recent Labs    12/02/19 2357 12/03/19 0400 12/03/19 0807  GLUCAP 111* 193* 161*  9/30 improving control, cover increases with SSI  10. Dysphagia. NPO except for trials with SLP. Status post gastrostomy tube 11/10/2019 per interventional radiology. advance per SLP 11. Hypertension. Hydralazine 50 mg every 8 hours, Cardura 2 mg daily, Norvasc 10 mg daily.    -9/30 reasonable control  Vitals:   12/03/19 0359 12/03/19 0824  BP: (!) 152/67   Pulse: 76 81  Resp: 17 18  Temp: 97.9 F (36.6 C)   SpO2: 98% 98%    12. Acute on chronic respiratory failure. Status post tracheostomy tube. Presently with a #6 Shiley flexible cuffless trach as of 11/02/2019 followed by pulmonary services Dr. 10/30  -doing well #4, will begin plugging trials during day  -Pulmonary to follow up 13. Hyperlipidemia. Lipitor 14. Urinary retention. still retaining 600cc +     -doxasosin  2mg ---increase to 4mg  9/30   -I/o cath prn   -continue urecholine 25mg  tid   LOS: 12 days A FACE TO FACE EVALUATION WAS PERFORMED  12/05/19 12/03/2019, 10:39 AM

## 2019-12-03 NOTE — Progress Notes (Signed)
Physical Therapy Session Note  Patient Details  Name: Corey Bennett MRN: [id number] Date of Birth: [birth year]  Today's Date: 12/03/2019 PT Individual Time: 1400-1500 PT Individual Time Calculation (min): 60 min  and Today's Date: 12/03/2019 PT Missed Time: 15 Minutes Missed Time Reason: Patient fatigue;Patient unwilling to participate  Short Term Goals: Week 2:  PT Short Term Goal 1 (Week 2): Pt will consistently complete bed mobility with CGA. PT Short Term Goal 2 (Week 2): Pt will complete transfers with LRAD & CGA. PT Short Term Goal 3 (Week 2): Pt will ambulate 50 ft with LRAD & CGA.  Skilled Therapeutic Interventions/Progress Updates:     Pt received sleeping in bed. Awoken and agreeable to therapy. Indicates headache pain. Number not provided. PT provides rest breaks as needed to manage pain. Supine to sit with minA. SPT to WC with HHA minA. WC transport outside for time management. Once outside pt is hesitant to perform activities therapist suggests. With gentle encouragement and education on benefits of participation, pt eventually agreeable to attempting to stand. Sit to stand with CGA. PT assists pt to strap R hand in splint. Pt remains standing x1 minute to fatigue. Then requests to go back inside. Indoors, pt ambulates 29' with minA at hips and RW with R hand splint. Pt maintains forward flexed posture and has narrow BOS, but no overt LOBs. Pt indicates fatigue following ambulation and requests to return to room. Once back in room, pt impulsively stands from [facility] while PT is arranging room, and attempts to transfer back to bed. PT able to assist pt and educates on need to wait prior to mobilizing. MinA for return to bed and sit to supine. Left supine in bed with alarm intact and all needs within reach. Pt misses 15 minutes of therapy due to fatigue.  Therapy Documentation Precautions:  Precautions Precautions: Fall Precaution Comments: trach, PEG, trach collar at  28% Restrictions Weight Bearing Restrictions: No General:   Vital Signs: Therapy Vitals Pulse Rate: 75 Resp: 16 Patient Position (if appropriate): Lying Oxygen Therapy SpO2: 100 % O2 Device: Room Air Pain: Pain Assessment Pain Scale: Faces Faces Pain Scale: No hurt Mobility:   Locomotion :    Trunk/Postural Assessment :    Balance:   Exercises:   Other Treatments:      Therapy/Group: Co-Treatment  Beau Fanny 12/03/2019, 2:46 PM

## 2019-12-04 ENCOUNTER — Inpatient Hospital Stay (HOSPITAL_COMMUNITY): Payer: BC Managed Care – PPO | Admitting: Physical Therapy

## 2019-12-04 ENCOUNTER — Inpatient Hospital Stay (HOSPITAL_COMMUNITY): Payer: BC Managed Care – PPO | Admitting: Speech Pathology

## 2019-12-04 ENCOUNTER — Inpatient Hospital Stay (HOSPITAL_COMMUNITY): Payer: BC Managed Care – PPO | Admitting: Occupational Therapy

## 2019-12-04 ENCOUNTER — Inpatient Hospital Stay (HOSPITAL_COMMUNITY): Payer: BC Managed Care – PPO

## 2019-12-04 DIAGNOSIS — Z930 Tracheostomy status: Secondary | ICD-10-CM

## 2019-12-04 LAB — GLUCOSE, CAPILLARY
Glucose-Capillary: 134 mg/dL — ABNORMAL HIGH (ref 70–99)
Glucose-Capillary: 142 mg/dL — ABNORMAL HIGH (ref 70–99)
Glucose-Capillary: 144 mg/dL — ABNORMAL HIGH (ref 70–99)
Glucose-Capillary: 177 mg/dL — ABNORMAL HIGH (ref 70–99)
Glucose-Capillary: 179 mg/dL — ABNORMAL HIGH (ref 70–99)
Glucose-Capillary: 197 mg/dL — ABNORMAL HIGH (ref 70–99)
Glucose-Capillary: 254 mg/dL — ABNORMAL HIGH (ref 70–99)

## 2019-12-04 NOTE — Progress Notes (Signed)
RT placed red cap on pt's trach. Pt tolerated well.

## 2019-12-04 NOTE — Progress Notes (Signed)
Speech Language Pathology Weekly Progress and Session Note  Patient Details  Name: Corey Bennett MRN: [id number] Date of Birth: 1960-09-01  Beginning of progress report period: November 28, 2019 End of progress report period: December 04, 2019  Today's Date: 12/04/2019 SLP Individual Time: 1300-1350 SLP Individual Time Calculation (min): 50 min  Short Term Goals: Week 2: SLP Short Term Goal 1 (Week 2): Patient will tolerate teaspoon size nectar thick liquid trials without overt s/s of aspiration/penetration with supervisionA SLP Short Term Goal 1 - Progress (Week 2): Partly met SLP Short Term Goal 2 (Week 2): Patient will imitate to produce 3-4 word functional phrases with modA. SLP Short Term Goal 2 - Progress (Week 2): Met SLP Short Term Goal 3 (Week 2): Patient will utilize 4-6 cell picture/word communication board to comment/request during structured tasks with SLP with minA SLP Short Term Goal 3 - Progress (Week 2): Partly met SLP Short Term Goal 4 (Week 2): Patient will sustain phonation at phoneme level for 3-4 seconds duration with minA SLP Short Term Goal 4 - Progress (Week 2): Met SLP Short Term Goal 5 (Week 2): Patient will verb/action photos/pictures in field of two with 80% accuracy. SLP Short Term Goal 5 - Progress (Week 2): Met    New Short Term Goals: Week 3: SLP Short Term Goal 1 (Week 3): Patient will participate in repeat MBS to reassess swallow function SLP Short Term Goal 2 (Week 3): Patient will point to object photos or pictures in field of 4-6 with 85% accuracy and minA SLP Short Term Goal 3 (Week 3): Patient will tolerate trials of nectar thick and thin liquids with minimal overt s/s aspiration/penetration SLP Short Term Goal 4 (Week 3): Patient will utilize communication board of 6-8 printed words/pictures to comment and request during functional tasks. SLP Short Term Goal 5 (Week 3): Patient will imitate at 4-5 word phrase level with modA and 70%  accuracy  Weekly Progress Updates:  Patient met 3/5 STG's and is making progress towards goals he did not meet. He recently had his trach downsized, followed by trach capping and decannulation on 10/1. He continues with frustration but his participation and alertness have improved. He is NPO with trials of PO's with SLP only and plan for reassessment of swallow function via MBS.   Intensity: Minumum of 1-2 x/day, 30 to 90 minutes Frequency: 3 to 5 out of 7 days Duration/Length of Stay: 3 weeks Treatment/Interventions: English as a second language teacher;Dysphagia/aspiration precaution training;Environmental controls;Patient/family education;Speech/Language facilitation;Internal/external aids;Multimodal communication approach  Daily Session  Skilled Therapeutic Interventions: Patient seen to address swallow and speech-language goals. Patient appeared distressed and tired and he did point to "tired" from list of 5 choices (printed words) and then "frustrated". When SLP gave three options, printed out and read aloud to patient asking him 'Why are you frustrated?' he chose "because its hard to talk". Patient started to consume nectar thick liquids with spoon but was having difficulty and so he started to drink cup sips. SLP provided cues to take a small sip and to tuck chin, but patient was impulsive. He exhibited one cough response out of 5 sips, which is an improvement overall, and duration of this cough was shorter than previous sessions. Patient then indicated he was tired and lay down. Pulmonology PA spoke with SLP and then decannulated patient. He initially seemed to be gasping for air but this was likely a little anxiety, as he settled down quickly. Patient voiced without occulding stoma. SpO2 was 100% on room air. Patient  able to verbalize "yes", "no" and was more consistent with verbal responses than previously. He required moderate A education for following PA's recommendations to occlude stoma when coughing or  talking. Patient continues to benefit from skilled SLP intervention to maximize swallow, speech and language function prior to discharge.       General    Pain Pain Assessment Pain Scale: 0-10 Pain Score: 0-No pain  Therapy/Group: Individual Therapy   Sonia Baller, MA, CCC-SLP Speech Therapy

## 2019-12-04 NOTE — Progress Notes (Signed)
Pt removed continuous pulse ox from finger twice. This nurse attempted to replace and pt refused. Pt was educated on the need for the continuous pulse oxygen reading. Pt also removed humidified air from around trach. Pt was educated on the need. Pt is in no distress at this time.

## 2019-12-04 NOTE — Progress Notes (Signed)
RT found that pt did not have trach in place. RT spoke with RN stating that a doctor came and decannulated pt about an hour ago. Pt tolerating well with SVS.

## 2019-12-04 NOTE — Progress Notes (Signed)
Physical Therapy Session Note  Patient Details  Name: Corey Bennett MRN: [id number] Date of Birth: Jan 16, 1961  Today's Date: 12/04/2019 PT Individual Time: 0916-1001 PT Individual Time Calculation (min): 45 min  and Today's Date: 12/04/2019 PT Missed Time: 15 Minutes Missed Time Reason: Other (Comment) (RN in room cathing patient)  Short Term Goals: Week 2:  PT Short Term Goal 1 (Week 2): Pt will consistently complete bed mobility with CGA. PT Short Term Goal 2 (Week 2): Pt will complete transfers with LRAD & CGA. PT Short Term Goal 3 (Week 2): Pt will ambulate 50 ft with LRAD & CGA.  Skilled Therapeutic Interventions/Progress Updates:     Pt received supine in bed and agreeable to therapy. RN in room assisting pt with in-and-out cath, and pt misses initial 15 minutes of session. Supine to sit with supervision. Pt stands with minA and PT assists with donning lower body clothing prior to performing stand step transfer to [facility] with minA. WC transport to gym for time management. Pt performs sit to stand with CGA and performs x5 minisquats for strengthening. Pt attempts to perform BUE flexion with 2lb bar but indicates pain in R shoulder and hand. Number not provided. PT adjusts activity to manage pain. Pt then ambulates with RW and +2 WC follow and minA at hips to facilitate lateral weight shifting and progression of RLE. 60', 60', and 110' with extended seated rest breaks. Pt performs stand pivot transfer back to bed with minA and return to supine with supervision and cues for positioning. Left supine with alarm intact and all needs within reach.  Therapy Documentation Precautions:  Precautions Precautions: Fall Precaution Comments: trach, PEG, trach collar at 28% Restrictions Weight Bearing Restrictions: No   Therapy/Group: Individual Therapy  Beau Fanny, PT, DPT 12/04/2019, 12:44 PM

## 2019-12-04 NOTE — Progress Notes (Signed)
Physical Therapy Session Note  Patient Details  Name: Corey Bennett MRN: [id number] Date of Birth: Mar 30, 1960  Today's Date: 12/04/2019  Attempted to see pt for scheduled PT session. Pt received in bed asleep but awakened with extra time. PT initially thought pt had to go to bathroom but then begins wincing in pain, nurse called to room. Pt then reports he just wants to be left alone despite encouragement/attempts for OOB activity. Pt left in bed with alarm set, call bell & all needs in reach.   Therapy Documentation Precautions:  Precautions Precautions: Fall Precaution Comments: trach, PEG, trach collar at 28% Restrictions Weight Bearing Restrictions: No General: PT Amount of Missed Time (min): 30 Minutes PT Missed Treatment Reason: Patient unwilling to participate    Therapy/Group: Individual Therapy  Sandi Mariscal 12/04/2019, 3:35 PM

## 2019-12-04 NOTE — Progress Notes (Signed)
Patient ID: Corey Bennett, male   DOB: 05/05/1960, 59 y.o.   MRN: [id number]  SW received updates from attending with possible plans to decannulate patient early next week. Pt trach is currently capped.  SW returned phone call to pt brother [name] [phone]) to provide updates on SNF updates, and [place] IPR process. SW explained that preferred SNFs: 1) [facility] will not take trach patients, however with new changes will follow-up to inform on changes. SW reiterated they continue to have a waitlist and; 2) [facility]- does not accept trachs of TF. SW encouraged them to continue to explore SNF options and provide SW with list. Family would like to explore if insurance will accept pt with [facility] IPR transfer if possible. He also reports they are still working on a PCP as they did not think [name] was a good fit for pt.   SW spoke with [place] Bryant/Admissions at [facility] (p:[phone]/f:[phone]) to inform on changes with regard to trach, and continuing to work on COVID vaccine (johnson and [place]). Reports pt will now be placed on waitlist since there are plans to remove trach. She also states that if he gets vaccine, he is still not eligible for admission for up to 2 weeks which is when you are considered to be fully vaccinated. SW left message for [name]/RN Coordinator with [facility] ([phone]/f:[phone])to inform on above with regard to trach changes, and see if there are any updates from insurance. SW waiting on follow-up. SW left message for pt brother [name] to inform on updates from [facility].   Cecile Sheerer, MSW, LCSWA Office: [phone] Cell: [phone] Fax: [phone]

## 2019-12-04 NOTE — Plan of Care (Signed)
  Problem: RH BOWEL ELIMINATION Goal: RH STG MANAGE BOWEL WITH ASSISTANCE Description: STG Manage Bowel with min Assistance. Outcome: Progressing   Problem: RH SAFETY Goal: RH STG ADHERE TO SAFETY PRECAUTIONS W/ASSISTANCE/DEVICE Description: STG Adhere to Safety Precautions With min Assistance/Device. Outcome: Progressing Goal: RH STG DECREASED RISK OF FALL WITH ASSISTANCE Description: STG Decreased Risk of Fall With min Assistance. Outcome: Progressing

## 2019-12-04 NOTE — Evaluation (Signed)
Recreational Therapy Assessment and Plan  Patient Details  Name: Corey Bennett MRN: [id number] Date of Birth: Sep 06, 1960 Today's Date: 12/04/2019  Rehab Potential:  Fair ELOS:   10/13 SNF  Assessment   Hospital Problem: Active Problems:   Left middle cerebral artery stroke [facility])   Past Medical History:      Past Medical History:  Diagnosis Date  . Carotid artery occlusion   . Diabetes ([place])   . Hypercholesterolemia   . Hypertension   . 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 Stroke [facility])    Past Surgical History:       Past Surgical History:  Procedure Laterality Date  . ENDARTERECTOMY Left 09/08/2019   Procedure: LEFT CAROTID ENDARTERECTOMY;  Surgeon: Angelia Mould, MD;  Location: [facility] OR;  Service: Vascular;  Laterality: Left;  . IR GASTROSTOMY TUBE MOD SED  11/10/2019  . PATCH ANGIOPLASTY Left 09/08/2019   Procedure: PATCH ANGIOPLASTY USING XENOSURE BIOLOGIC PATCH 1x6cm;  Surgeon: Angelia Mould, MD;  Location: [facility] OR;  Service: Vascular;  Laterality: Left;  . TONSILLECTOMY      Assessment & Plan Clinical Impression: Patient is a 59 y.o. year old male right-handed male with history of diabetes mellitus, hypertension, hyperlipidemia, left MCA CVA4/2021 while living in [place] underwent rehabilitation at [facility] with residual aphasia and right-sided weakness as well as left CEA 09/2019 who resides presently at assisted living facility[facility]. Presented 10/10/2019 with seizure-like activity requiring intubation. CT/MRI showed large territory chronic hemorrhagic infarct in the left frontal lobe. Ill-defined hyperintensities throughout the left basal ganglia and thalamus. 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 Patient remained on aspirin Plavix for CVA prophylaxis. Keppra/Dilantin and Vimpat  ordered for seizure disorder. Patient did initially require intubation extubated 10/14/2019 however continued to have desaturations into the 80s 15 L nonrebreather mask requiring reintubation and tracheostomy performed 10/22/2019 presently with a #6 Shiley cuffless trach as of 11/02/2019. Currently n.p.o. with gastrostomy tube placed 11/10/2019 per Dr. Earleen Newport of interventional radiology. Patient bouts of urinary retention maintained on UrecholinePatient transferred to CIR on 11/21/2019 .  Pt currently on #4 trach with trials of capping.   Pt presents with decreased activity tolerance, decreased functional mobility, decreased balance, decreased attention, decreased awareness, decreased problem solving, decreased safety awareness, aphasia Limiting pt's independence with leisure/community pursuits.  Met with pt today during co-treat with PT.  Difficult to complete leisure assessment due to language.  Pt did attempt to answer yes not questions but was not consistent with answers, often shaking/nodding his head the opposite of what he was saying.  Pt did ambulate with RW with min assist during this session.     Plan  Due to the above and LRT absence next week, no further TR implemented at this time.  Will continue to monitor should pt remain on CIR upon my return.    Recommendations for other services: None   Discharge Criteria: Patient will be discharged from TR if patient refuses treatment 3 consecutive times without medical reason.  If treatment goals not met, if there is a change in medical status, if patient makes no progress towards goals or if patient is discharged from hospital.  The above assessment, treatment plan, treatment alternatives and goals were discussed and mutually agreed upon: by patient  [place] 12/04/2019, 12:18 PM

## 2019-12-04 NOTE — Progress Notes (Signed)
[place] PHYSICAL MEDICINE & REHABILITATION PROGRESS NOTE   Subjective/Complaints: Pt without new issues. No problems with trach capped. Slept well.    ROS: Limited due to cognitive/language  Objective:   No results found. No results for input(s): WBC, HGB, HCT, PLT in the last 72 hours. No results for input(s): NA, K, CL, CO2, GLUCOSE, BUN, CREATININE, CALCIUM in the last 72 hours.  Intake/Output Summary (Last 24 hours) at 12/04/2019 1122 Last data filed at 12/04/2019 0900 Gross per 24 hour  Intake --  Output 580 ml  Net -580 ml        Physical Exam: Vital Signs Blood pressure (!) 148/61, pulse 75, temperature 98 F (36.7 C), temperature source Oral, resp. rate 18, height 6\' 1"  (1.854 m), weight 73.4 kg, SpO2 94 %. Constitutional: No distress . Vital signs reviewed. HEENT: EOMI, oral membranes moist Neck:trach capped. Phonates around trach Cardiovascular: RRR without murmur. No JVD    Respiratory/Chest: CTA Bilaterally without wheezes or rales. Normal effort    GI/Abdomen: BS +, sl tender, non-distended, PEG site with slight drainage Ext: no clubbing, cyanosis, or edema Psych: pleasant and cooperative Skin: No evidence of breakdown, no evidence of rash Neurologic: aphasic and apraxic   motor strength is 5/5 inleft deltoid, bicep, tricep, grip, hip flexor, knee extensors, ankle dorsiflexor and plantar flexor 3- RIght delt bi, tri, grip.  4- to 4/5 Right HF, KE, ADF. RUE flexor tone Musculoskeletal: RUE still sl tender with PROM. No substantial subluxation appreciated    Assessment/Plan: 1. 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  Physiatrist is providing close team supervision and 24 hour management of active medical problems listed below.  Physiatrist and rehab team continue to assess barriers to discharge/monitor patient progress toward functional and  medical goals  Care Tool:  Bathing    Body parts bathed by patient: Chest, Abdomen, Face, Right upper leg, Left upper leg, Right lower leg, Left lower leg, Right arm   Body parts bathed by helper: Left arm (declined washing under brief for buttocks and peri)     Bathing assist Assist Level: Moderate Assistance - Patient 50 - 74%     Upper Body Dressing/Undressing Upper body dressing   What is the patient wearing?: Pull over shirt    Upper body assist Assist Level: Minimal Assistance - Patient > 75%    Lower Body Dressing/Undressing Lower body dressing      What is the patient wearing?: Underwear/pull up, Pants     Lower body assist Assist for lower body dressing: Maximal Assistance - Patient 25 - 49%     Toileting Toileting Toileting Activity did not occur (Clothing management and hygiene only): N/A (no void or bm)  Toileting assist Assist for toileting: Maximal Assistance - Patient 25 - 49%     Transfers Chair/bed transfer  Transfers assist     Chair/bed transfer assist level: Minimal Assistance - Patient > 75%     Locomotion Ambulation   Ambulation assist   Ambulation activity did not occur: Safety/medical concerns  Assist level: Minimal Assistance - Patient > 75% Assistive device: Walker-rolling Max distance: 100'   Walk 10 feet activity   Assist  Walk 10 feet activity did not occur: Safety/medical concerns  Assist level: Minimal Assistance - Patient > 75% Assistive device: Walker-rolling   Walk 50 feet activity   Assist Walk 50 feet with 2 turns activity did not occur: Safety/medical  concerns  Assist level: Minimal Assistance - Patient > 75% Assistive device: Walker-rolling    Walk 150 feet activity   Assist Walk 150 feet activity did not occur: Safety/medical concerns         Walk 10 feet on uneven surface  activity   Assist Walk 10 feet on uneven surfaces activity did not occur: Safety/medical concerns          Wheelchair     Assist Will patient use wheelchair at discharge?: Yes Type of Wheelchair: Manual Wheelchair activity did not occur: Safety/medical concerns         Wheelchair 50 feet with 2 turns activity    Assist    Wheelchair 50 feet with 2 turns activity did not occur: Safety/medical concerns       Wheelchair 150 feet activity     Assist  Wheelchair 150 feet activity did not occur: Safety/medical concerns       Blood pressure (!) 148/61, pulse 75, temperature 98 F (36.7 C), temperature source Oral, resp. rate 18, height 6\' 1"  (1.854 m), weight 73.4 kg, SpO2 94 %.    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 Antithrombotics: -DVT/anticoagulation:SCDs -antiplatelet therapy: Aspirin 81 mg daily and Plavix 75 mg daily 3. Pain Management:Tylenol as needed  -  gabapentin 100mg  tid for neuropathic pain RUE very beneficial  -support hemiplegic shoulder  -tone mgt  -pain appears to be well controlled. Not sedated from gabapentin 4. Mood:Provide emotional support -antipsychotic agents: Seroquel 50 mg nightly 5. Neuropsych: This patientis notcapable of making decisions on hisown behalf.  -continue sleep chart to measure sleep hygiene 6. Skin/Wound Care:Routine skin checks 7. Fluids/Electrolytes/Nutrition:Routine in and outs with follow-up chemistries 8. Seizure disorder. Dilantin 175 mg 3 times daily, Keppra 1000 mg twice daily, Vimpat 150 mg twice daily. outpt neuro f/u  -dilantin level low  -appreciate pharmacy f/u  -dilantin dose increased to 200mg  tid with loading dose of 300mg  x 1 9/25  10/1 f/u level ordered for tomorrow 9. Diabetes mellitus. Hemoglobin A1c 7.2. Lantus insulin 6 units nightly, NovoLog 2 units  every 4 hours. CBG (last 3)  Recent Labs    12/04/19 0007 12/04/19 0448 12/04/19 0756  GLUCAP 177* 144* 142*  10/1 fair control, cover elevations with SSI  10. Dysphagia. NPO except for trials with SLP. Status post gastrostomy tube 11/10/2019 per interventional radiology. advance per SLP 11. Hypertension. Hydralazine 50 mg every 8 hours, Cardura 2 mg daily, Norvasc 10 mg daily.    -10/1 reasonable control  Vitals:   12/04/19 0451 12/04/19 0811  BP: (!) 148/61   Pulse: 72 75  Resp: 14 18  Temp: 98 F (36.7 C)   SpO2: 100% 94%    12. Acute on chronic respiratory failure. Status post tracheostomy tube. Presently with a #6 Shiley flexible cuffless trach as of 11/02/2019 followed by pulmonary services Dr. 01/10/2020  -did well with capping. Cap removed at HS  -cap continuously  -appreciate Pulmonary to follow up 13. Hyperlipidemia. Lipitor 14. Urinary retention. rentention/PVR's a little better 180-600cc over last 24     -doxasosin  2mg ---increased to 4mg  9/30   -continue I/o cath prn   -continue urecholine 25mg  tid   LOS: 13 days A FACE TO FACE EVALUATION WAS PERFORMED  02/03/20 12/04/2019, 11:22 AM

## 2019-12-04 NOTE — Progress Notes (Signed)
Occupational Therapy Note  Patient Details  Name: Corey Bennett MRN: [id number] Date of Birth: 03-13-1960  Today's Date: 12/04/2019 OT Missed Time: 45 Minutes Missed Time Reason: Patient fatigue  Pt missed 45 mins scheduled OT treatment session secondary to fatigue.  Pt asleep upon arrival.  Therapist provided tactile cues and cool cloth to face to arouse.  Pt eyes opened and fluttered to stimulation.  Therapist providing encouragement to engage in therapy session with pt shaking head "no".  When asked if he was tired, he shook his head "yes".  RN reports pt removing trach collar and pulse ox overnight, may have been restless overnight.  Will continue to follow per POC.  Rosalio Loud 12/04/2019, 12:22 PM

## 2019-12-04 NOTE — Progress Notes (Addendum)
NAME:  Corey Bennett, MRN:  [id number], DOB:  08/28/1960, LOS: 13 ADMISSION DATE:  11/21/2019, CONSULTATION DATE:  10/17/2019 REFERRING MD:  Ella Jubilee, CHIEF COMPLAINT:  Seizure , possible aspiration   Brief History   59 year old male with past medical history of 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 Patient is living in a assisted living facility and can ambulate by himself. Patient was found by staff to have a right sided twitching with right eye deviation. He was given Versed and Dilantin and was intubated for airway protection.  Patient was extubated on 8/11 and his seizures was controlled with Keppra, phenytoin, and Vimpat.  He was transferred to 3W. for medical management.  On 8/14, patient continued to have seizure with twitching of the right upper extremity.  2 mg of lorazepam was given.  Patient continued to desat in the 80s on 15 L of nonrebreather mask with altered mental status.  Patient was intubated to protect airway and transferred to the ICU.  Past Medical History  Left MCA stroke with residual aphasia and right hemiparesis CEA Hypertension Hypercholesterolemia Diabetes   Significant Hospital Events   8/08intubated in ED 8/11 extubation 8/14 reintubation 8/21 transfusion 1 unit PRBC 8/26 to [facility]  8/30 PCCM asked regarding appropriateness of downsize of trach 9/28 Trach downsized to #4 cuffless   Consults:  Neurology Speech Pathology   Procedures:  8/8 ETT 8/14 ETT 08/19 Tracheostomy  8/23 PEG tube  Significant Diagnostic Tests:  CT head 8/8 >> No acute intracranial abnormality. Large area of encephalomalacia throughout the left MCA territory, consistent with remote infarct. UDS 8/8 >> + benzo (but given versed in ED); o/w negative MRI of brain8/8 >>large territory chronic hemorrhagic infarct in the left frontal lobe.Ill-defined hyperintensity  throughout the left basal ganglia and left thalamus with mild enhancement in dobutamine likely secondary to encephalitis, infiltrating tumor or atypical subacute infarction MRI of face and trigeminal region8/8 >>rules out soft tissue abscess CXR 8/17 >>improved aeration to the right lung base  CT Abd w/o contrast 8/17 >> cholelithiasis/trace ascites/aorthic atherosclerosis  Micro Data:  8/8 Sars-CoV-2 >> negative 8/8 BCx2 >> no growth 8/8 UC >>Klebsiella pneumonia 08/14 BCx >> negative 08/14 Trach Cx >> gram positive rods and cocci in pairs 08/17 Trach Cx >> negative   Antimicrobials:  Vancomycin 8/8 >> 8/12 Ceftriaxone 8/8 >> 8/12 Ampicillin 8/8 >> 8/11 Zosyn 8/14 >> 08/21 Vancomycin 08/14 >> 08/16  Interim history/subjective:  Feels well   Objective   Blood pressure (Abnormal) 148/61, pulse 78, temperature 98 F (36.7 C), temperature source Oral, resp. rate 18, height 6\' 1"  (1.854 m), weight 73.4 kg, SpO2 95 %.        Intake/Output Summary (Last 24 hours) at 12/04/2019 1404 Last data filed at 12/04/2019 0900 Gross per 24 hour  Intake no documentation  Output 580 ml  Net -580 ml   Filed Weights   12/02/19 0444 12/03/19 0359 12/04/19 0500  Weight: 71.1 kg 73.3 kg 73.4 kg    Examination: General this is a 59 year old white male. Now sitting up in bed on own strength was able to change position w/out assist.  HENT still has facial droop but is a little dysarthric but is able to complete multi-word phrases w/out shortness of breath w/ trac capped -I decannulated him during stay. Placed new occlusive dressing. His phonation quality is excellent (actually sounded perhaps a little better than when trach was in place  Pulm on room air. 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 The quality of his voice and cough are excellent. I have now decannulated him. Looks good.   Plan Keep current trach dressing in place for at least 24-48 hours. After that we can just change daily w/ regular bandage until stoma is closed.  Would encourage him to splint his stoma when he is coughing And also to enhance his voice quality  No submerging under water OK to continue current diet restrictions If stoma still open/not closed on 10-14 days would need referral to ENT to evaluate for tracheocutaneous fistula closure   Simonne Martinet ACNP-BC [facility] Pulmonary/Critical Care Pager # [phone] OR # [phone] if no answer

## 2019-12-05 ENCOUNTER — Inpatient Hospital Stay (HOSPITAL_COMMUNITY): Payer: BC Managed Care – PPO

## 2019-12-05 ENCOUNTER — Inpatient Hospital Stay (HOSPITAL_COMMUNITY): Payer: BC Managed Care – PPO | Admitting: Physical Therapy

## 2019-12-05 LAB — BASIC METABOLIC PANEL
Anion gap: 10 (ref 5–15)
BUN: 33 mg/dL — ABNORMAL HIGH (ref 6–20)
CO2: 27 mmol/L (ref 22–32)
Calcium: 9 mg/dL (ref 8.9–10.3)
Chloride: 104 mmol/L (ref 98–111)
Creatinine, Ser: 1.29 mg/dL — ABNORMAL HIGH (ref 0.61–1.24)
GFR calc Af Amer: >60 mL/min (ref >60)
GFR calc non Af Amer: >60 mL/min (ref >60)
Glucose, Bld: 104 mg/dL — ABNORMAL HIGH (ref 70–99)
Potassium: 3.6 mmol/L (ref 3.5–5.1)
Sodium: 141 mmol/L (ref 135–145)

## 2019-12-05 LAB — GLUCOSE, CAPILLARY
Glucose-Capillary: 165 mg/dL — ABNORMAL HIGH (ref 70–99)
Glucose-Capillary: 142 mg/dL — ABNORMAL HIGH (ref 70–99)
Glucose-Capillary: 164 mg/dL — ABNORMAL HIGH (ref 70–99)
Glucose-Capillary: 212 mg/dL — ABNORMAL HIGH (ref 70–99)
Glucose-Capillary: 175 mg/dL — ABNORMAL HIGH (ref 70–99)

## 2019-12-05 LAB — PHENYTOIN LEVEL, TOTAL: Phenytoin Lvl: 15.5 ug/mL (ref 10.0–20.0)

## 2019-12-05 NOTE — Progress Notes (Signed)
Physical Therapy Session Note  Patient Details  Name: Corey Bennett MRN: [id number] Date of Birth: [birth year]  Today's Date: 12/05/2019 PT Individual Time: 1455-1530 PT Individual Time Calculation (min): 35 min   and  Today's Date: 12/05/2019 PT Missed Time: 10 Minutes Missed Time Reason: Patient fatigue  Short Term Goals: Week 2:  PT Short Term Goal 1 (Week 2): Pt will consistently complete bed mobility with CGA. PT Short Term Goal 2 (Week 2): Pt will complete transfers with LRAD & CGA. PT Short Term Goal 3 (Week 2): Pt will ambulate 50 ft with LRAD & CGA.  Skilled Therapeutic Interventions/Progress Updates:    Pt received supine in bed with NT present for routine vitals and RN present to disconnect tube feeds for therapy session. Pt agreeable to therapy session - remains aphasic with receptive more intact than expressive though pt able to respond with yes/no head nodes and was able to verbalize a few short phrases during session. Supine>sitting L EOB, HOB elevated, with min assist for trunk upright and mod assist for scooting to EOB. Sitting EOB donned shorts, socks, and shoes max assist for time management - supervision for trunk control with pt using L UE support on bed as needed for balance. Sit>stand EOB>RW with min assist for lifting into standing and balance due to moderate posterior lean pushing backs of legs onto bed. Once in standing pt started breathing very quickly, panting with eyes wide - due to aphasia unable to express his symptoms. Returned to sitting EOB. Assessed sitting vitals: BP 133/70 (MAP 88), HR 76bpm and returned to standing with pt performing same breathing with eyes wide though slightly less severe and reassessed standing vitals: BP 112/77 (MAP 88), HR 76bpm with pt unable to maintain standing full time to complete dynamap reading prior to sitting down EOB. Therapist asked yes/no questions to understand pt's symptoms and when asking about experiencing "lightheadedness" pt  responds "no" and then does an exaggerated grin indicating sarcasm, which pt confirmed; he denies pain. Reassessed sitting vitals: BP 130/75, HR 75bpm. Pt declines further OOB mobility at this time and declines transferring to w/c despite education on improved upright OOB activity tolerance (of note: pt received COVID shot 2 days prior). Therapist donned B LE thigh high TED hose. Sit>supine with min assist for trunk descent. Pt left in chair position of bed for more upright with needs in reach, R UE supported on pillows, bed alarm on, and lines intact. Missed 10 minutes of skilled physical therapy.  Therapy Documentation Precautions:  Precautions Precautions: Fall Precaution Comments: PEG, decannulated on 12/04/2019 Restrictions Weight Bearing Restrictions: No  Pain: Denies pain during session.   Therapy/Group: Individual Therapy  Ginny Forth , PT, DPT, CSRS  12/05/2019, 12:30 PM

## 2019-12-05 NOTE — Plan of Care (Signed)
  Problem: RH BLADDER ELIMINATION Goal: RH STG MANAGE BLADDER WITH ASSISTANCE Description: STG Manage Bladder With min Assistance Outcome: Progressing Goal: RH STG MANAGE BLADDER WITH MEDICATION WITH ASSISTANCE Description: STG Manage Bladder With Medication With min Assistance. Outcome: Progressing   Problem: RH SKIN INTEGRITY Goal: RH STG SKIN FREE OF INFECTION/BREAKDOWN Description: No new skin breakdown this admission Outcome: Progressing Goal: RH STG MAINTAIN SKIN INTEGRITY WITH ASSISTANCE Description: STG Maintain Skin Integrity With min Assistance. Outcome: Progressing

## 2019-12-05 NOTE — Progress Notes (Signed)
Occupational Therapy Session Note  Patient Details  Name: Corey Bennett MRN: [id number] Date of Birth: 1960/10/09  Today's Date: 12/05/2019 OT Individual Time: 2004-1593 OT Individual Time Calculation (min): 39 min    Short Term Goals: Week 1:  OT Short Term Goal 1 (Week 1): Pt will tolerate therapeutic activity for ~15 min in un supported sitting to improved cardiopulmonary endurance OT Short Term Goal 1 - Progress (Week 1): Progressing toward goal OT Short Term Goal 2 (Week 1): Pt will perform sit to stands inADL tasks with contact guard OT Short Term Goal 2 - Progress (Week 1): Progressing toward goal OT Short Term Goal 3 (Week 1): Pt will don shirt with hemi dressing techniques with min A OT Short Term Goal 3 - Progress (Week 1): Met OT Short Term Goal 4 (Week 1): Pt will perform functional transfer (stand pivot with min A ) OT Short Term Goal 4 - Progress (Week 1): Met  Skilled Therapeutic Interventions/Progress Updates:    OT session focused on balance, following one-step directions, activity tolerance, and transfers. Pt received asleep, requiring increased time to awaken. OT utilized gestures, simple verbal prompts, and writing throughout session with fair understanding. Pt transitioned to sitting EOB with min A and HOB elevated then stand pivot transfer to w/c with min A. Presented 2 choices of pants with pt identifying preference. Upon attempts to complete, pt began to decline and attempting to return to bed. OT provided min A with pt indicating grimacing in LLE during sit>supine. Due to dysarthria and aphasia, unable to determine severity, however it appeared to stop. OT positioned RUE on pillow with pt becoming tearful. OT provided emotional support with pt attempting to communicate verbally and written, however unable to to fully identify feelings. At end of session, pt left with all needs in reach and bed alarm set.   Therapy Documentation Precautions:  Precautions Precautions:  Fall Precaution Comments: trach, PEG, trach collar at 28% Restrictions Weight Bearing Restrictions: No General:   Vital Signs:  Pain: Pain Assessment Pain Scale: 0-10 Pain Score: 1  Pain Type: Chronic pain Pain Location: Generalized Pain Descriptors / Indicators: Aching Pain Intervention(s): Medication (See eMAR) ADL: ADL Eating: NPO Upper Body Bathing: Moderate assistance Where Assessed-Upper Body Bathing: Sitting at sink Lower Body Bathing: Moderate assistance Where Assessed-Lower Body Bathing: Sitting at sink Upper Body Dressing: Moderate assistance Where Assessed-Upper Body Dressing: Sitting at sink Lower Body Dressing: Maximal assistance Where Assessed-Lower Body Dressing: Sitting at sink Toileting: Maximal assistance Where Assessed-Toileting: Bedside Commode Toilet Transfer: Minimal assistance Toilet Transfer Method: Stand pivot Toilet Transfer Equipment: Bedside commode Vision   Perception    Praxis   Exercises:   Other Treatments:     Therapy/Group: Individual Therapy  Duayne Cal 12/05/2019, 12:38 PM

## 2019-12-05 NOTE — Progress Notes (Signed)
[facility] PHYSICAL MEDICINE & REHABILITATION PROGRESS NOTE   Subjective/Complaints: No complaints Hypertensive Sleeping but easily arousable  ROS: Limited due to cognitive/language  Objective:   No results found. No results for input(s): WBC, HGB, HCT, PLT in the last 72 hours. No results for input(s): NA, K, CL, CO2, GLUCOSE, BUN, CREATININE, CALCIUM in the last 72 hours.  Intake/Output Summary (Last 24 hours) at 12/05/2019 1456 Last data filed at 12/05/2019 1340 Gross per 24 hour  Intake --  Output 1595 ml  Net -1595 ml        Physical Exam: Vital Signs Blood pressure (!) 151/70, pulse 77, temperature 98.5 F (36.9 C), resp. rate 15, height 6\' 1"  (1.854 m), weight 72.1 kg, SpO2 99 %. General: Alert, No apparent distress HEENT: Head is normocephalic, atraumatic, PERRLA, EOMI, sclera anicteric, oral mucosa pink and moist, dentition intact, ext ear canals clear,  Neck: Supple without JVD or lymphadenopathy Heart: Reg rate and rhythm. No murmurs rubs or gallops Chest: CTA bilaterally without wheezes, rales, or rhonchi; no distress Abdomen: Soft, non-tender, non-distended, bowel sounds positive. Psych: pleasant and cooperative Skin: No evidence of breakdown, no evidence of rash Neurologic: aphasic and apraxic   motor strength is 5/5 inleft deltoid, bicep, tricep, grip, hip flexor, knee extensors, ankle dorsiflexor and plantar flexor 3- RIght delt bi, tri, grip.  4- to 4/5 Right HF, KE, ADF. RUE flexor tone Musculoskeletal: RUE still sl tender with PROM. No substantial subluxation appreciated      Assessment/Plan: 1. 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  Physiatrist is providing close team supervision and 24 hour management of active medical problems listed below.  Physiatrist and rehab team continue to assess barriers to discharge/monitor patient  progress toward functional and medical goals  Care Tool:  Bathing    Body parts bathed by patient: Chest, Abdomen, Face, Right upper leg, Left upper leg, Right lower leg, Left lower leg, Right arm   Body parts bathed by helper: Left arm (declined washing under brief for buttocks and peri)     Bathing assist Assist Level: Moderate Assistance - Patient 50 - 74%     Upper Body Dressing/Undressing Upper body dressing   What is the patient wearing?: Pull over shirt    Upper body assist Assist Level: Minimal Assistance - Patient > 75%    Lower Body Dressing/Undressing Lower body dressing      What is the patient wearing?: Underwear/pull up, Pants     Lower body assist Assist for lower body dressing: Maximal Assistance - Patient 25 - 49%     Toileting Toileting Toileting Activity did not occur (Clothing management and hygiene only): N/A (no void or bm)  Toileting assist Assist for toileting: Maximal Assistance - Patient 25 - 49%     Transfers Chair/bed transfer  Transfers assist     Chair/bed transfer assist level: Minimal Assistance - Patient > 75%     Locomotion Ambulation   Ambulation assist   Ambulation activity did not occur: Safety/medical concerns  Assist level: 2 helpers Assistive device: Walker-rolling Max distance: 110'   Walk 10 feet activity   Assist  Walk 10 feet activity did not occur: Safety/medical concerns  Assist level: 2 helpers Assistive device: Walker-rolling   Walk 50 feet activity   Assist Walk 50 feet with 2 turns activity did not occur: Safety/medical concerns  Assist level: 2 helpers Assistive device:  Walk 150 feet activity   Assist Walk 150 feet activity did not occur: Safety/medical concerns  Assist level: 2 helpers Assistive device: Walker-rolling    Walk 10 feet on uneven surface  activity   Assist Walk 10 feet on uneven surfaces activity did not occur: Safety/medical concerns          Wheelchair     Assist Will patient use wheelchair at discharge?: Yes Type of Wheelchair: Manual Wheelchair activity did not occur: Safety/medical concerns         Wheelchair 50 feet with 2 turns activity    Assist    Wheelchair 50 feet with 2 turns activity did not occur: Safety/medical concerns       Wheelchair 150 feet activity     Assist  Wheelchair 150 feet activity did not occur: Safety/medical concerns       Blood pressure (!) 151/70, pulse 77, temperature 98.5 F (36.9 C), resp. rate 15, height 6\' 1"  (1.854 m), weight 72.1 kg, SpO2 99 %.    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 Antithrombotics: -DVT/anticoagulation:SCDs -antiplatelet therapy: Aspirin 81 mg daily and Plavix 75 mg daily 3. Pain Management:Tylenol as needed  -  gabapentin 100mg  tid for neuropathic pain RUE very beneficial  -support hemiplegic shoulder  -tone mgt  -pain appears to be well controlled. Not sedated from gabapentin 4. Mood:Provide emotional support -antipsychotic agents: Seroquel 50 mg nightly 5. Neuropsych: This patientis notcapable of making decisions on hisown behalf.  -continue sleep chart to measure sleep hygiene 6. Skin/Wound Care:Routine skin checks 7. Fluids/Electrolytes/Nutrition:Routine in and outs with follow-up chemistries 8. Seizure disorder. Dilantin 175 mg 3 times daily, Keppra 1000 mg twice daily, Vimpat 150 mg twice daily. outpt neuro f/u  -dilantin level low  -appreciate pharmacy f/u  -dilantin dose increased to 200mg  tid with loading dose of 300mg  x 1 9/25  10/1 f/u level ordered for tomorrow  10/2: Phenytoin level is 15.5 9. Diabetes mellitus. Hemoglobin A1c 7.2. Lantus insulin 6 units nightly, NovoLog  2 units every 4 hours. CBG (last 3)  Recent Labs    12/05/19 0440 12/05/19 0726 12/05/19 1117  GLUCAP 165* 142* 164*  10/2: fair control 10. Dysphagia. NPO except for trials with SLP. Status post gastrostomy tube 11/10/2019 per interventional radiology. advance per SLP 11. Hypertension. Hydralazine 50 mg every 8 hours, Cardura 2 mg daily, Norvasc 10 mg daily.    -10/1 reasonable control  Vitals:   12/05/19 0328 12/05/19 0409  BP: (!) 151/70   Pulse: 77 77  Resp: 18 15  Temp: 98.5 F (36.9 C)   SpO2: 99% 99%    12. Acute on chronic respiratory failure. Status post tracheostomy tube. Presently with a #6 Shiley flexible cuffless trach as of 11/02/2019 followed by pulmonary services Dr. 01/10/2020  -did well with capping. Cap removed at HS  -cap continuously  -appreciate Pulmonary to follow up 13. Hyperlipidemia. Lipitor 14. Urinary retention. rentention/PVR's a little better 180-600cc over last 24     -doxasosin  2mg ---increased to 4mg  9/30   -continue I/o cath prn   -continue urecholine 25mg  tid   LOS: 14 days A FACE TO FACE EVALUATION WAS PERFORMED  02/04/20 P Athan Casalino 12/05/2019, 2:56 PM

## 2019-12-06 ENCOUNTER — Inpatient Hospital Stay (HOSPITAL_COMMUNITY): Payer: BC Managed Care – PPO | Admitting: Physical Therapy

## 2019-12-06 LAB — COMPREHENSIVE METABOLIC PANEL
ALT: 46 U/L — ABNORMAL HIGH (ref 0–44)
AST: 46 U/L — ABNORMAL HIGH (ref 15–41)
Albumin: 3 g/dL — ABNORMAL LOW (ref 3.5–5.0)
Alkaline Phosphatase: 152 U/L — ABNORMAL HIGH (ref 38–126)
Anion gap: 13 (ref 5–15)
BUN: 35 mg/dL — ABNORMAL HIGH (ref 6–20)
CO2: 23 mmol/L (ref 22–32)
Calcium: 8.8 mg/dL — ABNORMAL LOW (ref 8.9–10.3)
Chloride: 104 mmol/L (ref 98–111)
Creatinine, Ser: 1.26 mg/dL — ABNORMAL HIGH (ref 0.61–1.24)
GFR calc Af Amer: >60 mL/min (ref >60)
GFR calc non Af Amer: >60 mL/min (ref >60)
Glucose, Bld: 151 mg/dL — ABNORMAL HIGH (ref 70–99)
Potassium: 3.9 mmol/L (ref 3.5–5.1)
Sodium: 140 mmol/L (ref 135–145)
Total Bilirubin: 0.4 mg/dL (ref 0.3–1.2)
Total Protein: 6.7 g/dL (ref 6.5–8.1)

## 2019-12-06 LAB — GLUCOSE, CAPILLARY
Glucose-Capillary: 149 mg/dL — ABNORMAL HIGH (ref 70–99)
Glucose-Capillary: 150 mg/dL — ABNORMAL HIGH (ref 70–99)
Glucose-Capillary: 150 mg/dL — ABNORMAL HIGH (ref 70–99)
Glucose-Capillary: 156 mg/dL — ABNORMAL HIGH (ref 70–99)
Glucose-Capillary: 164 mg/dL — ABNORMAL HIGH (ref 70–99)
Glucose-Capillary: 178 mg/dL — ABNORMAL HIGH (ref 70–99)

## 2019-12-06 MED ORDER — ESCITALOPRAM OXALATE 10 MG PO TABS
5.0000 mg | ORAL_TABLET | Freq: Every day | ORAL | Status: DC
  Administered 2019-12-06 – 2019-12-14 (×9): 5 mg
  Filled 2019-12-06 (×9): qty 1

## 2019-12-06 MED ORDER — DM-GUAIFENESIN ER 30-600 MG PO TB12: 1.0000 | ORAL_TABLET | Freq: Two times a day (BID) | ORAL | Status: DC

## 2019-12-06 MED ORDER — TAMSULOSIN HCL 0.4 MG PO CAPS: 0.4000 mg | ORAL_CAPSULE | Freq: Every day | ORAL | Status: DC

## 2019-12-06 NOTE — Plan of Care (Signed)
  Problem: RH BOWEL ELIMINATION Goal: RH STG MANAGE BOWEL WITH ASSISTANCE Description: STG Manage Bowel with min Assistance. Outcome: Progressing   Problem: RH SKIN INTEGRITY Goal: RH STG SKIN FREE OF INFECTION/BREAKDOWN Description: No new skin breakdown this admission Outcome: Progressing Goal: RH STG MAINTAIN SKIN INTEGRITY WITH ASSISTANCE Description: STG Maintain Skin Integrity With min Assistance. Outcome: Progressing   

## 2019-12-06 NOTE — Progress Notes (Addendum)
Physical Therapy Session Note  Patient Details  Name: Corey Bennett MRN: [id number] Date of Birth: 1960/04/23  Today's Date: 12/06/2019 PT Individual Time: 1115-1200 PT Individual Time Calculation (min): 45 min   Short Term Goals: Week 2:  PT Short Term Goal 1 (Week 2): Pt will consistently complete bed mobility with CGA. PT Short Term Goal 2 (Week 2): Pt will complete transfers with LRAD & CGA. PT Short Term Goal 3 (Week 2): Pt will ambulate 50 ft with LRAD & CGA.  Skilled Therapeutic Interventions/Progress Updates: Pt presented in bed agreeable to therapy. Pt points at PTA when questioned regarding pain. Per nsg ok to stop tube feed during therapy session. Performed supine to sit at EOB with use of bed features and CGA with increased time. Upon sitting EOB PTA noted that bed was soiled under pt due to PEG leaking. Performed STS with CGA from EOB and pt was able to maintain fair standing balance while PTA changed brief and performed peri-care to remove residual feeding liquid. Pt then performed stand pivot to w/c with RW and minA with cues for sequencing and safety. Pt then reaching for PTA's hand and brought to PEG tube site. Pt nodding head "yes" when asked if needs nsg. Upon nsg arrival noted that pt wanted gauze placed around PEG site. Once completed pt was provided with choice of shorts and was able to thread from w/c with minA. Pt then performed STS with CGA from RW and PTA provided minA for pulling up shorts. Once completed pt transported to rehab gym for time management and performed ambulatory transfer to mat from w/c with RW and minA.  Participated in STS with toe taps to 2in step x 5 L/R then x5 alternating. Pt required increased rest break due to increased respiratory however was able to lower with relaxation cues and calming tones. Pt then performed ambulatory transfer to return to w/c in same manner as prior and transported back to room. Pt pointing at bed when given choice between bed and  w/c. Performed stand pivot to bed no AD with minA. Pt also required minA for sit to supine transfer. Pt noted to have another episode of anxiety with increased RR however pt was able to calm self with PTA providing cues for calming breaths and pt holding PTA's hand. Pt repositioned to comfort and left with bed alarm on, call bell within reach and needs met.   Tx2: Pt in bed sleeping, made several attempts to arouse pt but pt unable to arouse despite tactile and vocal stimulation. Pt missed 45 min skilled PT due to fatigue and unwillingness to participate.      Therapy Documentation Precautions:  Precautions Precautions: Fall Precaution Comments: PEG, decannulated on 12/04/2019 Restrictions Weight Bearing Restrictions: No General: PT Amount of Missed Time (min): 45 Minutes PT Missed Treatment Reason: Patient fatigue;Patient unwilling to participate Vital Signs: Therapy Vitals Temp: 98.2 F (36.8 C) Pulse Rate: 72 Resp: 14 BP: (!) 148/69 Patient Position (if appropriate): Lying Oxygen Therapy SpO2: 99 % O2 Device: Room Air Pain: Pain Assessment Pain Scale: 0-10 Pain Score: 1  Pain Type: Acute pain Pain Location: Generalized Pain Descriptors / Indicators: Aching Pain Onset: On-going Pain Intervention(s): Medication (See eMAR) Mobility:   Locomotion :    Trunk/Postural Assessment :    Balance:   Exercises:   Other Treatments:      Therapy/Group: Individual Therapy  Corey Bennett 12/06/2019, 3:35 PM

## 2019-12-06 NOTE — Progress Notes (Signed)
[facility] PHYSICAL MEDICINE & REHABILITATION PROGRESS NOTE   Subjective/Complaints: Brother is a bedside- he would like to be notified which day patient will have MBS.  Cr improved today.  Brother notes continued phlegm- mucinex ordered  ROS: Limited due to cognitive/language  Objective:   No results found. No results for input(s): WBC, HGB, HCT, PLT in the last 72 hours. Recent Labs    12/05/19 0630 12/06/19 0920  NA 141 140  K 3.6 3.9  CL 104 104  CO2 27 23  GLUCOSE 104* 151*  BUN 33* 35*  CREATININE 1.29* 1.26*  CALCIUM 9.0 8.8*    Intake/Output Summary (Last 24 hours) at 12/06/2019 1335 Last data filed at 12/06/2019 7628 Gross per 24 hour  Intake --  Output 2000 ml  Net -2000 ml     Physical Exam: Vital Signs Blood pressure (!) 148/69, pulse 72, temperature 98.2 F (36.8 C), resp. rate 14, height 6\' 1"  (1.854 m), weight 71.6 kg, SpO2 99 %. General: Alert and oriented x 3, No apparent distress HEENT: Head is normocephalic, atraumatic, PERRLA, EOMI, sclera anicteric, oral mucosa pink and moist, dentition intact, ext ear canals clear,  Neck: Supple without JVD or lymphadenopathy Heart: Reg rate and rhythm. No murmurs rubs or gallops Chest: CTA bilaterally without wheezes, rales, or rhonchi; no distress Abdomen: Soft, non-tender, non-distended, bowel sounds positive. Psych: pleasant and cooperative Skin: No evidence of breakdown, no evidence of rash Neurologic: aphasic and apraxic   motor strength is 5/5 inleft deltoid, bicep, tricep, grip, hip flexor, knee extensors, ankle dorsiflexor and plantar flexor 3- RIght delt bi, tri, grip.  4- to 4/5 Right HF, KE, ADF. RUE flexor tone Musculoskeletal: RUE still sl tender with PROM. No substantial subluxation appreciated  Assessment/Plan: 1. 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  Physiatrist is providing  close team supervision and 24 hour management of active medical problems listed below.  Physiatrist and rehab team continue to assess barriers to discharge/monitor patient progress toward functional and medical goals  Care Tool:  Bathing    Body parts bathed by patient: Chest, Abdomen, Face, Right upper leg, Left upper leg, Right lower leg, Left lower leg, Right arm   Body parts bathed by helper: Left arm (declined washing under brief for buttocks and peri)     Bathing assist Assist Level: Moderate Assistance - Patient 50 - 74%     Upper Body Dressing/Undressing Upper body dressing   What is the patient wearing?: Pull over shirt    Upper body assist Assist Level: Minimal Assistance - Patient > 75%    Lower Body Dressing/Undressing Lower body dressing      What is the patient wearing?: Underwear/pull up, Pants     Lower body assist Assist for lower body dressing: Maximal Assistance - Patient 25 - 49%     Toileting Toileting Toileting Activity did not occur (Clothing management and hygiene only): N/A (no void or bm)  Toileting assist Assist for toileting: Maximal Assistance - Patient 25 - 49%     Transfers Chair/bed transfer  Transfers assist     Chair/bed transfer assist level: Minimal Assistance - Patient > 75%     Locomotion Ambulation   Ambulation assist   Ambulation activity did not occur: Safety/medical concerns  Assist level: 2 helpers Assistive device: Walker-rolling Max distance: 110'   Walk 10 feet activity   Assist  Walk 10 feet activity did  not occur: Safety/medical concerns  Assist level: 2 helpers Assistive device: Walker-rolling   Walk 50 feet activity   Assist Walk 50 feet with 2 turns activity did not occur: Safety/medical concerns  Assist level: 2 helpers Assistive device: Walker-rolling    Walk 150 feet activity   Assist Walk 150 feet activity did not occur: Safety/medical concerns  Assist level: 2 helpers Assistive  device: Walker-rolling    Walk 10 feet on uneven surface  activity   Assist Walk 10 feet on uneven surfaces activity did not occur: Safety/medical concerns         Wheelchair     Assist Will patient use wheelchair at discharge?: Yes Type of Wheelchair: Manual Wheelchair activity did not occur: Safety/medical concerns         Wheelchair 50 feet with 2 turns activity    Assist    Wheelchair 50 feet with 2 turns activity did not occur: Safety/medical concerns       Wheelchair 150 feet activity     Assist  Wheelchair 150 feet activity did not occur: Safety/medical concerns       Blood pressure (!) 148/69, pulse 72, temperature 98.2 F (36.8 C), resp. rate 14, height 6\' 1"  (1.854 m), weight 71.6 kg, SpO2 99 %.    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 Antithrombotics: -DVT/anticoagulation:SCDs -antiplatelet therapy: Aspirin 81 mg daily and Plavix 75 mg daily 3. Pain Management:Tylenol as needed  -  gabapentin 100mg  tid for neuropathic pain RUE very beneficial  -support hemiplegic shoulder  -tone mgt  -pain appears to be well controlled. Not sedated from gabapentin  -well controlled 4. Mood:Provide emotional support -antipsychotic agents: Seroquel 50 mg nightly 5. Neuropsych: This patientis notcapable of making decisions on hisown behalf.  -continue sleep chart to measure sleep hygiene 6. Skin/Wound Care:Routine skin checks 7. Fluids/Electrolytes/Nutrition:Routine in and outs with follow-up chemistries 8. Seizure disorder. Dilantin 175 mg 3 times daily, Keppra 1000 mg twice daily, Vimpat 150 mg twice daily. outpt neuro f/u  -dilantin level low  -appreciate pharmacy f/u  -dilantin dose increased to 200mg  tid  with loading dose of 300mg  x 1 9/25  10/1 f/u level ordered for tomorrow  10/2: Phenytoin level is 15.5  10/3: discussed with brother that patient will continue on medications until outpatient f/u with neurology.  9. Diabetes mellitus. Hemoglobin A1c 7.2. Lantus insulin 6 units nightly, NovoLog 2 units every 4 hours. CBG (last 3)  Recent Labs    12/06/19 0351 12/06/19 0752 12/06/19 1213  GLUCAP 150* 149* 178*  10/3: slightly elevated.  10. Dysphagia. NPO except for trials with SLP. Status post gastrostomy tube 11/10/2019 per interventional radiology. advance per SLP. Brother would like to be notified what date he may have MBS next week.  11. Hypertension. Hydralazine 50 mg every 8 hours, Cardura 2 mg daily, Norvasc 10 mg daily.    -10/1 reasonable control  Vitals:   12/06/19 0350 12/06/19 1322  BP: (!) 141/66 (!) 148/69  Pulse: 75 72  Resp: 16 14  Temp: 98 F (36.7 C) 98.2 F (36.8 C)  SpO2: 100% 99%    12. Acute on chronic respiratory failure. Status post tracheostomy tube. Presently with a #6 Shiley flexible cuffless trach as of 11/02/2019 followed by pulmonary services Dr. 01/10/2020  -did well with capping. Cap removed at HS  -cap continuously  -  appreciate Pulmonary to follow up 13. Hyperlipidemia. Lipitor 14. Urinary retention. rentention/PVR's a little better 180-600cc over last 24     -doxasosin  2mg ---increased to 4mg  9/30   -continue I/o cath prn   -continue urecholine 25mg  tid   LOS: 15 days A FACE TO FACE EVALUATION WAS PERFORMED  Paulmichael Schreck P Jihaad Bruschi 12/06/2019, 1:35 PM

## 2019-12-07 ENCOUNTER — Inpatient Hospital Stay (HOSPITAL_COMMUNITY): Payer: BC Managed Care – PPO | Admitting: Occupational Therapy

## 2019-12-07 ENCOUNTER — Inpatient Hospital Stay (HOSPITAL_COMMUNITY): Payer: BC Managed Care – PPO | Admitting: Physical Therapy

## 2019-12-07 ENCOUNTER — Inpatient Hospital Stay (HOSPITAL_COMMUNITY): Payer: BC Managed Care – PPO | Admitting: Speech Pathology

## 2019-12-07 ENCOUNTER — Inpatient Hospital Stay (HOSPITAL_COMMUNITY): Payer: BC Managed Care – PPO

## 2019-12-07 LAB — CBC
HCT: 28.5 % — ABNORMAL LOW (ref 39.0–52.0)
Hemoglobin: 9.4 g/dL — ABNORMAL LOW (ref 13.0–17.0)
MCH: 31 pg (ref 26.0–34.0)
MCHC: 33 g/dL (ref 30.0–36.0)
MCV: 94.1 fL (ref 80.0–100.0)
Platelets: 224 10*3/uL (ref 150–400)
RBC: 3.03 MIL/uL — ABNORMAL LOW (ref 4.22–5.81)
RDW: 13.8 % (ref 11.5–15.5)
WBC: 5.8 10*3/uL (ref 4.0–10.5)
nRBC: 0 % (ref 0.0–0.2)

## 2019-12-07 LAB — GLUCOSE, CAPILLARY
Glucose-Capillary: 110 mg/dL — ABNORMAL HIGH (ref 70–99)
Glucose-Capillary: 147 mg/dL — ABNORMAL HIGH (ref 70–99)
Glucose-Capillary: 149 mg/dL — ABNORMAL HIGH (ref 70–99)
Glucose-Capillary: 160 mg/dL — ABNORMAL HIGH (ref 70–99)
Glucose-Capillary: 182 mg/dL — ABNORMAL HIGH (ref 70–99)
Glucose-Capillary: 188 mg/dL — ABNORMAL HIGH (ref 70–99)

## 2019-12-07 LAB — BASIC METABOLIC PANEL
Anion gap: 10 (ref 5–15)
BUN: 34 mg/dL — ABNORMAL HIGH (ref 6–20)
CO2: 27 mmol/L (ref 22–32)
Calcium: 9 mg/dL (ref 8.9–10.3)
Chloride: 104 mmol/L (ref 98–111)
Creatinine, Ser: 1.24 mg/dL (ref 0.61–1.24)
GFR calc Af Amer: >60 mL/min (ref >60)
GFR calc non Af Amer: >60 mL/min (ref >60)
Glucose, Bld: 136 mg/dL — ABNORMAL HIGH (ref 70–99)
Potassium: 3.3 mmol/L — ABNORMAL LOW (ref 3.5–5.1)
Sodium: 141 mmol/L (ref 135–145)

## 2019-12-07 MED ORDER — GABAPENTIN 250 MG/5ML PO SOLN
200.0000 mg | Freq: Three times a day (TID) | ORAL | Status: DC
  Administered 2019-12-07 – 2019-12-15 (×23): 200 mg
  Filled 2019-12-07 (×25): qty 4

## 2019-12-07 MED ORDER — PHENYTOIN 125 MG/5ML PO SUSP
175.0000 mg | Freq: Every day | ORAL | Status: DC
  Administered 2019-12-09 – 2019-12-10 (×2): 175 mg
  Filled 2019-12-07 (×5): qty 8

## 2019-12-07 MED ORDER — PHENYTOIN 125 MG/5ML PO SUSP
200.0000 mg | ORAL | Status: DC
  Administered 2019-12-07 – 2019-12-11 (×8): 200 mg
  Filled 2019-12-07 (×8): qty 8

## 2019-12-07 MED ORDER — BETHANECHOL CHLORIDE 25 MG PO TABS
50.0000 mg | ORAL_TABLET | Freq: Three times a day (TID) | ORAL | Status: DC
  Administered 2019-12-07 – 2019-12-15 (×24): 50 mg
  Filled 2019-12-07 (×24): qty 2

## 2019-12-07 MED ORDER — INSULIN ASPART 100 UNIT/ML ~~LOC~~ SOLN
3.0000 [IU] | SUBCUTANEOUS | Status: DC
  Administered 2019-12-07 – 2019-12-11 (×17): 3 [IU] via SUBCUTANEOUS

## 2019-12-07 NOTE — Progress Notes (Signed)
Patient ID: Darrell Hauk, male   DOB: 06-Mar-1960, 59 y.o.   MRN: [id number]  SW returned phone call to pt mother [name] [phone]) to discuss d/c plan for patient, and confirm if they would like to have Dr. Mauro Kaufmann as PCP. SW discussed current barriers with SNF locations in [place], and waiting on updates from [facility] as it is pending insurance approval. SW suggested considering a local SNF until pt is doing better, and can transition to home.   SW received phone call from [employer] to inquire about IPR referral from [facility]. SW did confirm this is the d.c plan if accepted. States review can take 24-48hrs for an update.States there will be follow-up once a decision is made.   SWleft message forLacey/RN Coordinator with [facility] (p:[phone]/[phone])to inform on above with regard.  Cecile Sheerer, MSW, LCSWA Office: [phone] Cell: [phone] Fax: [phone]

## 2019-12-07 NOTE — Progress Notes (Signed)
Nutrition Follow-up  DOCUMENTATION CODES:   Non-severe (moderate) malnutrition in context of chronic illness  INTERVENTION:   Continue tube feeding via PEG: - Osmolite 1.5 @ 70 ml/hr to run over 20 hours (tube feeds can be held for up to 4 hours for therapies) - ProSource TF 45 ml BID - Free water flushes per MD/PA  Tube feeding regimen provides2180kcal, 110grams of protein, and 1040m of H2O.  NUTRITION DIAGNOSIS:   Moderate Malnutrition related to chronic illness (stroke with dysphagia) as evidenced by mild fat depletion, moderate fat depletion, mild muscle depletion, moderate muscle depletion.  Ongoing  GOAL:   Patient will meet greater than or equal to 90% of their needs  Met via TF  MONITOR:   Diet advancement, Labs, Weight trends, TF tolerance, Skin, I & O's  REASON FOR ASSESSMENT:   Consult Enteral/tube feeding initiation and management  ASSESSMENT:   59year old male with PMH of DM, HTN, HLD, left MCA CVA 06/2019 with residual aphasia and right-sided weakness as well as left CEA 09/2019. Presented 10/10/19 with seizure-like activity requiring intubation. CT/MRI showed large territory chronic hemorrhagic infarct in the left frontal lobe. Pt extubated 10/14/19 but required reintubation and tracheostomy performed 10/22/19. Currently NPO with G-tube placed 11/10/19. Admitted to CIR on 9/18.  09/22 - MBS with recommendations for NPO 10/01 - decannulated  Noted target d/c date of 10/13.  Weight stable over the last week. Pt remains NPO with trials of nectar-thick liquids with SLP only.  Will continue with current TF regimen and monitor for diet advancement.  Medications reviewed and include: pepcid, SSI q 4 hours, Novolog 2 units q 4 hours, Lantus 5 units BID, dilantin, KCl 20 mEq daily, senokot  Labs reviewed: potassium 3.3, elevated LFTs, hemoglobin 9.4 CBG's: 110-188 x 24 hours  UOP: 1340 ml x 24 hours  Diet Order:   Diet Order            Diet NPO time  specified  Diet effective midnight                 EDUCATION NEEDS:   No education needs have been identified at this time  Skin:  Skin Assessment: Skin Integrity Issues: Incisions: neck (from previous trach)  Last BM:  12/05/19  Height:   Ht Readings from Last 1 Encounters:  11/21/19 '6\' 1"'  (1.854 m)    Weight:   Wt Readings from Last 1 Encounters:  12/07/19 71.6 kg    Ideal Body Weight:  83.6 kg  BMI:  Body mass index is 20.83 kg/m.  Estimated Nutritional Needs:   Kcal:  2000-2200  Protein:  100-115 grams  Fluid:  >/= 2.0 L    KGaynell Face MS, RD, LDN Inpatient Clinical Dietitian Please see AMiON for contact information.

## 2019-12-07 NOTE — Plan of Care (Signed)
Goals downgraded 2/2 slow progress, impaired endurance, balance & strength.   Problem: RH Bed to Chair Transfers Goal: LTG Patient will perform bed/chair transfers w/assist (PT) Description: LTG: Patient will perform bed to chair transfers with assistance (PT). Flowsheets (Taken 12/07/2019 0927) LTG: Pt will perform Bed to Chair Transfers with assistance level: (downgrade 2/2 slow progress) Contact Guard/Touching assist Note: downgrade 2/2 slow progress   Problem: RH Car Transfers Goal: LTG Patient will perform car transfers with assist (PT) Description: LTG: Patient will perform car transfers with assistance (PT). Flowsheets (Taken 12/07/2019 0927) LTG: Pt will perform car transfers with assist:: (downgrade 2/2 slow progress) Minimal Assistance - Patient > 75% Note: downgrade 2/2 slow progress   Problem: RH Ambulation Goal: LTG Patient will ambulate in controlled environment (PT) Description: LTG: Patient will ambulate in a controlled environment, # of feet with assistance (PT). Flowsheets (Taken 12/07/2019 0927) LTG: Pt will ambulate in controlled environ  assist needed:: (downgrade 2/2 slow progress) Contact Guard/Touching assist LTG: Ambulation distance in controlled environment: 75 ft with LRAD Note: downgrade 2/2 slow progress Goal: LTG Patient will ambulate in home environment (PT) Description: LTG: Patient will ambulate in home environment, # of feet with assistance (PT). Flowsheets (Taken 12/07/2019 0927) LTG: Pt will ambulate in home environ  assist needed:: (downgrade 2/2 slow progress) Contact Guard/Touching assist LTG: Ambulation distance in home environment: 50 ft with LRAD Note: downgrade 2/2 slow progress

## 2019-12-07 NOTE — Progress Notes (Signed)
PCCM Interval Progress Note  37 M with large left MCA CVA.  He required tracheostomy given prolonged critical illness.  He is now s/p decannulation 10/1 and has tolerated well.  Continue supportive care and ongoing rehab. No submerging under water until stoma has closed.  If not closed by 10 - 14 days, will need ENT to evaluate for tracheocutaneous fistula closure.   Nothing further to add.  PCCM will sign off.  Please do not hesitate to call us back if we can be of any further assistance.    Rutherford Guys, [place] [facility] 12/07/2019, 10:28 AM

## 2019-12-07 NOTE — Progress Notes (Signed)
[place] PHYSICAL MEDICINE & REHABILITATION PROGRESS NOTE   Subjective/Complaints: Pt up on BSC. OT reports that RUE seems to be bothering him a lot today. Pt agrees that it's hurting more today  ROS: Limited due to cognitive/behavioral    Objective:   No results found. Recent Labs    12/07/19 0624  WBC 5.8  HGB 9.4*  HCT 28.5*  PLT 224   Recent Labs    12/06/19 0920 12/07/19 0624  NA 140 141  K 3.9 3.3*  CL 104 104  CO2 23 27  GLUCOSE 151* 136*  BUN 35* 34*  CREATININE 1.26* 1.24  CALCIUM 8.8* 9.0    Intake/Output Summary (Last 24 hours) at 12/07/2019 1043 Last data filed at 12/07/2019 0700 Gross per 24 hour  Intake 0 ml  Output 1340 ml  Net -1340 ml     Physical Exam: Vital Signs Blood pressure (!) 155/72, pulse 73, temperature 98.3 F (36.8 C), resp. rate 14, height 6\' 1"  (1.854 m), weight 71.6 kg, SpO2 98 %. Constitutional: No distress . Vital signs reviewed. HEENT: EOMI, oral membranes moist Neck: trach stoma dressed. No audible leaks Cardiovascular: RRR without murmur. No JVD    Respiratory/Chest: CTA Bilaterally without wheezes or rales. Normal effort    GI/Abdomen: BS +,  Tender at PEG site.  non-distended Ext: no clubbing, cyanosis, or edema Psych: flat Skin: No evidence of breakdown, no evidence of rash Neurologic: remains aphasic.   motor strength is 5/5 inleft deltoid, bicep, tricep, grip, hip flexor, knee extensors, ankle dorsiflexor and plantar flexor 3- RIght delt bi, tri, grip.  4- to 4/5 Right HF, KE, ADF. RUE flexor tone Musculoskeletal: RUE still sl more tender with PROM. No substantial subluxation appreciated  Assessment/Plan: 1. Functional deficits secondary to Left MCA infarct  which require 15/7 intensity of interdisciplinary therapy in a comprehensive inpatient rehab setting.  Physiatrist is providing close team supervision and 24 hour management of active medical problems listed below.  Physiatrist and rehab team continue to  assess barriers to discharge/monitor patient progress toward functional and medical goals  Care Tool:  Bathing    Body parts bathed by patient: Chest, Abdomen, Face, Right upper leg, Left upper leg, Right arm, Buttocks, Front perineal area   Body parts bathed by helper: Left arm, Right lower leg, Left lower leg     Bathing assist Assist Level: Moderate Assistance - Patient 50 - 74%     Upper Body Dressing/Undressing Upper body dressing   What is the patient wearing?: Pull over shirt    Upper body assist Assist Level: Moderate Assistance - Patient 50 - 74%    Lower Body Dressing/Undressing Lower body dressing      What is the patient wearing?: Pants, Incontinence brief     Lower body assist Assist for lower body dressing: Maximal Assistance - Patient 25 - 49%     Toileting Toileting Toileting Activity did not occur (Clothing management and hygiene only): N/A (no void or bm)  Toileting assist Assist for toileting: Maximal Assistance - Patient 25 - 49%     Transfers Chair/bed transfer  Transfers assist     Chair/bed transfer assist level: Minimal Assistance - Patient > 75%     Locomotion Ambulation   Ambulation assist   Ambulation activity did not occur: Safety/medical concerns  Assist level: 2 helpers Assistive device: Walker-rolling Max distance: 110'   Walk 10 feet activity   Assist  Walk 10 feet activity did not occur: Safety/medical concerns  Assist level:  2 helpers Assistive device: Walker-rolling   Walk 50 feet activity   Assist Walk 50 feet with 2 turns activity did not occur: Safety/medical concerns  Assist level: 2 helpers Assistive device: Walker-rolling    Walk 150 feet activity   Assist Walk 150 feet activity did not occur: Safety/medical concerns  Assist level: 2 helpers Assistive device: Walker-rolling    Walk 10 feet on uneven surface  activity   Assist Walk 10 feet on uneven surfaces activity did not occur:  Safety/medical concerns         Wheelchair     Assist Will patient use wheelchair at discharge?: Yes Type of Wheelchair: Manual Wheelchair activity did not occur: Safety/medical concerns         Wheelchair 50 feet with 2 turns activity    Assist    Wheelchair 50 feet with 2 turns activity did not occur: Safety/medical concerns       Wheelchair 150 feet activity     Assist  Wheelchair 150 feet activity did not occur: Safety/medical concerns       Blood pressure (!) 155/72, pulse 73, temperature 98.3 F (36.8 C), resp. rate 14, height 6\' 1"  (1.854 m), weight 71.6 kg, SpO2 98 %.    Medical Problem List and Plan: 1.Right hemiparesis with severe aphasia/dysphagiasecondary to left MCA distribution infarct as well as history of left MCA infarct -patient may shower   -ELOS/Goals: 21-24d  -Continue CIR---downgrade to 15/7 intensity 2. Antithrombotics: -DVT/anticoagulation:SCDs -antiplatelet therapy: Aspirin 81 mg daily and Plavix 75 mg daily 3. Pain Management:Tylenol as needed  -  gabapentin 100mg  tid for neuropathic pain RUE very beneficial  -support hemiplegic shoulder  -tone mgt  -pain appears to be well controlled. Not sedated from gabapentin  -well controlled 4. Mood:Provide emotional support -antipsychotic agents: Seroquel 50 mg nightly 5. Neuropsych: This patientis notcapable of making decisions on hisown behalf.  -continue sleep chart to measure sleep hygiene 6. Skin/Wound Care:Routine skin checks 7. Fluids/Electrolytes/Nutrition:Routine in and outs with follow-up chemistries 8. Seizure disorder. Dilantin 175 mg 3 times daily, Keppra 1000 mg twice daily, Vimpat 150 mg twice daily. outpt neuro f/u  -10/5 corrected dilantin level 22, spoke with pharmacy and agree with reduction to 200-175-200 dosing.    -f/u level Friday 9. Diabetes mellitus. Hemoglobin A1c 7.2. Lantus insulin 6 units  nightly, NovoLog 2 units every 4 hours. CBG (last 3)  Recent Labs    12/07/19 0011 12/07/19 0409 12/07/19 0809  GLUCAP 188* 182* 110*  10/4: slightly elevate-increase novolog to 3u scheduled  10. Dysphagia. NPO except for trials with SLP. Status post gastrostomy tube 11/10/2019 per interventional radiology. advance per SLP.  -Brother would like to be notified what date he may have MBS next week.  11. Hypertension. Hydralazine 50 mg every 8 hours, Cardura 2 mg daily, Norvasc 10 mg daily.    -10/1 reasonable control  Vitals:   12/06/19 1926 12/07/19 0411  BP: 138/67 (!) 155/72  Pulse: 75 73  Resp:    Temp: 98.7 F (37.1 C) 98.3 F (36.8 C)  SpO2: 100% 98%    12. Acute on chronic respiratory failure. Status post tracheostomy tube. Presently with a #6 Shiley flexible cuffless trach as of 11/02/2019 followed by pulmonary services Dr. 02/06/20  -pt decannulated and tolerating well  -occlusive dressing/pressure to stoma site 13. Hyperlipidemia. Lipitor 14. Urinary retention. ongoing retention    -doxasosin  2mg ---increased to 4mg  9/30   -continue I/o cath prn   -increase urecholine to 50mg  tid  LOS: 16 days A FACE TO FACE EVALUATION WAS PERFORMED  Ranelle Oyster 12/07/2019, 10:43 AM

## 2019-12-07 NOTE — Progress Notes (Signed)
Phenytoin level 15.5 corrected 22. Goal of 15-20 per neurology. Ok to reduce dose to 200mg  BID + 175mg  q1400 per Dr. . We will repeat level on Friday.   Riley Kill, PharmD, BCIDP, AAHIVP, CPP Infectious Disease Pharmacist 12/07/2019 9:14 AM

## 2019-12-07 NOTE — Progress Notes (Signed)
Physical Therapy Session Note  Patient Details  Name: Corey Bennett MRN: [id number] Date of Birth: 1960-09-28  Today's Date: 12/07/2019 PT Individual Time: 1302-1358 PT Individual Time Calculation (min): 56 min   Short Term Goals: Week 3:  PT Short Term Goal 1 (Week 3): STG = LTG due to estimated d/c date.  Skilled Therapeutic Interventions/Progress Updates:      Pt received supine in bed asleep. Easily roused and agreeable to therapy. Supine to sit and stand step transfer to [facility] with minA HHA. WC transport to gym for time management. Sit to stand with RW and close supervision. PT assists pt to strap RUE into hand splint. Pt ambulates x25' with RW and modA. Pt demos multiple LOBs during ambulation and at times appears ataxic and very unsteady. Pt takes extended seated rest breaks and vitals checked with BP 121/67, O2 at 100% and HR 76. Pt performs 1x10 LAQs BLE and requires assistance to achieve full extension with RLE, and appears to be in pain with full extension of R knee. Pt attempts ambulation again and walks similar distance with several LOBs and demonstrating buckling of R knee, as well as maintaining R knee in slight flexion throughout gait cycle. Pt also requiring occasional modA to clear and progress RLE during swing phase. Pt stands in // bars with BUE support, facing mirror for visual feedback, with minA and tactile cues for posture, as well as assistance achieving full RLE extension and increased activation of scapular depressors for improved body mechanics and stability. PT encourages pt to remain in [facility] to improve endurance but pt insistent on going back to bed. MinA for SPT back to bed and minA for sit to supine with PT managing RLE. Left with alarm intact and all needs within reach.  Therapy Documentation Precautions:  Precautions Precautions: Fall Precaution Comments: PEG, decannulated on 12/04/2019 Restrictions Weight Bearing Restrictions: No   Therapy/Group: Individual  Therapy  Beau Fanny, PT, DPT 12/07/2019, 4:21 PM

## 2019-12-07 NOTE — Progress Notes (Signed)
Physical Therapy Note  Patient Details  Name: Corey Bennett MRN: [id number] Date of Birth: 23-Jun-1960 Today's Date: 12/07/2019    Pt's plan of care adjusted to 15/7 after speaking with care team and discussed with MD as pt currently unable to tolerate current therapy schedule with OT, PT, and SLP.     Sandi Mariscal 12/07/2019, 10:37 AM

## 2019-12-07 NOTE — Progress Notes (Signed)
Occupational Therapy Session Note  Patient Details  Name: Corey Bennett MRN: [id number] Date of Birth: 07-04-1960  Today's Date: 12/07/2019 OT Individual Time: 5809-9833 OT Individual Time Calculation (min): 75 min    Short Term Goals: Week 1:  OT Short Term Goal 1 (Week 1): Pt will tolerate therapeutic activity for ~15 min in un supported sitting to improved cardiopulmonary endurance OT Short Term Goal 1 - Progress (Week 1): Progressing toward goal OT Short Term Goal 2 (Week 1): Pt will perform sit to stands inADL tasks with contact guard OT Short Term Goal 2 - Progress (Week 1): Progressing toward goal OT Short Term Goal 3 (Week 1): Pt will don shirt with hemi dressing techniques with min A OT Short Term Goal 3 - Progress (Week 1): Met OT Short Term Goal 4 (Week 1): Pt will perform functional transfer (stand pivot with min A ) OT Short Term Goal 4 - Progress (Week 1): Met Week 2:  OT Short Term Goal 1 (Week 2): Pt will tolerate therapeutic activity for ~15 min in un supported sitting to improved cardiopulmonary endurance OT Short Term Goal 2 (Week 2): Pt will perform sit to stands in ADL tasks with contact guard OT Short Term Goal 3 (Week 2): Pt will perform stand pivot transfers to/from BSC/toilet with CGA OT Short Term Goal 4 (Week 2): Pt will perform LB dress with Min A with sit to stand to don over hips  Skilled Therapeutic Interventions/Progress Updates:    Pt received in bed and nodded yes when asked if he needed to toilet. Moved the [facility] next to his bed and he sat up to EOB with min A and completed stand pivot to [facility] with min -mod A.   Pt sat on BSC for over 30 min while engaging in B/D with max cues and mod A as pt was very distracted by his R arm pain. Pt constantly grimacing and groaning will the slightest arm movements.  Tried supporting his arm on pillow but pt pulled pillow away.  In past sessions, pt has been able to cross his legs to wash feet , doff and don socks with min A  but today he just kept shaking his head no.  Pt unable to void, but he did hold a semistand for 15 sec each 4x to enable me to thoroughly cleanse his bottom as there was still BM residue on him.  He did rise to stand and helped to pull pants over hips.  Transferred to wc with min A. Sat at sink to wash beard and comb hair with max A as pt not attending welll as he was distracted by his R arm pain.  Pt emphatically nodded yes when asked if he would like to lie down for his R arm exercises.    Pt moved to bed with min A, and initially had pt sit EOB to work on scapular retraction but he could not tolerate. he moved into sidelying on L.  PROM to scapula and it continues to glide well.   Worked on gentle massage and MFR to relax muscular around shoulder. Pt tolerated well.   Pt moved to supine.  Worked on gentle elbow PROM as pt has increased tone in biceps with limited extension.  Gentle ROM to hand, pt did not tolerate well.   Had pt hold R hand with L hand supporting his R wrist to do self ROM for elbow. Pt worked on this for 5 minutes with pt to keep going, he  seemed to tolerate the movement the longer he kept moving it.    Further PROM to R wrist with pt not complaining. Donned R splint on hand and explained to pt that his wrist extension is getting limited and he needs to try to wear the splint for a few hours.  Pt agreed. Set up in bed with pillow to support arm.  NT arrived to work with pt.   Therapy Documentation Precautions:  Precautions Precautions: Fall Precaution Comments: PEG, decannulated on 12/04/2019 Restrictions Weight Bearing Restrictions: No   Pain: Pain Assessment Pain Score: 6  Faces Pain Scale: Hurts whole lot Pain Type: Neuropathic pain Pain Location: Arm Pain Orientation: Right Pain Descriptors / Indicators: Aching Pain Onset: On-going Pain Intervention(s): Massage;Rest;Repositioned ADL: ADL Eating: NPO Upper Body Bathing: Moderate assistance Where  Assessed-Upper Body Bathing: Sitting at sink Lower Body Bathing: Moderate assistance Where Assessed-Lower Body Bathing: Sitting at sink Upper Body Dressing: Moderate assistance Where Assessed-Upper Body Dressing: Sitting at sink Lower Body Dressing: Maximal assistance Where Assessed-Lower Body Dressing: Sitting at sink Toileting: Maximal assistance Where Assessed-Toileting: Bedside Commode Toilet Transfer: Minimal assistance Toilet Transfer Method: Stand pivot Toilet Transfer Equipment: Bedside commode   Therapy/Group: Individual Therapy  [place] 12/07/2019, 9:38 AM

## 2019-12-07 NOTE — Progress Notes (Signed)
Physical Therapy Session Note  Patient Details  Name: Corey Bennett MRN: [id number] Date of Birth: 1960-04-07  Today's Date: 12/07/2019 PT Individual Time: 1515-1540 PT Individual Time Calculation (min): 25 min   Short Term Goals: Week 3:  PT Short Term Goal 1 (Week 3): STG = LTG due to estimated d/c date.  Skilled Therapeutic Interventions/Progress Updates:  Pt received asleep in bed requiring extra time, stimulation & encouragement to awaken then pt reluctantly agreeable to tx. Supine>sit with hospital bed features & mod assist with pt pulling on PT arm vs bed rail and requiring assistance to transfer RLE to EOB & upright trunk. Supervision for static sitting balance EOB. Sit<>stand with min assist with PT assisting R hand placement on orthosis. Gait x 40 ft with RW & min assist with assist 50% of the time to advance RLE 2/2 decreased foot clearance & strength & NMR. Pt continues to be unable to fully extend R knee in stance phase & requires up to mod assist for balance during turns. Once sitting on EOB pt appears & reports significant pain in RLE/RUE & requests to rest. Min assist for sit>supine for RLE & assisted pt with placing RUE on pillows, elevated HOB. Pt held therapist's hand & reports thank you with PT providing encouragement & cuing for deep breathing. Nurse made aware of pt's pain but reports pt cannot receive any additional pain meds at this time but will give him something for anxiety. PT left pt in bed with alarm set, call bell in reach, & more calm. During session PT did assist pt with use of yaunker for secretions after cough.  Therapy Documentation Precautions:  Precautions Precautions: Fall Precaution Comments: PEG, decannulated on 12/04/2019 Restrictions Weight Bearing Restrictions: No   General: PT Amount of Missed Time (min): 20 Minutes PT Missed Treatment Reason: Pain   Therapy/Group: Individual Therapy  Sandi Mariscal 12/07/2019, 3:48 PM

## 2019-12-07 NOTE — Progress Notes (Signed)
Speech Language Pathology Daily Session Note  Patient Details  Name: Corey Bennett MRN: [id number] Date of Birth: April 11, 1960  Today's Date: 12/07/2019 SLP Individual Time: 1030-1055 SLP Individual Time Calculation (min): 25 min  Short Term Goals: Week 3: SLP Short Term Goal 1 (Week 3): Patient will participate in repeat MBS to reassess swallow function SLP Short Term Goal 2 (Week 3): Patient will point to object photos or pictures in field of 4-6 with 85% accuracy and minA SLP Short Term Goal 3 (Week 3): Patient will tolerate trials of nectar thick and thin liquids with minimal overt s/s aspiration/penetration SLP Short Term Goal 4 (Week 3): Patient will utilize communication board of 6-8 printed words/pictures to comment and request during functional tasks. SLP Short Term Goal 5 (Week 3): Patient will imitate at 4-5 word phrase level with modA and 70% accuracy  Skilled Therapeutic Interventions:  Skilled treatment session focused on speech and communication goals. SLP facilitated session by providing patient with a choice from a field of 2 to maximize expression of wants/needs at the word level. Patient consumed trials of nectar-thick liquids via tsp without overt s/s of aspiration. Patient initially required total A for use of a chin tuck. SLP showed the patient his most recent MBS and the difference in his swallowing function when utilizing a chin tuck. Patient verbalized understanding and then utilized the chin tuck with supervision verbal cues. Recommend ongoing trials with SLP. At one point, patient attempted to communicate spontaneously at the word level but unintelligible . Patient left upright in bed with alarm on and all needs within reach. Continue with current plan of care.      Pain Pain Assessment Pain Score: 6  Faces Pain Scale: Hurts whole lot Pain Type: Neuropathic pain Pain Location: Arm Pain Orientation: Right Pain Descriptors / Indicators: Aching Pain Onset: On-going Pain  Intervention(s): Massage;Rest;Repositioned  Therapy/Group: Individual Therapy  Jaidy Cottam 12/07/2019, 12:45 PM

## 2019-12-07 NOTE — Progress Notes (Signed)
Physical Therapy Weekly Progress Note  Patient Details  Name: Corey Bennett MRN: [id number] Date of Birth: 1960-05-16  Beginning of progress report period: November 30, 2019 End of progress report period: December 07, 2019  Today's Date: 12/07/2019   Patient has met 0 of 3 short term goals.  Pt is making slow progress towards LTG's. Pt currently can ambulate with RW & min assist but continues to demonstrate impaired strength & neuromuscular control of RUE/RLE, as well as impaired endurance, balance, & awareness. Pt also continues to miss treatment time 2/2 fatigue & decreased willingness to participate. PT plans to discuss with team potential to change pt to 15/7 to increase participation in therapies. Pt would continue to benefit from continued skilled PT treatment to address the deficits noted above/below to increase independence prior to d/c.   Patient continues to demonstrate the following deficits muscle weakness, decreased cardiorespiratoy endurance, decreased coordination, decreased visual perceptual skills, decreased attention to right, decreased initiation, decreased attention, decreased awareness, decreased problem solving, decreased safety awareness, decreased memory and delayed processing, and decreased standing balance, decreased postural control, decreased balance strategies and R hemiparesis and therefore will continue to benefit from skilled PT intervention to increase functional independence with mobility.  Patient is slowly progressing towards LTGs..  Plan of care revisions: goals downgraded to CGA gait with LRAD.  PT Short Term Goals Week 2:  PT Short Term Goal 1 (Week 2): Pt will consistently complete bed mobility with CGA. PT Short Term Goal 1 - Progress (Week 2): Progressing toward goal PT Short Term Goal 2 (Week 2): Pt will complete transfers with LRAD & CGA. PT Short Term Goal 2 - Progress (Week 2): Progressing toward goal PT Short Term Goal 3 (Week 2): Pt will ambulate 50  ft with LRAD & CGA. PT Short Term Goal 3 - Progress (Week 2): Progressing toward goal Week 3:  PT Short Term Goal 1 (Week 3): STG = LTG due to estimated d/c date.   Therapy Documentation Precautions:  Precautions Precautions: Fall Precaution Comments: PEG, decannulated on 12/04/2019 Restrictions Weight Bearing Restrictions: No   Therapy/Group: Individual Therapy  Waunita Schooner 12/07/2019, 9:29 AM

## 2019-12-08 ENCOUNTER — Encounter: Payer: BC Managed Care – PPO | Admitting: Occupational Therapy

## 2019-12-08 ENCOUNTER — Inpatient Hospital Stay (HOSPITAL_COMMUNITY): Payer: BC Managed Care – PPO | Admitting: Speech Pathology

## 2019-12-08 ENCOUNTER — Inpatient Hospital Stay (HOSPITAL_COMMUNITY): Payer: BC Managed Care – PPO | Admitting: Occupational Therapy

## 2019-12-08 ENCOUNTER — Inpatient Hospital Stay (HOSPITAL_COMMUNITY): Payer: BC Managed Care – PPO | Admitting: Physical Therapy

## 2019-12-08 LAB — GLUCOSE, CAPILLARY
Glucose-Capillary: 101 mg/dL — ABNORMAL HIGH (ref 70–99)
Glucose-Capillary: 147 mg/dL — ABNORMAL HIGH (ref 70–99)
Glucose-Capillary: 171 mg/dL — ABNORMAL HIGH (ref 70–99)
Glucose-Capillary: 200 mg/dL — ABNORMAL HIGH (ref 70–99)
Glucose-Capillary: 220 mg/dL — ABNORMAL HIGH (ref 70–99)
Glucose-Capillary: 47 mg/dL — ABNORMAL LOW (ref 70–99)
Glucose-Capillary: 73 mg/dL (ref 70–99)

## 2019-12-08 MED ORDER — OSMOLITE 1.5 CAL PO LIQD
355.0000 mL | Freq: Three times a day (TID) | ORAL | Status: DC
  Administered 2019-12-08: 355 mL

## 2019-12-08 MED ORDER — ONDANSETRON HCL 4 MG/2ML IJ SOLN
4.0000 mg | Freq: Four times a day (QID) | INTRAMUSCULAR | Status: DC | PRN
  Administered 2019-12-08 – 2019-12-09 (×2): 4 mg via INTRAMUSCULAR
  Filled 2019-12-08 (×3): qty 2

## 2019-12-08 NOTE — Progress Notes (Signed)
[place] PHYSICAL MEDICINE & REHABILITATION PROGRESS NOTE   Subjective/Complaints: Right arm feeling better today. Slept last night.   ROS: Patient denies fever, rash, sore throat, blurred vision, nausea, vomiting, diarrhea, cough, shortness of breath or chest pain, joint or back pain, headache, or mood change.    Objective:   No results found. Recent Labs    12/07/19 0624  WBC 5.8  HGB 9.4*  HCT 28.5*  PLT 224   Recent Labs    12/06/19 0920 12/07/19 0624  NA 140 141  K 3.9 3.3*  CL 104 104  CO2 23 27  GLUCOSE 151* 136*  BUN 35* 34*  CREATININE 1.26* 1.24  CALCIUM 8.8* 9.0    Intake/Output Summary (Last 24 hours) at 12/08/2019 1257 Last data filed at 12/07/2019 2241 Gross per 24 hour  Intake 0 ml  Output 700 ml  Net -700 ml     Physical Exam: Vital Signs Blood pressure 139/60, pulse 75, temperature 97.8 F (36.6 C), resp. rate 17, height 6\' 1"  (1.854 m), weight 71.6 kg, SpO2 99 %. Constitutional: No distress . Vital signs reviewed. HEENT: EOMI, oral membranes moist Neck: supple Cardiovascular: RRR without murmur. No JVD    Respiratory/Chest: CTA Bilaterally without wheezes or rales. Normal effort    GI/Abdomen: BS +, non-tender, non-distended Ext: no clubbing, cyanosis, or edema Psych: flat but cooperative.  Skin: No evidence of breakdown, no evidence of rash Neurologic: remains aphasic.   motor strength is 5/5 inleft deltoid, bicep, tricep, grip, hip flexor, knee extensors, ankle dorsiflexor and plantar flexor 3- RIght delt bi, tri, grip.  4- to 4/5 Right HF, KE, ADF. RUE flexor tone Musculoskeletal: RUE still sl more tender with PROM. Minimal subluxation appreciated  Assessment/Plan: 1. Functional deficits secondary to Left MCA infarct  which require 15/7 intensity of interdisciplinary therapy in a comprehensive inpatient rehab setting.  Physiatrist is providing close team supervision and 24 hour management of active medical problems listed  below.  Physiatrist and rehab team continue to assess barriers to discharge/monitor patient progress toward functional and medical goals  Care Tool:  Bathing    Body parts bathed by patient: Chest, Abdomen, Face, Right upper leg, Left upper leg, Right arm, Buttocks, Front perineal area   Body parts bathed by helper: Left arm, Right lower leg, Left lower leg     Bathing assist Assist Level: Moderate Assistance - Patient 50 - 74%     Upper Body Dressing/Undressing Upper body dressing   What is the patient wearing?: Pull over shirt    Upper body assist Assist Level: Moderate Assistance - Patient 50 - 74%    Lower Body Dressing/Undressing Lower body dressing      What is the patient wearing?: Pants, Incontinence brief     Lower body assist Assist for lower body dressing: Moderate Assistance - Patient 50 - 74%     Toileting Toileting Toileting Activity did not occur 17/7 and hygiene only): N/A (no void or bm)  Toileting assist Assist for toileting: Maximal Assistance - Patient 25 - 49%     Transfers Chair/bed transfer  Transfers assist     Chair/bed transfer assist level: Contact Guard/Touching assist     Locomotion Ambulation   Ambulation assist   Ambulation activity did not occur: Safety/medical concerns  Assist level: Moderate Assistance - Patient 50 - 74% Assistive device: Walker-rolling Max distance: 25'   Walk 10 feet activity   Assist  Walk 10 feet activity did not occur: Safety/medical concerns  Assist  level: Moderate Assistance - Patient - 50 - 74% Assistive device: Walker-rolling   Walk 50 feet activity   Assist Walk 50 feet with 2 turns activity did not occur: Safety/medical concerns  Assist level: 2 helpers Assistive device: Walker-rolling    Walk 150 feet activity   Assist Walk 150 feet activity did not occur: Safety/medical concerns  Assist level: 2 helpers Assistive device: Walker-rolling    Walk 10 feet  on uneven surface  activity   Assist Walk 10 feet on uneven surfaces activity did not occur: Safety/medical concerns         Wheelchair     Assist Will patient use wheelchair at discharge?: Yes Type of Wheelchair: Manual Wheelchair activity did not occur: Safety/medical concerns         Wheelchair 50 feet with 2 turns activity    Assist    Wheelchair 50 feet with 2 turns activity did not occur: Safety/medical concerns       Wheelchair 150 feet activity     Assist  Wheelchair 150 feet activity did not occur: Safety/medical concerns       Blood pressure 139/60, pulse 75, temperature 97.8 F (36.6 C), resp. rate 17, height 6\' 1"  (1.854 m), weight 71.6 kg, SpO2 99 %.    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  2. Antithrombotics: -DVT/anticoagulation:SCDs -antiplatelet therapy: Aspirin 81 mg daily and Plavix 75 mg daily 3. Pain Management:Tylenol as needed  -  Gabapentin increased to 200mg  tid for neuropathic pain RUE   -support hemiplegic shoulder  -tone mgt  -pain better controlled 10/5 4. Mood:Provide emotional support -antipsychotic agents: Seroquel 50 mg nightly 5. Neuropsych: This patientis notcapable of making decisions on hisown behalf.  -continue sleep chart to measure sleep hygiene 6. Skin/Wound Care:Routine skin checks 7. Fluids/Electrolytes/Nutrition:Routine in and outs with follow-up chemistries 8. Seizure disorder. Dilantin 175 mg 3 times daily, Keppra 1000 mg twice daily, Vimpat 150 mg twice daily. outpt neuro f/u  -10/4 corrected dilantin level 22, spoke with pharmacy and agree with reduction to 200-175-200 dosing.    -f/u level  Friday 9. Diabetes mellitus. Hemoglobin A1c 7.2. Lantus insulin 6 units nightly, NovoLog 2 units every 4 hours. CBG (last 3)  Recent Labs    12/08/19 0419 12/08/19 0814 12/08/19 1159  GLUCAP 73 220* 147*  10/4: slightly elevate-increase novolog to 3u scheduled  10. Dysphagia. NPO except for trials with SLP. Status post gastrostomy tube 11/10/2019 per interventional radiology. advance per SLP.  -Brother would like to be notified what date he may have MBS this week.  11. Hypertension. Hydralazine 50 mg every 8 hours, Cardura 2 mg daily, Norvasc 10 mg daily.    -10/1 reasonable control  Vitals:   12/08/19 0538 12/08/19 0759  BP: (!) 148/71 139/60  Pulse: 76 75  Resp: 18 17  Temp: 98.5 F (36.9 C) 97.8 F (36.6 C)  SpO2: 100% 99%    12. Acute on chronic respiratory failure. Status post tracheostomy tube. Presently with a #6 Shiley flexible cuffless trach as of 11/02/2019 followed by pulmonary services Dr. 02/07/20  -pt decannulated and tolerating well  -occlusive dressing/pressure to stoma site 13. Hyperlipidemia. Lipitor 14. Urinary retention. ongoing large volume retention    -doxasosin  2mg ---increased to 4mg  9/30   -continue I/o cath prn to keep  volumes less than 500cc   -increased urecholine to 50mg  tid 10/4      LOS: 17 days A FACE TO FACE EVALUATION WAS PERFORMED  12/08/2019, 12:57 PM

## 2019-12-08 NOTE — Progress Notes (Signed)
Physical Therapy Session Note  Patient Details  Name: Corey Bennett MRN: [id number] Date of Birth: Jul 15, 1960  Today's Date: 12/08/2019 PT Individual Time: 6962-9528 PT Individual Time Calculation (min): 26 min   Short Term Goals: Week 3:  PT Short Term Goal 1 (Week 3): STG = LTG due to estimated d/c date.  Skilled Therapeutic Interventions/Progress Updates:  Pt received asleep in bed but easily awakened & agreeable to tx. Pt agreeable to OOB activity & PT dons BLE ted hose total assist. Supine>sit EOB with hospital bed features, extra time & supervision. PT assists with threading pants & pulling them over hips in standing. PT also dons tennis shoes total assist. Sit<>stand with CGA, short distance gait to RW with R hand orthosis with min assist with pt demonstrating impaired RLE dorsiflexion and absent heel strike with decreased foot clearance. Obtained RLE PLS AFO but once donned pt with wide eyes & heavy breathing, pointing for unknown object & with PT providing encouragement & cuing for relaxation pt becomes more calm. Doffed AFO but notified OT of potential for trying it later today. Pt left in w/c with chair alarm donned, pancake call bell in lap.  Therapy Documentation Precautions:  Precautions Precautions: Fall Precaution Comments: PEG, decannulated on 12/04/2019 Restrictions Weight Bearing Restrictions: No    Therapy/Group: Individual Therapy  Sandi Mariscal 12/08/2019, 10:39 AM

## 2019-12-08 NOTE — Progress Notes (Signed)
Occupational Therapy Weekly Progress Note  Patient Details  Name: Corey Bennett MRN: [id number] Date of Birth: December 18, 1960  Beginning of progress report period: December 01, 2019 End of progress report period: December 08, 2019  Today's Date: 12/08/2019 OT Individual Time: 4010-2725 OT Individual Time Calculation (min): 60 min    Patient has met 4 of 4 short term goals.  Despite frequent c/o neuropathic pain (which he did not c/o today!), pt has been able to progress with his ability to tolerate sitting EOB during activity, complete sit to stands and stand pivots with CGA, and make some progress with his self care.  Due to apraxia, arm pain, and limited frustration tolerance he continues to need mod A with UB dressing and LB dressing but he can now put his L leg in pants and pullover his hips in standing with min A.  Limited communication does make therapy sessions challenging.    Patient continues to demonstrate the following deficits: muscle weakness and muscle joint tightness, decreased cardiorespiratoy endurance, abnormal tone, unbalanced muscle activation, decreased coordination and decreased motor planning, decreased attention to right, decreased awareness, decreased problem solving, decreased safety awareness, decreased memory and delayed processing and decreased sitting balance, decreased standing balance, decreased postural control, hemiplegia and difficulty maintaining precautions and therefore will continue to benefit from skilled OT intervention to enhance overall performance with BADL.  Patient progressing toward long term goals..  Continue plan of care.  OT Short Term Goals Week 1:  OT Short Term Goal 1 (Week 1): Pt will tolerate therapeutic activity for ~15 min in un supported sitting to improved cardiopulmonary endurance OT Short Term Goal 1 - Progress (Week 1): Progressing toward goal OT Short Term Goal 2 (Week 1): Pt will perform sit to stands inADL tasks with contact guard OT  Short Term Goal 2 - Progress (Week 1): Progressing toward goal OT Short Term Goal 3 (Week 1): Pt will don shirt with hemi dressing techniques with min A OT Short Term Goal 3 - Progress (Week 1): Met OT Short Term Goal 4 (Week 1): Pt will perform functional transfer (stand pivot with min A ) OT Short Term Goal 4 - Progress (Week 1): Met Week 2:  OT Short Term Goal 1 (Week 2): Pt will tolerate therapeutic activity for ~15 min in un supported sitting to improved cardiopulmonary endurance OT Short Term Goal 1 - Progress (Week 2): Met OT Short Term Goal 2 (Week 2): Pt will perform sit to stands in ADL tasks with contact guard OT Short Term Goal 2 - Progress (Week 2): Met OT Short Term Goal 3 (Week 2): Pt will perform stand pivot transfers to/from BSC/toilet with CGA OT Short Term Goal 3 - Progress (Week 2): Met OT Short Term Goal 4 (Week 2): Pt will perform LB dress with Min A with sit to stand to don over hips OT Short Term Goal 4 - Progress (Week 2): Met Week 3:  OT Short Term Goal 1 (Week 3): STGs = LTGs  Skilled Therapeutic Interventions/Progress Updates:    Pt received in wc with same clothing on as yesterday but stated he did not need to bathe or change clothing.  Demonstrated a Give Mohr sling to pt and had him try on one (only had a small sample and pt would need a large),  Donned pt's shoes with the AFO on R foot.  Pt practiced several sit to stands with CGA with sling and no c/o R arm pain. In standing he keeps R knee  flexed and when cued to extend his R knee with tactile cues, he kept stepping L leg in closer to R foot which made him off balance and then needed min A to correct his balance.   Several nursing students available to assist me with this session.  Sling doffed, pt stood to RW with CGA and practiced walking down hallway as nursing students helped with pushing along the feeding pole.  Pt was able to ambulate about 25 feet with mod cues to keep gaze forward (as he was looking  directly down to the ground) and to lift his R foot to fully advance foot.  Overall mod A.  Pt then had a urinary incontinence episode and urine leaked through brief,all over pants, and his w/c seat was wet,  So he may have urinated prior to walking.   Returned to room, to have all clothing, teds doffed as every thing was wet.  Pt worked on numerous sit to stands at the sink and self cleansed front area as therapist assisted with his back side.  Changed all clothing with mod A overall.  Encouraged pt to wash his face but he began to look frustrated and had a look of anxiety on his face.  Cued pt to relax and this was a task he can easily accomplish.   Pt calmed down and completed CGA transfer to bed and then layed down.  Nursing students assisted getting pt settled in bed as therapist cleaned w/c.   Pt in bed with pillow supporting arm and all needs met.   Therapy Documentation Precautions: fall   Vital Signs:     Pain: no c/o pain today    ADL: ADL Eating: NPO Grooming: Minimal assistance Where Assessed-Grooming: Sitting at sink Upper Body Bathing: Moderate assistance Where Assessed-Upper Body Bathing: Sitting at sink Lower Body Bathing: Moderate assistance Where Assessed-Lower Body Bathing: Sitting at sink Upper Body Dressing: Moderate assistance Where Assessed-Upper Body Dressing: Sitting at sink Lower Body Dressing: Maximal assistance Where Assessed-Lower Body Dressing: Sitting at sink Toileting: Maximal assistance Where Assessed-Toileting: Bedside Commode Toilet Transfer: Moderate assistance Toilet Transfer Method: Stand pivot Toilet Transfer Equipment: Bedside commode  Therapy/Group: Individual Therapy  [place] 12/08/2019, 12:27 PM

## 2019-12-08 NOTE — Progress Notes (Signed)
Nutrition Follow-up  DOCUMENTATION CODES:   Non-severe (moderate) malnutrition in context of chronic illness  INTERVENTION:   Transition to bolus tube feeding regimen: - 355 ml (1.5 cartons) Osmolite 1.5 cal formula QID - ProSource TF 45 ml BID  Bolus tube feeding regimen provides 2210 kcal, 111 grams of protein, and 1082 ml of H2O.   NUTRITION DIAGNOSIS:   Moderate Malnutrition related to chronic illness (stroke with dysphagia) as evidenced by mild fat depletion, moderate fat depletion, mild muscle depletion, moderate muscle depletion.  Ongoing  GOAL:   Patient will meet greater than or equal to 90% of their needs  Met via TF  MONITOR:   Diet advancement, Labs, Weight trends, TF tolerance, Skin, I & O's  REASON FOR ASSESSMENT:   Consult Enteral/tube feeding initiation and management (bolus tube feeds)  ASSESSMENT:   59 year old male with PMH of DM, HTN, HLD, left MCA CVA 06/2019 with residual aphasia and right-sided weakness as well as left CEA 09/2019. Presented 10/10/19 with seizure-like activity requiring intubation. CT/MRI showed large territory chronic hemorrhagic infarct in the left frontal lobe. Pt extubated 10/14/19 but required reintubation and tracheostomy performed 10/22/19. Currently NPO with G-tube placed 11/10/19. Admitted to CIR on 9/18.  09/22 - MBS with recommendations for NPO 10/01 - decannulated  Noted target d/c date of 10/13.  Consult received to transition pt to bolus tube feeding regimen. Discussed plan with RN. Pt tolerating current TF regimen without documented issues.  Current weight: 71.6 kg Admission weight: 74.2 kg  Medications reviewed and include: pepcid, SSI q 4 hours, Novolog 3 units q 4 hours, Lantus 5 units BID, dilantin, KCl 20 mEq daily, senokot  Labs reviewed: potassium 3.3, hemoglobin 9.4 CBG's: 73-220 x 24 hours  UOP: 1550 ml x 24 hours  Diet Order:   Diet Order            Diet NPO time specified  Diet effective midnight                  EDUCATION NEEDS:   No education needs have been identified at this time  Skin:  Skin Assessment: Skin Integrity Issues: Incisions: neck (from previous trach)  Last BM:  12/05/19  Height:   Ht Readings from Last 1 Encounters:  11/21/19 '6\' 1"'  (1.854 m)    Weight:   Wt Readings from Last 1 Encounters:  12/07/19 71.6 kg    Ideal Body Weight:  83.6 kg  BMI:  Body mass index is 20.83 kg/m.  Estimated Nutritional Needs:   Kcal:  2000-2200  Protein:  100-115 grams  Fluid:  >/= 2.0 L    Gaynell Face, MS, RD, LDN Inpatient Clinical Dietitian Please see [facility] for contact information.

## 2019-12-08 NOTE — Progress Notes (Signed)
Patient now asleep, no complications noted at this time. Jay Schlichter, LPN

## 2019-12-08 NOTE — Progress Notes (Signed)
Patient ID: Corey Bennett, male   DOB: 14-Jul-1960, 59 y.o.   MRN: [id number]  SW received message from Pam/[employer] [place] [phone]) reporting pt was authorized for lateral transfer to [facility] with pre-cert in system for admit on 10/6, and family responsible for transportation to location. Reports able to adjust date pending when pt is able to have transportation arranged.   SW left message for pt son [name] ([phone]) to inform on above and informed there will be follow-up to inform on when he is medically ready to d/c, and inquired about transportation.   SW returned phone call to pt mother [name] [phone]) to inform on receiving message about sending referral to [facility] [phone]), and inform on above. She reports SW will need to speak with [name] with regard to transportation since he will be arranging.   SWleft message forLacey/RN Coordinator with [facility] (p:[phone]/f:[phone])toinform on above.  SW to follow-up once there is more information about transition.   *Pt will d/c on 10/12 to [facility] via [facility] ([phone]/contact is Lowella Grip) with pick up at 7pm. Transportation provider can manage bolus TF. Oletha Cruel with [employer] to help coordinate bolus TF enough for transport on day of discharge. D/c packet should include AVS, d/c summary, and facesheet. SW left message for Pam/[employer] to inform on pt date of d/c due to transportation. Please follow-up with her if needed.   Cecile Sheerer, MSW, LCSWA Office: [phone] Cell: [phone] Fax: [phone]

## 2019-12-08 NOTE — Progress Notes (Signed)
Speech Language Pathology Daily Session Note  Patient Details  Name: Corey Bennett MRN: [id number] Date of Birth: [birth year]  Today's Date: 12/08/2019 SLP Individual Time: 0730-0800 SLP Individual Time Calculation (min): 30 min  Short Term Goals: Week 3: SLP Short Term Goal 1 (Week 3): Patient will participate in repeat MBS to reassess swallow function SLP Short Term Goal 2 (Week 3): Patient will point to object photos or pictures in field of 4-6 with 85% accuracy and minA SLP Short Term Goal 3 (Week 3): Patient will tolerate trials of nectar thick and thin liquids with minimal overt s/s aspiration/penetration SLP Short Term Goal 4 (Week 3): Patient will utilize communication board of 6-8 printed words/pictures to comment and request during functional tasks. SLP Short Term Goal 5 (Week 3): Patient will imitate at 4-5 word phrase level with modA and 70% accuracy  Skilled Therapeutic Interventions: Skilled treatment session focused on communication goals. SLP facilitated session by providing overall Max A multimodal cues for use of the EMST device. Patient with severe apraxia and initially tried to use the device as a phone and then starting counting into it. Patient eventually able to utilize it for ~15 repetitions at 10 cm H2O with Max A multimodal cues for accuracy. Patient performed oral care via the suction toothbrush which appeared to create mild upper airway secretions. Patient cued to cough/throat clear to clear without due to apraxia and was perseverative on "cough to clear." Patient then appeared anxious with his eyes wide. With questioning and multimodal cues, patient indicated he was dizzy and had a headache. RN aware. Vitals taken. Patient left upright in bed with alarm on and all needs within reach. Continue with current plan of care.       Pain No/Denies Pain   Therapy/Group: Individual Therapy  Burton Gahan 12/08/2019, 10:09 AM

## 2019-12-08 NOTE — Patient Care Conference (Signed)
Inpatient RehabilitationTeam Conference and Plan of Care Update Date: 12/08/2019   Time: 10:55 AM    Patient Name: Corey Bennett      Medical Record Number: [id number]  Date of Birth: [birth year] Sex: Male         Room/Bed: 4W25C/4W25C-01 Payor Info: Payor: BLUE CR[employer] BLUE [employer]IELD / Plan: BCBS COMM PPO / Product Type: *No Product type* /    Admit Date/Time:  11/21/2019  2:14 PM  Primary Diagnosis:  Left middle cerebral artery stroke [facility])  Hospital Problems: Principal Problem:   Left middle cerebral artery stroke [facility]) Active Problems:   Malnutrition of moderate degree   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 Faith Rogue Care Coodinator Present: Kennyth Arnold, RN, BSN, CRRN;Cecile Sheerer, LCSWA Nurse Present: Jesusita Oka, LPN PT Present: Aleda Grana, PT OT Present: Primitivo Gauze, OT SLP Present: Feliberto Gottron, SLP PPS Coordinator present : Edson Snowball, Park Breed, SLP     Current Status/Progress Goal Weekly Team Focus  Bowel/Bladder   pt incontinent of bowel lbm 12/05/19 in and out cath q8 with no void  become cont of b and b, begin voiding on own  assess q shidt and prn, in and out 88 with no void   Swallow/Nutrition/ Hydration   NPO with PEG, trials of nectar-thick liquids via TSP with chin tuck, Mod A  Min A  ongoing trials, EMST, pharyngeal strengthening exercises   ADL's   very similar to last week, not much change.  min -mod stand pivot transfers to [facility], mod A overall with self care, continues to c/o intense RUE pain.  mod hypertone with limited PROM of wrist extensors  Supervision with UB dressing, toilet transfers, sit to stand, standing balance; min A LB dressing, toileting, bathing, shower transfers,  functional mobility, postural control, RUE NMR, PROM, positioning, pt education, ADL training   Mobility             Communication   Mod  A  Min A  use of multimodal communication, verbalizations at the word level   Safety/Cognition/ Behavioral Observations  Min-Mod A  Min A  emergent awareness   Pain   pt complains of pain in lower legs, faces scale level6  decrease pain  assess q shift and prn, gabapentin for pain in legs   Skin   no current skin issues,trach site covered with dressing. peg inserted in abdomen  remain free of skin breakdown  assess q shift and  prn     Discharge Planning:  Plans to d/c to [place]. Pt approved for lateral trasnfer to [facility] in [place]. D/C date pending when pt medically ready and family able to arrange medical transport.   Team Discussion: Bottom a little red, cathing  q 8 hr and emptying [id number] mL. OT reporting pain is a limiting factor. PT reports that patient has plateaued in therapy. SLP reports that patient is very apraxic today.  Patient on target to meet rehab goals: yes, and patient will be transferred to [facility] in [place].  *See Care Plan and progress notes for long and short-term goals.   Revisions to Treatment Plan:  Foley to be placed prior to transfer, patient to be put on bolus tube feeds.  Teaching Needs: Continue family education until transfer.  Current Barriers to Discharge: Wound care, Nutritional means and pain, and neurogenic bladder.  Possible Resolutions to Barriers: Teach wound care, adjust tube feeds, pain management,  adjust bladder medications.     Medical Summary Current Status: trach out, tolerating without issue. still npo. aphasic. pain RUE, retaining urine  Barriers to Discharge: Medical stability   Possible Resolutions to Barriers/Weekly Focus: local wound care, adjusting TF to meet nutritional needs. urecholine, doxasosin for bladder emptying, I/O caths   Continued Need for Acute Rehabilitation Level of Care: The patient requires daily medical management by a physician with specialized training in physical medicine and rehabilitation for the  following reasons: Direction of a multidisciplinary physical rehabilitation program to maximize functional independence : Yes Medical management of patient stability for increased activity during participation in an intensive rehabilitation regime.: Yes Analysis of laboratory values and/or radiology reports with any subsequent need for medication adjustment and/or medical intervention. : Yes   I attest that I was present, lead the team conference, and concur with the assessment and plan of the team.   Tennis Must 12/08/2019, 3:45 PM

## 2019-12-08 NOTE — Progress Notes (Signed)
Occupational Therapy Session Note  Patient Details  Name: Corey Bennett MRN: [id number] Date of Birth: 12/28/1960  Today's Date: 12/08/2019 OT Individual Time: 8889-1694 OT Individual Time Calculation (min): 30 min    Short Term Goals: Week 3:  OT Short Term Goal 1 (Week 3): STGs = LTGs  Skilled Therapeutic Interventions/Progress Updates:    Pt supine in bed, with groans throughout session, appearing in pain with all RUE movement passive and active however when ask pt to point to location of pain, pt unable to follow command to identify location.  Gentle sustained PROM (to pts tolerance ) completed to shoulder (90 degrees and below), elbow, FA, wrist, and hand all planes.  Able to achieve full PROM each joint with exception of shoulder per precautions.  Pt completed supine to sit with mod assist and then pt able to sit EOB with CGA.  Gentle weightbearing through RUE completed with manual support provided by OT at shoulder, elbow, and hand.  Pt appearing in significant pain and attempting to repetitively  remove therapists hands from RUE therefore discontinued.  Pt returned to supine with min assist and repositioned towards [facility] with +2 assist due to pt unable to follow simple commands to sequence bridge technique.  Call bell in reach, bed alarm on.    Therapy Documentation Precautions:  Precautions Precautions: Fall Precaution Comments: PEG, decannulated on 12/04/2019 Restrictions Weight Bearing Restrictions: No   Therapy/Group: Individual Therapy  Amie Critchley 12/08/2019, 4:48 PM

## 2019-12-08 NOTE — Progress Notes (Signed)
New order from dietary to discontinue continuing feed and new order for bolus feed. Instructions to start slow to see how well patient tolerates. Administered medications, 30 mins after patient began to vomit. Unknown of amount, brownish color. Jesusita Oka, PA notified. 4mg  zofran given IM injection. No further complications noted at this time.  , LPN

## 2019-12-09 ENCOUNTER — Inpatient Hospital Stay (HOSPITAL_COMMUNITY): Payer: BC Managed Care – PPO

## 2019-12-09 ENCOUNTER — Inpatient Hospital Stay (HOSPITAL_COMMUNITY): Payer: BC Managed Care – PPO | Admitting: Speech Pathology

## 2019-12-09 ENCOUNTER — Inpatient Hospital Stay (HOSPITAL_COMMUNITY): Payer: BC Managed Care – PPO | Admitting: Occupational Therapy

## 2019-12-09 LAB — CBC
HCT: 27.7 % — ABNORMAL LOW (ref 39.0–52.0)
Hemoglobin: 8.9 g/dL — ABNORMAL LOW (ref 13.0–17.0)
MCH: 30 pg (ref 26.0–34.0)
MCHC: 32.1 g/dL (ref 30.0–36.0)
MCV: 93.3 fL (ref 80.0–100.0)
Platelets: 220 10*3/uL (ref 150–400)
RBC: 2.97 MIL/uL — ABNORMAL LOW (ref 4.22–5.81)
RDW: 13.9 % (ref 11.5–15.5)
WBC: 11.5 10*3/uL — ABNORMAL HIGH (ref 4.0–10.5)
nRBC: 0 % (ref 0.0–0.2)

## 2019-12-09 LAB — COMPREHENSIVE METABOLIC PANEL
ALT: 48 U/L — ABNORMAL HIGH (ref 0–44)
AST: 43 U/L — ABNORMAL HIGH (ref 15–41)
Albumin: 3.2 g/dL — ABNORMAL LOW (ref 3.5–5.0)
Alkaline Phosphatase: 142 U/L — ABNORMAL HIGH (ref 38–126)
Anion gap: 13 (ref 5–15)
BUN: 41 mg/dL — ABNORMAL HIGH (ref 6–20)
CO2: 28 mmol/L (ref 22–32)
Calcium: 9.1 mg/dL (ref 8.9–10.3)
Chloride: 103 mmol/L (ref 98–111)
Creatinine, Ser: 1.75 mg/dL — ABNORMAL HIGH (ref 0.61–1.24)
GFR calc non Af Amer: 42 mL/min — ABNORMAL LOW (ref >60)
Glucose, Bld: 175 mg/dL — ABNORMAL HIGH (ref 70–99)
Potassium: 3.6 mmol/L (ref 3.5–5.1)
Sodium: 144 mmol/L (ref 135–145)
Total Bilirubin: 0.4 mg/dL (ref 0.3–1.2)
Total Protein: 7.4 g/dL (ref 6.5–8.1)

## 2019-12-09 LAB — GLUCOSE, CAPILLARY
Glucose-Capillary: 111 mg/dL — ABNORMAL HIGH (ref 70–99)
Glucose-Capillary: 176 mg/dL — ABNORMAL HIGH (ref 70–99)
Glucose-Capillary: 186 mg/dL — ABNORMAL HIGH (ref 70–99)
Glucose-Capillary: 186 mg/dL — ABNORMAL HIGH (ref 70–99)
Glucose-Capillary: 264 mg/dL — ABNORMAL HIGH (ref 70–99)
Glucose-Capillary: 92 mg/dL (ref 70–99)

## 2019-12-09 IMAGING — DX DG ABDOMEN 1V
1 series · 1 of 1 positions shown · non-contrast
Comparison: [DATE]

CLINICAL DATA: Nausea and vomiting.

EXAM:
ABDOMEN - 1 VIEW

[abdomen]
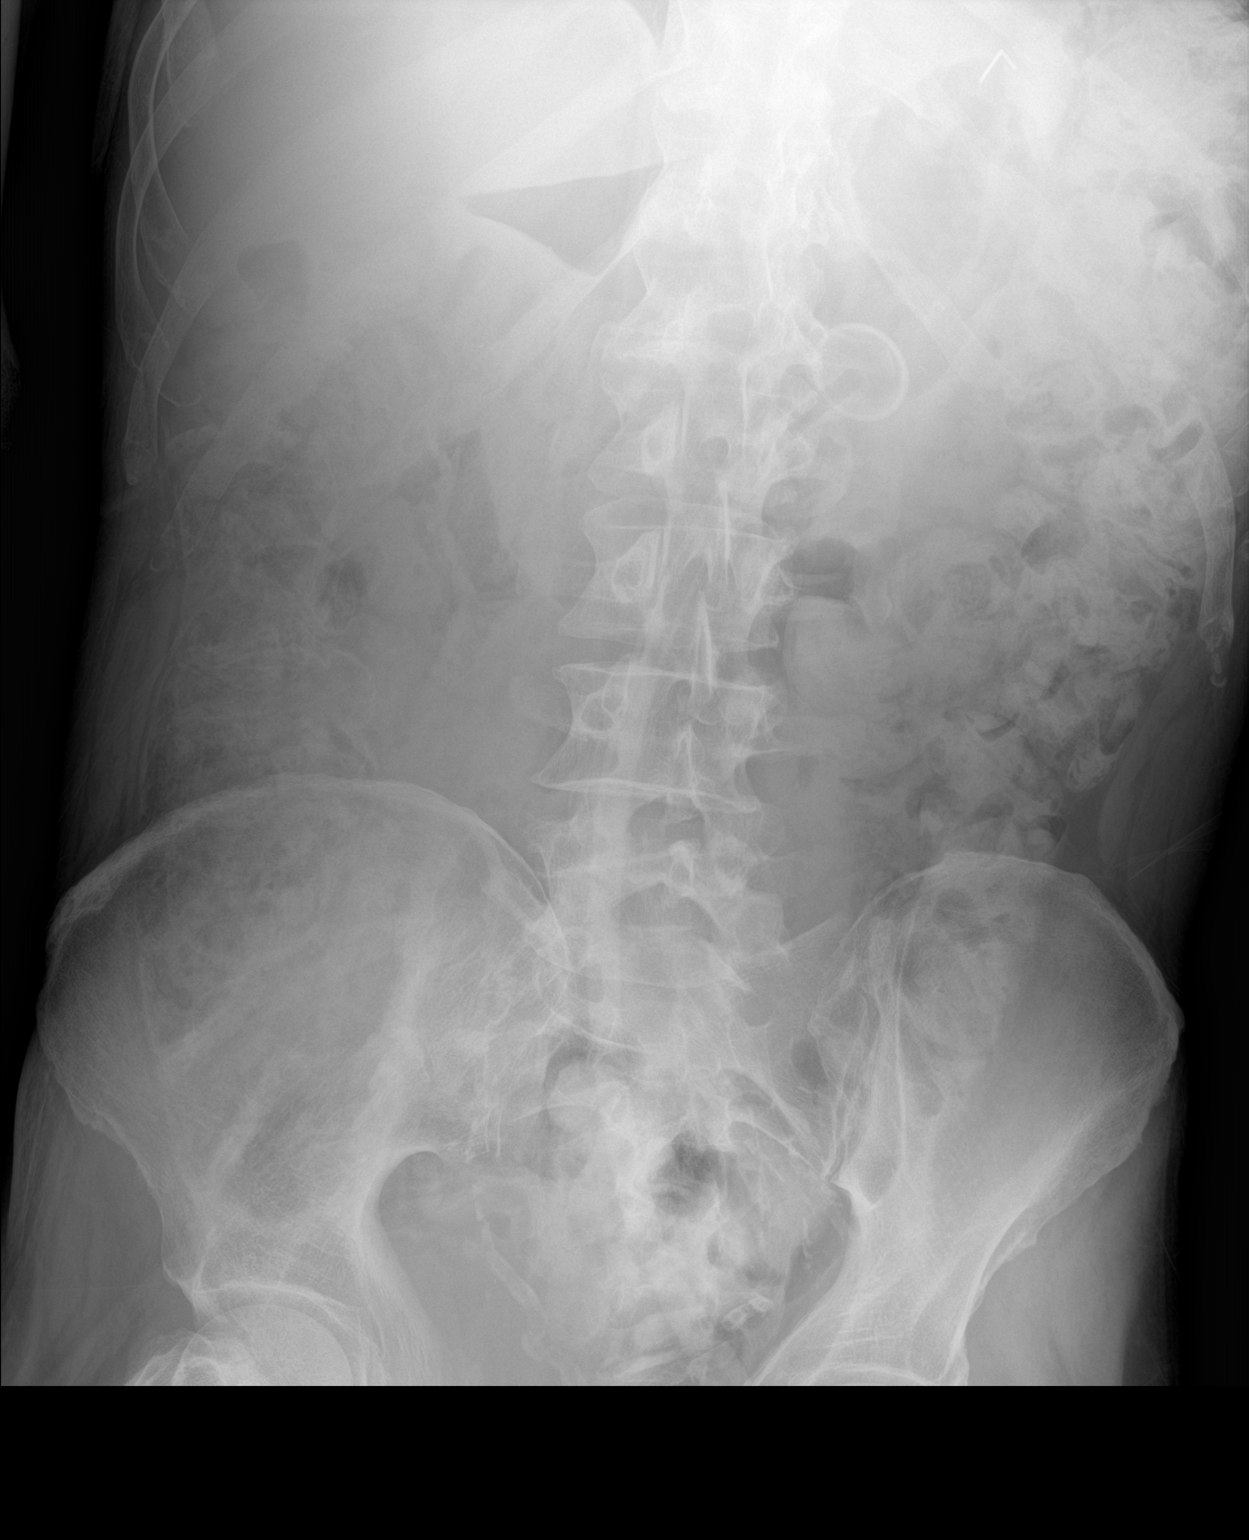

[1 of 1 positions shown; findings below may reference images not displayed]

FINDINGS: There is no evidence for gaseous bowel dilation to suggest
obstruction. Gastrostomy tube again noted. Gas in the medial right
upper quadrant is likely in the distal stomach/duodenum, but free
air under the liver on a supine film could have this appearance.
Prominent stool volume. Visualized bony anatomy unremarkable.
IMPRESSION: 1. Gas in the medial right upper quadrant likely in the distal
stomach/duodenum although free air under the liver on a supine film
could have this appearance. CT scan of the abdomen and pelvis could
be performed to further evaluate as clinically warranted.
2. Prominent left colonic stool volume.

## 2019-12-09 IMAGING — DX DG CHEST 2V
2 series · 2 of 2 positions shown · non-contrast
Comparison: [DATE]

CLINICAL DATA: CVA with concern for potential aspiration

EXAM:
CHEST - 2 VIEW

[chest lat]
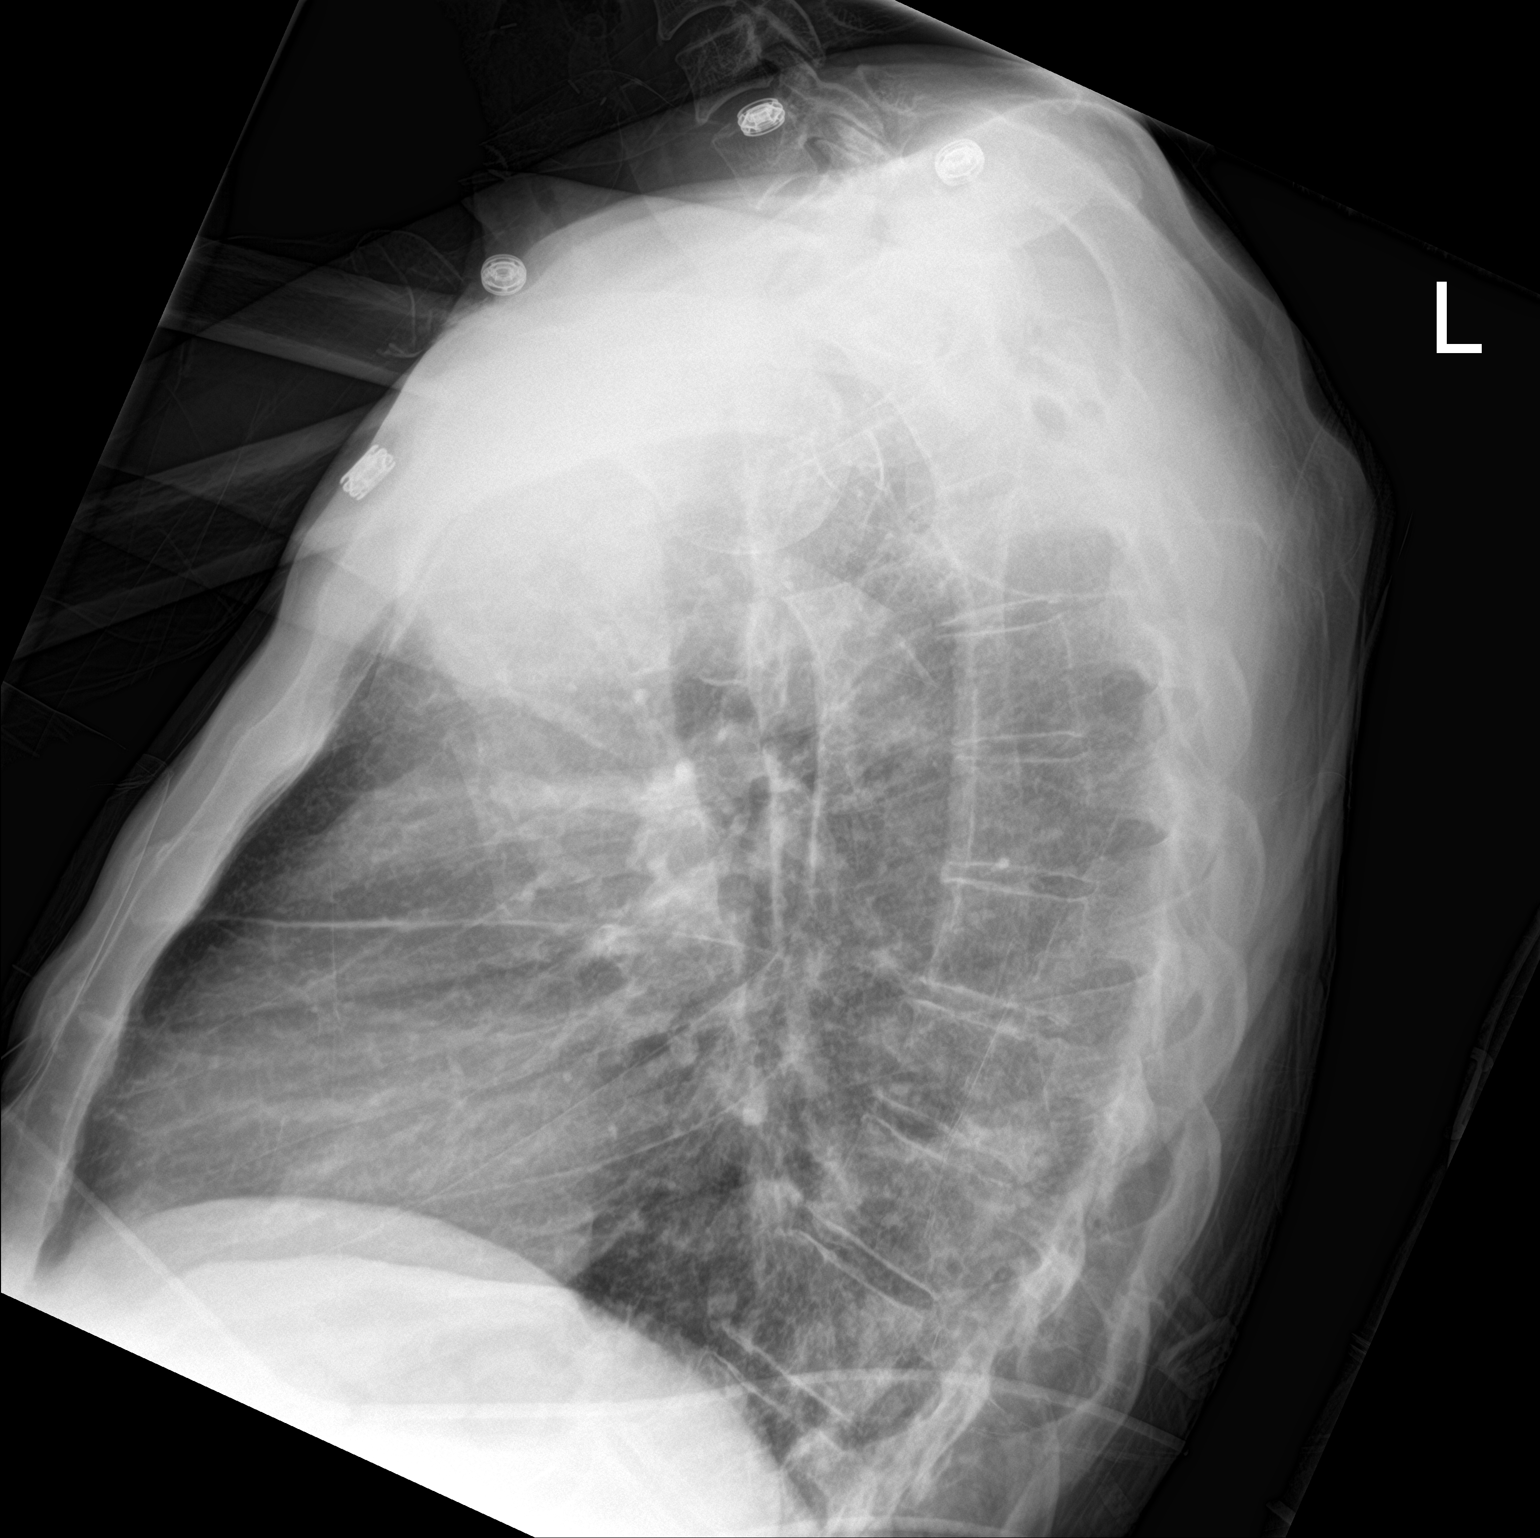

[chest ap]
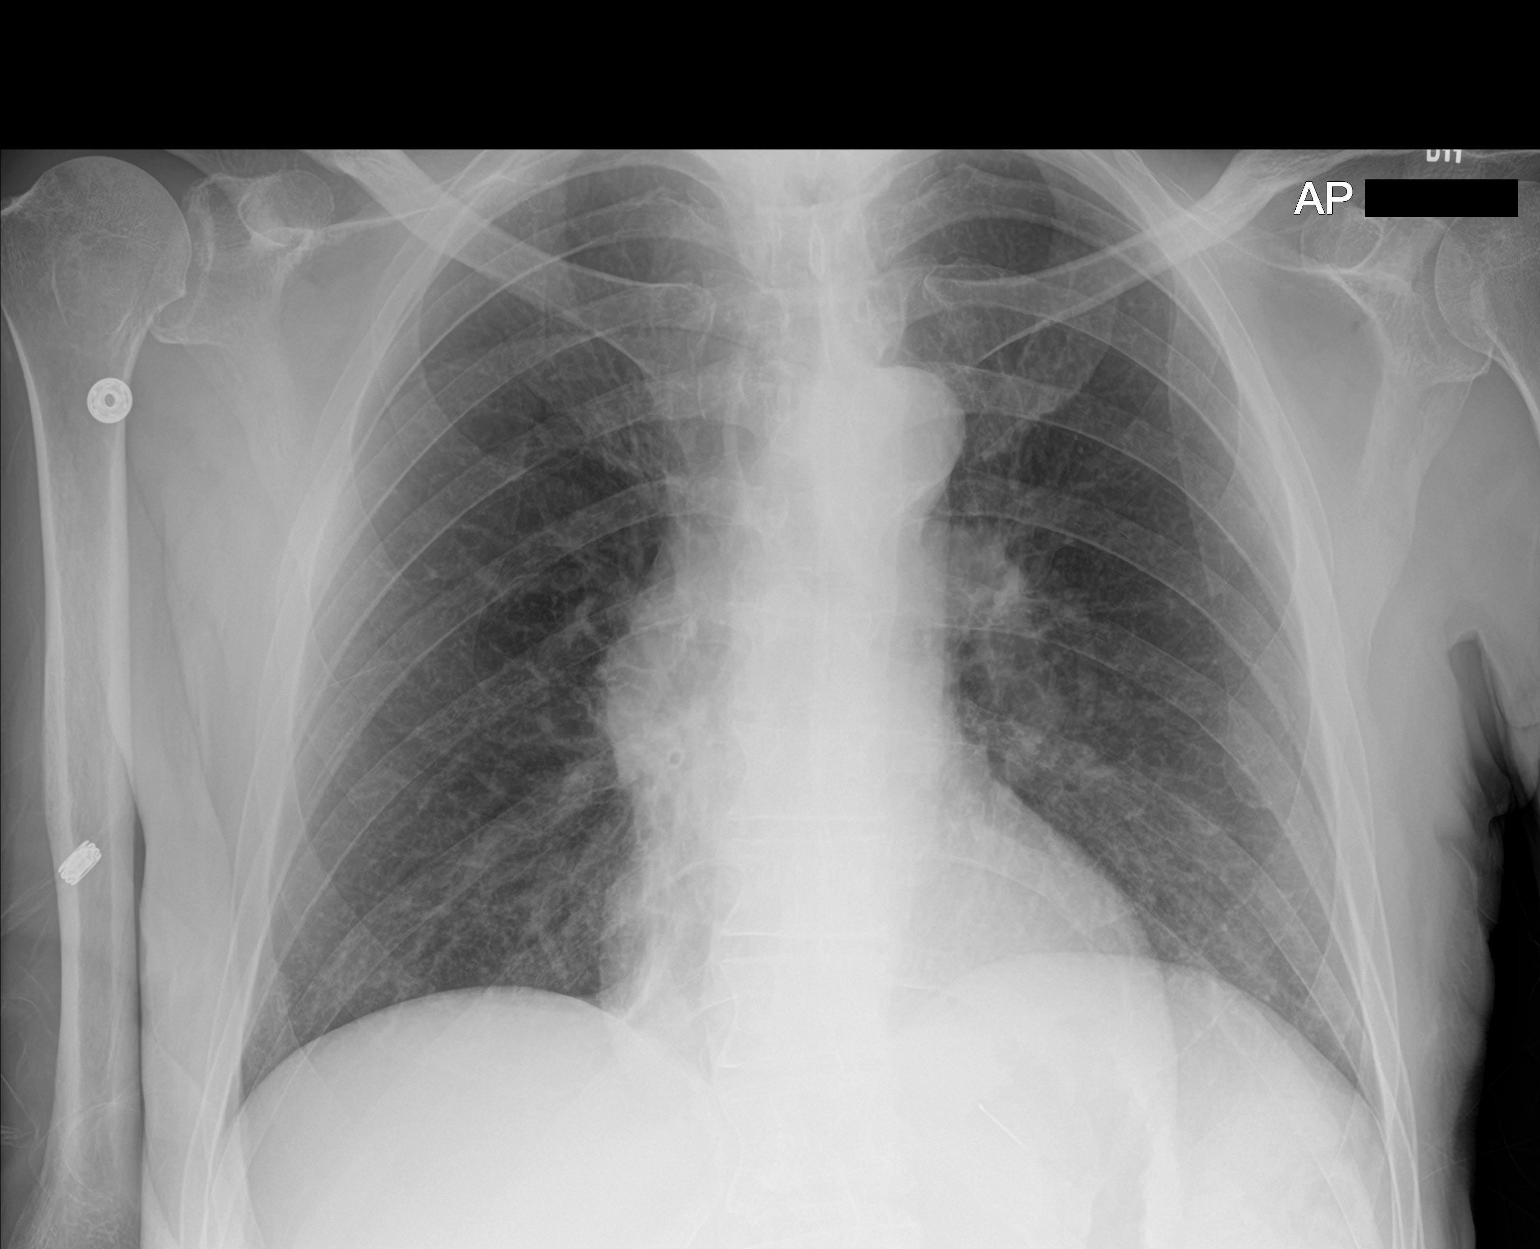

[2 of 2 positions shown; findings below may reference images not displayed]

FINDINGS: Tracheostomy no longer appreciable. Lungs are clear. Heart size and
pulmonary vascularity are normal. No adenopathy. No bone lesions.
IMPRESSION: Lungs clear.  Cardiac silhouette normal.

## 2019-12-09 MED ORDER — OSMOLITE 1.5 CAL PO LIQD
1000.0000 mL | ORAL | Status: DC
  Administered 2019-12-09 (×2): 1000 mL

## 2019-12-09 MED ORDER — CEPHALEXIN 250 MG PO CAPS
250.0000 mg | ORAL_CAPSULE | Freq: Three times a day (TID) | ORAL | Status: DC
  Administered 2019-12-09 – 2019-12-13 (×12): 250 mg via ORAL
  Filled 2019-12-09 (×13): qty 1

## 2019-12-09 MED ORDER — FREE WATER
150.0000 mL | Status: DC
  Administered 2019-12-09 – 2019-12-15 (×35): 150 mL

## 2019-12-09 MED ORDER — ONDANSETRON HCL 4 MG/2ML IJ SOLN
4.0000 mg | Freq: Four times a day (QID) | INTRAMUSCULAR | Status: DC | PRN
  Administered 2019-12-09: 4 mg via INTRAVENOUS

## 2019-12-09 MED ORDER — METOCLOPRAMIDE HCL 5 MG/ML IJ SOLN
5.0000 mg | Freq: Three times a day (TID) | INTRAMUSCULAR | Status: DC
  Administered 2019-12-09 – 2019-12-13 (×12): 5 mg via INTRAVENOUS
  Filled 2019-12-09 (×12): qty 2

## 2019-12-09 NOTE — Progress Notes (Addendum)
SSE given without success.  Only small amt of soft stool returns.  Patient continues with n/v episodes.  Aspirant of PEG tube drainage reveals dark brown fluid with brown flecks.  Received order for PIV to be inserted for potential re-hydration and administration of medications.

## 2019-12-09 NOTE — Progress Notes (Addendum)
   12/09/19 1323  Assess: MEWS Score  Temp 98.9 F (37.2 C)  BP (!) 148/69  Pulse Rate 89  Resp (!) 44  SpO2 100 %  O2 Device Room Air  Assess: MEWS Score  MEWS Temp 0  MEWS Systolic 0  MEWS Pulse 0  MEWS RR 3  MEWS LOC 0  MEWS Score 3  MEWS Score Color Yellow  Assess: if the MEWS score is Yellow or Red  Were vital signs taken at a resting state? Yes  Focused Assessment No change from prior assessment  Early Detection of Sepsis Score *See Row Information* Low  MEWS guidelines implemented *See Row Information* Yes  Treat  MEWS Interventions Other (Comment);Administered prn meds/treatments  Pain Scale PAINAD  Pain Score 9  Pain Intervention(s) Repositioned  Breathing 2  Negative Vocalization 1  Facial Expression 2  Body Language 2  Consolability 2  PAINAD Score 9  Take Vital Signs  Increase Vital Sign Frequency  Yellow: Q 2hr X 2 then Q 4hr X 2, if remains yellow, continue Q 4hrs  Notify: Charge Nurse/RN  Name of Charge Nurse/RN Notified Hassie Bruce, RN  Date Charge Nurse/RN Notified 12/09/19  Time Charge Nurse/RN Notified 1323  Notify: Provider  Provider Name/Title Malissa Hippo, PA  Date Provider Notified 12/09/19  Time Provider Notified 1323  Notification Type Call  Notification Reason Other (Comment)  Response See new orders  Date of Provider Response 12/09/19  Time of Provider Response 1323  Document  Patient Outcome Not stable and remains on department  Progress note created (see row info) Yes   Also spoke with dietary services.  To re-start continuous TF and H2O once nausea / vomiting have subsided.

## 2019-12-09 NOTE — Progress Notes (Signed)
Occupational Therapy Session Note  Patient Details  Name: Corey Bennett MRN: [id number] Date of Birth: 1960-09-20  Today's Date: 12/09/2019 OT Individual Time: 1941-7408 OT Individual Time Calculation (min): 54 min    Short Term Goals: Week 3:  OT Short Term Goal 1 (Week 3): STGs = LTGs  Skilled Therapeutic Interventions/Progress Updates:    Pt greeted at time of session supine in bed resting with HOB elevated, RN present performing med pass stating the pt had not been feeling well today and was having a rough day. Pt initially resistant to OT session but participated with max encouragement. Supine to sit EOB Min A and using therapist UE to simulate bed rail. RN stating pt needed to have BM and present to assist transfer to [facility] after set up. Attempted hand held stand pivot but pt with decreased safety awareness, returned to sitting and SPT with RW with Min A for sit to stand from bed surface and Mod for stand pivot transfer, pt with tendency to try to sit down prematurely. Once on toilet, pt given extended period of time for BM, and when asked if he was done pt nodded "yes" but pt did not void either B/B. SPT back to bed with Min-Mod A with RW, cues for managing RLE. Sit to supine Min A, note that pt is more fatigued today. Therapist provided sensory stimulation to RUE with pt resting in supine with tapping/rubbing prior to gentle PROM for all digits individually and composite, decreased pain levels noted with slow PROM. Pt with facial grimacing during toileting portion of session, unable to communicate location or severity of pain despite trial of using communication board and white board for writing. Pt returned to bed with alarm on, call bell in reach, positioned RUE for support. RN aware of pain.   Therapy Documentation Precautions:  Precautions Precautions: Fall Precaution Comments: PEG, decannulated on 12/04/2019 Restrictions Weight Bearing Restrictions: No     Therapy/Group: Individual  Therapy  [name] Score 12/09/2019, 2:52 PM

## 2019-12-09 NOTE — Progress Notes (Addendum)
Pt vomiting and nauseous. Vomiting small amount of brown thin liquid along with clear liquid. Cleaned pt up he is resting at this time and no longer vomiting. Notified Delle Reining. KUB ordered and to hold bolus feeding until evaluation. No other problems to rpeort at this time.

## 2019-12-09 NOTE — Progress Notes (Signed)
   12/09/19 1143  Assess: MEWS Score  Temp 99.4 F (37.4 C)  BP (!) 132/50  Pulse Rate 89  Resp (!) 32  Level of Consciousness Alert  SpO2 94 %  O2 Device Room Air  Assess: MEWS Score  MEWS Temp 0  MEWS Systolic 0  MEWS Pulse 0  MEWS RR 2  MEWS LOC 0  MEWS Score 2  MEWS Score Color Yellow  Assess: if the MEWS score is Yellow or Red  Were vital signs taken at a resting state? Yes  Focused Assessment Change from prior assessment (see assessment flowsheet)  Early Detection of Sepsis Score *See Row Information* Low  MEWS guidelines implemented *See Row Information* Yes  Treat  MEWS Interventions Other (Comment)  Pain Scale PAINAD  Pain Intervention(s) Repositioned  Breathing 2  Negative Vocalization 1  Facial Expression 2  Body Language 2  Consolability 2  PAINAD Score 9  Take Vital Signs  Increase Vital Sign Frequency  Yellow: Q 2hr X 2 then Q 4hr X 2, if remains yellow, continue Q 4hrs  Escalate  MEWS: Escalate Yellow: discuss with charge nurse/RN and consider discussing with provider and RRT  Notify: Charge Nurse/RN  Name of Charge Nurse/RN Notified Cyril Loosen, RN  Date Charge Nurse/RN Notified 12/09/19  Time Charge Nurse/RN Notified 1145  Notify: Provider  Provider Name/Title Lyndon Code, PA (PA paged)  Date Provider Notified 12/09/19  Time Provider Notified 1150  Notification Type Call  Notification Reason Change in status  Response See new orders  Date of Provider Response 12/09/19  Time of Provider Response 1150  Document  Progress note created (see row info) Yes

## 2019-12-09 NOTE — Progress Notes (Signed)
Speech Language Pathology Daily Session Note  Patient Details  Name: Corey Bennett MRN: [id number] Date of Birth: 13-Feb-1961  Today's Date: 12/09/2019 SLP Individual Time: 8466-5993 SLP Individual Time Calculation (min): 45 min  Short Term Goals: Week 3: SLP Short Term Goal 1 (Week 3): Patient will participate in repeat MBS to reassess swallow function SLP Short Term Goal 2 (Week 3): Patient will point to object photos or pictures in field of 4-6 with 85% accuracy and minA SLP Short Term Goal 3 (Week 3): Patient will tolerate trials of nectar thick and thin liquids with minimal overt s/s aspiration/penetration SLP Short Term Goal 4 (Week 3): Patient will utilize communication board of 6-8 printed words/pictures to comment and request during functional tasks. SLP Short Term Goal 5 (Week 3): Patient will imitate at 4-5 word phrase level with modA and 70% accuracy  Skilled Therapeutic Interventions: Skilled treatment session focused on communication goals. Upon arrival, patient was asleep while upright in bed. Mod A verbal and tactile cues were required for initiation of wiping face with mild apraxia noted during attempt. Patient began to look anxious and pointed to "nauseous" on his communication board. Patient began to vomit a large amount of brown emesis with consistent coughing. SLP provided suction of the oral cavity and verbal cues to slow his breathing down. Patient with audible breathing throughout the session and appeared restless in bed due to discomfort. Patient unable to utilize verbal and written cues effectively today to express his wants/needs, suspect due to distracted by pain and anxiety. Patient eventually indicated he needed to use the commode by utilizing his communicate board. Patient transferred to the commode but had no results. Patient transferred back to bed and left upright with all needs within reach. Continue with current plan of care.      Pain Patient appeared to be in  pain, suspect a stomach ache due to body positioning and grimacing but unable to confirm. RN made aware.   Therapy/Group: Individual Therapy  [name] 12/09/2019, 12:35 PM

## 2019-12-09 NOTE — Progress Notes (Signed)
Physical Therapy Session Note  Patient Details  Name: Corey Bennett MRN: [id number] Date of Birth: Jun 20, 1960  Today's Date: 12/09/2019 PT Missed Time: 60 Minutes Missed Time Reason: Patient ill (Comment) (vomiting just prior ot therapy)  Short Term Goals: Week 3:  PT Short Term Goal 1 (Week 3): STG = LTG due to estimated d/c date.  Skilled Therapeutic Interventions/Progress Updates:     PT shadowing speech therapy prior to PT session and pt very nauseous and positive for emesis during speech therapy. PT asks pt if he is willing to participate in PT this AM and pt indicates that he would like to rest. PT will continue to follow.  Therapy Documentation Precautions:  Precautions Precautions: Fall Precaution Comments: PEG, decannulated on 12/04/2019 Restrictions Weight Bearing Restrictions: No    Therapy/Group: Individual Therapy  Beau Fanny, PT, DPT 12/09/2019, 4:03 PM

## 2019-12-09 NOTE — Progress Notes (Signed)
Brief Nutrition Note  RD consulted to add free water flushes to pt's tube feeding regimen. Noted pt with N/V overnight and bolus tube feeds held. Per MD note, KUB showing retained stool throughout left colon. Plan is for soap suds enema. If successful, will resume TF later today.  Current bolus tube feeding regimen: - 355 ml (1.5 cartons) Osmolite 1.5 cal formula QID - ProSource TF 45 ml BID  Bolus tube feeding regimen provides 2210 kcal, 111 grams of protein, and 1082 ml of H2O.  RD will add order for free water flushes of 150 ml q 4 hours to provide an additional 900 ml of free water daily. This will provide a total of 1982 ml free water daily.   Earma Reading, MS, RD, LDN Inpatient Clinical Dietitian Please see AMiON for contact information.

## 2019-12-09 NOTE — Progress Notes (Signed)
Pt vomiting moderate amount of brown and clear liquid. This nurse administered IM Zofran at 0302 on 12/09/19 because of vomiting. Pt received bolus tube feeding at 2011 on 12/08/19. Pt received evening medication at 2318 on 12/08/19.  Pt sleeping and no longer vomitting at this time. No further concern to report.

## 2019-12-09 NOTE — Progress Notes (Signed)
[place] PHYSICAL MEDICINE & REHABILITATION PROGRESS NOTE   Subjective/Complaints: N/V last night, low grade temp. Bolus feeds held. Pt seems to deny nausea at present but responses are inconsistent  ROS: Limited due to cognitive/behavioral/language   Objective:   DG Chest 2 View  Result Date: 12/09/2019 CLINICAL DATA:  CVA with concern for potential aspiration EXAM: CHEST - 2 VIEW COMPARISON:  November 13, 2019 FINDINGS: Tracheostomy no longer appreciable. Lungs are clear. Heart size and pulmonary vascularity are normal. No adenopathy. No bone lesions. IMPRESSION: Lungs clear.  Cardiac silhouette normal. Electronically Signed   By: Bretta Bang III M.D.   On: 12/09/2019 08:32   DG Abd 1 View  Result Date: 12/09/2019 CLINICAL DATA:  Nausea and vomiting. EXAM: ABDOMEN - 1 VIEW COMPARISON:  11/11/2019 FINDINGS: There is no evidence for gaseous bowel dilation to suggest obstruction. Gastrostomy tube again noted. Gas in the medial right upper quadrant is likely in the distal stomach/duodenum, but free air under the liver on a supine film could have this appearance. Prominent stool volume. Visualized bony anatomy unremarkable. IMPRESSION: 1. Gas in the medial right upper quadrant likely in the distal stomach/duodenum although free air under the liver on a supine film could have this appearance. CT scan of the abdomen and pelvis could be performed to further evaluate as clinically warranted. 2. Prominent left colonic stool volume. Electronically Signed   By: Kennith Center M.D.   On: 12/09/2019 06:29   Recent Labs    12/07/19 0624 12/09/19 0758  WBC 5.8 11.5*  HGB 9.4* 8.9*  HCT 28.5* 27.7*  PLT 224 220   Recent Labs    12/06/19 0920 12/07/19 0624  NA 140 141  K 3.9 3.3*  CL 104 104  CO2 23 27  GLUCOSE 151* 136*  BUN 35* 34*  CREATININE 1.26* 1.24  CALCIUM 8.8* 9.0    Intake/Output Summary (Last 24 hours) at 12/09/2019 0900 Last data filed at 12/09/2019 0636 Gross per 24  hour  Intake 0 ml  Output 1250 ml  Net -1250 ml     Physical Exam: Vital Signs Blood pressure (!) 143/66, pulse 97, temperature 99.7 F (37.6 C), resp. rate 16, height 6\' 1"  (1.854 m), weight 69.4 kg, SpO2 98 %. Constitutional: looks uncomfortable . Vital signs reviewed. HEENT: EOMI, oral membranes moist Neck: stoma covered Cardiovascular: RRR without murmur. No JVD    Respiratory/Chest: CTA Bilaterally without wheezes or rales. Normal effort    GI/Abdomen: BS +, NT, soft, peg site looks fine.  Ext: no clubbing, cyanosis, or edema Psych: pleasant and cooperative Skin: skin feels abnl warm Neurologic:   aphasic.   motor strength is 5/5 inleft deltoid, bicep, tricep, grip, hip flexor, knee extensors, ankle dorsiflexor and plantar flexor 3- RIght delt bi, tri, grip.  4- to 4/5 Right HF, KE, ADF. RUE flexor tone Musculoskeletal: RUE sl more tender with PROM.    Assessment/Plan: 1. Functional deficits secondary to Left MCA infarct  which require 15/7 intensity of interdisciplinary therapy in a comprehensive inpatient rehab setting.  Physiatrist is providing close team supervision and 24 hour management of active medical problems listed below.  Physiatrist and rehab team continue to assess barriers to discharge/monitor patient progress toward functional and medical goals  Care Tool:  Bathing    Body parts bathed by patient: Chest, Abdomen, Face, Right upper leg, Left upper leg, Right arm, Buttocks, Front perineal area   Body parts bathed by helper: Left arm, Right lower leg, Left lower leg  Bathing assist Assist Level: Moderate Assistance - Patient 50 - 74%     Upper Body Dressing/Undressing Upper body dressing   What is the patient wearing?: Pull over shirt    Upper body assist Assist Level: Moderate Assistance - Patient 50 - 74%    Lower Body Dressing/Undressing Lower body dressing      What is the patient wearing?: Pants, Incontinence brief     Lower body  assist Assist for lower body dressing: Moderate Assistance - Patient 50 - 74%     Toileting Toileting Toileting Activity did not occur Press photographer and hygiene only): N/A (no void or bm)  Toileting assist Assist for toileting: Maximal Assistance - Patient 25 - 49%     Transfers Chair/bed transfer  Transfers assist     Chair/bed transfer assist level: Contact Guard/Touching assist     Locomotion Ambulation   Ambulation assist   Ambulation activity did not occur: Safety/medical concerns  Assist level: Moderate Assistance - Patient 50 - 74% Assistive device: Walker-rolling Max distance: 25'   Walk 10 feet activity   Assist  Walk 10 feet activity did not occur: Safety/medical concerns  Assist level: Moderate Assistance - Patient - 50 - 74% Assistive device: Walker-rolling   Walk 50 feet activity   Assist Walk 50 feet with 2 turns activity did not occur: Safety/medical concerns  Assist level: 2 helpers Assistive device: Walker-rolling    Walk 150 feet activity   Assist Walk 150 feet activity did not occur: Safety/medical concerns  Assist level: 2 helpers Assistive device: Walker-rolling    Walk 10 feet on uneven surface  activity   Assist Walk 10 feet on uneven surfaces activity did not occur: Safety/medical concerns         Wheelchair     Assist Will patient use wheelchair at discharge?: Yes Type of Wheelchair: Manual Wheelchair activity did not occur: Safety/medical concerns         Wheelchair 50 feet with 2 turns activity    Assist    Wheelchair 50 feet with 2 turns activity did not occur: Safety/medical concerns       Wheelchair 150 feet activity     Assist  Wheelchair 150 feet activity did not occur: Safety/medical concerns       Blood pressure (!) 143/66, pulse 97, temperature 99.7 F (37.6 C), resp. rate 16, height 6\' 1"  (1.854 m), weight 69.4 kg, SpO2 98 %.    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  2. Antithrombotics: -DVT/anticoagulation:SCDs -antiplatelet therapy: Aspirin 81 mg daily and Plavix 75 mg daily 3. Pain Management:Tylenol as needed  -  Gabapentin increased to 200mg  tid for neuropathic pain RUE   -support hemiplegic shoulder  -tone mgt  -pain better controlled 10/5 4. Mood:Provide emotional support -antipsychotic agents: Seroquel 50 mg nightly 5. Neuropsych: This patientis notcapable of making decisions on hisown behalf.  -continue sleep chart to measure sleep hygiene 6. Skin/Wound Care:Routine skin checks 7. Fluids/Electrolytes/Nutrition:Routine in and outs with follow-up chemistries 8. Seizure disorder. Dilantin 175 mg 3 times daily, Keppra 1000 mg twice daily, Vimpat 150 mg twice daily. outpt neuro f/u  -10/4 corrected dilantin level 22, spoke with pharmacy and agree with reduction to 200-175-200 dosing.    -f/u level Friday 9. Diabetes mellitus. Hemoglobin A1c 7.2. Lantus insulin 6  units nightly, NovoLog 2 units every 4 hours. CBG (last 3)  Recent Labs    12/09/19 0003 12/09/19 0427 12/09/19 0843  GLUCAP 264* 92 186*  10/4: slightly elevate-increase novolog to 3u scheduled  10. Dysphagia. NPO except for trials with SLP. Status post gastrostomy tube 11/10/2019 per interventional radiology. advance per SLP.  -initiated bolus feeds yesterday. N/v last night 11. Hypertension. Hydralazine 50 mg every 8 hours, Cardura 2 mg daily, Norvasc 10 mg daily.    -10/1 reasonable control  Vitals:   12/08/19 1925 12/09/19 0300  BP: (!) 137/55 (!) 143/66  Pulse: 98 97  Resp:    Temp: 98.3 F (36.8 C) 99.7 F (37.6 C)  SpO2: 99% 98%    12. Acute on chronic  respiratory failure. Status post tracheostomy tube. Presently with a #6 Shiley flexible cuffless trach as of 11/02/2019 followed by pulmonary services Dr. Levon Hedger  -pt decannulated and tolerating well  -occlusive dressing/pressure to stoma site 13. Hyperlipidemia. Lipitor 14. Urinary retention. ongoing large volume retention    -doxasosin  2mg ---increased to 4mg  9/30   -continue I/o cath prn to keep volumes less than 500cc   -increased urecholine to 50mg  tid 10/4 15. Nausea/vomiting  -Bolus feeds initiated---on hold for moment  -KUB shows retained stool throughout the left colon  -low grade temp  -check  CXR, cbc, cmet today  -SSE---->if successful, we can resume TF later today  LOS: 18 days A FACE TO FACE EVALUATION WAS PERFORMED  12/09/2019, 9:00 AM

## 2019-12-09 NOTE — Progress Notes (Signed)
Nutrition Follow-up  DOCUMENTATION CODES:   Non-severe (moderate) malnutrition in context of chronic illness  INTERVENTION:   When cleared to restart tube feeds by MD/PA, initiate continuous tube feeding via PEG: - Start Osmolite 1.5 @ 20 ml/hr and increase by 10 ml/hr q 4 hours to goal rate of 70 ml/hr to run over 20 hours (tube feeds can be held for up to 4 hours for therapies) - ProSource TF 45 ml BID - Free water flushes of 150 ml q 4 hours  Tube feeding regimen provides2180kcal, 110grams of protein, and of H2O.  Total free water: 1967 ml  NUTRITION DIAGNOSIS:   Moderate Malnutrition related to chronic illness (stroke with dysphagia) as evidenced by mild fat depletion, moderate fat depletion, mild muscle depletion, moderate muscle depletion.  Ongoing  GOAL:   Patient will meet greater than or equal to 90% of their needs  Unmet at this time with TF held  MONITOR:   Diet advancement, Labs, Weight trends, TF tolerance, Skin, I & O's  REASON FOR ASSESSMENT:   Consult Enteral/tube feeding initiation and management (bolus tube feeds)  ASSESSMENT:   59 year old male with PMH of DM, HTN, HLD, left MCA CVA 06/2019 with residual aphasia and right-sided weakness as well as left CEA 09/2019. Presented 10/10/19 with seizure-like activity requiring intubation. CT/MRI showed large territory chronic hemorrhagic infarct in the left frontal lobe. Pt extubated 10/14/19 but required reintubation and tracheostomy performed 10/22/19. Currently NPO with G-tube placed 11/10/19. Admitted to CIR on 9/18.  09/22 - MBS with recommendations for NPO 10/01 - decannulated 10/05 - transitioned to bolus feeds, N/V  Consult received to assess pt's tube feeding regimen. Discussed with PA. Will reorder continuous tube feeds as pt has been unable to tolerate bolus tube feeds due to N/V. KUB showing prominent left colonic stool volume. Per RN notes, soap suds enema administered without success.  Discussed with RN who reports pt continues to have N/V. RN has ben able to aspirate dark brown fluid from pt's PEG tube.  Current weight: 69.4 kg Admission weight: 74.2 kg  Pt with a 4.8 kg (6.5%) weight loss over the last month. This is significant for timeframe. Weight dropped 2 kg from 10/4 to 10/6. Suspect this is related to dehydration. Per RN, plan is for IV fluids due to dehydration.  Medications reviewed and include: pepcid, SSI q 4 hours, Novolog 3 units q 4 hours, lantus 5 units BID, dilantin, KCl 20 mEq daily, senokot  Labs reviewed: BUN 41, creatinine 1.75, hemoglobin 8.9 CBG's: 47-264 x 24 hours  Diet Order:   Diet Order            Diet NPO time specified  Diet effective midnight                 EDUCATION NEEDS:   No education needs have been identified at this time  Skin:  Skin Assessment: Skin Integrity Issues: Incisions: neck (from previous trach)  Last BM:  12/08/19 large type 5  Height:   Ht Readings from Last 1 Encounters:  11/21/19 6\' 1"  (1.854 m)    Weight:   Wt Readings from Last 1 Encounters:  12/09/19 69.4 kg    Ideal Body Weight:  83.6 kg  BMI:  Body mass index is 20.19 kg/m.  Estimated Nutritional Needs:   Kcal:  2000-2200  Protein:  100-115 grams  Fluid:  >/= 2.0 L    02/08/20, MS, RD, LDN Inpatient Clinical Dietitian Please see AMiON for contact  information.

## 2019-12-09 NOTE — Progress Notes (Signed)
Patient did have small results after SSE.Marland Kitchen Spoke with dietary services will change tube feeds from bolus to continuous at low rate and increase as tolerated. Begin IV Reglan. Latest chemistries creatinine 1.75 with free water adjusted 150 mL every 4 hours.

## 2019-12-09 NOTE — Progress Notes (Signed)
Pt Hypoglycemic Event  CBG: 47  Treatment: Administered schedualed Osmolite 1.5 Cal , 355 ml  Symptoms: Drowsy   Follow-up CBG: Time:2036 CBG Result: 101  Possible Reasons for Event: Pt did not receive scheduled bolus  At  Comments/MD notified: Followed protocol    Jillayne Witte R Yancy Knoble

## 2019-12-10 ENCOUNTER — Encounter: Payer: BC Managed Care – PPO | Admitting: Occupational Therapy

## 2019-12-10 ENCOUNTER — Inpatient Hospital Stay (HOSPITAL_COMMUNITY): Payer: BC Managed Care – PPO | Admitting: Occupational Therapy

## 2019-12-10 ENCOUNTER — Inpatient Hospital Stay (HOSPITAL_COMMUNITY): Payer: BC Managed Care – PPO

## 2019-12-10 ENCOUNTER — Inpatient Hospital Stay (HOSPITAL_COMMUNITY): Payer: BC Managed Care – PPO | Admitting: Speech Pathology

## 2019-12-10 LAB — URINALYSIS, COMPLETE (UACMP) WITH MICROSCOPIC
Bacteria, UA: NONE SEEN
Bilirubin Urine: NEGATIVE
Glucose, UA: NEGATIVE mg/dL
Hgb urine dipstick: NEGATIVE
Ketones, ur: NEGATIVE mg/dL
Nitrite: NEGATIVE
Protein, ur: 100 mg/dL — AB
Specific Gravity, Urine: 1.016 (ref 1.005–1.030)
pH: 7 (ref 5.0–8.0)

## 2019-12-10 LAB — CBC
HCT: 28.7 % — ABNORMAL LOW (ref 39.0–52.0)
Hemoglobin: 9.4 g/dL — ABNORMAL LOW (ref 13.0–17.0)
MCH: 30.8 pg (ref 26.0–34.0)
MCHC: 32.8 g/dL (ref 30.0–36.0)
MCV: 94.1 fL (ref 80.0–100.0)
Platelets: 217 10*3/uL (ref 150–400)
RBC: 3.05 MIL/uL — ABNORMAL LOW (ref 4.22–5.81)
RDW: 13.8 % (ref 11.5–15.5)
WBC: 7.5 10*3/uL (ref 4.0–10.5)
nRBC: 0 % (ref 0.0–0.2)

## 2019-12-10 LAB — GLUCOSE, CAPILLARY
Glucose-Capillary: 127 mg/dL — ABNORMAL HIGH (ref 70–99)
Glucose-Capillary: 220 mg/dL — ABNORMAL HIGH (ref 70–99)
Glucose-Capillary: 197 mg/dL — ABNORMAL HIGH (ref 70–99)
Glucose-Capillary: 233 mg/dL — ABNORMAL HIGH (ref 70–99)
Glucose-Capillary: 144 mg/dL — ABNORMAL HIGH (ref 70–99)

## 2019-12-10 MED ORDER — OSMOLITE 1.5 CAL PO LIQD
237.0000 mL | Freq: Every day | ORAL | Status: DC
  Administered 2019-12-10 – 2019-12-15 (×30): 237 mL
  Filled 2019-12-10 (×10): qty 237

## 2019-12-10 NOTE — Progress Notes (Signed)
[place] PHYSICAL MEDICINE & REHABILITATION PROGRESS NOTE   Subjective/Complaints: Had a better night. Large BM this morning just before I arrived. Pt acknowledges that he feels better today  ROS: limited due to language/communication    Objective:   DG Chest 2 View  Result Date: 12/09/2019 CLINICAL DATA:  CVA with concern for potential aspiration EXAM: CHEST - 2 VIEW COMPARISON:  November 13, 2019 FINDINGS: Tracheostomy no longer appreciable. Lungs are clear. Heart size and pulmonary vascularity are normal. No adenopathy. No bone lesions. IMPRESSION: Lungs clear.  Cardiac silhouette normal. Electronically Signed   By: Bretta Bang III M.D.   On: 12/09/2019 08:32   DG Abd 1 View  Result Date: 12/09/2019 CLINICAL DATA:  Nausea and vomiting. EXAM: ABDOMEN - 1 VIEW COMPARISON:  11/11/2019 FINDINGS: There is no evidence for gaseous bowel dilation to suggest obstruction. Gastrostomy tube again noted. Gas in the medial right upper quadrant is likely in the distal stomach/duodenum, but free air under the liver on a supine film could have this appearance. Prominent stool volume. Visualized bony anatomy unremarkable. IMPRESSION: 1. Gas in the medial right upper quadrant likely in the distal stomach/duodenum although free air under the liver on a supine film could have this appearance. CT scan of the abdomen and pelvis could be performed to further evaluate as clinically warranted. 2. Prominent left colonic stool volume. Electronically Signed   By: Kennith Center M.D.   On: 12/09/2019 06:29   Recent Labs    12/09/19 0758 12/10/19 0607  WBC 11.5* 7.5  HGB 8.9* 9.4*  HCT 27.7* 28.7*  PLT 220 217   Recent Labs    12/09/19 0758  NA 144  K 3.6  CL 103  CO2 28  GLUCOSE 175*  BUN 41*  CREATININE 1.75*  CALCIUM 9.1    Intake/Output Summary (Last 24 hours) at 12/10/2019 1110 Last data filed at 12/09/2019 2143 Gross per 24 hour  Intake 59.33 ml  Output 1362 ml  Net -1302.67 ml      Physical Exam: Vital Signs Blood pressure (!) 162/78, pulse 80, temperature 98.1 F (36.7 C), resp. rate 20, height 6\' 1"  (1.854 m), weight 66.2 kg, SpO2 99 %. Constitutional: No distress . Vital signs reviewed. HEENT: EOMI, oral membranes moist Neck: dysphonic, hypophonic Cardiovascular: RRR without murmur. No JVD    Respiratory/Chest: CTA Bilaterally without wheezes or rales. Normal effort    GI/Abdomen: BS +, non-tender, non-distended Ext: no clubbing, cyanosis, or edema  Psych: pleasant and cooperative Neurologic: very alert   aphasic.   motor strength is 5/5 inleft deltoid, bicep, tricep, grip, hip flexor, knee extensors, ankle dorsiflexor and plantar flexor 3- RIght delt bi, tri, grip.  4- to 4/5 Right HF, KE, ADF. RUE flexor tone still present Musculoskeletal: RUE less tender with PROM.    Assessment/Plan: 1. Functional deficits secondary to Left MCA infarct  which require 15/7 intensity of interdisciplinary therapy in a comprehensive inpatient rehab setting.  Physiatrist is providing close team supervision and 24 hour management of active medical problems listed below.  Physiatrist and rehab team continue to assess barriers to discharge/monitor patient progress toward functional and medical goals  Care Tool:  Bathing    Body parts bathed by patient: Chest, Abdomen, Face, Right upper leg, Left upper leg, Right arm, Buttocks, Front perineal area   Body parts bathed by helper: Left arm, Right lower leg, Left lower leg     Bathing assist Assist Level: Moderate Assistance - Patient 50 - 74%  Upper Body Dressing/Undressing Upper body dressing   What is the patient wearing?: Pull over shirt    Upper body assist Assist Level: Moderate Assistance - Patient 50 - 74%    Lower Body Dressing/Undressing Lower body dressing      What is the patient wearing?: Pants, Incontinence brief     Lower body assist Assist for lower body dressing: Moderate Assistance - Patient  50 - 74%     Toileting Toileting Toileting Activity did not occur Press photographer and hygiene only): N/A (no void or bm)  Toileting assist Assist for toileting: Maximal Assistance - Patient 25 - 49%     Transfers Chair/bed transfer  Transfers assist     Chair/bed transfer assist level: Contact Guard/Touching assist     Locomotion Ambulation   Ambulation assist   Ambulation activity did not occur: Safety/medical concerns  Assist level: Moderate Assistance - Patient 50 - 74% Assistive device: Walker-rolling Max distance: 25'   Walk 10 feet activity   Assist  Walk 10 feet activity did not occur: Safety/medical concerns  Assist level: Moderate Assistance - Patient - 50 - 74% Assistive device: Walker-rolling   Walk 50 feet activity   Assist Walk 50 feet with 2 turns activity did not occur: Safety/medical concerns  Assist level: 2 helpers Assistive device: Walker-rolling    Walk 150 feet activity   Assist Walk 150 feet activity did not occur: Safety/medical concerns  Assist level: 2 helpers Assistive device: Walker-rolling    Walk 10 feet on uneven surface  activity   Assist Walk 10 feet on uneven surfaces activity did not occur: Safety/medical concerns         Wheelchair     Assist Will patient use wheelchair at discharge?: Yes Type of Wheelchair: Manual Wheelchair activity did not occur: Safety/medical concerns         Wheelchair 50 feet with 2 turns activity    Assist    Wheelchair 50 feet with 2 turns activity did not occur: Safety/medical concerns       Wheelchair 150 feet activity     Assist  Wheelchair 150 feet activity did not occur: Safety/medical concerns       Blood pressure (!) 162/78, pulse 80, temperature 98.1 F (36.7 C), resp. rate 20, height 6\' 1"  (1.854 m), weight 66.2 kg, SpO2 99 %.    Medical Problem List and Plan: 1.Right hemiparesis with severe aphasia/dysphagiasecondary to left MCA  distribution infarct as well as history of left MCA infarct -patient may shower   -ELOS/Goals: 21-24d  -Continue CIR---downgraded to 15/7 intensity--looking for rehab facility in [place].  2. Antithrombotics: -DVT/anticoagulation:SCDs -antiplatelet therapy: Aspirin 81 mg daily and Plavix 75 mg daily 3. Pain Management:Tylenol as needed  -  Gabapentin increased to 200mg  tid for neuropathic pain RUE   -support hemiplegic shoulder  -tone mgt  -pain better controlled 10/7  -will add low dose baclofen for RUE flexor tone 4. Mood:Provide emotional support -antipsychotic agents: Seroquel 50 mg nightly 5. Neuropsych: This patientis notcapable of making decisions on hisown behalf.  -continue sleep chart to measure sleep hygiene 6. Skin/Wound Care:Routine skin checks 7. Fluids/Electrolytes/Nutrition:Routine in and outs with follow-up chemistries 8. Seizure disorder. Dilantin 175 mg 3 times daily, Keppra 1000 mg twice daily, Vimpat 150 mg twice daily. outpt neuro f/u  -10/4 corrected dilantin level 22, spoke with pharmacy and agree with reduction to 200-175-200 dosing.    -f/u level Friday 9. Diabetes mellitus. Hemoglobin A1c 7.2. Lantus insulin 6 units nightly, NovoLog 2 units  every 4 hours. CBG (last 3)  Recent Labs    12/09/19 2358 12/10/19 0440 12/10/19 0815  GLUCAP 176* 127* 220*  10/4: slightly elevate-increase novolog to 3u scheduled  10. Dysphagia. NPO except for trials with SLP. Status post gastrostomy tube 11/10/2019 per interventional radiology. advance per SLP.  -resume boluses today 10/7 11. Hypertension. Hydralazine 50 mg every 8 hours, Cardura 2 mg daily, Norvasc 10 mg daily.    -10/1 reasonable control  Vitals:   12/09/19 2354 12/10/19 0445  BP: (!) 144/69 (!) 162/78  Pulse: 81 80  Resp: 18 20  Temp: 98.2 F (36.8 C) 98.1 F (36.7 C)  SpO2: 98% 99%    12. Acute on chronic respiratory failure. Status  post tracheostomy tube. Presently with a #6 Shiley flexible cuffless trach as of 11/02/2019 followed by pulmonary services Dr. Levon Hedger  -pt decannulated and tolerating well  -occlusive dressing/pressure to stoma site 13. Hyperlipidemia. Lipitor 14. Urinary retention. ongoing large volume retention    -doxasosin  2mg ---increased to 4mg  9/30   -continue I/o cath prn to keep volumes less than 500cc   -increased urecholine to 50mg  tid 10/4 15. Nausea/vomiting/low grade temp  -improved. Suspect that it was d/t constipation  -large BM this morning  -afebrile, last elevated temp was 99.4 at 1143 yesterday  -CXR, cbc unremarkable. UA +/-  -began empiric keflex yesterday, ucx pending  -resume bolus TF today  LOS: 19 days A FACE TO FACE EVALUATION WAS PERFORMED  12/10/2019, 11:10 AM

## 2019-12-10 NOTE — Progress Notes (Signed)
Nutrition Follow-up  DOCUMENTATION CODES:   Non-severe (moderate) malnutrition in context of chronic illness  INTERVENTION:   Transition pt to bolus tube feeding regimen: - 237 ml (1 carton) of Osmolite 1.5 cal formula 6 x daily  First bolus: 50 ml  Second bolus: 100 ml  Third bolus: 150 ml  Fourth bolus: 200 ml  Fifth bolus (goal): 237 ml (1 carton) - ProSource TF 45 ml BID - Free water flushes of 150 ml q 4 hours  Bolus tube feeding regimen at goal with free water flushes provides 2213 kcal, 111 grams of protein, and 1984 ml of H2O.  NUTRITION DIAGNOSIS:   Moderate Malnutrition related to chronic illness (stroke with dysphagia) as evidenced by mild fat depletion, moderate fat depletion, mild muscle depletion, moderate muscle depletion.  Ongoing  GOAL:   Patient will meet greater than or equal to 90% of their needs  Will be met via bolus TF regimen at goal  MONITOR:   Diet advancement, Labs, Weight trends, TF tolerance, Skin, I & O's  REASON FOR ASSESSMENT:   Consult Enteral/tube feeding initiation and management (bolus tube feeds)  ASSESSMENT:   59 year old male with PMH of DM, HTN, HLD, left MCA CVA 06/2019 with residual aphasia and right-sided weakness as well as left CEA 09/2019. Presented 10/10/19 with seizure-like activity requiring intubation. CT/MRI showed large territory chronic hemorrhagic infarct in the left frontal lobe. Pt extubated 10/14/19 but required reintubation and tracheostomy performed 10/22/19. Currently NPO with G-tube placed 11/10/19. Admitted to CIR on 9/18.  09/22 - MBS with recommendations for NPO 10/01 - decannulated 10/05 - transitioned to bolus feeds, N/V 10/06 - transitioned back to continuous feeds  Consult received to transition pt back to bolus TF. Pt currently tolerating continuous tube feeds without issue. Pt with large bowel movement this AM per RN.  Discussed plan with RN. Will start with very low volume bolus and slowly titrate  volume up to goal.  Current weight: 66.2 kg Admission weight: 74.2 kg  Pt's weight trending down. Suspect this is related to inadequate nutrition over the last several days due to tube feeds being held related to N/V. Will continue to monitor weights as pt gets back to goal tube feeding regimen.  Medications reviewed and include: pepcid, SSI q 4 hours, Novolog 3 units q 4 hours, lantus 5 units BID, IV Reglan 5 mg q 8 hours, dilantin, KCl, senokot  Labs reviewed: BUN 41, creatinine 1.75, elevated LFTs, hemoglobin 9.4 CBG's: 111-220 x 24 hours  Diet Order:   Diet Order            Diet NPO time specified  Diet effective midnight                 EDUCATION NEEDS:   No education needs have been identified at this time  Skin:  Skin Assessment: Skin Integrity Issues: Incisions: neck (from previous trach)  Last BM:  12/10/19 large type 7  Height:   Ht Readings from Last 1 Encounters:  11/21/19 '6\' 1"'  (1.854 m)    Weight:   Wt Readings from Last 1 Encounters:  12/10/19 66.2 kg    Ideal Body Weight:  83.6 kg  BMI:  Body mass index is 19.26 kg/m.  Estimated Nutritional Needs:   Kcal:  2000-2200  Protein:  100-115 grams  Fluid:  >/= 2.0 L    Gaynell Face, MS, RD, LDN Inpatient Clinical Dietitian Please see AMiON for contact information.

## 2019-12-10 NOTE — Plan of Care (Signed)
  Problem: RH BOWEL ELIMINATION Goal: RH STG MANAGE BOWEL WITH ASSISTANCE Description: STG Manage Bowel with min Assistance. Outcome: Progressing   Problem: RH SKIN INTEGRITY Goal: RH STG SKIN FREE OF INFECTION/BREAKDOWN Description: No new skin breakdown this admission Outcome: Progressing Goal: RH STG MAINTAIN SKIN INTEGRITY WITH ASSISTANCE Description: STG Maintain Skin Integrity With min Assistance. Outcome: Progressing

## 2019-12-10 NOTE — Progress Notes (Signed)
Physical Therapy Session Note  Patient Details  Name: Corey Bennett MRN: [id number] Date of Birth: Dec 24, 1960  Today's Date: 12/10/2019 PT Individual Time: 1430-1515 PT Individual Time Calculation (min): 45 min   Short Term Goals: Week 3:  PT Short Term Goal 1 (Week 3): STG = LTG due to estimated d/c date.  Skilled Therapeutic Interventions/Progress Updates:     Pt received asleep in bed. Easily roused and nods agreement to therapy. Supine to sit with supervision, increased time, and use of bed features. Sit to stand multiple times from EOB with minA and demos posterior LOBs. Stand step transfer to [facility] without AD and minA. In WC, PT dons pt's shoes with RLE PLS AFO. While PT is donning shoes, pt urinates and saturates brief and WC. Pt ambulates to bathroom with minA and RW and sits on BSC. PT provides maxA for upper and lower body cleaning and assists pt to don clean gown and brief. Pt ambulates back to bed with minA and RW. Requests to rest and misses final 15 minutes of session due to fatigue. Left with alarm intact and all needs within reach.  PAIN: pt indicates that he is in pain but does not point or verbalize where it is. PT provides repositioning of RLE and rest for pain management.  Therapy Documentation Precautions:  Precautions Precautions: Fall Precaution Comments: PEG, decannulated on 12/04/2019 Restrictions Weight Bearing Restrictions: No      Therapy/Group: Individual Therapy  Beau Fanny, PT, DPT 12/10/2019, 3:40 PM

## 2019-12-10 NOTE — Progress Notes (Signed)
Speech Language Pathology Weekly Progress and Session Note  Patient Details  Name: Corey Bennett MRN: [id number] Date of Birth: 1960/12/18  Beginning of progress report period: December 04, 2019 End of progress report period: December 10, 2019  Today's Date: 12/10/2019 SLP Missed Time: 30 Minutes Missed Time Reason: Patient fatigue  Short Term Goals: Week 3: SLP Short Term Goal 1 (Week 3): Patient will participate in repeat MBS to reassess swallow function SLP Short Term Goal 1 - Progress (Week 3): Not met SLP Short Term Goal 2 (Week 3): Patient will point to object photos or pictures in field of 4-6 with 85% accuracy and minA SLP Short Term Goal 2 - Progress (Week 3): Not met SLP Short Term Goal 3 (Week 3): Patient will tolerate trials of nectar thick and thin liquids with minimal overt s/s aspiration/penetration SLP Short Term Goal 3 - Progress (Week 3): Met SLP Short Term Goal 4 (Week 3): Patient will utilize communication board of 6-8 printed words/pictures to comment and request during functional tasks. SLP Short Term Goal 4 - Progress (Week 3): Not met SLP Short Term Goal 5 (Week 3): Patient will imitate at 4-5 word phrase level with modA and 70% accuracy SLP Short Term Goal 5 - Progress (Week 3): Not met    New Short Term Goals: Week 4: SLP Short Term Goal 1 (Week 4): STGs=LTGs due to ELOS  Weekly Progress Updates: Patient has made inconsistent gains and has met 1 of 5 STGs this reporting period. Patient is now decannulated and is demonstrating overall increased attempts at verbal communication. However, verbal attempts can be inconsistent and unintelligible at times leading to frustration. With encouragement and Min A verbal cues, patient can utilize a simple communication board with written words to express basic wants/needs. Patient is consuming trials of nectar-thick liquids via tsp with Mod A verbal and tactile cues needed for use of a chin tuck and for liquids via tsp. 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 Patient's function can also be impacted by what appears to be anxiety at times. Patient and family education. Patient's current discharge plan is to transfer to another short-term inpatient rehabilitation center closer to family in [place].  Patient and family education ongoing. Patient would benefit from continued skilled SLP intervention to maximize his communication and swallowing function prior to discharge.     Intensity: Minumum of 1-2 x/day, 30 to 90 minutes Frequency: 3 to 5 out of 7 days Duration/Length of Stay: 12/14/19 Treatment/Interventions: English as a second language teacher;Dysphagia/aspiration precaution training;Environmental controls;Patient/family education;Speech/Language facilitation;Internal/external aids;Multimodal communication approach;Cognitive remediation/compensation;Therapeutic Activities;Functional tasks   Daily Session  Patient missed 30 minutes of skilled SLP intervention due to fatigue. After multiple episode of emesis yesterday, patient received and enema due to severe constipation. Patient had 2 large bowel movements this morning requiring a shower after each one resulting in fatigue and lethargy. Will re-attempt as able.        [place], [place] 12/10/2019, 6:39 AM

## 2019-12-10 NOTE — Progress Notes (Signed)
Occupational Therapy Session Note  Patient Details  Name: Noel Rodier MRN: [id number] Date of Birth: [birth year]  Today's Date: 12/10/2019 OT Individual Time:  - patient missed 45 minutes of scheduled session due to fatigue.      Short Term Goals: Week 2:  OT Short Term Goal 1 (Week 2): Pt will tolerate therapeutic activity for ~15 min in un supported sitting to improved cardiopulmonary endurance OT Short Term Goal 1 - Progress (Week 2): Met OT Short Term Goal 2 (Week 2): Pt will perform sit to stands in ADL tasks with contact guard OT Short Term Goal 2 - Progress (Week 2): Met OT Short Term Goal 3 (Week 2): Pt will perform stand pivot transfers to/from BSC/toilet with CGA OT Short Term Goal 3 - Progress (Week 2): Met OT Short Term Goal 4 (Week 2): Pt will perform LB dress with Min A with sit to stand to don over hips OT Short Term Goal 4 - Progress (Week 2): Met Week 3:  OT Short Term Goal 1 (Week 3): STGs = LTGs  Skilled Therapeutic Interventions/Progress Updates:    attempted to see patient for scheduled session - he wakes with stimulation but has difficulty keeping eyes open.  Offered light adl tasks but he declined at this time.  As per nursing he had a difficulty morning with multiple incontinence episodes and needing to get cleaned up both bed level and in the shower.  Will attempt later as appropriate   Therapy Documentation Precautions:  Precautions Precautions: Fall Precaution Comments: PEG, decannulated on 12/04/2019 Restrictions Weight Bearing Restrictions: No   Therapy/Group: Individual Therapy  Carlos Levering 12/10/2019, 7:39 AM

## 2019-12-11 ENCOUNTER — Inpatient Hospital Stay (HOSPITAL_COMMUNITY): Payer: BC Managed Care – PPO | Admitting: Occupational Therapy

## 2019-12-11 ENCOUNTER — Inpatient Hospital Stay (HOSPITAL_COMMUNITY): Payer: BC Managed Care – PPO | Admitting: Physical Therapy

## 2019-12-11 ENCOUNTER — Inpatient Hospital Stay (HOSPITAL_COMMUNITY): Payer: BC Managed Care – PPO | Admitting: Speech Pathology

## 2019-12-11 LAB — BASIC METABOLIC PANEL
Anion gap: 11 (ref 5–15)
BUN: 41 mg/dL — ABNORMAL HIGH (ref 6–20)
CO2: 29 mmol/L (ref 22–32)
Calcium: 8.6 mg/dL — ABNORMAL LOW (ref 8.9–10.3)
Chloride: 101 mmol/L (ref 98–111)
Creatinine, Ser: 1.5 mg/dL — ABNORMAL HIGH (ref 0.61–1.24)
GFR, Estimated: 50 mL/min — ABNORMAL LOW (ref >60)
Glucose, Bld: 153 mg/dL — ABNORMAL HIGH (ref 70–99)
Potassium: 3.4 mmol/L — ABNORMAL LOW (ref 3.5–5.1)
Sodium: 141 mmol/L (ref 135–145)

## 2019-12-11 LAB — GLUCOSE, CAPILLARY
Glucose-Capillary: 126 mg/dL — ABNORMAL HIGH (ref 70–99)
Glucose-Capillary: 134 mg/dL — ABNORMAL HIGH (ref 70–99)
Glucose-Capillary: 166 mg/dL — ABNORMAL HIGH (ref 70–99)
Glucose-Capillary: 169 mg/dL — ABNORMAL HIGH (ref 70–99)
Glucose-Capillary: 186 mg/dL — ABNORMAL HIGH (ref 70–99)
Glucose-Capillary: 190 mg/dL — ABNORMAL HIGH (ref 70–99)
Glucose-Capillary: 249 mg/dL — ABNORMAL HIGH (ref 70–99)
Glucose-Capillary: 38 mg/dL — CL (ref 70–99)
Glucose-Capillary: 69 mg/dL — ABNORMAL LOW (ref 70–99)

## 2019-12-11 LAB — ALBUMIN: Albumin: 3 g/dL — ABNORMAL LOW (ref 3.5–5.0)

## 2019-12-11 LAB — PHENYTOIN LEVEL, TOTAL: Phenytoin Lvl: 15.9 ug/mL (ref 10.0–20.0)

## 2019-12-11 MED ORDER — BACLOFEN 5 MG HALF TABLET
5.0000 mg | ORAL_TABLET | Freq: Two times a day (BID) | ORAL | Status: DC
  Filled 2019-12-11 (×2): qty 1

## 2019-12-11 MED ORDER — BACLOFEN 5 MG HALF TABLET
5.0000 mg | ORAL_TABLET | Freq: Two times a day (BID) | ORAL | Status: DC
  Administered 2019-12-11 – 2019-12-13 (×5): 5 mg
  Filled 2019-12-11 (×5): qty 1

## 2019-12-11 MED ORDER — DOXAZOSIN MESYLATE 4 MG PO TABS
6.0000 mg | ORAL_TABLET | Freq: Every day | ORAL | Status: DC
  Administered 2019-12-12 – 2019-12-15 (×4): 6 mg
  Filled 2019-12-11 (×4): qty 1

## 2019-12-11 MED ORDER — PHENYTOIN 125 MG/5ML PO SUSP
175.0000 mg | Freq: Three times a day (TID) | ORAL | Status: DC
  Administered 2019-12-11 – 2019-12-13 (×6): 175 mg
  Filled 2019-12-11 (×7): qty 8

## 2019-12-11 MED ORDER — DEXTROSE 50 % IV SOLN
INTRAVENOUS | Status: AC
  Administered 2019-12-11: 12.5 mL
  Filled 2019-12-11: qty 50

## 2019-12-11 MED ORDER — INSULIN ASPART 100 UNIT/ML ~~LOC~~ SOLN
3.0000 [IU] | Freq: Every day | SUBCUTANEOUS | Status: DC
  Administered 2019-12-11 – 2019-12-15 (×21): 3 [IU] via SUBCUTANEOUS

## 2019-12-11 MED ORDER — INSULIN ASPART 100 UNIT/ML ~~LOC~~ SOLN
0.0000 [IU] | Freq: Every day | SUBCUTANEOUS | Status: DC
  Administered 2019-12-11 (×2): 1 [IU] via SUBCUTANEOUS
  Administered 2019-12-11 (×2): 2 [IU] via SUBCUTANEOUS
  Administered 2019-12-12: 1 [IU] via SUBCUTANEOUS
  Administered 2019-12-12 – 2019-12-13 (×3): 2 [IU] via SUBCUTANEOUS
  Administered 2019-12-13: 1 [IU] via SUBCUTANEOUS
  Administered 2019-12-13 – 2019-12-14 (×2): 2 [IU] via SUBCUTANEOUS
  Administered 2019-12-14: 1 [IU] via SUBCUTANEOUS
  Administered 2019-12-14: 2 [IU] via SUBCUTANEOUS
  Administered 2019-12-15: 1 [IU] via SUBCUTANEOUS
  Administered 2019-12-15: 2 [IU] via SUBCUTANEOUS
  Administered 2019-12-15: 1 [IU] via SUBCUTANEOUS

## 2019-12-11 MED ORDER — SODIUM CHLORIDE 0.45 % IV SOLN: INTRAVENOUS | Status: DC

## 2019-12-11 NOTE — Plan of Care (Signed)
  Problem: RH BLADDER ELIMINATION Goal: RH STG MANAGE BLADDER WITH ASSISTANCE Description: STG Manage Bladder With min Assistance Outcome: Progressing Goal: RH STG MANAGE BLADDER WITH MEDICATION WITH ASSISTANCE Description: STG Manage Bladder With Medication With min Assistance. Outcome: Progressing   Problem: RH BOWEL ELIMINATION Goal: RH STG MANAGE BOWEL WITH ASSISTANCE Description: STG Manage Bowel with min Assistance. Outcome: Progressing

## 2019-12-11 NOTE — Progress Notes (Signed)
Hypoglycemic Event  CBG: 38  Treatment: Orange juice and tube feed   Symptoms: Drowsy   Follow-up CBG: Time:0425 CBG Result: 69  Possible Reasons for Event: New tube feed regimen  Comments/MD notified:Protocol followed     Corey Bennett R Zeniah Briney

## 2019-12-11 NOTE — Progress Notes (Signed)
Speech Language Pathology Daily Session Note  Patient Details  Name: Corey Bennett MRN: [id number] Date of Birth: [birth year]  Today's Date: 12/13/2019 SLP Individual Time: 1345-1420 SLP Individual Time Calculation (min): 35 min  Short Term Goals: Week 4: SLP Short Term Goal 1 (Week 4): STGs=LTGs due to ELOS  Skilled Therapeutic Interventions: Pt was seen for skilled ST targeting communication and swallow goals. SLP facilitated session with assistance phrasing questions from NT and SLP to for functional yes/no communication to communicate pain and location as well as needs throughout session. He did become frustrated, requiring encouragement for responses, whether verbal or gestural. Pt also required Max A multimodal cues for accuracy in use of EMST device to perform 10 reps of exercises with device set to 9 cm H2O resistance (pt unable to accurately complete the exercises at 10 cm H2O, which is the level it was set to at baseline). Pt left laying in bed with alarm set and needs within reach. Continue per current plan of care.          Pain Pain Assessment Pain Scale: Faces Faces Pain Scale: Hurts a little bit Pain Type: Surgical pain Pain Location: Abdomen Pain Descriptors / Indicators: Aching Pain Onset: On-going Pain Intervention(s): RN made aware;Emotional support;Repositioned Multiple Pain Sites: No  Therapy/Group: Individual Therapy  [name] 12/13/2019, 2:27 PM

## 2019-12-11 NOTE — Progress Notes (Signed)
Physical Therapy Session Note  Patient Details  Name: Corey Bennett MRN: [id number] Date of Birth: 1960-12-12  Today's Date: 12/11/2019 PT Individual Time: 9532-0233 PT Individual Time Calculation (min): 12 min   Short Term Goals: Week 3:  PT Short Term Goal 1 (Week 3): STG = LTG due to estimated d/c date.  Skilled Therapeutic Interventions/Progress Updates:  Pt received asleep in bed, requiring cold cloth on forehead, sternal rub, and ongoing verbal/tactile cuing to awaken. Pt with productive cough & encouraged to use yaunker but requires total assist from PT. PT obtains & applies oral gel to extremely chapped lips. PT provides max encouragement for participation with pt repeatedly stating "no", transferring supine>sit with supervision, hospital bed features & extra time, then pt returns supine in same manner. Pt requests to rest. Pt left in bed with alarm set, all needs in reach.   Therapy Documentation Precautions:  Precautions Precautions: Fall Precaution Comments: PEG, decannulated on 12/04/2019 Restrictions Weight Bearing Restrictions: No General: PT Amount of Missed Time (min): 47 Minutes PT Missed Treatment Reason: Patient unwilling to participate;Patient fatigue    Therapy/Group: Individual Therapy  Sandi Mariscal 12/11/2019, 10:24 AM

## 2019-12-11 NOTE — Progress Notes (Addendum)
Patient ID: Corey Bennett, male   DOB: [birth year], 59 y.o.   MRN: [id number] Per MD pt is medically stable to transfer early next week. Spoke with Ronald-brother who reports trasnport set up for 10/12 @ 3:00 pm. Contacted [name]/RN at [facility] to confirm transfer Tuesday. She will need worker to fax notes on Monday. Discussed with [name] getting pt's medications prior to Tuesday due to will need them while being transported in ambulance. Will touch base with [facility] med-ambulance service to see if needs to speak with RN or PA regarding pt's care needs. Working on tube feeding for transfer.  11:10 AM Spoke with Kirsten-Trans med transportion to confirm discharge date and will ask Dan-PA to contant to discuss care needs. Spoke with [name]-brother regarding getting medications filled at [facility] pharmacy and he was agreeable to this. Will let Dan-PA know.  2:31 PM Spoke with [name]-brother to inform a MBS was not done this wee due to pt's nausea and vomiting and bowel issues. Brother was not aware of this, but glad he is better now. Touch base with on Monday to confirm transfer Tuesday.

## 2019-12-11 NOTE — Progress Notes (Signed)
Speech Language Pathology Daily Session Note  Patient Details  Name: Corey Bennett MRN: [id number] Date of Birth: 1960/10/04  Today's Date: 12/11/2019 SLP Individual Time: 1255-1320 SLP Individual Time Calculation (min): 25 min  Short Term Goals: Week 4: SLP Short Term Goal 1 (Week 4): STGs=LTGs due to ELOS  Skilled Therapeutic Interventions: Skilled treatment session focused on communication and dysphagia goals. Upon arrival, patient was asleep but easily awakened and agreeable to participate in treatment session. SLP facilitated session by providing Mod A verbal and visual cues for patient to perform 15 repetitions of EMST at 10 cm H2O accurately due to apraxia. Patient spontaneously requested a sandwich and SLP provided education as to why that is currently unsafe at this time. Patient also answered all basic yes/no questions appropriately. SLP administered trials of nectar-thick liquids via tsp with Mod verbal and tactile cues for use of a chin tuck resulting in overt coughing in 100% trials and prolonged recovery in regards to labored breathing. Max A multimodal cues were needed in attempts to clear suspects residuals with minimal success due to apraxia. Patient left upright in bed with alarm on and all needs within reach. Continue with current plan of care.      Pain No/Denies Pain   Therapy/Group: Individual Therapy  Corey Bennett 12/11/2019, 1:20 PM

## 2019-12-11 NOTE — Progress Notes (Addendum)
Hypoglycemic Event  CBG: 69  Treatment: D 50   Symptoms: Drowsy  Follow-up CBG: Time 0440 CBG Result:166  Possible Reasons for Event:New tube feed regimen  Comments/MD notified:Protocol Followed, PA Dan A  notifid     Trudee Chirino R Nazia Rhines

## 2019-12-11 NOTE — Progress Notes (Signed)
Physical Therapy Note  Patient Details  Name: Corey Bennett MRN: [id number] Date of Birth: 1960/10/08 Today's Date: 12/11/2019    PT returned to attempt to make up time from this morning. Pt received in bed & reports he would like to rest. Encouraged pt to participate in sessions tomorrow. Pt left in bed with all needs at hand.   Sandi Mariscal 12/11/2019, 4:03 PM

## 2019-12-11 NOTE — Progress Notes (Signed)
Occupational Therapy Session Note  Patient Details  Name: Nahmir Zeidman MRN: [id number] Date of Birth: 07/05/1960  Today's Date: 12/11/2019 OT Individual Time: 1505-1520 OT Individual Time Calculation (min): 15 min   Short Term Goals: Week 3:  OT Short Term Goal 1 (Week 3): STGs = LTGs  Skilled Therapeutic Interventions/Progress Updates:    Pt supine in bed, nurse present upon OT arrival, reporting pt has not been feeling well today and requiring IV fluids currently.  Pt refusing self care with OT at this time, but agreeable to gentle PROM and bed repositioning.  Pt tolerated PROM of right shoulder to 90 degrees and below and elbow with no signs of pain.  Pt exhibiting signs of pain with gentle wrist PROM therefore short arc performed with improved tolerance.  Pts hand repositioned in right RHS and instructed pt through repositioning towards HOB using bridge technique with pt needing supervision only.  Positioned RUE in slight scaption of shoulder and elevation of hand using pillows for support and comfort.  Call bell in reach, bed alarm on.  Pt missed 15 mins of OT session.  Therapy Documentation Precautions:  Precautions Precautions: Fall Precaution Comments: PEG, decannulated on 12/04/2019 Restrictions Weight Bearing Restrictions: No   Therapy/Group: Individual Therapy  Amie Critchley 12/11/2019, 4:02 PM

## 2019-12-11 NOTE — Progress Notes (Signed)
Phenytoin level came back at 15.9>>corrected for albumin 22.7. D/w Dr. Riley Kill and we will skip the next dose and reduce the maintenance dose 175mg  per tube q8.  Check another level in 5-7day  04-18-1978, PharmD, [place], AAHIVP, CPP Infectious Disease Pharmacist 12/11/2019 10:05 AM

## 2019-12-11 NOTE — Progress Notes (Addendum)
[place] PHYSICAL MEDICINE & REHABILITATION PROGRESS NOTE   Subjective/Complaints: Pt indicates he's feeling better. Afebrile. Had large BM yesterday. Tolerating bolus feeds so far. Low cbg's this morning, SSI doesn't coincide with bolus schedule  ROS: Limited due to cognitive/behavioral   Objective:   No results found. Recent Labs    12/09/19 0758 12/10/19 0607  WBC 11.5* 7.5  HGB 8.9* 9.4*  HCT 27.7* 28.7*  PLT 220 217   Recent Labs    12/09/19 0758  NA 144  K 3.6  CL 103  CO2 28  GLUCOSE 175*  BUN 41*  CREATININE 1.75*  CALCIUM 9.1    Intake/Output Summary (Last 24 hours) at 12/11/2019 1303 Last data filed at 12/11/2019 0830 Gross per 24 hour  Intake --  Output 1000 ml  Net -1000 ml     Physical Exam: Vital Signs Blood pressure (!) 148/58, pulse 84, temperature 98.2 F (36.8 C), resp. rate 20, height 6\' 1"  (1.854 m), weight 66.2 kg, SpO2 97 %. Constitutional: No distress . Vital signs reviewed. HEENT: EOMI, oral membranes moist Neck: supple Cardiovascular: RRR without murmur. No JVD    Respiratory/Chest: CTA Bilaterally without wheezes or rales. Normal effort    GI/Abdomen: BS +, non-tender, non-distended Ext: no clubbing, cyanosis, or edema Psych: pleasant and cooperative Neurologic: alert,    aphasic.   motor strength is 5/5 left deltoid, bicep, tricep, grip, hip flexor, knee extensors, ankle dorsiflexor and plantar flexor 3- RIght delt bi, tri, grip.  4-/5 Right HF, KE, ADF. RUE flexor tone still present. Minimal tone in RLE Musculoskeletal: RUE minimally tender with PROM.    Assessment/Plan: 1. Functional deficits secondary to Left MCA infarct  which require 15/7 intensity of interdisciplinary therapy in a comprehensive inpatient rehab setting.  Physiatrist is providing close team supervision and 24 hour management of active medical problems listed below.  Physiatrist and rehab team continue to assess barriers to discharge/monitor patient  progress toward functional and medical goals  Care Tool:  Bathing    Body parts bathed by patient: Chest, Abdomen, Face, Right upper leg, Left upper leg, Right arm, Buttocks, Front perineal area   Body parts bathed by helper: Left arm, Right lower leg, Left lower leg     Bathing assist Assist Level: Moderate Assistance - Patient 50 - 74%     Upper Body Dressing/Undressing Upper body dressing   What is the patient wearing?: Pull over shirt    Upper body assist Assist Level: Moderate Assistance - Patient 50 - 74%    Lower Body Dressing/Undressing Lower body dressing      What is the patient wearing?: Pants, Incontinence brief     Lower body assist Assist for lower body dressing: Moderate Assistance - Patient 50 - 74%     Toileting Toileting Toileting Activity did not occur 17/7 and hygiene only): N/A (no void or bm)  Toileting assist Assist for toileting: Maximal Assistance - Patient 25 - 49%     Transfers Chair/bed transfer  Transfers assist     Chair/bed transfer assist level: Minimal Assistance - Patient > 75%     Locomotion Ambulation   Ambulation assist   Ambulation activity did not occur: Safety/medical concerns  Assist level: Minimal Assistance - Patient > 75% Assistive device: Walker-rolling Max distance: 10'   Walk 10 feet activity   Assist  Walk 10 feet activity did not occur: Safety/medical concerns  Assist level: Minimal Assistance - Patient > 75% Assistive device: Walker-rolling   Walk 50 feet  activity   Assist Walk 50 feet with 2 turns activity did not occur: Safety/medical concerns  Assist level: 2 helpers Assistive device: Walker-rolling    Walk 150 feet activity   Assist Walk 150 feet activity did not occur: Safety/medical concerns  Assist level: 2 helpers Assistive device: Walker-rolling    Walk 10 feet on uneven surface  activity   Assist Walk 10 feet on uneven surfaces activity did not occur:  Safety/medical concerns         Wheelchair     Assist Will patient use wheelchair at discharge?: Yes Type of Wheelchair: Manual Wheelchair activity did not occur: Safety/medical concerns         Wheelchair 50 feet with 2 turns activity    Assist    Wheelchair 50 feet with 2 turns activity did not occur: Safety/medical concerns       Wheelchair 150 feet activity     Assist  Wheelchair 150 feet activity did not occur: Safety/medical concerns       Blood pressure (!) 148/58, pulse 84, temperature 98.2 F (36.8 C), resp. rate 20, height 6\' 1"  (1.854 m), weight 66.2 kg, SpO2 97 %.    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  2. Antithrombotics: -DVT/anticoagulation:SCDs -antiplatelet therapy: Aspirin 81 mg daily and Plavix 75 mg daily 3. Pain Management:Tylenol as needed  -  Gabapentin increased to 200mg  tid for neuropathic pain RUE   -support hemiplegic shoulder  -tone mgt  -pain better controlled 10/7  -10/8 will add low dose baclofen (5mg  bid initially) for RUE flexor tone 4. Mood:Provide emotional support -antipsychotic agents: Seroquel 50 mg nightly 5. Neuropsych: This patientis notcapable of making decisions on hisown behalf.  -continue sleep chart to measure sleep hygiene 6. Skin/Wound Care:Routine skin checks 7. Fluids/Electrolytes/Nutrition:BUN/Cr trending up with recent TF issues, stable today but remain elevated  -begin IVF 1/2ns at 75cc/hr  -recheck BMET 10/9 8. Seizure disorder. Dilantin 175 mg 3 times daily, Keppra 1000 mg twice daily, Vimpat 150 mg twice daily. outpt neuro f/u  -10/8 corrected dilantin level 22.7, spoke with pharmD and  will reduce dilantin to 175mg  tid 9. Diabetes mellitus. Hemoglobin A1c 7.2. Lantus insulin 6 units nightly, NovoLog 2 units every 4 hours.    CBG (last 3)  Recent Labs    12/11/19 0440 12/11/19 0803 12/11/19 1136  GLUCAP 166* 186* 190*   10/8: low cbg's early this morning   -continue increased novolog to 3u scheduled 6x day to coincide with boluses  10. Dysphagia. NPO except for trials with SLP. Status post gastrostomy tube 11/10/2019 per interventional radiology. advance per SLP.  -resumed boluses 10/7 11. Hypertension. Hydralazine 50 mg every 8 hours, Cardura 4 mg daily, Norvasc 10 mg daily.    -10/8 bordereline control   -increase cardura to 6mg  Vitals:   12/10/19 2000 12/11/19 0405  BP: (!) 143/64 (!) 148/58  Pulse: 80 84  Resp: 19 20  Temp: 97.8 F (36.6 C) 98.2 F (36.8 C)  SpO2: 99% 97%    12. Acute on chronic respiratory failure. Status post tracheostomy tube. Presently with a #6 Shiley flexible cuffless trach as of 11/02/2019 followed by pulmonary services Dr. 12/8  -pt decannulated and tolerating well  -occlusive dressing/pressure to stoma site 13. Hyperlipidemia. Lipitor 14. Urinary retention. ongoing large volume  retention    -doxasosin  2mg ---increased to 4mg  9/30--will increase to 6mg  tonight 10/8   -continue I/o cath prn to keep volumes less than 500cc   -increased urecholine to 50mg  tid 10/4- will not increase further at this point. Pt apparently with retention prior to this admit.  15. Nausea/vomiting   -improved. Suspect that it was d/t constipation  -tolerating boluses now--titrate up slowly per ID 16. Enterococcus UTI:  10/8: afebrile   -CXR, cbc unremarkable -   -began empiric keflex 10/6, ucx sensitivities pending     LOS: 20 days A FACE TO FACE EVALUATION WAS PERFORMED  12/8 12/11/2019, 1:03 PM

## 2019-12-12 ENCOUNTER — Inpatient Hospital Stay (HOSPITAL_COMMUNITY): Payer: BC Managed Care – PPO | Admitting: Physical Therapy

## 2019-12-12 ENCOUNTER — Inpatient Hospital Stay (HOSPITAL_COMMUNITY): Payer: BC Managed Care – PPO

## 2019-12-12 ENCOUNTER — Inpatient Hospital Stay (HOSPITAL_COMMUNITY): Payer: BC Managed Care – PPO | Admitting: Speech Pathology

## 2019-12-12 LAB — GLUCOSE, CAPILLARY
Glucose-Capillary: 102 mg/dL — ABNORMAL HIGH (ref 70–99)
Glucose-Capillary: 132 mg/dL — ABNORMAL HIGH (ref 70–99)
Glucose-Capillary: 147 mg/dL — ABNORMAL HIGH (ref 70–99)
Glucose-Capillary: 159 mg/dL — ABNORMAL HIGH (ref 70–99)
Glucose-Capillary: 174 mg/dL — ABNORMAL HIGH (ref 70–99)
Glucose-Capillary: 174 mg/dL — ABNORMAL HIGH (ref 70–99)

## 2019-12-12 LAB — BASIC METABOLIC PANEL
Anion gap: 10 (ref 5–15)
BUN: 35 mg/dL — ABNORMAL HIGH (ref 6–20)
CO2: 27 mmol/L (ref 22–32)
Calcium: 8.4 mg/dL — ABNORMAL LOW (ref 8.9–10.3)
Chloride: 100 mmol/L (ref 98–111)
Creatinine, Ser: 1.27 mg/dL — ABNORMAL HIGH (ref 0.61–1.24)
GFR, Estimated: >60 mL/min (ref >60)
Glucose, Bld: 108 mg/dL — ABNORMAL HIGH (ref 70–99)
Potassium: 3.1 mmol/L — ABNORMAL LOW (ref 3.5–5.1)
Sodium: 137 mmol/L (ref 135–145)

## 2019-12-12 LAB — URINE CULTURE: Culture: >=100000 — AB

## 2019-12-12 MED ORDER — POTASSIUM CHLORIDE 20 MEQ/15ML (10%) PO SOLN
40.0000 meq | Freq: Two times a day (BID) | ORAL | Status: AC
  Administered 2019-12-12 – 2019-12-13 (×2): 40 meq
  Filled 2019-12-12 (×2): qty 30

## 2019-12-12 MED ORDER — POTASSIUM CHLORIDE CRYS ER 20 MEQ PO TBCR: 40.0000 meq | EXTENDED_RELEASE_TABLET | Freq: Two times a day (BID) | ORAL | Status: DC

## 2019-12-12 NOTE — Progress Notes (Signed)
Patient is restless,appears anxious,bed alarming found patient with his legs over side railing,denies pain or respiratory discomfort, HOB elevated, skin w/d to touch,coloration adequate, room temperature lower, Xanax administered, Bed alarm increased and bed mat in place, montored

## 2019-12-12 NOTE — Progress Notes (Addendum)
Physical Therapy Session Note  Patient Details  Name: Corey Bennett MRN: [id number] Date of Birth: 1960/08/03  Today's Date: 12/12/2019 PT Individual Time: 5009-3818 PT Individual Time Calculation (min): 47 min   Short Term Goals: Week 3:  PT Short Term Goal 1 (Week 3): STG = LTG due to estimated d/c date.  Skilled Therapeutic Interventions/Progress Updates:  Pt missed first few minutes of session 2/2 nursing care (cathing pt). Pt received in bed & agreeable to tx. Supine>sit with supervision, extra time, HOB significantly elevated, and bed rails with PT Providing max cuing but poor demo from pt for attention to RUE during movement. PT doffs R hand splint & provides max assist for donning paper scrub pants & top, socks, shoes, & R AFO. PT provides cuing for compensatory dressing method but when instructed to place LUE in shirt, pt attempts to lift LLE through shirt instead and ultimately requires PT assist. Pt demonstrates posterior LOB sitting and standing without BUE support with poor awareness & righting reactions. Gait ~7 ft to w/c with RW, R hand orthosis, and R AFO with min assist for linear path, mod assist for turning. PT provides max education & max assist for managing RUE on hand orthosis throughout session. PT also provides cuing for safe hand placement for sit<>stand transfers with poor return demo. Provided pt with new w/c cushion as current one without cover 2/2 recently soiled. Dependent assist w/c transport to/from gym for time management. Sit<>Stand with min assist and pt ambulates 50 ft + 33 ft with RW & mi<>Mod assist with R knee maintained in flexed position but no buckling noted. Pt also initially demonstrates good foot clearance & advancement RLE but does require increasing assistance for advancement as pt fatigues. PT also provides increased weight shifting L during stance phase and cuing for stepping when turning. At end of session pt left in w/c with chair alarm donned, call bell  in reach.   Addendum: At end of session pt left sitting up in w/c & encouraged to sit up until OT session at 11 am.   Therapy Documentation Precautions:  Precautions Precautions: Fall Precaution Comments: PEG, decannulated on 12/04/2019 Restrictions Weight Bearing Restrictions: No   General: PT Amount of Missed Time (min): 13 Minutes PT Missed Treatment Reason: Nursing care   Pain:  No apparent behaviors demonstrating pain, maybe slight discomfort in RUE with repositioning provided throughout session.   Therapy/Group: Individual Therapy  Sandi Mariscal 12/12/2019, 10:52 AM

## 2019-12-12 NOTE — Progress Notes (Signed)
Speech Language Pathology Daily Session Note  Patient Details  Name: Graysin Luczynski MRN: [id number] Date of Birth: [birth year]  Today's Date: 12/12/2019 SLP Individual Time: 0900-0930 SLP Individual Time Calculation (min): 30 min  Short Term Goals: Week 4: SLP Short Term Goal 1 (Week 4): STGs=LTGs due to ELOS  Skilled Therapeutic Interventions:   Patient seen for skilled ST treatment session focusing on swallow function. Patient was asleep but awakened without difficulty however not very verbal or active today. SLP initiated oral care and patient completed with toothette sponge. He started to cough and SLP suctioned small amount of secretions via yaunkers suction. Patient consumed 12 teaspoon sips of nectar thick liquids with maxA cues for chin tuck. No coughing or throat clearing observed until last 3-4 sips, during which patient had non-productive coughing immediately after 2/4 sips of liquids. Continue NPO with nectar trials with SLP only. Patient continues to benefit from skilled SLP intervention to maximize swallow, speech, language and cognitive function prior to discharge.  Pain Pain Assessment Pain Scale: Faces Faces Pain Scale: No hurt  Therapy/Group: Individual Therapy  Pablo Lawrence 12/12/2019, 10:15 AM

## 2019-12-12 NOTE — Progress Notes (Signed)
[place] PHYSICAL MEDICINE & REHABILITATION PROGRESS NOTE   Subjective/Complaints: Pt indicates he's feeling better. Afebrile. Had large BM  Pt reports OK- slept OK- LBM- yesterday- Denies pain.   K+ 3.1- this AM, but Cr much better at 1.27 from 1.50   ROS: limited due to cognition  Objective:   No results found. Recent Labs    12/10/19 0607  WBC 7.5  HGB 9.4*  HCT 28.7*  PLT 217   Recent Labs    12/11/19 1322 12/12/19 0545  NA 141 137  K 3.4* 3.1*  CL 101 100  CO2 29 27  GLUCOSE 153* 108*  BUN 41* 35*  CREATININE 1.50* 1.27*  CALCIUM 8.6* 8.4*    Intake/Output Summary (Last 24 hours) at 12/12/2019 1452 Last data filed at 12/12/2019 0035 Gross per 24 hour  Intake 1979.14 ml  Output 1075 ml  Net 904.14 ml     Physical Exam: Vital Signs Blood pressure (!) 148/66, pulse 72, temperature (!) 97.5 F (36.4 C), temperature source Oral, resp. rate 17, height 6\' 1"  (1.854 m), weight 66.2 kg, SpO2 98 %. Constitutional: No distress . Vital signs reviewed. Laying in bed- beard, slowed processing, NAD HEENT: EOMI, oral membranes moist Neck: supple Cardiovascular: RRRR    Respiratory/Chest: CTA B/L- no W/R/R- good air movement GI/Abdomen: Soft, NT, ND, (+)BS  Ext: no clubbing, cyanosis, or edema Psych: pleasant Neurologic: alert,    aphasic.   motor strength is 5/5 left deltoid, bicep, tricep, grip, hip flexor, knee extensors, ankle dorsiflexor and plantar flexor 3- RIght delt bi, tri, grip.  4-/5 Right HF, KE, ADF. RUE flexor tone still present. Minimal tone in RLE Musculoskeletal: RUE minimally tender with PROM.    Assessment/Plan: 1. Functional deficits secondary to Left MCA infarct  which require 15/7 intensity of interdisciplinary therapy in a comprehensive inpatient rehab setting.  Physiatrist is providing close team supervision and 24 hour management of active medical problems listed below.  Physiatrist and rehab team continue to assess barriers to  discharge/monitor patient progress toward functional and medical goals  Care Tool:  Bathing    Body parts bathed by patient: Chest, Abdomen, Face, Right upper leg, Left upper leg, Right arm, Buttocks, Front perineal area   Body parts bathed by helper: Left arm, Right lower leg, Left lower leg     Bathing assist Assist Level: Moderate Assistance - Patient 50 - 74%     Upper Body Dressing/Undressing Upper body dressing   What is the patient wearing?: Pull over shirt    Upper body assist Assist Level: Moderate Assistance - Patient 50 - 74%    Lower Body Dressing/Undressing Lower body dressing      What is the patient wearing?: Pants, Incontinence brief     Lower body assist Assist for lower body dressing: Moderate Assistance - Patient 50 - 74%     Toileting Toileting Toileting Activity did not occur 17/7 and hygiene only): N/A (no void or bm)  Toileting assist Assist for toileting: Maximal Assistance - Patient 25 - 49%     Transfers Chair/bed transfer  Transfers assist     Chair/bed transfer assist level: Minimal Assistance - Patient > 75%     Locomotion Ambulation   Ambulation assist   Ambulation activity did not occur: Safety/medical concerns  Assist level: Minimal Assistance - Patient > 75% Assistive device: Walker-rolling Max distance: 50 ft   Walk 10 feet activity   Assist  Walk 10 feet activity did not occur: Safety/medical concerns  Assist level: Minimal Assistance - Patient > 75% Assistive device: Walker-rolling   Walk 50 feet activity   Assist Walk 50 feet with 2 turns activity did not occur: Safety/medical concerns  Assist level: Minimal Assistance - Patient > 75% Assistive device: Walker-rolling    Walk 150 feet activity   Assist Walk 150 feet activity did not occur: Safety/medical concerns  Assist level: 2 helpers Assistive device: Walker-rolling    Walk 10 feet on uneven surface  activity   Assist Walk  10 feet on uneven surfaces activity did not occur: Safety/medical concerns         Wheelchair     Assist Will patient use wheelchair at discharge?: Yes Type of Wheelchair: Manual Wheelchair activity did not occur: Safety/medical concerns         Wheelchair 50 feet with 2 turns activity    Assist    Wheelchair 50 feet with 2 turns activity did not occur: Safety/medical concerns       Wheelchair 150 feet activity     Assist  Wheelchair 150 feet activity did not occur: Safety/medical concerns       Blood pressure (!) 148/66, pulse 72, temperature (!) 97.5 F (36.4 C), temperature source Oral, resp. rate 17, height 6\' 1"  (1.854 m), weight 66.2 kg, SpO2 98 %.    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  2. Antithrombotics: -DVT/anticoagulation:SCDs -antiplatelet therapy: Aspirin 81 mg daily and Plavix 75 mg daily 3. Pain Management:Tylenol as needed  -  Gabapentin increased to 200mg  tid for neuropathic pain RUE   -support hemiplegic shoulder  -tone mgt  -pain better controlled 10/7  -10/8 will add low dose baclofen (5mg  bid initially) for RUE flexor tone  10/9- no increased sedaiton so far 4. Mood:Provide emotional support -antipsychotic agents: Seroquel 50 mg nightly 5. Neuropsych: This patientis notcapable of making decisions on hisown behalf.  -continue sleep chart to measure sleep hygiene 6. Skin/Wound Care:Routine skin checks 7. Fluids/Electrolytes/Nutrition:BUN/Cr trending up with recent TF issues, stable today but remain elevated  -begin IVF 1/2ns at 75cc/hr  -recheck BMET 10/9  10/9- K+ 3.1- will rplete 40 mEq x3 and recheck Monday- also Cr much  improved 1.27 was 1.50 8. Seizure disorder. Dilantin 175 mg 3 times daily, Keppra 1000 mg twice daily, Vimpat 150 mg twice daily. outpt neuro f/u  -10/8 corrected dilantin level 22.7, spoke with pharmD and will reduce dilantin to 175mg  tid 9. Diabetes mellitus. Hemoglobin A1c 7.2. Lantus insulin 6 units nightly, NovoLog 2 units every 4 hours.    CBG (last 3)  Recent Labs    12/12/19 0424 12/12/19 0801 12/12/19 1132  GLUCAP 102* 147* 174*   10/8: low cbg's early this morning   -continue increased novolog to 3u scheduled 6x day to coincide with boluses  10/9- BGs better- con't regimen  10. Dysphagia. NPO except for trials with SLP. Status post gastrostomy tube 11/10/2019 per interventional radiology. advance per SLP.  -resumed boluses 10/7 11. Hypertension. Hydralazine 50 mg every 8 hours, Cardura 4 mg daily, Norvasc 10 mg daily.    -10/8 bordereline control   -increase cardura to 6mg   10/9- wait to increase Meds- only increased yesterday- is 140s/70s Vitals:   12/12/19 0528 12/12/19 1233  BP: (!) 148/72 (!) 148/66  Pulse: 70 72  Resp: 19  17  Temp:  (!) 97.5 F (36.4 C)  SpO2: 100% 98%    12. Acute on chronic respiratory failure. Status post tracheostomy tube. Presently with a #6 Shiley flexible cuffless trach as of 11/02/2019 followed by pulmonary services Dr. Levon Hedger  -pt decannulated and tolerating well  -occlusive dressing/pressure to stoma site 13. Hyperlipidemia. Lipitor 14. Urinary retention. ongoing large volume retention    -doxasosin  2mg ---increased to 4mg  9/30--will increase to 6mg  tonight 10/8   -continue I/o cath prn to keep volumes less than 500cc   -increased urecholine to 50mg  tid 10/4- will not increase further at this point. Pt apparently with retention prior to this admit.  15. Nausea/vomiting   -improved. Suspect that it was d/t constipation  -tolerating boluses now--titrate up slowly per ID 16. Enterococcus UTI:  10/8:  afebrile   -CXR, cbc unremarkable -   -began empiric keflex 10/6, ucx sensitivities pending  10/9- Vanc resistant enterococcus- is on Keflex- afebrile- will check with pharmacy if ok to leave on Keflex.      LOS: 21 days A FACE TO FACE EVALUATION WAS PERFORMED  Olajuwon Fosdick 12/12/2019, 2:52 PM

## 2019-12-12 NOTE — Progress Notes (Signed)
Occupational Therapy Session Note  Patient Details  Name: Corey Bennett MRN: [id number] Date of Birth: [birth year]  Today's Date: 12/12/2019 OT Individual Time: 8088-1103 OT Individual Time Calculation (min): 30 min  and Today's Date: 12/12/2019 OT Missed Time: 30 Minutes Missed Time Reason: Patient fatigue   Short Term Goals: Week 3:  OT Short Term Goal 1 (Week 3): STGs = LTGs  Skilled Therapeutic Interventions/Progress Updates:    1:1. Pt received in w/c with RN delivering bolus feed. Pt reluctantly agreeable to OT nodding in agreement to 30 min session. Pt visibly fatigued. Pt completes seated (for RUE WB into arm rest for deep proprioceptive input) and standing (MIN A for balance; post lean, min VC for terminal knee ext on R with no AD) bean bag toss to improve sitting and standing balance required for BADLs and functional transfers. Pt requires rest breaks and encouragement to participate. Pt returns to room transfers back to bed with MIN A and call light in reach/all needs met, exit alarm on  Therapy Documentation Precautions:  Precautions Precautions: Fall Precaution Comments: PEG, decannulated on 12/04/2019 Restrictions Weight Bearing Restrictions: No General: General OT Amount of Missed Time: 30 Minutes PT Missed Treatment Reason: Nursing care Vital Signs:  Pain: Pain Assessment Pain Scale: 0-10 Pain Score: 0-No pain Faces Pain Scale: No hurt ADL: ADL Eating: NPO Grooming: Minimal assistance Where Assessed-Grooming: Sitting at sink Upper Body Bathing: Moderate assistance Where Assessed-Upper Body Bathing: Sitting at sink Lower Body Bathing: Moderate assistance Where Assessed-Lower Body Bathing: Sitting at sink Upper Body Dressing: Moderate assistance Where Assessed-Upper Body Dressing: Sitting at sink Lower Body Dressing: Maximal assistance Where Assessed-Lower Body Dressing: Sitting at sink Toileting: Maximal assistance Where Assessed-Toileting: Bedside  Commode Toilet Transfer: Moderate assistance Toilet Transfer Method: Stand pivot Toilet Transfer Equipment: Art gallery manager    Praxis   Exercises:   Other Treatments:     Therapy/Group: Individual Therapy  Tonny Branch 12/12/2019, 11:44 AM

## 2019-12-13 ENCOUNTER — Inpatient Hospital Stay (HOSPITAL_COMMUNITY): Payer: BC Managed Care – PPO

## 2019-12-13 ENCOUNTER — Inpatient Hospital Stay (HOSPITAL_COMMUNITY): Payer: BC Managed Care – PPO | Admitting: Speech Pathology

## 2019-12-13 LAB — GLUCOSE, CAPILLARY
Glucose-Capillary: 114 mg/dL — ABNORMAL HIGH (ref 70–99)
Glucose-Capillary: 119 mg/dL — ABNORMAL HIGH (ref 70–99)
Glucose-Capillary: 142 mg/dL — ABNORMAL HIGH (ref 70–99)
Glucose-Capillary: 166 mg/dL — ABNORMAL HIGH (ref 70–99)
Glucose-Capillary: 176 mg/dL — ABNORMAL HIGH (ref 70–99)
Glucose-Capillary: 193 mg/dL — ABNORMAL HIGH (ref 70–99)
Glucose-Capillary: 198 mg/dL — ABNORMAL HIGH (ref 70–99)

## 2019-12-13 IMAGING — DX DG CHEST 1V PORT
1 series · 1 of 1 positions shown · non-contrast
Comparison: Chest radiograph dated [DATE].

CLINICAL DATA: 59-year-old male with acute respiratory distress.

EXAM:
PORTABLE CHEST 1 VIEW

[chest]
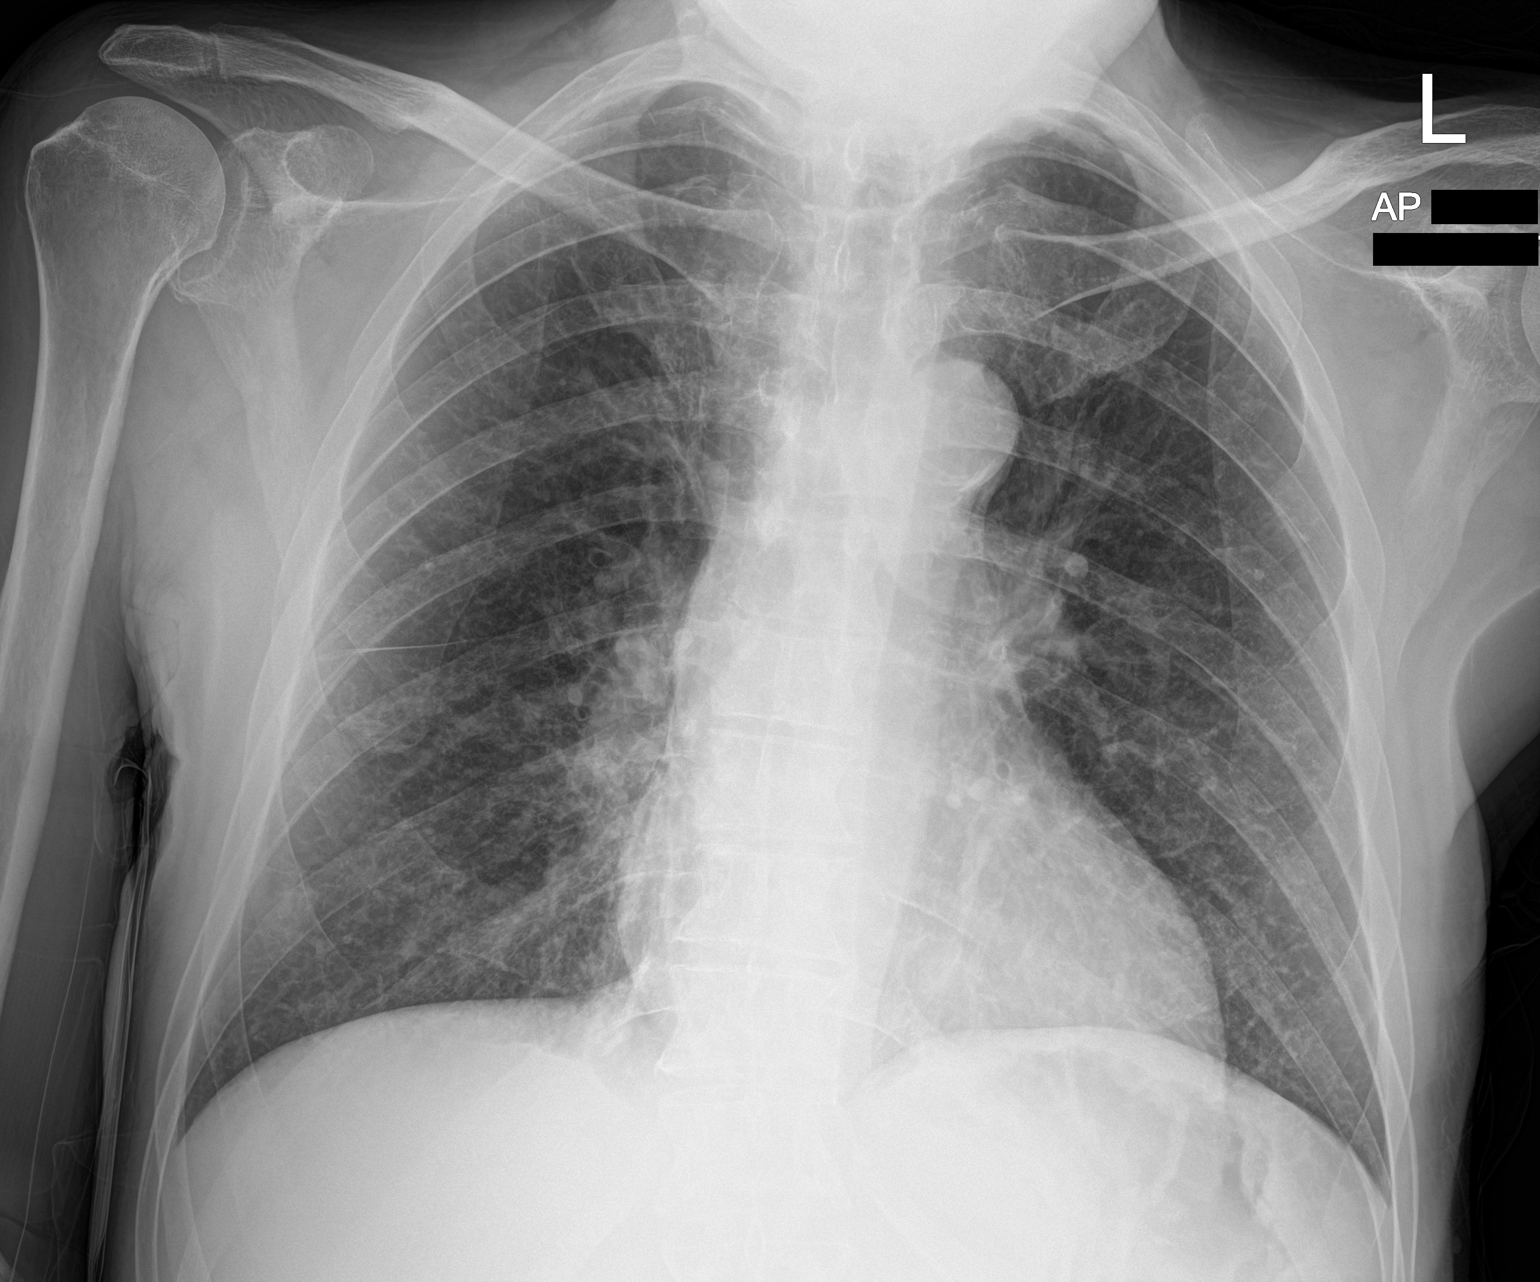

[1 of 1 positions shown; findings below may reference images not displayed]

FINDINGS: Mild diffuse interstitial coarsening may be chronic or mild edema.
No focal consolidation, pleural effusion, pneumothorax. The cardiac
silhouette is within limits. Atherosclerotic calcification of the
aortic arch. No acute osseous pathology.
IMPRESSION: No focal consolidation.

## 2019-12-13 MED ORDER — METOCLOPRAMIDE HCL 5 MG PO TABS
5.0000 mg | ORAL_TABLET | Freq: Three times a day (TID) | ORAL | Status: DC
  Administered 2019-12-13 – 2019-12-15 (×7): 5 mg
  Filled 2019-12-13 (×7): qty 1

## 2019-12-13 MED ORDER — AMOXICILLIN 250 MG PO CAPS
500.0000 mg | ORAL_CAPSULE | Freq: Three times a day (TID) | ORAL | Status: DC
  Administered 2019-12-13 – 2019-12-15 (×6): 500 mg via ORAL
  Filled 2019-12-13 (×6): qty 2

## 2019-12-13 MED ORDER — PHENYTOIN 125 MG/5ML PO SUSP
175.0000 mg | Freq: Every day | ORAL | Status: DC
  Administered 2019-12-15: 175 mg
  Filled 2019-12-13 (×2): qty 8

## 2019-12-13 MED ORDER — PHENYTOIN 125 MG/5ML PO SUSP
200.0000 mg | Freq: Two times a day (BID) | ORAL | Status: DC
  Administered 2019-12-14 – 2019-12-15 (×3): 200 mg
  Filled 2019-12-13 (×4): qty 8

## 2019-12-13 NOTE — Progress Notes (Signed)
Occupational Therapy Session Note  Patient Details  Name: Karell Tukes MRN: [id number] Date of Birth: 02/13/1961  Today's Date: 12/13/2019 OT Individual Time: 1000-1009 OT Individual Time Calculation (min): 9 min  and Today's Date: 12/13/2019 OT Missed Time: 35 Minutes Missed Time Reason: Patient fatigue   Short Term Goals: Week 3:  OT Short Term Goal 1 (Week 3): STGs = LTGs  Skilled Therapeutic Interventions/Progress Updates:    Pt received supine, deeply sleeping. Pt incredibly hard to arouse despite multimodal cueing. Vitals obtained- BP: 151/65, HR 71. SpO2 100%. Eventually was able to arouse pt and he shook head no to participation in session. RN notified of drowsiness. 35 min of skilled OT missed- will attempt to makeup time as able.   Therapy Documentation Precautions:  Precautions Precautions: Fall Precaution Comments: PEG, decannulated on 12/04/2019 Restrictions Weight Bearing Restrictions: No  Therapy/Group: Individual Therapy  Crissie Reese 12/13/2019, 6:45 AM

## 2019-12-13 NOTE — Significant Event (Signed)
Rapid Response Event Note   Reason for Call :  Patient found unresponsive  Initial Focused Assessment:  Called because the patient hit his soft bumper call bell, which is unlike him, RN responded, and found him unresponsive.  The patient required sternal rubbing.  She said that he had a "blank stare" and was not communicating.  When I got to the room the patient was appeared more himself.  He was following commands, thumbs up, smiling, tongue out, wiggling toes.  He was tracking me visually.  Patient has a history of CVA and seizures.  According to his MD, his dilantin dose were reduced on 10/8.  Patient is also taking vimpat and keppra for his seizure management as well as PRN xanax.    Patient is pretty congested on auscultation.  Vital signs stable.  Patient does have bilateral nystagmus.     Interventions:  NT suctioned, vitals taken, POC discussed with Dr. Berline Chough.  Plan of Care:  MD will check back in one hour.  If patient is not back to baseline, we will get head CT to rule out new CVA.  Q2 hour neuro checks ordered as well as Q2 vitals.   Event Summary:   MD Notified: Rehab MD Call Time: 1618 Arrival Time: 1620 End Time:1700  Andrey Spearman, RN

## 2019-12-13 NOTE — Progress Notes (Signed)
Called- pt found unresponsive and not breathing ~5-10 minutes after was actively communicating with RN.   Required 5 or so sternal rubs, and started breathing, better color.  Per rapid response, appears post ictal.  Baclofen was added 10/8 5 mg BID- but that can lower seizure threshold AND Dilantin was decreased on 10/8 as well.   So, will stop Baclofen for now- can always restart if later thought to not be an issue- and increase dilantin back to previous dose of 200/175/200 mg- placed order myself.  Also, nursing to give his prn dose of Xanax since likely had seizure and will call back in 1 hr and have q1 hour neuro checks on him- will wait ot transfer because after wasn't breathing, vitals actually came back GREAT- BP 140s/70s, pulse 70s, sats 100%- a little congested- Rapid response, Luisa Hart said would N/T suction him to help- might have aspirated- also held TFs and also held IVFs that were running.   Will check head CT if progresses/worsens - right now BACK to BASELINE that has been at for last few days-  And will wait to transfer him off the unit.

## 2019-12-13 NOTE — Progress Notes (Signed)
[place] PHYSICAL MEDICINE & REHABILITATION PROGRESS NOTE   Subjective/Complaints:  Pt very sleepy Per pharmacy, can switch Keflex to Amoxicillin-VRE UTI is sensitive- will do a total of 7 days- has had 4 days of Keflex.  Got xanax last night- might be why so sedated this AM.   Needs labs tomorrow to f/u on low K+, Cr/BUN and CBC  ROS: limited due to cognition and sedation  Objective:   No results found. No results for input(s): WBC, HGB, HCT, PLT in the last 72 hours. Recent Labs    12/11/19 1322 12/12/19 0545  NA 141 137  K 3.4* 3.1*  CL 101 100  CO2 29 27  GLUCOSE 153* 108*  BUN 41* 35*  CREATININE 1.50* 1.27*  CALCIUM 8.6* 8.4*    Intake/Output Summary (Last 24 hours) at 12/13/2019 1457 Last data filed at 12/13/2019 0658 Gross per 24 hour  Intake 1824 ml  Output --  Net 1824 ml     Physical Exam: Vital Signs Blood pressure (!) 164/102, pulse 76, temperature 97.9 F (36.6 C), resp. rate 17, height 6\' 1"  (1.854 m), weight 71.1 kg, SpO2 98 %. Constitutional: pt asleep- woke briefly to stimuli, but not long, NAD HEENT: EOMI, oral membranes moist Neck: supple Cardiovascular: RRR Respiratory/Chest: CTA B/L- no W/R/R- good air movement GI/Abdomen: Soft, NT, ND, (+)BS  Ext: no clubbing, cyanosis, or edema Psych: pleasant but sedated/sleepy Neurologic: alert,    aphasic.   motor strength is 5/5 left deltoid, bicep, tricep, grip, hip flexor, knee extensors, ankle dorsiflexor and plantar flexor 3- RIght delt bi, tri, grip.  4-/5 Right HF, KE, ADF. RUE flexor tone still present. Minimal tone in RLE Musculoskeletal: RUE minimally tender with PROM.    Assessment/Plan: 1. Functional deficits secondary to Left MCA infarct  which require 15/7 intensity of interdisciplinary therapy in a comprehensive inpatient rehab setting.  Physiatrist is providing close team supervision and 24 hour management of active medical problems listed below.  Physiatrist and rehab team  continue to assess barriers to discharge/monitor patient progress toward functional and medical goals  Care Tool:  Bathing    Body parts bathed by patient: Chest, Abdomen, Face, Right upper leg, Left upper leg, Right arm, Buttocks, Front perineal area   Body parts bathed by helper: Left arm, Right lower leg, Left lower leg     Bathing assist Assist Level: Moderate Assistance - Patient 50 - 74%     Upper Body Dressing/Undressing Upper body dressing   What is the patient wearing?: Pull over shirt    Upper body assist Assist Level: Moderate Assistance - Patient 50 - 74%    Lower Body Dressing/Undressing Lower body dressing      What is the patient wearing?: Pants, Incontinence brief     Lower body assist Assist for lower body dressing: Moderate Assistance - Patient 50 - 74%     Toileting Toileting Toileting Activity did not occur 17/7 and hygiene only): N/A (no void or bm)  Toileting assist Assist for toileting: Maximal Assistance - Patient 25 - 49%     Transfers Chair/bed transfer  Transfers assist     Chair/bed transfer assist level: Minimal Assistance - Patient > 75%     Locomotion Ambulation   Ambulation assist   Ambulation activity did not occur: Safety/medical concerns  Assist level: Minimal Assistance - Patient > 75% Assistive device: Walker-rolling Max distance: 50 ft   Walk 10 feet activity   Assist  Walk 10 feet activity did not occur:  Safety/medical concerns  Assist level: Minimal Assistance - Patient > 75% Assistive device: Walker-rolling   Walk 50 feet activity   Assist Walk 50 feet with 2 turns activity did not occur: Safety/medical concerns  Assist level: Minimal Assistance - Patient > 75% Assistive device: Walker-rolling    Walk 150 feet activity   Assist Walk 150 feet activity did not occur: Safety/medical concerns  Assist level: 2 helpers Assistive device: Walker-rolling    Walk 10 feet on uneven  surface  activity   Assist Walk 10 feet on uneven surfaces activity did not occur: Safety/medical concerns         Wheelchair     Assist Will patient use wheelchair at discharge?: Yes Type of Wheelchair: Manual Wheelchair activity did not occur: Safety/medical concerns         Wheelchair 50 feet with 2 turns activity    Assist    Wheelchair 50 feet with 2 turns activity did not occur: Safety/medical concerns       Wheelchair 150 feet activity     Assist  Wheelchair 150 feet activity did not occur: Safety/medical concerns       Blood pressure (!) 164/102, pulse 76, temperature 97.9 F (36.6 C), resp. rate 17, height 6\' 1"  (1.854 m), weight 71.1 kg, SpO2 98 %.    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  2. Antithrombotics: -DVT/anticoagulation:SCDs -antiplatelet therapy: Aspirin 81 mg daily and Plavix 75 mg daily 3. Pain Management:Tylenol as needed  -  Gabapentin increased to 200mg  tid for neuropathic pain RUE   -support hemiplegic shoulder  -tone mgt  -pain better controlled 10/7  -10/8 will add low dose baclofen (5mg  bid initially) for RUE flexor tone  10/9- no increased sedaiton so far  101/0- asleep this AM, but could have been o/n Xanax- woke up per staff 4. Mood:Provide emotional support -antipsychotic agents: Seroquel 50 mg nightly 5. Neuropsych: This patientis notcapable of making decisions on hisown behalf.  -continue sleep chart to measure sleep hygiene 6. Skin/Wound Care:Routine skin checks 7. Fluids/Electrolytes/Nutrition:BUN/Cr trending up with recent TF issues, stable today but remain elevated  -begin IVF 1/2ns at 75cc/hr  -recheck  BMET 10/9  10/9- K+ 3.1- will rplete 40 mEq x3 and recheck Monday- also Cr much improved 1.27 was 1.50  10/10- labs in AM 8. Seizure disorder. Dilantin 175 mg 3 times daily, Keppra 1000 mg twice daily, Vimpat 150 mg twice daily. outpt neuro f/u  -10/8 corrected dilantin level 22.7, spoke with pharmD and will reduce dilantin to 175mg  tid 9. Diabetes mellitus. Hemoglobin A1c 7.2. Lantus insulin 6 units nightly, NovoLog 2 units every 4 hours.    CBG (last 3)  Recent Labs    12/13/19 0419 12/13/19 0804 12/13/19 1133  GLUCAP 114* 142* 119*   10/8: low cbg's early this morning   -continue increased novolog to 3u scheduled 6x day to coincide with boluses  10/10- BGs controlled con't regimen  10. Dysphagia. NPO except for trials with SLP. Status post gastrostomy tube 11/10/2019 per interventional radiology. advance per SLP.  -resumed boluses 10/7 11. Hypertension. Hydralazine 50 mg every 8 hours, Cardura 4 mg daily, Norvasc 10 mg daily.    -10/8 bordereline control   -increase cardura to 6mg   10/9- wait to increase Meds- only increased yesterday- is 140s/70s  10/10- BP 164/102-if still elevate din AM, suggest increase hydralazine  Vitals:   12/13/19 0416 12/13/19 1452  BP: (!) 158/74 (!) 164/102  Pulse: 73 76  Resp: 18 17  Temp: (!) 97.5 F (36.4 C) 97.9 F (36.6 C)  SpO2: 97% 98%    12. Acute on chronic respiratory failure. Status post tracheostomy tube. Presently with a #6 Shiley flexible cuffless trach as of 11/02/2019 followed by pulmonary services Dr. Levon Hedger  -pt decannulated and tolerating well  -occlusive dressing/pressure to stoma site 13. Hyperlipidemia. Lipitor 14. Urinary retention. ongoing large volume retention    -doxasosin  2mg ---increased to 4mg  9/30--will increase to 6mg  tonight 10/8   -continue I/o cath prn to keep volumes less than 500cc   -increased urecholine to 50mg  tid 10/4- will not increase further at this point. Pt apparently with  retention prior to this admit.  15. Nausea/vomiting   -improved. Suspect that it was d/t constipation  -tolerating boluses now--titrate up slowly per ID 16. Enterococcus UTI:  10/8: afebrile   -CXR, cbc unremarkable -   -began empiric keflex 10/6, ucx sensitivities pending  10/9- Vanc resistant enterococcus- is on Keflex- afebrile- will check with pharmacy if ok to leave on Keflex.    10/10- they want/pharmacy to switch to Amoxicillin- did so will do 3 more days- had 4 days so far of Keflex.   LOS: 22 days A FACE TO FACE EVALUATION WAS PERFORMED  Shayli Altemose 12/13/2019, 2:57 PM

## 2019-12-13 NOTE — Progress Notes (Signed)
Physical Therapy Session Note  Patient Details  Name: Corey Bennett MRN: [id number] Date of Birth: Jul 23, 1960  Today's Date: 12/13/2019 PT Individual Time: 1050-1200 PT Individual Time Calculation (min): 70 min   Short Term Goals: Week 3:  PT Short Term Goal 1 (Week 3): STG = LTG due to estimated d/c date.  Skilled Therapeutic Interventions/Progress Updates:     Patient in bed asleep upon PT arrival. Patient aroused to verbal and tactile stimulation and agreeable to PT session. Patient denied pain during session. Patient verbalized simple one word responses, mostly "yes/no" throughout session.   Therapeutic Activity: Bed Mobility: Patient performed supine to/from sit with close supervision in a flat bed without use of bed rails. Provided verbal cues for positioning in the bed for safety, patient tends to lay close to the EOB. Patient doffed/donned a shirt with max A with cues for hemi-technique. He donned shorts with max A to thread LEs through his shorts and total A to don socks and shoes with R AFO.  Transfers: Patient performed sit to/from stand x1 with mod A without an AD and sit to/from stand x2 and stand pivot x2 with min A using RW with R hand splint. Provided verbal cues for foot placement, forward weight shift, and knee/hip extension to come to full stand. He performed standing balance with min A for PT to pull up his pants with max A due to decreased balance and don a belt with total A.   Gait Training:  Patient ambulated 25 feet and 23 feet using RW with R hand splint and R AFO with min-mod A for balance, manual facilitation for weight shifting, and min A for R LE advancement x3-4 each trial due to fatigue. Ambulated with R knee flexion throughout gait cycle, step-to with intermittent step-through gait pattern leading with R with cues, but frequently leading with L due to poor recall, had decreased L foot clearance, L weight shift, and decreased initiation of swing on L requiring  increased time or facilitation. Provided verbal cues for erect posture, looking ahead (used a mirror of second trial), sequencing, striking with heel at IC with R LE to promote increased step length, and multimodal cues for R quad activation and knee extension in stance. Patient became significantly fatigued and anxious at end of second trial, unable to communicate cause for anxiety. Required PT to shift w/c behind him to safely return to sitting. Anxiety continued until patient returned to bed, RN made aware. Provided cues for diaphragmatic breathing and relaxation for anxiety management with moderate success.   Patient in bed at end of session with breaks locked, bed alarm set, and all needs within reach.    Therapy Documentation Precautions:  Precautions Precautions: Fall Precaution Comments: PEG, decannulated on 12/04/2019 Restrictions Weight Bearing Restrictions: No   Therapy/Group: Individual Therapy  Pierson Vantol L Abagael Kramm PT, DPT  12/13/2019, 4:47 PM

## 2019-12-13 NOTE — Progress Notes (Signed)
Alert with neuro intact,coloration adequate, denies pain, has non-productive cough, oral suction,with no oral returns at this time. Incontinent of urine and stool, incontinent care provided. Repositioned and turned. Refused SCD's and hand splint and removed, explain purpose of equipment, Nod that he understands, reused neb treatment, repositioned and turned. Call bell placed on left side and within reach, bed alarm on.. Monitored

## 2019-12-13 NOTE — Progress Notes (Signed)
Walk in patient room at 1605, patient was staring and would not respond to my command. He would not blink and was not breathing. Code Blue was initiated. After a few sternal rubs, the patient gasp with a breath and began to breathe normal. Rapid Response was called and Dr. Berline Chough was called. Patients vitals are stable and following commands. Neuro checks and vitals Q2 hour.

## 2019-12-13 NOTE — Discharge Summary (Addendum)
Physician Discharge Summary  Patient ID: Corey Bennett MRN: [id number] DOB/AGE: [birth year] 59 y.o.  Admit date: 11/21/2019 Discharge date: 12/15/2019  Discharge Diagnoses:  Principal Problem:   Left middle cerebral artery stroke [facility]) Active Problems:   Malnutrition of moderate degree   Tracheostomy dependence (HCC) DVT prophylaxis Pain management Dysphagia Hypertension Hyperlipidemia Acute on chronic respiratory failure status post tracheostomy Urinary retention Constipation Enterococcus UTI Diabetes mellitus  Discharged Condition: Stable  Significant Diagnostic Studies: DG Chest 2 View  Result Date: 12/09/2019 CLINICAL DATA:  CVA with concern for potential aspiration EXAM: CHEST - 2 VIEW COMPARISON:  November 13, 2019 FINDINGS: Tracheostomy no longer appreciable. Lungs are clear. Heart size and pulmonary vascularity are normal. No adenopathy. No bone lesions. IMPRESSION: Lungs clear.  Cardiac silhouette normal. Electronically Signed   By: Bretta Bang III M.D.   On: 12/09/2019 08:32   DG Abd 1 View  Result Date: 12/09/2019 CLINICAL DATA:  Nausea and vomiting. EXAM: ABDOMEN - 1 VIEW COMPARISON:  11/11/2019 FINDINGS: There is no evidence for gaseous bowel dilation to suggest obstruction. Gastrostomy tube again noted. Gas in the medial right upper quadrant is likely in the distal stomach/duodenum, but free air under the liver on a supine film could have this appearance. Prominent stool volume. Visualized bony anatomy unremarkable. IMPRESSION: 1. Gas in the medial right upper quadrant likely in the distal stomach/duodenum although free air under the liver on a supine film could have this appearance. CT scan of the abdomen and pelvis could be performed to further evaluate as clinically warranted. 2. Prominent left colonic stool volume. Electronically Signed   By: Kennith Center M.D.   On: 12/09/2019 06:29   DG Chest Port 1 View  Result Date: 12/13/2019 CLINICAL DATA:  59 year old  male with acute respiratory distress. EXAM: PORTABLE CHEST 1 VIEW COMPARISON:  Chest radiograph dated 12/09/2019. FINDINGS: Mild diffuse interstitial coarsening may be chronic or mild edema. No focal consolidation, pleural effusion, pneumothorax. The cardiac silhouette is within limits. Atherosclerotic calcification of the aortic arch. No acute osseous pathology. IMPRESSION: No focal consolidation. Electronically Signed   By: Elgie Collard M.D.   On: 12/13/2019 18:07    Labs:  Basic Metabolic Panel: Recent Labs  Lab 12/09/19 0758 12/11/19 1322 12/12/19 0545 12/14/19 0643  NA 144 141 137 137  K 3.6 3.4* 3.1* 3.4*  CL 103 101 100 101  CO2 28 29 27 26   GLUCOSE 175* 153* 108* 100*  BUN 41* 41* 35* 25*  CREATININE 1.75* 1.50* 1.27* 1.14  CALCIUM 9.1 8.6* 8.4* 8.5*    CBC: Recent Labs  Lab 12/09/19 0758 12/10/19 0607  WBC 11.5* 7.5  HGB 8.9* 9.4*  HCT 27.7* 28.7*  MCV 93.3 94.1  PLT 220 217    CBG: Recent Labs  Lab 12/14/19 1550 12/14/19 1838 12/14/19 2030 12/14/19 2358 12/15/19 0426  GLUCAP 109* 148* 166* 173* 111*    Brief HPI:   Corey Bennett is a 59 y.o. right-handed male with history of diabetes mellitus hypertension hyperlipidemia left MCA infarction April 2021 while living in May 2021 underwent rehabilitation at [facility] with residual aphasia and right-sided weakness as well as left CEA July 2021 who resides presently assisted living facility.  Presented 10/10/2019 with seizure-like activity requiring intubation.  CT and MRI showed large territory chronic hemorrhage infarct in the left frontal lobe.  Ill-defined hyperintensities throughout the left basal ganglia and thalamus.  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  Patient maintained on aspirin and Plavix for CVA prophylaxis.  Keppra Dilantin and Vimpat ordered for seizure disorder.   Patient did initially require intubation extubated 10/14/2019 however continued to have desaturations into the 80s 15 L nonrebreather mask requiring reintubation and tracheostomy performed 10/22/2019 presently with a #6 cuffless trach as of 11/02/2019.  Currently n.p.o. gastrostomy tube placed 11/10/2019 per Dr. Loreta Ave of interventional radiology.  Bouts of urine retention requiring Urecholine.  Therapy evaluations completed and patient was admitted for a comprehensive rehab program   Hospital Course: Corey Bennett was admitted to rehab 11/21/2019 for inpatient therapies to consist of PT, ST and OT at least three hours five days a week. Past admission physiatrist, therapy team and rehab RN have worked together to provide customized collaborative inpatient rehab.  Pertaining to patient's left MCA distribution infarction as well as history of left MCA remained stable he continued on aspirin and Plavix therapy.  Patient would need follow-up neurology services [place]  SCDs for DVT prophylaxis.  Pain managed with use of scheduled baclofen for spasticity as well as Neurontin every 8 hours.  His baclofen was later discontinued due to some sedation.  Seizure prophylaxis with the use of Dilantin, Keppra 1000 mg twice daily and Vimpat 150 mg twice daily.  There was an episode of lethargy noted 12/13/2019 patient did respond to sternal rub there was some question of possible seizure his Dilantin was adjusted.Marland Kitchen  Dysphagia n.p.o. gastrostomy tube 11/10/2019 per interventional radiology patient tolerating bolus tube feeds after adjustments due to some bouts of nausea with constipation and the addition of Reglan.  Blood pressure controlled with Cardura 6 mg daily as well as Norvasc 10 mg daily and hydralazine 50 mg every 8 hours.  Mood stabilization with scheduled Lexapro 5 mg nightly as well as Seroquel 50 mg nightly and Xanax 0.25 mg 3 times daily as needed.  Hospital course bouts of urinary retention  improved he did continue on Urecholine he was also being treated for Enterococcus UTI completing course of Amoxil and remained on in and out catheterizations as needed keep volumes less than 500 cc.  Acute on chronic respiratory failure tracheostomy decannulated tolerating well monitoring oxygen saturations.  Lipitor for hyperlipidemia.  Blood sugars controlled continue on NovoLog 6 times daily as well as scheduled Lantus insulin with hemoglobin A1c 7.2   Blood pressures were monitored on TID basis and soft and monitored  Diabetes has been monitored with ac/hs CBG checks and SSI was use prn for tighter BS control.    Rehab course: During patient's stay in rehab weekly team conferences were held to monitor patient's progress, set goals and discuss barriers to discharge. At admission, patient required min mod assist 90 feet to person hand-held assist moderate assist sit to stand.  Moderate assist upper body bathing mod assist lower body bathing mod assist upper body dressing moderate assist lower body dressing  Physical exam.  Blood pressure 153/64 pulse 84 temperature 98 respirations 17 oxygen saturations 100% room air Constitutional.  No acute distress HEENT Head.  Normocephalic and atraumatic Cardiac regular rate rhythm without extra sounds or murmur heard Neck.  Supple nontender no JVD trach stoma site healing Respiratory effort normal no respiratory distress without wheeze Abdomen.  Soft nontender positive bowel sounds gastrostomy tube in place Neurologic.  Cranial nerves II through XII intact motor strength 3/5 right deltoid bicep tricep grip hip flexor knee extensor ankle dorsi flexion 5/5 in left deltoid bicep tricep  grip hip flexors knee extensors ankle dorsi plantarflexion.  He/  has had improvement in activity tolerance, balance, postural control as well as ability to compensate for deficits. He/ has had improvement in functional use RUE/LUE  and RLE/LLE as well as improvement in  awareness.  Supine to sit with supervision.  Patient doffed right hand splint provides max assist for donning paper scrub pants and top socks shoes and AFO.  Patient provided cueing for compensatory dressing methods.  Demonstrates posterior loss of balance sitting and standing without lateral upper extremity support.  Ambulate 7 feet rolling walker.  He can increase his ambulation up to 50 feet.  Patient demonstrates good foot clearance.  He needs assist for ADLs and monitoring of balance.  [place] out [place] state [place] closer with family       Disposition: Discharged to outside facility    Diet:NPO. Osmolite 237 mL 6 times daily by tube Free water 150 mL every 4 hours per tube  Medications at discharge 1.  Tylenol as needed 2.  Xanax 0.25 mg 3 times daily by tube as needed 3.  Norvasc 10 mg daily by tube 4.  Aspirin 81 mg p.o. daily by tube 5.  Lipitor 80 mg p.o. daily by tube 8.  Urecholine 50 mg 3 times daily by tube 9.  Amoxil 500 mg every 8 hours completing course 10.  Plavix 75 mg daily by tube 11.  Cardura 6 mg daily by tube 12.  Lexapro 5 mg daily nightly by tube 13.  Pepcid 10 mg daily by tube 14.  Neurontin 200 mg every 8 hours by tube 15.  Hydralazine 50 mg every 6 hours by tube 16.  NovoLog 3 units 6 times daily 17.  Lantus insulin 5 units twice daily 18.  Vimpat 150 mg twice daily by tube 19.  Keppra 1000 mg twice daily by tube 20.  Reglan 5 mg every 8 hours by tube 21.  Dilantin 125 mg / 5 mL suspension 15 mL (375mg ) and 8 mL(200mg ) in the evening 22.  Seroquel 50 mg nightly by tube 23.  Scopolamine patch change every 72 hours 24.  Senokot 5 mL twice daily by tube 25.KDUR 10 meq daily per tube  30-35 minutes were spent completing discharge summary and discharge planning  Special Instructions:  Discharge Instructions    Ambulatory referral to Neurology   Complete by: As directed    An appointment is requested in approximately 4 weeks left MCA  distribution infarction       Follow-up Information    , MD Follow up.   Specialty: Physical Medicine and Rehabilitation Why: No follow-up needed as patient is leaving the area on discharge Contact information: [address] [place] [phone]        [phone], MD Follow up.   Specialty: Pulmonary Disease Why: Call for appointment Contact information: [address] [place] [phone]        [phone], DO Follow up.   Specialties: Interventional Radiology, Radiology Why: Call for appointment Contact information: [address] [place] [address]               Signed: [phone] Jatin Naumann 12/15/2019, 5:07 AM

## 2019-12-14 ENCOUNTER — Inpatient Hospital Stay (HOSPITAL_COMMUNITY): Payer: BC Managed Care – PPO

## 2019-12-14 ENCOUNTER — Inpatient Hospital Stay (HOSPITAL_COMMUNITY): Payer: BC Managed Care – PPO | Admitting: Occupational Therapy

## 2019-12-14 ENCOUNTER — Inpatient Hospital Stay (HOSPITAL_COMMUNITY): Payer: BC Managed Care – PPO | Admitting: Speech Pathology

## 2019-12-14 ENCOUNTER — Other Ambulatory Visit (HOSPITAL_COMMUNITY): Payer: Self-pay | Admitting: Physician Assistant

## 2019-12-14 LAB — GLUCOSE, CAPILLARY
Glucose-Capillary: 40 mg/dL — CL (ref 70–99)
Glucose-Capillary: 90 mg/dL (ref 70–99)
Glucose-Capillary: 189 mg/dL — ABNORMAL HIGH (ref 70–99)
Glucose-Capillary: 113 mg/dL — ABNORMAL HIGH (ref 70–99)
Glucose-Capillary: 69 mg/dL — ABNORMAL LOW (ref 70–99)
Glucose-Capillary: 109 mg/dL — ABNORMAL HIGH (ref 70–99)
Glucose-Capillary: 148 mg/dL — ABNORMAL HIGH (ref 70–99)
Glucose-Capillary: 166 mg/dL — ABNORMAL HIGH (ref 70–99)

## 2019-12-14 LAB — BASIC METABOLIC PANEL
Anion gap: 10 (ref 5–15)
BUN: 25 mg/dL — ABNORMAL HIGH (ref 6–20)
CO2: 26 mmol/L (ref 22–32)
Calcium: 8.5 mg/dL — ABNORMAL LOW (ref 8.9–10.3)
Chloride: 101 mmol/L (ref 98–111)
Creatinine, Ser: 1.14 mg/dL (ref 0.61–1.24)
GFR, Estimated: >60 mL/min (ref >60)
Glucose, Bld: 100 mg/dL — ABNORMAL HIGH (ref 70–99)
Potassium: 3.4 mmol/L — ABNORMAL LOW (ref 3.5–5.1)
Sodium: 137 mmol/L (ref 135–145)

## 2019-12-14 MED ORDER — FAMOTIDINE 10 MG PO TABS: 10.0000 mg | ORAL_TABLET | Freq: Every day | ORAL | 0 refills | Status: DC

## 2019-12-14 MED ORDER — GABAPENTIN 250 MG/5ML PO SOLN: 200.0000 mg | Freq: Three times a day (TID) | ORAL | 12 refills | Status: AC

## 2019-12-14 MED ORDER — DOXAZOSIN MESYLATE 2 MG PO TABS: 6.0000 mg | ORAL_TABLET | Freq: Every day | ORAL | 0 refills | Status: DC

## 2019-12-14 MED ORDER — PHENYTOIN 125 MG/5ML PO SUSP: 175.0000 mg | Freq: Every day | ORAL | 12 refills | Status: DC

## 2019-12-14 MED ORDER — BETHANECHOL CHLORIDE 50 MG PO TABS: 50.0000 mg | ORAL_TABLET | Freq: Three times a day (TID) | ORAL | 0 refills | Status: AC

## 2019-12-14 MED ORDER — POTASSIUM CHLORIDE 20 MEQ/15ML (10%) PO SOLN
40.0000 meq | Freq: Once | ORAL | Status: AC
  Administered 2019-12-14: 40 meq via ORAL

## 2019-12-14 MED ORDER — ACETAMINOPHEN 160 MG/5ML PO SOLN: 650.0000 mg | Freq: Four times a day (QID) | ORAL | 0 refills | Status: AC | PRN

## 2019-12-14 MED ORDER — HYDRALAZINE HCL 50 MG PO TABS
50.0000 mg | ORAL_TABLET | Freq: Four times a day (QID) | ORAL | Status: DC
  Administered 2019-12-14 – 2019-12-15 (×5): 50 mg
  Filled 2019-12-14 (×5): qty 1

## 2019-12-14 MED ORDER — HYDRALAZINE HCL 50 MG PO TABS: 50.0000 mg | ORAL_TABLET | Freq: Four times a day (QID) | ORAL | 0 refills | Status: DC

## 2019-12-14 MED ORDER — PHENYTOIN 125 MG/5ML PO SUSP: ORAL | 1 refills | Status: DC

## 2019-12-14 MED ORDER — GABAPENTIN 100 MG PO CAPS: ORAL_CAPSULE | ORAL | 0 refills | Status: DC

## 2019-12-14 MED ORDER — INSULIN ASPART 100 UNIT/ML ~~LOC~~ SOLN: 3.0000 [IU] | Freq: Every day | SUBCUTANEOUS | 11 refills | Status: DC

## 2019-12-14 MED ORDER — AMLODIPINE BESYLATE 10 MG PO TABS: 10.0000 mg | ORAL_TABLET | Freq: Every day | ORAL | 0 refills | Status: DC

## 2019-12-14 MED ORDER — PHENYTOIN 125 MG/5ML PO SUSP: 200.0000 mg | Freq: Two times a day (BID) | ORAL | 12 refills | Status: DC

## 2019-12-14 MED ORDER — HYDRALAZINE HCL 50 MG PO TABS: 50.0000 mg | ORAL_TABLET | Freq: Three times a day (TID) | ORAL | 0 refills | Status: DC

## 2019-12-14 MED ORDER — SENNOSIDES 8.8 MG/5ML PO SYRP: 5.0000 mL | ORAL_SOLUTION | Freq: Two times a day (BID) | ORAL | 0 refills | Status: AC

## 2019-12-14 MED ORDER — OSMOLITE 1.5 CAL PO LIQD: 237.0000 mL | Freq: Every day | ORAL | 0 refills | Status: AC

## 2019-12-14 MED ORDER — ASPIRIN 81 MG PO CHEW: 81.0000 mg | CHEWABLE_TABLET | Freq: Every day | ORAL | Status: AC

## 2019-12-14 MED ORDER — SCOPOLAMINE 1 MG/3DAYS TD PT72: 1.0000 | MEDICATED_PATCH | TRANSDERMAL | 12 refills | Status: DC

## 2019-12-14 MED ORDER — LEVETIRACETAM 100 MG/ML PO SOLN: 1000.0000 mg | Freq: Two times a day (BID) | ORAL | 12 refills | Status: DC

## 2019-12-14 MED ORDER — CLOPIDOGREL BISULFATE 75 MG PO TABS: 75.0000 mg | ORAL_TABLET | Freq: Every day | ORAL | 0 refills | Status: DC

## 2019-12-14 MED ORDER — QUETIAPINE FUMARATE 50 MG PO TABS: 50.0000 mg | ORAL_TABLET | Freq: Every day | ORAL | 0 refills | Status: DC

## 2019-12-14 MED ORDER — LACOSAMIDE 150 MG PO TABS: 150.0000 mg | ORAL_TABLET | Freq: Two times a day (BID) | ORAL | 0 refills | Status: DC

## 2019-12-14 MED ORDER — ATORVASTATIN CALCIUM 80 MG PO TABS: 80.0000 mg | ORAL_TABLET | Freq: Every day | ORAL | 0 refills | Status: DC

## 2019-12-14 MED ORDER — FREE WATER: 150.0000 mL | Status: AC

## 2019-12-14 MED ORDER — INSULIN GLARGINE 100 UNIT/ML ~~LOC~~ SOLN: 5.0000 [IU] | Freq: Two times a day (BID) | SUBCUTANEOUS | 11 refills | Status: DC

## 2019-12-14 MED ORDER — ESCITALOPRAM OXALATE 5 MG PO TABS: 5.0000 mg | ORAL_TABLET | Freq: Every day | ORAL | 0 refills | Status: DC

## 2019-12-14 MED ORDER — ALPRAZOLAM 0.25 MG PO TABS: 0.2500 mg | ORAL_TABLET | Freq: Three times a day (TID) | ORAL | 0 refills | Status: DC | PRN

## 2019-12-14 MED ORDER — AMOXICILLIN 500 MG PO CAPS: 500.0000 mg | ORAL_CAPSULE | Freq: Three times a day (TID) | ORAL | 9 refills | Status: DC

## 2019-12-14 MED ORDER — METOCLOPRAMIDE HCL 5 MG PO TABS: 5.0000 mg | ORAL_TABLET | Freq: Three times a day (TID) | ORAL | 0 refills | Status: DC

## 2019-12-14 MED FILL — QUETIAPINE FUMARATE 50 MG T: 50 | 30 days supply | Qty: 30 | Fill #0

## 2019-12-14 MED FILL — ALPRAZolam 0.25 MG TABS: 0.25 | 10 days supply | Qty: 30 | Fill #0

## 2019-12-14 MED FILL — HEARTBURN RELIEF TABLET: 10 | 30 days supply | Qty: 30 | Fill #0

## 2019-12-14 MED FILL — BETHANECHOL 25 MG TABLET: 25 | 7 days supply | Qty: 42 | Fill #0

## 2019-12-14 MED FILL — METOCLOPRAMIDE 5 MG TABLET: 5 | 30 days supply | Qty: 90 | Fill #0

## 2019-12-14 MED FILL — AMOXICILLIN 500 MG CAPS: 500 | 3 days supply | Qty: 9 | Fill #0

## 2019-12-14 MED FILL — ATORVASTATIN CALCIUM 80 MG: 80 | 30 days supply | Qty: 30 | Fill #0

## 2019-12-14 MED FILL — PHENYTOIN 125 MG/5 ML SUSP: 125 | 30 days supply | Qty: 690 | Fill #0

## 2019-12-14 MED FILL — SCOPOLAMINE 1 MG/3DAYS PT72: 1 | 30 days supply | Qty: 10 | Fill #0

## 2019-12-14 MED FILL — LANTUS 100 UNITS/ML VIAL: 100 | 28 days supply | Qty: 10 | Fill #0

## 2019-12-14 MED FILL — CLOPIDOGREL 75 MG TABLET: 75 | 30 days supply | Qty: 30 | Fill #0

## 2019-12-14 MED FILL — GABAPENTIN 100 MG CAPSULE: 100 | 30 days supply | Qty: 180 | Fill #0

## 2019-12-14 MED FILL — AMLODIPINE BESYLATE 10 MG T: 10 | 30 days supply | Qty: 30 | Fill #0

## 2019-12-14 MED FILL — ESCITALOPRAM 5 MG TABLET: 5 | 30 days supply | Qty: 30 | Fill #0

## 2019-12-14 MED FILL — DOXAZOSIN MESYLATE 2 MG TAB: 2 | 30 days supply | Qty: 90 | Fill #0

## 2019-12-14 MED FILL — VIMPAT 150 MG TABLET: 150 | 30 days supply | Qty: 60 | Fill #0

## 2019-12-14 MED FILL — ULTICARE INS 0.3 ML 30GX1/2: 30G X 1/2" | 30 days supply | Qty: 200 | Fill #0

## 2019-12-14 MED FILL — LEVETIRACETAM 100 MG/ML SOL: 100 | 23 days supply | Qty: 473 | Fill #0

## 2019-12-14 MED FILL — hydrALAZINE HCL 50 MG TABS: 50 | 30 days supply | Qty: 120 | Fill #0

## 2019-12-14 MED FILL — NovoLOG 100 UNIT/ML SOLN: 100 | 28 days supply | Qty: 10 | Fill #0

## 2019-12-14 NOTE — Progress Notes (Addendum)
Patient ID: Dashiell Franchino, male   DOB: [birth year], 59 y.o.   MRN: [id number]  Spoke with [name]-pt's other who is concerned regarding being medical ready for transfer tomorrow due to weekend issues. MD feels medically stable and will have Dan-PA call Rn and discuss concerns and answer his questions. Will send information to facility today.  12:23 PM Dan-PA spoke with [name]-brother and on go for tomorrow transfer. Have faxed information to [name]-notes, vitals and therapy notes along with DC summary.  12:30 PM Spoke with [name] to let her know fax information and to let this worker know if doesn;t receive. She reports RN needs to call report to [phone] RN station unsure which RN will have him.   1:50 pm Spoke with [facility] who reports they are set on their end crew will be Lottie Dawson and Loistine Simas will call once one hour away. Approx arrival 3:00 pm. Staff aware of this plan.

## 2019-12-14 NOTE — Progress Notes (Signed)
No acute changes noted in neuro assessment Q 2hr and recorded VS Q 2hrs  Recorded, denies pain assisted frequently throughout shift, Incontinent of bladder and bowel, incont, care provided. Repositioned at intervals as much as patient would allow. Refuse SCD;s and hand splint. Occasional impulsive episodes attempting to get out of bed, Monitor closely, no noted seizures or respiratory distress, Continue regime

## 2019-12-14 NOTE — Progress Notes (Signed)
Physical Therapy Session Note  Patient Details  Name: Abdo Denault MRN: [id number] Date of Birth: 1960/10/10  Today's Date: 12/14/2019 PT Missed Time: 45 Minutes Missed Time Reason: Patient fatigue;Patient unwilling to participate  Short Term Goals: Week 3:  PT Short Term Goal 1 (Week 3): STG = LTG due to estimated d/c date.  Skilled Therapeutic Interventions/Progress Updates:     Pt asleep in bed. PT provides sternal rub and attempts to wake pt multiple attempts. Pt opens eyes briefly and then closes again. PT to follow up.  Therapy Documentation Precautions:  Precautions Precautions: Fall Precaution Comments: PEG, decannulated on 12/04/2019 Restrictions Weight Bearing Restrictions: Yes   Therapy/Group: Individual Therapy  Beau Fanny, PT, DPT 12/14/2019, 3:57 PM

## 2019-12-14 NOTE — Progress Notes (Signed)
   12/14/19 1100  Vitals  Temp 98.1 F (36.7 C)  Temp Source Axillary  BP 139/67  BP Location Right Arm  BP Method Automatic  Patient Position (if appropriate) Lying  Pulse Rate 75  Pulse Rate Source Dinamap  Resp 16  MEWS COLOR  MEWS Score Color Green  Oxygen Therapy  SpO2 98 %  O2 Device Room Air  MEWS Score  MEWS Temp 0  MEWS Systolic 0  MEWS Pulse 0  MEWS RR 0  MEWS LOC 0  MEWS Score 0

## 2019-12-14 NOTE — Progress Notes (Signed)
Occupational Therapy Discharge Summary  Patient Details  Name: Corey Bennett MRN: [id number] Date of Birth: [birth year]  Today's Date: 12/14/2019   Patient has met 1 of 12 long term goals due to improved activity tolerance, improved balance and postural control.  Patient to discharge at overall Mod Assist level.  Patient is planning to DC to another Inpatient Rehab facility closer to family where he will continue to receive ongoing therapy services to maximize his independence and meet his physical and cognitive needs.   Reasons goals not met: Pt's goals set at supervision - Min A level, but pt limited by fatigue and medical complications and has shown minimal progress toward OT goals. Pt is overall Mod A with dressing/bathing and Mod-Max for toileting tasks. However, he is mostly Min-Mod with stand pivot transfers using RW.   Recommendation:  Patient will benefit from ongoing skilled OT services in inpatient rehab setting to continue to advance functional skills in the area of BADL and Reduce care partner burden.  Equipment: No equipment provided as pt will be discharging to another facility  Reasons for discharge: discharge from hospital  Patient/family agrees with progress made and goals achieved: Yes   OT Discharge Precautions/Restrictions  Precautions Precautions: Fall Precaution Comments: PEG, decannulated on 12/04/2019 Restrictions Weight Bearing Restrictions: No General OT Amount of Missed Time: 21 Minutes PT Missed Treatment Reason: Patient fatigue;Patient unwilling to participate Vital Signs Therapy Vitals Temp: (!) 97.4 F (36.3 C) Temp Source: Axillary Pulse Rate: 73 Resp: 17 BP: 134/60 Patient Position (if appropriate): Lying Oxygen Therapy SpO2: 98 % O2 Device: Room Air Pain Pain Assessment Pain Scale: 0-10 Pain Score: 2  ADL ADL Eating: NPO Grooming: Supervision/safety Where Assessed-Grooming: Sitting at sink Upper Body Bathing: Moderate  assistance Where Assessed-Upper Body Bathing: Edge of bed Lower Body Bathing: Moderate assistance Where Assessed-Lower Body Bathing: Edge of bed Upper Body Dressing: Moderate assistance Where Assessed-Upper Body Dressing: Sitting at sink Lower Body Dressing: Moderate assistance Where Assessed-Lower Body Dressing: Edge of bed Toileting: Maximal assistance Where Assessed-Toileting: Bedside Commode Toilet Transfer: Minimal assistance Toilet Transfer Method: Stand pivot Science writer: Bedside commode Vision Patient Visual Report: No change from baseline Perception  Perception: Impaired Cognition Overall Cognitive Status: Impaired/Different from baseline Arousal/Alertness: Awake/alert Orientation Level: Oriented to person Sustained Attention: Appears intact Awareness: Impaired Problem Solving: Impaired Behaviors: Poor frustration tolerance Safety/Judgment: Impaired Sensation Sensation Light Touch: Impaired Detail Coordination Gross Motor Movements are Fluid and Coordinated: No Fine Motor Movements are Fluid and Coordinated: No Coordination and Movement Description: R hemi Motor  Motor Motor: Hemiplegia;Abnormal tone;Abnormal postural alignment and control Motor - Skilled Clinical Observations: R hemi; generalized weakness Mobility  Bed Mobility Supine to Sit: Supervision/Verbal cueing Sit to Supine: Minimal Assistance - Patient > 75% Transfers Sit to Stand: Minimal Assistance - Patient > 75% Stand to Sit: Minimal Assistance - Patient > 75%  Trunk/Postural Assessment  Cervical Assessment Cervical Assessment: Exceptions to [facility] Thoracic Assessment Thoracic Assessment: Exceptions to [facility] Lumbar Assessment Lumbar Assessment: Exceptions to [facility] Postural Control Trunk Control: decreased trunk control but improved from eval - does lean/fall to R side and posteriorly Righting Reactions: delayed Protective Responses: delayed  Balance Balance Balance Assessed:  Yes Static Sitting Balance Static Sitting - Balance Support: No upper extremity supported;Feet supported Static Sitting - Level of Assistance: 5: Stand by assistance Dynamic Sitting Balance Dynamic Sitting - Balance Support: Feet supported;During functional activity;Right upper extremity supported Dynamic Sitting - Level of Assistance: 4: Min assist Static Standing Balance Static Standing - Balance  Support: Bilateral upper extremity supported;During functional activity Static Standing - Level of Assistance: 4: Min assist Dynamic Standing Balance Dynamic Standing - Balance Support: Bilateral upper extremity supported;During functional activity Dynamic Standing - Level of Assistance: 3: Mod assist Extremity/Trunk Assessment RUE Assessment RUE Assessment: Exceptions to [facility] General Strength Comments: limited AROM, difficulty to fully assess d/t difficulty following commands and pain in RUE with movement RUE Body System: Neuro RUE Tone RUE Tone Comments: hx of CVA impacting R side, continues to have atrophy and impairment in RUE LUE Assessment LUE Assessment: Within Functional Limits   Corey Bennett 12/14/2019, 4:54 PM

## 2019-12-14 NOTE — Progress Notes (Signed)
[place] PHYSICAL MEDICINE & REHABILITATION PROGRESS NOTE   Subjective/Complaints:  No further unresponsive episodes, reviewed labs   ROS: limited due to cognition and sedation  Objective:   DG Chest Port 1 View  Result Date: 12/13/2019 CLINICAL DATA:  59 year old male with acute respiratory distress. EXAM: PORTABLE CHEST 1 VIEW COMPARISON:  Chest radiograph dated 12/09/2019. FINDINGS: Mild diffuse interstitial coarsening may be chronic or mild edema. No focal consolidation, pleural effusion, pneumothorax. The cardiac silhouette is within limits. Atherosclerotic calcification of the aortic arch. No acute osseous pathology. IMPRESSION: No focal consolidation. Electronically Signed   By: Elgie Collard M.D.   On: 12/13/2019 18:07   No results for input(s): WBC, HGB, HCT, PLT in the last 72 hours. Recent Labs    12/12/19 0545 12/14/19 0643  NA 137 137  K 3.1* 3.4*  CL 100 101  CO2 27 26  GLUCOSE 108* 100*  BUN 35* 25*  CREATININE 1.27* 1.14  CALCIUM 8.4* 8.5*    Intake/Output Summary (Last 24 hours) at 12/14/2019 0848 Last data filed at 12/14/2019 0444 Gross per 24 hour  Intake 0 ml  Output --  Net 0 ml     Physical Exam: Vital Signs Blood pressure (!) 165/70, pulse 76, temperature 97.6 F (36.4 C), temperature source Oral, resp. rate 17, height 6\' 1"  (1.854 m), weight 70.5 kg, SpO2 99 %. Constitutional: pt asleep- woke briefly to stimuli, but not long, NAD HEENT: EOMI, oral membranes moist Neck: supple Cardiovascular: RRR Respiratory/Chest: CTA B/L- no W/R/R- good air movement GI/Abdomen: Soft, NT, ND, (+)BS  Ext: no clubbing, cyanosis, or edema Psych: pleasant but sedated/sleepy Neurologic: alert,    aphasic.   motor strength is 5/5 left deltoid, bicep, tricep, grip, hip flexor, knee extensors, ankle dorsiflexor and plantar flexor 3- RIght delt bi, tri, grip.  4-/5 Right HF, KE, ADF. RUE flexor tone still present. Minimal tone in RLE Musculoskeletal: RUE  minimally tender with PROM.    Assessment/Plan: 1. Functional deficits secondary to Left MCA infarct  which require 15/7 intensity of interdisciplinary therapy in a comprehensive inpatient rehab setting.  Physiatrist is providing close team supervision and 24 hour management of active medical problems listed below.  Physiatrist and rehab team continue to assess barriers to discharge/monitor patient progress toward functional and medical goals  Care Tool:  Bathing    Body parts bathed by patient: Chest, Abdomen, Face, Right upper leg, Left upper leg, Right arm, Buttocks, Front perineal area   Body parts bathed by helper: Left arm, Right lower leg, Left lower leg     Bathing assist Assist Level: Moderate Assistance - Patient 50 - 74%     Upper Body Dressing/Undressing Upper body dressing   What is the patient wearing?: Pull over shirt    Upper body assist Assist Level: Moderate Assistance - Patient 50 - 74%    Lower Body Dressing/Undressing Lower body dressing      What is the patient wearing?: Pants, Incontinence brief     Lower body assist Assist for lower body dressing: Moderate Assistance - Patient 50 - 74%     Toileting Toileting Toileting Activity did not occur 17/7 and hygiene only): N/A (no void or bm)  Toileting assist Assist for toileting: Maximal Assistance - Patient 25 - 49%     Transfers Chair/bed transfer  Transfers assist     Chair/bed transfer assist level: Minimal Assistance - Patient > 75%     Locomotion Ambulation   Ambulation assist   Ambulation activity  did not occur: Safety/medical concerns  Assist level: Minimal Assistance - Patient > 75% Assistive device: Walker-rolling Max distance: 50 ft   Walk 10 feet activity   Assist  Walk 10 feet activity did not occur: Safety/medical concerns  Assist level: Minimal Assistance - Patient > 75% Assistive device: Walker-rolling   Walk 50 feet activity   Assist Walk  50 feet with 2 turns activity did not occur: Safety/medical concerns  Assist level: Minimal Assistance - Patient > 75% Assistive device: Walker-rolling    Walk 150 feet activity   Assist Walk 150 feet activity did not occur: Safety/medical concerns  Assist level: 2 helpers Assistive device: Walker-rolling    Walk 10 feet on uneven surface  activity   Assist Walk 10 feet on uneven surfaces activity did not occur: Safety/medical concerns         Wheelchair     Assist Will patient use wheelchair at discharge?: Yes Type of Wheelchair: Manual Wheelchair activity did not occur: Safety/medical concerns         Wheelchair 50 feet with 2 turns activity    Assist    Wheelchair 50 feet with 2 turns activity did not occur: Safety/medical concerns       Wheelchair 150 feet activity     Assist  Wheelchair 150 feet activity did not occur: Safety/medical concerns       Blood pressure (!) 165/70, pulse 76, temperature 97.6 F (36.4 C), temperature source Oral, resp. rate 17, height 6\' 1"  (1.854 m), weight 70.5 kg, SpO2 99 %.    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 Antithrombotics: -DVT/anticoagulation:SCDs -antiplatelet therapy: Aspirin 81 mg daily and Plavix 75 mg daily 3. Pain Management:Tylenol as needed  -  Gabapentin increased to 200mg  tid for neuropathic pain RUE   -support hemiplegic shoulder  -tone mgt  -pain better controlled 10/7  -10/8 will add low dose baclofen (5mg  bid initially) for RUE flexor tone  Off baclofen due to suspected seizure, has mild/mod RUE and RLE flexor tone 4. Mood:Provide emotional support -antipsychotic agents: Seroquel 50 mg nightly 5. Neuropsych:  This patientis notcapable of making decisions on hisown behalf.  -continue sleep chart to measure sleep hygiene 6. Skin/Wound Care:Routine skin checks 7. Fluids/Electrolytes/Nutrition:BUN/Cr trending up with recent TF issues, stable today but remain elevated  Off iVF and cont TF boluses, K+ boderline low with give KCL supplement 12/7 x 1 today VT 8. Seizure disorder. Dilantin 175 mg 3 times daily, Keppra 1000 mg twice daily, Vimpat 150 mg twice daily. outpt neuro f/u  -10/8 corrected dilantin level 22.7, spoke with pharmD and will reduce dilantin to 175mg  tid 9. Diabetes mellitus. Hemoglobin A1c 7.2. Lantus insulin 6 units nightly, NovoLog 2 units every 4 hours.    CBG (last 3)  Recent Labs    12/13/19 2011 12/13/19 2334 12/14/19 0424  GLUCAP 176* 198* 90   10/8: low cbg's early this morning   -continue increased novolog to 3u scheduled 6x day to coincide with boluses  10/10- BGs controlled con't regimen  10. Dysphagia. NPO except for trials with SLP. Status post gastrostomy tube 11/10/2019 per interventional radiology. advance per SLP.  -resumed boluses 10/7 11. Hypertension. Hydralazine 50 mg every 8 hours, Cardura 4 mg daily, Norvasc 10 mg daily.    Increase hydralazine to q 6 h Vitals:  12/14/19 0423 12/14/19 0612  BP: (!) 157/69 (!) 165/70  Pulse: 77 76  Resp: 18 17  Temp: 97.8 F (36.6 C) 97.6 F (36.4 C)  SpO2: 97% 99%    12. Acute on chronic respiratory failure. Status post tracheostomy tube. Presently with a #6 Shiley flexible cuffless trach as of 11/02/2019 followed by pulmonary services Dr. Levon Hedger  -pt decannulated and tolerating well  -occlusive dressing/pressure to stoma site 13. Hyperlipidemia. Lipitor 14. Urinary retention. ongoing large volume retention    -doxasosin  2mg ---increased to 4mg  9/30--will increase to 6mg  tonight 10/8   -continue I/o cath prn to keep volumes less than 500cc   -increased urecholine to 50mg  tid 10/4-  will not increase further at this point. Pt apparently with retention prior to this admit.  15. Nausea/vomiting   -improved. Suspect that it was d/t constipation  -tolerating boluses now--titrate up slowly per ID 16. Enterococcus UTI:  Complete tx with amox 500mg  q 8hr for 2 more days  LOS: 23 days A FACE TO FACE EVALUATION WAS PERFORMED  12/14/2019, 8:48 AM

## 2019-12-14 NOTE — Progress Notes (Signed)
Speech Language Pathology Daily Session Note  Patient Details  Name: Corey Bennett MRN: [id number] Date of Birth: [birth year]  Today's Date: 12/14/2019 SLP Individual Time: 0725-0750 SLP Individual Time Calculation (min): 25 min  Short Term Goals: Week 4: SLP Short Term Goal 1 (Week 4): STGs=LTGs due to ELOS  Skilled Therapeutic Interventions: Skilled treatment session focused on communication goals. Upon arrival, patient was awake but appeared lethargic. Patient with decreased eye contact today and required extra time for responses to questions. Patient attempting to communicate at the phrase level but it was essentially unintelligible. Patient required total A for thoroughness during oral care and Max A multimodal cues to perform 10 repetitions of EMST accurately. Patient left upright in bed with alarm on and all needs within reach. Continue with current plan of care.      Pain No/Denies Pain   Therapy/Group: Individual Therapy  Luka Stohr 12/14/2019, 12:22 PM

## 2019-12-14 NOTE — Discharge Instructions (Signed)
Inpatient Rehab Discharge Instructions  Corey Bennett Discharge date and time: No discharge date for patient encounter.   Activities/Precautions/ Functional Status: Activity: activity as tolerated and free water 150 mg every 4 hours by tube Diet: Osmolite 237 mL 6 times daily Wound Care: Routine skin checks Functional status:  ___ No restrictions     ___ Walk up steps independently ___ 24/7 supervision/assistance   ___ Walk up steps with assistance ___ Intermittent supervision/assistance  ___ Bathe/dress independently ___ Walk with walker     _x__ Bathe/dress with assistance ___ Walk Independently    ___ Shower independently ___ Walk with assistance    ___ Shower with assistance ___ No alcohol     ___ Return to work/school ________  Special Instructions: No driving smoking or alcohol    STROKE/TIA DISCHARGE INSTRUCTIONS SMOKING Cigarette smoking nearly doubles your risk of having a stroke & is the single most alterable risk factor  If you smoke or have smoked in the last 12 months, you are advised to quit smoking for your health.  Most of the excess cardiovascular risk related to smoking disappears within a year of stopping.  Ask you doctor about anti-smoking medications  [place] Quit Line: 1-800-QUIT NOW  Free Smoking Cessation Classes [phone]  CHOLESTEROL Know your levels; limit fat & cholesterol in your diet  Lipid Panel     Component Value Date/Time   TRIG 96 10/21/2019 0835      Many patients benefit from treatment even if their cholesterol is at goal.  Goal: Total Cholesterol (CHOL) less than 160  Goal:  Triglycerides (TRIG) less than 150  Goal:  HDL greater than 40  Goal:  LDL (LDLCALC) less than 100   BLOOD PRESSURE American Stroke Association blood pressure target is less that 120/80 mm/Hg  Your discharge blood pressure is:  BP: (!) 134/56  Monitor your blood pressure  Limit your salt and alcohol intake  Many individuals will require more than one  medication for high blood pressure  DIABETES (A1c is a blood sugar average for last 3 months) Goal HGBA1c is under 7% (HBGA1c is blood sugar average for last 3 months)  Diabetes:    Lab Results  Component Value Date   HGBA1C 6.3 (H) 11/17/2019     Your HGBA1c can be lowered with medications, healthy diet, and exercise.  Check your blood sugar as directed by your physician  Call your physician if you experience unexplained or low blood sugars.  PHYSICAL ACTIVITY/REHABILITATION Goal is 30 minutes at least 4 days per week  Activity: Increase activity slowly, Therapies: Physical Therapy: Home Health Return to work:   Activity decreases your risk of heart attack and stroke and makes your heart stronger.  It helps control your weight and blood pressure; helps you relax and can improve your mood.  Participate in a regular exercise program.  Talk with your doctor about the best form of exercise for you (dancing, walking, swimming, cycling).  DIET/WEIGHT Goal is to maintain a healthy weight  Your discharge diet is:  Diet Order            Diet NPO time specified  Diet effective midnight                 liquids Your height is:  Height: 6\' 1"  (185.4 cm) Your current weight is: Weight: 75.4 kg Your Body Mass Index (BMI) is:  BMI (Calculated): 21.94  Following the type of diet specifically designed for you will help prevent another stroke.  Your goal weight range is:    Your goal Body Mass Index (BMI) is 19-24.  Healthy food habits can help reduce 3 risk factors for stroke:  High cholesterol, hypertension, and excess weight.  RESOURCES Stroke/Support Group:  Call [phone]   STROKE EDUCATION PROVIDED/REVIEWED AND GIVEN TO PATIENT Stroke warning signs and symptoms How to activate emergency medical system (call 911). Medications prescribed at discharge. Need for follow-up after discharge. Personal risk factors for stroke. Pneumonia vaccine given:  Flu vaccine given:  My  questions have been answered, the writing is legible, and I understand these instructions.  I will adhere to these goals & educational materials that have been provided to me after my discharge from the hospital.      My questions have been answered and I understand these instructions. I will adhere to these goals and the provided educational materials after my discharge from the hospital.  Patient/Caregiver Signature _______________________________ Date __________  Clinician Signature _______________________________________ Date __________  Please bring this form and your medication list with you to all your follow-up doctor's appointments.

## 2019-12-14 NOTE — Progress Notes (Signed)
Occupational Therapy Session Note  Patient Details  Name: Corey Bennett MRN: [id number] Date of Birth: 10-02-1960  Today's Date: 12/14/2019 OT Individual Time: 1531-1610 OT Individual Time Calculation (min): 39 min  and Today's Date: 12/14/2019 OT Missed Time: 21 Minutes Missed Time Reason: Patient fatigue   Short Term Goals: Week 3:  OT Short Term Goal 1 (Week 3): STGs = LTGs  Skilled Therapeutic Interventions/Progress Updates:    Pt greeted at time of session supine in bed resting, RN recently gave bolus as his sugars were low. Pt agreeable to OT session with encouragement, supine to sit EOB supervision and doffed shirt with Min A, performed UB/LB bathing Mod A with assist for thoroughness, sequencing. Pt declined to wash periarea/buttocks and past knee level today. Pt with inconsistent answers to questions, sometimes answering yes and no to the same question, attempts to use simple communication board with pt not wanting to use at times. Donned shirt with Mod A, able to thread pants over L foot but did not use hemidressing techniques despite education, threading RLE after left. Sit to stand Mod A today despite scooting towards EOB and cues to push up from surface, once standing, static standing balance Min but when helping don pants over hips pt needed Mod A for standing balance, returned to sitting with Min/Mod A to control descent. Pt anxious and required time to calm down before sit to supine Min A. Brief PROM for RUE for digits composite and individual, FA supination/pronation, elbow flex/ext, and shoulder flexion/extension all within his tolerance. Alarm on, call bell in reach. Pt very fatigued and did not want to continue OT session after ADL, missed 21 mins d/t fatigue.  Therapy Documentation Precautions:  Precautions Precautions: Fall Precaution Comments: PEG, decannulated on 12/04/2019 Restrictions Weight Bearing Restrictions: No     Therapy/Group: Individual Therapy  [name] Score 12/14/2019, 4:56 PM

## 2019-12-14 NOTE — Progress Notes (Signed)
Patient CBG @ 1519 69. Bolus feed given. Recheck CBG @ 109. Patient shows no signs and symptoms of any hypoglycemic effects. No complications noted at this time.  Jay Schlichter, LPN

## 2019-12-14 NOTE — Progress Notes (Signed)
°   12/14/19 0900  Vitals  Temp 98.3 F (36.8 C)  Temp Source Oral  BP (!) 149/66  MAP (mmHg) 90  BP Location Right Arm  BP Method Automatic  Patient Position (if appropriate) Lying  Pulse Rate 74  Pulse Rate Source Dinamap  Resp 17  Level of Consciousness  Level of Consciousness Alert  MEWS COLOR  MEWS Score Color Green  Oxygen Therapy  SpO2 98 %  O2 Device Room Air  MEWS Score  MEWS Temp 0  MEWS Systolic 0  MEWS Pulse 0  MEWS RR 0  MEWS LOC 0  MEWS Score 0

## 2019-12-15 ENCOUNTER — Encounter: Payer: BC Managed Care – PPO | Admitting: Occupational Therapy

## 2019-12-15 ENCOUNTER — Other Ambulatory Visit (HOSPITAL_COMMUNITY): Payer: Self-pay | Admitting: Physician Assistant

## 2019-12-15 LAB — GLUCOSE, CAPILLARY
Glucose-Capillary: 111 mg/dL — ABNORMAL HIGH (ref 70–99)
Glucose-Capillary: 149 mg/dL — ABNORMAL HIGH (ref 70–99)
Glucose-Capillary: 149 mg/dL — ABNORMAL HIGH (ref 70–99)
Glucose-Capillary: 158 mg/dL — ABNORMAL HIGH (ref 70–99)
Glucose-Capillary: 173 mg/dL — ABNORMAL HIGH (ref 70–99)

## 2019-12-15 MED ORDER — POTASSIUM CHLORIDE ER 10 MEQ PO TBCR: 10.0000 meq | EXTENDED_RELEASE_TABLET | Freq: Every day | ORAL | 0 refills | Status: AC

## 2019-12-15 MED FILL — POTASSIUM CHL ER M10 TABLET: 10 | 30 days supply | Qty: 30 | Fill #0

## 2019-12-15 NOTE — Plan of Care (Signed)
  Problem: Consults Goal: RH STROKE PATIENT EDUCATION Description: See Patient Education module for education specifics  Outcome: Completed/Met   Problem: RH BOWEL ELIMINATION Goal: RH STG MANAGE BOWEL WITH ASSISTANCE Description: STG Manage Bowel with min Assistance. Outcome: Completed/Met   Problem: RH BLADDER ELIMINATION Goal: RH STG MANAGE BLADDER WITH ASSISTANCE Description: STG Manage Bladder With min Assistance Outcome: Completed/Met Goal: RH STG MANAGE BLADDER WITH MEDICATION WITH ASSISTANCE Description: STG Manage Bladder With Medication With min Assistance. Outcome: Completed/Met   Problem: RH SKIN INTEGRITY Goal: RH STG SKIN FREE OF INFECTION/BREAKDOWN Description: No new skin breakdown this admission Outcome: Completed/Met Goal: RH STG MAINTAIN SKIN INTEGRITY WITH ASSISTANCE Description: STG Maintain Skin Integrity With min Assistance. Outcome: Completed/Met   Problem: RH SAFETY Goal: RH STG ADHERE TO SAFETY PRECAUTIONS W/ASSISTANCE/DEVICE Description: STG Adhere to Safety Precautions With min Assistance/Device. Outcome: Completed/Met Goal: RH STG DECREASED RISK OF FALL WITH ASSISTANCE Description: STG Decreased Risk of Fall With min Assistance. Outcome: Completed/Met   Problem: RH PAIN MANAGEMENT Goal: RH STG PAIN MANAGED AT OR BELOW PT'S PAIN GOAL Description: Less than 3 out of 10 Outcome: Completed/Met   Problem: RH KNOWLEDGE DEFICIT Goal: RH STG INCREASE KNOWLEDGE OF DIABETES Description: Patient will demonstrate knowledge of diabetes medications, diabetes diet, blood sugar parameters, and follow up care with the MD with min assist from the CIR staff.  Outcome: Completed/Met Goal: RH STG INCREASE KNOWLEDGE OF HYPERTENSION Description: Patient will demonstrate knowledge of HTN medications, HTN diet, blood pressure parameters, and follow up care with the MD with min assist from the [facility] staff.   Outcome: Completed/Met Goal: RH STG INCREASE KNOWLEDGE OF  DYSPHAGIA/FLUID INTAKE Description: Patient and family will be able to manage Dysphagia with dietary restriction using handouts and educational materials with min assist  Outcome: Completed/Met Goal: RH STG INCREASE KNOWLEGDE OF HYPERLIPIDEMIA Description: Patient will demonstrate knowledge of HLD medications, and follow up care with the MD with min assist from the CIR staff. Outcome: Completed/Met Goal: RH STG INCREASE KNOWLEDGE OF STROKE PROPHYLAXIS Description: Patient will demonstrate knowledge of secondary medications for prevention of future strokes, and follow up care with the MD with min assist from the [facility] staff.  Outcome: Completed/Met

## 2019-12-15 NOTE — Progress Notes (Signed)
Report called to transfer facility. Report given to [facility].

## 2019-12-15 NOTE — Progress Notes (Signed)
Transport arrived for pt. Report given to transport nurse. Pt belongings sent with transport.

## 2019-12-15 NOTE — Progress Notes (Signed)
[facility] PHYSICAL MEDICINE & REHABILITATION PROGRESS NOTE   Subjective/Complaints: Sleeping this morning. Plan for DC today Nursing reports no issues CXR 10/10 stable  ROS: limited due to cognition and sedation  Objective:   DG Chest Port 1 View  Result Date: 12/13/2019 CLINICAL DATA:  59 year old male with acute respiratory distress. EXAM: PORTABLE CHEST 1 VIEW COMPARISON:  Chest radiograph dated 12/09/2019. FINDINGS: Mild diffuse interstitial coarsening may be chronic or mild edema. No focal consolidation, pleural effusion, pneumothorax. The cardiac silhouette is within limits. Atherosclerotic calcification of the aortic arch. No acute osseous pathology. IMPRESSION: No focal consolidation. Electronically Signed   By: Elgie Collard M.D.   On: 12/13/2019 18:07   No results for input(s): WBC, HGB, HCT, PLT in the last 72 hours. Recent Labs    12/14/19 0643  NA 137  K 3.4*  CL 101  CO2 26  GLUCOSE 100*  BUN 25*  CREATININE 1.14  CALCIUM 8.5*   No intake or output data in the 24 hours ending 12/15/19 1201   Physical Exam: Vital Signs Blood pressure (!) 154/59, pulse 83, temperature 98.3 F (36.8 C), resp. rate 17, height 6\' 1"  (1.854 m), weight 70.8 kg, SpO2 98 %. General: Somnolent, No apparent distress HEENT: Head is normocephalic, atraumatic, PERRLA, EOMI, sclera anicteric, oral mucosa pink and moist, dentition intact, ext ear canals clear,  Neck: Supple without JVD or lymphadenopathy Heart: Reg rate and rhythm. No murmurs rubs or gallops Chest: CTA bilaterally without wheezes, rales, or rhonchi; no distress Abdomen: Soft, non-tender, non-distended, bowel sounds positive. Extremities: No clubbing, cyanosis, or edema. Pulses are 2+ Psych: pleasant but sedated/sleepy Neurologic: alert,    aphasic.   motor strength is 5/5 left deltoid, bicep, tricep, grip, hip flexor, knee extensors, ankle dorsiflexor and plantar flexor 3- RIght delt bi, tri, grip.  4-/5 Right HF, KE,  ADF. RUE flexor tone still present. Minimal tone in RLE Musculoskeletal: RUE minimally tender with PROM.   Assessment/Plan: 1. Functional deficits secondary to Left MCA infarct  which require 15/7 intensity of interdisciplinary therapy in a comprehensive inpatient rehab setting.  Physiatrist is providing close team supervision and 24 hour management of active medical problems listed below.  Physiatrist and rehab team continue to assess barriers to discharge/monitor patient progress toward functional and medical goals  Care Tool:  Bathing    Body parts bathed by patient: Chest, Abdomen, Face, Right upper leg, Left upper leg, Right arm, Buttocks, Front perineal area   Body parts bathed by helper: Left arm, Right lower leg, Left lower leg     Bathing assist Assist Level: Moderate Assistance - Patient 50 - 74%     Upper Body Dressing/Undressing Upper body dressing   What is the patient wearing?: Pull over shirt    Upper body assist Assist Level: Moderate Assistance - Patient 50 - 74%    Lower Body Dressing/Undressing Lower body dressing      What is the patient wearing?: Pants     Lower body assist Assist for lower body dressing: Moderate Assistance - Patient 50 - 74%     Toileting Toileting Toileting Activity did not occur 17/7 and hygiene only): N/A (no void or bm)  Toileting assist Assist for toileting: Maximal Assistance - Patient 25 - 49%     Transfers Chair/bed transfer  Transfers assist     Chair/bed transfer assist level: Minimal Assistance - Patient > 75%     Locomotion Ambulation   Ambulation assist   Ambulation activity did not  occur: Safety/medical concerns  Assist level: Minimal Assistance - Patient > 75% Assistive device: Walker-rolling Max distance: 50 ft   Walk 10 feet activity   Assist  Walk 10 feet activity did not occur: Safety/medical concerns  Assist level: Minimal Assistance - Patient > 75% Assistive device:  Walker-rolling   Walk 50 feet activity   Assist Walk 50 feet with 2 turns activity did not occur: Safety/medical concerns  Assist level: Minimal Assistance - Patient > 75% Assistive device: Walker-rolling    Walk 150 feet activity   Assist Walk 150 feet activity did not occur: Safety/medical concerns  Assist level: 2 helpers Assistive device: Walker-rolling    Walk 10 feet on uneven surface  activity   Assist Walk 10 feet on uneven surfaces activity did not occur: Safety/medical concerns         Wheelchair     Assist Will patient use wheelchair at discharge?: Yes Type of Wheelchair: Manual Wheelchair activity did not occur: Safety/medical concerns         Wheelchair 50 feet with 2 turns activity    Assist    Wheelchair 50 feet with 2 turns activity did not occur: Safety/medical concerns       Wheelchair 150 feet activity     Assist  Wheelchair 150 feet activity did not occur: Safety/medical concerns       Blood pressure (!) 154/59, pulse 83, temperature 98.3 F (36.8 C), resp. rate 17, height 6\' 1"  (1.854 m), weight 70.8 kg, SpO2 98 %.    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 Antithrombotics: -DVT/anticoagulation:SCDs -antiplatelet therapy: Aspirin 81 mg daily and Plavix 75 mg daily 3. Pain Management:Tylenol as needed  -  Gabapentin increased to 200mg  tid for neuropathic pain RUE   -support hemiplegic shoulder  -tone mgt  -pain better controlled 10/7  -10/8 will add low dose baclofen (5mg  bid initially) for RUE flexor tone  Off baclofen due to suspected seizure, has mild/mod RUE and RLE flexor tone  Well controlled 4. Mood:Provide emotional support -antipsychotic  agents: Seroquel 50 mg nightly 5. Neuropsych: This patientis notcapable of making decisions on hisown behalf.  -continue sleep chart to measure sleep hygiene 6. Skin/Wound Care:Routine skin checks 7. Fluids/Electrolytes/Nutrition:BUN/Cr trending up with recent TF issues, stable today but remain elevated  Off iVF and cont TF boluses, K+ boderline low with give KCL supplement 12/7 x 1 today VT 8. Seizure disorder. Dilantin 175 mg 3 times daily, Keppra 1000 mg twice daily, Vimpat 150 mg twice daily. outpt neuro f/u  -10/8 corrected dilantin level 22.7, spoke with pharmD and will reduce dilantin to 175mg  tid 9. Diabetes mellitus. Hemoglobin A1c 7.2. Lantus insulin 6 units nightly, NovoLog 2 units every 4 hours.    CBG (last 3)  Recent Labs    12/15/19 0426 12/15/19 0747 12/15/19 1111  GLUCAP 111* 149* 149*   10/8: low cbg's early this morning   -continue increased novolog to 3u scheduled 6x day to coincide with boluses  10/10- BGs controlled con't regimen  10/12: slightly elevated.   10. Dysphagia. NPO except for trials with SLP. Status post gastrostomy tube 11/10/2019 per interventional radiology. advance per SLP.  -resumed boluses 10/7 11. Hypertension. Hydralazine 50 mg every 8 hours, Cardura 4 mg daily, Norvasc 10 mg daily.    Increase hydralazine to q 6  h Vitals:   12/15/19 0000 12/15/19 0431  BP: (!) 151/63 (!) 154/59  Pulse: 84 83  Resp: 20 17  Temp: 98.9 F (37.2 C) 98.3 F (36.8 C)  SpO2: 100% 98%    12. Acute on chronic respiratory failure. Status post tracheostomy tube. Presently with a #6 Shiley flexible cuffless trach as of 11/02/2019 followed by pulmonary services Dr. Levon Hedger  -pt decannulated and tolerating well  -occlusive dressing/pressure to stoma site 13. Hyperlipidemia. Lipitor 14. Urinary retention. ongoing large volume retention    -doxasosin  2mg ---increased to 4mg  9/30--will increase to 6mg  tonight 10/8   -continue I/o cath  prn to keep volumes less than 500cc   -increased urecholine to 50mg  tid 10/4- will not increase further at this point. Pt apparently with retention prior to this admit.  15. Nausea/vomiting   -improved Suspect that it was d/t constipation  -tolerating boluses now--titrate up slowly per ID 16. Enterococcus UTI:  Complete tx with amox 500mg  q 8hr    >30 minutes spent in discharge of patient including review of medications and follow-up appointments, physical examination, and in answering all patient's questions  LOS: 24 days A FACE TO FACE EVALUATION WAS PERFORMED  Kaide Gage P Khyle Goodell 12/15/2019, 12:01 PM

## 2019-12-15 NOTE — Progress Notes (Signed)
Patient ID: Corey Bennett, male   DOB: Jan 16, 1961, 59 y.o.   MRN: [id number]  SW faxed updated d/c summary to [name]/RN Coordinator with [facility] ([phone]/f:[phone]). SW provided updates to [place]. D/c packet provided to pt assigned RN. D/c remains scheduled for 3pm pickup. Pt provided 6 carts of Osmolite 1.5 for transport.   Cecile Sheerer, MSW, LCSWA Office: [phone] Cell: [phone] Fax: [phone]

## 2019-12-15 NOTE — Progress Notes (Signed)
Physical Therapy Discharge Summary  Patient Details  Name: Corey Bennett MRN: [id number] Date of Birth: 1960-09-11   Patient has met 0 of 7 long term goals, making some progress but with decline in participation toward end of rehab stay and failure to meet goals, but did demonstrate improved activity tolerance, improved balance and increased strength.  Patient to discharge at a wheelchair level Mod Assist.   Patient DCing to CIR in home state closer to family.   Reasons goals not met: Pt had inconsistent participation with therapy and had decline in overall participation toward end of rehab stay, and is [place] CIR [place].   Recommendation:  Patient will benefit from ongoing skilled PT services in inpatient setting to continue to advance safe functional mobility, address ongoing impairments in strength, balance, cognition, ambulation, and minimize fall risk.  Equipment: No equipment provided  Reasons for discharge: discharge from hospital  Patient/family agrees with progress made and goals achieved: Yes  PT Discharge Precautions/Restrictions Precautions Precautions: Fall Restrictions Weight Bearing Restrictions: No Vision/Perception  Perception Perception: Impaired Inattention/Neglect: Does not attend to right side of body Praxis Praxis: Intact  Cognition Overall Cognitive Status: Impaired/Different from baseline Arousal/Alertness: Lethargic Orientation Level: Oriented to person Safety/Judgment: Impaired Sensation Sensation Light Touch: Impaired Detail Light Touch Impaired Details: Impaired RUE;Impaired RLE Coordination Gross Motor Movements are Fluid and Coordinated: No Fine Motor Movements are Fluid and Coordinated: No Coordination and Movement Description: R hemi Motor  Motor Motor: Hemiplegia;Abnormal tone;Abnormal postural alignment and control Motor - Skilled Clinical Observations: R hemi; generalized weakness  Mobility Bed Mobility Bed  Mobility: Rolling Right;Rolling Left;Supine to Sit;Sit to Supine Rolling Right: Minimal Assistance - Patient > 75% Rolling Left: Minimal Assistance - Patient > 75% Supine to Sit: Supervision/Verbal cueing Sit to Supine: Minimal Assistance - Patient > 75% Transfers Transfers: Sit to Stand;Stand to Sit;Stand Pivot Transfers Sit to Stand: Minimal Assistance - Patient > 75% Stand to Sit: Minimal Assistance - Patient > 75% Stand Pivot Transfers: Minimal Assistance - Patient > 75% Stand Pivot Transfer Details: Tactile cues for initiation;Tactile cues for sequencing;Tactile cues for placement;Verbal cues for sequencing;Verbal cues for technique;Verbal cues for precautions/safety;Verbal cues for safe use of DME/AE;Manual facilitation for placement Transfer (Assistive device): Rolling walker Locomotion  Gait Ambulation: Yes Gait Assistance: Minimal Assistance - Patient > 75% Gait Distance (Feet): 100 Feet Assistive device: Rolling walker Gait Assistance Details: Tactile cues for posture;Verbal cues for sequencing;Verbal cues for technique;Verbal cues for safe use of DME/AE;Tactile cues for weight shifting Gait Gait: Yes Gait Pattern: Impaired Gait Pattern: Step-through pattern;Ataxic;Trunk flexed;Scissoring Gait velocity: reduced Stairs / Additional Locomotion Stairs: No Wheelchair Mobility Wheelchair Mobility: No  Trunk/Postural Assessment  Cervical Assessment Cervical Assessment:  (forward head) Thoracic Assessment Thoracic Assessment:  (rounded shoulders) Lumbar Assessment Lumbar Assessment:  (posterior pelvic tilt) Postural Control Postural Control: Deficits on evaluation Trunk Control: decreased trunk control but improved from eval - does lean/fall to R side and posteriorly Righting Reactions: delayed Protective Responses: delayed  Balance Balance Balance Assessed: Yes Static Sitting Balance Static Sitting - Balance Support: No upper extremity supported;Feet supported Static  Sitting - Level of Assistance: 5: Stand by assistance Dynamic Sitting Balance Dynamic Sitting - Balance Support: Feet supported;During functional activity;Right upper extremity supported Dynamic Sitting - Level of Assistance: 4: Min Oncologist Standing - Balance Support: Bilateral upper extremity supported;During functional activity Static Standing - Level of Assistance: 4: Min assist Dynamic Standing Balance Dynamic Standing - Balance Support: Bilateral upper extremity supported;During functional  activity Dynamic Standing - Level of Assistance: 4: Min assist Extremity Assessment      RLE Assessment RLE Assessment: Exceptions to [facility] General Strength Comments: 3 to 4/5 grossly LLE Assessment LLE Assessment: Within Functional Limits General Strength Comments: 4 to 5/5 grossly    Breck Coons, PT, DPT 12/15/2019, 4:44 PM

## 2019-12-15 NOTE — Progress Notes (Signed)
Speech Language Pathology Discharge Summary  Patient Details  Name: Corey Bennett MRN: [id number] Date of Birth: 21-Mar-1960  Patient has met 0 of 6 long term goals.  Patient to discharge at overall Mod;Max level.   Reasons goals not met: Patient remains NPO and requires overall Mod-Max A for use of multimodal communication   Clinical Impression/Discharge Summary: Patient has made minimal and inconsistent gains this admission. Currently, patient continues to demonstrate overt s/s of aspiration at the bedside with trials of nectar-thick liquids with Mod verbal and visual cues needed for use of a chin tuck. Patient will need a repeat MBS prior to diet initiation due to severe oropharyngeal dysphagia. Patient continues to demonstrate a severe aphasia which is exacerbated by apraxia requiring overall Mod-Max A cues for use of multimodal communication to express basic wants/needs. Patient remains essentially unintelligible at the phrase level during spontaneous verbalizations but can read aloud with extra time and overall Min A. Patient's overall function has also been impacted by fatigue and lethargy. Education is complete and patient will discharge to another inpatient rehab facility. Recommend f/u SLP services to maximize his cognitive-linguistic and swallowing function in order to reduce caregiver burden.   Care Partner:  Caregiver Able to Provide Assistance: No  Type of Caregiver Assistance: Physical;Cognitive  Recommendation:  24 hour supervision/assistance (inpatient rehab)  Rationale for SLP Follow Up: Maximize cognitive function and independence;Reduce caregiver burden;Maximize functional communication;Maximize swallowing safety   Equipment: Suction, EMST device   Reasons for discharge: Discharged from hospital   Patient/Family Agrees with Progress Made and Goals Achieved: Yes    Corey Bennett, [name] 12/15/2019, 6:16 AM

## 2019-12-15 NOTE — Progress Notes (Signed)
Pt is alert and responsive. No complaints of pain or signs of distress. It has been over 24 hours since unresponsive episode. Pt has been stable and vital signs are within range. There is no order to continue q2 neuro checks and q2 vital signs and mews protocol was followed for over 24 hours. Pt is refusing splints as well. Will make frequent checks on pt throughout night.   Maxima Skelton LPN.

## 2019-12-16 NOTE — Progress Notes (Signed)
Inpatient Rehabilitation Care Coordinator  Discharge Note  The overall goal for the admission was met for:   Discharge location: Yes. Pt discharged to [facility] (CM LACYp:[phone]/f:[phone]) in [place] for continued therapy.  Length of Stay: Yes. 24 days.   Discharge activity level: Yes. Mod A  Home/community participation: Yes. Limited.  Services provided included: MD, RD, PT, OT, SLP, RN, CM, TR, Pharmacy, Neuropsych and SW  Financial Services: Private Insurance: [place]  Follow-up services arranged: Other: [facility]  Comments (or additional information): contact pt brother  Chriss Czar [phone] or mother Vergia Alcon [phone]  Patient/Family verbalized understanding of follow-up arrangements: Yes  Individual responsible for coordination of the follow-up plan: N/A  Confirmed correct DME delivered: Rana Snare 12/16/2019    Rana Snare

## 2019-12-17 ENCOUNTER — Encounter: Payer: BC Managed Care – PPO | Admitting: Occupational Therapy

## 2019-12-22 ENCOUNTER — Encounter: Payer: BC Managed Care – PPO | Admitting: Occupational Therapy

## 2019-12-24 ENCOUNTER — Ambulatory Visit: Payer: BC Managed Care – PPO | Admitting: Occupational Therapy

## 2019-12-29 ENCOUNTER — Encounter: Payer: BC Managed Care – PPO | Admitting: Occupational Therapy

## 2019-12-31 ENCOUNTER — Encounter: Payer: BC Managed Care – PPO | Admitting: Occupational Therapy
